# Patient Record
Sex: Female | Born: 1940
Health system: Southern US, Community
[De-identification: ages and names within clinical notes are randomized; demographics above are authoritative.]

## PROBLEM LIST (undated history)

## (undated) DIAGNOSIS — R42 Dizziness and giddiness: Secondary | ICD-10-CM

## (undated) DIAGNOSIS — J189 Pneumonia, unspecified organism: Secondary | ICD-10-CM

## (undated) DIAGNOSIS — R519 Headache, unspecified: Secondary | ICD-10-CM

## (undated) DIAGNOSIS — Z9289 Personal history of other medical treatment: Secondary | ICD-10-CM

## (undated) DIAGNOSIS — K922 Gastrointestinal hemorrhage, unspecified: Secondary | ICD-10-CM

## (undated) DIAGNOSIS — D649 Anemia, unspecified: Secondary | ICD-10-CM

## (undated) DIAGNOSIS — R413 Other amnesia: Secondary | ICD-10-CM

## (undated) DIAGNOSIS — I499 Cardiac arrhythmia, unspecified: Secondary | ICD-10-CM

## (undated) DIAGNOSIS — H9190 Unspecified hearing loss, unspecified ear: Secondary | ICD-10-CM

## (undated) DIAGNOSIS — E0590 Thyrotoxicosis, unspecified without thyrotoxic crisis or storm: Secondary | ICD-10-CM

## (undated) DIAGNOSIS — R55 Syncope and collapse: Secondary | ICD-10-CM

## (undated) DIAGNOSIS — K219 Gastro-esophageal reflux disease without esophagitis: Secondary | ICD-10-CM

## (undated) DIAGNOSIS — M1990 Unspecified osteoarthritis, unspecified site: Secondary | ICD-10-CM

## (undated) DIAGNOSIS — G4730 Sleep apnea, unspecified: Secondary | ICD-10-CM

## (undated) DIAGNOSIS — C439 Malignant melanoma of skin, unspecified: Secondary | ICD-10-CM

## (undated) DIAGNOSIS — R51 Headache: Secondary | ICD-10-CM

## (undated) DIAGNOSIS — E785 Hyperlipidemia, unspecified: Secondary | ICD-10-CM

## (undated) DIAGNOSIS — I10 Essential (primary) hypertension: Secondary | ICD-10-CM

## (undated) DIAGNOSIS — D62 Acute posthemorrhagic anemia: Secondary | ICD-10-CM

## (undated) HISTORY — DX: Syncope and collapse: R55

## (undated) HISTORY — PX: APPENDECTOMY: SHX54

## (undated) HISTORY — PX: ABDOMINAL HYSTERECTOMY: SHX81

## (undated) HISTORY — PX: TONSILLECTOMY: SUR1361

## (undated) HISTORY — DX: Sleep apnea, unspecified: G47.30

## (undated) HISTORY — PX: MELANOMA EXCISION: SHX5266

## (undated) HISTORY — PX: KNEE ARTHROSCOPY: SHX127

## (undated) HISTORY — DX: Essential (primary) hypertension: I10

## (undated) HISTORY — PX: NASAL SINUS SURGERY: SHX719

## (undated) HISTORY — DX: Gastrointestinal hemorrhage, unspecified: K92.2

## (undated) HISTORY — DX: Acute posthemorrhagic anemia: D62

## (undated) HISTORY — DX: Hyperlipidemia, unspecified: E78.5

## (undated) HISTORY — DX: Dizziness and giddiness: R42

## (undated) HISTORY — PX: CRANIECTOMY FOR EXCISION OF ACOUSTIC NEUROMA: SUR324

## (undated) HISTORY — DX: Other amnesia: R41.3

## (undated) HISTORY — DX: Unspecified hearing loss, unspecified ear: H91.90

---

## 1997-11-13 ENCOUNTER — Other Ambulatory Visit: Admission: RE | Admit: 1997-11-13 | Discharge: 1997-11-13 | Payer: Self-pay | Admitting: Family Medicine

## 1998-05-23 ENCOUNTER — Encounter: Payer: Self-pay | Admitting: Family Medicine

## 1998-05-23 ENCOUNTER — Ambulatory Visit (HOSPITAL_COMMUNITY): Admission: RE | Admit: 1998-05-23 | Discharge: 1998-05-23 | Payer: Self-pay | Admitting: Obstetrics & Gynecology

## 1999-04-03 ENCOUNTER — Other Ambulatory Visit: Admission: RE | Admit: 1999-04-03 | Discharge: 1999-04-03 | Payer: Self-pay | Admitting: Family Medicine

## 1999-08-14 ENCOUNTER — Ambulatory Visit (HOSPITAL_COMMUNITY): Admission: RE | Admit: 1999-08-14 | Discharge: 1999-08-14 | Payer: Self-pay | Admitting: Family Medicine

## 1999-08-14 ENCOUNTER — Encounter: Payer: Self-pay | Admitting: Family Medicine

## 1999-12-04 ENCOUNTER — Other Ambulatory Visit: Admission: RE | Admit: 1999-12-04 | Discharge: 1999-12-04 | Payer: Self-pay | Admitting: *Deleted

## 2000-08-04 ENCOUNTER — Ambulatory Visit (HOSPITAL_COMMUNITY): Admission: RE | Admit: 2000-08-04 | Discharge: 2000-08-04 | Payer: Self-pay | Admitting: Family Medicine

## 2001-04-14 ENCOUNTER — Other Ambulatory Visit: Admission: RE | Admit: 2001-04-14 | Discharge: 2001-04-14 | Payer: Self-pay | Admitting: Family Medicine

## 2002-03-27 ENCOUNTER — Ambulatory Visit (HOSPITAL_COMMUNITY): Admission: RE | Admit: 2002-03-27 | Discharge: 2002-03-27 | Payer: Self-pay | Admitting: Gastroenterology

## 2002-03-27 ENCOUNTER — Encounter (INDEPENDENT_AMBULATORY_CARE_PROVIDER_SITE_OTHER): Payer: Self-pay

## 2002-07-14 ENCOUNTER — Other Ambulatory Visit: Admission: RE | Admit: 2002-07-14 | Discharge: 2002-07-14 | Payer: Self-pay | Admitting: Family Medicine

## 2002-07-28 ENCOUNTER — Ambulatory Visit (HOSPITAL_COMMUNITY): Admission: RE | Admit: 2002-07-28 | Discharge: 2002-07-28 | Payer: Self-pay | Admitting: Family Medicine

## 2002-07-28 ENCOUNTER — Encounter: Payer: Self-pay | Admitting: Family Medicine

## 2002-08-08 ENCOUNTER — Encounter: Admission: RE | Admit: 2002-08-08 | Discharge: 2002-08-08 | Payer: Self-pay | Admitting: Family Medicine

## 2002-08-08 ENCOUNTER — Encounter: Payer: Self-pay | Admitting: Family Medicine

## 2003-11-26 ENCOUNTER — Ambulatory Visit (HOSPITAL_COMMUNITY): Admission: RE | Admit: 2003-11-26 | Discharge: 2003-11-26 | Payer: Self-pay | Admitting: Family Medicine

## 2004-07-18 ENCOUNTER — Encounter: Admission: RE | Admit: 2004-07-18 | Discharge: 2004-07-18 | Payer: Self-pay | Admitting: Family Medicine

## 2004-10-24 ENCOUNTER — Other Ambulatory Visit: Admission: RE | Admit: 2004-10-24 | Discharge: 2004-10-24 | Payer: Self-pay | Admitting: Family Medicine

## 2005-08-19 ENCOUNTER — Encounter: Admission: RE | Admit: 2005-08-19 | Discharge: 2005-08-19 | Payer: Self-pay | Admitting: Emergency Medicine

## 2005-08-24 ENCOUNTER — Encounter: Admission: RE | Admit: 2005-08-24 | Discharge: 2005-08-24 | Payer: Self-pay | Admitting: Emergency Medicine

## 2006-01-01 ENCOUNTER — Emergency Department (HOSPITAL_COMMUNITY): Admission: EM | Admit: 2006-01-01 | Discharge: 2006-01-01 | Payer: Self-pay | Admitting: Emergency Medicine

## 2006-09-07 ENCOUNTER — Encounter: Admission: RE | Admit: 2006-09-07 | Discharge: 2006-09-07 | Payer: Self-pay | Admitting: Emergency Medicine

## 2007-04-25 ENCOUNTER — Encounter: Admission: RE | Admit: 2007-04-25 | Discharge: 2007-04-25 | Payer: Self-pay | Admitting: Internal Medicine

## 2007-05-19 ENCOUNTER — Encounter (INDEPENDENT_AMBULATORY_CARE_PROVIDER_SITE_OTHER): Payer: Self-pay | Admitting: Gastroenterology

## 2007-05-19 ENCOUNTER — Ambulatory Visit (HOSPITAL_COMMUNITY): Admission: RE | Admit: 2007-05-19 | Discharge: 2007-05-19 | Payer: Self-pay | Admitting: Gastroenterology

## 2007-10-17 ENCOUNTER — Encounter: Admission: RE | Admit: 2007-10-17 | Discharge: 2007-10-17 | Payer: Self-pay | Admitting: Internal Medicine

## 2008-06-12 ENCOUNTER — Encounter: Admission: RE | Admit: 2008-06-12 | Discharge: 2008-06-12 | Payer: Self-pay | Admitting: Endocrinology

## 2008-12-14 ENCOUNTER — Encounter: Admission: RE | Admit: 2008-12-14 | Discharge: 2008-12-14 | Payer: Self-pay | Admitting: Internal Medicine

## 2010-02-28 ENCOUNTER — Encounter: Admission: RE | Admit: 2010-02-28 | Discharge: 2010-02-28 | Payer: Self-pay | Admitting: Internal Medicine

## 2010-08-26 NOTE — Op Note (Signed)
NAMERUFINA, KIMERY                  ACCOUNT NO.:  [id number]   MEDICAL RECORD NO.:  [id number]          PATIENT TYPE:  AMB   LOCATION:  ENDO                         FACILITY:  [facility]   PHYSICIAN:  Petra Kuba, M.D.    DATE OF BIRTH:  10/27/1940   DATE OF PROCEDURE:  05/19/2007  DATE OF DISCHARGE:                               OPERATIVE REPORT   PROCEDURE:  Esophagogastroduodenoscopy.   INDICATIONS FOR PROCEDURE:  Patient with reflux, gas and bloating.  Rule  out Barrett's or other upper tract abnormalities.  Consent was signed  prior to any premeds given after risks, benefits, methods, and options  were thoroughly discussed multiple times in the past.   MEDICATIONS USED:  Per anesthesia, a combination of fentanyl, Diprivan,  and Versed IV.   PROCEDURE:  The video endoscope was inserted by direct vision.  The  esophagus was normal.  She did have a tiny hiatal hernia.  The scope  passed into the stomach.  Multiple insignificant polyps were seen, some  of which were biopsied at the end of the procedure.  They did involve  the greater and lesser curve with some in the cardia and fundus, as  well.  The scope was advanced through a normal antrum, normal pylorus,  into a normal duodenum bulb and around the C-loop to a normal second  portion of the duodenum.  No abnormalities were seen.  The scope was  withdrawn back to the bulb and a good look there ruled out abnormalities  in this location.  The scope was withdrawn back to the stomach and  retroflexed.  The cardia, fundus, angularis, lesser and greater curve  were evaluated on retroflex and then straight visualization.  Other than  the polyps mentioned above, some of which were cold biopsied at this  time, no other abnormalities were seen.  The air was suctioned, the  scope was slowly withdrawn.  Again, a good luck at the esophagus was  normal.  The scope was removed.  The patient tolerated the procedure  well.  There was no obvious  immediate complication.   ENDOSCOPIC DIAGNOSIS:  1. Tiny hiatal hernia.  2. Multiple gastric polyps, doubt significant, some of which were      biopsied.  3. Otherwise, normal EGD.   PLAN:  Await pathology.  Will follow up p.r.n. or in two months to  recheck symptoms, review workup, and make sure no further workup plans  are needed.           ______________________________  Petra Kuba, M.D.     MEM/MEDQ  D:  05/19/2007  T:  05/19/2007  Job:  [id number]   cc:   Soyla Murphy. Renne Crigler, M.D.  Fax: [phone]

## 2010-08-26 NOTE — Op Note (Signed)
Cynthia [name], [name]                  ACCOUNT NO.:  [id number]   MEDICAL RECORD NO.:  [id number]          PATIENT TYPE:  AMB   LOCATION:  ENDO                         FACILITY:  [facility]   PHYSICIAN:  Petra Kuba, M.D.    DATE OF BIRTH:  Aug 26, 1940   DATE OF PROCEDURE:  05/19/2007  DATE OF DISCHARGE:                               OPERATIVE REPORT   PROCEDURE:  Colonoscopy.   INDICATIONS FOR PROCEDURE:  Patient with a history of colon polyps, due  for repeat screening.  Consent was signed after risks, benefits,  methods, and options were thoroughly discussed multiple times in the  past.   MEDICATIONS USED:  Per anesthesia, a combination of Diprivan, Versed,  and fentanyl, I believe.   PROCEDURE IN DETAIL:  Rectal inspection was pertinent for small external  hemorrhoids.  Digital exam was negative.  The pediatric video  colonoscope was inserted and with some difficulty due to a tortuous long  looping colon, with rolling her on her back and various abdominal  pressures, we were able to advance to the cecum.  On insertion in the  mid sigmoid, a small polyp was seen which was cold biopsied x2.  The  cecum was identified by the appendiceal orifice and the ileocecal valve.  Other than a rare left sided diverticula, no other abnormalities were  seen on insertion.  The scope was inserted a short ways in the terminal  ileum which was normal.  Photo documentation was obtained.  Unfortunately, at the end of the procedure, the picture mechanism was  broke so no pictures were obtained.  The scope was slowly withdrawn.  The prep was adequate, there was minimal liquid stool that required  washing and suctioning.  On slow withdrawal through the colon, the  cecum, ascending, and transverse were normal.  In the more proximal  descending, a questionable tiny polyp was seen and was cold biopsied x2  and put in a separate container.  The scope was further withdrawn.  When  we fell back around a tortuous  loop, we did readvance around the curves  to decrease chances of missing things.  Other than a rare diverticula,  no other abnormalities were seen as we slowly withdrew back to the  rectum.  On withdrawal, we did not see the biopsy that had been biopsied  in the sigmoid.  Once back in the rectum, anorectal pull through and  retroflexion confirmed some small hemorrhoids.  The scope was  straightened and readvanced a short ways up the left side of the colon.  The air was suctioned, the scope was removed.  The patient tolerated the  procedure adequately.  There was no obvious immediate complications.   ENDOSCOPIC DIAGNOSIS:  1. Internal and external small hemorrhoids.  2. Rare left sided diverticula.  3. One small mid sigmoid polyp and a questionable descending polyp,      cold biopsied.  4. Otherwise, within normal limits to the terminal ileum.   PLAN:  Await pathology.  Continue workup with an EGD.  Probably will  repeat screening in five years.  ______________________________  Petra Kuba, M.D.     MEM/MEDQ  D:  05/19/2007  T:  05/19/2007  Job:  [id number]   cc:   Soyla Murphy. Renne Crigler, M.D.  Fax: [phone]

## 2010-08-29 NOTE — Op Note (Signed)
NAME:  Cynthia [name], [name]                            ACCOUNT NO.:  [id number]   MEDICAL RECORD NO.:  [id number]                   PATIENT TYPE:  AMB   LOCATION:  ENDO                                 FACILITY:  [facility]   PHYSICIAN:  Petra Kuba, M.D.                 DATE OF BIRTH:  04-09-1941   DATE OF PROCEDURE:  03/27/2002  DATE OF DISCHARGE:                                 OPERATIVE REPORT   PROCEDURE:  Colonoscopy.   INDICATIONS FOR PROCEDURE:  A patient with some lower abdominal cramps and  bloating due for colonic screening.   Consent was signed after risks, benefits, methods, and options were  thoroughly discussed in the office.   MEDICINES USED:  Demerol 80, Versed 8.   DESCRIPTION OF PROCEDURE:  Rectal inspection was pertinent for external  hemorrhoids, small. Digital exam was negative. The pediatric video  adjustable colonoscope was inserted and with lots of difficulty due to a  tortuous looking colon was able to be advanced to the cecum. This did  require rolling her on her back and then on her right side and various  abdominal pressures. On insertion, no significant abnormalities were seen.  The cecum was identified by the appendiceal orifice and the ileocecal valve.  In fact, the scope was inserted into the terminal ileum which was normal.  Photo documentation was obtained. The scope was slowly withdrawn. The prep  was adequate. There was minimal liquid stool that required washing and  suctioning. On slow withdrawal through the colon in the cecum, a tiny polyp  was seen and was cold biopsied x3 and put in the first container. There were  three other right sided tiny to small polyps. One at the hepatic flexure,  two in the transverse which were all hot biopsied x1 or 2 and put in the  first container. As the scope was withdrawn around the left side of the  colon, an occasional rare early diverticula were seen and 3 o4 sigmoid tiny  polyps were seen and were all hot  biopsied x 1 or 2 and put in a second  container. No other abnormalities were seen we slowly withdrew back to the  rectum. Once back in the rectum, the scope was retroflexed pertinent for  some tiny internal hemorrhoids. The scope straightened and readvanced a  short ways up the left side of the colon, air was suctioned, scope removed.  The patient tolerated the procedure adequately. There was no obvious or  immediate complications.   ENDOSCOPIC DIAGNOSIS:  1. Internal and external small hemorrhoids.  2. Rare left sided early diverticula.  3. Left and right tiny to small polyps hot biopsied.  4. Several tiny polyps cold biopsied.  5. Otherwise within normal limits to the cecum and the terminal ileum.    PLAN:  Await pathology to determine future colonic screening. Happy to see  back p.r.n.; otherwise,  return care to Dr. Andrey Campanile for the customary health  care maintenance to include yearly rectals and guaiacs and may want to try  some antispasmodics if her symptoms continue.                                               Petra Kuba, M.D.    MEM/MEDQ  D:  03/27/2002  T:  03/27/2002  Job:  [id number]   cc:   Vale Haven. Andrey Campanile, M.D.  [address]  [place]  [place] [address]  Fax: [phone]

## 2011-01-02 LAB — CBC
HCT: 37.7
Hemoglobin: 13
MCHC: 34.4
MCV: 91.9
Platelets: 188
RBC: 4.1
RDW: 12.8
WBC: 6

## 2011-01-02 LAB — BASIC METABOLIC PANEL
CO2: 28
Chloride: 106
Creatinine, Ser: 0.66
GFR calc Af Amer: >60
GFR calc non Af Amer: >60

## 2011-03-19 ENCOUNTER — Other Ambulatory Visit: Payer: Self-pay | Admitting: Internal Medicine

## 2011-03-19 DIAGNOSIS — Z1231 Encounter for screening mammogram for malignant neoplasm of breast: Secondary | ICD-10-CM

## 2011-04-16 DIAGNOSIS — R413 Other amnesia: Secondary | ICD-10-CM | POA: Diagnosis not present

## 2011-04-20 DIAGNOSIS — N390 Urinary tract infection, site not specified: Secondary | ICD-10-CM | POA: Diagnosis not present

## 2011-04-20 DIAGNOSIS — Z0000 Encounter for general adult medical examination without abnormal findings: Secondary | ICD-10-CM | POA: Diagnosis not present

## 2011-04-20 DIAGNOSIS — M159 Polyosteoarthritis, unspecified: Secondary | ICD-10-CM | POA: Diagnosis not present

## 2011-04-20 DIAGNOSIS — E782 Mixed hyperlipidemia: Secondary | ICD-10-CM | POA: Diagnosis not present

## 2011-04-22 ENCOUNTER — Ambulatory Visit: Payer: Self-pay

## 2011-04-28 DIAGNOSIS — R413 Other amnesia: Secondary | ICD-10-CM | POA: Diagnosis not present

## 2011-05-19 ENCOUNTER — Ambulatory Visit
Admission: RE | Admit: 2011-05-19 | Discharge: 2011-05-19 | Disposition: A | Discharge status: Home / Self Care | Payer: Medicare Other | Source: Ambulatory Visit | Attending: Internal Medicine | Admitting: Internal Medicine

## 2011-05-19 DIAGNOSIS — Z1231 Encounter for screening mammogram for malignant neoplasm of breast: Secondary | ICD-10-CM

## 2011-05-26 DIAGNOSIS — M25579 Pain in unspecified ankle and joints of unspecified foot: Secondary | ICD-10-CM | POA: Diagnosis not present

## 2011-06-18 DIAGNOSIS — J209 Acute bronchitis, unspecified: Secondary | ICD-10-CM | POA: Diagnosis not present

## 2011-06-19 DIAGNOSIS — D235 Other benign neoplasm of skin of trunk: Secondary | ICD-10-CM | POA: Diagnosis not present

## 2011-06-19 DIAGNOSIS — Z85820 Personal history of malignant melanoma of skin: Secondary | ICD-10-CM | POA: Diagnosis not present

## 2011-06-25 DIAGNOSIS — J209 Acute bronchitis, unspecified: Secondary | ICD-10-CM | POA: Diagnosis not present

## 2011-07-02 DIAGNOSIS — J45909 Unspecified asthma, uncomplicated: Secondary | ICD-10-CM | POA: Diagnosis not present

## 2011-07-08 DIAGNOSIS — J209 Acute bronchitis, unspecified: Secondary | ICD-10-CM | POA: Diagnosis not present

## 2011-07-08 DIAGNOSIS — M25579 Pain in unspecified ankle and joints of unspecified foot: Secondary | ICD-10-CM | POA: Diagnosis not present

## 2011-07-21 DIAGNOSIS — M2550 Pain in unspecified joint: Secondary | ICD-10-CM | POA: Diagnosis not present

## 2011-07-21 DIAGNOSIS — R413 Other amnesia: Secondary | ICD-10-CM | POA: Diagnosis not present

## 2011-07-22 DIAGNOSIS — M25579 Pain in unspecified ankle and joints of unspecified foot: Secondary | ICD-10-CM | POA: Diagnosis not present

## 2011-07-22 DIAGNOSIS — R5383 Other fatigue: Secondary | ICD-10-CM | POA: Diagnosis not present

## 2011-07-22 DIAGNOSIS — I10 Essential (primary) hypertension: Secondary | ICD-10-CM | POA: Diagnosis not present

## 2011-07-22 DIAGNOSIS — R5381 Other malaise: Secondary | ICD-10-CM | POA: Diagnosis not present

## 2011-07-22 DIAGNOSIS — E7800 Pure hypercholesterolemia, unspecified: Secondary | ICD-10-CM | POA: Diagnosis not present

## 2011-07-22 DIAGNOSIS — N390 Urinary tract infection, site not specified: Secondary | ICD-10-CM | POA: Diagnosis not present

## 2011-07-22 DIAGNOSIS — Z79899 Other long term (current) drug therapy: Secondary | ICD-10-CM | POA: Diagnosis not present

## 2011-07-27 DIAGNOSIS — R5381 Other malaise: Secondary | ICD-10-CM | POA: Diagnosis not present

## 2011-07-27 DIAGNOSIS — G4710 Hypersomnia, unspecified: Secondary | ICD-10-CM | POA: Diagnosis not present

## 2011-07-27 DIAGNOSIS — R5383 Other fatigue: Secondary | ICD-10-CM | POA: Diagnosis not present

## 2011-07-27 DIAGNOSIS — G4733 Obstructive sleep apnea (adult) (pediatric): Secondary | ICD-10-CM | POA: Diagnosis not present

## 2011-07-27 DIAGNOSIS — E039 Hypothyroidism, unspecified: Secondary | ICD-10-CM | POA: Diagnosis not present

## 2011-08-03 DIAGNOSIS — E039 Hypothyroidism, unspecified: Secondary | ICD-10-CM | POA: Diagnosis not present

## 2011-08-03 DIAGNOSIS — G4733 Obstructive sleep apnea (adult) (pediatric): Secondary | ICD-10-CM | POA: Diagnosis not present

## 2011-08-03 DIAGNOSIS — R5381 Other malaise: Secondary | ICD-10-CM | POA: Diagnosis not present

## 2011-08-20 DIAGNOSIS — M25579 Pain in unspecified ankle and joints of unspecified foot: Secondary | ICD-10-CM | POA: Diagnosis not present

## 2011-08-26 DIAGNOSIS — M19079 Primary osteoarthritis, unspecified ankle and foot: Secondary | ICD-10-CM | POA: Diagnosis not present

## 2011-09-03 DIAGNOSIS — M25579 Pain in unspecified ankle and joints of unspecified foot: Secondary | ICD-10-CM | POA: Diagnosis not present

## 2011-09-08 DIAGNOSIS — E039 Hypothyroidism, unspecified: Secondary | ICD-10-CM | POA: Diagnosis not present

## 2011-09-10 DIAGNOSIS — R5383 Other fatigue: Secondary | ICD-10-CM | POA: Diagnosis not present

## 2011-09-10 DIAGNOSIS — E039 Hypothyroidism, unspecified: Secondary | ICD-10-CM | POA: Diagnosis not present

## 2011-09-10 DIAGNOSIS — R5381 Other malaise: Secondary | ICD-10-CM | POA: Diagnosis not present

## 2011-09-10 DIAGNOSIS — G4733 Obstructive sleep apnea (adult) (pediatric): Secondary | ICD-10-CM | POA: Diagnosis not present

## 2011-09-14 DIAGNOSIS — Z0000 Encounter for general adult medical examination without abnormal findings: Secondary | ICD-10-CM | POA: Diagnosis not present

## 2011-10-16 DIAGNOSIS — M25579 Pain in unspecified ankle and joints of unspecified foot: Secondary | ICD-10-CM | POA: Diagnosis not present

## 2011-10-20 DIAGNOSIS — R413 Other amnesia: Secondary | ICD-10-CM | POA: Diagnosis not present

## 2011-10-20 DIAGNOSIS — M2550 Pain in unspecified joint: Secondary | ICD-10-CM | POA: Diagnosis not present

## 2011-11-20 DIAGNOSIS — M76829 Posterior tibial tendinitis, unspecified leg: Secondary | ICD-10-CM | POA: Diagnosis not present

## 2011-11-20 DIAGNOSIS — M722 Plantar fascial fibromatosis: Secondary | ICD-10-CM | POA: Diagnosis not present

## 2011-11-27 DIAGNOSIS — H40059 Ocular hypertension, unspecified eye: Secondary | ICD-10-CM | POA: Diagnosis not present

## 2011-11-30 DIAGNOSIS — E039 Hypothyroidism, unspecified: Secondary | ICD-10-CM | POA: Diagnosis not present

## 2011-11-30 DIAGNOSIS — N390 Urinary tract infection, site not specified: Secondary | ICD-10-CM | POA: Diagnosis not present

## 2011-11-30 DIAGNOSIS — I10 Essential (primary) hypertension: Secondary | ICD-10-CM | POA: Diagnosis not present

## 2011-11-30 DIAGNOSIS — E7800 Pure hypercholesterolemia, unspecified: Secondary | ICD-10-CM | POA: Diagnosis not present

## 2011-12-07 DIAGNOSIS — E7800 Pure hypercholesterolemia, unspecified: Secondary | ICD-10-CM | POA: Diagnosis not present

## 2011-12-07 DIAGNOSIS — Z0000 Encounter for general adult medical examination without abnormal findings: Secondary | ICD-10-CM | POA: Diagnosis not present

## 2011-12-07 DIAGNOSIS — D649 Anemia, unspecified: Secondary | ICD-10-CM | POA: Diagnosis not present

## 2011-12-07 DIAGNOSIS — I8390 Asymptomatic varicose veins of unspecified lower extremity: Secondary | ICD-10-CM | POA: Diagnosis not present

## 2011-12-07 DIAGNOSIS — K219 Gastro-esophageal reflux disease without esophagitis: Secondary | ICD-10-CM | POA: Diagnosis not present

## 2011-12-07 DIAGNOSIS — R51 Headache: Secondary | ICD-10-CM | POA: Diagnosis not present

## 2011-12-18 DIAGNOSIS — Z85820 Personal history of malignant melanoma of skin: Secondary | ICD-10-CM | POA: Diagnosis not present

## 2011-12-18 DIAGNOSIS — D235 Other benign neoplasm of skin of trunk: Secondary | ICD-10-CM | POA: Diagnosis not present

## 2011-12-28 DIAGNOSIS — H40059 Ocular hypertension, unspecified eye: Secondary | ICD-10-CM | POA: Diagnosis not present

## 2012-01-04 DIAGNOSIS — J329 Chronic sinusitis, unspecified: Secondary | ICD-10-CM | POA: Diagnosis not present

## 2012-02-12 DIAGNOSIS — N3020 Other chronic cystitis without hematuria: Secondary | ICD-10-CM | POA: Diagnosis not present

## 2012-02-17 DIAGNOSIS — N3020 Other chronic cystitis without hematuria: Secondary | ICD-10-CM | POA: Diagnosis not present

## 2012-02-26 DIAGNOSIS — N3000 Acute cystitis without hematuria: Secondary | ICD-10-CM | POA: Diagnosis not present

## 2012-03-23 DIAGNOSIS — M76829 Posterior tibial tendinitis, unspecified leg: Secondary | ICD-10-CM | POA: Diagnosis not present

## 2012-04-12 DIAGNOSIS — R110 Nausea: Secondary | ICD-10-CM | POA: Diagnosis not present

## 2012-04-12 DIAGNOSIS — R143 Flatulence: Secondary | ICD-10-CM | POA: Diagnosis not present

## 2012-04-12 DIAGNOSIS — R141 Gas pain: Secondary | ICD-10-CM | POA: Diagnosis not present

## 2012-04-25 DIAGNOSIS — R413 Other amnesia: Secondary | ICD-10-CM | POA: Diagnosis not present

## 2012-04-25 DIAGNOSIS — M2550 Pain in unspecified joint: Secondary | ICD-10-CM | POA: Diagnosis not present

## 2012-06-07 DIAGNOSIS — D649 Anemia, unspecified: Secondary | ICD-10-CM | POA: Diagnosis not present

## 2012-06-07 DIAGNOSIS — Z79899 Other long term (current) drug therapy: Secondary | ICD-10-CM | POA: Diagnosis not present

## 2012-06-07 DIAGNOSIS — E7800 Pure hypercholesterolemia, unspecified: Secondary | ICD-10-CM | POA: Diagnosis not present

## 2012-06-09 DIAGNOSIS — Z79899 Other long term (current) drug therapy: Secondary | ICD-10-CM | POA: Diagnosis not present

## 2012-06-09 DIAGNOSIS — E7800 Pure hypercholesterolemia, unspecified: Secondary | ICD-10-CM | POA: Diagnosis not present

## 2012-06-09 DIAGNOSIS — R141 Gas pain: Secondary | ICD-10-CM | POA: Diagnosis not present

## 2012-06-09 DIAGNOSIS — R142 Eructation: Secondary | ICD-10-CM | POA: Diagnosis not present

## 2012-06-28 DIAGNOSIS — M722 Plantar fascial fibromatosis: Secondary | ICD-10-CM | POA: Diagnosis not present

## 2012-07-11 DIAGNOSIS — M722 Plantar fascial fibromatosis: Secondary | ICD-10-CM | POA: Diagnosis not present

## 2012-07-11 DIAGNOSIS — M76829 Posterior tibial tendinitis, unspecified leg: Secondary | ICD-10-CM | POA: Diagnosis not present

## 2012-07-14 DIAGNOSIS — M722 Plantar fascial fibromatosis: Secondary | ICD-10-CM | POA: Diagnosis not present

## 2012-07-14 DIAGNOSIS — M76829 Posterior tibial tendinitis, unspecified leg: Secondary | ICD-10-CM | POA: Diagnosis not present

## 2012-07-18 DIAGNOSIS — M76829 Posterior tibial tendinitis, unspecified leg: Secondary | ICD-10-CM | POA: Diagnosis not present

## 2012-07-18 DIAGNOSIS — M722 Plantar fascial fibromatosis: Secondary | ICD-10-CM | POA: Diagnosis not present

## 2012-07-25 DIAGNOSIS — M76829 Posterior tibial tendinitis, unspecified leg: Secondary | ICD-10-CM | POA: Diagnosis not present

## 2012-07-25 DIAGNOSIS — M19079 Primary osteoarthritis, unspecified ankle and foot: Secondary | ICD-10-CM | POA: Diagnosis not present

## 2012-07-25 DIAGNOSIS — M722 Plantar fascial fibromatosis: Secondary | ICD-10-CM | POA: Diagnosis not present

## 2012-07-27 DIAGNOSIS — R142 Eructation: Secondary | ICD-10-CM | POA: Diagnosis not present

## 2012-07-27 DIAGNOSIS — Z86010 Personal history of colonic polyps: Secondary | ICD-10-CM | POA: Diagnosis not present

## 2012-07-27 DIAGNOSIS — K449 Diaphragmatic hernia without obstruction or gangrene: Secondary | ICD-10-CM | POA: Diagnosis not present

## 2012-07-27 DIAGNOSIS — R141 Gas pain: Secondary | ICD-10-CM | POA: Diagnosis not present

## 2012-07-27 DIAGNOSIS — K219 Gastro-esophageal reflux disease without esophagitis: Secondary | ICD-10-CM | POA: Diagnosis not present

## 2012-07-27 DIAGNOSIS — Z09 Encounter for follow-up examination after completed treatment for conditions other than malignant neoplasm: Secondary | ICD-10-CM | POA: Diagnosis not present

## 2012-08-08 DIAGNOSIS — M19079 Primary osteoarthritis, unspecified ankle and foot: Secondary | ICD-10-CM | POA: Diagnosis not present

## 2012-08-08 DIAGNOSIS — M76829 Posterior tibial tendinitis, unspecified leg: Secondary | ICD-10-CM | POA: Diagnosis not present

## 2012-08-08 DIAGNOSIS — M24819 Other specific joint derangements of unspecified shoulder, not elsewhere classified: Secondary | ICD-10-CM | POA: Diagnosis not present

## 2012-08-18 ENCOUNTER — Other Ambulatory Visit: Payer: Self-pay

## 2012-08-18 DIAGNOSIS — Z1231 Encounter for screening mammogram for malignant neoplasm of breast: Secondary | ICD-10-CM

## 2012-08-25 DIAGNOSIS — M2550 Pain in unspecified joint: Secondary | ICD-10-CM | POA: Diagnosis not present

## 2012-08-25 DIAGNOSIS — R413 Other amnesia: Secondary | ICD-10-CM | POA: Diagnosis not present

## 2012-09-02 DIAGNOSIS — N3020 Other chronic cystitis without hematuria: Secondary | ICD-10-CM | POA: Diagnosis not present

## 2012-09-08 DIAGNOSIS — D235 Other benign neoplasm of skin of trunk: Secondary | ICD-10-CM | POA: Diagnosis not present

## 2012-09-08 DIAGNOSIS — L570 Actinic keratosis: Secondary | ICD-10-CM | POA: Diagnosis not present

## 2012-09-08 DIAGNOSIS — D1801 Hemangioma of skin and subcutaneous tissue: Secondary | ICD-10-CM | POA: Diagnosis not present

## 2012-09-08 DIAGNOSIS — Z85820 Personal history of malignant melanoma of skin: Secondary | ICD-10-CM | POA: Diagnosis not present

## 2012-09-20 ENCOUNTER — Ambulatory Visit
Admission: RE | Admit: 2012-09-20 | Discharge: 2012-09-20 | Disposition: A | Discharge status: Home / Self Care | Payer: Medicare Other | Source: Ambulatory Visit

## 2012-09-20 DIAGNOSIS — Z1231 Encounter for screening mammogram for malignant neoplasm of breast: Secondary | ICD-10-CM

## 2012-10-20 DIAGNOSIS — R413 Other amnesia: Secondary | ICD-10-CM | POA: Diagnosis not present

## 2012-10-25 DIAGNOSIS — M25519 Pain in unspecified shoulder: Secondary | ICD-10-CM | POA: Diagnosis not present

## 2012-10-25 DIAGNOSIS — E049 Nontoxic goiter, unspecified: Secondary | ICD-10-CM | POA: Diagnosis not present

## 2012-10-25 DIAGNOSIS — I10 Essential (primary) hypertension: Secondary | ICD-10-CM | POA: Diagnosis not present

## 2012-10-25 DIAGNOSIS — Z006 Encounter for examination for normal comparison and control in clinical research program: Secondary | ICD-10-CM | POA: Diagnosis not present

## 2012-10-25 DIAGNOSIS — E7800 Pure hypercholesterolemia, unspecified: Secondary | ICD-10-CM | POA: Diagnosis not present

## 2012-11-02 DIAGNOSIS — M542 Cervicalgia: Secondary | ICD-10-CM | POA: Diagnosis not present

## 2012-11-03 DIAGNOSIS — R413 Other amnesia: Secondary | ICD-10-CM | POA: Diagnosis not present

## 2012-11-04 DIAGNOSIS — M542 Cervicalgia: Secondary | ICD-10-CM | POA: Diagnosis not present

## 2012-11-04 DIAGNOSIS — M47812 Spondylosis without myelopathy or radiculopathy, cervical region: Secondary | ICD-10-CM | POA: Diagnosis not present

## 2012-11-07 DIAGNOSIS — M542 Cervicalgia: Secondary | ICD-10-CM | POA: Diagnosis not present

## 2012-11-07 DIAGNOSIS — M47812 Spondylosis without myelopathy or radiculopathy, cervical region: Secondary | ICD-10-CM | POA: Diagnosis not present

## 2012-11-09 DIAGNOSIS — M47812 Spondylosis without myelopathy or radiculopathy, cervical region: Secondary | ICD-10-CM | POA: Diagnosis not present

## 2012-11-09 DIAGNOSIS — M542 Cervicalgia: Secondary | ICD-10-CM | POA: Diagnosis not present

## 2012-11-10 DIAGNOSIS — R69 Illness, unspecified: Secondary | ICD-10-CM | POA: Diagnosis not present

## 2012-11-16 DIAGNOSIS — M47812 Spondylosis without myelopathy or radiculopathy, cervical region: Secondary | ICD-10-CM | POA: Diagnosis not present

## 2012-11-16 DIAGNOSIS — M542 Cervicalgia: Secondary | ICD-10-CM | POA: Diagnosis not present

## 2012-11-17 DIAGNOSIS — M47812 Spondylosis without myelopathy or radiculopathy, cervical region: Secondary | ICD-10-CM | POA: Diagnosis not present

## 2012-11-17 DIAGNOSIS — M542 Cervicalgia: Secondary | ICD-10-CM | POA: Diagnosis not present

## 2012-11-23 DIAGNOSIS — M542 Cervicalgia: Secondary | ICD-10-CM | POA: Diagnosis not present

## 2012-11-23 DIAGNOSIS — M47812 Spondylosis without myelopathy or radiculopathy, cervical region: Secondary | ICD-10-CM | POA: Diagnosis not present

## 2012-11-25 DIAGNOSIS — M542 Cervicalgia: Secondary | ICD-10-CM | POA: Diagnosis not present

## 2012-11-25 DIAGNOSIS — M47812 Spondylosis without myelopathy or radiculopathy, cervical region: Secondary | ICD-10-CM | POA: Diagnosis not present

## 2012-11-28 DIAGNOSIS — M47812 Spondylosis without myelopathy or radiculopathy, cervical region: Secondary | ICD-10-CM | POA: Diagnosis not present

## 2012-11-28 DIAGNOSIS — M542 Cervicalgia: Secondary | ICD-10-CM | POA: Diagnosis not present

## 2012-12-01 DIAGNOSIS — S43499A Other sprain of unspecified shoulder joint, initial encounter: Secondary | ICD-10-CM | POA: Diagnosis not present

## 2012-12-01 DIAGNOSIS — M47812 Spondylosis without myelopathy or radiculopathy, cervical region: Secondary | ICD-10-CM | POA: Diagnosis not present

## 2012-12-01 DIAGNOSIS — R413 Other amnesia: Secondary | ICD-10-CM | POA: Diagnosis not present

## 2012-12-01 DIAGNOSIS — M542 Cervicalgia: Secondary | ICD-10-CM | POA: Diagnosis not present

## 2012-12-05 DIAGNOSIS — M542 Cervicalgia: Secondary | ICD-10-CM | POA: Diagnosis not present

## 2012-12-05 DIAGNOSIS — M47812 Spondylosis without myelopathy or radiculopathy, cervical region: Secondary | ICD-10-CM | POA: Diagnosis not present

## 2012-12-08 DIAGNOSIS — Z79899 Other long term (current) drug therapy: Secondary | ICD-10-CM | POA: Diagnosis not present

## 2012-12-08 DIAGNOSIS — M542 Cervicalgia: Secondary | ICD-10-CM | POA: Diagnosis not present

## 2012-12-08 DIAGNOSIS — Z0000 Encounter for general adult medical examination without abnormal findings: Secondary | ICD-10-CM | POA: Diagnosis not present

## 2012-12-08 DIAGNOSIS — I10 Essential (primary) hypertension: Secondary | ICD-10-CM | POA: Diagnosis not present

## 2012-12-08 DIAGNOSIS — M47812 Spondylosis without myelopathy or radiculopathy, cervical region: Secondary | ICD-10-CM | POA: Diagnosis not present

## 2012-12-08 DIAGNOSIS — E7800 Pure hypercholesterolemia, unspecified: Secondary | ICD-10-CM | POA: Diagnosis not present

## 2012-12-08 DIAGNOSIS — E039 Hypothyroidism, unspecified: Secondary | ICD-10-CM | POA: Diagnosis not present

## 2012-12-14 DIAGNOSIS — M47812 Spondylosis without myelopathy or radiculopathy, cervical region: Secondary | ICD-10-CM | POA: Diagnosis not present

## 2012-12-14 DIAGNOSIS — M542 Cervicalgia: Secondary | ICD-10-CM | POA: Diagnosis not present

## 2012-12-15 DIAGNOSIS — M542 Cervicalgia: Secondary | ICD-10-CM | POA: Diagnosis not present

## 2012-12-15 DIAGNOSIS — M47812 Spondylosis without myelopathy or radiculopathy, cervical region: Secondary | ICD-10-CM | POA: Diagnosis not present

## 2013-01-10 DIAGNOSIS — M2550 Pain in unspecified joint: Secondary | ICD-10-CM | POA: Diagnosis not present

## 2013-01-10 DIAGNOSIS — Z1212 Encounter for screening for malignant neoplasm of rectum: Secondary | ICD-10-CM | POA: Diagnosis not present

## 2013-01-10 DIAGNOSIS — K219 Gastro-esophageal reflux disease without esophagitis: Secondary | ICD-10-CM | POA: Diagnosis not present

## 2013-01-10 DIAGNOSIS — Z79899 Other long term (current) drug therapy: Secondary | ICD-10-CM | POA: Diagnosis not present

## 2013-01-10 DIAGNOSIS — M159 Polyosteoarthritis, unspecified: Secondary | ICD-10-CM | POA: Diagnosis not present

## 2013-01-10 DIAGNOSIS — H9190 Unspecified hearing loss, unspecified ear: Secondary | ICD-10-CM | POA: Diagnosis not present

## 2013-01-20 DIAGNOSIS — M62838 Other muscle spasm: Secondary | ICD-10-CM | POA: Diagnosis not present

## 2013-01-24 DIAGNOSIS — Z23 Encounter for immunization: Secondary | ICD-10-CM | POA: Diagnosis not present

## 2013-01-24 DIAGNOSIS — M62838 Other muscle spasm: Secondary | ICD-10-CM | POA: Diagnosis not present

## 2013-01-25 DIAGNOSIS — M76829 Posterior tibial tendinitis, unspecified leg: Secondary | ICD-10-CM | POA: Diagnosis not present

## 2013-01-25 DIAGNOSIS — M722 Plantar fascial fibromatosis: Secondary | ICD-10-CM | POA: Diagnosis not present

## 2013-01-25 DIAGNOSIS — M25579 Pain in unspecified ankle and joints of unspecified foot: Secondary | ICD-10-CM | POA: Diagnosis not present

## 2013-02-03 DIAGNOSIS — M62838 Other muscle spasm: Secondary | ICD-10-CM | POA: Diagnosis not present

## 2013-02-17 DIAGNOSIS — M62838 Other muscle spasm: Secondary | ICD-10-CM | POA: Diagnosis not present

## 2013-03-03 DIAGNOSIS — M62838 Other muscle spasm: Secondary | ICD-10-CM | POA: Diagnosis not present

## 2013-03-24 DIAGNOSIS — M62838 Other muscle spasm: Secondary | ICD-10-CM | POA: Diagnosis not present

## 2013-04-14 DIAGNOSIS — M62838 Other muscle spasm: Secondary | ICD-10-CM | POA: Diagnosis not present

## 2013-05-05 DIAGNOSIS — M62838 Other muscle spasm: Secondary | ICD-10-CM | POA: Diagnosis not present

## 2013-05-11 DIAGNOSIS — L819 Disorder of pigmentation, unspecified: Secondary | ICD-10-CM | POA: Diagnosis not present

## 2013-05-11 DIAGNOSIS — D235 Other benign neoplasm of skin of trunk: Secondary | ICD-10-CM | POA: Diagnosis not present

## 2013-05-11 DIAGNOSIS — Z85820 Personal history of malignant melanoma of skin: Secondary | ICD-10-CM | POA: Diagnosis not present

## 2013-05-26 DIAGNOSIS — M62838 Other muscle spasm: Secondary | ICD-10-CM | POA: Diagnosis not present

## 2013-07-03 DIAGNOSIS — IMO0002 Reserved for concepts with insufficient information to code with codable children: Secondary | ICD-10-CM | POA: Diagnosis not present

## 2013-07-03 DIAGNOSIS — M9981 Other biomechanical lesions of cervical region: Secondary | ICD-10-CM | POA: Diagnosis not present

## 2013-07-03 DIAGNOSIS — M5030 Other cervical disc degeneration, unspecified cervical region: Secondary | ICD-10-CM | POA: Diagnosis not present

## 2013-07-03 DIAGNOSIS — M999 Biomechanical lesion, unspecified: Secondary | ICD-10-CM | POA: Diagnosis not present

## 2013-07-05 DIAGNOSIS — M9981 Other biomechanical lesions of cervical region: Secondary | ICD-10-CM | POA: Diagnosis not present

## 2013-07-05 DIAGNOSIS — M999 Biomechanical lesion, unspecified: Secondary | ICD-10-CM | POA: Diagnosis not present

## 2013-07-05 DIAGNOSIS — M5030 Other cervical disc degeneration, unspecified cervical region: Secondary | ICD-10-CM | POA: Diagnosis not present

## 2013-07-05 DIAGNOSIS — IMO0002 Reserved for concepts with insufficient information to code with codable children: Secondary | ICD-10-CM | POA: Diagnosis not present

## 2013-07-10 DIAGNOSIS — IMO0002 Reserved for concepts with insufficient information to code with codable children: Secondary | ICD-10-CM | POA: Diagnosis not present

## 2013-07-10 DIAGNOSIS — M5030 Other cervical disc degeneration, unspecified cervical region: Secondary | ICD-10-CM | POA: Diagnosis not present

## 2013-07-10 DIAGNOSIS — M999 Biomechanical lesion, unspecified: Secondary | ICD-10-CM | POA: Diagnosis not present

## 2013-07-10 DIAGNOSIS — M9981 Other biomechanical lesions of cervical region: Secondary | ICD-10-CM | POA: Diagnosis not present

## 2013-07-11 DIAGNOSIS — IMO0002 Reserved for concepts with insufficient information to code with codable children: Secondary | ICD-10-CM | POA: Diagnosis not present

## 2013-07-11 DIAGNOSIS — M5030 Other cervical disc degeneration, unspecified cervical region: Secondary | ICD-10-CM | POA: Diagnosis not present

## 2013-07-11 DIAGNOSIS — M999 Biomechanical lesion, unspecified: Secondary | ICD-10-CM | POA: Diagnosis not present

## 2013-07-11 DIAGNOSIS — M9981 Other biomechanical lesions of cervical region: Secondary | ICD-10-CM | POA: Diagnosis not present

## 2013-07-12 DIAGNOSIS — M9981 Other biomechanical lesions of cervical region: Secondary | ICD-10-CM | POA: Diagnosis not present

## 2013-07-12 DIAGNOSIS — M5030 Other cervical disc degeneration, unspecified cervical region: Secondary | ICD-10-CM | POA: Diagnosis not present

## 2013-07-12 DIAGNOSIS — IMO0002 Reserved for concepts with insufficient information to code with codable children: Secondary | ICD-10-CM | POA: Diagnosis not present

## 2013-07-12 DIAGNOSIS — M999 Biomechanical lesion, unspecified: Secondary | ICD-10-CM | POA: Diagnosis not present

## 2013-07-13 DIAGNOSIS — M9981 Other biomechanical lesions of cervical region: Secondary | ICD-10-CM | POA: Diagnosis not present

## 2013-07-13 DIAGNOSIS — M5030 Other cervical disc degeneration, unspecified cervical region: Secondary | ICD-10-CM | POA: Diagnosis not present

## 2013-07-13 DIAGNOSIS — M999 Biomechanical lesion, unspecified: Secondary | ICD-10-CM | POA: Diagnosis not present

## 2013-07-13 DIAGNOSIS — IMO0002 Reserved for concepts with insufficient information to code with codable children: Secondary | ICD-10-CM | POA: Diagnosis not present

## 2013-07-17 DIAGNOSIS — M999 Biomechanical lesion, unspecified: Secondary | ICD-10-CM | POA: Diagnosis not present

## 2013-07-17 DIAGNOSIS — M5030 Other cervical disc degeneration, unspecified cervical region: Secondary | ICD-10-CM | POA: Diagnosis not present

## 2013-07-17 DIAGNOSIS — IMO0002 Reserved for concepts with insufficient information to code with codable children: Secondary | ICD-10-CM | POA: Diagnosis not present

## 2013-07-17 DIAGNOSIS — M9981 Other biomechanical lesions of cervical region: Secondary | ICD-10-CM | POA: Diagnosis not present

## 2013-07-19 DIAGNOSIS — M999 Biomechanical lesion, unspecified: Secondary | ICD-10-CM | POA: Diagnosis not present

## 2013-07-19 DIAGNOSIS — M9981 Other biomechanical lesions of cervical region: Secondary | ICD-10-CM | POA: Diagnosis not present

## 2013-07-19 DIAGNOSIS — IMO0002 Reserved for concepts with insufficient information to code with codable children: Secondary | ICD-10-CM | POA: Diagnosis not present

## 2013-07-19 DIAGNOSIS — M5030 Other cervical disc degeneration, unspecified cervical region: Secondary | ICD-10-CM | POA: Diagnosis not present

## 2013-07-20 DIAGNOSIS — M999 Biomechanical lesion, unspecified: Secondary | ICD-10-CM | POA: Diagnosis not present

## 2013-07-20 DIAGNOSIS — M5030 Other cervical disc degeneration, unspecified cervical region: Secondary | ICD-10-CM | POA: Diagnosis not present

## 2013-07-20 DIAGNOSIS — M9981 Other biomechanical lesions of cervical region: Secondary | ICD-10-CM | POA: Diagnosis not present

## 2013-07-20 DIAGNOSIS — IMO0002 Reserved for concepts with insufficient information to code with codable children: Secondary | ICD-10-CM | POA: Diagnosis not present

## 2013-07-24 DIAGNOSIS — M9981 Other biomechanical lesions of cervical region: Secondary | ICD-10-CM | POA: Diagnosis not present

## 2013-07-24 DIAGNOSIS — M5030 Other cervical disc degeneration, unspecified cervical region: Secondary | ICD-10-CM | POA: Diagnosis not present

## 2013-07-24 DIAGNOSIS — IMO0002 Reserved for concepts with insufficient information to code with codable children: Secondary | ICD-10-CM | POA: Diagnosis not present

## 2013-07-24 DIAGNOSIS — M999 Biomechanical lesion, unspecified: Secondary | ICD-10-CM | POA: Diagnosis not present

## 2013-07-25 DIAGNOSIS — M9981 Other biomechanical lesions of cervical region: Secondary | ICD-10-CM | POA: Diagnosis not present

## 2013-07-25 DIAGNOSIS — M999 Biomechanical lesion, unspecified: Secondary | ICD-10-CM | POA: Diagnosis not present

## 2013-07-25 DIAGNOSIS — M5030 Other cervical disc degeneration, unspecified cervical region: Secondary | ICD-10-CM | POA: Diagnosis not present

## 2013-07-25 DIAGNOSIS — IMO0002 Reserved for concepts with insufficient information to code with codable children: Secondary | ICD-10-CM | POA: Diagnosis not present

## 2013-07-27 DIAGNOSIS — M999 Biomechanical lesion, unspecified: Secondary | ICD-10-CM | POA: Diagnosis not present

## 2013-07-27 DIAGNOSIS — IMO0002 Reserved for concepts with insufficient information to code with codable children: Secondary | ICD-10-CM | POA: Diagnosis not present

## 2013-07-27 DIAGNOSIS — M5030 Other cervical disc degeneration, unspecified cervical region: Secondary | ICD-10-CM | POA: Diagnosis not present

## 2013-07-27 DIAGNOSIS — M9981 Other biomechanical lesions of cervical region: Secondary | ICD-10-CM | POA: Diagnosis not present

## 2013-07-31 DIAGNOSIS — M999 Biomechanical lesion, unspecified: Secondary | ICD-10-CM | POA: Diagnosis not present

## 2013-07-31 DIAGNOSIS — M9981 Other biomechanical lesions of cervical region: Secondary | ICD-10-CM | POA: Diagnosis not present

## 2013-07-31 DIAGNOSIS — IMO0002 Reserved for concepts with insufficient information to code with codable children: Secondary | ICD-10-CM | POA: Diagnosis not present

## 2013-07-31 DIAGNOSIS — M5030 Other cervical disc degeneration, unspecified cervical region: Secondary | ICD-10-CM | POA: Diagnosis not present

## 2013-08-03 DIAGNOSIS — M5030 Other cervical disc degeneration, unspecified cervical region: Secondary | ICD-10-CM | POA: Diagnosis not present

## 2013-08-03 DIAGNOSIS — M9981 Other biomechanical lesions of cervical region: Secondary | ICD-10-CM | POA: Diagnosis not present

## 2013-08-03 DIAGNOSIS — IMO0002 Reserved for concepts with insufficient information to code with codable children: Secondary | ICD-10-CM | POA: Diagnosis not present

## 2013-08-03 DIAGNOSIS — M999 Biomechanical lesion, unspecified: Secondary | ICD-10-CM | POA: Diagnosis not present

## 2013-08-07 DIAGNOSIS — IMO0002 Reserved for concepts with insufficient information to code with codable children: Secondary | ICD-10-CM | POA: Diagnosis not present

## 2013-08-07 DIAGNOSIS — M5030 Other cervical disc degeneration, unspecified cervical region: Secondary | ICD-10-CM | POA: Diagnosis not present

## 2013-08-07 DIAGNOSIS — M999 Biomechanical lesion, unspecified: Secondary | ICD-10-CM | POA: Diagnosis not present

## 2013-08-07 DIAGNOSIS — M9981 Other biomechanical lesions of cervical region: Secondary | ICD-10-CM | POA: Diagnosis not present

## 2013-08-10 DIAGNOSIS — IMO0002 Reserved for concepts with insufficient information to code with codable children: Secondary | ICD-10-CM | POA: Diagnosis not present

## 2013-08-10 DIAGNOSIS — M999 Biomechanical lesion, unspecified: Secondary | ICD-10-CM | POA: Diagnosis not present

## 2013-08-10 DIAGNOSIS — M5030 Other cervical disc degeneration, unspecified cervical region: Secondary | ICD-10-CM | POA: Diagnosis not present

## 2013-08-10 DIAGNOSIS — M9981 Other biomechanical lesions of cervical region: Secondary | ICD-10-CM | POA: Diagnosis not present

## 2013-08-16 DIAGNOSIS — M9981 Other biomechanical lesions of cervical region: Secondary | ICD-10-CM | POA: Diagnosis not present

## 2013-08-16 DIAGNOSIS — M5030 Other cervical disc degeneration, unspecified cervical region: Secondary | ICD-10-CM | POA: Diagnosis not present

## 2013-08-16 DIAGNOSIS — IMO0002 Reserved for concepts with insufficient information to code with codable children: Secondary | ICD-10-CM | POA: Diagnosis not present

## 2013-08-16 DIAGNOSIS — M999 Biomechanical lesion, unspecified: Secondary | ICD-10-CM | POA: Diagnosis not present

## 2013-08-18 DIAGNOSIS — M25519 Pain in unspecified shoulder: Secondary | ICD-10-CM | POA: Diagnosis not present

## 2013-08-24 DIAGNOSIS — M9981 Other biomechanical lesions of cervical region: Secondary | ICD-10-CM | POA: Diagnosis not present

## 2013-08-24 DIAGNOSIS — IMO0002 Reserved for concepts with insufficient information to code with codable children: Secondary | ICD-10-CM | POA: Diagnosis not present

## 2013-08-24 DIAGNOSIS — M999 Biomechanical lesion, unspecified: Secondary | ICD-10-CM | POA: Diagnosis not present

## 2013-08-24 DIAGNOSIS — M5030 Other cervical disc degeneration, unspecified cervical region: Secondary | ICD-10-CM | POA: Diagnosis not present

## 2013-08-25 DIAGNOSIS — M19079 Primary osteoarthritis, unspecified ankle and foot: Secondary | ICD-10-CM | POA: Diagnosis not present

## 2013-08-25 DIAGNOSIS — M545 Low back pain, unspecified: Secondary | ICD-10-CM | POA: Diagnosis not present

## 2013-08-25 DIAGNOSIS — M25559 Pain in unspecified hip: Secondary | ICD-10-CM | POA: Diagnosis not present

## 2013-08-25 DIAGNOSIS — M25569 Pain in unspecified knee: Secondary | ICD-10-CM | POA: Diagnosis not present

## 2013-08-30 DIAGNOSIS — M9981 Other biomechanical lesions of cervical region: Secondary | ICD-10-CM | POA: Diagnosis not present

## 2013-08-30 DIAGNOSIS — IMO0002 Reserved for concepts with insufficient information to code with codable children: Secondary | ICD-10-CM | POA: Diagnosis not present

## 2013-08-30 DIAGNOSIS — M5030 Other cervical disc degeneration, unspecified cervical region: Secondary | ICD-10-CM | POA: Diagnosis not present

## 2013-08-30 DIAGNOSIS — M999 Biomechanical lesion, unspecified: Secondary | ICD-10-CM | POA: Diagnosis not present

## 2013-09-07 DIAGNOSIS — IMO0002 Reserved for concepts with insufficient information to code with codable children: Secondary | ICD-10-CM | POA: Diagnosis not present

## 2013-09-07 DIAGNOSIS — M5030 Other cervical disc degeneration, unspecified cervical region: Secondary | ICD-10-CM | POA: Diagnosis not present

## 2013-09-07 DIAGNOSIS — M9981 Other biomechanical lesions of cervical region: Secondary | ICD-10-CM | POA: Diagnosis not present

## 2013-09-07 DIAGNOSIS — N3020 Other chronic cystitis without hematuria: Secondary | ICD-10-CM | POA: Diagnosis not present

## 2013-09-07 DIAGNOSIS — M999 Biomechanical lesion, unspecified: Secondary | ICD-10-CM | POA: Diagnosis not present

## 2013-09-08 DIAGNOSIS — E789 Disorder of lipoprotein metabolism, unspecified: Secondary | ICD-10-CM | POA: Diagnosis not present

## 2013-09-08 DIAGNOSIS — R5381 Other malaise: Secondary | ICD-10-CM | POA: Diagnosis not present

## 2013-09-08 DIAGNOSIS — E0789 Other specified disorders of thyroid: Secondary | ICD-10-CM | POA: Diagnosis not present

## 2013-09-08 DIAGNOSIS — R5383 Other fatigue: Secondary | ICD-10-CM | POA: Diagnosis not present

## 2013-09-14 DIAGNOSIS — N8110 Cystocele, unspecified: Secondary | ICD-10-CM | POA: Diagnosis not present

## 2013-09-14 DIAGNOSIS — N3020 Other chronic cystitis without hematuria: Secondary | ICD-10-CM | POA: Diagnosis not present

## 2013-09-18 DIAGNOSIS — N8110 Cystocele, unspecified: Secondary | ICD-10-CM | POA: Diagnosis not present

## 2013-10-02 DIAGNOSIS — M1710 Unilateral primary osteoarthritis, unspecified knee: Secondary | ICD-10-CM | POA: Diagnosis not present

## 2013-10-07 ENCOUNTER — Encounter (HOSPITAL_COMMUNITY): Payer: Self-pay | Admitting: Emergency Medicine

## 2013-10-07 ENCOUNTER — Emergency Department (HOSPITAL_COMMUNITY)
Admission: EM | Admit: 2013-10-07 | Discharge: 2013-10-07 | Disposition: A | Discharge status: Home / Self Care | Payer: Medicare Other | Attending: Emergency Medicine | Admitting: Emergency Medicine

## 2013-10-07 ENCOUNTER — Emergency Department (HOSPITAL_COMMUNITY): Payer: Medicare Other

## 2013-10-07 DIAGNOSIS — X500XXA Overexertion from strenuous movement or load, initial encounter: Secondary | ICD-10-CM | POA: Diagnosis not present

## 2013-10-07 DIAGNOSIS — Y9389 Activity, other specified: Secondary | ICD-10-CM | POA: Insufficient documentation

## 2013-10-07 DIAGNOSIS — Z7982 Long term (current) use of aspirin: Secondary | ICD-10-CM | POA: Diagnosis not present

## 2013-10-07 DIAGNOSIS — Y9289 Other specified places as the place of occurrence of the external cause: Secondary | ICD-10-CM | POA: Insufficient documentation

## 2013-10-07 DIAGNOSIS — M25569 Pain in unspecified knee: Secondary | ICD-10-CM | POA: Insufficient documentation

## 2013-10-07 DIAGNOSIS — S99919A Unspecified injury of unspecified ankle, initial encounter: Secondary | ICD-10-CM

## 2013-10-07 DIAGNOSIS — S99929A Unspecified injury of unspecified foot, initial encounter: Secondary | ICD-10-CM

## 2013-10-07 DIAGNOSIS — S8990XA Unspecified injury of unspecified lower leg, initial encounter: Secondary | ICD-10-CM | POA: Insufficient documentation

## 2013-10-07 DIAGNOSIS — R296 Repeated falls: Secondary | ICD-10-CM | POA: Insufficient documentation

## 2013-10-07 DIAGNOSIS — S8991XA Unspecified injury of right lower leg, initial encounter: Secondary | ICD-10-CM

## 2013-10-07 MED ORDER — HYDROCODONE-ACETAMINOPHEN 5-325 MG PO TABS: 1.0000 | ORAL_TABLET | Freq: Four times a day (QID) | ORAL | Status: DC | PRN

## 2013-10-07 MED ORDER — HYDROCODONE-ACETAMINOPHEN 5-325 MG PO TABS
1.0000 | ORAL_TABLET | Freq: Once | ORAL | Status: AC
  Administered 2013-10-07: 1 via ORAL
  Filled 2013-10-07: qty 1

## 2013-10-07 NOTE — ED Provider Notes (Signed)
Medical screening examination/treatment/procedure(s) were performed by non-physician practitioner and as supervising physician I was immediately available for consultation/collaboration.   EKG Interpretation None      Rolland Porter, MD, Abram Sander   Janice Norrie, MD 10/07/13 [phone]

## 2013-10-07 NOTE — Discharge Instructions (Signed)
Return here as needed. Ice and elevate the knee. Follow up with Dr. Marlou Sa.

## 2013-10-07 NOTE — ED Notes (Addendum)
Pt hurt her right knee earlier this week, went to dr dean on Thursday, got a steriod shot. Today pt fell in bathroom, landed on buttocks. No LOC. Twisted right knee and hurt her right knee more. Pain 5/10 at present .

## 2013-10-07 NOTE — ED Provider Notes (Signed)
CSN: [id number]     Arrival date & time 10/07/13  1152 History   First MD Initiated Contact with Patient 10/07/13 1318     Chief Complaint  Patient presents with  . Knee Pain     (Consider location/radiation/quality/duration/timing/severity/associated sxs/prior Treatment) HPI Patient presents to the emergency department with right knee pain that got worse after a fall today.  The patient, states, that she was seen by Dr. Marlou Sa last week, and given a steroid injection into her knee due to some mild discomfort.  The patient, states, that nothing seems to make her condition, better, that movement and palpation make the pain, worse.  Patient, states, that she has no other injuries from the fall.  The patient denies loss of consciousness, chest pain, shortness breath, nausea, vomiting, diarrhea, headache, blurred vision, weakness, dizziness back pain, neck pain, or syncope.  The patient, states she's not had any surgical procedures done on her knee History reviewed. No pertinent past medical history. History reviewed. No pertinent past surgical history. History reviewed. No pertinent family history. History  Substance Use Topics  . Smoking status: Never Smoker   . Smokeless tobacco: Not on file  . Alcohol Use: No   OB History   Grav Para Term Preterm Abortions TAB SAB Ect Mult Living                 Review of Systems  All other systems negative except as documented in the HPI. All pertinent positives and negatives as reviewed in the HPI.   Allergies  Review of patient's allergies indicates no known allergies.  Home Medications   Prior to Admission medications   Medication Sig Start Date End Date Taking? Authorizing Provider  aspirin EC 81 MG tablet Take 81 mg by mouth daily.   Yes Historical Provider, MD  calcium carbonate (OS-CAL) 600 MG TABS tablet Take 600 mg by mouth at bedtime.   Yes Historical Provider, MD  calcium carbonate 1250 MG capsule Take 1,250 mg by mouth 2 (two) times  daily with a meal.   Yes Historical Provider, MD  cholecalciferol (VITAMIN D) 1000 UNITS tablet Take 1,000 Units by mouth daily.   Yes Historical Provider, MD  cycloSPORINE (RESTASIS) 0.05 % ophthalmic emulsion Place 1 drop into both eyes 2 (two) times daily.   Yes Historical Provider, MD  DULoxetine (CYMBALTA) 30 MG capsule Take 30 mg by mouth daily.   Yes Historical Provider, MD  lisinopril (PRINIVIL,ZESTRIL) 20 MG tablet Take 20 mg by mouth daily.   Yes Historical Provider, MD  Multiple Vitamin (MULTIVITAMIN WITH MINERALS) TABS tablet Take 1 tablet by mouth daily.   Yes Historical Provider, MD  omega-3 acid ethyl esters (LOVAZA) 1 G capsule Take 1 g by mouth 2 (two) times daily.   Yes Historical Provider, MD  simvastatin (ZOCOR) 20 MG tablet Take 20 mg by mouth daily.   Yes Historical Provider, MD  thyroid (ARMOUR) 60 MG tablet Take 60 mg by mouth daily before breakfast.   Yes Historical Provider, MD  trimethoprim (TRIMPEX) 100 MG tablet Take 100 mg by mouth 2 (two) times daily.   Yes Historical Provider, MD  trospium (SANCTURA) 20 MG tablet Take 20 mg by mouth 2 (two) times daily.   Yes Historical Provider, MD  vitamin C (ASCORBIC ACID) 250 MG tablet Take 250 mg by mouth daily.   Yes Historical Provider, MD  zolpidem (AMBIEN) 5 MG tablet Take 5-10 mg by mouth at bedtime as needed for sleep.   Yes Historical  Provider, MD   BP 160/93  Pulse 72  Temp(Src) 97.9 F (36.6 C) (Oral)  Resp 16  SpO2 98% Physical Exam  Constitutional: She is oriented to person, place, and time. She appears well-developed and well-nourished. No distress.  HENT:  Head: Normocephalic and atraumatic.  Eyes: Pupils are equal, round, and reactive to light.  Cardiovascular: Normal rate and regular rhythm.   Pulmonary/Chest: Effort normal. No respiratory distress.  Musculoskeletal:       Right knee: She exhibits decreased range of motion and swelling. She exhibits no ecchymosis, no deformity, no bony tenderness and  normal meniscus. Tenderness found. Medial joint line and lateral joint line tenderness noted.  Neurological: She is alert and oriented to person, place, and time.    ED Course  Procedures (including critical care time) Labs Review Labs Reviewed - No data to display  Imaging Review Dg Knee Complete 4 Views Right  10/07/2013   CLINICAL DATA:  Injury  EXAM: RIGHT KNEE - COMPLETE 4+ VIEW  COMPARISON:  None.  FINDINGS: No acute fracture. No dislocation. Osteopenia. Mild degenerative change.  IMPRESSION: No acute bony pathology.   Electronically Signed   By: Maryclare Bean M.D.   On: 10/07/2013 12:30   Patient be placed in a knee immobilizer and referred to Dr. Marlou Sa for further evaluation and care.  Patient is given a plan and all questions were answered.   Brent General, PA-C 10/07/13 1413

## 2013-10-09 DIAGNOSIS — M1710 Unilateral primary osteoarthritis, unspecified knee: Secondary | ICD-10-CM | POA: Diagnosis not present

## 2013-11-09 DIAGNOSIS — D1801 Hemangioma of skin and subcutaneous tissue: Secondary | ICD-10-CM | POA: Diagnosis not present

## 2013-11-09 DIAGNOSIS — G4733 Obstructive sleep apnea (adult) (pediatric): Secondary | ICD-10-CM | POA: Diagnosis not present

## 2013-11-09 DIAGNOSIS — D235 Other benign neoplasm of skin of trunk: Secondary | ICD-10-CM | POA: Diagnosis not present

## 2013-11-09 DIAGNOSIS — L819 Disorder of pigmentation, unspecified: Secondary | ICD-10-CM | POA: Diagnosis not present

## 2013-11-09 DIAGNOSIS — L821 Other seborrheic keratosis: Secondary | ICD-10-CM | POA: Diagnosis not present

## 2013-11-13 DIAGNOSIS — E0789 Other specified disorders of thyroid: Secondary | ICD-10-CM | POA: Diagnosis not present

## 2013-11-13 DIAGNOSIS — N390 Urinary tract infection, site not specified: Secondary | ICD-10-CM | POA: Diagnosis not present

## 2013-11-15 DIAGNOSIS — N8110 Cystocele, unspecified: Secondary | ICD-10-CM | POA: Diagnosis not present

## 2013-11-16 DIAGNOSIS — J019 Acute sinusitis, unspecified: Secondary | ICD-10-CM | POA: Diagnosis not present

## 2013-11-29 DIAGNOSIS — H2510 Age-related nuclear cataract, unspecified eye: Secondary | ICD-10-CM | POA: Diagnosis not present

## 2013-11-30 ENCOUNTER — Other Ambulatory Visit: Payer: Self-pay

## 2013-11-30 DIAGNOSIS — Z1231 Encounter for screening mammogram for malignant neoplasm of breast: Secondary | ICD-10-CM

## 2013-12-15 ENCOUNTER — Ambulatory Visit
Admission: RE | Admit: 2013-12-15 | Discharge: 2013-12-15 | Disposition: A | Discharge status: Home / Self Care | Payer: Medicare Other | Source: Ambulatory Visit

## 2013-12-15 DIAGNOSIS — J343 Hypertrophy of nasal turbinates: Secondary | ICD-10-CM | POA: Diagnosis not present

## 2013-12-15 DIAGNOSIS — Z1231 Encounter for screening mammogram for malignant neoplasm of breast: Secondary | ICD-10-CM

## 2013-12-15 DIAGNOSIS — J310 Chronic rhinitis: Secondary | ICD-10-CM | POA: Diagnosis not present

## 2013-12-29 DIAGNOSIS — J310 Chronic rhinitis: Secondary | ICD-10-CM | POA: Diagnosis not present

## 2013-12-29 DIAGNOSIS — J343 Hypertrophy of nasal turbinates: Secondary | ICD-10-CM | POA: Diagnosis not present

## 2014-01-02 ENCOUNTER — Other Ambulatory Visit (INDEPENDENT_AMBULATORY_CARE_PROVIDER_SITE_OTHER): Payer: Self-pay | Admitting: Otolaryngology

## 2014-01-02 DIAGNOSIS — J320 Chronic maxillary sinusitis: Secondary | ICD-10-CM

## 2014-01-08 ENCOUNTER — Ambulatory Visit
Admission: RE | Admit: 2014-01-08 | Discharge: 2014-01-08 | Disposition: A | Discharge status: Home / Self Care | Payer: Medicare Other | Source: Ambulatory Visit | Attending: Otolaryngology | Admitting: Otolaryngology

## 2014-01-08 DIAGNOSIS — J320 Chronic maxillary sinusitis: Secondary | ICD-10-CM

## 2014-01-08 DIAGNOSIS — Z85820 Personal history of malignant melanoma of skin: Secondary | ICD-10-CM | POA: Diagnosis not present

## 2014-01-08 DIAGNOSIS — J3489 Other specified disorders of nose and nasal sinuses: Secondary | ICD-10-CM | POA: Diagnosis not present

## 2014-01-16 DIAGNOSIS — E28310 Symptomatic premature menopause: Secondary | ICD-10-CM | POA: Diagnosis not present

## 2014-01-16 DIAGNOSIS — I10 Essential (primary) hypertension: Secondary | ICD-10-CM | POA: Diagnosis not present

## 2014-01-16 DIAGNOSIS — E780 Pure hypercholesterolemia: Secondary | ICD-10-CM | POA: Diagnosis not present

## 2014-01-16 DIAGNOSIS — E039 Hypothyroidism, unspecified: Secondary | ICD-10-CM | POA: Diagnosis not present

## 2014-01-16 DIAGNOSIS — Z23 Encounter for immunization: Secondary | ICD-10-CM | POA: Diagnosis not present

## 2014-01-16 DIAGNOSIS — Z0000 Encounter for general adult medical examination without abnormal findings: Secondary | ICD-10-CM | POA: Diagnosis not present

## 2014-01-22 DIAGNOSIS — I10 Essential (primary) hypertension: Secondary | ICD-10-CM | POA: Diagnosis not present

## 2014-01-22 DIAGNOSIS — M25519 Pain in unspecified shoulder: Secondary | ICD-10-CM | POA: Diagnosis not present

## 2014-01-22 DIAGNOSIS — M25569 Pain in unspecified knee: Secondary | ICD-10-CM | POA: Diagnosis not present

## 2014-01-22 DIAGNOSIS — Z1212 Encounter for screening for malignant neoplasm of rectum: Secondary | ICD-10-CM | POA: Diagnosis not present

## 2014-01-22 DIAGNOSIS — J329 Chronic sinusitis, unspecified: Secondary | ICD-10-CM | POA: Diagnosis not present

## 2014-01-29 DIAGNOSIS — M899 Disorder of bone, unspecified: Secondary | ICD-10-CM | POA: Diagnosis not present

## 2014-01-29 DIAGNOSIS — M8580 Other specified disorders of bone density and structure, unspecified site: Secondary | ICD-10-CM | POA: Diagnosis not present

## 2014-02-07 DIAGNOSIS — J323 Chronic sphenoidal sinusitis: Secondary | ICD-10-CM | POA: Diagnosis not present

## 2014-02-07 DIAGNOSIS — J320 Chronic maxillary sinusitis: Secondary | ICD-10-CM | POA: Diagnosis not present

## 2014-02-08 DIAGNOSIS — M791 Myalgia: Secondary | ICD-10-CM | POA: Diagnosis not present

## 2014-02-08 DIAGNOSIS — M2550 Pain in unspecified joint: Secondary | ICD-10-CM | POA: Diagnosis not present

## 2014-02-09 DIAGNOSIS — M791 Myalgia: Secondary | ICD-10-CM | POA: Diagnosis not present

## 2014-02-21 DIAGNOSIS — J320 Chronic maxillary sinusitis: Secondary | ICD-10-CM | POA: Diagnosis not present

## 2014-02-21 DIAGNOSIS — J323 Chronic sphenoidal sinusitis: Secondary | ICD-10-CM | POA: Diagnosis not present

## 2014-02-22 DIAGNOSIS — M150 Primary generalized (osteo)arthritis: Secondary | ICD-10-CM | POA: Diagnosis not present

## 2014-02-22 DIAGNOSIS — M8580 Other specified disorders of bone density and structure, unspecified site: Secondary | ICD-10-CM | POA: Diagnosis not present

## 2014-02-22 DIAGNOSIS — M25561 Pain in right knee: Secondary | ICD-10-CM | POA: Diagnosis not present

## 2014-02-23 ENCOUNTER — Other Ambulatory Visit: Payer: Self-pay | Admitting: Internal Medicine

## 2014-02-23 DIAGNOSIS — M25561 Pain in right knee: Secondary | ICD-10-CM

## 2014-02-28 DIAGNOSIS — M8580 Other specified disorders of bone density and structure, unspecified site: Secondary | ICD-10-CM | POA: Diagnosis not present

## 2014-03-05 ENCOUNTER — Other Ambulatory Visit: Payer: Medicare Other

## 2014-03-07 DIAGNOSIS — J320 Chronic maxillary sinusitis: Secondary | ICD-10-CM | POA: Diagnosis not present

## 2014-03-07 DIAGNOSIS — J323 Chronic sphenoidal sinusitis: Secondary | ICD-10-CM | POA: Diagnosis not present

## 2014-03-12 DIAGNOSIS — M778 Other enthesopathies, not elsewhere classified: Secondary | ICD-10-CM | POA: Diagnosis not present

## 2014-03-13 DIAGNOSIS — E039 Hypothyroidism, unspecified: Secondary | ICD-10-CM | POA: Diagnosis not present

## 2014-03-21 DIAGNOSIS — J323 Chronic sphenoidal sinusitis: Secondary | ICD-10-CM | POA: Diagnosis not present

## 2014-03-21 DIAGNOSIS — M7711 Lateral epicondylitis, right elbow: Secondary | ICD-10-CM | POA: Diagnosis not present

## 2014-03-21 DIAGNOSIS — J320 Chronic maxillary sinusitis: Secondary | ICD-10-CM | POA: Diagnosis not present

## 2014-04-02 DIAGNOSIS — J019 Acute sinusitis, unspecified: Secondary | ICD-10-CM | POA: Diagnosis not present

## 2014-04-11 DIAGNOSIS — R040 Epistaxis: Secondary | ICD-10-CM | POA: Diagnosis not present

## 2014-04-12 ENCOUNTER — Other Ambulatory Visit (INDEPENDENT_AMBULATORY_CARE_PROVIDER_SITE_OTHER): Payer: Self-pay | Admitting: Otolaryngology

## 2014-04-12 DIAGNOSIS — J329 Chronic sinusitis, unspecified: Secondary | ICD-10-CM

## 2014-04-16 ENCOUNTER — Ambulatory Visit
Admission: RE | Admit: 2014-04-16 | Discharge: 2014-04-16 | Disposition: A | Discharge status: Home / Self Care | Payer: Medicare Other | Source: Ambulatory Visit | Attending: Otolaryngology | Admitting: Otolaryngology

## 2014-04-16 DIAGNOSIS — Z86018 Personal history of other benign neoplasm: Secondary | ICD-10-CM | POA: Diagnosis not present

## 2014-04-16 DIAGNOSIS — J3489 Other specified disorders of nose and nasal sinuses: Secondary | ICD-10-CM | POA: Diagnosis not present

## 2014-04-16 DIAGNOSIS — J329 Chronic sinusitis, unspecified: Secondary | ICD-10-CM

## 2014-04-18 DIAGNOSIS — R040 Epistaxis: Secondary | ICD-10-CM | POA: Diagnosis not present

## 2014-04-27 ENCOUNTER — Other Ambulatory Visit (INDEPENDENT_AMBULATORY_CARE_PROVIDER_SITE_OTHER): Payer: Self-pay | Admitting: Otolaryngology

## 2014-04-27 DIAGNOSIS — J320 Chronic maxillary sinusitis: Secondary | ICD-10-CM | POA: Diagnosis not present

## 2014-04-27 DIAGNOSIS — J323 Chronic sphenoidal sinusitis: Secondary | ICD-10-CM | POA: Diagnosis not present

## 2014-04-27 DIAGNOSIS — J338 Other polyp of sinus: Secondary | ICD-10-CM | POA: Diagnosis not present

## 2014-04-27 DIAGNOSIS — J329 Chronic sinusitis, unspecified: Secondary | ICD-10-CM | POA: Diagnosis not present

## 2014-05-01 DIAGNOSIS — J320 Chronic maxillary sinusitis: Secondary | ICD-10-CM | POA: Diagnosis not present

## 2014-05-01 DIAGNOSIS — J323 Chronic sphenoidal sinusitis: Secondary | ICD-10-CM | POA: Diagnosis not present

## 2014-05-07 DIAGNOSIS — J320 Chronic maxillary sinusitis: Secondary | ICD-10-CM | POA: Diagnosis not present

## 2014-05-07 DIAGNOSIS — J323 Chronic sphenoidal sinusitis: Secondary | ICD-10-CM | POA: Diagnosis not present

## 2014-05-14 DIAGNOSIS — R3915 Urgency of urination: Secondary | ICD-10-CM | POA: Diagnosis not present

## 2014-05-14 DIAGNOSIS — R300 Dysuria: Secondary | ICD-10-CM | POA: Diagnosis not present

## 2014-05-22 DIAGNOSIS — R339 Retention of urine, unspecified: Secondary | ICD-10-CM | POA: Diagnosis not present

## 2014-05-22 DIAGNOSIS — N3946 Mixed incontinence: Secondary | ICD-10-CM | POA: Diagnosis not present

## 2014-05-25 DIAGNOSIS — E032 Hypothyroidism due to medicaments and other exogenous substances: Secondary | ICD-10-CM | POA: Diagnosis not present

## 2014-05-28 DIAGNOSIS — J323 Chronic sphenoidal sinusitis: Secondary | ICD-10-CM | POA: Diagnosis not present

## 2014-05-28 DIAGNOSIS — J320 Chronic maxillary sinusitis: Secondary | ICD-10-CM | POA: Diagnosis not present

## 2014-06-12 DIAGNOSIS — K219 Gastro-esophageal reflux disease without esophagitis: Secondary | ICD-10-CM | POA: Diagnosis not present

## 2014-06-12 DIAGNOSIS — J029 Acute pharyngitis, unspecified: Secondary | ICD-10-CM | POA: Diagnosis not present

## 2014-06-12 DIAGNOSIS — R490 Dysphonia: Secondary | ICD-10-CM | POA: Diagnosis not present

## 2014-06-12 DIAGNOSIS — J329 Chronic sinusitis, unspecified: Secondary | ICD-10-CM | POA: Diagnosis not present

## 2014-06-19 DIAGNOSIS — R339 Retention of urine, unspecified: Secondary | ICD-10-CM | POA: Diagnosis not present

## 2014-06-25 ENCOUNTER — Encounter: Payer: Self-pay | Admitting: Neurology

## 2014-06-25 ENCOUNTER — Ambulatory Visit (INDEPENDENT_AMBULATORY_CARE_PROVIDER_SITE_OTHER): Payer: Medicare Other | Admitting: Neurology

## 2014-06-25 VITALS — BP 134/62 | HR 70 | Resp 14 | Ht 65.0 in | Wt 149.6 lb

## 2014-06-25 DIAGNOSIS — I10 Essential (primary) hypertension: Secondary | ICD-10-CM | POA: Insufficient documentation

## 2014-06-25 DIAGNOSIS — J329 Chronic sinusitis, unspecified: Secondary | ICD-10-CM | POA: Insufficient documentation

## 2014-06-25 DIAGNOSIS — M542 Cervicalgia: Secondary | ICD-10-CM | POA: Diagnosis not present

## 2014-06-25 DIAGNOSIS — G4489 Other headache syndrome: Secondary | ICD-10-CM | POA: Insufficient documentation

## 2014-06-25 DIAGNOSIS — G4733 Obstructive sleep apnea (adult) (pediatric): Secondary | ICD-10-CM | POA: Insufficient documentation

## 2014-06-25 DIAGNOSIS — J324 Chronic pansinusitis: Secondary | ICD-10-CM

## 2014-06-25 DIAGNOSIS — Z9989 Dependence on other enabling machines and devices: Secondary | ICD-10-CM | POA: Insufficient documentation

## 2014-06-25 MED ORDER — ETODOLAC 300 MG PO CAPS: 300.0000 mg | ORAL_CAPSULE | Freq: Two times a day (BID) | ORAL | Status: DC

## 2014-06-25 NOTE — Progress Notes (Addendum)
[facility]  PATIENT: Cynthia [name] DOB: 11-29-1940  REFERRING DOCTOR OR PCP:  Deland Pretty SOURCE: patient and records  _________________________________   HISTORICAL  CHIEF COMPLAINT:  Chief Complaint  Patient presents with  . Headaches    She c/o onset of right sided h/a's August 2015.  She saw Dr. Benjamine Mola (ENT phone # [phone], fax# [phone])  Sts. mri showed much sinus infection, and she had 2 procedures to clear infection.  Sts. infection is resolved, but right frontal "heavinss" remains./fim    HISTORY OF PRESENT ILLNESS:  I had the pleasure of seeing your patient, Cynthia [name], at [facility] for neurologic consultation regarding her headaches. As you know, she is a 74 year old woman who has had right sided chronic headaches since July / August 2015.   She was referred to ENT and had CT scan i 9/20-15 and 04/2014.    She had sinus surgery in November 2015.  She also received several rounds of antibiotics.       Headaches are now mostly above the right eye and temple region.  Pain comes and goes some.  Quality is now mostly pressure but will be more intense at times and cause headache.  The more intense headaches occur several tines a day.    With Aleve pain is milder and she no longer needs to lay down to help the headache if she takes a couple times a day.   She denies vision change, nausea, vomiting, photophobia or phonophobia when a HA occurs.     She will get some right neck pain that sometimes seems related to the pain.     She notes some right > left neck and shoulder pain that is more recent.  Neck pain often intensifies as headache increases.   She notes doing a lot of seated computer work.     I reviewed office notes and also reviewed images and report for a CT scan of the sinuses performed 04/15/2014.  It showed chronic inflammatory changes with mucoperiosteal thickening of the left maxillary sinus, opacification of several of the left  ethmoid air cells and opacification of the right sphenoid sinus the left ostiomeatal complex was occluded.   There has been prior left craniotomy consistent with her reported history of left acoustic neuroma surgery.    She has OSA and is on CPAP.   She continues to wear nightly and notes no changes in therapy or how she does with it.      She has hypertension that is well controlled and no recent changes in medication.      She gets frequent UTI's and is on macrodantin and going to be on cephalexin.   She had a recent Z-pack for her sinuses. OSA, hypertension    REVIEW OF SYSTEMS: Constitutional: No fevers, chills, sweats, or change in appetite Eyes: No visual changes, double vision, eye pain Ear, nose and throat: Chronic sinusutis.  Bilat hearing loss, worse on left  Cardiovascular: No chest pain, palpitations Respiratory: No shortness of breath at rest or with exertion.   No wheezes GastrointestinaI: No nausea, vomiting, diarrhea, abdominal pain, fecal incontinence Genitourinary: Frequent UTIs.  SOme frequency. Musculoskeletal: No neck pain, back pain Integumentary: No rash, pruritus, skin lesions Neurological: as above Psychiatric: No depression at this time.  No anxiety Endocrine: No palpitations, diaphoresis, change in appetite, change in weigh or increased thirst Hematologic/Lymphatic: No anemia, purpura, petechiae. Allergic/Immunologic: No itchy/runny eyes, recent allergic reactions, rashes  ALLERGIES: No Known Allergies  HOME MEDICATIONS:  Current outpatient prescriptions:  .  alendronate (FOSAMAX) 70 MG tablet, , Disp: , Rfl: 3 .  aspirin EC 81 MG tablet, Take 81 mg by mouth daily., Disp: , Rfl:  .  calcium carbonate (OS-CAL) 600 MG TABS tablet, Take 600 mg by mouth at bedtime., Disp: , Rfl:  .  calcium carbonate 1250 MG capsule, Take 1,250 mg by mouth 2 (two) times daily with a meal., Disp: , Rfl:  .  cephALEXin (KEFLEX) 250 MG capsule, , Disp: , Rfl:  .   cholecalciferol (VITAMIN D) 1000 UNITS tablet, Take 1,000 Units by mouth daily., Disp: , Rfl:  .  cycloSPORINE (RESTASIS) 0.05 % ophthalmic emulsion, Place 1 drop into both eyes 2 (two) times daily., Disp: , Rfl:  .  DULoxetine (CYMBALTA) 30 MG capsule, Take 30 mg by mouth daily., Disp: , Rfl:  .  HYDROcodone-acetaminophen (NORCO/VICODIN) 5-325 MG per tablet, Take 1 tablet by mouth every 6 (six) hours as needed for moderate pain. (Patient not taking: Reported on 06/25/2014), Disp: 20 tablet, Rfl: 0 .  lisinopril (PRINIVIL,ZESTRIL) 20 MG tablet, Take 20 mg by mouth daily., Disp: , Rfl:  .  Multiple Vitamin (MULTIVITAMIN WITH MINERALS) TABS tablet, Take 1 tablet by mouth daily., Disp: , Rfl:  .  nitrofurantoin, macrocrystal-monohydrate, (MACROBID) 100 MG capsule, , Disp: , Rfl:  .  omega-3 acid ethyl esters (LOVAZA) 1 G capsule, Take 1 g by mouth 2 (two) times daily., Disp: , Rfl:  .  pantoprazole (PROTONIX) 40 MG tablet, Take 40 mg by mouth daily., Disp: , Rfl: 1 .  simvastatin (ZOCOR) 20 MG tablet, Take 20 mg by mouth daily., Disp: , Rfl:  .  thyroid (ARMOUR) 60 MG tablet, Take 60 mg by mouth daily before breakfast., Disp: , Rfl:  .  trimethoprim (TRIMPEX) 100 MG tablet, Take 100 mg by mouth 2 (two) times daily., Disp: , Rfl:  .  trospium (SANCTURA) 20 MG tablet, Take 20 mg by mouth 2 (two) times daily., Disp: , Rfl:  .  vitamin C (ASCORBIC ACID) 250 MG tablet, Take 250 mg by mouth daily., Disp: , Rfl:  .  zolpidem (AMBIEN) 5 MG tablet, Take 5-10 mg by mouth at bedtime as needed for sleep., Disp: , Rfl:   PAST MEDICAL HISTORY: Past Medical History  Diagnosis Date  . Hearing loss   . Vision abnormalities   . Cancer   . Headache   . Hypertension     PAST SURGICAL HISTORY: Past Surgical History  Procedure Laterality Date  . Partial hysterectomy    . Tonsillectomy    . Appendectomy    . Craniectomy for excision of acoustic neuroma      FAMILY HISTORY: Family History  Problem Relation  Age of Onset  . Congestive Heart Failure Mother   . Heart disease Father     SOCIAL HISTORY:  History   Social History  . Marital Status: Married    Spouse Name: N/A  . Number of Children: N/A  . Years of Education: N/A   Occupational History  . Not on file.   Social History Main Topics  . Smoking status: Never Smoker   . Smokeless tobacco: Not on file  . Alcohol Use: No  . Drug Use: No  . Sexual Activity: Not on file   Other Topics Concern  . Not on file   Social History Narrative     PHYSICAL EXAM  Filed Vitals:   06/25/14 0923  BP: 134/62  Pulse: 70  Resp: 14  Height: 5\' 5"  (1.651 m)  Weight: 149 lb 9.6 oz (67.858 kg)    Body mass index is 24.89 kg/(m^2).   General: The patient is well-developed and well-nourished and in no acute distress  Eyes:  Funduscopic exam shows normal optic discs and retinal vessels.  Early cataract  Neck: The neck is supple, no carotid bruits are noted.  The neck is tender at right splenius capitis and right C2-C4 paraspinal muscles > left.   Mild trapezius/rhomboid tenderness also  Cardiovascular: The heart has a regular rate and rhythm with a normal S1 and S2. There were no murmurs, gallops or rubs.   Skin: Extremities are without significant edema.  Musculoskeletal:  Back is nontender  Neurologic Exam  Mental status: The patient is alert and oriented x 3 at the time of the examination. The patient has apparent normal recent and remote memory, with an apparently normal attention span and concentration ability.   Speech is normal.  Cranial nerves: Extraocular movements are full. Pupils are equal, round, and reactive to light and accomodation.  Visual fields are full.  Facial symmetry is present. There is good facial sensation to soft touch bilaterally.Facial strength is normal.  Trapezius and sternocleidomastoid strength is normal. No dysarthria is noted.  The tongue is midline, and the patient has symmetric elevation of the  soft palate. No obvious hearing deficits are noted.  Motor:  Muscle bulk is normal.   Tone is normal. Strength is  5 / 5 in all 4 extremities.   Sensory: Sensory testing is intact to soft touch in all 4 extremities.  Coordination: Cerebellar testing reveals good finger-nose-finger  bilaterally.  Gait and station: Station is normal.   Gait is normal. Tandem gait is mildly wide but probably normal for age.. Romberg is negative.   Reflexes: Deep tendon reflexes are symmetric and normal bilaterally.     DIAGNOSTIC DATA (LABS, IMAGING, TESTING) - I reviewed patient records, labs, notes, testing and imaging myself where available.      ASSESSMENT AND PLAN  Other headache syndrome  Chronic pansinusitis  Neck pain  OSA (obstructive sleep apnea)  Essential hypertension, benign   In summary, Mrs. Nissan is a 74 year old woman with chronic headaches for the past 6 months in the setting of chronic sinusitis.  Headaches change but persisted after sinus surgery. She's been told that her sinus issues should not be contributing to headache at this point.  She has tenderness in the splenius capitis, upper cervical spine muscles and other adjacent neck and shoulder muscles. This causes some neck pain and might be contributing to her headache as well. I did a trigger point injection of the splenius capitis, C2 and C3 paraspinal muscles with 80 mg Depo-Medrol and Marcaine. She tolerated the procedure well. I will also start etodolac 300 mg by mouth twice a day at the longer-lasting NSAID will probably be better than intermittent use of short acting once.  She will return to see me in 6 weeks or call sooner if there are are new or worsening neurologic symptoms.   Platon Arocho A. Felecia Shelling, MD, PhD 1/61/0960, 4:54 AM Certified in Neurology, Clinical Neurophysiology, Sleep Medicine, Pain Medicine and Neuroimaging  [facility] [address], [place], [place] [address] [phone]

## 2014-06-26 DIAGNOSIS — R51 Headache: Secondary | ICD-10-CM | POA: Diagnosis not present

## 2014-06-26 DIAGNOSIS — J029 Acute pharyngitis, unspecified: Secondary | ICD-10-CM | POA: Diagnosis not present

## 2014-06-26 DIAGNOSIS — K219 Gastro-esophageal reflux disease without esophagitis: Secondary | ICD-10-CM | POA: Diagnosis not present

## 2014-06-26 DIAGNOSIS — J329 Chronic sinusitis, unspecified: Secondary | ICD-10-CM | POA: Diagnosis not present

## 2014-07-03 DIAGNOSIS — R51 Headache: Secondary | ICD-10-CM | POA: Diagnosis not present

## 2014-07-03 DIAGNOSIS — M542 Cervicalgia: Secondary | ICD-10-CM | POA: Diagnosis not present

## 2014-07-03 DIAGNOSIS — R413 Other amnesia: Secondary | ICD-10-CM | POA: Diagnosis not present

## 2014-07-03 DIAGNOSIS — N3946 Mixed incontinence: Secondary | ICD-10-CM | POA: Diagnosis not present

## 2014-07-03 DIAGNOSIS — N3020 Other chronic cystitis without hematuria: Secondary | ICD-10-CM | POA: Diagnosis not present

## 2014-07-25 ENCOUNTER — Ambulatory Visit: Payer: Medicare Other | Attending: Neurology | Admitting: Physical Therapy

## 2014-07-25 DIAGNOSIS — M542 Cervicalgia: Secondary | ICD-10-CM | POA: Diagnosis not present

## 2014-07-25 DIAGNOSIS — R29898 Other symptoms and signs involving the musculoskeletal system: Secondary | ICD-10-CM | POA: Diagnosis not present

## 2014-07-25 NOTE — Therapy (Addendum)
[place] [address] [place], [place], [address] Phone: [phone]   Fax:  [phone]  Physical Therapy Evaluation  Patient Details  Name: Cynthia [name] MRN: [id number] Date of Birth: April 15, 1940 Referring Provider:  Karl Luke, MD  Encounter Date: 07/25/2014      PT End of Session - 07/25/14 1351    Visit Number 1   Number of Visits 12   Date for PT Re-Evaluation 09/05/14   PT Start Time 1325   PT Stop Time 1405   PT Time Calculation (min) 40 min   Activity Tolerance Patient tolerated treatment well   Behavior During Therapy [facility] for tasks assessed/performed      Past Medical History  Diagnosis Date  . Hearing loss   . Vision abnormalities   . Cancer   . Headache   . Hypertension     Past Surgical History  Procedure Laterality Date  . Partial hysterectomy    . Tonsillectomy    . Appendectomy    . Craniectomy for excision of acoustic neuroma      There were no vitals filed for this visit.  Visit Diagnosis:  Pain in neck - Plan: PT plan of care cert/re-cert  Upper extremity weakness - Plan: PT plan of care cert/re-cert      Subjective Assessment - 07/25/14 1328    Subjective Pt is a 74 y/o female who presents to OPPT for neck pain x 2 years.  Pt reports having tried PT about 2 years ago, accupuncture and deep massage currently.     Limitations House hold activities   Patient Stated Goals decrease pain and tightness in neck muscles   Currently in Pain? Yes   Pain Score 5    Pain Location Neck   Pain Orientation Mid;Posterior   Pain Descriptors / Indicators Tightness  twisting   Pain Onset More than a month ago   Pain Frequency Constant   Aggravating Factors  turning head   Pain Relieving Factors muscle relaxors            [facility] PT Assessment - 07/25/14 1332    Assessment   Medical Diagnosis cervicogenic headache   Onset Date --  approx 2-3 yrs ago   Next MD Visit no longer  seeing referring MD; current neurologist Dr. Mariea Stable   Prior Therapy OPPT 2 years ago   Precautions   Precautions None   Restrictions   Weight Bearing Restrictions No   Balance Screen   Has the patient fallen in the past 6 months No   Has the patient had a decrease in activity level because of a fear of falling?  No   Is the patient reluctant to leave their home because of a fear of falling?  No   Home Environment   Living Enviornment Private residence   Living Arrangements Spouse/significant other   Prior Function   Level of Independence Independent with basic ADLs;Independent with gait;Independent with transfers   Vocation Retired   Leisure yoga; active in church and neighborhood   Cognition   Overall Cognitive Status Within [place] for tasks assessed   Observation/Other Assessments   Focus on Therapeutic Outcomes (FOTO)  55 (45% limited; predicted 40% limited)   Posture/Postural Control   Posture/Postural Control Postural limitations   Postural Limitations Rounded Shoulders;Forward head;Increased thoracic kyphosis   Posture Comments R lateral lean with L shoulder elevated   AROM   AROM Assessment Site Cervical   Cervical Flexion 54  Cervical Extension 45  incr pain with ext v. flex   Cervical - Right Side Bend 31   Cervical - Left Side Bend 20   Cervical - Right Rotation 56   Cervical - Left Rotation 53   Strength   Strength Assessment Site Shoulder;Elbow;Hand   Right Shoulder Flexion 3+/5   Right Shoulder ABduction 3+/5   Right Shoulder Internal Rotation 4/5   Right Shoulder External Rotation 3/5   Left Shoulder Flexion 3+/5   Left Shoulder ABduction 3+/5   Left Shoulder Internal Rotation 4/5   Left Shoulder External Rotation 3/5   Right/Left Elbow Right;Left   Right Elbow Flexion 4/5   Right Elbow Extension 3+/5   Left Elbow Flexion 4/5   Left Elbow Extension 3+/5   Gross Grasp Functional   Left Hand Gross Grasp Functional   Palpation   Palpation  significant tightness and tenderness bil UT, rhomboids L>R; bil scapular winging                   [facility] Adult PT Treatment/Exercise - 07/25/14 1332    Modalities   Modalities Electrical Stimulation;Moist Heat   Moist Heat Therapy   Number Minutes Moist Heat 15 Minutes   Moist Heat Location Other (comment)  neck   Electrical Stimulation   Electrical Stimulation Location neck/upper thoracic spine   Electrical Stimulation Action IFC   Electrical Stimulation Parameters to tolerance x 15 min   Electrical Stimulation Goals Pain                PT Education - 07/25/14 1350    Education provided Yes   Education Details clinical findings; POC, goals of care   Person(s) Educated Patient   Methods Explanation   Comprehension Verbalized understanding             PT Long Term Goals - 07/25/14 1354    PT LONG TERM GOAL #1   Title independent with HEP (09/05/14)   Time 6   Period Weeks   Status New   PT LONG TERM GOAL #2   Title verbalize understanding of posture and body mechanics to reduce risk of reinjury (09/05/14)   Time 6   Period Weeks   Status New   PT LONG TERM GOAL #3   Title improve cervical rotation to > 60 degrees for improved ability to drive without pain (2/58/52)   Time 6   Period Weeks   Status New   PT LONG TERM GOAL #4   Title report pain < 2/10 with activity for improved function (09/05/14)   Time 6   Period Weeks   Status New               Plan - 07/25/14 1352    Clinical Impression Statement Pt presents to OPPT with difficutly with functional activities due to pain in neck.  Pt demonstrates poor posture and UE weakness as well as muscle tightness and tenderness  Will benefit from PT to maximize function and decrease pain.     Pt will benefit from skilled therapeutic intervention in order to improve on the following deficits Improper body mechanics;Postural dysfunction;Pain;Decreased strength;Decreased range of motion;Decreased  activity tolerance   Rehab Potential Good   PT Frequency 2x / week   PT Duration 6 weeks   PT Treatment/Interventions ADLs/Self Care Home Management;Moist Heat;Therapeutic activities;Patient/family education;Therapeutic exercise;Passive range of motion;Ultrasound;Manual techniques;Neuromuscular re-education;Cryotherapy;Electrical Stimulation;Functional mobility training   PT Next Visit Plan HEP for flexibility and UE strength, modalities PRN   Consulted  and Agree with Plan of Care Patient          G-Codes - 08-20-14 1356    Functional Assessment Tool Used FOTO 55% limited   Functional Limitation Carrying, moving and handling objects   Carrying, Moving and Handling Objects Current Status [phone]) At least 40 percent but less than 60 percent impaired, limited or restricted   Carrying, Moving and Handling Objects Goal Status (U0454) At least 20 percent but less than 40 percent impaired, limited or restricted       Problem List Patient Active Problem List   Diagnosis Date Noted  . Other headache syndrome 06/25/2014  . Sinusitis, chronic 06/25/2014  . Neck pain 06/25/2014  . OSA (obstructive sleep apnea) 06/25/2014  . Essential hypertension, benign 06/25/2014   Laureen Abrahams, PT, DPT 08/20/2014 2:48 PM  [address] [place], [place], [address] Phone: [phone]   Fax:  [phone]

## 2014-07-27 DIAGNOSIS — R339 Retention of urine, unspecified: Secondary | ICD-10-CM | POA: Diagnosis not present

## 2014-07-27 DIAGNOSIS — N3946 Mixed incontinence: Secondary | ICD-10-CM | POA: Diagnosis not present

## 2014-07-30 ENCOUNTER — Encounter: Payer: Self-pay | Admitting: Physical Therapy

## 2014-08-02 ENCOUNTER — Ambulatory Visit: Payer: Medicare Other | Admitting: Physical Therapy

## 2014-08-02 ENCOUNTER — Encounter: Payer: Self-pay | Admitting: Physical Therapy

## 2014-08-02 DIAGNOSIS — R29898 Other symptoms and signs involving the musculoskeletal system: Secondary | ICD-10-CM | POA: Diagnosis not present

## 2014-08-02 DIAGNOSIS — M542 Cervicalgia: Secondary | ICD-10-CM | POA: Diagnosis not present

## 2014-08-02 NOTE — Therapy (Signed)
[place], [place], [address] Phone: [phone]   Fax:  [phone]  Physical Therapy Treatment  Patient Details  Name: Cynthia [name] MRN: [id number] Date of Birth: 25-Jan-1941 Referring Provider:  Karl Luke, MD  Encounter Date: 08/02/2014      PT End of Session - 08/02/14 1517    Visit Number 2   Date for PT Re-Evaluation 09/05/14   PT Start Time 2505   PT Stop Time 1533   PT Time Calculation (min) 50 min      Past Medical History  Diagnosis Date  . Hearing loss   . Vision abnormalities   . Cancer   . Headache   . Hypertension     Past Surgical History  Procedure Laterality Date  . Partial hysterectomy    . Tonsillectomy    . Appendectomy    . Craniectomy for excision of acoustic neuroma      There were no vitals filed for this visit.  Visit Diagnosis:  Pain in neck  Upper extremity weakness      Subjective Assessment - 08/02/14 1447    Subjective Patient missed last appointment due to husband in hospital.  Reports that has had this neck pain for a few years.   Patient Stated Goals decrease pain and tightness in neck muscles   Currently in Pain? Yes   Pain Score 5    Pain Location Neck   Pain Orientation Mid   Pain Type Chronic pain   Pain Frequency Constant                         [facility] Adult PT Treatment/Exercise - 08/02/14 0001    Neck Exercises: Machines for Strengthening   UBE (Upper Arm Bike) Level 1 4 minutes   Neck Exercises: Theraband   Scapula Retraction 20 reps;Red   Shoulder Extension 20 reps;Red   Rows 20 reps;Red   Shoulder External Rotation 20 reps;Red   Moist Heat Therapy   Number Minutes Moist Heat 15 Minutes   Electrical Stimulation   Electrical Stimulation Location neck/upper thoracic spine   Electrical Stimulation Parameters IFC   Electrical Stimulation Goals Pain   Manual Therapy   Manual Therapy Myofascial  release;Manual Traction   Myofascial Release cervical and upper traps   Manual Traction occipital release                     PT Long Term Goals - 07/25/14 1354    PT LONG TERM GOAL #1   Title independent with HEP (09/05/14)   Time 6   Period Weeks   Status New   PT LONG TERM GOAL #2   Title verbalize understanding of posture and body mechanics to reduce risk of reinjury (09/05/14)   Time 6   Period Weeks   Status New   PT LONG TERM GOAL #3   Title improve cervical rotation to > 60 degrees for improved ability to drive without pain (3/97/67)   Time 6   Period Weeks   Status New   PT LONG TERM GOAL #4   Title report pain < 2/10 with activity for improved function (09/05/14)   Time 6   Period Weeks   Status New               Plan - 08/02/14 1517    Clinical Impression Statement Reported feeling good with the occipital release and the  manual distraction, suffers from chronic neck pain, spasms and headache   PT Next Visit Plan may try Saunder's traction   Consulted and Agree with Plan of Care Patient        Problem List Patient Active Problem List   Diagnosis Date Noted  . Other headache syndrome 06/25/2014  . Sinusitis, chronic 06/25/2014  . Neck pain 06/25/2014  . OSA (obstructive sleep apnea) 06/25/2014  . Essential hypertension, benign 06/25/2014    Sumner Boast, PT 08/02/2014, 3:19 PM  [place] [address] [place], [place], [address] Phone: [phone]   Fax:  [phone]

## 2014-08-07 ENCOUNTER — Encounter: Payer: Self-pay | Admitting: Physical Therapy

## 2014-08-07 ENCOUNTER — Ambulatory Visit: Payer: Medicare Other | Admitting: Physical Therapy

## 2014-08-07 ENCOUNTER — Ambulatory Visit (INDEPENDENT_AMBULATORY_CARE_PROVIDER_SITE_OTHER): Payer: Medicare Other | Admitting: Neurology

## 2014-08-07 ENCOUNTER — Encounter: Payer: Self-pay | Admitting: Neurology

## 2014-08-07 VITALS — BP 138/84 | HR 76 | Resp 14 | Ht 65.0 in | Wt 150.2 lb

## 2014-08-07 DIAGNOSIS — G4489 Other headache syndrome: Secondary | ICD-10-CM

## 2014-08-07 DIAGNOSIS — R29898 Other symptoms and signs involving the musculoskeletal system: Secondary | ICD-10-CM | POA: Diagnosis not present

## 2014-08-07 DIAGNOSIS — R682 Dry mouth, unspecified: Secondary | ICD-10-CM | POA: Insufficient documentation

## 2014-08-07 DIAGNOSIS — G4733 Obstructive sleep apnea (adult) (pediatric): Secondary | ICD-10-CM

## 2014-08-07 DIAGNOSIS — H04123 Dry eye syndrome of bilateral lacrimal glands: Secondary | ICD-10-CM | POA: Insufficient documentation

## 2014-08-07 DIAGNOSIS — M542 Cervicalgia: Secondary | ICD-10-CM | POA: Diagnosis not present

## 2014-08-07 NOTE — Therapy (Signed)
[place], [place], [address] Phone: [phone]   Fax:  [phone]  Physical Therapy Treatment  Patient Details  Name: Cynthia [name] MRN: [id number] Date of Birth: 03/13/1941 Referring Provider:  Karl Luke, MD  Encounter Date: 08/07/2014      PT End of Session - 08/07/14 1601    Visit Number 3   Date for PT Re-Evaluation 09/05/14   PT Start Time 1527   PT Stop Time 1633   PT Time Calculation (min) 66 min      Past Medical History  Diagnosis Date  . Hearing loss   . Vision abnormalities   . Cancer   . Headache   . Hypertension     Past Surgical History  Procedure Laterality Date  . Partial hysterectomy    . Tonsillectomy    . Appendectomy    . Craniectomy for excision of acoustic neuroma      There were no vitals filed for this visit.  Visit Diagnosis:  Pain in neck  Upper extremity weakness      Subjective Assessment - 08/07/14 1539    Subjective I think the last treatment really helped.   Currently in Pain? Yes   Pain Score 3    Pain Location Neck   Pain Orientation Mid                         [facility] Adult PT Treatment/Exercise - 08/07/14 0001    Neck Exercises: Machines for Strengthening   UBE (Upper Arm Bike) Level 3 x 4 minutes   Neck Exercises: Theraband   Scapula Retraction 20 reps;Red   Shoulder Extension 20 reps;Red   Rows 20 reps;Red   Shoulder External Rotation 20 reps;Red   Other Theraband Exercises cervical retraction supine head on ball   Moist Heat Therapy   Number Minutes Moist Heat 15 Minutes   Moist Heat Location Other (comment)   Electrical Stimulation   Electrical Stimulation Location neck/upper thoracic spine   Electrical Stimulation Parameters IFC   Electrical Stimulation Goals Pain   Traction   Type of Traction Cervical   Min (lbs) 13   Max (lbs) 13   Hold Time static   Time 12   Manual Therapy   Manual Therapy  Myofascial release;Manual Traction   Myofascial Release cervical and upper traps                     PT Long Term Goals - 07/25/14 1354    PT LONG TERM GOAL #1   Title independent with HEP (09/05/14)   Time 6   Period Weeks   Status New   PT LONG TERM GOAL #2   Title verbalize understanding of posture and body mechanics to reduce risk of reinjury (09/05/14)   Time 6   Period Weeks   Status New   PT LONG TERM GOAL #3   Title improve cervical rotation to > 60 degrees for improved ability to drive without pain (07/26/81)   Time 6   Period Weeks   Status New   PT LONG TERM GOAL #4   Title report pain < 2/10 with activity for improved function (09/05/14)   Time 6   Period Weeks   Status New               Plan - 08/07/14 1601    Clinical Impression Statement very large tight knot in  the left upper trap, limited ROM and function   PT Next Visit Plan see if traction helped        Problem List Patient Active Problem List   Diagnosis Date Noted  . Dry mouth 08/07/2014  . Dry eyes 08/07/2014  . Other headache syndrome 06/25/2014  . Sinusitis, chronic 06/25/2014  . Neck pain 06/25/2014  . OSA (obstructive sleep apnea) 06/25/2014  . Essential hypertension, benign 06/25/2014    Sumner Boast, PT 08/07/2014, 4:02 PM  [place] [address] [place], [place], [address] Phone: [phone]   Fax:  [phone]

## 2014-08-07 NOTE — Progress Notes (Signed)
[facility]  PATIENT: Cynthia [name] DOB: [birth year] _________________________________   HISTORICAL  CHIEF COMPLAINT:  Chief Complaint  Patient presents with  . Headaches    Sts. h/a's have been less frequent--"I really haven't had a full-blown headace" since last ov.  Sts. she tolerates Etodolac bid well./fim    HISTORY OF PRESENT ILLNESS:  Since her last visit, the HA's are doing better.    She has had no severe headaches.   She has occasioanal milder ones knocked out by her next etodolac or by Tylenol.    No visual changes, numbness or weakness.  She notes her mouth is dryer.   She has had some dry eyes,too.   She has noted more joint pain as well.   No rashes but she has more itching.      When present, headaches are above the right eye and temple region.  She still has some neck pain and left shoulder pain.   She notes some right > left neck and shoulder pain that is more recent.  Neck pain often intensifies as headache increases.   She notes doing a lot of seated computer work.     She has OSA and is on CPAP.   She continues to wear nightly and notes no changes in therapy.   She feels better when she sleeps the whole night with it.     REVIEW OF SYSTEMS: Constitutional: No fevers, chills, sweats, or change in appetite Eyes: No visual changes, double vision, eye pain Ear, nose and throat: Chronic sinusutis.  Bilat hearing loss, worse on left  Cardiovascular: No chest pain, palpitations Respiratory: No shortness of breath at rest or with exertion.   No wheezes GastrointestinaI: No nausea, vomiting, diarrhea, abdominal pain, fecal incontinence.   Occ dysphagia Genitourinary: Frequent UTIs.  SOme frequency. Musculoskeletal: as above Integumentary: No rash, pruritus, skin lesions Neurological: as above Psychiatric: No depression at this time.  No anxiety Endocrine: No palpitations, diaphoresis, change in appetite.  Increased thirst Hematologic/Lymphatic:  No anemia, purpura, petechiae. Allergic/Immunologic: No itchy/runny eyes, recent allergic reactions, rashes  ALLERGIES: No Known Allergies  HOME MEDICATIONS:  Current outpatient prescriptions:  .  alendronate (FOSAMAX) 70 MG tablet, , Disp: , Rfl: 3 .  calcium carbonate (OS-CAL) 600 MG TABS tablet, Take 600 mg by mouth at bedtime., Disp: , Rfl:  .  calcium carbonate 1250 MG capsule, Take 1,250 mg by mouth 2 (two) times daily with a meal., Disp: , Rfl:  .  cephALEXin (KEFLEX) 250 MG capsule, , Disp: , Rfl:  .  cholecalciferol (VITAMIN D) 1000 UNITS tablet, Take 1,000 Units by mouth daily., Disp: , Rfl:  .  cycloSPORINE (RESTASIS) 0.05 % ophthalmic emulsion, Place 1 drop into both eyes 2 (two) times daily., Disp: , Rfl:  .  DULoxetine (CYMBALTA) 30 MG capsule, Take 30 mg by mouth daily., Disp: , Rfl:  .  etodolac (LODINE) 300 MG capsule, Take 1 capsule (300 mg total) by mouth 2 (two) times daily., Disp: 60 capsule, Rfl: 3 .  lisinopril (PRINIVIL,ZESTRIL) 20 MG tablet, Take 20 mg by mouth daily., Disp: , Rfl:  .  Multiple Vitamin (MULTIVITAMIN WITH MINERALS) TABS tablet, Take 1 tablet by mouth daily., Disp: , Rfl:  .  omega-3 acid ethyl esters (LOVAZA) 1 G capsule, Take 1 g by mouth 2 (two) times daily., Disp: , Rfl:  .  simvastatin (ZOCOR) 20 MG tablet, Take 20 mg by mouth daily., Disp: , Rfl:  .  thyroid (ARMOUR) 60  MG tablet, Take 60 mg by mouth daily before breakfast., Disp: , Rfl:  .  trimethoprim (TRIMPEX) 100 MG tablet, Take 100 mg by mouth 2 (two) times daily., Disp: , Rfl:  .  trospium (SANCTURA) 20 MG tablet, Take 20 mg by mouth 2 (two) times daily., Disp: , Rfl:  .  vitamin C (ASCORBIC ACID) 250 MG tablet, Take 250 mg by mouth daily., Disp: , Rfl:  .  aspirin EC 81 MG tablet, Take 81 mg by mouth daily., Disp: , Rfl:  .  pantoprazole (PROTONIX) 40 MG tablet, Take 40 mg by mouth daily., Disp: , Rfl: 1  PAST MEDICAL HISTORY: Past Medical History  Diagnosis Date  . Hearing loss     . Vision abnormalities   . Cancer   . Headache   . Hypertension     PAST SURGICAL HISTORY: Past Surgical History  Procedure Laterality Date  . Partial hysterectomy    . Tonsillectomy    . Appendectomy    . Craniectomy for excision of acoustic neuroma      FAMILY HISTORY: Family History  Problem Relation Age of Onset  . Congestive Heart Failure Mother   . Heart disease Father     SOCIAL HISTORY:  History   Social History  . Marital Status: Married    Spouse Name: N/A  . Number of Children: N/A  . Years of Education: N/A   Occupational History  . Not on file.   Social History Main Topics  . Smoking status: Never Smoker   . Smokeless tobacco: Not on file  . Alcohol Use: No  . Drug Use: No  . Sexual Activity: Not on file   Other Topics Concern  . Not on file   Social History Narrative     PHYSICAL EXAM  Filed Vitals:   08/07/14 1310  BP: 138/84  Pulse: 76  Resp: 14  Height: '5\' 5"'  (1.651 m)  Weight: 150 lb 3.2 oz (68.13 kg)    Body mass index is 24.99 kg/(m^2).   General: The patient is well-developed and well-nourished and in no acute distress  Neck: The neck is supple, no carotid bruits are noted.  The neck is mildy tender at left C2-C4 paraspinal muscles .     Skin: Extremities are without significant edema, rash.  No sclerodactyly  Musculoskeletal:  Back is non-tender.   Tender over left AC joint with good shoulder ROM  Neurologic Exam  Mental status: The patient is alert and oriented x 3 at the time of the examination. The patient has normal recent and remote memory, with an apparently normal attention span and concentration ability.   Speech is normal.  Cranial nerves: Extraocular movements are full.  Facial symmetry is present. Facial strength is normal.  Trapezius and sternocleidomastoid strength is normal. No dysarthria is noted.  The tongue is midline, and the patient has symmetric elevation of the soft palate. No obvious hearing  deficits are noted.  Motor:  Muscle bulk is normal.   Tone is normal. Strength is  5 / 5 in all 4 extremities.   Sensory: Sensory testing is intact to soft touch in all 4 extremities.  Coordination: Cerebellar testing reveals good finger-nose-finger  bilaterally.  Gait and station: Station is normal.   Gait is normal.   Reflexes: Deep tendon reflexes are symmetric and normal bilaterally.     DIAGNOSTIC DATA (LABS, IMAGING, TESTING) - I reviewed patient records, labs, notes, testing and imaging myself where available.  ASSESSMENT AND PLAN  Other headache syndrome - Plan: Sjogren's syndrome antibods(ssa + ssb), Sedimentation Rate  Neck pain - Plan: Sjogren's syndrome antibods(ssa + ssb), Sedimentation Rate  OSA (obstructive sleep apnea) - Plan: Sjogren's syndrome antibods(ssa + ssb), Sedimentation Rate  Dry mouth - Plan: Sjogren's syndrome antibods(ssa + ssb), Sedimentation Rate  Dry eyes, bilateral - Plan: Sedimentation Rate   1.   As she has joint pain with dry eyes/mouth, I'll check Sjogren's antibodies and ESR 2.  Continue etodolac 3.  If neck pain or shoulder pain worsens, she will call us back, otherwise rtc 5 months for scheduled visit.      Lequan Dobratz A. Felecia Shelling, MD, PhD 06/08/7503, 1:83 PM Certified in Neurology, Clinical Neurophysiology, Sleep Medicine, Pain Medicine and Neuroimaging  [facility] [address] [place], [place] [address] [phone]

## 2014-08-08 LAB — SEDIMENTATION RATE: SED RATE: 6 mm/h (ref 0–40)

## 2014-08-08 LAB — SJOGREN'S SYNDROME ANTIBODS(SSA + SSB): ENA SSB (LA) Ab: <0.2 AI (ref 0.0–0.9)

## 2014-08-10 ENCOUNTER — Telehealth: Payer: Self-pay | Admitting: *Deleted

## 2014-08-10 NOTE — Telephone Encounter (Signed)
-----   Message from [name], MD sent at 08/08/2014  6:37 PM EDT ----- Please let her know Sjogrens antibodies were negative

## 2014-08-10 NOTE — Telephone Encounter (Signed)
Spoke with Cynthia [name] and per RAS, advised Sjogren's ab were neg.  She verbalized understanding of same/fim

## 2014-08-14 ENCOUNTER — Encounter: Payer: Self-pay | Admitting: Physical Therapy

## 2014-08-14 ENCOUNTER — Ambulatory Visit: Payer: Medicare Other | Attending: Neurology | Admitting: Physical Therapy

## 2014-08-14 DIAGNOSIS — M542 Cervicalgia: Secondary | ICD-10-CM | POA: Insufficient documentation

## 2014-08-14 DIAGNOSIS — R29898 Other symptoms and signs involving the musculoskeletal system: Secondary | ICD-10-CM | POA: Diagnosis not present

## 2014-08-14 NOTE — Therapy (Signed)
[place], [place], [address] Phone: [phone]   Fax:  [phone]  Physical Therapy Treatment  Patient Details  Name: Cynthia [name] MRN: [id number] Date of Birth: [birth year] Referring Provider:  Karl Luke, MD  Encounter Date: 08/14/2014      PT End of Session - 08/14/14 1426    Visit Number 4   Date for PT Re-Evaluation 09/05/14   PT Start Time 2585   PT Stop Time 1500   PT Time Calculation (min) 62 min      Past Medical History  Diagnosis Date  . Hearing loss   . Vision abnormalities   . Cancer   . Headache   . Hypertension     Past Surgical History  Procedure Laterality Date  . Partial hysterectomy    . Tonsillectomy    . Appendectomy    . Craniectomy for excision of acoustic neuroma      There were no vitals filed for this visit.  Visit Diagnosis:  Pain in neck  Upper extremity weakness      Subjective Assessment - 08/14/14 1359    Subjective The last treatment was really wonderful.   Currently in Pain? Yes   Pain Score 2    Pain Location Neck   Pain Orientation Mid   Pain Descriptors / Indicators Tightness   Aggravating Factors  turning head   Pain Relieving Factors the last treatment                         [facility] Adult PT Treatment/Exercise - 08/14/14 0001    Neck Exercises: Machines for Strengthening   UBE (Upper Arm Bike) Level 3 x 4 minutes   Other Machines for Strengthening 3# shrugs, bent over rows and extensions   Neck Exercises: Theraband   Scapula Retraction 20 reps;Red   Shoulder Extension 20 reps;Red   Other Theraband Exercises cervical retraction supine head on ball   Moist Heat Therapy   Number Minutes Moist Heat 15 Minutes   Moist Heat Location Other (comment)   Electrical Stimulation   Electrical Stimulation Location neck/upper thoracic spine   Electrical Stimulation Parameters IFC   Electrical Stimulation Goals Pain   Traction   Type of Traction Cervical   Min (lbs) 13   Max (lbs) 13   Hold Time static   Time 15   Manual Therapy   Manual Therapy Myofascial release;Manual Traction   Myofascial Release cervical and upper traps                     PT Long Term Goals - 07/25/14 1354    PT LONG TERM GOAL #1   Title independent with HEP (09/05/14)   Time 6   Period Weeks   Status New   PT LONG TERM GOAL #2   Title verbalize understanding of posture and body mechanics to reduce risk of reinjury (09/05/14)   Time 6   Period Weeks   Status New   PT LONG TERM GOAL #3   Title improve cervical rotation to > 60 degrees for improved ability to drive without pain (2/77/82)   Time 6   Period Weeks   Status New   PT LONG TERM GOAL #4   Title report pain < 2/10 with activity for improved function (09/05/14)   Time 6   Period Weeks   Status New  Plan - 08/14/14 1427    Clinical Impression Statement Tender with knots present in the left upper trap, and cervical area, she reports better ability to drive car.     PT Next Visit Plan Work with her on advanced HEP   Consulted and Agree with Plan of Care Patient        Problem List Patient Active Problem List   Diagnosis Date Noted  . Dry mouth 08/07/2014  . Dry eyes 08/07/2014  . Other headache syndrome 06/25/2014  . Sinusitis, chronic 06/25/2014  . Neck pain 06/25/2014  . OSA (obstructive sleep apnea) 06/25/2014  . Essential hypertension, benign 06/25/2014    Sumner Boast, PT 08/14/2014, 2:29 PM  [place] [address] [place], [place], [address] Phone: [phone]   Fax:  [phone]

## 2014-08-16 ENCOUNTER — Encounter: Payer: Self-pay | Admitting: Physical Therapy

## 2014-08-16 ENCOUNTER — Ambulatory Visit: Payer: Medicare Other | Admitting: Physical Therapy

## 2014-08-16 DIAGNOSIS — M542 Cervicalgia: Secondary | ICD-10-CM

## 2014-08-16 DIAGNOSIS — R29898 Other symptoms and signs involving the musculoskeletal system: Secondary | ICD-10-CM | POA: Diagnosis not present

## 2014-08-16 NOTE — Therapy (Signed)
[place], [place], [address] Phone: [phone]   Fax:  [phone]  Physical Therapy Treatment  Patient Details  Name: Cynthia [name] MRN: [id number] Date of Birth: 25-Jan-1941 Referring Provider:  Karl Luke, MD  Encounter Date: 08/16/2014      PT End of Session - 08/16/14 1341    Visit Number 5   Date for PT Re-Evaluation 09/05/14   PT Start Time 1311   PT Stop Time 1415   PT Time Calculation (min) 64 min      Past Medical History  Diagnosis Date  . Hearing loss   . Vision abnormalities   . Cancer   . Headache   . Hypertension     Past Surgical History  Procedure Laterality Date  . Partial hysterectomy    . Tonsillectomy    . Appendectomy    . Craniectomy for excision of acoustic neuroma      There were no vitals filed for this visit.  Visit Diagnosis:  Pain in neck  Upper extremity weakness      Subjective Assessment - 08/16/14 1313    Subjective This is really helping, I have fewer and less severe HA's and pain and am taking less medicine   Currently in Pain? Yes   Pain Score 1    Pain Location Neck   Pain Orientation Mid   Aggravating Factors  head motions   Pain Relieving Factors the treatment here            [facility] PT Assessment - 08/16/14 0001    AROM   Cervical Extension 50   Cervical - Right Side Bend 35   Cervical - Left Side Bend 30   Cervical - Right Rotation 60   Cervical - Left Rotation 60   Strength   Right Shoulder Flexion 4-/5   Left Shoulder Flexion 4-/5                     [facility] Adult PT Treatment/Exercise - 08/16/14 0001    Neck Exercises: Machines for Strengthening   UBE (Upper Arm Bike) Level 4 x 4 minutes   Cybex Row 15# 2x10   Other Machines for Strengthening 3# shrugs, bent over rows and extensions   Other Machines for Strengthening lats 15#   Neck Exercises: Theraband   Other Theraband Exercises cervical  retraction supine head on ball   Electrical Stimulation   Electrical Stimulation Location neck/upper thoracic spine   Electrical Stimulation Parameters IFC   Electrical Stimulation Goals Pain   Traction   Type of Traction Cervical   Min (lbs) 14   Max (lbs) 14   Hold Time static   Time 15   Manual Therapy   Manual Therapy Myofascial release;Manual Traction   Myofascial Release cervical and upper traps                     PT Long Term Goals - 08/16/14 1351    PT LONG TERM GOAL #1   Title independent with HEP (09/05/14)   Status On-going   PT LONG TERM GOAL #2   Title verbalize understanding of posture and body mechanics to reduce risk of reinjury (09/05/14)   Status On-going   PT LONG TERM GOAL #3   Title improve cervical rotation to > 60 degrees for improved ability to drive without pain (3/71/69)   Status On-going  Plan - 08/16/14 1349    Clinical Impression Statement Large knots and tenderness in the left upper trap and into the cervical paraspinals, ROM is improving   PT Next Visit Plan Continue to work on the ROM and strength   Consulted and Agree with Plan of Care Patient        Problem List Patient Active Problem List   Diagnosis Date Noted  . Dry mouth 08/07/2014  . Dry eyes 08/07/2014  . Other headache syndrome 06/25/2014  . Sinusitis, chronic 06/25/2014  . Neck pain 06/25/2014  . OSA (obstructive sleep apnea) 06/25/2014  . Essential hypertension, benign 06/25/2014    Sumner Boast, PT 08/16/2014, 1:52 PM  [address] [place], [place], [address] Phone: [phone]   Fax:  [phone]

## 2014-08-21 ENCOUNTER — Ambulatory Visit: Payer: Medicare Other | Admitting: Physical Therapy

## 2014-08-22 DIAGNOSIS — J320 Chronic maxillary sinusitis: Secondary | ICD-10-CM | POA: Diagnosis not present

## 2014-08-22 DIAGNOSIS — J323 Chronic sphenoidal sinusitis: Secondary | ICD-10-CM | POA: Diagnosis not present

## 2014-08-22 DIAGNOSIS — R339 Retention of urine, unspecified: Secondary | ICD-10-CM | POA: Diagnosis not present

## 2014-08-22 DIAGNOSIS — N8110 Cystocele, unspecified: Secondary | ICD-10-CM | POA: Diagnosis not present

## 2014-08-28 ENCOUNTER — Ambulatory Visit: Payer: Medicare Other | Admitting: Physical Therapy

## 2014-09-03 ENCOUNTER — Encounter: Payer: Self-pay | Admitting: Physical Therapy

## 2014-09-03 ENCOUNTER — Ambulatory Visit: Payer: Medicare Other | Admitting: Physical Therapy

## 2014-09-03 DIAGNOSIS — R29898 Other symptoms and signs involving the musculoskeletal system: Secondary | ICD-10-CM

## 2014-09-03 DIAGNOSIS — M542 Cervicalgia: Secondary | ICD-10-CM | POA: Diagnosis not present

## 2014-09-03 NOTE — Therapy (Signed)
[place], [place], [address] Phone: [phone]   Fax:  [phone]  Physical Therapy Treatment  Patient Details  Name: Cynthia [name] MRN: [id number] Date of Birth: 02-01-1941 Referring Provider:  Karl Luke, MD  Encounter Date: 09/03/2014      PT End of Session - 09/03/14 1607    Visit Number 6   Date for PT Re-Evaluation 09/05/14   PT Start Time 1525   PT Stop Time 1620   PT Time Calculation (min) 55 min      Past Medical History  Diagnosis Date  . Hearing loss   . Vision abnormalities   . Cancer   . Headache   . Hypertension     Past Surgical History  Procedure Laterality Date  . Partial hysterectomy    . Tonsillectomy    . Appendectomy    . Craniectomy for excision of acoustic neuroma      There were no vitals filed for this visit.  Visit Diagnosis:  Pain in neck  Upper extremity weakness      Subjective Assessment - 09/03/14 1531    Subjective A little more HA and pain possibly due to "allergies and sinus"   Currently in Pain? Yes   Pain Score 4    Pain Location Neck   Pain Orientation Mid   Pain Descriptors / Indicators Tightness   Pain Type Chronic pain   Aggravating Factors  worse at night   Pain Relieving Factors treatment helps                         [facility] Adult PT Treatment/Exercise - 09/03/14 0001    Neck Exercises: Machines for Strengthening   UBE (Upper Arm Bike) Level 4 x 4 minutes   Cybex Row 20# 2x15   Other Machines for Strengthening 3# shrugs, bent over rows and extensions, pulleys 5# scapular stabilization 2 ways, rhythmic stabilization   Other Machines for Strengthening lats 20#   Neck Exercises: Theraband   Scapula Retraction 20 reps;Red   Scapula Retraction Limitations also did cervical retraction   Electrical Stimulation   Electrical Stimulation Location neck/upper thoracic spine   Electrical Stimulation Parameters IFC    Electrical Stimulation Goals Pain   Manual Therapy   Manual Therapy Myofascial release;Manual Traction   Myofascial Release cervical and upper traps   Manual Traction occipital release                     PT Long Term Goals - 08/16/14 1351    PT LONG TERM GOAL #1   Title independent with HEP (09/05/14)   Status On-going   PT LONG TERM GOAL #2   Title verbalize understanding of posture and body mechanics to reduce risk of reinjury (09/05/14)   Status On-going   PT LONG TERM GOAL #3   Title improve cervical rotation to > 60 degrees for improved ability to drive without pain (0/24/09)   Status On-going               Plan - 09/03/14 1607    Clinical Impression Statement Has some decrease in knot size after STM, used an ice probe today   PT Next Visit Plan Continue to work on the ROM and strength   Consulted and Agree with Plan of Care Patient        Problem List Patient Active Problem List   Diagnosis Date Noted  .  Dry mouth 08/07/2014  . Dry eyes 08/07/2014  . Other headache syndrome 06/25/2014  . Sinusitis, chronic 06/25/2014  . Neck pain 06/25/2014  . OSA (obstructive sleep apnea) 06/25/2014  . Essential hypertension, benign 06/25/2014    Sumner Boast., PT 09/03/2014, 4:08 PM  [address] [place], [place], [address] Phone: [phone]   Fax:  [phone]

## 2014-09-11 ENCOUNTER — Telehealth: Payer: Self-pay | Admitting: Neurology

## 2014-09-11 NOTE — Telephone Encounter (Signed)
Patient called and requested an authorization from Dr. Felecia Shelling on the Rx. TIZANIDINE. She stated that the original Rx was written by Dr. Everette Rank but Dr. Everette Rank referred the patient to Dr. Felecia Shelling. Now she needs a refill for the medication and would like to know if Dr. Felecia Shelling would give his authorization on it. Please call and advise.

## 2014-09-12 MED ORDER — TIZANIDINE HCL 4 MG PO TABS: ORAL_TABLET | ORAL | Status: DC

## 2014-09-12 MED ORDER — TIZANIDINE HCL 4 MG PO TABS
ORAL_TABLET | ORAL | Status: DC
Start: 2014-09-12 — End: 2014-09-12

## 2014-09-12 NOTE — Telephone Encounter (Signed)
Rx. escribed to [facility] on [address] as requested/fim

## 2014-09-12 NOTE — Telephone Encounter (Signed)
Patient called stating the refill for tiZANidine (ZANAFLEX) 4 MG tablet should be called to [facility] [address]. Patient can be reached at [phone].

## 2014-09-12 NOTE — Telephone Encounter (Signed)
Per RAS, ok for Tizanidine rx. 4mg  #90, one po tid with 5 r/f.  I lmom to let her know I have escribed [facility] to [facility] [address]/fim

## 2014-09-13 ENCOUNTER — Ambulatory Visit: Payer: Medicare Other | Attending: Neurology | Admitting: Physical Therapy

## 2014-09-13 ENCOUNTER — Encounter: Payer: Self-pay | Admitting: Physical Therapy

## 2014-09-13 DIAGNOSIS — M542 Cervicalgia: Secondary | ICD-10-CM

## 2014-09-13 DIAGNOSIS — R29898 Other symptoms and signs involving the musculoskeletal system: Secondary | ICD-10-CM | POA: Insufficient documentation

## 2014-09-13 NOTE — Therapy (Signed)
[place] [address] [place], [place], [address] Phone: [phone]   Fax:  [phone]  September 13, 2014   Physical Therapy Discharge Summary  Patient: Cynthia [name]  MRN: [id number]  Date of Birth: Dec 11, 1940   Diagnosis: Pain in neck  Upper extremity weakness Referring Provider:  Karl Luke, MD  The above patient had been seen in Physical Therapy 7 times   The patient is: improved  Subjective: Overall she reports that she is better, doing more with less HA's, still some pain and knots   Functional Status at Discharge: Still some spasms and knots in the upper trap and cervical area  Goals are met with decreased pain, increased ROM and function and less pain at night      Plan - 09/13/14 1555    Clinical Impression Statement knot is smaller, has better ROM of the cervical spine, reports less pain at night   PT Next Visit Plan will d/c as she is going to do HEP on her own   Consulted and Agree with Plan of Care Patient      Sincerely,   Sumner Boast, [place], [place], [address] Phone: [phone]   Fax:  [phone]

## 2014-10-01 DIAGNOSIS — R351 Nocturia: Secondary | ICD-10-CM | POA: Diagnosis not present

## 2014-10-01 DIAGNOSIS — N3946 Mixed incontinence: Secondary | ICD-10-CM | POA: Diagnosis not present

## 2014-10-01 DIAGNOSIS — R339 Retention of urine, unspecified: Secondary | ICD-10-CM | POA: Diagnosis not present

## 2014-10-01 DIAGNOSIS — N3020 Other chronic cystitis without hematuria: Secondary | ICD-10-CM | POA: Diagnosis not present

## 2014-10-17 DIAGNOSIS — R935 Abnormal findings on diagnostic imaging of other abdominal regions, including retroperitoneum: Secondary | ICD-10-CM | POA: Diagnosis not present

## 2014-10-17 DIAGNOSIS — N1330 Unspecified hydronephrosis: Secondary | ICD-10-CM | POA: Diagnosis not present

## 2014-11-08 DIAGNOSIS — R3989 Other symptoms and signs involving the genitourinary system: Secondary | ICD-10-CM | POA: Diagnosis not present

## 2014-11-09 ENCOUNTER — Other Ambulatory Visit: Payer: Self-pay | Admitting: Neurology

## 2014-11-14 ENCOUNTER — Telehealth: Payer: Self-pay | Admitting: Neurology

## 2014-11-14 NOTE — Telephone Encounter (Signed)
Patient is calling to discuss medication tiZANidine (ZANAFLEX) 4 MG tablet. Patient now has a UTI which her PCP prescribed Cipro 500mg  twice daily. The pharmacist told the patient that the Cipro will increase the power of the tiZANidine (ZANAFLEX) 4 MG tablet. The patient is not sure what she should do. Please call and advise. Thank you.

## 2014-11-15 DIAGNOSIS — E049 Nontoxic goiter, unspecified: Secondary | ICD-10-CM | POA: Diagnosis not present

## 2014-11-15 DIAGNOSIS — I10 Essential (primary) hypertension: Secondary | ICD-10-CM | POA: Diagnosis not present

## 2014-11-15 NOTE — Telephone Encounter (Signed)
Please tell her to cut tizanidine in half while she takes cipro

## 2014-11-15 NOTE — Telephone Encounter (Signed)
Spoke w/ pt and advised her to only take 2mg  of tizanidine (Zanaflex) (1/2 dose) while she is taking the Cipro, instead of the 4mg . Pt verbalized understanding and has no further questions at this time.

## 2014-11-22 DIAGNOSIS — E039 Hypothyroidism, unspecified: Secondary | ICD-10-CM | POA: Diagnosis not present

## 2014-11-30 DIAGNOSIS — H2513 Age-related nuclear cataract, bilateral: Secondary | ICD-10-CM | POA: Diagnosis not present

## 2014-12-03 ENCOUNTER — Other Ambulatory Visit: Payer: Self-pay

## 2014-12-03 DIAGNOSIS — Z1231 Encounter for screening mammogram for malignant neoplasm of breast: Secondary | ICD-10-CM

## 2014-12-20 DIAGNOSIS — N8110 Cystocele, unspecified: Secondary | ICD-10-CM | POA: Diagnosis not present

## 2014-12-20 DIAGNOSIS — N3020 Other chronic cystitis without hematuria: Secondary | ICD-10-CM | POA: Diagnosis not present

## 2014-12-28 DIAGNOSIS — R3989 Other symptoms and signs involving the genitourinary system: Secondary | ICD-10-CM | POA: Diagnosis not present

## 2014-12-28 DIAGNOSIS — N3020 Other chronic cystitis without hematuria: Secondary | ICD-10-CM | POA: Diagnosis not present

## 2015-01-04 DIAGNOSIS — N3020 Other chronic cystitis without hematuria: Secondary | ICD-10-CM | POA: Diagnosis not present

## 2015-01-04 DIAGNOSIS — N8110 Cystocele, unspecified: Secondary | ICD-10-CM | POA: Diagnosis not present

## 2015-01-07 ENCOUNTER — Encounter: Payer: Self-pay | Admitting: Neurology

## 2015-01-07 ENCOUNTER — Ambulatory Visit (INDEPENDENT_AMBULATORY_CARE_PROVIDER_SITE_OTHER): Payer: Medicare Other | Admitting: Neurology

## 2015-01-07 VITALS — BP 126/74 | HR 72 | Resp 14 | Ht 65.0 in | Wt 149.4 lb

## 2015-01-07 DIAGNOSIS — M542 Cervicalgia: Secondary | ICD-10-CM | POA: Diagnosis not present

## 2015-01-07 DIAGNOSIS — G4489 Other headache syndrome: Secondary | ICD-10-CM | POA: Diagnosis not present

## 2015-01-07 DIAGNOSIS — H04123 Dry eye syndrome of bilateral lacrimal glands: Secondary | ICD-10-CM

## 2015-01-07 DIAGNOSIS — G4733 Obstructive sleep apnea (adult) (pediatric): Secondary | ICD-10-CM

## 2015-01-07 NOTE — Progress Notes (Signed)
[facility]  PATIENT: Cynthia [name] DOB: Feb 27, 1941 _________________________________   HISTORICAL  CHIEF COMPLAINT:  Chief Complaint  Patient presents with  . Headache    Sts. neck pain is improved--still just a little pain left side of neck . Sts. h/a's are doing well with Etodolac.  Sts. she is compliant with CPAP for osa.  She gets her supplies thru [facility]/fim  . Neck Pain  . Sleep Apnea    HISTORY OF PRESENT ILLNESS:  Cynthia [name] is a 74 yo woman with left neck pain and headache.  On etodolac and tizanidine, her HA's are much better, neck pain is better and sleep quality is better.    She tolerates the medications well.   No GI upset and only minimal sleepiness early morning.     She has had no severe headaches.  No visual changes, numbness or weakness.   Mild still occur and are above the right eye and temple region.    She notes some right > left neck and shoulder pain that is more recent.  Neck pain often intensifies as headache increases.   She notes doing a lot of seated computer work.     She also reports dry eyes.  SSA/SSB were normal.  Restasis has helped her dry eyes.   Joint pain is better on her med's.  l.   No rashes but she has more itching.     She has OSA and is on CPAP.   She continues to wear nightly and notes no changes in therapy.   She feels better when she sleeps the whole night with it.     REVIEW OF SYSTEMS: Constitutional: No fevers, chills, sweats, or change in appetite Eyes: No visual changes, double vision, eye pain Ear, nose and throat: Chronic sinusutis.  Bilat hearing loss, worse on left  Cardiovascular: No chest pain, palpitations Respiratory: No shortness of breath at rest or with exertion.   No wheezes.  Has OSA GastrointestinaI: No nausea, vomiting, diarrhea, abdominal pain, fecal incontinence.   Occ dysphagia Genitourinary: Frequent UTIs.  SOme frequency. Musculoskeletal: as above Integumentary: No rash,  pruritus, skin lesions Neurological: as above Psychiatric: No depression at this time.  No anxiety Endocrine: No palpitations, diaphoresis, change in appetite.  Increased thirst Hematologic/Lymphatic: No anemia, purpura, petechiae. Allergic/Immunologic: No itchy/runny eyes, recent allergic reactions, rashes  ALLERGIES: No Known Allergies  HOME MEDICATIONS:  Current outpatient prescriptions:  .  calcium carbonate 1250 MG capsule, Take 1,250 mg by mouth 2 (two) times daily with a meal., Disp: , Rfl:  .  cholecalciferol (VITAMIN D) 1000 UNITS tablet, Take 1,000 Units by mouth daily., Disp: , Rfl:  .  cycloSPORINE (RESTASIS) 0.05 % ophthalmic emulsion, Place 1 drop into both eyes 2 (two) times daily., Disp: , Rfl:  .  DULoxetine (CYMBALTA) 30 MG capsule, Take 30 mg by mouth daily., Disp: , Rfl:  .  etodolac (LODINE) 300 MG capsule, TAKE 1 CAPSULE (300 MG TOTAL) BY MOUTH 2 (TWO) TIMES DAILY., Disp: 60 capsule, Rfl: 1 .  lisinopril (PRINIVIL,ZESTRIL) 20 MG tablet, Take 20 mg by mouth daily., Disp: , Rfl:  .  Multiple Vitamin (MULTIVITAMIN WITH MINERALS) TABS tablet, Take 1 tablet by mouth daily., Disp: , Rfl:  .  omega-3 acid ethyl esters (LOVAZA) 1 G capsule, Take 1 g by mouth 2 (two) times daily., Disp: , Rfl:  .  pantoprazole (PROTONIX) 40 MG tablet, Take 40 mg by mouth daily., Disp: , Rfl: 1 .  simvastatin (ZOCOR) 20 MG tablet, Take 20 mg by mouth daily., Disp: , Rfl:  .  thyroid (ARMOUR) 60 MG tablet, Take 60 mg by mouth daily before breakfast., Disp: , Rfl:  .  tiZANidine (ZANAFLEX) 4 MG tablet, Take one tablet 3 times daily as needed., Disp: 90 tablet, Rfl: 5 .  vitamin C (ASCORBIC ACID) 250 MG tablet, Take 250 mg by mouth daily., Disp: , Rfl:  .  alendronate (FOSAMAX) 70 MG tablet, , Disp: , Rfl: 3 .  aspirin EC 81 MG tablet, Take 81 mg by mouth daily., Disp: , Rfl:  .  calcium carbonate (OS-CAL) 600 MG TABS tablet, Take 600 mg by mouth at bedtime., Disp: , Rfl:  .  cephALEXin  (KEFLEX) 250 MG capsule, , Disp: , Rfl:  .  trimethoprim (TRIMPEX) 100 MG tablet, Take 100 mg by mouth 2 (two) times daily., Disp: , Rfl:  .  trospium (SANCTURA) 20 MG tablet, Take 20 mg by mouth 2 (two) times daily., Disp: , Rfl:   PAST MEDICAL HISTORY: Past Medical History  Diagnosis Date  . Hearing loss   . Vision abnormalities   . Cancer   . Headache   . Hypertension     PAST SURGICAL HISTORY: Past Surgical History  Procedure Laterality Date  . Partial hysterectomy    . Tonsillectomy    . Appendectomy    . Craniectomy for excision of acoustic neuroma      FAMILY HISTORY: Family History  Problem Relation Age of Onset  . Congestive Heart Failure Mother   . Heart disease Father     SOCIAL HISTORY:  Social History   Social History  . Marital Status: Married    Spouse Name: N/A  . Number of Children: N/A  . Years of Education: N/A   Occupational History  . Not on file.   Social History Main Topics  . Smoking status: Never Smoker   . Smokeless tobacco: Not on file  . Alcohol Use: No  . Drug Use: No  . Sexual Activity: Not on file   Other Topics Concern  . Not on file   Social History Narrative     PHYSICAL EXAM  Filed Vitals:   01/07/15 1341  BP: 126/74  Pulse: 72  Resp: 14  Height: 5\' 5"  (1.651 m)  Weight: 149 lb 6.4 oz (67.767 kg)    Body mass index is 24.86 kg/(m^2).   General: The patient is well-developed and well-nourished and in no acute distress  Neck: The neck is supple, no carotid bruits are noted.  The neck is mildy tender at left C2-C4 paraspinal muscles .     Skin: Extremities are without significant edema, rash.  No sclerodactyly  Musculoskeletal:  Back is non-tender.   Mildly tender over left trapezius  with good shoulder ROM  Neurologic Exam  Mental status: The patient is alert and oriented x 3 at the time of the examination. The patient has normal recent and remote memory, with an apparently normal attention span and  concentration ability.   Speech is normal.  Cranial nerves: Extraocular movements are full.   Facial strength is normal.  Trapezius and sternocleidomastoid strength is normal. No dysarthria is noted.    Decreased hearing on left  Motor:  Muscle bulk is normal.   Tone is normal. Strength is  5 / 5 in all 4 extremities.   Sensory: Sensory testing is intact to soft touch in all 4 extremities.  Coordination: Cerebellar testing reveals good finger-nose-finger  bilaterally.  Gait and station: Station is normal.   Gait is normal.   Tandem is wide  Reflexes: Deep tendon reflexes are symmetric and normal bilaterally.     DIAGNOSTIC DATA (LABS, IMAGING, TESTING) - I reviewed patient records, labs, notes, testing and imaging myself where available.      ASSESSMENT AND PLAN  Other headache syndrome  Neck pain  Dry eyes, bilateral  OSA (obstructive sleep apnea)   1.  Continue etodolac and tizanidine 2.  Remain active 3.  If neck pain or shoulder pain worsens, she will call us back, or rtc 6 months for scheduled visit if stable   Richard A. Felecia Shelling, MD, PhD 10/08/3660, 9:47 PM Certified in Neurology, Clinical Neurophysiology, Sleep Medicine, Pain Medicine and Neuroimaging  [facility] [address], [place], [place] [address] [phone]

## 2015-01-08 ENCOUNTER — Ambulatory Visit: Payer: PRIVATE HEALTH INSURANCE

## 2015-01-11 ENCOUNTER — Ambulatory Visit
Admission: RE | Admit: 2015-01-11 | Discharge: 2015-01-11 | Disposition: A | Discharge status: Home / Self Care | Payer: Medicare Other | Source: Ambulatory Visit

## 2015-01-11 DIAGNOSIS — Z1231 Encounter for screening mammogram for malignant neoplasm of breast: Secondary | ICD-10-CM | POA: Diagnosis not present

## 2015-01-14 DIAGNOSIS — Z85820 Personal history of malignant melanoma of skin: Secondary | ICD-10-CM | POA: Diagnosis not present

## 2015-01-14 DIAGNOSIS — Z08 Encounter for follow-up examination after completed treatment for malignant neoplasm: Secondary | ICD-10-CM | POA: Diagnosis not present

## 2015-01-14 DIAGNOSIS — D1801 Hemangioma of skin and subcutaneous tissue: Secondary | ICD-10-CM | POA: Diagnosis not present

## 2015-01-14 DIAGNOSIS — D485 Neoplasm of uncertain behavior of skin: Secondary | ICD-10-CM | POA: Diagnosis not present

## 2015-01-14 DIAGNOSIS — D225 Melanocytic nevi of trunk: Secondary | ICD-10-CM | POA: Diagnosis not present

## 2015-01-18 ENCOUNTER — Other Ambulatory Visit: Payer: Self-pay | Admitting: Neurology

## 2015-01-18 DIAGNOSIS — I10 Essential (primary) hypertension: Secondary | ICD-10-CM | POA: Diagnosis not present

## 2015-01-18 DIAGNOSIS — E7800 Pure hypercholesterolemia, unspecified: Secondary | ICD-10-CM | POA: Diagnosis not present

## 2015-01-18 DIAGNOSIS — N390 Urinary tract infection, site not specified: Secondary | ICD-10-CM | POA: Diagnosis not present

## 2015-01-24 DIAGNOSIS — E7800 Pure hypercholesterolemia, unspecified: Secondary | ICD-10-CM | POA: Diagnosis not present

## 2015-01-24 DIAGNOSIS — N8110 Cystocele, unspecified: Secondary | ICD-10-CM | POA: Diagnosis not present

## 2015-01-24 DIAGNOSIS — Z1212 Encounter for screening for malignant neoplasm of rectum: Secondary | ICD-10-CM | POA: Diagnosis not present

## 2015-01-24 DIAGNOSIS — R7303 Prediabetes: Secondary | ICD-10-CM | POA: Diagnosis not present

## 2015-01-24 DIAGNOSIS — I10 Essential (primary) hypertension: Secondary | ICD-10-CM | POA: Diagnosis not present

## 2015-01-25 DIAGNOSIS — N8110 Cystocele, unspecified: Secondary | ICD-10-CM | POA: Diagnosis not present

## 2015-01-30 ENCOUNTER — Encounter (HOSPITAL_COMMUNITY): Payer: Self-pay

## 2015-02-06 DIAGNOSIS — N3941 Urge incontinence: Secondary | ICD-10-CM | POA: Diagnosis not present

## 2015-02-11 DIAGNOSIS — G4733 Obstructive sleep apnea (adult) (pediatric): Secondary | ICD-10-CM | POA: Diagnosis not present

## 2015-02-13 DIAGNOSIS — N3941 Urge incontinence: Secondary | ICD-10-CM | POA: Diagnosis not present

## 2015-02-20 DIAGNOSIS — N3941 Urge incontinence: Secondary | ICD-10-CM | POA: Diagnosis not present

## 2015-02-20 DIAGNOSIS — J323 Chronic sphenoidal sinusitis: Secondary | ICD-10-CM | POA: Diagnosis not present

## 2015-02-20 DIAGNOSIS — J320 Chronic maxillary sinusitis: Secondary | ICD-10-CM | POA: Diagnosis not present

## 2015-02-21 DIAGNOSIS — N8110 Cystocele, unspecified: Secondary | ICD-10-CM | POA: Diagnosis not present

## 2015-02-21 DIAGNOSIS — E7800 Pure hypercholesterolemia, unspecified: Secondary | ICD-10-CM | POA: Diagnosis not present

## 2015-02-21 DIAGNOSIS — Z791 Long term (current) use of non-steroidal anti-inflammatories (NSAID): Secondary | ICD-10-CM | POA: Diagnosis not present

## 2015-02-21 DIAGNOSIS — R7303 Prediabetes: Secondary | ICD-10-CM | POA: Diagnosis not present

## 2015-02-27 DIAGNOSIS — N3941 Urge incontinence: Secondary | ICD-10-CM | POA: Diagnosis not present

## 2015-02-28 ENCOUNTER — Other Ambulatory Visit: Payer: Self-pay | Admitting: Sports Medicine

## 2015-02-28 ENCOUNTER — Ambulatory Visit (INDEPENDENT_AMBULATORY_CARE_PROVIDER_SITE_OTHER): Payer: Medicare Other | Admitting: Sports Medicine

## 2015-02-28 ENCOUNTER — Ambulatory Visit
Admission: RE | Admit: 2015-02-28 | Discharge: 2015-02-28 | Disposition: A | Discharge status: Home / Self Care | Payer: Medicare Other | Source: Ambulatory Visit | Attending: Sports Medicine | Admitting: Sports Medicine

## 2015-02-28 ENCOUNTER — Encounter: Payer: Self-pay | Admitting: Sports Medicine

## 2015-02-28 VITALS — BP 141/64 | HR 75 | Ht 65.0 in | Wt 150.0 lb

## 2015-02-28 DIAGNOSIS — M50323 Other cervical disc degeneration at C6-C7 level: Secondary | ICD-10-CM | POA: Diagnosis not present

## 2015-02-28 DIAGNOSIS — M542 Cervicalgia: Secondary | ICD-10-CM

## 2015-02-28 DIAGNOSIS — M25572 Pain in left ankle and joints of left foot: Secondary | ICD-10-CM | POA: Diagnosis not present

## 2015-02-28 DIAGNOSIS — M25579 Pain in unspecified ankle and joints of unspecified foot: Secondary | ICD-10-CM | POA: Insufficient documentation

## 2015-02-28 NOTE — Progress Notes (Signed)
Subjective:    [name], female    DOB: [birth year], 74 y.o.   MRN: [id number]  HPI 74 y/o female with multiple medical problems presenting on referral from Dr. Shelia Media regarding LEFT-sided cervical spine pain and LEFT foot injury.  She is followed by Neurology and Neuropsychiatry for headache syndrome and cognitive/memory impairment.   #1. LEFT neck pain Denies any lifetime history of MVA, falls or other neck trauma.  She reports onset of LEFT neck/trapezius pain several years ago without inciting event or trauma.  She has baseline LEFT trapezius spasm with a constant cramping/aching sensation.  Her pain worsens with side bending to the right and rotation to the right.  Also worse with extension.    She has done 3 rounds of physical therapy in the past.  Acupuncture in the past.  Currently using massage 1-2 times weekly of the trapezius.  She currently gets most benefit from massage is but has her dull aching pain at all times which occasionally becomes sharp if she moves her head too sharply or quickly.  Only on tizanidine and etodolac for HA syndrome by neurology but she takes this chronically.   #2. LEFT foot pain Reports history of tendon rupture on the medial aspect of her LEFT foot approximately 4 years ago.  Followed by Dr. Alphonzo Severance for this injury and recommended AGAINST surgery given limited benefit and high likelihood of side-effects or complications.  She was treated with rehabilitation and orthotics.  To this day she still wears custom orthotic on the LEFT foot but is seeking a second opinion because of ongoing pain when wearing any shoes other than her current sneakers.  She also would like to see if this area could benefit from another type of orthotic or different approach altogether.   PMHx -- reviewed and updated in EMR; relevant portions impacting decision making: Headache syndrome, cognitive impairment/memory impairment PSHx -- reviewed and updated in EMR;  relevant portions impacting decision making: None Meds -- reviewed and updated in EMR; relevant portions impacting decision making: Tizanidine and etodolac chronic use for headache syndrome Allergies, Social Hx -- reviewed and updated in EMR   Review of Systems 10-point ROS was negative other than the above. Note memory loss is frequent with current issues and short term items Denies confusion No weakness in hand No radicular sxs to lower arm No hx of disk truama to the neck     Objective:   Physical Exam General: Resting comfortably on exam table and in no acute distress  BP 141/64 mmHg  Pulse 75  Ht 5\' 5"  (1.651 m)  Wt 150 lb (68.04 kg)  BMI 24.96 kg/m2  Neurologic: Distal sensory and motor exam in bilateral lower extremities is unremarkable  Cardiopulmonary: 2+ pulses in bilateral distal lower extremity; nonlabored respiration  Psychiatric exam: Normal mood, congruent affect. Appropriate thought content   NECK exam: The patient demonstrates side bending to 10 on the left and extension of the neck to 5 at baseline during sitting She has flexion to 60 extension to 20 rotation 30 on the LEFT and 45 on the right. Side bending to 30 on the right and 20 on the left 5/5 strength with neck extension, flexion, and trapezius elevation. 5/5 strength with shoulder internal rotation, external rotation, abduction and flexion/extension She is nontender to palpation of the spinous processes and transverse processes bilaterally. Her entire left trapezius demonstrates significant spasm at rest that does not improve with position changes She has a positive  Spurling's for worsening spasm/pain on the LEFT and has a positive traction test on the left  FOOT exam: On nonweightbearing inspection there is significant osteophyte formation/coalition of the base of the first and second metatarsals at their articulation with the medial and intermediate cuneiforms on the LEFT foot. With  nonweightbearing she has complete loss of the LEFT transverse arch and the LEFT longitudinal arch. There are Morton's callus is present over the metatarsal heads on the LEFT. With nonweightbearing the right foot demonstrates some mild pes planus and transverse arch collapse. There is less osteophyte formation at the articulation between the native forms and the metatarsal bases and no Morton's callus is present on plantar aspect of metatarsal heads  With standing, there is complete collapse of the bilateral transverse and longitudinal arches, more severe on the LEFT. Complete absence of longitudinal and transverse arch is on the left. The third digit bilaterally has been recruited by the lateral column. The patient has hammering of the third fourth and fifth digits on the RIGHT greater than LEFT foot.  With ambulation she has a pronated gait that improved after the application of a scaphoid pad on the LEFT foot. Her current orthotics/shoes push her into supination and allow her to have a relatively neutral foot strike with ambulation while wearing shoes, even prior to application of scaphoid pad  Her osteophytes are nontender to palpation. She has 5/5 strength with dorsiflexion and eversion of the foot. 4/5 strength with plantarflexion and inversion on the left. She demonstrates a negative Thompson test, anterior drawer and talar tilt.  There is no evidence of posterior tibialis function with plantar flexion while weightbearing (grade 3 dysfunction). The subtalar joint is fixed.     Assessment & Plan:   #1. 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 Given her memory and cognitive impairment currently followed by neurology and neuropsychology, very hesitant to prescribe her with gabapentin, pregabalin or  another medication that may negatively modify her neuronal transmission and further expedite memory impairment. -Capsaicin cream topically to be applied 3-4 times daily to the spasming trapezius and paraspinal musculature on the left -Cervical collar to be worn at least 4 hours daily while at home to offload the foraminal outlet stenosis -Shake out maneuvers in flexion, extension and abduction/adduction -Follow up in 4 weeks, for quick reassessment of neck pain and response to above. Focus on posterior tibialis rupture at follow-up  #2. Chronic foot pain secondary to posterior tibialis rupture Physical exam is most consistent with complete rupture of the LEFT posterior tibialis tendon as demonstrated by complete loss of transverse arch, great 3 posterior tibialis dysfunction. Do not think she would benefit from any surgical procedures or injection therapy. Will place scaphoid pad today which improved her gait but she reported was uncomfortable with the soft orthotics. -Scaphoid pad to be applied to her more rigid insoles -Weightbearing and supportive shoes and orthotics only, avoid barefoot walking or unsupportive footwear (flip-flops, sandals) -Follow up in 4 weeks to focus on temporary orthotics, metatarsal pads, scaphoid pads and further improving the biomechanics after following up on neck pain

## 2015-02-28 NOTE — Patient Instructions (Signed)
Neck pain / trapezius spasm --- exercises and plan: 1. Shake outs of the arms in flexion (forward/backward movement) and abudction (moving arms up/down from the side) --- perform 3x daily on most days  2. Capsacin cream to apply 2-3x daily to trapezius muscle -- DO NOT RUB EYES OR MOUTH for 15-20 minutes after application of cream  3. Wear cervical spine collar when around the house for at least 3-4 hours daily to off-load spine/irritated nerve  4. X-rays of neck today to assess degree of foramenal stenosis   Foot pain: 1. Please use pads given today, assess response.  Discuss further at your next follow-up appointment.

## 2015-02-28 NOTE — Assessment & Plan Note (Signed)
We nee to see about making other modifications for shoes or orthotics  She forgot to bring these (she actually brought mismatched orthotic insoles)  We will try to reassess in more detail when she returns with her current shoes and orthotics

## 2015-02-28 NOTE — Assessment & Plan Note (Signed)
See plan in OV

## 2015-03-06 ENCOUNTER — Telehealth: Payer: Self-pay | Admitting: Neurology

## 2015-03-06 DIAGNOSIS — N3941 Urge incontinence: Secondary | ICD-10-CM | POA: Diagnosis not present

## 2015-03-06 NOTE — Telephone Encounter (Signed)
Pt called in stating that she is out of refills for her Cymbalta.  She stated Dr. Felecia Shelling had prescribed it for her before but Dr. Valaria Good was on the refill. She stated she just needs a new rx sent over to her pharmacy within a day or so if possible.  I advised that I didn't see where Dr. Felecia Shelling was ever an ordering provider for it but that we would definitely take a look at it for her.

## 2015-03-06 NOTE — Telephone Encounter (Signed)
I called the pharmacy and spoke with [facility].  She verified this Rx was last written by Dr Everette Rank for Cymbalta 30mg  one twice daily.  Dr Everette Rank was a colleague of Dr Rexene Alberts at [facility].  Will forward request to provider for review.

## 2015-03-07 ENCOUNTER — Other Ambulatory Visit: Payer: Self-pay | Admitting: Neurology

## 2015-03-07 MED ORDER — DULOXETINE HCL 30 MG PO CPEP: 30.0000 mg | ORAL_CAPSULE | Freq: Two times a day (BID) | ORAL | Status: DC

## 2015-03-07 NOTE — Telephone Encounter (Signed)
Ok, I sent this in to [facility] pharmacy

## 2015-03-14 DIAGNOSIS — N3946 Mixed incontinence: Secondary | ICD-10-CM | POA: Diagnosis not present

## 2015-03-21 DIAGNOSIS — N3941 Urge incontinence: Secondary | ICD-10-CM | POA: Diagnosis not present

## 2015-03-28 DIAGNOSIS — N3941 Urge incontinence: Secondary | ICD-10-CM | POA: Diagnosis not present

## 2015-04-04 DIAGNOSIS — N3941 Urge incontinence: Secondary | ICD-10-CM | POA: Diagnosis not present

## 2015-04-11 DIAGNOSIS — N3941 Urge incontinence: Secondary | ICD-10-CM | POA: Diagnosis not present

## 2015-04-18 DIAGNOSIS — N3941 Urge incontinence: Secondary | ICD-10-CM | POA: Diagnosis not present

## 2015-04-18 DIAGNOSIS — Z0000 Encounter for general adult medical examination without abnormal findings: Secondary | ICD-10-CM | POA: Diagnosis not present

## 2015-04-22 DIAGNOSIS — Y93A9 Activity, other involving cardiorespiratory exercise: Secondary | ICD-10-CM

## 2015-04-22 NOTE — Congregational Nurse Program (Signed)
Congregational Nurse Program Note  Date of Encounter: 04/22/2015  Past Medical History: Past Medical History  Diagnosis Date  . Hearing loss   . Vision abnormalities   . Cancer ([place])   . Headache   . Hypertension     Encounter Details:     CNP Questionnaire - 04/22/15 1518    Patient Demographics   Is this a new or existing patient? Existing   Race Caucasian/White   Patient Assistance   Patient's financial/insurance status Medicare   Uninsured Patient No   Patient referred to apply for the following financial assistance Not Applicable   Food insecurities addressed Not Applicable   Transportation assistance No   Assistance securing medications No   Educational health offerings Exercise/physical activity   Encounter Details   Primary purpose of visit Education/Health Concerns   Was an Emergency Department visit averted? Not Applicable   Does patient have a medical provider? Yes   Patient referred to Not Applicable   Was a mental health screening completed? (GAINS tool) No   Does patient have dental issues? No   Since previous encounter, have you referred patient for abnormal blood pressure that resulted in a new diagnosis or medication change? No   Since previous encounter, have you referred patient for abnormal blood glucose that resulted in a new diagnosis or medication change? No   For Abstraction Use Only   Does patient have insurance? Yes    Attended Chair yoga class.

## 2015-04-25 DIAGNOSIS — N3020 Other chronic cystitis without hematuria: Secondary | ICD-10-CM | POA: Diagnosis not present

## 2015-04-25 DIAGNOSIS — N3946 Mixed incontinence: Secondary | ICD-10-CM | POA: Diagnosis not present

## 2015-04-26 DIAGNOSIS — N3020 Other chronic cystitis without hematuria: Secondary | ICD-10-CM | POA: Diagnosis not present

## 2015-05-13 ENCOUNTER — Encounter: Payer: Self-pay | Admitting: Sports Medicine

## 2015-05-13 ENCOUNTER — Ambulatory Visit (INDEPENDENT_AMBULATORY_CARE_PROVIDER_SITE_OTHER): Payer: Medicare Other | Admitting: Sports Medicine

## 2015-05-13 VITALS — BP 153/81 | HR 74 | Ht 65.0 in | Wt 150.0 lb

## 2015-05-13 DIAGNOSIS — M6789 Other specified disorders of synovium and tendon, multiple sites: Secondary | ICD-10-CM | POA: Diagnosis not present

## 2015-05-13 DIAGNOSIS — M79671 Pain in right foot: Secondary | ICD-10-CM

## 2015-05-13 DIAGNOSIS — M76829 Posterior tibial tendinitis, unspecified leg: Secondary | ICD-10-CM

## 2015-05-13 MED ORDER — NITROGLYCERIN 0.2 MG/HR TD PT24: MEDICATED_PATCH | TRANSDERMAL | Status: DC

## 2015-05-13 NOTE — Patient Instructions (Addendum)

## 2015-05-13 NOTE — Progress Notes (Signed)
  Cynthia [name] - 75 y.o. female MRN [id number]  Date of birth: May 16, 1940  SUBJECTIVE:  Including CC & ROS.  No chief complaint on file. Patient reports that she began experiencing R ankle pain approx 10 days ago. There was no inciting fall or trauma to the ankle. Pain located along the medial aspect of the ankle and has also begun to radiate to the Achilles. She describes the pain as a "soreness" that worsens with activity. She has been significantly limited in her activity level due to her pain and has started using a care for additional support while walking her dog. Has not noticed bruising or swelling around the ankle. Pain improves with ibuprofen and Advil. Patient has been icing her foot at night, but is unsure if this has helped. Has been wearing an ankle sleeve for additional support.   HISTORY: Past Medical, Surgical, Social, and Family History Reviewed & Updated per EMR.   Pertinent Historical Findings include: History of L ankle sprain approx 3-4 years ago.  No history of previous ankle injury or surgery.  DATA REVIEWED: [place] of R ankle with chronic degenerative changes to the R posterior tibialis without effusion  PHYSICAL EXAM:  VS: BP:(!) 153/81 mmHg  HR:74bpm  TEMP: ( )  RESP:   HT:5\' 5"  (165.1 cm)   WT:150 lb (68.04 kg)  BMI:25 PHYSICAL EXAM: Full ROM of ankle joints bilaterally Tenderness to palpation posterior to the R medial malleolus  R ankle pain reproducible while standing on tip toes Pes planus and calcaneous valgus noted bilaterally No ankle effusion or bruising   [facility] ultrasound of the medial right ankle shows a split tear through the posterior tibialis tendon just distal to the medial malleolus. There is fluid surrounding this tear as well.   ASSESSMENT & PLAN: See problem based charting & AVS for pt instructions. 1.) Posterior tibialis insufficiency Given chronic degenerative changes of the posterior tibialis tendon visualized on ultrasound, suspect this is  an acute on chronic exacerbation of posterior tibialis insufficiency. Discussed nitroglycerin protocol with patient - she has a history of sinus headaches, but denies history of migraine headaches. - Will start NTG protocol and will discontinue if she experiences headaches. - D/c NSAID use (due to blood pressure elevation) - Scaphoid inserts provided on green sports insoles - Encouraged aspercreme and continued use of ankle sleeve - Will follow up in 2 weeks and will bring orthotics. She will eventually need eccentric exercises for this condition.  2.) HTN Patient noted elevated blood pressure today, which is unusual for her. Has continued to take all medications as prescribed. Discussed discontinuing all NSAID use as it can elevated BP. - D/c Aleve and aspirin use - CTM at 2 week f/u

## 2015-05-16 DIAGNOSIS — Z0000 Encounter for general adult medical examination without abnormal findings: Secondary | ICD-10-CM | POA: Diagnosis not present

## 2015-05-16 DIAGNOSIS — N3941 Urge incontinence: Secondary | ICD-10-CM | POA: Diagnosis not present

## 2015-05-20 DIAGNOSIS — E049 Nontoxic goiter, unspecified: Secondary | ICD-10-CM | POA: Diagnosis not present

## 2015-05-20 DIAGNOSIS — I10 Essential (primary) hypertension: Secondary | ICD-10-CM | POA: Diagnosis not present

## 2015-05-20 DIAGNOSIS — E039 Hypothyroidism, unspecified: Secondary | ICD-10-CM | POA: Diagnosis not present

## 2015-05-20 DIAGNOSIS — R7303 Prediabetes: Secondary | ICD-10-CM | POA: Diagnosis not present

## 2015-05-27 ENCOUNTER — Encounter: Payer: Self-pay | Admitting: Sports Medicine

## 2015-05-27 ENCOUNTER — Ambulatory Visit (INDEPENDENT_AMBULATORY_CARE_PROVIDER_SITE_OTHER): Payer: Medicare Other | Admitting: Sports Medicine

## 2015-05-27 VITALS — BP 152/66 | HR 85 | Ht 65.0 in | Wt 153.0 lb

## 2015-05-27 DIAGNOSIS — R7303 Prediabetes: Secondary | ICD-10-CM | POA: Diagnosis not present

## 2015-05-27 DIAGNOSIS — M6789 Other specified disorders of synovium and tendon, multiple sites: Secondary | ICD-10-CM

## 2015-05-27 DIAGNOSIS — I10 Essential (primary) hypertension: Secondary | ICD-10-CM | POA: Diagnosis not present

## 2015-05-27 DIAGNOSIS — E032 Hypothyroidism due to medicaments and other exogenous substances: Secondary | ICD-10-CM | POA: Diagnosis not present

## 2015-05-27 DIAGNOSIS — M76829 Posterior tibial tendinitis, unspecified leg: Secondary | ICD-10-CM

## 2015-05-27 DIAGNOSIS — E789 Disorder of lipoprotein metabolism, unspecified: Secondary | ICD-10-CM | POA: Diagnosis not present

## 2015-05-27 NOTE — Progress Notes (Signed)
  Shirlee Limerick [name] Ellsworth - 75 y.o. female MRN [id number]  Date of birth: February 24, 1941  SUBJECTIVE:  Including CC & ROS.  No chief complaint on file. CC: R ankle pain  HPI: Ms. Budhram is a 75 YO woman with PMH of HTN who presents today for follow up of R posterior tibialis insufficiency. Patient was last seen on 05/13/15. Since that time, she has been consistently wearing her green inserts, ankle compression sleeve, and using the nitro patch. She has not experienced any side effects from the nitro patch. Her pain has improved slightly, but she still has pain with walking. She is able to walk her dog, but it is painful for her and she is using a cane for additional support. The pain is located along the medial aspect of her R ankle and does not radiate to the calf or forefoot. She has not noticed any swelling of the area. Pain is typically a 5/10, but can escalate to a 8/10. Resting and ice help alleviate the pain.    HISTORY: Past Medical, Surgical, Social, and Family History Reviewed & Updated per EMR.   Pertinent Historical Findings include: Recently switched from lisinopril to losartan for her HTN. Retired, but stays active and enjoys yoga.  Is a non-smoker  DATA REVIEWED: [place] R ankle from 05/13/15  PHYSICAL EXAM:  VS: BP:(!) 152/66 mmHg  HR:85bpm  TEMP: ( )  RESP:   HT:5\' 5"  (165.1 cm)   WT:153 lb (69.4 kg)  BMI:25.5 PHYSICAL EXAM: Gen: Well-appearing Caucasian female in NAD HEENT: normocephalic, atraumatic, EOMI Pulm: non-labored breathing  R ankle: No erythema, mild soft tissue swelling over the medial malleolus TTP over the medial malleolus. No TTP over achilles, calcaneous, forefoot, midfoot, or lateral malleolus. Normal ROM with plantarflexion, dorsiflexion, inversion and eversion. Pain with inversion. 5/5 strength with plantarflexion and dorsiflexion. Significant pes cavus Negative Tinel sign Neurovascularly intact.  ASSESSMENT & PLAN: See problem based charting & AVS for pt  instructions. 1.) Right posterior tibialis insufficiency - Patient gradually improving with NTG, ankle compression sleeve, and green insert with scaphoid pad.  - Custom orthotics fitted to correct for pes cavus - Continue NTG protocol - PT referral - Can continue yoga - discussed with patient to allow pain be her guide - Follow up in 1 month   Patient was fitted for a : standard, cushioned, semi-rigid orthotic. The orthotic was heated and afterward the patient stood on the orthotic blank positioned on the orthotic stand. The patient was positioned in subtalar neutral position and 10 degrees of ankle dorsiflexion in a weight bearing stance. After completion of molding, a stable base was applied to the orthotic blank. The blank was ground to a stable position for weight bearing. Size: 8 Base: Blue EVA Posting: none Additional orthotic padding: none  A total of 30 minutes was spent with the patient with greater than 50% of the time spent in face-to-face consultation discussing orthotic construction, instruction, and fitting. Patient found her orthotics to be comfortable prior to leaving the office. She will follow-up with me in 4 weeks for reevaluation of her posterior tibialis tendon tendinopathy/insufficiency.

## 2015-05-28 ENCOUNTER — Ambulatory Visit: Payer: Medicare Other | Admitting: Sports Medicine

## 2015-05-30 DIAGNOSIS — R1084 Generalized abdominal pain: Secondary | ICD-10-CM | POA: Diagnosis not present

## 2015-06-06 DIAGNOSIS — N3941 Urge incontinence: Secondary | ICD-10-CM | POA: Diagnosis not present

## 2015-06-19 DIAGNOSIS — Y9342 Activity, yoga: Secondary | ICD-10-CM

## 2015-06-19 NOTE — Congregational Nurse Program (Signed)
Congregational Nurse Program Note  Date of Encounter: 06/19/2015  Past Medical History: Past Medical History  Diagnosis Date  . Hearing loss   . Vision abnormalities   . Cancer ([place])   . Headache   . Hypertension     Encounter Details:     CNP Questionnaire - 06/19/15 2159    Patient Demographics   Patient is considered a/an Not Applicable       Attended Chair Yoga Class.

## 2015-06-20 DIAGNOSIS — M25571 Pain in right ankle and joints of right foot: Secondary | ICD-10-CM | POA: Diagnosis not present

## 2015-06-20 DIAGNOSIS — M79671 Pain in right foot: Secondary | ICD-10-CM | POA: Diagnosis not present

## 2015-06-20 DIAGNOSIS — M6789 Other specified disorders of synovium and tendon, multiple sites: Secondary | ICD-10-CM | POA: Diagnosis not present

## 2015-06-25 ENCOUNTER — Ambulatory Visit: Payer: Medicare Other | Admitting: Sports Medicine

## 2015-06-26 DIAGNOSIS — M79671 Pain in right foot: Secondary | ICD-10-CM | POA: Diagnosis not present

## 2015-06-26 DIAGNOSIS — M76821 Posterior tibial tendinitis, right leg: Secondary | ICD-10-CM | POA: Diagnosis not present

## 2015-06-26 DIAGNOSIS — M25571 Pain in right ankle and joints of right foot: Secondary | ICD-10-CM | POA: Diagnosis not present

## 2015-06-26 DIAGNOSIS — M6789 Other specified disorders of synovium and tendon, multiple sites: Secondary | ICD-10-CM | POA: Diagnosis not present

## 2015-07-01 DIAGNOSIS — M25571 Pain in right ankle and joints of right foot: Secondary | ICD-10-CM | POA: Diagnosis not present

## 2015-07-01 DIAGNOSIS — M6789 Other specified disorders of synovium and tendon, multiple sites: Secondary | ICD-10-CM | POA: Diagnosis not present

## 2015-07-01 DIAGNOSIS — M79671 Pain in right foot: Secondary | ICD-10-CM | POA: Diagnosis not present

## 2015-07-01 DIAGNOSIS — M76821 Posterior tibial tendinitis, right leg: Secondary | ICD-10-CM | POA: Diagnosis not present

## 2015-07-03 ENCOUNTER — Ambulatory Visit (INDEPENDENT_AMBULATORY_CARE_PROVIDER_SITE_OTHER): Payer: Medicare Other | Admitting: Sports Medicine

## 2015-07-03 ENCOUNTER — Encounter: Payer: Self-pay | Admitting: Sports Medicine

## 2015-07-03 VITALS — BP 160/72 | Ht 65.0 in | Wt 153.0 lb

## 2015-07-03 DIAGNOSIS — M76829 Posterior tibial tendinitis, unspecified leg: Secondary | ICD-10-CM

## 2015-07-03 DIAGNOSIS — M6789 Other specified disorders of synovium and tendon, multiple sites: Secondary | ICD-10-CM | POA: Diagnosis not present

## 2015-07-03 NOTE — Progress Notes (Signed)
   Subjective:    [name], female    DOB: May 28, 1940, 75 y.o.   MRN: [id number]  HPI   Patient comes in today for follow-up on her right foot posterior tibialis tendon insufficiency. Overall her symptoms continue to improve but her pain has not completely resolved. She notes about a 50% improvement in function and a 50% decrease in pain since starting treatment back in January. Her custom orthotics are comfortable. Physical therapy is going well with Barbaraann Barthel. He has been utilizing some Kinesiotape which has been helpful. She continues to use her nitroglycerin patches and is tolerating them without difficulty.    Review of Systems     Objective:   Physical Exam  Well-developed, well-nourished. No acute distress  Right foot: There is some slight tenderness to palpation of the posterior tibialis tendon just inferior to the medial malleolus but otherwise there is no bony or soft tissue tenderness to direct palpation. Kinesiology tape is in place. Pes planus with standing. Patient still has pain when standing on her tiptoes. Orthotics are well fitting. Neurovascularly intact distally.      Assessment & Plan:   Right foot posterior tibialis tendinopathy/insufficiency  Patient is making slow but steady progress. She will continue with physical therapy, orthotics, and topical nitroglycerin patches. Follow-up with me in 6 weeks. We'll plan on repeating her ultrasound at that visit (previous ultrasound showed a tear in the posterior tibialis tendon).

## 2015-07-04 DIAGNOSIS — N3941 Urge incontinence: Secondary | ICD-10-CM | POA: Diagnosis not present

## 2015-07-04 DIAGNOSIS — M76821 Posterior tibial tendinitis, right leg: Secondary | ICD-10-CM | POA: Diagnosis not present

## 2015-07-04 DIAGNOSIS — M6789 Other specified disorders of synovium and tendon, multiple sites: Secondary | ICD-10-CM | POA: Diagnosis not present

## 2015-07-04 DIAGNOSIS — M79671 Pain in right foot: Secondary | ICD-10-CM | POA: Diagnosis not present

## 2015-07-04 DIAGNOSIS — M25571 Pain in right ankle and joints of right foot: Secondary | ICD-10-CM | POA: Diagnosis not present

## 2015-07-04 DIAGNOSIS — N3020 Other chronic cystitis without hematuria: Secondary | ICD-10-CM | POA: Diagnosis not present

## 2015-07-04 DIAGNOSIS — Z0000 Encounter for general adult medical examination without abnormal findings: Secondary | ICD-10-CM | POA: Diagnosis not present

## 2015-07-09 ENCOUNTER — Telehealth: Payer: Self-pay | Admitting: Neurology

## 2015-07-09 ENCOUNTER — Encounter: Payer: Self-pay | Admitting: Neurology

## 2015-07-09 ENCOUNTER — Ambulatory Visit (INDEPENDENT_AMBULATORY_CARE_PROVIDER_SITE_OTHER): Payer: Medicare Other | Admitting: Neurology

## 2015-07-09 VITALS — BP 124/72 | HR 76 | Resp 14 | Ht 65.0 in | Wt 151.5 lb

## 2015-07-09 DIAGNOSIS — G4733 Obstructive sleep apnea (adult) (pediatric): Secondary | ICD-10-CM | POA: Diagnosis not present

## 2015-07-09 DIAGNOSIS — M25571 Pain in right ankle and joints of right foot: Secondary | ICD-10-CM | POA: Diagnosis not present

## 2015-07-09 DIAGNOSIS — M542 Cervicalgia: Secondary | ICD-10-CM

## 2015-07-09 DIAGNOSIS — M6789 Other specified disorders of synovium and tendon, multiple sites: Secondary | ICD-10-CM | POA: Diagnosis not present

## 2015-07-09 DIAGNOSIS — G4489 Other headache syndrome: Secondary | ICD-10-CM

## 2015-07-09 DIAGNOSIS — M79671 Pain in right foot: Secondary | ICD-10-CM | POA: Diagnosis not present

## 2015-07-09 DIAGNOSIS — M76821 Posterior tibial tendinitis, right leg: Secondary | ICD-10-CM | POA: Diagnosis not present

## 2015-07-09 MED ORDER — TIZANIDINE HCL 4 MG PO TABS: ORAL_TABLET | ORAL | Status: DC

## 2015-07-09 MED ORDER — ETODOLAC 300 MG PO CAPS: 300.0000 mg | ORAL_CAPSULE | Freq: Two times a day (BID) | ORAL | Status: DC

## 2015-07-09 NOTE — Progress Notes (Signed)
[facility]  PATIENT: Cynthia [name] DOB: Oct 06, 1940 _________________________________   HISTORICAL  CHIEF COMPLAINT:  Chief Complaint  Patient presents with  . Neck Pain    Sts. neck pain and h/a's are still much improved with Etodolac and Tizanidine.  Sts. she tolerates CPAP well but still wakes up tired.  Denies snoring and sts. if she wakes during the night it is to go to the bathroom.  Current machine is 2 or less yrs. old.  She gets supplies thru [facility]  . Headache  . Sleep Apnea    HISTORY OF PRESENT ILLNESS:  Cynthia [name] is a 75 yo woman with left neck pain and headache.    Neck pain:   HA's and neck pain are much better with etodolac and tizanidine.   This lets her sleep better at night., also.    She tolerates the medications well.   No GI upset and only minimal sleepiness early morning.     She has had no severe headaches.  No visual changes, numbness or weakness.    Shoulder pain is also much better  HA's:   Headaches are much better on current med's.     She notes doing a lot of seated computer work and sometimes gets stiff.     Dry eye:   She also reports dry eyes.  SSA/SSB were normal.  Restasis has helped her dry eyes.     No rashes but she has more itching.      OSA:   She has OSA and is on CPAP.   She continues to wear nightly and notes no changes in therapy.   She feels better when she sleeps the whole night with it.     Other:   She injured her left ankle/foot and is doing PT for a torn tendon.     Bladder:   She has another UTI.   She has trouble with hesitancy some times.  Often she feels she does not empty.    She sometimes physically pushes to try to empty better.    REVIEW OF SYSTEMS: Constitutional: No fevers, chills, sweats, or change in appetite Eyes: No visual changes, double vision, eye pain Ear, nose and throat: Chronic sinusutis.  Bilat hearing loss, worse on left  Cardiovascular: No chest pain,  palpitations Respiratory: No shortness of breath at rest or with exertion.   No wheezes.  Has OSA GastrointestinaI: No nausea, vomiting, diarrhea, abdominal pain, fecal incontinence.   Occ dysphagia Genitourinary: Frequent UTIs.  SOme frequency. Musculoskeletal: as above Integumentary: No rash, pruritus, skin lesions Neurological: as above Psychiatric: No depression at this time.  No anxiety Endocrine: No palpitations, diaphoresis, change in appetite.  Increased thirst Hematologic/Lymphatic: No anemia, purpura, petechiae. Allergic/Immunologic: No itchy/runny eyes, recent allergic reactions, rashes  ALLERGIES: No Known Allergies  HOME MEDICATIONS:  Current outpatient prescriptions:  .  calcium carbonate 1250 MG capsule, Take 1,250 mg by mouth 2 (two) times daily with a meal. Reported on 07/09/2015, Disp: , Rfl:  .  cephALEXin (KEFLEX) 250 MG capsule, , Disp: , Rfl:  .  cholecalciferol (VITAMIN D) 1000 UNITS tablet, Take 1,000 Units by mouth daily., Disp: , Rfl:  .  cycloSPORINE (RESTASIS) 0.05 % ophthalmic emulsion, Place 1 drop into both eyes 2 (two) times daily., Disp: , Rfl:  .  DULoxetine (CYMBALTA) 30 MG capsule, Take 1 capsule (30 mg total) by mouth 2 (two) times daily., Disp: 60 capsule, Rfl: 11 .  etodolac (LODINE) 300 MG  capsule, TAKE 1 CAPSULE (300 MG TOTAL) BY MOUTH 2 (TWO) TIMES DAILY., Disp: 60 capsule, Rfl: 6 .  losartan (COZAAR) 100 MG tablet, , Disp: , Rfl:  .  Meth-Hyo-M Bl-Na Phos-Ph Sal (URIBEL) 118 MG CAPS, Take 1 capsule by mouth 2 (two) times daily., Disp: , Rfl: 3 .  Multiple Vitamin (MULTIVITAMIN WITH MINERALS) TABS tablet, Take 1 tablet by mouth daily., Disp: , Rfl:  .  nitrofurantoin (MACRODANTIN) 100 MG capsule, Take 100 mg by mouth 2 (two) times daily., Disp: , Rfl: 0 .  nitroGLYCERIN (NITRODUR - DOSED IN MG/24 HR) 0.2 mg/hr patch, Use 1/4 patch daily to the affected, Disp: 30 patch, Rfl: 1 .  omega-3 acid ethyl esters (LOVAZA) 1 G capsule, Take 1 g by mouth  2 (two) times daily., Disp: , Rfl:  .  pantoprazole (PROTONIX) 40 MG tablet, Take 40 mg by mouth daily., Disp: , Rfl: 1 .  simvastatin (ZOCOR) 20 MG tablet, Take 20 mg by mouth daily., Disp: , Rfl:  .  thyroid (ARMOUR) 60 MG tablet, Take 60 mg by mouth daily before breakfast., Disp: , Rfl:  .  tiZANidine (ZANAFLEX) 4 MG tablet, Take one tablet 3 times daily as needed., Disp: 90 tablet, Rfl: 5 .  vitamin C (ASCORBIC ACID) 250 MG tablet, Take 250 mg by mouth daily., Disp: , Rfl:  .  fluconazole (DIFLUCAN) 150 MG tablet, Take 150 mg by mouth daily. Reported on 07/09/2015, Disp: , Rfl: 0 .  lisinopril (PRINIVIL,ZESTRIL) 20 MG tablet, Take 20 mg by mouth daily. Reported on 07/09/2015, Disp: , Rfl:  .  trospium (SANCTURA) 20 MG tablet, Take 20 mg by mouth 2 (two) times daily., Disp: , Rfl:   PAST MEDICAL HISTORY: Past Medical History  Diagnosis Date  . Hearing loss   . Vision abnormalities   . Cancer ([place])   . Headache   . Hypertension     PAST SURGICAL HISTORY: Past Surgical History  Procedure Laterality Date  . Partial hysterectomy    . Tonsillectomy    . Appendectomy    . Craniectomy for excision of acoustic neuroma      FAMILY HISTORY: Family History  Problem Relation Age of Onset  . Congestive Heart Failure Mother   . Heart disease Father     SOCIAL HISTORY:  Social History   Social History  . Marital Status: Married    Spouse Name: N/A  . Number of Children: N/A  . Years of Education: N/A   Occupational History  . Not on file.   Social History Main Topics  . Smoking status: Never Smoker   . Smokeless tobacco: Not on file  . Alcohol Use: No  . Drug Use: No  . Sexual Activity: Not on file   Other Topics Concern  . Not on file   Social History Narrative     PHYSICAL EXAM  Filed Vitals:   07/09/15 1445  BP: 124/72  Pulse: 76  Resp: 14  Height: 5\' 5"  (1.651 m)  Weight: 151 lb 8 oz (68.72 kg)    Body mass index is 25.21 kg/(m^2).   General: The  patient is well-developed and well-nourished and in no acute distress  Neck: The neck is supple.  Neck is non-tender with good ROM .     Skin: Extremities are without significant edema, rash.  No sclerodactyly  Musculoskeletal:  Back is non-tender.   Shoulders are nontender.  Good shoulder ROM  Neurologic Exam  Mental status: The patient is alert and  oriented x 3 at the time of the examination. The patient has normal recent and remote memory, with an apparently normal attention span and concentration ability.   Speech is normal.  Cranial nerves: Extraocular movements are full.   Facial strength is normal.  Trapezius and sternocleidomastoid strength is normal. No dysarthria is noted.    Decreased hearing on left  Motor:  Muscle bulk is normal.   Tone is normal. Strength is  5 / 5 in all 4 extremities.   Sensory: Sensory testing is intact to soft touch in all 4 extremities.  Coordination: Cerebellar testing reveals good finger-nose-finger  bilaterally.  Gait and station: Station is normal.   Gait favors right ankle.   Tandem is wide     DIAGNOSTIC DATA (LABS, IMAGING, TESTING) - I reviewed patient records, labs, notes, testing and imaging myself where available.      ASSESSMENT AND PLAN  OSA (obstructive sleep apnea)  Neck pain  Other headache syndrome   1.  Continue etodolac and tizanidine.   Renew. 2.  Remain active, exercise as tolerated 3.   rtc 12 months for scheduled visit if stable.   If neck pain or shoulder pain worsens, she will call us back, or   Amelia Macken A. Felecia Shelling, MD, PhD [id number], [id number] PM Certified in Neurology, Clinical Neurophysiology, Sleep Medicine, Pain Medicine and Neuroimaging  [facility] [address], [place], [place] [address] [phone]

## 2015-07-09 NOTE — Telephone Encounter (Signed)
Patient called to confirm appointment time for today, advised 2:20pm to arrive 2:05pm. Patient states, she won't make it by 2:05pm however will be here by 2:20pm.

## 2015-07-09 NOTE — Telephone Encounter (Signed)
noted/fim 

## 2015-07-15 DIAGNOSIS — M6789 Other specified disorders of synovium and tendon, multiple sites: Secondary | ICD-10-CM | POA: Diagnosis not present

## 2015-07-15 DIAGNOSIS — M76821 Posterior tibial tendinitis, right leg: Secondary | ICD-10-CM | POA: Diagnosis not present

## 2015-07-15 DIAGNOSIS — M79671 Pain in right foot: Secondary | ICD-10-CM | POA: Diagnosis not present

## 2015-07-15 DIAGNOSIS — J069 Acute upper respiratory infection, unspecified: Secondary | ICD-10-CM | POA: Diagnosis not present

## 2015-07-15 DIAGNOSIS — M25571 Pain in right ankle and joints of right foot: Secondary | ICD-10-CM | POA: Diagnosis not present

## 2015-07-17 DIAGNOSIS — M25571 Pain in right ankle and joints of right foot: Secondary | ICD-10-CM | POA: Diagnosis not present

## 2015-07-17 DIAGNOSIS — M76821 Posterior tibial tendinitis, right leg: Secondary | ICD-10-CM | POA: Diagnosis not present

## 2015-07-17 DIAGNOSIS — M79671 Pain in right foot: Secondary | ICD-10-CM | POA: Diagnosis not present

## 2015-07-17 DIAGNOSIS — M6789 Other specified disorders of synovium and tendon, multiple sites: Secondary | ICD-10-CM | POA: Diagnosis not present

## 2015-07-22 DIAGNOSIS — Y9342 Activity, yoga: Secondary | ICD-10-CM

## 2015-07-22 NOTE — Congregational Nurse Program (Signed)
Congregational Nurse Program Note  Date of Encounter: 07/22/2015  Past Medical History: Past Medical History  Diagnosis Date  . Hearing loss   . Vision abnormalities   . Cancer ([place])   . Headache   . Hypertension     Encounter Details:     CNP Questionnaire - 07/22/15 0952    Patient Demographics   Is this a new or existing patient? Existing   Patient is considered a/an Not Applicable   Race Caucasian/White   Patient Assistance   Location of Patient Assistance Not Applicable   Patient's financial/insurance status Medicare   Uninsured Patient No   Patient referred to apply for the following financial assistance Not Applicable   Transportation assistance No   Assistance securing medications No   Educational health offerings Exercise/physical activity   Encounter Details   Primary purpose of visit Education/Health Concerns   Was an Emergency Department visit averted? Not Applicable   Does patient have a medical provider? Yes   Patient referred to Not Applicable   Was a mental health screening completed? (GAINS tool) No   Does patient have dental issues? No   Does patient have vision issues? No   Since previous encounter, have you referred patient for abnormal blood pressure that resulted in a new diagnosis or medication change? No   Since previous encounter, have you referred patient for abnormal blood glucose that resulted in a new diagnosis or medication change? No   For Abstraction Use Only   Does patient have insurance? Yes       Attended chair yoga class.

## 2015-08-02 DIAGNOSIS — N3946 Mixed incontinence: Secondary | ICD-10-CM | POA: Diagnosis not present

## 2015-08-05 DIAGNOSIS — M6789 Other specified disorders of synovium and tendon, multiple sites: Secondary | ICD-10-CM | POA: Diagnosis not present

## 2015-08-05 DIAGNOSIS — M79671 Pain in right foot: Secondary | ICD-10-CM | POA: Diagnosis not present

## 2015-08-05 DIAGNOSIS — M25571 Pain in right ankle and joints of right foot: Secondary | ICD-10-CM | POA: Diagnosis not present

## 2015-08-05 DIAGNOSIS — M76821 Posterior tibial tendinitis, right leg: Secondary | ICD-10-CM | POA: Diagnosis not present

## 2015-08-08 DIAGNOSIS — M6789 Other specified disorders of synovium and tendon, multiple sites: Secondary | ICD-10-CM | POA: Diagnosis not present

## 2015-08-08 DIAGNOSIS — M76821 Posterior tibial tendinitis, right leg: Secondary | ICD-10-CM | POA: Diagnosis not present

## 2015-08-08 DIAGNOSIS — M79671 Pain in right foot: Secondary | ICD-10-CM | POA: Diagnosis not present

## 2015-08-08 DIAGNOSIS — M25571 Pain in right ankle and joints of right foot: Secondary | ICD-10-CM | POA: Diagnosis not present

## 2015-08-14 ENCOUNTER — Ambulatory Visit (INDEPENDENT_AMBULATORY_CARE_PROVIDER_SITE_OTHER): Payer: Medicare Other | Admitting: Sports Medicine

## 2015-08-14 ENCOUNTER — Encounter: Payer: Self-pay | Admitting: Sports Medicine

## 2015-08-14 VITALS — BP 130/80 | Ht 65.0 in | Wt 153.0 lb

## 2015-08-14 DIAGNOSIS — M25571 Pain in right ankle and joints of right foot: Secondary | ICD-10-CM

## 2015-08-14 DIAGNOSIS — M6789 Other specified disorders of synovium and tendon, multiple sites: Secondary | ICD-10-CM | POA: Diagnosis not present

## 2015-08-14 DIAGNOSIS — M76821 Posterior tibial tendinitis, right leg: Secondary | ICD-10-CM | POA: Diagnosis not present

## 2015-08-14 DIAGNOSIS — M76829 Posterior tibial tendinitis, unspecified leg: Secondary | ICD-10-CM

## 2015-08-14 DIAGNOSIS — M79671 Pain in right foot: Secondary | ICD-10-CM | POA: Diagnosis not present

## 2015-08-14 NOTE — Progress Notes (Signed)
   Subjective:    [name], female    DOB: 09-17-1940, 75 y.o.   MRN: [id number]  HPI   Patient comes in today for follow-up on right foot and ankle posterior tibialis tendinopathy/insufficiency. Unfortunately, the patient's improvement has plateaued. She was initially improving with topical nitroglycerin, physical therapy, compression, and orthotics. But unfortunately her symptoms have not continued to improve. Her pain is worse when walking barefoot but she is also getting pain when wearing good supportive tennis shoes, particularly first thing in the morning and at the end of the day. Previous ultrasound showed some partial tearing of the posterior tibialis tendon just distal to the medial malleolus.    Review of Systems     Objective:   Physical Exam  Well-developed, well-nourished. No acute distress. Vital signs reviewed  Right foot/ankle: Pes planus with standing. "Too many toes sign" is obvious. No soft tissue swelling. She is tender to palpation along the posterior tibialis tendon just inferior and distal to the medial malleolus. She has difficulty with calcaneal inversion when standing on her tiptoes. Good passive subtalar motion. Neurovascularly intact distally. Patient walks with an obvious limp.  [facility] ultrasound of the right ankle was repeated today. Once again seen is fluid around the posterior tibialis tendon just distal to the medial malleolus. There is also a persistent interstitial tear in the center of the tendon. Findings consistent with posterior tibialis tendinopathy/partial tearing.      Assessment & Plan:   Chronic right foot and ankle pain secondary to posterior tibialis tendon insufficiency/tendinopathy  Despite conservative treatment, the patient continues to struggle with pain. Therefore, I would like to refer the patient to Dr. Doran Durand (or his PA, Mechele Claude). Patient understands that she will have pain if she walks barefoot no matter what  treatment she undergoes but I would like to see if there is something else that Dr. Doran Durand can offer her other than surgery. He may be more successful with a different type of brace or physical therapy. I would defer further workup and treatment of this chronic condition to the discretion of Dr. Doran Durand and the patient will follow-up with me as needed.

## 2015-08-28 DIAGNOSIS — J320 Chronic maxillary sinusitis: Secondary | ICD-10-CM | POA: Diagnosis not present

## 2015-08-28 DIAGNOSIS — J323 Chronic sphenoidal sinusitis: Secondary | ICD-10-CM | POA: Diagnosis not present

## 2015-08-30 DIAGNOSIS — R338 Other retention of urine: Secondary | ICD-10-CM | POA: Diagnosis not present

## 2015-08-30 DIAGNOSIS — N3941 Urge incontinence: Secondary | ICD-10-CM | POA: Diagnosis not present

## 2015-08-30 DIAGNOSIS — R3989 Other symptoms and signs involving the genitourinary system: Secondary | ICD-10-CM | POA: Diagnosis not present

## 2015-08-30 DIAGNOSIS — Z0000 Encounter for general adult medical examination without abnormal findings: Secondary | ICD-10-CM | POA: Diagnosis not present

## 2015-08-30 DIAGNOSIS — R3915 Urgency of urination: Secondary | ICD-10-CM | POA: Diagnosis not present

## 2015-09-03 DIAGNOSIS — N3941 Urge incontinence: Secondary | ICD-10-CM | POA: Diagnosis not present

## 2015-09-04 DIAGNOSIS — M76821 Posterior tibial tendinitis, right leg: Secondary | ICD-10-CM | POA: Diagnosis not present

## 2015-09-05 DIAGNOSIS — J309 Allergic rhinitis, unspecified: Secondary | ICD-10-CM | POA: Diagnosis not present

## 2015-09-05 DIAGNOSIS — R350 Frequency of micturition: Secondary | ICD-10-CM | POA: Diagnosis not present

## 2015-09-24 DIAGNOSIS — Y9342 Activity, yoga: Secondary | ICD-10-CM

## 2015-09-24 NOTE — Congregational Nurse Program (Signed)
Congregational Nurse Program Note  Date of Encounter: 09/24/2015  Past Medical History: Past Medical History  Diagnosis Date  . Hearing loss   . Vision abnormalities   . Cancer ([place])   . Headache   . Hypertension     Encounter Details:     CNP Questionnaire - 09/24/15 1431    Patient Demographics   Is this a new or existing patient? Existing   Patient is considered a/an Not Applicable   Race Caucasian/White   Patient Assistance   Location of Patient Assistance Not Applicable   Patient's financial/insurance status Medicare   Uninsured Patient No   Patient referred to apply for the following financial assistance Not Applicable   Food insecurities addressed Not Applicable   Transportation assistance No   Assistance securing medications No   Educational health offerings Exercise/physical activity   Encounter Details   Primary purpose of visit Education/Health Concerns   Was an Emergency Department visit averted? Not Applicable   Does patient have a medical provider? Yes   Patient referred to Not Applicable   Was a mental health screening completed? (GAINS tool) No   Does patient have dental issues? No   Does patient have vision issues? No   Does your patient have an abnormal blood pressure today? No   Since previous encounter, have you referred patient for abnormal blood pressure that resulted in a new diagnosis or medication change? No   Does your patient have an abnormal blood glucose today? No   Since previous encounter, have you referred patient for abnormal blood glucose that resulted in a new diagnosis or medication change? No   Was there a life-saving intervention made? No       Attended chair yoga class.

## 2015-10-08 DIAGNOSIS — N3941 Urge incontinence: Secondary | ICD-10-CM | POA: Diagnosis not present

## 2015-10-08 DIAGNOSIS — R3915 Urgency of urination: Secondary | ICD-10-CM | POA: Diagnosis not present

## 2015-10-10 DIAGNOSIS — Z85820 Personal history of malignant melanoma of skin: Secondary | ICD-10-CM | POA: Diagnosis not present

## 2015-10-10 DIAGNOSIS — L820 Inflamed seborrheic keratosis: Secondary | ICD-10-CM | POA: Diagnosis not present

## 2015-10-10 DIAGNOSIS — L821 Other seborrheic keratosis: Secondary | ICD-10-CM | POA: Diagnosis not present

## 2015-10-10 DIAGNOSIS — L814 Other melanin hyperpigmentation: Secondary | ICD-10-CM | POA: Diagnosis not present

## 2015-10-10 DIAGNOSIS — D1801 Hemangioma of skin and subcutaneous tissue: Secondary | ICD-10-CM | POA: Diagnosis not present

## 2015-10-10 DIAGNOSIS — D235 Other benign neoplasm of skin of trunk: Secondary | ICD-10-CM | POA: Diagnosis not present

## 2015-10-11 DIAGNOSIS — H2513 Age-related nuclear cataract, bilateral: Secondary | ICD-10-CM | POA: Diagnosis not present

## 2015-10-17 DIAGNOSIS — Y9342 Activity, yoga: Secondary | ICD-10-CM

## 2015-10-17 NOTE — Congregational Nurse Program (Signed)
Congregational Nurse Program Note  Date of Encounter: 10/17/2015  Past Medical History: Past Medical History  Diagnosis Date  . Hearing loss   . Vision abnormalities   . Cancer ([place])   . Headache   . Hypertension     Encounter Details:     CNP Questionnaire - 10/17/15 1157    Patient Demographics   Is this a new or existing patient? Existing   Patient is considered a/an Not Applicable   Race Caucasian/White   Patient Assistance   Location of Patient Assistance Not Applicable   Patient's financial/insurance status Medicare   Uninsured Patient No   Patient referred to apply for the following financial assistance Not Applicable   Food insecurities addressed Not Applicable   Transportation assistance No   Assistance securing medications No   Educational health offerings Exercise/physical activity   Encounter Details   Primary purpose of visit Education/Health Concerns   Was an Emergency Department visit averted? Not Applicable   Does patient have a medical provider? Yes   Patient referred to Not Applicable   Was a mental health screening completed? (GAINS tool) No   Does patient have dental issues? No   Does patient have vision issues? No   Does your patient have an abnormal blood pressure today? No   Since previous encounter, have you referred patient for abnormal blood pressure that resulted in a new diagnosis or medication change? No   Does your patient have an abnormal blood glucose today? No   Since previous encounter, have you referred patient for abnormal blood glucose that resulted in a new diagnosis or medication change? No   Was there a life-saving intervention made? No       Attended chair yoga class.

## 2015-10-29 DIAGNOSIS — N3941 Urge incontinence: Secondary | ICD-10-CM | POA: Diagnosis not present

## 2015-10-29 DIAGNOSIS — R3915 Urgency of urination: Secondary | ICD-10-CM | POA: Diagnosis not present

## 2015-11-11 DIAGNOSIS — M76821 Posterior tibial tendinitis, right leg: Secondary | ICD-10-CM | POA: Diagnosis not present

## 2015-11-18 DIAGNOSIS — I10 Essential (primary) hypertension: Secondary | ICD-10-CM | POA: Diagnosis not present

## 2015-11-18 DIAGNOSIS — N390 Urinary tract infection, site not specified: Secondary | ICD-10-CM | POA: Diagnosis not present

## 2015-11-18 DIAGNOSIS — E049 Nontoxic goiter, unspecified: Secondary | ICD-10-CM | POA: Diagnosis not present

## 2015-11-18 DIAGNOSIS — R7303 Prediabetes: Secondary | ICD-10-CM | POA: Diagnosis not present

## 2015-11-18 DIAGNOSIS — E039 Hypothyroidism, unspecified: Secondary | ICD-10-CM | POA: Diagnosis not present

## 2015-11-18 DIAGNOSIS — E7800 Pure hypercholesterolemia, unspecified: Secondary | ICD-10-CM | POA: Diagnosis not present

## 2015-11-26 DIAGNOSIS — N3941 Urge incontinence: Secondary | ICD-10-CM | POA: Diagnosis not present

## 2015-11-27 DIAGNOSIS — E789 Disorder of lipoprotein metabolism, unspecified: Secondary | ICD-10-CM | POA: Diagnosis not present

## 2015-11-27 DIAGNOSIS — E032 Hypothyroidism due to medicaments and other exogenous substances: Secondary | ICD-10-CM | POA: Diagnosis not present

## 2015-11-27 DIAGNOSIS — I10 Essential (primary) hypertension: Secondary | ICD-10-CM | POA: Diagnosis not present

## 2015-11-28 DIAGNOSIS — Y9342 Activity, yoga: Secondary | ICD-10-CM

## 2015-11-28 NOTE — Congregational Nurse Program (Signed)
Congregational Nurse Program Note  Date of Encounter: 11/28/2015  Past Medical History: Past Medical History:  Diagnosis Date  . Cancer ([place])   . Headache   . Hearing loss   . Hypertension   . Vision abnormalities     Encounter Details:     CNP Questionnaire - 11/28/15 1114      Patient Demographics   Is this a new or existing patient? Existing   Patient is considered a/an Not Applicable   Race Caucasian/White     Patient Assistance   Location of Patient Assistance Not Applicable   Patient's financial/insurance status Medicare   Uninsured Patient No   Patient referred to apply for the following financial assistance Not Applicable   Food insecurities addressed Not Applicable   Transportation assistance No   Assistance securing medications No   Educational health offerings Exercise/physical activity     Encounter Details   Primary purpose of visit Education/Health Concerns   Was an Emergency Department visit averted? Not Applicable   Does patient have a medical provider? Yes   Patient referred to Not Applicable   Was a mental health screening completed? (GAINS tool) No   Does patient have dental issues? No   Does patient have vision issues? No   Does your patient have an abnormal blood pressure today? No   Since previous encounter, have you referred patient for abnormal blood pressure that resulted in a new diagnosis or medication change? No   Does your patient have an abnormal blood glucose today? No   Since previous encounter, have you referred patient for abnormal blood glucose that resulted in a new diagnosis or medication change? No   Was there a life-saving intervention made? No      Attended chair yoga class.

## 2015-12-21 DIAGNOSIS — Z23 Encounter for immunization: Secondary | ICD-10-CM | POA: Diagnosis not present

## 2015-12-24 DIAGNOSIS — N3941 Urge incontinence: Secondary | ICD-10-CM | POA: Diagnosis not present

## 2016-01-03 ENCOUNTER — Other Ambulatory Visit: Payer: Self-pay

## 2016-01-03 ENCOUNTER — Encounter: Payer: Self-pay | Admitting: Sports Medicine

## 2016-01-03 ENCOUNTER — Ambulatory Visit (INDEPENDENT_AMBULATORY_CARE_PROVIDER_SITE_OTHER): Payer: Medicare Other | Admitting: Sports Medicine

## 2016-01-03 VITALS — BP 144/61 | Ht 65.0 in | Wt 152.0 lb

## 2016-01-03 DIAGNOSIS — M1990 Unspecified osteoarthritis, unspecified site: Secondary | ICD-10-CM | POA: Insufficient documentation

## 2016-01-03 DIAGNOSIS — M25562 Pain in left knee: Secondary | ICD-10-CM

## 2016-01-03 DIAGNOSIS — M179 Osteoarthritis of knee, unspecified: Secondary | ICD-10-CM | POA: Diagnosis not present

## 2016-01-03 DIAGNOSIS — M1712 Unilateral primary osteoarthritis, left knee: Secondary | ICD-10-CM

## 2016-01-03 MED ORDER — METHYLPREDNISOLONE ACETATE 40 MG/ML IJ SUSP
40.0000 mg | Freq: Once | INTRAMUSCULAR | Status: AC
  Administered 2016-01-03: 40 mg via INTRA_ARTICULAR

## 2016-01-03 NOTE — Assessment & Plan Note (Signed)
Left knee pain, may be secondary to arthritis which is consistent with her ultrasound. Left knee was injected without issue. She'll follow-up as needed for this.

## 2016-01-03 NOTE — Progress Notes (Signed)
  Cynthia [name] [name] Heitz - 75 y.o. female MRN [id number]  Date of birth: December 07, 1940  SUBJECTIVE:  Including CC & ROS.  [id number] knee pain Cynthia [name] complains of left knee pain that has been ongoing for the past couple weeks. She reports that it is on the posterior lateral aspect of her knee and some on the lateral aspect of her knee. She reports that it did swell at one point, but it is no longer swollen. She cannot recall a specific injury.  She believe she may have had surgery on this knee and it might have been a meniscus tear. She has not have any knee replacements on either side. She was seen for right knee previously and has mild degenerative changes on that side. She does complain of tingling distally that is along her feet and she has known foot issues. She has no diabetes.   ROS: No unexpected weight loss, fever, chills, swelling, instability, muscle pain, numbness/tingling, redness, otherwise see HPI   PMHx - Updated and reviewed.  Contributory factors include: Negative PSHx - Updated and reviewed.  Contributory factors include:  Negative FHx - Updated and reviewed.  Contributory factors include:  Negative Social Hx - Updated and reviewed. Contributory factors include: Negative Medications - reviewed   DATA REVIEWED: Previous x-rays and MRI of her right knee  PHYSICAL EXAM:  VS: BP:(!) 144/61  HR: bpm  TEMP: ( )  RESP:   HT:5\' 5"  (165.1 cm)   WT:152 lb (68.9 kg)  BMI:25.3 PHYSICAL EXAM: Gen: NAD, alert, cooperative with exam, well-appearing HEENT: clear conjunctiva,  CV:  no edema, capillary refill brisk, normal rate Resp: non-labored Skin: no rashes, normal turgor  Neuro: no gross deficits.  Psych:  alert and oriented Knee: Normal to inspection with no erythema or effusion or obvious bony abnormalities. Palpation with no warmth, patellar tenderness, or condyle tenderness, but did have lateral joint line tenderness.  Discomfort in posterolateral aspect of knee along distal hamstring  tendon ROM full in flexion and extension and lower leg rotation. Ligaments with solid consistent endpoints including ACL, PCL, LCL, MCL. Negative Mcmurray's, Apley's, and Thessalonian tests. Non painful patellar compression. Patellar glide with crepitus. Patellar and quadriceps tendons unremarkable.  Ultrasound: Ultrasound of her left knee was performed, limited. Suprapatellar pouch was with 1+ knee effusion. Quadriceps tendon appeared intact with no hypoechoic hyperechoic changes. Lateral meniscus was intact but did have bone spurring along the joint line. Medial meniscus appeared intact but did have some spurring along the joint line as well. This is likely consistent with arthritis.  ASSESSMENT & PLAN:   Left knee pain Left knee pain, may be secondary to arthritis which is consistent with her ultrasound. Left knee was injected without issue. She'll follow-up as needed for this.  Consent obtained and verified. Sterile betadine prep. Furthur cleansed with alcohol. Topical analgesic spray: Ethyl chloride. Joint: left knee Approached in typical fashion with:  Completed without difficulty Meds: 40 mg depomedrol, 3 cc 1% xylocaine Needle: 22 gauge Aftercare instructions and Red flags advised.

## 2016-01-06 ENCOUNTER — Other Ambulatory Visit: Payer: Self-pay | Admitting: Internal Medicine

## 2016-01-06 DIAGNOSIS — Z1231 Encounter for screening mammogram for malignant neoplasm of breast: Secondary | ICD-10-CM

## 2016-01-09 DIAGNOSIS — Y9342 Activity, yoga: Secondary | ICD-10-CM

## 2016-01-09 NOTE — Congregational Nurse Program (Signed)
Attended chair yoga class.Congregational Nurse Program Note  Date of Encounter: 01/09/2016  Past Medical History: Past Medical History:  Diagnosis Date  . Cancer ([place])   . Headache   . Hearing loss   . Hypertension   . Vision abnormalities     Encounter Details:     CNP Questionnaire - 01/09/16 1834      Patient Demographics   Is this a new or existing patient? Existing   Patient is considered a/an Not Applicable   Race Caucasian/White     Patient Assistance   Location of Patient Assistance Not Applicable   Patient's financial/insurance status Medicare   Uninsured Patient No   Patient referred to apply for the following financial assistance Not Applicable   Food insecurities addressed Not Applicable   Transportation assistance No   Assistance securing medications No   Educational health offerings Exercise/physical activity     Encounter Details   Primary purpose of visit Education/Health Concerns   Was an Emergency Department visit averted? Not Applicable   Does patient have a medical provider? Yes   Patient referred to Not Applicable   Was a mental health screening completed? (GAINS tool) No   Does patient have dental issues? No   Does patient have vision issues? No   Does your patient have an abnormal blood pressure today? No   Since previous encounter, have you referred patient for abnormal blood pressure that resulted in a new diagnosis or medication change? No   Does your patient have an abnormal blood glucose today? No   Since previous encounter, have you referred patient for abnormal blood glucose that resulted in a new diagnosis or medication change? No   Was there a life-saving intervention made? No

## 2016-01-16 ENCOUNTER — Ambulatory Visit: Payer: Medicare Other

## 2016-01-18 ENCOUNTER — Other Ambulatory Visit: Payer: Self-pay | Admitting: Neurology

## 2016-01-20 ENCOUNTER — Ambulatory Visit: Payer: Medicare Other

## 2016-01-21 DIAGNOSIS — Y9342 Activity, yoga: Secondary | ICD-10-CM

## 2016-01-21 DIAGNOSIS — R3915 Urgency of urination: Secondary | ICD-10-CM | POA: Diagnosis not present

## 2016-01-21 NOTE — Congregational Nurse Program (Signed)
Congregational Nurse Program Note  Date of Encounter: 01/21/2016  Past Medical History: Past Medical History:  Diagnosis Date  . Cancer ([place])   . Headache   . Hearing loss   . Hypertension   . Vision abnormalities     Encounter Details:     CNP Questionnaire - 01/21/16 1116      Patient Demographics   Is this a new or existing patient? Existing   Patient is considered a/an Not Applicable   Race Caucasian/White     Patient Assistance   Location of Patient Assistance Not Applicable   Patient's financial/insurance status Medicare   Uninsured Patient No   Patient referred to apply for the following financial assistance Not Applicable   Food insecurities addressed Not Applicable   Transportation assistance No   Assistance securing medications No   Educational health offerings Exercise/physical activity     Encounter Details   Primary purpose of visit Education/Health Concerns   Was an Emergency Department visit averted? Not Applicable   Does patient have a medical provider? Yes   Patient referred to Not Applicable   Was a mental health screening completed? (GAINS tool) No   Does patient have dental issues? No   Does patient have vision issues? No   Does your patient have an abnormal blood pressure today? No   Since previous encounter, have you referred patient for abnormal blood pressure that resulted in a new diagnosis or medication change? No   Does your patient have an abnormal blood glucose today? No   Since previous encounter, have you referred patient for abnormal blood glucose that resulted in a new diagnosis or medication change? No   Was there a life-saving intervention made? No     Attended chair yoga class.

## 2016-01-22 DIAGNOSIS — I10 Essential (primary) hypertension: Secondary | ICD-10-CM | POA: Diagnosis not present

## 2016-01-22 DIAGNOSIS — R7303 Prediabetes: Secondary | ICD-10-CM | POA: Diagnosis not present

## 2016-01-22 DIAGNOSIS — E559 Vitamin D deficiency, unspecified: Secondary | ICD-10-CM | POA: Diagnosis not present

## 2016-01-22 DIAGNOSIS — M810 Age-related osteoporosis without current pathological fracture: Secondary | ICD-10-CM | POA: Diagnosis not present

## 2016-01-22 DIAGNOSIS — Z0000 Encounter for general adult medical examination without abnormal findings: Secondary | ICD-10-CM | POA: Diagnosis not present

## 2016-01-22 DIAGNOSIS — M85859 Other specified disorders of bone density and structure, unspecified thigh: Secondary | ICD-10-CM | POA: Diagnosis not present

## 2016-01-29 DIAGNOSIS — Z8739 Personal history of other diseases of the musculoskeletal system and connective tissue: Secondary | ICD-10-CM | POA: Diagnosis not present

## 2016-01-29 DIAGNOSIS — H6121 Impacted cerumen, right ear: Secondary | ICD-10-CM | POA: Diagnosis not present

## 2016-01-29 DIAGNOSIS — Z1212 Encounter for screening for malignant neoplasm of rectum: Secondary | ICD-10-CM | POA: Diagnosis not present

## 2016-01-29 DIAGNOSIS — E049 Nontoxic goiter, unspecified: Secondary | ICD-10-CM | POA: Diagnosis not present

## 2016-01-29 DIAGNOSIS — G4733 Obstructive sleep apnea (adult) (pediatric): Secondary | ICD-10-CM | POA: Diagnosis not present

## 2016-01-29 DIAGNOSIS — J309 Allergic rhinitis, unspecified: Secondary | ICD-10-CM | POA: Diagnosis not present

## 2016-01-31 ENCOUNTER — Ambulatory Visit: Payer: Medicare Other

## 2016-02-04 DIAGNOSIS — Z1212 Encounter for screening for malignant neoplasm of rectum: Secondary | ICD-10-CM | POA: Diagnosis not present

## 2016-02-13 DIAGNOSIS — R3915 Urgency of urination: Secondary | ICD-10-CM | POA: Diagnosis not present

## 2016-02-13 DIAGNOSIS — N3941 Urge incontinence: Secondary | ICD-10-CM | POA: Diagnosis not present

## 2016-02-18 ENCOUNTER — Ambulatory Visit
Admission: RE | Admit: 2016-02-18 | Discharge: 2016-02-18 | Disposition: A | Discharge status: Home / Self Care | Payer: Medicare Other | Source: Ambulatory Visit | Attending: Internal Medicine | Admitting: Internal Medicine

## 2016-02-18 DIAGNOSIS — Z1231 Encounter for screening mammogram for malignant neoplasm of breast: Secondary | ICD-10-CM | POA: Diagnosis not present

## 2016-02-27 ENCOUNTER — Telehealth: Payer: Self-pay | Admitting: Neurology

## 2016-02-27 NOTE — Telephone Encounter (Signed)
If pt. needs an orthotic, will refer her to a local dme co/[facility]

## 2016-02-27 NOTE — Telephone Encounter (Signed)
[facility] [phone] called to check status of request for orthotic brace that was faxed 7-10 business days ago. Please reference ID# [id number] when calling.

## 2016-02-29 NOTE — Congregational Nurse Program (Signed)
Congregational Nurse Program Note  Date of Encounter: 02/29/2016  Past Medical History: Past Medical History:  Diagnosis Date  . Cancer ([place])   . Headache   . Hearing loss   . Hypertension   . Vision abnormalities     Encounter Details:     CNP Questionnaire - 02/29/16 1310      Patient Demographics   Is this a new or existing patient? Existing   Patient is considered a/an Not Applicable   Race Caucasian/White     Patient Assistance   Location of Patient Assistance Not Applicable   Patient's financial/insurance status Medicare   Uninsured Patient ([place] [employer] Connects) No   Patient referred to apply for the following financial assistance Not Applicable   Food insecurities addressed Not Applicable   Transportation assistance No   Assistance securing medications No   Educational health offerings Exercise/physical activity     Encounter Details   Primary purpose of visit Education/Health Concerns   Was an Emergency Department visit averted? Not Applicable   Does patient have a medical provider? Yes   Patient referred to Not Applicable   Was a mental health screening completed? (GAINS tool) No   Does patient have dental issues? No   Does patient have vision issues? No   Does your patient have an abnormal blood pressure today? No   Since previous encounter, have you referred patient for abnormal blood pressure that resulted in a new diagnosis or medication change? No   Does your patient have an abnormal blood glucose today? No   Since previous encounter, have you referred patient for abnormal blood glucose that resulted in a new diagnosis or medication change? No   Was there a life-saving intervention made? No    Attended chair yoga class.

## 2016-03-02 NOTE — Telephone Encounter (Signed)
Noted/fim 

## 2016-03-02 NOTE — Telephone Encounter (Signed)
Cynthia [name] at [facility] of previous message.

## 2016-03-03 DIAGNOSIS — N3941 Urge incontinence: Secondary | ICD-10-CM | POA: Diagnosis not present

## 2016-03-04 DIAGNOSIS — J323 Chronic sphenoidal sinusitis: Secondary | ICD-10-CM | POA: Diagnosis not present

## 2016-03-04 DIAGNOSIS — J320 Chronic maxillary sinusitis: Secondary | ICD-10-CM | POA: Diagnosis not present

## 2016-03-16 DIAGNOSIS — M810 Age-related osteoporosis without current pathological fracture: Secondary | ICD-10-CM | POA: Diagnosis not present

## 2016-03-16 DIAGNOSIS — I10 Essential (primary) hypertension: Secondary | ICD-10-CM | POA: Diagnosis not present

## 2016-03-18 NOTE — Congregational Nurse Program (Signed)
Congregational Nurse Program Note  Date of Encounter: 03/18/2016  Past Medical History: Past Medical History:  Diagnosis Date  . Cancer ([place])   . Headache   . Hearing loss   . Hypertension   . Vision abnormalities     Encounter Details:     CNP Questionnaire - 03/18/16 1507      Patient Demographics   Is this a new or existing patient? Existing   Patient is considered a/an Not Applicable   Race Caucasian/White     Patient Assistance   Location of Patient Assistance Not Applicable   Patient's financial/insurance status Medicare   Uninsured Patient ([place] [employer]/Care Connects) No   Patient referred to apply for the following financial assistance Not Applicable   Food insecurities addressed Not Applicable   Transportation assistance No   Assistance securing medications No   Educational health offerings Exercise/physical activity     Encounter Details   Primary purpose of visit Education/Health Concerns   Was an Emergency Department visit averted? Not Applicable   Does patient have a medical provider? Yes   Patient referred to Not Applicable   Was a mental health screening completed? (GAINS tool) No   Does patient have dental issues? No   Does patient have vision issues? No   Does your patient have an abnormal blood pressure today? No   Since previous encounter, have you referred patient for abnormal blood pressure that resulted in a new diagnosis or medication change? No   Does your patient have an abnormal blood glucose today? No   Since previous encounter, have you referred patient for abnormal blood glucose that resulted in a new diagnosis or medication change? No   Was there a life-saving intervention made? No    Attended chair yoga class.

## 2016-03-25 DIAGNOSIS — I10 Essential (primary) hypertension: Secondary | ICD-10-CM | POA: Diagnosis not present

## 2016-03-25 DIAGNOSIS — R3915 Urgency of urination: Secondary | ICD-10-CM | POA: Diagnosis not present

## 2016-03-26 DIAGNOSIS — N390 Urinary tract infection, site not specified: Secondary | ICD-10-CM | POA: Diagnosis not present

## 2016-03-26 DIAGNOSIS — N3946 Mixed incontinence: Secondary | ICD-10-CM | POA: Diagnosis not present

## 2016-03-31 DIAGNOSIS — I10 Essential (primary) hypertension: Secondary | ICD-10-CM | POA: Diagnosis not present

## 2016-04-02 DIAGNOSIS — L84 Corns and callosities: Secondary | ICD-10-CM | POA: Diagnosis not present

## 2016-04-02 DIAGNOSIS — M76822 Posterior tibial tendinitis, left leg: Secondary | ICD-10-CM | POA: Diagnosis not present

## 2016-04-02 DIAGNOSIS — G609 Hereditary and idiopathic neuropathy, unspecified: Secondary | ICD-10-CM | POA: Diagnosis not present

## 2016-04-11 ENCOUNTER — Other Ambulatory Visit: Payer: Self-pay | Admitting: Neurology

## 2016-04-20 DIAGNOSIS — J329 Chronic sinusitis, unspecified: Secondary | ICD-10-CM | POA: Diagnosis not present

## 2016-04-20 DIAGNOSIS — R0982 Postnasal drip: Secondary | ICD-10-CM | POA: Diagnosis not present

## 2016-05-05 DIAGNOSIS — N3941 Urge incontinence: Secondary | ICD-10-CM | POA: Diagnosis not present

## 2016-05-11 ENCOUNTER — Other Ambulatory Visit: Payer: Self-pay | Admitting: Neurology

## 2016-05-22 DIAGNOSIS — E039 Hypothyroidism, unspecified: Secondary | ICD-10-CM | POA: Diagnosis not present

## 2016-05-26 DIAGNOSIS — N3941 Urge incontinence: Secondary | ICD-10-CM | POA: Diagnosis not present

## 2016-05-28 DIAGNOSIS — N3281 Overactive bladder: Secondary | ICD-10-CM | POA: Diagnosis not present

## 2016-05-28 DIAGNOSIS — I10 Essential (primary) hypertension: Secondary | ICD-10-CM | POA: Diagnosis not present

## 2016-05-28 DIAGNOSIS — E049 Nontoxic goiter, unspecified: Secondary | ICD-10-CM | POA: Diagnosis not present

## 2016-06-08 DIAGNOSIS — N3946 Mixed incontinence: Secondary | ICD-10-CM | POA: Diagnosis not present

## 2016-06-08 DIAGNOSIS — N3000 Acute cystitis without hematuria: Secondary | ICD-10-CM | POA: Diagnosis not present

## 2016-06-11 NOTE — Congregational Nurse Program (Signed)
Congregational Nurse Program Note  Date of Encounter: 06/11/2016  Past Medical History: Past Medical History:  Diagnosis Date  . Cancer ([place])   . Headache   . Hearing loss   . Hypertension   . Vision abnormalities     Encounter Details:     CNP Questionnaire - 06/11/16 1131      Patient Demographics   Is this a new or existing patient? Existing   Patient is considered a/an Not Applicable   Race Caucasian/White     Patient Assistance   Location of Patient Assistance Not Applicable   Patient's financial/insurance status Medicare   Uninsured Patient ([place] [employer]/Care Connects) No   Patient referred to apply for the following financial assistance Not Applicable   Food insecurities addressed Not Applicable   Transportation assistance No   Assistance securing medications No   Educational health offerings Exercise/physical activity     Encounter Details   Primary purpose of visit Education/Health Concerns   Was an Emergency Department visit averted? Not Applicable   Does patient have a medical provider? Yes   Patient referred to Not Applicable   Was a mental health screening completed? (GAINS tool) No   Does patient have dental issues? No   Does patient have vision issues? No   Does your patient have an abnormal blood pressure today? No   Since previous encounter, have you referred patient for abnormal blood pressure that resulted in a new diagnosis or medication change? No   Does your patient have an abnormal blood glucose today? No   Since previous encounter, have you referred patient for abnormal blood glucose that resulted in a new diagnosis or medication change? No   Was there a life-saving intervention made? No    Attended chair yoga class.

## 2016-06-25 DIAGNOSIS — M76822 Posterior tibial tendinitis, left leg: Secondary | ICD-10-CM | POA: Diagnosis not present

## 2016-06-25 DIAGNOSIS — G608 Other hereditary and idiopathic neuropathies: Secondary | ICD-10-CM | POA: Diagnosis not present

## 2016-06-25 DIAGNOSIS — M76821 Posterior tibial tendinitis, right leg: Secondary | ICD-10-CM | POA: Diagnosis not present

## 2016-07-02 DIAGNOSIS — N3941 Urge incontinence: Secondary | ICD-10-CM | POA: Diagnosis not present

## 2016-07-02 DIAGNOSIS — N3020 Other chronic cystitis without hematuria: Secondary | ICD-10-CM | POA: Diagnosis not present

## 2016-07-02 DIAGNOSIS — R3915 Urgency of urination: Secondary | ICD-10-CM | POA: Diagnosis not present

## 2016-07-06 NOTE — Congregational Nurse Program (Signed)
Congregational Nurse Program Note  Date of Encounter: 07/06/2016  Past Medical History: Past Medical History:  Diagnosis Date  . Cancer ([place])   . Headache   . Hearing loss   . Hypertension   . Vision abnormalities     Encounter Details:     CNP Questionnaire - 07/06/16 1754      Patient Demographics   Is this a new or existing patient? Existing   Patient is considered a/an Not Applicable   Race Caucasian/White     Patient Assistance   Location of Patient Assistance Not Applicable   Patient's financial/insurance status Medicare   Uninsured Patient ([place] [employer] Connects) No   Patient referred to apply for the following financial assistance Not Applicable   Food insecurities addressed Not Applicable   Transportation assistance No   Assistance securing medications No   Educational health offerings Exercise/physical activity     Encounter Details   Primary purpose of visit Education/Health Concerns   Was an Emergency Department visit averted? Not Applicable   Does patient have a medical provider? Yes   Patient referred to Not Applicable   Was a mental health screening completed? (GAINS tool) No   Does patient have dental issues? No   Does patient have vision issues? No   Does your patient have an abnormal blood pressure today? No   Since previous encounter, have you referred patient for abnormal blood pressure that resulted in a new diagnosis or medication change? No   Does your patient have an abnormal blood glucose today? No   Since previous encounter, have you referred patient for abnormal blood glucose that resulted in a new diagnosis or medication change? No   Was there a life-saving intervention made? No    attended chair yoga class

## 2016-07-09 ENCOUNTER — Ambulatory Visit: Payer: Medicare Other | Admitting: Neurology

## 2016-07-13 ENCOUNTER — Encounter: Payer: Self-pay | Admitting: Neurology

## 2016-07-24 ENCOUNTER — Telehealth: Payer: Self-pay | Admitting: Neurology

## 2016-07-24 NOTE — Telephone Encounter (Signed)
Pt. not seen in over one yr.  I have spoken with her, made f/u appt. in May.  Unable to fill rx's until seen.  She verbalized understanding of same/fim

## 2016-07-24 NOTE — Telephone Encounter (Signed)
Pt is in [place] and is needing a prior approval of her etodolac (LODINE) 300 MG capsule she said she is down to 5 pills . Pt is on her way to [facility].  She would like it sent to a [facility] in [place], [place] [phone] store# [phone]

## 2016-08-06 ENCOUNTER — Other Ambulatory Visit: Payer: Self-pay | Admitting: Neurology

## 2016-08-20 DIAGNOSIS — N3020 Other chronic cystitis without hematuria: Secondary | ICD-10-CM | POA: Diagnosis not present

## 2016-08-25 ENCOUNTER — Ambulatory Visit: Payer: Self-pay | Admitting: Neurology

## 2016-08-26 ENCOUNTER — Encounter: Payer: Self-pay | Admitting: Neurology

## 2016-08-31 ENCOUNTER — Telehealth: Payer: Self-pay | Admitting: Neurology

## 2016-08-31 NOTE — Telephone Encounter (Signed)
Patient called and requested to reschedule her apt, she has no showed her last two apt's. Is it ok to schedule this patient?

## 2016-09-03 ENCOUNTER — Other Ambulatory Visit: Payer: Self-pay | Admitting: Neurology

## 2016-09-09 DIAGNOSIS — N3020 Other chronic cystitis without hematuria: Secondary | ICD-10-CM | POA: Diagnosis not present

## 2016-09-09 DIAGNOSIS — R3914 Feeling of incomplete bladder emptying: Secondary | ICD-10-CM | POA: Diagnosis not present

## 2016-09-09 DIAGNOSIS — R350 Frequency of micturition: Secondary | ICD-10-CM | POA: Diagnosis not present

## 2016-09-09 DIAGNOSIS — N1330 Unspecified hydronephrosis: Secondary | ICD-10-CM | POA: Diagnosis not present

## 2016-09-09 DIAGNOSIS — N8110 Cystocele, unspecified: Secondary | ICD-10-CM | POA: Diagnosis not present

## 2016-09-15 NOTE — Congregational Nurse Program (Signed)
Congregational Nurse Program Note  Date of Encounter: 09/15/2016  Past Medical History: Past Medical History:  Diagnosis Date  . Cancer ([place])   . Headache   . Hearing loss   . Hypertension   . Vision abnormalities     Encounter Details:     CNP Questionnaire - 09/15/16 0207      Patient Demographics   Is this a new or existing patient? Existing   Patient is considered a/an Not Applicable   Race Caucasian/White     Patient Assistance   Location of Patient Assistance Not Applicable   Patient's financial/insurance status Medicare   Uninsured Patient ([place] [employer] Connects) No   Patient referred to apply for the following financial assistance Not Applicable   Food insecurities addressed Not Applicable   Transportation assistance No   Assistance securing medications No   Educational health offerings Exercise/physical activity     Encounter Details   Primary purpose of visit Education/Health Concerns   Was an Emergency Department visit averted? Not Applicable   Does patient have a medical provider? Yes   Patient referred to Not Applicable   Was a mental health screening completed? (GAINS tool) No   Does patient have dental issues? No   Does patient have vision issues? No   Does your patient have an abnormal blood pressure today? No   Since previous encounter, have you referred patient for abnormal blood pressure that resulted in a new diagnosis or medication change? No   Does your patient have an abnormal blood glucose today? No   Since previous encounter, have you referred patient for abnormal blood glucose that resulted in a new diagnosis or medication change? No   Was there a life-saving intervention made? No    Attended chair yoga class.

## 2016-09-22 ENCOUNTER — Ambulatory Visit: Payer: Medicare Other | Admitting: Neurology

## 2016-10-06 DIAGNOSIS — R3914 Feeling of incomplete bladder emptying: Secondary | ICD-10-CM | POA: Diagnosis not present

## 2016-10-06 DIAGNOSIS — N3020 Other chronic cystitis without hematuria: Secondary | ICD-10-CM | POA: Diagnosis not present

## 2016-10-06 DIAGNOSIS — N8110 Cystocele, unspecified: Secondary | ICD-10-CM | POA: Diagnosis not present

## 2016-10-09 NOTE — Congregational Nurse Program (Signed)
Congregational Nurse Program Note  Date of Encounter: 10/09/2016  Past Medical History: Past Medical History:  Diagnosis Date  . Cancer ([place])   . Headache   . Hearing loss   . Hypertension   . Vision abnormalities     Encounter Details:     CNP Questionnaire - 10/09/16 1406      Patient Demographics   Is this a new or existing patient? Existing   Patient is considered a/an Not Applicable   Race Caucasian/White     Patient Assistance   Location of Patient Assistance Not Applicable   Patient's financial/insurance status Medicare   Uninsured Patient ([place] [employer]/Care Connects) No   Patient referred to apply for the following financial assistance Not Applicable   Food insecurities addressed Not Applicable   Transportation assistance No   Assistance securing medications No   Educational health offerings Exercise/physical activity     Encounter Details   Primary purpose of visit Education/Health Concerns   Was an Emergency Department visit averted? Not Applicable   Does patient have a medical provider? Yes   Patient referred to Not Applicable   Was a mental health screening completed? (GAINS tool) No   Does patient have dental issues? No   Does patient have vision issues? No   Does your patient have an abnormal blood pressure today? No   Since previous encounter, have you referred patient for abnormal blood pressure that resulted in a new diagnosis or medication change? No   Does your patient have an abnormal blood glucose today? No   Since previous encounter, have you referred patient for abnormal blood glucose that resulted in a new diagnosis or medication change? No   Was there a life-saving intervention made? No    Attended chair yoga class.

## 2016-10-13 DIAGNOSIS — R3914 Feeling of incomplete bladder emptying: Secondary | ICD-10-CM | POA: Diagnosis not present

## 2016-10-13 DIAGNOSIS — N3000 Acute cystitis without hematuria: Secondary | ICD-10-CM | POA: Diagnosis not present

## 2016-10-13 DIAGNOSIS — N8110 Cystocele, unspecified: Secondary | ICD-10-CM | POA: Diagnosis not present

## 2016-10-15 DIAGNOSIS — N8110 Cystocele, unspecified: Secondary | ICD-10-CM | POA: Diagnosis not present

## 2016-10-15 DIAGNOSIS — N3000 Acute cystitis without hematuria: Secondary | ICD-10-CM | POA: Diagnosis not present

## 2016-10-27 DIAGNOSIS — Z85820 Personal history of malignant melanoma of skin: Secondary | ICD-10-CM | POA: Diagnosis not present

## 2016-10-27 DIAGNOSIS — L299 Pruritus, unspecified: Secondary | ICD-10-CM | POA: Diagnosis not present

## 2016-10-27 DIAGNOSIS — L821 Other seborrheic keratosis: Secondary | ICD-10-CM | POA: Diagnosis not present

## 2016-10-27 DIAGNOSIS — D239 Other benign neoplasm of skin, unspecified: Secondary | ICD-10-CM | POA: Diagnosis not present

## 2016-10-27 DIAGNOSIS — L814 Other melanin hyperpigmentation: Secondary | ICD-10-CM | POA: Diagnosis not present

## 2016-10-27 DIAGNOSIS — L570 Actinic keratosis: Secondary | ICD-10-CM | POA: Diagnosis not present

## 2016-10-28 DIAGNOSIS — R3915 Urgency of urination: Secondary | ICD-10-CM | POA: Diagnosis not present

## 2016-10-30 ENCOUNTER — Encounter: Payer: Self-pay | Admitting: Neurology

## 2016-10-30 ENCOUNTER — Ambulatory Visit (INDEPENDENT_AMBULATORY_CARE_PROVIDER_SITE_OTHER): Payer: Medicare Other | Admitting: Neurology

## 2016-10-30 VITALS — BP 136/77 | HR 84 | Resp 16 | Ht 65.0 in | Wt 153.5 lb

## 2016-10-30 DIAGNOSIS — M7552 Bursitis of left shoulder: Secondary | ICD-10-CM

## 2016-10-30 DIAGNOSIS — G629 Polyneuropathy, unspecified: Secondary | ICD-10-CM | POA: Diagnosis not present

## 2016-10-30 DIAGNOSIS — M542 Cervicalgia: Secondary | ICD-10-CM | POA: Diagnosis not present

## 2016-10-30 DIAGNOSIS — G4733 Obstructive sleep apnea (adult) (pediatric): Secondary | ICD-10-CM

## 2016-10-30 DIAGNOSIS — M7550 Bursitis of unspecified shoulder: Secondary | ICD-10-CM | POA: Insufficient documentation

## 2016-10-30 DIAGNOSIS — R200 Anesthesia of skin: Secondary | ICD-10-CM | POA: Diagnosis not present

## 2016-10-30 MED ORDER — ETODOLAC 300 MG PO CAPS: 300.0000 mg | ORAL_CAPSULE | Freq: Two times a day (BID) | ORAL | 11 refills | Status: DC

## 2016-10-30 NOTE — Patient Instructions (Signed)
Using get alpha lipoic acid at many drugstores. Try to take 600 mg a day in divided dose.  If the lab work does not show [place] a cause of your nerve pain and you are not better in a few weeks give [place] a call and we can start gabapentin.

## 2016-10-30 NOTE — Progress Notes (Signed)
[facility]  PATIENT: Cynthia [name] [name] Berenson DOB: 1940/10/20 _________________________________   HISTORICAL  CHIEF COMPLAINT:  Chief Complaint  Patient presents with  . Sleep Apnea    Sts. she is compliant with CPAP ([facility]).  Sts. she is out of Etodolac, so is having more left shoulder discomfort.  Still taking Tizanidine and feels it helps.  Today c/o burning pain bottoms of both feet, onset sev. mos. ago. [place]  . Headaches  . Neck Pain    HISTORY OF PRESENT ILLNESS:  Ms. Cynthia [name] is a 76 yo woman with left neck pain and headache.  She is noting more burning sensation in her feet.   She has been tested for DM and was fine.    Burning pain:   She has a burning pain in her feet.   She puts on a lotion and takes a homeopathic drop with minimal benefit.    When she flexes her toes the distal sole feels strange.    Numbness goes up to the ankle and she also notes weak ankles.       Neck pain:   She is having more pain in the left shoulder and neck region.   She did not get much benefit from chiropractor. She was on etodolac and  Tizanidine.  She feels they help some.   She gets massage twice a month.     She tolerates the medications well.   No GI upset and only minimal sleepiness early morning.      HA's:   Headaches are not occurring much now.     She notes doing a lot of seated computer work and sometimes gets stiff.   She has had no severe headaches.  No visual changes, numbness or weakness.    Dry eye:   She also reports dry eyes.  SSA/SSB were normal.  Restasis has helped her dry eyes.     No rashes but she has more itching.      OSA:   She uses CPAP for OSA.Marland Kitchen   She uses a Resmed FF mask.  She is very compliant.   She does not snore through the mask.    She feels better when she sleeps the whole night with it.     Other:   She injured her left ankle/foot and is doing PT for a torn tendon.      REVIEW OF SYSTEMS: Constitutional: No fevers, chills, sweats, or  change in appetite Eyes: No visual changes, double vision, eye pain Ear, nose and throat: Chronic sinusutis.  Bilat hearing loss, worse on left  Cardiovascular: No chest pain, palpitations Respiratory: No shortness of breath at rest or with exertion.   No wheezes.  Has OSA GastrointestinaI: No nausea, vomiting, diarrhea, abdominal pain, fecal incontinence.   Occ dysphagia Genitourinary: Frequent UTIs.  SOme frequency. Musculoskeletal: as above Integumentary: No rash, pruritus, skin lesions Neurological: as above Psychiatric: No depression at this time.  No anxiety Endocrine: No palpitations, diaphoresis, change in appetite.  Increased thirst Hematologic/Lymphatic: No anemia, purpura, petechiae. Allergic/Immunologic: No itchy/runny eyes, recent allergic reactions, rashes  ALLERGIES: No Known Allergies  HOME MEDICATIONS:  Current Outpatient Prescriptions:  .  calcium carbonate 1250 MG capsule, Take 1,250 mg by mouth 2 (two) times daily with a meal. Reported on 07/09/2015, Disp: , Rfl:  .  cephALEXin (KEFLEX) 250 MG capsule, , Disp: , Rfl:  .  cholecalciferol (VITAMIN D) 1000 UNITS tablet, Take 1,000 Units by mouth daily., Disp: , Rfl:  .  co-enzyme Q-10 30 MG capsule, Take 30 mg by mouth 3 (three) times daily., Disp: , Rfl:  .  cycloSPORINE (RESTASIS) 0.05 % ophthalmic emulsion, Place 1 drop into both eyes 2 (two) times daily., Disp: , Rfl:  .  DULoxetine (CYMBALTA) 30 MG capsule, TAKE 1 CAPSULE (30 MG TOTAL) BY MOUTH 2 (TWO) TIMES DAILY., Disp: 60 capsule, Rfl: 5 .  etodolac (LODINE) 300 MG capsule, Take 1 capsule (300 mg total) by mouth 2 (two) times daily., Disp: 60 capsule, Rfl: 11 .  FLUZONE HIGH-DOSE 0.5 ML SUSY, TO BE ADMINISTERED BY PHARMACIST FOR IMMUNIZATION, Disp: , Rfl: 0 .  ipratropium (ATROVENT) 0.06 % nasal spray, 2 DROPS IN EACH NOSTRIL AS NEEDED TWICE A DAY NASALLY 30 DAYS, Disp: , Rfl: 1 .  losartan (COZAAR) 100 MG tablet, , Disp: , Rfl:  .  Meth-Hyo-M Bl-Na Phos-Ph  Sal (URIBEL) 118 MG CAPS, Take 1 capsule by mouth 2 (two) times daily., Disp: , Rfl: 3 .  Multiple Vitamin (MULTIVITAMIN WITH MINERALS) TABS tablet, Take 1 tablet by mouth daily., Disp: , Rfl:  .  omega-3 acid ethyl esters (LOVAZA) 1 G capsule, Take 1 g by mouth 2 (two) times daily., Disp: , Rfl:  .  pantoprazole (PROTONIX) 40 MG tablet, Take 40 mg by mouth daily., Disp: , Rfl: 1 .  simvastatin (ZOCOR) 20 MG tablet, Take 20 mg by mouth daily., Disp: , Rfl:  .  thyroid (ARMOUR) 60 MG tablet, Take 60 mg by mouth daily before breakfast., Disp: , Rfl:  .  tiZANidine (ZANAFLEX) 4 MG tablet, TAKE 1 TABLET BY MOUTH 3 TIMES A DAY AS NEEDED, Disp: 90 tablet, Rfl: 1 .  vitamin C (ASCORBIC ACID) 250 MG tablet, Take 250 mg by mouth daily., Disp: , Rfl:  .  oxybutynin (DITROPAN-XL) 10 MG 24 hr tablet, Take 10 mg by mouth daily., Disp: , Rfl: 11 .  triamcinolone cream (KENALOG) 0.1 %, 1 APPLICATION APPLY ON THE SKIN DAILY AS NEEDED, Disp: , Rfl: 2  PAST MEDICAL HISTORY: Past Medical History:  Diagnosis Date  . Cancer ([place])   . Headache   . Hearing loss   . Hypertension   . Vision abnormalities     PAST SURGICAL HISTORY: Past Surgical History:  Procedure Laterality Date  . APPENDECTOMY    . CRANIECTOMY FOR EXCISION OF ACOUSTIC NEUROMA    . PARTIAL HYSTERECTOMY    . TONSILLECTOMY      FAMILY HISTORY: Family History  Problem Relation Age of Onset  . Congestive Heart Failure Mother   . Heart disease Father     SOCIAL HISTORY:  Social History   Social History  . Marital status: Married    Spouse name: N/A  . Number of children: N/A  . Years of education: N/A   Occupational History  . Not on file.   Social History Main Topics  . Smoking status: Never Smoker  . Smokeless tobacco: Never Used  . Alcohol use No  . Drug use: No  . Sexual activity: Not on file   Other Topics Concern  . Not on file   Social History Narrative  . No narrative on file     PHYSICAL EXAM  Vitals:     10/30/16 1013  BP: 136/77  Pulse: 84  Resp: 16  Weight: 153 lb 8 oz (69.6 kg)  Height: _0  (1.651 m)    Body mass index is 25.54 kg/m.   General: The patient is well-developed and well-nourished and in no acute  distress  Neck: The neck is supple.  The neck is tender at the left occiput and trapezius muscle.    .     Skin: Extremities are without significant edema, rash.  No sclerodactyly  Musculoskeletal:  Back is non-tender.  Left shoulder is tender over the subacromial bursa and she has reduced ROM on the left.  Neurologic Exam  Mental status: The patient is alert and oriented x 3 at the time of the examination. The patient has normal recent and remote memory, with an apparently normal attention span and concentration ability.   Speech is normal.  Cranial nerves: Extraocular movements are full.   Facial strength and sensation is normal.  Trapezius strength is normal.   No dysarthria is noted.    Decreased hearing on left  Motor:  Muscle bulk is normal.   Tone is normal. Strength is  5 / 5 in all 4 extremities.   Sensory: Sensory testing is intact to soft touch in all 4 extremities.  Coordination: Cerebellar testing reveals good finger-nose-finger  bilaterally.  Gait and station: Station is normal.   Gait favors right ankle.   Tandem is wide     DIAGNOSTIC DATA (LABS, IMAGING, TESTING) - I reviewed patient records, labs, notes, testing and imaging myself where available.      ASSESSMENT AND PLAN  OSA (obstructive sleep apnea)  Polyneuropathy  Neck pain  Subacromial bursitis of left shoulder joint  Numbness   1.  Continue etodolac and tizanidine.   Renew. 2.  Remain active, exercise as tolerated.   Continue CPAP 3.   Trigger point inject the left splenius capitus, left C6-C7 paraspinal muscles and left trapezius muscle with 40 mg Depo-Medrol in [place] using sterile tevhnique. She tolerated the procedure well. 4.   Inject the left subacromial bursa  with 40 mg Depomedrol in [place] using sterile technique.  She tolerated the procedure well. 5.   Alpha lipoic acid 600-800 mg daily.   If not better add gabapentin. 6.   Check polyneuropathy labs:  B12/MMA, SPEP/IEF, ESR/CRP, ANA, cryoglobulin rtc 12 months for scheduled visit if stable.   If neck pain or shoulder pain worsens, she will call us back, or   Hansika Leaming A. Felecia Shelling, MD, PhD 09/08/4130, 44:01 AM Certified in Neurology, Clinical Neurophysiology, Sleep Medicine, Pain Medicine and Neuroimaging  [facility] [address], [place], [place] [address] [phone]

## 2016-11-04 LAB — MULTIPLE MYELOMA PANEL, SERUM
ALBUMIN/GLOB SERPL: 1.4 (ref 0.7–1.7)
Albumin SerPl Elph-Mcnc: 3.9 g/dL (ref 2.9–4.4)
Alpha 1: 0.2 g/dL (ref 0.0–0.4)
Alpha2 Glob SerPl Elph-Mcnc: 0.7 g/dL (ref 0.4–1.0)
B-GLOBULIN SERPL ELPH-MCNC: 0.9 g/dL (ref 0.7–1.3)
Gamma Glob SerPl Elph-Mcnc: 1.1 g/dL (ref 0.4–1.8)
Globulin, Total: 2.8 g/dL (ref 2.2–3.9)
IGM (IMMUNOGLOBULIN M), SRM: 130 mg/dL (ref 26–217)
IgA/Immunoglobulin A, Serum: 99 mg/dL (ref 64–422)
IgG (Immunoglobin G), Serum: 879 mg/dL (ref 700–1600)
Total Protein: 6.7 g/dL (ref 6.0–8.5)

## 2016-11-04 LAB — CRYOGLOBULIN

## 2016-11-04 LAB — METHYLMALONIC ACID, SERUM: METHYLMALONIC ACID: 189 nmol/L (ref 0–378)

## 2016-11-04 LAB — VITAMIN B12

## 2016-11-04 LAB — SEDIMENTATION RATE: SED RATE: 4 mm/h (ref 0–40)

## 2016-11-04 LAB — C-REACTIVE PROTEIN: CRP: 0.9 mg/L (ref 0.0–4.9)

## 2016-11-04 LAB — ANA W/REFLEX: ANA: NEGATIVE

## 2016-11-05 DIAGNOSIS — G608 Other hereditary and idiopathic neuropathies: Secondary | ICD-10-CM | POA: Diagnosis not present

## 2016-11-05 DIAGNOSIS — M76822 Posterior tibial tendinitis, left leg: Secondary | ICD-10-CM | POA: Diagnosis not present

## 2016-11-05 DIAGNOSIS — L84 Corns and callosities: Secondary | ICD-10-CM | POA: Diagnosis not present

## 2016-11-05 DIAGNOSIS — M76821 Posterior tibial tendinitis, right leg: Secondary | ICD-10-CM | POA: Diagnosis not present

## 2016-11-09 ENCOUNTER — Telehealth: Payer: Self-pay

## 2016-11-09 NOTE — Telephone Encounter (Signed)
-----   Message from [name], MD sent at 11/09/2016  7:54 AM EDT ----- Please let the patient know that the lab work is fine.

## 2016-11-09 NOTE — Telephone Encounter (Signed)
I called pt and advised her that per Dr. Felecia Shelling, her lab work is fine. Pt verbalized understanding of results. Pt had no questions at this time but was encouraged to call back if questions arise.

## 2016-11-12 NOTE — Congregational Nurse Program (Signed)
Congregational Nurse Program Note  Date of Encounter: 11/12/2016  Past Medical History: Past Medical History:  Diagnosis Date  . Cancer ([place])   . Headache   . Hearing loss   . Hypertension   . Vision abnormalities     Encounter Details:  Attended chair yoga class.

## 2016-11-23 ENCOUNTER — Inpatient Hospital Stay (HOSPITAL_COMMUNITY)
Admission: EM | Admit: 2016-11-23 | Discharge: 2016-11-25 | DRG: 287 | Disposition: A | Discharge status: Home / Self Care | Payer: Medicare Other | Attending: Family Medicine | Admitting: Family Medicine

## 2016-11-23 ENCOUNTER — Encounter (HOSPITAL_COMMUNITY): Payer: Self-pay | Admitting: *Deleted

## 2016-11-23 ENCOUNTER — Emergency Department (HOSPITAL_COMMUNITY): Payer: Medicare Other

## 2016-11-23 DIAGNOSIS — I10 Essential (primary) hypertension: Secondary | ICD-10-CM | POA: Diagnosis not present

## 2016-11-23 DIAGNOSIS — K219 Gastro-esophageal reflux disease without esophagitis: Secondary | ICD-10-CM

## 2016-11-23 DIAGNOSIS — H04123 Dry eye syndrome of bilateral lacrimal glands: Secondary | ICD-10-CM | POA: Diagnosis present

## 2016-11-23 DIAGNOSIS — F39 Unspecified mood [affective] disorder: Secondary | ICD-10-CM | POA: Diagnosis present

## 2016-11-23 DIAGNOSIS — K224 Dyskinesia of esophagus: Secondary | ICD-10-CM | POA: Diagnosis present

## 2016-11-23 DIAGNOSIS — R079 Chest pain, unspecified: Secondary | ICD-10-CM | POA: Diagnosis not present

## 2016-11-23 DIAGNOSIS — Z87440 Personal history of urinary (tract) infections: Secondary | ICD-10-CM

## 2016-11-23 DIAGNOSIS — E039 Hypothyroidism, unspecified: Secondary | ICD-10-CM | POA: Diagnosis present

## 2016-11-23 DIAGNOSIS — E785 Hyperlipidemia, unspecified: Secondary | ICD-10-CM

## 2016-11-23 DIAGNOSIS — M3500 Sicca syndrome, unspecified: Secondary | ICD-10-CM | POA: Diagnosis present

## 2016-11-23 DIAGNOSIS — Z9989 Dependence on other enabling machines and devices: Secondary | ICD-10-CM | POA: Diagnosis present

## 2016-11-23 DIAGNOSIS — J329 Chronic sinusitis, unspecified: Secondary | ICD-10-CM | POA: Diagnosis not present

## 2016-11-23 DIAGNOSIS — Z79899 Other long term (current) drug therapy: Secondary | ICD-10-CM

## 2016-11-23 DIAGNOSIS — R51 Headache: Secondary | ICD-10-CM | POA: Diagnosis present

## 2016-11-23 DIAGNOSIS — Z86010 Personal history of colonic polyps: Secondary | ICD-10-CM

## 2016-11-23 DIAGNOSIS — R9439 Abnormal result of other cardiovascular function study: Secondary | ICD-10-CM

## 2016-11-23 DIAGNOSIS — M25571 Pain in right ankle and joints of right foot: Secondary | ICD-10-CM | POA: Diagnosis present

## 2016-11-23 DIAGNOSIS — R0789 Other chest pain: Secondary | ICD-10-CM | POA: Diagnosis not present

## 2016-11-23 DIAGNOSIS — F319 Bipolar disorder, unspecified: Secondary | ICD-10-CM | POA: Diagnosis present

## 2016-11-23 DIAGNOSIS — G4733 Obstructive sleep apnea (adult) (pediatric): Secondary | ICD-10-CM | POA: Diagnosis present

## 2016-11-23 DIAGNOSIS — R072 Precordial pain: Secondary | ICD-10-CM | POA: Diagnosis not present

## 2016-11-23 DIAGNOSIS — N3281 Overactive bladder: Secondary | ICD-10-CM | POA: Diagnosis present

## 2016-11-23 DIAGNOSIS — I4891 Unspecified atrial fibrillation: Secondary | ICD-10-CM | POA: Diagnosis present

## 2016-11-23 LAB — BASIC METABOLIC PANEL
Anion gap: 8 (ref 5–15)
BUN: 19 mg/dL (ref 6–20)
CHLORIDE: 102 mmol/L (ref 101–111)
CO2: 30 mmol/L (ref 22–32)
Calcium: 9 mg/dL (ref 8.9–10.3)
Creatinine, Ser: 0.71 mg/dL (ref 0.44–1.00)
GFR calc Af Amer: >60 mL/min (ref >60)
Glucose, Bld: 102 mg/dL — ABNORMAL HIGH (ref 65–99)
POTASSIUM: 3.5 mmol/L (ref 3.5–5.1)
Sodium: 140 mmol/L (ref 135–145)

## 2016-11-23 LAB — TROPONIN I: Troponin I: <0.03 ng/mL (ref <0.03)

## 2016-11-23 LAB — CBC
HEMATOCRIT: 32.6 % — AB (ref 36.0–46.0)
Hemoglobin: 10.9 g/dL — ABNORMAL LOW (ref 12.0–15.0)
MCH: 30.5 pg (ref 26.0–34.0)
MCHC: 33.4 g/dL (ref 30.0–36.0)
MCV: 91.3 fL (ref 78.0–100.0)
Platelets: 145 10*3/uL — ABNORMAL LOW (ref 150–400)
RBC: 3.57 MIL/uL — ABNORMAL LOW (ref 3.87–5.11)
RDW: 13.3 % (ref 11.5–15.5)
WBC: 7.3 10*3/uL (ref 4.0–10.5)

## 2016-11-23 LAB — HEPATIC FUNCTION PANEL
ALK PHOS: 49 U/L (ref 38–126)
ALT: 20 U/L (ref 14–54)
AST: 22 U/L (ref 15–41)
Albumin: 3.7 g/dL (ref 3.5–5.0)
BILIRUBIN DIRECT: 0.1 mg/dL (ref 0.1–0.5)
BILIRUBIN INDIRECT: 0.7 mg/dL (ref 0.3–0.9)
BILIRUBIN TOTAL: 0.8 mg/dL (ref 0.3–1.2)
Total Protein: 6.3 g/dL — ABNORMAL LOW (ref 6.5–8.1)

## 2016-11-23 LAB — I-STAT TROPONIN, ED: Troponin i, poc: 0 ng/mL (ref 0.00–0.08)

## 2016-11-23 MED ORDER — ACETAMINOPHEN 325 MG PO TABS: 650.0000 mg | ORAL_TABLET | ORAL | Status: DC | PRN

## 2016-11-23 MED ORDER — LOSARTAN POTASSIUM 50 MG PO TABS
100.0000 mg | ORAL_TABLET | Freq: Every day | ORAL | Status: DC
  Administered 2016-11-24 – 2016-11-25 (×2): 100 mg via ORAL
  Filled 2016-11-23 (×2): qty 2

## 2016-11-23 MED ORDER — ONDANSETRON HCL 4 MG/2ML IJ SOLN: 4.0000 mg | Freq: Four times a day (QID) | INTRAMUSCULAR | Status: DC | PRN

## 2016-11-23 MED ORDER — ENOXAPARIN SODIUM 40 MG/0.4ML ~~LOC~~ SOLN
40.0000 mg | SUBCUTANEOUS | Status: DC
  Administered 2016-11-24: 40 mg via SUBCUTANEOUS
  Filled 2016-11-23: qty 0.4

## 2016-11-23 MED ORDER — SIMVASTATIN 20 MG PO TABS: 20.0000 mg | ORAL_TABLET | Freq: Every day | ORAL | Status: DC

## 2016-11-23 MED ORDER — THYROID 60 MG PO TABS
60.0000 mg | ORAL_TABLET | Freq: Every day | ORAL | Status: DC
  Administered 2016-11-24 – 2016-11-25 (×2): 60 mg via ORAL
  Filled 2016-11-23 (×2): qty 1

## 2016-11-23 MED ORDER — VITAMIN C 500 MG PO TABS
250.0000 mg | ORAL_TABLET | Freq: Every day | ORAL | Status: DC
  Administered 2016-11-24 – 2016-11-25 (×2): 250 mg via ORAL
  Filled 2016-11-23 (×2): qty 1

## 2016-11-23 MED ORDER — ETODOLAC 300 MG PO CAPS
300.0000 mg | ORAL_CAPSULE | Freq: Two times a day (BID) | ORAL | Status: DC
  Filled 2016-11-23: qty 1

## 2016-11-23 MED ORDER — PANTOPRAZOLE SODIUM 40 MG PO TBEC
40.0000 mg | DELAYED_RELEASE_TABLET | Freq: Every day | ORAL | Status: DC
  Administered 2016-11-24 – 2016-11-25 (×2): 40 mg via ORAL
  Filled 2016-11-23 (×2): qty 1

## 2016-11-23 MED ORDER — OXYBUTYNIN CHLORIDE ER 10 MG PO TB24
10.0000 mg | ORAL_TABLET | Freq: Every day | ORAL | Status: DC
Start: 2016-11-24 — End: 2016-11-25
  Administered 2016-11-24 (×2): 10 mg via ORAL
  Filled 2016-11-23 (×2): qty 1

## 2016-11-23 MED ORDER — OMEGA-3-ACID ETHYL ESTERS 1 G PO CAPS
1.0000 g | ORAL_CAPSULE | Freq: Two times a day (BID) | ORAL | Status: DC
  Administered 2016-11-24 – 2016-11-25 (×3): 1 g via ORAL
  Filled 2016-11-23 (×3): qty 1

## 2016-11-23 MED ORDER — CALCIUM CARBONATE 1250 (500 CA) MG PO TABS
1250.0000 mg | ORAL_TABLET | Freq: Two times a day (BID) | ORAL | Status: DC
  Administered 2016-11-24 – 2016-11-25 (×3): 1250 mg via ORAL
  Filled 2016-11-23 (×3): qty 1

## 2016-11-23 MED ORDER — DULOXETINE HCL 30 MG PO CPEP
30.0000 mg | ORAL_CAPSULE | Freq: Two times a day (BID) | ORAL | Status: DC
  Administered 2016-11-24 – 2016-11-25 (×4): 30 mg via ORAL
  Filled 2016-11-23 (×4): qty 1

## 2016-11-23 MED ORDER — ADULT MULTIVITAMIN W/MINERALS CH
1.0000 | ORAL_TABLET | Freq: Every day | ORAL | Status: DC
  Administered 2016-11-24 – 2016-11-25 (×2): 1 via ORAL
  Filled 2016-11-23 (×2): qty 1

## 2016-11-23 MED ORDER — CEPHALEXIN 250 MG PO CAPS
250.0000 mg | ORAL_CAPSULE | Freq: Every day | ORAL | Status: DC
  Administered 2016-11-24 (×2): 250 mg via ORAL
  Filled 2016-11-23 (×2): qty 1

## 2016-11-23 MED ORDER — MORPHINE SULFATE (PF) 4 MG/ML IV SOLN: 2.0000 mg | INTRAVENOUS | Status: DC | PRN

## 2016-11-23 MED ORDER — VITAMIN D 1000 UNITS PO TABS
1000.0000 [IU] | ORAL_TABLET | Freq: Every day | ORAL | Status: DC
  Administered 2016-11-24 – 2016-11-25 (×2): 1000 [IU] via ORAL
  Filled 2016-11-23 (×2): qty 1

## 2016-11-23 MED ORDER — TIZANIDINE HCL 4 MG PO TABS
4.0000 mg | ORAL_TABLET | Freq: Three times a day (TID) | ORAL | Status: DC | PRN
  Filled 2016-11-23: qty 1

## 2016-11-23 MED ORDER — ASPIRIN 81 MG PO CHEW
324.0000 mg | CHEWABLE_TABLET | Freq: Once | ORAL | Status: AC
  Administered 2016-11-23: 324 mg via ORAL
  Filled 2016-11-23: qty 4

## 2016-11-23 MED ORDER — CYCLOSPORINE 0.05 % OP EMUL
1.0000 [drp] | Freq: Two times a day (BID) | OPHTHALMIC | Status: DC
  Administered 2016-11-24 – 2016-11-25 (×3): 1 [drp] via OPHTHALMIC
  Filled 2016-11-23 (×3): qty 1

## 2016-11-23 NOTE — ED Notes (Signed)
Admitting MD in to assess pt for admission.  Pt resting quietly at this time.  Husband remains at bedside

## 2016-11-23 NOTE — ED Notes (Signed)
Cardiologist at bedside at this time to assess pt

## 2016-11-23 NOTE — Consult Note (Signed)
Cardiology Consultation:   Patient ID: LAYAH SKOUSEN; [id number]; 01-03-1941   Admit date: 11/23/2016 Date of Consult: 11/23/2016  Primary Care Provider: Deland Pretty, MD Primary Cardiologist: New   Patient Profile:   Cynthia [name] is a 76 y.o. female with a hx of HTN who is being seen today for the evaluation of chest pain at the request of Dr Teryl Lucy.  History of Present Illness:   Cynthia [name] is a pleasant married 76 y/o female with a history of HTN. She tells me she has had a prior evaluation for chest pain at [facility] some years ago and it was negative. She believed she had some type of stress test. She is sure she has never had a cath. There are no records of this in [facility].   The pt has noted some mid sternal chest pressure at night for the past month. It was relieved with changing her position. Today she had SSCP "7/10" while sitting at the computer. She had not previously had daytime symptoms and these symptoms were worse than usual. She denies any diaphoresis or dyspnea but did note some radiation to the throat. She called her PCP's office and they instructed her to call 911 which she did. [place] fire [place] and it was only a couple of minutes till EMS got there. She saud the pain was already improving but went away completely after she received a NTG SL and an ASA. She is currently pain free.   Past Medical History:  Diagnosis Date  . Cancer ([place])   . Hearing loss   . Hypertension     Past Surgical History:  Procedure Laterality Date  . APPENDECTOMY    . CRANIECTOMY FOR EXCISION OF ACOUSTIC NEUROMA    . PARTIAL HYSTERECTOMY    . TONSILLECTOMY      Prior to Admission medications   Medication Sig Start Date End Date Taking? Authorizing Provider  calcium carbonate 1250 MG capsule Take 1,250 mg by mouth 2 (two) times daily with a meal. Reported on 07/09/2015    [provider]  cephALEXin (KEFLEX) 250 MG capsule  06/22/14   [provider]  cholecalciferol (VITAMIN D) 1000 UNITS tablet Take 1,000 Units by mouth daily.    [provider]  co-enzyme Q-10 30 MG capsule Take 30 mg by mouth 3 (three) times daily.    [provider]  cycloSPORINE (RESTASIS) 0.05 % ophthalmic emulsion Place 1 drop into both eyes 2 (two) times daily.    [provider]  DULoxetine (CYMBALTA) 30 MG capsule TAKE 1 CAPSULE (30 MG TOTAL) BY MOUTH 2 (TWO) TIMES DAILY. 04/15/16   Sater, Nanine Means, MD  etodolac (LODINE) 300 MG capsule Take 1 capsule (300 mg total) by mouth 2 (two) times daily. 10/30/16   Sater, Nanine Means, MD  FLUZONE HIGH-DOSE 0.5 ML SUSY TO BE ADMINISTERED BY PHARMACIST FOR IMMUNIZATION 12/21/15   [provider]  ipratropium (ATROVENT) 0.06 % nasal spray 2 DROPS IN EACH NOSTRIL AS NEEDED TWICE A DAY NASALLY 30 DAYS 07/12/15   [provider]  losartan (COZAAR) 100 MG tablet  02/25/15   [provider]  Meth-Hyo-M Bl-Na Phos-Ph Sal (URIBEL) 118 MG CAPS Take 1 capsule by mouth 2 (two) times daily. 01/25/15   [provider]  Multiple Vitamin (MULTIVITAMIN WITH MINERALS) TABS tablet Take 1 tablet by mouth daily.    [provider]  omega-3 acid ethyl esters (LOVAZA) 1 G capsule Take  1 g by mouth 2 (two) times daily.    [provider]  oxybutynin (DITROPAN-XL) 10 MG 24 hr tablet Take 10 mg by mouth daily. 10/17/16   [provider]  pantoprazole (PROTONIX) 40 MG tablet Take 40 mg by mouth daily. 06/12/14   [provider]  simvastatin (ZOCOR) 20 MG tablet Take 20 mg by mouth daily.    [provider]  thyroid (ARMOUR) 60 MG tablet Take 60 mg by mouth daily before breakfast.    [provider]  tiZANidine (ZANAFLEX) 4 MG tablet TAKE 1 TABLET BY MOUTH 3 TIMES A DAY AS NEEDED 08/06/16   Sater, Nanine Means, MD  triamcinolone cream (KENALOG) 0.1 % 1 APPLICATION APPLY ON THE SKIN DAILY AS NEEDED  10/27/16   [provider]  vitamin C (ASCORBIC ACID) 250 MG tablet Take 250 mg by mouth daily.    [provider]    Allergies:   No Known Allergies  Social History:   Social History   Social History  . Marital status: Married    Spouse name: N/A  . Number of children: N/A  . Years of education: N/A   Occupational History  . Not on file.   Social History Main Topics  . Smoking status: Never Smoker  . Smokeless tobacco: Never Used  . Alcohol use Yes  . Drug use: No  . Sexual activity: Not on file   Other Topics Concern  . Not on file   Social History Narrative  . No narrative on file    Family History:    Family History  Problem Relation Age of Onset  . Congestive Heart Failure Mother   . Heart disease Father      ROS:  Please see the history of present illness.  ROS  She was seen at a memory clinic in [facility] this summer- no notes available She has chronic weakness in her ankles and wear braces All other ROS reviewed and negative.     Physical Exam/Data:   Vitals:   11/23/16 1530 11/23/16 1545 11/23/16 1630 11/23/16 1700  BP: (!) 147/65 (!) 153/83 (!) 163/82 (!) 151/80  Pulse: 65 63 67 63  Resp: (!) 24 13 20 16   Temp:      TempSrc:      SpO2: 96% 97% 97% 97%  Weight:      Height:       No intake or output data in the 24 hours ending 11/23/16 1807 Filed Weights   11/23/16 1423  Weight: 155 lb (70.3 kg)   Body mass index is 25.79 kg/m.  General:  Well nourished, well developed, in no acute distress HEENT: normal Lymph: no adenopathy Neck: no JVD Endocrine:  No thryomegaly Vascular: No carotid bruits; FA pulses 2+ bilaterally without bruits  Cardiac:  normal S1, S2; RRR; no murmur  Lungs:  clear to auscultation bilaterally, no wheezing, rhonchi or rales  Abd: soft, nontender, no hepatomegaly  Ext: no edema Musculoskeletal:  No deformities, BUE and BLE strength normal and equal She has braces on both ankles Skin:  warm and dry  Neuro:  CNs 2-12 intact, no focal abnormalities noted Psych:  Normal affect   EKG:  The EKG was personally reviewed and demonstrates:  NSR, LAD, NSST changes   Laboratory Data:  Chemistry Recent Labs Lab 11/23/16 1420  NA 140  K 3.5  CL 102  CO2 30  GLUCOSE 102*  BUN 19  CREATININE 0.71  CALCIUM 9.0  GFRNONAA >60  GFRAA >60  ANIONGAP 8     Recent Labs Lab 11/23/16 1420  PROT 6.3*  ALBUMIN 3.7  AST 22  ALT 20  ALKPHOS 49  BILITOT 0.8   Hematology Recent Labs Lab 11/23/16 1420  WBC 7.3  RBC 3.57*  HGB 10.9*  HCT 32.6*  MCV 91.3  MCH 30.5  MCHC 33.4  RDW 13.3  PLT 145*   Cardiac EnzymesNo results for input(s): TROPONINI in the last 168 hours.  Recent Labs Lab 11/23/16 1443  TROPIPOC 0.00    BNPNo results for input(s): BNP, PROBNP in the last 168 hours.  DDimer No results for input(s): DDIMER in the last 168 hours.  Radiology/Studies:  Dg Chest 2 View  Result Date: 11/23/2016 CLINICAL DATA:  Chest pressure EXAM: CHEST  2 VIEW COMPARISON:  None. FINDINGS: Normal heart size. Atherosclerotic thoracic aorta. Otherwise normal mediastinal contour. No pneumothorax. No pleural effusion. No pulmonary edema. No acute consolidative airspace disease. Mild scarring versus atelectasis at the costophrenic angles. IMPRESSION: Mild scarring versus atelectasis at the costophrenic angles. Otherwise no active disease in the chest. Electronically Signed   By: Ilona Sorrel M.D.   On: 11/23/2016 15:00    Assessment and Plan:   Chest pain with a moderate risk of cardiac etiology- She has risk factors- HTN, HLD, and documented ASPVD on CXR  HTN- On Lodine and Cozaar prior to admission  HLD- On Zocor and Lovaza prior to adm    Angelena Form, PA-C  11/23/2016 6:07 PM   History and all data above reviewed.  Patient examined.  I agree with the findings as above.  The patient has chest pain with atypical and typical features but no objective evidence  of ischemia.  Pain did improve after NTG.  It was similar to previous reflux but more intense.  She has otherwise been OK.  she does her housework.  She has not had any recent cardiovascular symptoms.  The patient denies any new symptoms such as neck or arm discomfort. There has been no new shortness of breath, PND or orthopnea. There have been no reported palpitations, presyncope or syncope.The patient exam reveals COR:RRR  ,  Lungs: Clear  ,  Abd: Positive bowel sounds, no rebound no guarding, Ext No edema  .  All available labs, radiology testing, previous records reviewed. Agree with documented assessment and plan. Chest pain:  Atypical greater than typical features.  Rule out MI.  POET (Plain Old Exercise Treadmill) in the AM if enzymes are negative.    Jeneen Rinks Katiya Fike  8:20 PM  11/23/2016

## 2016-11-23 NOTE — H&P (Signed)
History and Physical    Cynthia [name] UUV:[id number] DOB: 1940/12/01 DOA: 11/23/2016  PCP: Deland Pretty, MD Patient coming from: Home  Chief Complaint: Chest pain  HPI: Cynthia [name] is a 76 y.o. female with medical history significant of hypertension, hyperlipidemia, GERD. Patient presents with substernal pressure. She's been having episodes of substernal pressure for a few weeks right before going to bed that is improved with Tums. This morning, after coming home from walking with her dog, she developed some substernal chest pain in addition to bilateral jaw pain, but no radiation. She had no associated palpitations or dyspnea. She called her PCPs office who recommended calling EMS for transport to the ED. During EMS transport she received aspirin and nitroglycerin which helped her pain. She is currently chest pain-free.  ED Course: Vitals: Afebrile, pulse of 57, respirations of 14, Normotensive, on room air Labs: Glucose of 102, hemoglobin of 10.9, platelets of 145 Imaging: Chest x-ray significant for atelectasis vs scarring Medications/Course: Aspirin given. Nitro given via EMS.   Review of Systems: Review of Systems  Constitutional: Negative for chills and fever.  Respiratory: Negative for cough, sputum production, shortness of breath and wheezing.   Cardiovascular: Positive for chest pain. Negative for palpitations, orthopnea, claudication, leg swelling and PND.  Gastrointestinal: Negative for abdominal pain, constipation, diarrhea, heartburn, nausea and vomiting.  All other systems reviewed and are negative.   Past Medical History:  Diagnosis Date  . Cancer ([place])   . Headache   . Hearing loss   . Hypertension   . Vision abnormalities     Past Surgical History:  Procedure Laterality Date  . APPENDECTOMY    . CRANIECTOMY FOR EXCISION OF ACOUSTIC NEUROMA    . PARTIAL HYSTERECTOMY    . TONSILLECTOMY       reports that she has never smoked. She has never used smokeless  tobacco. She reports that she drinks alcohol. She reports that she does not use drugs.  No Known Allergies  Family History  Problem Relation Age of Onset  . Congestive Heart Failure Mother   . Heart disease Father     Prior to Admission medications   Medication Sig Start Date End Date Taking? Authorizing Provider  calcium carbonate 1250 MG capsule Take 1,250 mg by mouth 2 (two) times daily with a meal. Reported on 07/09/2015    [provider]  cephALEXin (KEFLEX) 250 MG capsule  06/22/14   [provider]  cholecalciferol (VITAMIN D) 1000 UNITS tablet Take 1,000 Units by mouth daily.    [provider]  co-enzyme Q-10 30 MG capsule Take 30 mg by mouth 3 (three) times daily.    [provider]  cycloSPORINE (RESTASIS) 0.05 % ophthalmic emulsion Place 1 drop into both eyes 2 (two) times daily.    [provider]  DULoxetine (CYMBALTA) 30 MG capsule TAKE 1 CAPSULE (30 MG TOTAL) BY MOUTH 2 (TWO) TIMES DAILY. 04/15/16   Sater, Nanine Means, MD  etodolac (LODINE) 300 MG capsule Take 1 capsule (300 mg total) by mouth 2 (two) times daily. 10/30/16   Sater, Nanine Means, MD  FLUZONE HIGH-DOSE 0.5 ML SUSY TO BE ADMINISTERED BY PHARMACIST FOR IMMUNIZATION 12/21/15   [provider]  ipratropium (ATROVENT) 0.06 % nasal spray 2 DROPS IN EACH NOSTRIL AS NEEDED TWICE A DAY NASALLY 30 DAYS 07/12/15   [provider]  losartan (COZAAR) 100 MG tablet  02/25/15   [provider]  Meth-Hyo-M Bl-Na Phos-Ph Sal (URIBEL) 118 MG CAPS  Take 1 capsule by mouth 2 (two) times daily. 01/25/15   [provider]  Multiple Vitamin (MULTIVITAMIN WITH MINERALS) TABS tablet Take 1 tablet by mouth daily.    [provider]  omega-3 acid ethyl esters (LOVAZA) 1 G capsule Take 1 g by mouth 2 (two) times daily.    [provider]  oxybutynin (DITROPAN-XL) 10 MG 24 hr tablet Take 10 mg by mouth daily. 10/17/16   [provider]    pantoprazole (PROTONIX) 40 MG tablet Take 40 mg by mouth daily. 06/12/14   [provider]  simvastatin (ZOCOR) 20 MG tablet Take 20 mg by mouth daily.    [provider]  thyroid (ARMOUR) 60 MG tablet Take 60 mg by mouth daily before breakfast.    [provider]  tiZANidine (ZANAFLEX) 4 MG tablet TAKE 1 TABLET BY MOUTH 3 TIMES A DAY AS NEEDED 08/06/16   Sater, Nanine Means, MD  triamcinolone cream (KENALOG) 0.1 % 1 APPLICATION APPLY ON THE SKIN DAILY AS NEEDED 10/27/16   [provider]  vitamin C (ASCORBIC ACID) 250 MG tablet Take 250 mg by mouth daily.    [provider]    Physical Exam: Vitals:   11/23/16 1422 11/23/16 1423  BP: 136/73   Pulse: (!) 57   Resp: 14   Temp: 98.6 F (37 C)   TempSrc: Oral   SpO2: 97%   Weight:  70.3 kg (155 lb)  Height:  5\' 5"  (1.651 m)     Constitutional: NAD, calm, comfortable Eyes: PERRL, lids and conjunctivae normal ENMT: Mucous membranes are moist. Posterior pharynx clear of any exudate or lesions. Normal dentition. Torus palatinus Neck: normal, supple, no masses, no thyromegaly Respiratory: clear to auscultation bilaterally, no wheezing, no crackles. Normal respiratory effort. No accessory muscle use.  Cardiovascular: Regular rate and rhythm, no murmurs / rubs / gallops. No extremity edema. 2+ pedal pulses. No carotid bruits.  Abdomen: no tenderness, no masses palpated. No hepatosplenomegaly. Bowel sounds positive.  Musculoskeletal: no clubbing / cyanosis. No joint deformity upper and lower extremities. Good ROM, no contractures. Normal muscle tone.  Skin: no rashes, lesions, ulcers. No induration Neurologic: CN 2-12 grossly intact. Sensation intact, DTR normal. Strength 5/5 in all 4.  Psychiatric: Normal judgment and insight. Alert and oriented x 3. Normal mood.    Labs on Admission: I have personally reviewed following labs and imaging studies  CBC:  Recent Labs Lab 11/23/16 1420  WBC 7.3   HGB 10.9*  HCT 32.6*  MCV 91.3  PLT 086*   Basic Metabolic Panel:  Recent Labs Lab 11/23/16 1420  NA 140  K 3.5  CL 102  CO2 30  GLUCOSE 102*  BUN 19  CREATININE 0.71  CALCIUM 9.0   GFR: Estimated Creatinine Clearance: 60.7 mL/min (by C-G formula based on SCr of 0.71 mg/dL). Liver Function Tests:  Recent Labs Lab 11/23/16 1420  AST 22  ALT 20  ALKPHOS 49  BILITOT 0.8  PROT 6.3*  ALBUMIN 3.7    Radiological Exams on Admission: Dg Chest 2 View  Result Date: 11/23/2016 CLINICAL DATA:  Chest pressure EXAM: CHEST  2 VIEW COMPARISON:  None. FINDINGS: Normal heart size. Atherosclerotic thoracic aorta. Otherwise normal mediastinal contour. No pneumothorax. No pleural effusion. No pulmonary edema. No acute consolidative airspace disease. Mild scarring versus atelectasis at the costophrenic angles. IMPRESSION: Mild scarring versus atelectasis at the costophrenic angles. Otherwise no active disease in the chest. Electronically Signed   By: Ilona Sorrel  M.D.   On: 11/23/2016 15:00    EKG: Independently reviewed. Non-specific t-wave flattening in V1, AVL  Assessment/Plan Principal Problem:   Chest pain Active Problems:   Sinusitis, chronic   OSA (obstructive sleep apnea)   Essential hypertension, benign   Dry eyes   Hyperlipidemia   GERD (gastroesophageal reflux disease)   History of recurrent UTIs   Chest pain Heart score 5. Sounds like it could be secondary to heartburn versus esophageal spasms. Symptoms improved with nitroglycerin which would also help in the setting of esophageal spasms. Patient with a previous stress test that was negative per patient. -Chest pain rule out -Cardiology recommendations -Cycle troponin, TSH, hemoglobin A1c, lipid panel -No heparin -morphine prn -cycle troponin  Essential hypertension Normotensive. -Continue home losartan  Hyperlipidemia -continue simvastatin  GERD -Continue Protonix -Continue  Tums  Hypothyroidism -Continue thyroid  History of recurrent UTIs -Continue Keflex  Overactive bladder -Continue oxybutynin  Mood disorder -Continue Cymbalta  Chronic sinusitis -Continue atrovent prn  Dry eyes -Continue Restasis  Obstructive sleep apnea -CPAP   DVT prophylaxis: Lovenox Code Status: Full code Family Communication: Husband at bedside Disposition Plan: Discharge home tomorrow pending workup Consults called: Cardiology, Dr. Percival Spanish. Will see patient today Admission status: Observation, telemetry   Cordelia Poche, MD [facility] Pager [phone]  If 7PM-7AM, please contact night-coverage [email or web address] Password [facility]  11/23/2016, 4:29 PM

## 2016-11-23 NOTE — ED Notes (Signed)
Pt sitting up on stretcher at this time.

## 2016-11-23 NOTE — ED Provider Notes (Signed)
[facility] DEPT Provider Note   CSN: [id number] Arrival date & time: 11/23/16  1414     History   Chief Complaint Chief Complaint  Patient presents with  . Chest Pain    HPI Cynthia [name] is a 76 y.o. female.  HPI Patient presents to the emergency room for evaluation of chest pain. Patient states she's been having intermittent chest pain primarily at night for the last few weeks. Patient states it's not occurring every night, only once in a while. It only seems to happen at night. The symptoms have not been very severe. This morning she had recurrent symptoms and it was more severe. She also had some jaw pain with it. Patient called her doctor's office and was told to go to the emergency room. She denies any shortness of breath. No vomiting or diarrhea.  Patient denies any history of heart disease or lung disease. She states she has had episodes of chest pain in the past and has had normal stress tests.  Past Medical History:  Diagnosis Date  . Cancer ([place])   . Headache   . Hearing loss   . Hypertension   . Vision abnormalities     Patient Active Problem List   Diagnosis Date Noted  . Polyneuropathy 10/30/2016  . Subacromial bursitis of left shoulder joint 10/30/2016  . Numbness 10/30/2016  . Arthritis, senescent 01/03/2016  . Left knee pain 01/03/2016  . Pain in joint, ankle and foot 02/28/2015  . Dry mouth 08/07/2014  . Dry eyes 08/07/2014  . Other headache syndrome 06/25/2014  . Sinusitis, chronic 06/25/2014  . Neck pain 06/25/2014  . OSA (obstructive sleep apnea) 06/25/2014  . Essential hypertension, benign 06/25/2014    Past Surgical History:  Procedure Laterality Date  . APPENDECTOMY    . CRANIECTOMY FOR EXCISION OF ACOUSTIC NEUROMA    . PARTIAL HYSTERECTOMY    . TONSILLECTOMY      OB History    No data available       Home Medications    Prior to Admission medications   Medication Sig Start Date End Date Taking? Authorizing Provider    calcium carbonate 1250 MG capsule Take 1,250 mg by mouth 2 (two) times daily with a meal. Reported on 07/09/2015    [provider]  cephALEXin (KEFLEX) 250 MG capsule  06/22/14   [provider]  cholecalciferol (VITAMIN D) 1000 UNITS tablet Take 1,000 Units by mouth daily.    [provider]  co-enzyme Q-10 30 MG capsule Take 30 mg by mouth 3 (three) times daily.    [provider]  cycloSPORINE (RESTASIS) 0.05 % ophthalmic emulsion Place 1 drop into both eyes 2 (two) times daily.    [provider]  DULoxetine (CYMBALTA) 30 MG capsule TAKE 1 CAPSULE (30 MG TOTAL) BY MOUTH 2 (TWO) TIMES DAILY. 04/15/16   Sater, Nanine Means, MD  etodolac (LODINE) 300 MG capsule Take 1 capsule (300 mg total) by mouth 2 (two) times daily. 10/30/16   Sater, Nanine Means, MD  FLUZONE HIGH-DOSE 0.5 ML SUSY TO BE ADMINISTERED BY PHARMACIST FOR IMMUNIZATION 12/21/15   [provider]  ipratropium (ATROVENT) 0.06 % nasal spray 2 DROPS IN EACH NOSTRIL AS NEEDED TWICE A DAY NASALLY 30 DAYS 07/12/15   [provider]  losartan (COZAAR) 100 MG tablet  02/25/15   [provider]  Meth-Hyo-M Bl-Na Phos-Ph Sal (URIBEL) 118 MG CAPS Take 1 capsule by mouth 2 (two) times daily. 01/25/15   [provider]  Multiple Vitamin (MULTIVITAMIN WITH MINERALS) TABS tablet Take 1 tablet by mouth daily.    [provider]  omega-3 acid ethyl esters (LOVAZA) 1 G capsule Take 1 g by mouth 2 (two) times daily.    [provider]  oxybutynin (DITROPAN-XL) 10 MG 24 hr tablet Take 10 mg by mouth daily. 10/17/16   [provider]  pantoprazole (PROTONIX) 40 MG tablet Take 40 mg by mouth daily. 06/12/14   [provider]  simvastatin (ZOCOR) 20 MG tablet Take 20 mg by mouth daily.    [provider]  thyroid (ARMOUR) 60 MG tablet Take 60 mg by mouth daily before breakfast.    [provider]  tiZANidine (ZANAFLEX) 4 MG tablet TAKE 1  TABLET BY MOUTH 3 TIMES A DAY AS NEEDED 08/06/16   Sater, Nanine Means, MD  triamcinolone cream (KENALOG) 0.1 % 1 APPLICATION APPLY ON THE SKIN DAILY AS NEEDED 10/27/16   [provider]  vitamin C (ASCORBIC ACID) 250 MG tablet Take 250 mg by mouth daily.    [provider]    Family History Family History  Problem Relation Age of Onset  . Congestive Heart Failure Mother   . Heart disease Father     Social History Social History  Substance Use Topics  . Smoking status: Never Smoker  . Smokeless tobacco: Never Used  . Alcohol use Yes     Allergies   Patient has no known allergies.   Review of Systems Review of Systems  All other systems reviewed and are negative.    Physical Exam Updated Vital Signs BP 136/73 (BP Location: Right Arm)   Pulse (!) 57   Temp 98.6 F (37 C) (Oral)   Resp 14   Ht 1.651 m (5\' 5" )   Wt 70.3 kg (155 lb)   SpO2 97%   BMI 25.79 kg/m   Physical Exam  Constitutional: She appears well-developed and well-nourished. No distress.  HENT:  Head: Normocephalic and atraumatic.  Right Ear: External ear normal.  Left Ear: External ear normal.  Eyes: Conjunctivae are normal. Right eye exhibits no discharge. Left eye exhibits no discharge. No scleral icterus.  Neck: Neck supple. No tracheal deviation present.  Cardiovascular: Normal rate, regular rhythm and intact distal pulses.   Pulmonary/Chest: Effort normal and breath sounds normal. No stridor. No respiratory distress. She has no wheezes. She has no rales.  Abdominal: Soft. Bowel sounds are normal. She exhibits no distension. There is no tenderness. There is no rebound and no guarding.  Musculoskeletal: She exhibits no edema or tenderness.  Neurological: She is alert. She has normal strength. No cranial nerve deficit (no facial droop, extraocular movements intact, no slurred speech) or sensory deficit. She exhibits normal muscle tone. She displays no seizure activity. Coordination  normal.  Skin: Skin is warm and dry. No rash noted.  Psychiatric: She has a normal mood and affect.  Nursing note and vitals reviewed.    ED Treatments / Results  Labs (all labs ordered are listed, but only abnormal results are displayed) Labs Reviewed  BASIC METABOLIC PANEL - Abnormal; Notable for the following:       Result Value   Glucose, Bld 102 (*)    All other components within normal limits  CBC - Abnormal; Notable for the following:    RBC 3.57 (*)    Hemoglobin 10.9 (*)    HCT 32.6 (*)    Platelets 145 (*)    All other components within  normal limits  HEPATIC FUNCTION PANEL - Abnormal; Notable for the following:    Total Protein 6.3 (*)    All other components within normal limits  I-STAT TROPONIN, ED    EKG  EKG Interpretation  Date/Time:  Monday November 23 2016 14:27:28 EDT Ventricular Rate:  67 PR Interval:    QRS Duration: 103 QT Interval:  428 QTC Calculation: 452 R Axis:   -46 Text Interpretation:  Sinus rhythm LAD, consider left anterior fascicular block Nonspecific T abnormalities, lateral leads No significant change since last tracing Confirmed by Dorie Rank [phone]) on 11/23/2016 2:43:41 PM       Radiology Dg Chest 2 View  Result Date: 11/23/2016 CLINICAL DATA:  Chest pressure EXAM: CHEST  2 VIEW COMPARISON:  None. FINDINGS: Normal heart size. Atherosclerotic thoracic aorta. Otherwise normal mediastinal contour. No pneumothorax. No pleural effusion. No pulmonary edema. No acute consolidative airspace disease. Mild scarring versus atelectasis at the costophrenic angles. IMPRESSION: Mild scarring versus atelectasis at the costophrenic angles. Otherwise no active disease in the chest. Electronically Signed   By: Ilona Sorrel M.D.   On: 11/23/2016 15:00    Procedures Procedures (including critical care time)  Medications Ordered in ED Medications  aspirin chewable tablet 324 mg (324 mg Oral Given 11/23/16 1559)     Initial Impression / Assessment  and Plan / ED Course  I have reviewed the triage vital signs and the nursing notes.  Pertinent labs & imaging results that were available during my care of the patient were reviewed by me and considered in my medical decision making (see chart for details).   the patient presents to the emergency room for evaluation of chest pain. She has no known history of heart disease. She called EMS and was given aspirin and nitroglycerin with total relief. The patient's symptoms are somewhat atypical in that it was mostly occurring at night however she has a moderate heart score and considering the history I think it is reasonable to bring her in for cardiac rule out and further evaluation.   Final Clinical Impressions(s) / ED Diagnoses   Final diagnoses:  Chest pain, unspecified type      Dorie Rank, MD 11/23/16 [phone]

## 2016-11-23 NOTE — ED Notes (Signed)
Cynthia [name]  Husband  Cell number [phone]

## 2016-11-23 NOTE — ED Triage Notes (Signed)
Presents to ed via [facility] states she started having chest pressure around 10-11 am today after breakfast states she tried an alka-Sezter without relief. C/o pressure of and on x 1 month. Ems gave patient asa324 mg and Ntg x 1 with total relief. States the pain is worse at night. .States today pain was worse. C/o feeling very tired and decreased energy.

## 2016-11-23 NOTE — ED Notes (Signed)
Ambulatory to bathroom without any problems

## 2016-11-24 ENCOUNTER — Observation Stay (HOSPITAL_COMMUNITY): Payer: Medicare Other

## 2016-11-24 ENCOUNTER — Observation Stay (HOSPITAL_BASED_OUTPATIENT_CLINIC_OR_DEPARTMENT_OTHER): Payer: Medicare Other

## 2016-11-24 ENCOUNTER — Encounter (HOSPITAL_COMMUNITY)
Admission: EM | Disposition: A | Discharge status: Home / Self Care | Payer: Self-pay | Source: Home / Self Care | Attending: Family Medicine

## 2016-11-24 DIAGNOSIS — K219 Gastro-esophageal reflux disease without esophagitis: Secondary | ICD-10-CM | POA: Diagnosis not present

## 2016-11-24 DIAGNOSIS — G4733 Obstructive sleep apnea (adult) (pediatric): Secondary | ICD-10-CM | POA: Diagnosis not present

## 2016-11-24 DIAGNOSIS — F39 Unspecified mood [affective] disorder: Secondary | ICD-10-CM | POA: Diagnosis present

## 2016-11-24 DIAGNOSIS — H04123 Dry eye syndrome of bilateral lacrimal glands: Secondary | ICD-10-CM | POA: Diagnosis not present

## 2016-11-24 DIAGNOSIS — J329 Chronic sinusitis, unspecified: Secondary | ICD-10-CM | POA: Diagnosis present

## 2016-11-24 DIAGNOSIS — K224 Dyskinesia of esophagus: Secondary | ICD-10-CM | POA: Diagnosis present

## 2016-11-24 DIAGNOSIS — Z79899 Other long term (current) drug therapy: Secondary | ICD-10-CM | POA: Diagnosis not present

## 2016-11-24 DIAGNOSIS — I200 Unstable angina: Secondary | ICD-10-CM

## 2016-11-24 DIAGNOSIS — E785 Hyperlipidemia, unspecified: Secondary | ICD-10-CM | POA: Diagnosis not present

## 2016-11-24 DIAGNOSIS — E039 Hypothyroidism, unspecified: Secondary | ICD-10-CM | POA: Diagnosis present

## 2016-11-24 DIAGNOSIS — R079 Chest pain, unspecified: Secondary | ICD-10-CM

## 2016-11-24 DIAGNOSIS — Z86010 Personal history of colonic polyps: Secondary | ICD-10-CM | POA: Diagnosis not present

## 2016-11-24 DIAGNOSIS — R9439 Abnormal result of other cardiovascular function study: Secondary | ICD-10-CM

## 2016-11-24 DIAGNOSIS — I10 Essential (primary) hypertension: Secondary | ICD-10-CM | POA: Diagnosis not present

## 2016-11-24 DIAGNOSIS — R51 Headache: Secondary | ICD-10-CM | POA: Diagnosis present

## 2016-11-24 DIAGNOSIS — N3281 Overactive bladder: Secondary | ICD-10-CM | POA: Diagnosis present

## 2016-11-24 DIAGNOSIS — I4891 Unspecified atrial fibrillation: Secondary | ICD-10-CM | POA: Diagnosis present

## 2016-11-24 DIAGNOSIS — F319 Bipolar disorder, unspecified: Secondary | ICD-10-CM | POA: Diagnosis present

## 2016-11-24 DIAGNOSIS — M25571 Pain in right ankle and joints of right foot: Secondary | ICD-10-CM | POA: Diagnosis present

## 2016-11-24 DIAGNOSIS — I480 Paroxysmal atrial fibrillation: Secondary | ICD-10-CM | POA: Diagnosis not present

## 2016-11-24 DIAGNOSIS — R072 Precordial pain: Secondary | ICD-10-CM | POA: Diagnosis present

## 2016-11-24 DIAGNOSIS — Z87440 Personal history of urinary (tract) infections: Secondary | ICD-10-CM | POA: Diagnosis not present

## 2016-11-24 DIAGNOSIS — M3500 Sicca syndrome, unspecified: Secondary | ICD-10-CM | POA: Diagnosis present

## 2016-11-24 HISTORY — PX: LEFT HEART CATH AND CORONARY ANGIOGRAPHY: CATH118249

## 2016-11-24 LAB — CBC
HCT: 35.5 % — ABNORMAL LOW (ref 36.0–46.0)
HEMOGLOBIN: 11.6 g/dL — AB (ref 12.0–15.0)
MCH: 29.6 pg (ref 26.0–34.0)
MCHC: 32.7 g/dL (ref 30.0–36.0)
MCV: 90.6 fL (ref 78.0–100.0)
PLATELETS: 159 10*3/uL (ref 150–400)
RBC: 3.92 MIL/uL (ref 3.87–5.11)
RDW: 13.3 % (ref 11.5–15.5)
WBC: 4.9 10*3/uL (ref 4.0–10.5)

## 2016-11-24 LAB — LIPID PANEL
Cholesterol: 165 mg/dL (ref 0–200)
HDL: 61 mg/dL (ref >40)
LDL CALC: 86 mg/dL (ref 0–99)
Total CHOL/HDL Ratio: 2.7 RATIO
Triglycerides: 88 mg/dL (ref <150)
VLDL: 18 mg/dL (ref 0–40)

## 2016-11-24 LAB — TROPONIN I

## 2016-11-24 LAB — EXERCISE TOLERANCE TEST
Estimated workload: 3.2 METS
Exercise duration (min): 1 min
Exercise duration (sec): 24 s
MPHR: 124 {beats}/min
Peak HR: 123 {beats}/min
Percent HR: 84 %
Rest HR: 71 {beats}/min

## 2016-11-24 LAB — ECHOCARDIOGRAM COMPLETE
HEIGHTINCHES: 65 in
WEIGHTICAEL: 2372.8 [oz_av]

## 2016-11-24 LAB — T4, FREE: Free T4: 0.87 ng/dL (ref 0.61–1.12)

## 2016-11-24 LAB — TSH: TSH: 0.148 u[IU]/mL — ABNORMAL LOW (ref 0.350–4.500)

## 2016-11-24 SURGERY — LEFT HEART CATH AND CORONARY ANGIOGRAPHY
Anesthesia: LOCAL

## 2016-11-24 MED ORDER — SODIUM CHLORIDE 0.9 % WEIGHT BASED INFUSION: 1.0000 mL/kg/h | INTRAVENOUS | Status: DC

## 2016-11-24 MED ORDER — RIVAROXABAN 20 MG PO TABS: 20.0000 mg | ORAL_TABLET | Freq: Every day | ORAL | 0 refills | Status: DC

## 2016-11-24 MED ORDER — MIDAZOLAM HCL 2 MG/2ML IJ SOLN
INTRAMUSCULAR | Status: AC
  Filled 2016-11-24: qty 2

## 2016-11-24 MED ORDER — LIDOCAINE HCL (PF) 1 % IJ SOLN
INTRAMUSCULAR | Status: DC | PRN
  Administered 2016-11-24: 2 mL

## 2016-11-24 MED ORDER — SODIUM CHLORIDE 0.9 % WEIGHT BASED INFUSION: 3.0000 mL/kg/h | INTRAVENOUS | Status: DC

## 2016-11-24 MED ORDER — ATORVASTATIN CALCIUM 10 MG PO TABS: 10.0000 mg | ORAL_TABLET | Freq: Every day | ORAL | 0 refills | Status: DC

## 2016-11-24 MED ORDER — SODIUM CHLORIDE 0.9 % IV SOLN: 250.0000 mL | INTRAVENOUS | Status: DC | PRN

## 2016-11-24 MED ORDER — RIVAROXABAN 20 MG PO TABS: 20.0000 mg | ORAL_TABLET | Freq: Every day | ORAL | Status: DC

## 2016-11-24 MED ORDER — VERAPAMIL HCL 2.5 MG/ML IV SOLN
INTRAVENOUS | Status: AC
  Filled 2016-11-24: qty 2

## 2016-11-24 MED ORDER — HEPARIN (PORCINE) IN NACL 2-0.9 UNIT/ML-% IJ SOLN
INTRAMUSCULAR | Status: AC
  Filled 2016-11-24: qty 500

## 2016-11-24 MED ORDER — FENTANYL CITRATE (PF) 100 MCG/2ML IJ SOLN
INTRAMUSCULAR | Status: AC
  Filled 2016-11-24: qty 2

## 2016-11-24 MED ORDER — MIDAZOLAM HCL 2 MG/2ML IJ SOLN
INTRAMUSCULAR | Status: DC | PRN
  Administered 2016-11-24 (×2): 1 mg via INTRAVENOUS

## 2016-11-24 MED ORDER — SODIUM CHLORIDE 0.9% FLUSH: 3.0000 mL | Freq: Two times a day (BID) | INTRAVENOUS | Status: DC

## 2016-11-24 MED ORDER — HEPARIN (PORCINE) IN NACL 2-0.9 UNIT/ML-% IJ SOLN
INTRAMUSCULAR | Status: DC | PRN
  Administered 2016-11-24: 10 mL via INTRA_ARTERIAL

## 2016-11-24 MED ORDER — IOPAMIDOL (ISOVUE-370) INJECTION 76%
INTRAVENOUS | Status: AC
  Filled 2016-11-24: qty 100

## 2016-11-24 MED ORDER — ACETAMINOPHEN 325 MG PO TABS: 650.0000 mg | ORAL_TABLET | ORAL | Status: DC | PRN

## 2016-11-24 MED ORDER — IOPAMIDOL (ISOVUE-370) INJECTION 76%
INTRAVENOUS | Status: DC | PRN
  Administered 2016-11-24: 70 mL via INTRA_ARTERIAL

## 2016-11-24 MED ORDER — SODIUM CHLORIDE 0.9% FLUSH: 3.0000 mL | INTRAVENOUS | Status: DC | PRN

## 2016-11-24 MED ORDER — HEPARIN (PORCINE) IN NACL 2-0.9 UNIT/ML-% IJ SOLN
INTRAMUSCULAR | Status: AC | PRN
  Administered 2016-11-24: 1000 mL

## 2016-11-24 MED ORDER — HEPARIN SODIUM (PORCINE) 1000 UNIT/ML IJ SOLN
INTRAMUSCULAR | Status: DC | PRN
  Administered 2016-11-24: 3500 [IU] via INTRAVENOUS

## 2016-11-24 MED ORDER — ONDANSETRON HCL 4 MG/2ML IJ SOLN: 4.0000 mg | Freq: Four times a day (QID) | INTRAMUSCULAR | Status: DC | PRN

## 2016-11-24 MED ORDER — DILTIAZEM HCL ER COATED BEADS 120 MG PO CP24: 120.0000 mg | ORAL_CAPSULE | Freq: Every day | ORAL | 0 refills | Status: DC

## 2016-11-24 MED ORDER — FENTANYL CITRATE (PF) 100 MCG/2ML IJ SOLN
INTRAMUSCULAR | Status: DC | PRN
  Administered 2016-11-24 (×2): 25 ug via INTRAVENOUS

## 2016-11-24 MED ORDER — LIDOCAINE HCL (PF) 1 % IJ SOLN
INTRAMUSCULAR | Status: AC
  Filled 2016-11-24: qty 30

## 2016-11-24 MED ORDER — SODIUM CHLORIDE 0.9 % IV SOLN: INTRAVENOUS | Status: AC

## 2016-11-24 MED ORDER — ASPIRIN 81 MG PO CHEW: 81.0000 mg | CHEWABLE_TABLET | ORAL | Status: DC

## 2016-11-24 MED ORDER — HEPARIN SODIUM (PORCINE) 1000 UNIT/ML IJ SOLN
INTRAMUSCULAR | Status: AC
  Filled 2016-11-24: qty 1

## 2016-11-24 MED ORDER — ATORVASTATIN CALCIUM 10 MG PO TABS
10.0000 mg | ORAL_TABLET | Freq: Every day | ORAL | Status: DC
  Administered 2016-11-24: 10 mg via ORAL
  Filled 2016-11-24: qty 1

## 2016-11-24 MED ORDER — DILTIAZEM HCL ER COATED BEADS 120 MG PO CP24
120.0000 mg | ORAL_CAPSULE | Freq: Every day | ORAL | Status: DC
  Administered 2016-11-24 – 2016-11-25 (×2): 120 mg via ORAL
  Filled 2016-11-24 (×2): qty 1

## 2016-11-24 SURGICAL SUPPLY — 11 items
CATH EXPO 5F FL3.5 (CATHETERS) ×2 IMPLANT
CATH EXPO 5FR FR4 (CATHETERS) ×2 IMPLANT
DEVICE RAD COMP TR BAND LRG (VASCULAR PRODUCTS) ×2 IMPLANT
GLIDESHEATH SLEND SS 6F .021 (SHEATH) ×2 IMPLANT
GUIDEWIRE INQWIRE 1.5J.035X260 (WIRE) ×1 IMPLANT
INQWIRE 1.5J .035X260CM (WIRE) ×2
KIT HEART LEFT (KITS) ×2 IMPLANT
PACK CARDIAC CATHETERIZATION (CUSTOM PROCEDURE TRAY) ×2 IMPLANT
TRANSDUCER W/STOPCOCK (MISCELLANEOUS) ×2 IMPLANT
TUBING CIL FLEX 10 FLL-RA (TUBING) ×2 IMPLANT
WIRE HI TORQ VERSACORE-J 145CM (WIRE) ×2 IMPLANT

## 2016-11-24 NOTE — Progress Notes (Signed)
PROGRESS NOTE    Cynthia [name]  LEX:[id number] DOB: Oct 17, 1940 DOA: 11/23/2016 PCP: Deland Pretty, MD   Brief Narrative: Cynthia [name] is a 76 y.o. female with medical history significant of hypertension, hyperlipidemia, GERD. He presented with substernal chest discomfort concerning for possible ACS. Heart score of 5. Troponin negative overnight with nonspecific EKG. Cardiology on board implant for stress test today.   Assessment & Plan:   Principal Problem:   Chest pain Active Problems:   Sinusitis, chronic   OSA (obstructive sleep apnea)   Essential hypertension, benign   Dry eyes   Hyperlipidemia   GERD (gastroesophageal reflux disease)   History of recurrent UTIs   Chest pain Atypical sounding per history. Troponin negative.. Moderate to high-risk patient. -Cardiology recommendations: Stress test  Atrial fibrillation with RVR New onset overnight. -Cardiography recommendations: Diltiazem and Xarelto  Essential hypertension Normotensive. -Continue home losartan  Hyperlipidemia -Simvastatin changed to atorvastatin secondary to drug interaction with Xarelto  GERD -Continue Protonix -Continue Tums  Hypothyroidism TSH low with normal free T4 -Continue home dose of thyroid  History of recurrent UTIs -Continue Keflex  Overactive bladder -Continue oxybutynin  Mood disorder -Continue Cymbalta  Chronic sinusitis -Continue atrovent prn  Dry eyes -Continue Restasis  Obstructive sleep apnea -CPAP   DVT prophylaxis: Lovenox Code Status: Full code Family Communication: None at bedside Disposition Plan: Discharge pending stresses results   Consultants:   Cardiology  Procedures:   Stress test (8/14)  Antimicrobials:  Keflex (home regimen)   Subjective: Patient reports no chest pain dyspnea. She reports any upper this morning and having some palpitations when she sat down in bed.  Objective: Vitals:   11/24/16 0430 11/24/16 0500  11/24/16 0807 11/24/16 1217  BP: (!) 166/77 (!) 143/119 (!) 153/91 (!) 146/73  Pulse: 66 85 (!) 101 72  Resp: 16     Temp: 98.2 F (36.8 C)  98.3 F (36.8 C) 98 F (36.7 C)  TempSrc: Oral  Oral Oral  SpO2: 96% (!) 89% 95% 96%  Weight:      Height:        Intake/Output Summary (Last 24 hours) at 11/24/16 1441 Last data filed at 11/24/16 0116  Gross per 24 hour  Intake              120 ml  Output                0 ml  Net              120 ml   Filed Weights   11/23/16 1423 11/23/16 2345  Weight: 70.3 kg (155 lb) 67.3 kg (148 lb 4.8 oz)    Examination:  General exam: Appears calm and comfortable Respiratory system: Clear to auscultation. Respiratory effort normal. Cardiovascular system: S1 & S2 heard, RRR. No murmurs. Gastrointestinal system: Abdomen is nondistended, soft and nontender. No organomegaly or masses felt. Normal bowel sounds heard. Central nervous system: Alert and oriented. No focal neurological deficits. Extremities: No edema. No calf tenderness Skin: No cyanosis. No rashes Psychiatry: Judgement and insight appear normal. Mood & affect appropriate.     Data Reviewed: I have personally reviewed following labs and imaging studies  CBC:  Recent Labs Lab 11/23/16 1420 11/24/16 0636  WBC 7.3 4.9  HGB 10.9* 11.6*  HCT 32.6* 35.5*  MCV 91.3 90.6  PLT 145* 449   Basic Metabolic Panel:  Recent Labs Lab 11/23/16 1420  NA 140  K 3.5  CL 102  CO2 30  GLUCOSE 102*  BUN 19  CREATININE 0.71  CALCIUM 9.0   GFR: Estimated Creatinine Clearance: 55.5 mL/min (by C-G formula based on SCr of 0.71 mg/dL). Liver Function Tests:  Recent Labs Lab 11/23/16 1420  AST 22  ALT 20  ALKPHOS 49  BILITOT 0.8  PROT 6.3*  ALBUMIN 3.7   No results for input(s): LIPASE, AMYLASE in the last 168 hours. No results for input(s): AMMONIA in the last 168 hours. Coagulation Profile: No results for input(s): INR, PROTIME in the last 168 hours. Cardiac  Enzymes:  Recent Labs Lab 11/23/16 2200 11/24/16 0040 11/24/16 0636  TROPONINI <0.03 <0.03 <0.03   BNP (last 3 results) No results for input(s): PROBNP in the last 8760 hours. HbA1C: No results for input(s): HGBA1C in the last 72 hours. CBG: No results for input(s): GLUCAP in the last 168 hours. Lipid Profile:  Recent Labs  11/24/16 0040  CHOL 165  HDL 61  LDLCALC 86  TRIG 88  CHOLHDL 2.7   Thyroid Function Tests:  Recent Labs  11/24/16 0040 11/24/16 0737  TSH 0.148*  --   FREET4  --  0.87   Anemia Panel: No results for input(s): VITAMINB12, FOLATE, FERRITIN, TIBC, IRON, RETICCTPCT in the last 72 hours. Sepsis Labs: No results for input(s): PROCALCITON, LATICACIDVEN in the last 168 hours.  No results found for this or any previous visit (from the past 240 hour(s)).       Radiology Studies: Dg Chest 2 View  Result Date: 11/23/2016 CLINICAL DATA:  Chest pressure EXAM: CHEST  2 VIEW COMPARISON:  None. FINDINGS: Normal heart size. Atherosclerotic thoracic aorta. Otherwise normal mediastinal contour. No pneumothorax. No pleural effusion. No pulmonary edema. No acute consolidative airspace disease. Mild scarring versus atelectasis at the costophrenic angles. IMPRESSION: Mild scarring versus atelectasis at the costophrenic angles. Otherwise no active disease in the chest. Electronically Signed   By: Ilona Sorrel M.D.   On: 11/23/2016 15:00        Scheduled Meds: . atorvastatin  10 mg Oral q1800  . calcium carbonate  1,250 mg Oral BID WC  . cephALEXin  250 mg Oral QHS  . cholecalciferol  1,000 Units Oral Daily  . cycloSPORINE  1 drop Both Eyes BID  . diltiazem  120 mg Oral Daily  . DULoxetine  30 mg Oral BID  . losartan  100 mg Oral Daily  . multivitamin with minerals  1 tablet Oral Daily  . omega-3 acid ethyl esters  1 g Oral BID  . oxybutynin  10 mg Oral QHS  . pantoprazole  40 mg Oral Daily  . rivaroxaban  20 mg Oral Q supper  . thyroid  60 mg Oral QAC  breakfast  . vitamin C  250 mg Oral Daily   Continuous Infusions:   LOS: 0 days     Cordelia Poche, MD [facility] 11/24/2016, 2:41 PM Pager: [phone]  If 7PM-7AM, please contact night-coverage [email or web address] Password [facility] 11/24/2016, 2:41 PM

## 2016-11-24 NOTE — Progress Notes (Signed)
Progress Note  Patient Name: Cynthia [name] Date of Encounter: 11/24/2016  Primary Cardiologist:   New (Dr. Percival Spanish)  Subjective   No chest pain.  She went into atrial fib but did not feel this.    Inpatient Medications    Scheduled Meds: . calcium carbonate  1,250 mg Oral BID WC  . cephALEXin  250 mg Oral QHS  . cholecalciferol  1,000 Units Oral Daily  . cycloSPORINE  1 drop Both Eyes BID  . DULoxetine  30 mg Oral BID  . enoxaparin (LOVENOX) injection  40 mg Subcutaneous Q24H  . etodolac  300 mg Oral BID  . losartan  100 mg Oral Daily  . multivitamin with minerals  1 tablet Oral Daily  . omega-3 acid ethyl esters  1 g Oral BID  . oxybutynin  10 mg Oral QHS  . pantoprazole  40 mg Oral Daily  . simvastatin  20 mg Oral q1800  . thyroid  60 mg Oral QAC breakfast  . vitamin C  250 mg Oral Daily   Continuous Infusions:  PRN Meds: acetaminophen, morphine injection, ondansetron (ZOFRAN) IV, tiZANidine   Vital Signs    Vitals:   11/23/16 2345 11/24/16 0430 11/24/16 0500 11/24/16 0807  BP: (!) 147/69 (!) 166/77 (!) 143/119 (!) 153/91  Pulse: 67 66 85 (!) 101  Resp: 16 16    Temp: 98 F (36.7 C) 98.2 F (36.8 C)  98.3 F (36.8 C)  TempSrc: Oral Oral  Oral  SpO2: 94% 96% (!) 89% 95%  Weight: 148 lb 4.8 oz (67.3 kg)     Height: 5\' 5"  (1.651 m)       Intake/Output Summary (Last 24 hours) at 11/24/16 0925 Last data filed at 11/24/16 0116  Gross per 24 hour  Intake              120 ml  Output                0 ml  Net              120 ml   Filed Weights   11/23/16 1423 11/23/16 2345  Weight: 155 lb (70.3 kg) 148 lb 4.8 oz (67.3 kg)    Telemetry    Runs of atrial fib.  - Personally Reviewed  ECG    Atrial fib, rate 129, no acute ST T wave changes.  - Personally Reviewed  Physical Exam   GEN: No acute distress.   Neck: No  JVD Cardiac: RRR, no murmurs, rubs, or gallops.  Respiratory: Clear  to auscultation bilaterally. GI: Soft, nontender, non-distended   MS: No  edema; No deformity. Neuro:  Nonfocal  Psych: Normal affect   Labs    Chemistry Recent Labs Lab 11/23/16 1420  NA 140  K 3.5  CL 102  CO2 30  GLUCOSE 102*  BUN 19  CREATININE 0.71  CALCIUM 9.0  PROT 6.3*  ALBUMIN 3.7  AST 22  ALT 20  ALKPHOS 49  BILITOT 0.8  GFRNONAA >60  GFRAA >60  ANIONGAP 8     Hematology Recent Labs Lab 11/23/16 1420 11/24/16 0636  WBC 7.3 4.9  RBC 3.57* 3.92  HGB 10.9* 11.6*  HCT 32.6* 35.5*  MCV 91.3 90.6  MCH 30.5 29.6  MCHC 33.4 32.7  RDW 13.3 13.3  PLT 145* 159    Cardiac Enzymes Recent Labs Lab 11/23/16 2200 11/24/16 0040 11/24/16 0636  TROPONINI <0.03 <0.03 <0.03    Recent Labs Lab 11/23/16 1443  TROPIPOC 0.00     BNPNo results for input(s): BNP, PROBNP in the last 168 hours.   DDimer No results for input(s): DDIMER in the last 168 hours.   Radiology    Dg Chest 2 View  Result Date: 11/23/2016 CLINICAL DATA:  Chest pressure EXAM: CHEST  2 VIEW COMPARISON:  None. FINDINGS: Normal heart size. Atherosclerotic thoracic aorta. Otherwise normal mediastinal contour. No pneumothorax. No pleural effusion. No pulmonary edema. No acute consolidative airspace disease. Mild scarring versus atelectasis at the costophrenic angles. IMPRESSION: Mild scarring versus atelectasis at the costophrenic angles. Otherwise no active disease in the chest. Electronically Signed   By: Ilona Sorrel M.D.   On: 11/23/2016 15:00    Cardiac Studies   Pending  Patient Profile     76 y.o. female admitted with chest pain.  No prior cardiac history.  Developed atrial fib overnight.   Assessment & Plan    CHEST PAIN:   Plan POET (Plain Old Exercise Treadmill) today.  ATRIAL FIB:  This is new.  Cynthia [name] has a CHA2DS2 - VASc score of 3.   She will need anticoagulation.  Start Xarelto.  Start Cardizem after the POET (Plain Old Exercise Treadmill).  Can check echo as an out patient.    Follow up with me after the  hospitalization.     Signed, Minus Breeding, MD  11/24/2016, 9:25 AM

## 2016-11-24 NOTE — Progress Notes (Signed)
Was called to see the patient in the exercise lab following an exercise treadmill stress test. (DOD, as Dr. Percival Spanish is not available). She was noted to have poor exercise tolerance during the study, only completing about 1.5 minutes. She was notable short of breath, fatigued and reported chest pressure. I personally reviewed her exercise EKG. There do not appear to be acute ischemic changes, however, frequent exercise-related PVC's were noted along with short periods of atrial fibrillation. She had a hypotensive response to exercise. Based upon these findings, along with her symptoms of nocturnal chest pressure at home, radiating to her jaw and improvement with nitroglycerin, I'm recommending a left heart catheterization. We have discussed the risks, benefits and alternatives with her and she is agreeable to proceed. Please keep NPO and we will try to arrange this for today.  Pixie Casino, MD, [place]  Attending Cardiologist  Direct Dial: [phone]  Fax: [phone]  Website:  [email or web address]

## 2016-11-24 NOTE — Progress Notes (Signed)
Removed TR band from patient's right wrist. Site is a level 0. Gauze and tegaderm applied to the site. Pt instructed to not remove or get wet for 24 hours. Pt verbalized understanding

## 2016-11-24 NOTE — Progress Notes (Signed)
  Echocardiogram 2D Echocardiogram has been performed.  Tresa Res 11/24/2016, 1:54 PM

## 2016-11-24 NOTE — Care Management Note (Addendum)
Case Management Note  Patient Details  Name: Cynthia [name] MRN: [id number] Date of Birth: 08/26/1940  Subjective/Objective:   Pt presented for Chest Pain. Pt went into Atrial Fib- plan to d/c on Xarelto.                  Action/Plan: Benefits Check completed- CM will provide pt with 30 day free card. [facility] [address] [address] has medication in stock.  No other needs from CM at this time.   S/W [facility] @ [facility] # [phone]   XARELTO 20 MG DAILY    COVER- YES  CO-PAY- $ 75.00  TIER- 2 DRUG  PRIOR APPROVAL- NO  DEDUCTIBLE -NOT MET   RIVAROXABAN - NONE FORMULARY   PREFERRED PHARMACY : [facility] [place], [facility] AND [facility]  Expected Discharge Date:                  Expected Discharge Plan:  Home/Self Care  In-House Referral:  NA  Discharge planning Services  CM Consult, Medication Assistance  Post Acute Care Choice:  NA Choice offered to:  NA  DME Arranged:  N/A DME Agency:  NA  HH Arranged:  NA HH Agency:  NA  Status of Service:  Completed, signed off  If discussed at Long Length of Stay Meetings, dates discussed:    Additional Comments:  Graves-Bigelow,  Kaye, RN 11/24/2016, 12:26 PM  

## 2016-11-24 NOTE — Progress Notes (Signed)
Patient refused CPAP for tonight. Does wear at home but states she thinks she would be good tonight without one. I explained if she changed her mind to just have RN call and we could set her up anytime throughout the night. RN aware. Patient also told she may need supplemental O2 if sats fall and she doesn't want to wear CPAP, which she understood.

## 2016-11-24 NOTE — Care Management Obs Status (Signed)
[facility] NOTIFICATION   Patient Details  Name: CRIS GIBBY MRN: [id number] Date of Birth: 12/16/1940   Medicare Observation Status Notification Given:  Yes    Bethena Roys, RN 11/24/2016, 12:36 PM

## 2016-11-24 NOTE — H&P (View-Only) (Signed)
Was called to see the patient in the exercise lab following an exercise treadmill stress test. (DOD, as Dr. Percival Spanish is not available). She was noted to have poor exercise tolerance during the study, only completing about 1.5 minutes. She was notable short of breath, fatigued and reported chest pressure. I personally reviewed her exercise EKG. There do not appear to be acute ischemic changes, however, frequent exercise-related PVC's were noted along with short periods of atrial fibrillation. She had a hypotensive response to exercise. Based upon these findings, along with her symptoms of nocturnal chest pressure at home, radiating to her jaw and improvement with nitroglycerin, I'm recommending a left heart catheterization. We have discussed the risks, benefits and alternatives with her and she is agreeable to proceed. Please keep NPO and we will try to arrange this for today.  Pixie Casino, MD, [place]  Attending Cardiologist  Direct Dial: [phone]  Fax: [phone]  Website:  [email or web address]

## 2016-11-24 NOTE — Progress Notes (Signed)
Pt went into Afib, HR 110s- 140s. No active chest pain. On-call [facility] notified.

## 2016-11-24 NOTE — Evaluation (Signed)
Physical Therapy Evaluation Patient Details Name: Cynthia [name] MRN: [id number] DOB: 1941-02-11 Today's Date: 11/24/2016   History of Present Illness  Patient is a 76 y/o female who presents with chest pain. Plan for stress test 8/14. PMH includes HTN. hearing loss, ca.   Clinical Impression  Patient presents close to functional baseline and able to perform ADLs EOB and at sink reaching outside BoS without difficulty. Tolerated gait training with minor deficits in balance with head turns but no overt LOB. Pt independent from home, walks dogs and performs IADLs daily without difficulty. Has support of spouse at home. Encouraged ambulation while in hospital. HR elevated to 126 bpm but remained in NSR. Pt functioning close to baseline and does not require skilled therapy services. Discharge from therapy.    Follow Up Recommendations No PT follow up;Supervision - Intermittent    Equipment Recommendations  None recommended by PT    Recommendations for Other Services       Precautions / Restrictions Precautions Precautions: None Precaution Comments: watch HR Required Braces or Orthoses: Other Brace/Splint Other Brace/Splint: wears aircasts in shoes to walk Restrictions Weight Bearing Restrictions: No      Mobility  Bed Mobility Overal bed mobility: Modified Independent             General bed mobility comments: NO assist needed. No dizziness.  Transfers Overall transfer level: Modified independent               General transfer comment: Stood from EOB x1, from toilet x1. Increased time.  Ambulation/Gait Ambulation/Gait assistance: Supervision Ambulation Distance (Feet): 250 Feet Assistive device: None Gait Pattern/deviations: Step-through pattern;Decreased stride length;Drifts right/left Gait velocity: decreased Gait velocity interpretation: <1.8 ft/sec, indicative of risk for recurrent falls General Gait Details: Slow, steady gait with some instability with head  turns with no overt LOB. HR up to 127 bpm. NSR.  Stairs            Wheelchair Mobility    Modified Rankin (Stroke Patients Only)       Balance Overall balance assessment: Needs assistance Sitting-balance support: Feet supported;No upper extremity supported Sitting balance-Leahy Scale: Normal Sitting balance - Comments: Able to donn braces and shoes reaching outside BoS without difficulty.    Standing balance support: During functional activity Standing balance-Leahy Scale: Good Standing balance comment: Able to perform ADL tasks at sink brushing teeth and hair and going through bag without difficulty.                              Pertinent Vitals/Pain Pain Assessment: No/denies pain    Home Living Family/patient expects to be discharged to:: Private residence Living Arrangements: Spouse/significant other Available Help at Discharge: Family;Available PRN/intermittently Type of Home: House Home Access: Stairs to enter Entrance Stairs-Rails: None Entrance Stairs-Number of Steps: 2 Home Layout: One level Home Equipment: Cane - single point;Grab bars - tub/shower      Prior Function Level of Independence: Independent         Comments: Drives, cooks, cleans. Walks dogs daily.     Hand Dominance        Extremity/Trunk Assessment   Upper Extremity Assessment Upper Extremity Assessment: Defer to OT evaluation    Lower Extremity Assessment Lower Extremity Assessment: Overall WFL for tasks assessed       Communication   Communication: No difficulties  Cognition Arousal/Alertness: Awake/alert Behavior During Therapy: WFL for tasks assessed/performed Overall Cognitive Status: Within Functional Limits for  tasks assessed                                        General Comments      Exercises     Assessment/Plan    PT Assessment Patent does not need any further PT services  PT Problem List         PT Treatment  Interventions      PT Goals (Current goals can be found in the Care Plan section)  Acute Rehab PT Goals Patient Stated Goal: to go home PT Goal Formulation: All assessment and education complete, DC therapy    Frequency     Barriers to discharge        Co-evaluation               AM-PAC PT "6 Clicks" Daily Activity  Outcome Measure Difficulty turning over in bed (including adjusting bedclothes, sheets and blankets)?: None Difficulty moving from lying on back to sitting on the side of the bed? : None Difficulty sitting down on and standing up from a chair with arms (e.g., wheelchair, bedside commode, etc,.)?: None Help needed moving to and from a bed to chair (including a wheelchair)?: None Help needed walking in hospital room?: None Help needed climbing 3-5 steps with a railing? : None 6 Click Score: 24    End of Session Equipment Utilized During Treatment: Gait belt Activity Tolerance: Patient tolerated treatment well Patient left: in chair;with call bell/phone within reach Nurse Communication: Mobility status PT Visit Diagnosis: Unsteadiness on feet (R26.81);Difficulty in walking, not elsewhere classified (R26.2)    Time: 3235-5732 PT Time Calculation (min) (ACUTE ONLY): 28 min   Charges:   PT Evaluation $PT Eval Low Complexity: 1 Low PT Treatments $Gait Training: 8-22 mins   PT G Codes:   PT G-Codes **NOT FOR INPATIENT CLASS** Functional Assessment Tool Used: Clinical judgement Functional Limitation: Mobility: Walking and moving around Mobility: Walking and Moving Around Current Status (K0254): At least 1 percent but less than 20 percent impaired, limited or restricted Mobility: Walking and Moving Around Goal Status [phone]): At least 1 percent but less than 20 percent impaired, limited or restricted Mobility: Walking and Moving Around Discharge Status [phone]): At least 1 percent but less than 20 percent impaired, limited or restricted    [place],  [place], [place] [phone]    Lacie Draft 11/24/2016, 9:47 AM

## 2016-11-24 NOTE — Interval H&P Note (Signed)
Cath Lab Visit (complete for each Cath Lab visit)  Clinical Evaluation Leading to the Procedure:   ACS: Yes.    Non-ACS:    Anginal Classification: CCS IV  Anti-ischemic medical therapy: Minimal Therapy (1 class of medications)  Non-Invasive Test Results: No non-invasive testing performed  Prior CABG: No previous CABG      History and Physical Interval Note:  11/24/2016 4:01 PM  Cynthia [name]  has presented today for surgery, with the diagnosis of cp  The various methods of treatment have been discussed with the patient and family. After consideration of risks, benefits and other options for treatment, the patient has consented to  Procedure(s): LEFT HEART CATH AND CORONARY ANGIOGRAPHY (N/A) as a surgical intervention .  The patient's history has been reviewed, patient examined, no change in status, stable for surgery.  I have reviewed the patient's chart and labs.  Questions were answered to the patient's satisfaction.     Larae Grooms

## 2016-11-24 NOTE — Progress Notes (Signed)
Pt presented for exercise treadmill stress test (POET). She was only able to exercise for 32min 24 sec. The test was stopped prematurely due to chest pressure and fatigue. She also reported nausea, shortness of breath, and leg fatigue. She became hypotensive sBP 62. She recovered with frequent PVCs and questionable Afib. Echocardiogram with normal EF and grade 1 DD.  Discussed case with DOD Dr. Debara Pickett and the decision was made to perform left heart catheterization. Will discuss r/b with patient.    Tami Lin Sheamus Hasting, PA-C 11/24/2016, 3:10 PM [place]

## 2016-11-25 ENCOUNTER — Encounter (HOSPITAL_COMMUNITY): Payer: Self-pay | Admitting: Interventional Cardiology

## 2016-11-25 DIAGNOSIS — I10 Essential (primary) hypertension: Secondary | ICD-10-CM

## 2016-11-25 LAB — HEMOGLOBIN A1C
HEMOGLOBIN A1C: 5.6 % (ref 4.8–5.6)
MEAN PLASMA GLUCOSE: 114 mg/dL

## 2016-11-25 LAB — T3, FREE: T3, Free: 2.6 pg/mL (ref 2.0–4.4)

## 2016-11-25 NOTE — Progress Notes (Addendum)
Progress Note  Patient Name: [facility] [place] Date of Encounter: 11/25/2016  Primary Cardiologist: Dr. Percival Spanish  Subjective   Feels well.  No palpitations.    Inpatient Medications    Scheduled Meds: . atorvastatin  10 mg Oral q1800  . calcium carbonate  1,250 mg Oral BID WC  . cephALEXin  250 mg Oral QHS  . cholecalciferol  1,000 Units Oral Daily  . cycloSPORINE  1 drop Both Eyes BID  . diltiazem  120 mg Oral Daily  . DULoxetine  30 mg Oral BID  . losartan  100 mg Oral Daily  . multivitamin with minerals  1 tablet Oral Daily  . omega-3 acid ethyl esters  1 g Oral BID  . oxybutynin  10 mg Oral QHS  . pantoprazole  40 mg Oral Daily  . rivaroxaban  20 mg Oral Q supper  . sodium chloride flush  3 mL Intravenous Q12H  . thyroid  60 mg Oral QAC breakfast  . vitamin C  250 mg Oral Daily   Continuous Infusions: . sodium chloride     PRN Meds: sodium chloride, acetaminophen, morphine injection, ondansetron (ZOFRAN) IV, sodium chloride flush, tiZANidine   Vital Signs    Vitals:   11/24/16 2004 11/24/16 2336 11/25/16 0455 11/25/16 0803  BP: 137/69 137/64 137/90 (!) 158/80  Pulse: 69 74 69 66  Resp: 16 17 16 18   Temp: 98.2 F (36.8 C) 98.2 F (36.8 C) 97.7 F (36.5 C) 97.8 F (36.6 C)  TempSrc: Oral Oral Oral Oral  SpO2: 96% 95% 94% 93%  Weight:   145 lb 11.2 oz (66.1 kg)   Height:        Intake/Output Summary (Last 24 hours) at 11/25/16 1026 Last data filed at 11/25/16 0900  Gross per 24 hour  Intake              480 ml  Output                0 ml  Net              480 ml   Filed Weights   11/23/16 1423 11/23/16 2345 11/25/16 0455  Weight: 155 lb (70.3 kg) 148 lb 4.8 oz (67.3 kg) 145 lb 11.2 oz (66.1 kg)    Telemetry    NSR - Personally Reviewed, no pauses  ECG    AFib with RVR on 8/14 - Personally Reviewed  Physical Exam   GEN: No acute distress.   Neck: No JVD Cardiac: RRR, no murmurs, rubs, or gallops.  Respiratory: Clear to auscultation  bilaterally. GI: Soft, nontender, non-distended  MS: No edema; No deformity. No right radial hematoma.  2+ right radial pulse Neuro:  Nonfocal  Psych: Normal affect   Labs    Chemistry Recent Labs Lab 11/23/16 1420  NA 140  K 3.5  CL 102  CO2 30  GLUCOSE 102*  BUN 19  CREATININE 0.71  CALCIUM 9.0  PROT 6.3*  ALBUMIN 3.7  AST 22  ALT 20  ALKPHOS 49  BILITOT 0.8  GFRNONAA >60  GFRAA >60  ANIONGAP 8     Hematology Recent Labs Lab 11/23/16 1420 11/24/16 0636  WBC 7.3 4.9  RBC 3.57* 3.92  HGB 10.9* 11.6*  HCT 32.6* 35.5*  MCV 91.3 90.6  MCH 30.5 29.6  MCHC 33.4 32.7  RDW 13.3 13.3  PLT 145* 159    Cardiac Enzymes Recent Labs Lab 11/23/16 2200 11/24/16 0040 11/24/16 0636 11/24/16 1905  TROPONINI <  0.03 <0.03 <0.03 <0.03    Recent Labs Lab 11/23/16 1443  TROPIPOC 0.00     BNPNo results for input(s): BNP, PROBNP in the last 168 hours.   DDimer No results for input(s): DDIMER in the last 168 hours.   Radiology    Dg Chest 2 View  Result Date: 11/23/2016 CLINICAL DATA:  Chest pressure EXAM: CHEST  2 VIEW COMPARISON:  None. FINDINGS: Normal heart size. Atherosclerotic thoracic aorta. Otherwise normal mediastinal contour. No pneumothorax. No pleural effusion. No pulmonary edema. No acute consolidative airspace disease. Mild scarring versus atelectasis at the costophrenic angles. IMPRESSION: Mild scarring versus atelectasis at the costophrenic angles. Otherwise no active disease in the chest. Electronically Signed   By: Ilona Sorrel M.D.   On: 11/23/2016 15:00    Cardiac Studies   Cath showed no signifcant CAD.  Patient Profile     76 y.o. female with new onset AFib  Assessment & Plan    1) AFib: Maintaining NSR.  COntinue Diltiazem CD 120 mg daily.  Xarelto to start today for stroke prevention.  WOuld give her a dose of Xarelto prior to discharge if she is going home today.  Stressed importance of taking this with food to help absorption.    If  she has a recurrence of AFib, could consider Flecainide for rhythm control since she has no CAD.  She would need f/u ETT if flecainide started.    2) Would stop etodolac as an outpatient given starting Xarelto.  She can try acetaminophen for pain control.  COntinue PPI.  3) HTN: Borderline Controlled. No low readings noted. If BP drops with diltiazem, could decrease losartan dose.  Will arrange f/u with Dr. Percival Spanish.    OK to discharge from a cardiac standpoint.  Signed, Larae Grooms, MD  11/25/2016, 10:26 AM

## 2016-11-25 NOTE — Discharge Instructions (Signed)

## 2016-11-25 NOTE — Consult Note (Signed)
           [facility] CM Primary Care Navigator  11/25/2016  Cynthia [name] 28-Nov-1940 [id number]   Went to seepatient at the bedsideto identify possible discharge needs but she was already discharged per staff report.  Patient was discharged home today.  Primary care provider's officeis listed as doing transition of care.  Patient has discharge instruction to follow-up with primary care provider in a week.   For questions, please contact:  Dannielle Huh, BSN, RN- [facility] Primary Care Navigator  Telephone: [phone] [place]

## 2016-11-25 NOTE — Discharge Summary (Signed)
Physician Discharge Summary  Cynthia [name] [place] JOI:[id number] DOB: 01/25/1941 DOA: 11/23/2016  PCP: Deland Pretty, MD  Admit date: 11/23/2016 Discharge date: 11/25/2016  Time spent: 35 minutes  Recommendations for Outpatient Follow-up:  1. Patient will need to continue Xarelto ongoing 20 mg daily for atrial fibrillation started this admission 2. Get a TSH in about one month 3. Needs complete metabolic panel 1 month 4. Patient will need bmet and CBC in about one week at PCP office  Discharge Diagnoses:  Principal Problem:   Chest pain Active Problems:   Sinusitis, chronic   OSA (obstructive sleep apnea)   Essential hypertension, benign   Dry eyes   Hyperlipidemia   GERD (gastroesophageal reflux disease)   History of recurrent UTIs   Abnormal stress test   Discharge Condition: improved  Diet recommendation:  hh low salt  Filed Weights   11/23/16 1423 11/23/16 2345 11/25/16 0455  Weight: 70.3 kg (155 lb) 67.3 kg (148 lb 4.8 oz) 66.1 kg (145 lb 11.2 oz)    History of present illness:  74 female HTN HLD Hypothyroidism Reflux Chronic right ankle pain secondary to posterior tibialis insufficiency followed orthopedics/sports medicine Chronic headaches followed by Dr. Felecia Shelling Obstructive sleep apnea on CPAP ? Sjogren's syndrome--labs were none suggestive of the same at neurology office Prior colonic polyps with internal/external hemorrhoids diverticuli and one polyp noted 08/2010  Patient was admitted with chest pain 8/13 and underwent cardiac cath See below  Hospital Course:  Likely noncardiogenic chest pain Patient had a cardiac cath as below that did not show any significant stenosis Etiology of the chest pain is unknown  Atrial fibrillation [place] score >3 New onset this admission Continue Xarelto with starter pack-have obtained starter pack for the same from care management Cardiology to determine if a candidate for flecainide as an outpatient Discharge rate control  agent Cardizem CD 120 daily  HTN Continue losartan 100 daily See above discussion  Chronic headaches Follow-up with neurologist  Hyperlipidemia Note dosage change in agent changed to atorvastatin  Reflux continue Protonix and Tums  Recurrent UTIs- Overactive bladder On chronic suppressive therapy which will need to continue-Keflex 250 at bedtime Continue oxybutynin  Bipolar Continue Cymbalta 30 mg twice a day Monitor QTC as an outpatient at cardiology office     Procedures:  Cardiac cath 8/15 had a Conclusion of    The left ventricular systolic function is normal.  LV end diastolic pressure is low.  The left ventricular ejection fraction is 55-65% by visual estimate.  There is no aortic valve stenosis.  Tortuous LAD. No significant atherosclerosis.    Consultations:  cardiology  Discharge Exam: Vitals:   11/25/16 0803 11/25/16 1150  BP: (!) 158/80 (!) 146/86  Pulse: 66 68  Resp: 18 18  Temp: 97.8 F (36.6 C) 98.2 F (36.8 C)  SpO2: 93% 95%    General: alert pleasant coherent In nad No cp  Cardiovascular: s1 s 2no m/r/g Respiratory: clear no added sound abd soft nt nd no rebound  Discharge Instructions    Current Discharge Medication List    START taking these medications   Details  atorvastatin (LIPITOR) 10 MG tablet Take 1 tablet (10 mg total) by mouth daily at 6 PM. Qty: 30 tablet, Refills: 0    diltiazem (CARDIZEM CD) 120 MG 24 hr capsule Take 1 capsule (120 mg total) by mouth daily. Qty: 30 capsule, Refills: 0    rivaroxaban (XARELTO) 20 MG TABS tablet Take 1 tablet (20 mg total) by mouth daily  with supper. Qty: 30 tablet, Refills: 0      CONTINUE these medications which have NOT CHANGED   Details  Alpha-Lipoic Acid (LIPOIC ACID PO) Take 2 tablets by mouth 2 (two) times daily.    calcium carbonate 1250 MG capsule Take 1,250 mg by mouth 2 (two) times daily with a meal. Reported on 07/09/2015    cephALEXin (KEFLEX) 250 MG  capsule Take 250 mg by mouth at bedtime.     cholecalciferol (VITAMIN D) 1000 UNITS tablet Take 1,000 Units by mouth daily.    cycloSPORINE (RESTASIS) 0.05 % ophthalmic emulsion Place 1 drop into both eyes 2 (two) times daily.    DULoxetine (CYMBALTA) 30 MG capsule TAKE 1 CAPSULE (30 MG TOTAL) BY MOUTH 2 (TWO) TIMES DAILY. Qty: 60 capsule, Refills: 5    ipratropium (ATROVENT) 0.06 % nasal spray 2 DROPS IN EACH NOSTRIL DAILY AT BEDTIME Refills: 1    losartan (COZAAR) 100 MG tablet Take 100 mg by mouth daily.     Meth-Hyo-M Bl-Na Phos-Ph Sal (URIBEL) 118 MG CAPS Take 1 capsule by mouth 2 (two) times daily as needed (uti).  Refills: 3    Multiple Vitamin (MULTIVITAMIN WITH MINERALS) TABS tablet Take 1 tablet by mouth daily.    omega-3 acid ethyl esters (LOVAZA) 1 G capsule Take 1 g by mouth 2 (two) times daily.    oxybutynin (DITROPAN-XL) 10 MG 24 hr tablet Take 10 mg by mouth at bedtime.  Refills: 11    pantoprazole (PROTONIX) 40 MG tablet Take 40 mg by mouth daily. Refills: 1    thyroid (ARMOUR) 60 MG tablet Take 60 mg by mouth daily before breakfast.    tiZANidine (ZANAFLEX) 4 MG tablet TAKE 1 TABLET BY MOUTH 3 TIMES A DAY AS NEEDED Qty: 90 tablet, Refills: 1    triamcinolone cream (KENALOG) 0.1 % 1 APPLICATION APPLY ON THE SKIN DAILY AS NEEDED Refills: 2    vitamin C (ASCORBIC ACID) 250 MG tablet Take 250 mg by mouth daily.      STOP taking these medications     etodolac (LODINE) 300 MG capsule      simvastatin (ZOCOR) 20 MG tablet        No Known Allergies    The results of significant diagnostics from this hospitalization (including imaging, microbiology, ancillary and laboratory) are listed below for reference.    Significant Diagnostic Studies: Dg Chest 2 View  Result Date: 11/23/2016 CLINICAL DATA:  Chest pressure EXAM: CHEST  2 VIEW COMPARISON:  None. FINDINGS: Normal heart size. Atherosclerotic thoracic aorta. Otherwise normal mediastinal contour. No  pneumothorax. No pleural effusion. No pulmonary edema. No acute consolidative airspace disease. Mild scarring versus atelectasis at the costophrenic angles. IMPRESSION: Mild scarring versus atelectasis at the costophrenic angles. Otherwise no active disease in the chest. Electronically Signed   By: Ilona Sorrel M.D.   On: 11/23/2016 15:00    Microbiology: No results found for this or any previous visit (from the past 240 hour(s)).   Labs: Basic Metabolic Panel:  Recent Labs Lab 11/23/16 1420  NA 140  K 3.5  CL 102  CO2 30  GLUCOSE 102*  BUN 19  CREATININE 0.71  CALCIUM 9.0   Liver Function Tests:  Recent Labs Lab 11/23/16 1420  AST 22  ALT 20  ALKPHOS 49  BILITOT 0.8  PROT 6.3*  ALBUMIN 3.7   No results for input(s): LIPASE, AMYLASE in the last 168 hours. No results for input(s): AMMONIA in the last 168 hours. CBC:  Recent Labs Lab 11/23/16 1420 11/24/16 0636  WBC 7.3 4.9  HGB 10.9* 11.6*  HCT 32.6* 35.5*  MCV 91.3 90.6  PLT 145* 159   Cardiac Enzymes:  Recent Labs Lab 11/23/16 2200 11/24/16 0040 11/24/16 0636 11/24/16 1905  TROPONINI <0.03 <0.03 <0.03 <0.03   BNP: BNP (last 3 results) No results for input(s): BNP in the last 8760 hours.  ProBNP (last 3 results) No results for input(s): PROBNP in the last 8760 hours.  CBG: No results for input(s): GLUCAP in the last 168 hours.     SignedNita Sells MD   [facility] 11/25/2016, 12:04 PM

## 2016-11-27 ENCOUNTER — Telehealth: Payer: Self-pay | Admitting: Physician Assistant

## 2016-11-27 NOTE — Telephone Encounter (Signed)
Patient was discharged on new 120 mg cardizem daily for Afib. She states that she ran errands today but kept getting dizzy throughout the day and felt "woozy in the head." She hydrated with water and watermelon. I advised her to take her BP tomorrow morning and to take her blood pressure. I advised her that if her pressure was above 150 to take half (50 mg) of her losartan. She takes her cardizem at night. I advised her to take her pressure this evening and if it is over 110, then take her cardizem.   She expressed understanding of the plan.   Tami Lin Nuriyah Hanline, PA-C 11/27/2016, 6:31 PM [place]

## 2016-11-30 NOTE — Progress Notes (Signed)
HPI: FU atrial fibrillation. Patient was recently hospitalized with chest pain. Echocardiogram August 2018 showed normal LV systolic function, grade 1 diastolic dysfunction. Cardiac catheterization August 2018 showed normal LV function, no significant coronary disease and low left ventricular end-diastolic pressure. TSH was low at 0.148. Free T4 and free T3 normal. Patient was noted to have atrial fibrillation during the hospitalization and converted to sinus rhythm. Since last seen she denies dyspnea, exertional chest pain, palpitations or syncope. Some dizziness with standing.  Current Outpatient Prescriptions  Medication Sig Dispense Refill  . Alpha-Lipoic Acid (LIPOIC ACID PO) Take 2 tablets by mouth 2 (two) times daily.    Marland Kitchen atorvastatin (LIPITOR) 10 MG tablet Take 1 tablet (10 mg total) by mouth daily at 6 PM. 30 tablet 0  . calcium carbonate 1250 MG capsule Take 1,250 mg by mouth 2 (two) times daily with a meal. Reported on 07/09/2015    . cephALEXin (KEFLEX) 250 MG capsule Take 250 mg by mouth at bedtime.     . cholecalciferol (VITAMIN D) 1000 UNITS tablet Take 1,000 Units by mouth daily.    . cycloSPORINE (RESTASIS) 0.05 % ophthalmic emulsion Place 1 drop into both eyes 2 (two) times daily.    Marland Kitchen diltiazem (CARDIZEM CD) 120 MG 24 hr capsule Take 1 capsule (120 mg total) by mouth daily. 30 capsule 0  . DULoxetine (CYMBALTA) 30 MG capsule TAKE 1 CAPSULE (30 MG TOTAL) BY MOUTH 2 (TWO) TIMES DAILY. 60 capsule 5  . ipratropium (ATROVENT) 0.06 % nasal spray 2 DROPS IN EACH NOSTRIL DAILY AT BEDTIME  1  . losartan (COZAAR) 100 MG tablet Take 100 mg by mouth daily.     . Meth-Hyo-M Bl-Na Phos-Ph Sal (URIBEL) 118 MG CAPS Take 1 capsule by mouth 2 (two) times daily as needed (uti).   3  . Multiple Vitamin (MULTIVITAMIN WITH MINERALS) TABS tablet Take 1 tablet by mouth daily.    Marland Kitchen omega-3 acid ethyl esters (LOVAZA) 1 G capsule Take 1 g by mouth 2 (two) times daily.    Marland Kitchen oxybutynin (DITROPAN-XL)  10 MG 24 hr tablet Take 10 mg by mouth at bedtime.   11  . pantoprazole (PROTONIX) 40 MG tablet Take 40 mg by mouth daily.  1  . rivaroxaban (XARELTO) 20 MG TABS tablet Take 1 tablet (20 mg total) by mouth daily with supper. 30 tablet 0  . thyroid (ARMOUR) 60 MG tablet Take 60 mg by mouth daily before breakfast.    . tiZANidine (ZANAFLEX) 4 MG tablet TAKE 1 TABLET BY MOUTH 3 TIMES A DAY AS NEEDED (Patient taking differently: TAKE 1 TABLET BY MOUTH TWICE DAILY) 90 tablet 1  . triamcinolone cream (KENALOG) 0.1 % 1 APPLICATION APPLY ON THE SKIN DAILY AS NEEDED  2  . vitamin C (ASCORBIC ACID) 250 MG tablet Take 250 mg by mouth daily.     No current facility-administered medications for this visit.      Past Medical History:  Diagnosis Date  . Cancer ([place])    Melanoma  . Hearing loss   . Hypertension     Past Surgical History:  Procedure Laterality Date  . APPENDECTOMY    . CRANIECTOMY FOR EXCISION OF ACOUSTIC NEUROMA    . LEFT HEART CATH AND CORONARY ANGIOGRAPHY N/A 11/24/2016   Procedure: LEFT HEART CATH AND CORONARY ANGIOGRAPHY;  Surgeon: Jettie Booze, MD;  Location: [facility];  Service: Cardiovascular;  Laterality: N/A;  . PARTIAL HYSTERECTOMY    .  TONSILLECTOMY      Social History   Social History  . Marital status: Married    Spouse name: N/A  . Number of children: N/A  . Years of education: N/A   Occupational History  . Not on file.   Social History Main Topics  . Smoking status: Never Smoker  . Smokeless tobacco: Never Used  . Alcohol use Yes  . Drug use: No  . Sexual activity: Not on file   Other Topics Concern  . Not on file   Social History Narrative  . No narrative on file    Family History  Problem Relation Age of Onset  . Congestive Heart Failure Mother   . Heart disease Father        History of heart attacks at a later age.      ROS: no fevers or chills, productive cough, hemoptysis, dysphasia, odynophagia, melena, hematochezia,  dysuria, hematuria, rash, seizure activity, orthopnea, PND, pedal edema, claudication. Remaining systems are negative.  Physical Exam: Well-developed well-nourished in no acute distress.  Skin is warm and dry.  HEENT is normal.  Neck is supple.  Chest is clear to auscultation with normal expansion.  Cardiovascular exam is regular rate and rhythm.  Abdominal exam nontender or distended. No masses palpated. Extremities show no edema. neuro grossly intact  ECG- sinus rhythm at a rate of 70. Left axis deviation. Nonspecific ST changes. personally reviewed  A/P  1 paroxysmal atrial fibrillation-patient in sinus rhythm today. Continue Cardizem for rate control if atrial fibrillation recurs. Continue xarelto. We can consider an antiarrhythmic in the future if she has symptomatic recurrences.  2 low TSH-patient will need follow-up laboratories with primary care.  3 hypertension-blood pressure is borderline. She has some dizziness with standing. Decrease Cozaar to 25 mg daily and follow.  4 hyperlipidemia-continue present medications. Managed by primary care.  5 recent chest pain-cardiac catheterization revealed no coronary artery disease. Will not pursue further cardiac evaluation.  Kirk Ruths, MD

## 2016-12-01 ENCOUNTER — Ambulatory Visit (INDEPENDENT_AMBULATORY_CARE_PROVIDER_SITE_OTHER): Payer: Medicare Other | Admitting: Cardiology

## 2016-12-01 ENCOUNTER — Encounter: Payer: Self-pay | Admitting: Cardiology

## 2016-12-01 VITALS — BP 108/70 | HR 70 | Ht 65.0 in | Wt 150.0 lb

## 2016-12-01 DIAGNOSIS — I10 Essential (primary) hypertension: Secondary | ICD-10-CM

## 2016-12-01 DIAGNOSIS — E7800 Pure hypercholesterolemia, unspecified: Secondary | ICD-10-CM | POA: Diagnosis not present

## 2016-12-01 DIAGNOSIS — I480 Paroxysmal atrial fibrillation: Secondary | ICD-10-CM | POA: Diagnosis not present

## 2016-12-01 MED ORDER — LOSARTAN POTASSIUM 25 MG PO TABS: 25.0000 mg | ORAL_TABLET | Freq: Every day | ORAL | 3 refills | Status: DC

## 2016-12-01 NOTE — Patient Instructions (Signed)
Medication Instructions:   DECREASE LOSARTAN TO 25 MG ONCE DAILY  Follow-Up:  Your physician wants you to follow-up in: [place] will receive a reminder letter in the mail two months in advance. If you don't receive a letter, please call our office to schedule the follow-up appointment.   If you need a refill on your cardiac medications before your next appointment, please call your pharmacy.

## 2016-12-02 DIAGNOSIS — R3914 Feeling of incomplete bladder emptying: Secondary | ICD-10-CM | POA: Diagnosis not present

## 2016-12-02 DIAGNOSIS — N3020 Other chronic cystitis without hematuria: Secondary | ICD-10-CM | POA: Diagnosis not present

## 2016-12-02 DIAGNOSIS — N3946 Mixed incontinence: Secondary | ICD-10-CM | POA: Diagnosis not present

## 2016-12-03 ENCOUNTER — Telehealth: Payer: Self-pay | Admitting: Cardiology

## 2016-12-03 NOTE — Telephone Encounter (Signed)
Spoke with pt, this morning after taking all of her medications she suddenly became very tired and could not keep her eyes open. She went to bed for about 1 hour. After getting up she feels fine. Her bp this afternoon is 144/77 with pulse 64. Explained to patient would like to know what her bp is during the weakness episode. She will call if she has another episode. She denied any heart racing or other symptoms during the episode. Pt agreed with this plan.

## 2016-12-03 NOTE — Telephone Encounter (Signed)
New message       Talk to the nurse about an "episode" of extreme fatigue.

## 2016-12-04 ENCOUNTER — Telehealth: Payer: Self-pay | Admitting: Neurology

## 2016-12-04 ENCOUNTER — Telehealth: Payer: Self-pay | Admitting: Cardiology

## 2016-12-04 NOTE — Telephone Encounter (Signed)
I have spoken with Mr. Winegardner this afternoon.  He sts. when pt. takes both Losartan and Tizanidine in the morning, she is excessively drowsy.  I have explained Tizanidine rx. is tid, prn.  She may stop am dose to see if the drowsiness improves. Can continue pm or hs dose.  He verbalized understanding of same and will call back if they need anything further/fim

## 2016-12-04 NOTE — Telephone Encounter (Signed)
Left message for patient of dr crenshaw's recommendations. 

## 2016-12-04 NOTE — Addendum Note (Signed)
Addended by: Cristopher Estimable on: 12/04/2016 05:08 PM   Modules accepted: Orders

## 2016-12-04 NOTE — Telephone Encounter (Signed)
Dc cozaar and follow bp Kirk Ruths

## 2016-12-04 NOTE — Telephone Encounter (Signed)
Pt c/o BP issue: STAT if pt c/o blurred vision, one-sided weakness or slurred speech  1. What are your last 5 BP readings? Last night  142/77 heart rate of 64, this morning 89/58 heart rate 70 and 98/56 heart reate 71.  2. Are you having any other symptoms (ex. Dizziness, headache, blurred vision, passed out)? "Drunk feeling" like over-medicated   3. What is your BP issue? low

## 2016-12-04 NOTE — Telephone Encounter (Signed)
Patients husband called office in reference to patient being in hospital for Afib on 11/23/16.  Patient has followed up with Cardiologist.  Husband's concerned that patient taking tiZANidine (ZANAFLEX) 4 MG tablet has been effecting patient.  Patient has been becoming sleepy after her morning medications, when at the Cardiologist office they suggest patient to call our office to see if the medication is having an interaction with her other medications. Please contact husband as he has a lot of information about other medications.  Please call before we can close per husband.

## 2016-12-04 NOTE — Telephone Encounter (Signed)
Spoke with patient and her husband. Patient had an episode of vomiting and diarrhea last night. Woke up this am and felt good. She took her am medications and later became very tired. Blood pressure checked during this time 89/52 and 98/56. Patient does take Diltiazem in the evening and Losartan in am. Patient has not been monitoring blood pressure at home. She is taking Tizanidine 3 times a day. Discussed with Claiborne Billings D. Will have patient reduce Tizanidine to 1/2 tablet and contact prescribing physician as this can cause hypotension. Also hold Losartan if systolic blood pressure is below 100. Call back if continues to have issues. Advised husband, verbalized understanding.

## 2016-12-07 ENCOUNTER — Telehealth: Payer: Self-pay | Admitting: Cardiology

## 2016-12-07 NOTE — Telephone Encounter (Signed)
Spoke with Cynthia [name], aware of the new dx of atrial fib and the patient is taking xarelto. They will not use any epi in the anesthesia if needed. They are aware she will bleed more than usual due to xarelto.

## 2016-12-07 NOTE — Telephone Encounter (Signed)
NEw message  Debra from Dr. Darrol Jump  Dental office call requesting to speak with RN. She states she is having bonding completed today. And she does not know if that is require to be a surgical clearance. Please call back to discuss

## 2016-12-10 ENCOUNTER — Ambulatory Visit: Payer: PRIVATE HEALTH INSURANCE | Admitting: Cardiology

## 2016-12-15 ENCOUNTER — Telehealth: Payer: Self-pay | Admitting: Cardiology

## 2016-12-15 NOTE — Telephone Encounter (Signed)
Left message for pt to call.

## 2016-12-15 NOTE — Telephone Encounter (Signed)
New message     Pt c/o medication issue:  1. Name of Medication:  atorvastatin (LIPITOR) 10 MG tablet Take 1 tablet (10 mg total) by mouth daily at 6 PM.   diltiazem (CARDIZEM CD) 120 MG 24 hr capsule Take 1 capsule (120 mg total) by mouth daily.   rivaroxaban (XARELTO) 20 MG TABS tablet Take 1 tablet (20 mg total) by mouth daily with supper.     2. How are you currently taking this medication (dosage and times per day)?  See above   3. Are you having a reaction (difficulty breathing--STAT)? Not sure  4. What is your medication issue? Pt states she was light headed and zombie like when she first started taking them, she is also having a little tightness in chest , and gets tired very easily    [facility] [address]

## 2016-12-15 NOTE — Congregational Nurse Program (Signed)
Congregational Nurse Program Note  Date of Encounter: 12/15/2016  Past Medical History: Past Medical History:  Diagnosis Date  . Cancer ([place])    Melanoma  . Hearing loss   . Hypertension     Encounter Details:     CNP Questionnaire - 12/15/16 1534      Patient Demographics   Is this a new or existing patient? Existing   Patient is considered a/an Not Applicable   Race Caucasian/White     Patient Assistance   Location of Patient Assistance Not Applicable   Patient's financial/insurance status Medicare   Uninsured Patient ([place] [employer]s) No   Patient referred to apply for the following financial assistance Not Applicable   Food insecurities addressed Not Applicable   Transportation assistance No   Assistance securing medications No   Educational health offerings Exercise/physical activity     Encounter Details   Primary purpose of visit Education/Health Concerns   Was an Emergency Department visit averted? Not Applicable   Does patient have a medical provider? Yes   Patient referred to Not Applicable   Was a mental health screening completed? (GAINS tool) No   Does patient have dental issues? No   Does patient have vision issues? No   Does your patient have an abnormal blood pressure today? No   Since previous encounter, have you referred patient for abnormal blood pressure that resulted in a new diagnosis or medication change? No   Does your patient have an abnormal blood glucose today? No   Since previous encounter, have you referred patient for abnormal blood glucose that resulted in a new diagnosis or medication change? No   Was there a life-saving intervention made? No    Attended chair yoga class.

## 2016-12-16 ENCOUNTER — Telehealth: Payer: Self-pay | Admitting: Cardiology

## 2016-12-16 ENCOUNTER — Other Ambulatory Visit: Payer: Self-pay | Admitting: Cardiology

## 2016-12-16 MED ORDER — DILTIAZEM HCL ER COATED BEADS 120 MG PO CP24: 120.0000 mg | ORAL_CAPSULE | Freq: Every day | ORAL | 12 refills | Status: DC

## 2016-12-16 MED ORDER — ATORVASTATIN CALCIUM 10 MG PO TABS: ORAL_TABLET | ORAL | 12 refills | Status: DC

## 2016-12-16 MED ORDER — RIVAROXABAN 20 MG PO TABS: ORAL_TABLET | ORAL | 12 refills | Status: DC

## 2016-12-16 NOTE — Telephone Encounter (Signed)
°*  STAT* If patient is at the pharmacy, call can be transferred to refill team.   1. Which medications need to be refilled? (please list name of each medication and dose if known) Atorvastatin , Xarelto, and Dilitiazem ( Needs a new prescription Sent for each one )   2. Which pharmacy/location (including street and city if local pharmacy) is medication [facility] sent to?[facility] [address]   3. Do they need a 30 day or 90 day supply? [place]

## 2016-12-16 NOTE — Telephone Encounter (Signed)
Left message for pt to call.

## 2016-12-16 NOTE — Telephone Encounter (Signed)
Refill sent to the pharmacy electronically. Left message for pt to call if continues to have problems. See previous telephone note.

## 2016-12-22 ENCOUNTER — Emergency Department (HOSPITAL_COMMUNITY): Payer: Medicare Other

## 2016-12-22 ENCOUNTER — Encounter (HOSPITAL_COMMUNITY): Payer: Self-pay | Admitting: *Deleted

## 2016-12-22 ENCOUNTER — Emergency Department (HOSPITAL_COMMUNITY)
Admission: EM | Admit: 2016-12-22 | Discharge: 2016-12-22 | Disposition: A | Discharge status: Home / Self Care | Payer: Medicare Other | Attending: Emergency Medicine | Admitting: Emergency Medicine

## 2016-12-22 DIAGNOSIS — I10 Essential (primary) hypertension: Secondary | ICD-10-CM | POA: Insufficient documentation

## 2016-12-22 DIAGNOSIS — R079 Chest pain, unspecified: Secondary | ICD-10-CM | POA: Diagnosis not present

## 2016-12-22 DIAGNOSIS — R1011 Right upper quadrant pain: Secondary | ICD-10-CM | POA: Insufficient documentation

## 2016-12-22 DIAGNOSIS — Z79899 Other long term (current) drug therapy: Secondary | ICD-10-CM | POA: Diagnosis not present

## 2016-12-22 DIAGNOSIS — E785 Hyperlipidemia, unspecified: Secondary | ICD-10-CM | POA: Insufficient documentation

## 2016-12-22 DIAGNOSIS — N3000 Acute cystitis without hematuria: Secondary | ICD-10-CM | POA: Insufficient documentation

## 2016-12-22 DIAGNOSIS — Z7901 Long term (current) use of anticoagulants: Secondary | ICD-10-CM | POA: Insufficient documentation

## 2016-12-22 DIAGNOSIS — R9431 Abnormal electrocardiogram [ECG] [EKG]: Secondary | ICD-10-CM | POA: Diagnosis not present

## 2016-12-22 LAB — I-STAT TROPONIN, ED
TROPONIN I, POC: 0 ng/mL (ref 0.00–0.08)
Troponin i, poc: 0 ng/mL (ref 0.00–0.08)

## 2016-12-22 LAB — CBC
HCT: 33.9 % — ABNORMAL LOW (ref 36.0–46.0)
Hemoglobin: 11.2 g/dL — ABNORMAL LOW (ref 12.0–15.0)
MCH: 30.7 pg (ref 26.0–34.0)
MCHC: 33 g/dL (ref 30.0–36.0)
MCV: 92.9 fL (ref 78.0–100.0)
Platelets: 154 10*3/uL (ref 150–400)
RBC: 3.65 MIL/uL — ABNORMAL LOW (ref 3.87–5.11)
RDW: 13.3 % (ref 11.5–15.5)
WBC: 5 10*3/uL (ref 4.0–10.5)

## 2016-12-22 LAB — URINALYSIS, ROUTINE W REFLEX MICROSCOPIC
BILIRUBIN URINE: NEGATIVE
GLUCOSE, UA: NEGATIVE mg/dL
HGB URINE DIPSTICK: NEGATIVE
KETONES UR: NEGATIVE mg/dL
NITRITE: NEGATIVE
PROTEIN: NEGATIVE mg/dL
Specific Gravity, Urine: 1.008 (ref 1.005–1.030)
pH: 7 (ref 5.0–8.0)

## 2016-12-22 LAB — HEPATIC FUNCTION PANEL
ALBUMIN: 3.8 g/dL (ref 3.5–5.0)
ALT: 20 U/L (ref 14–54)
AST: 26 U/L (ref 15–41)
Alkaline Phosphatase: 45 U/L (ref 38–126)
BILIRUBIN TOTAL: 0.5 mg/dL (ref 0.3–1.2)
Bilirubin, Direct: <0.1 mg/dL — ABNORMAL LOW (ref 0.1–0.5)
TOTAL PROTEIN: 6.5 g/dL (ref 6.5–8.1)

## 2016-12-22 LAB — BASIC METABOLIC PANEL
Anion gap: 6 (ref 5–15)
BUN: 15 mg/dL (ref 6–20)
CO2: 27 mmol/L (ref 22–32)
Calcium: 9.2 mg/dL (ref 8.9–10.3)
Chloride: 106 mmol/L (ref 101–111)
Creatinine, Ser: 0.66 mg/dL (ref 0.44–1.00)
GFR calc Af Amer: >60 mL/min (ref >60)
GFR calc non Af Amer: >60 mL/min (ref >60)
GLUCOSE: 105 mg/dL — AB (ref 65–99)
Potassium: 3.6 mmol/L (ref 3.5–5.1)
SODIUM: 139 mmol/L (ref 135–145)

## 2016-12-22 LAB — LIPASE, BLOOD: Lipase: 28 U/L (ref 11–51)

## 2016-12-22 MED ORDER — RANITIDINE HCL 150 MG PO TABS
150.0000 mg | ORAL_TABLET | Freq: Two times a day (BID) | ORAL | 0 refills | Status: DC
Start: 2016-12-22 — End: 2018-05-31

## 2016-12-22 MED ORDER — LEVOFLOXACIN 750 MG PO TABS: 750.0000 mg | ORAL_TABLET | Freq: Every day | ORAL | 0 refills | Status: AC

## 2016-12-22 MED ORDER — LEVOFLOXACIN 750 MG PO TABS
750.0000 mg | ORAL_TABLET | Freq: Once | ORAL | Status: AC
  Administered 2016-12-22: 750 mg via ORAL
  Filled 2016-12-22: qty 1

## 2016-12-22 MED ORDER — GI COCKTAIL ~~LOC~~
30.0000 mL | Freq: Once | ORAL | Status: AC
  Administered 2016-12-22: 30 mL via ORAL
  Filled 2016-12-22: qty 30

## 2016-12-22 MED ORDER — SUCRALFATE 1 G PO TABS: 1.0000 g | ORAL_TABLET | Freq: Three times a day (TID) | ORAL | 0 refills | Status: DC

## 2016-12-22 NOTE — ED Notes (Signed)
Patient transported to Ultrasound 

## 2016-12-22 NOTE — ED Notes (Signed)
C/o pain under right breast onset 5am states she took 2 Tums with slight relief. C/o slight heaviness on the right side. At present.,

## 2016-12-22 NOTE — ED Triage Notes (Signed)
Pt was woken from sleep this morning with chest pain radiating under R breast; hx of afib and is on xarelto

## 2016-12-22 NOTE — ED Provider Notes (Signed)
[facility] DEPT Provider Note   CSN: [id number] Arrival date & time: 12/22/16  0541     History   Chief Complaint Chief Complaint  Patient presents with  . Chest Pain    HPI Cynthia [name] is a 76 y.o. female.  HPI   76 yo F with h/o AFib, GERD, HTN, recent admission for CP s/p normal cath who presents with right-sided chest pressure. Pt was well last night. She reports awakening at around 5 Am with dull, aching, right-sided substernal/subcostal chest pressure. Pressure worse with deep breaths, palpation. She took some tums without improvement. No alleviating factors. No shortness of breath. Pain feels somewhat similar to the pain she just had several weeks ago, when she was admitted. No h/o gallstones. She has not tried to eat this morning. Has not taken her meds. Pain does seem to worsen lying flat.  Past Medical History:  Diagnosis Date  . A-fib ([place])   . Cancer ([place])    Melanoma  . Hearing loss   . Hypertension     Patient Active Problem List   Diagnosis Date Noted  . Abnormal stress test   . Chest pain 11/23/2016  . Hyperlipidemia 11/23/2016  . GERD (gastroesophageal reflux disease) 11/23/2016  . History of recurrent UTIs 11/23/2016  . Polyneuropathy 10/30/2016  . Subacromial bursitis of left shoulder joint 10/30/2016  . Numbness 10/30/2016  . Arthritis, senescent 01/03/2016  . Left knee pain 01/03/2016  . Pain in joint, ankle and foot 02/28/2015  . Dry mouth 08/07/2014  . Dry eyes 08/07/2014  . Other headache syndrome 06/25/2014  . Sinusitis, chronic 06/25/2014  . Neck pain 06/25/2014  . OSA (obstructive sleep apnea) 06/25/2014  . Essential hypertension, benign 06/25/2014    Past Surgical History:  Procedure Laterality Date  . APPENDECTOMY    . CRANIECTOMY FOR EXCISION OF ACOUSTIC NEUROMA    . LEFT HEART CATH AND CORONARY ANGIOGRAPHY N/A 11/24/2016   Procedure: LEFT HEART CATH AND CORONARY ANGIOGRAPHY;  Surgeon: Jettie Booze, MD;  Location:  [facility];  Service: Cardiovascular;  Laterality: N/A;  . PARTIAL HYSTERECTOMY    . TONSILLECTOMY      OB History    No data available       Home Medications    Prior to Admission medications   Medication Sig Start Date End Date Taking? Authorizing Provider  Alpha-Lipoic Acid (LIPOIC ACID PO) Take 2 tablets by mouth 2 (two) times daily.   Yes [provider]  atorvastatin (LIPITOR) 10 MG tablet TAKE 1 TABLET BY MOUTH EVERY DAY AT 6 PM 12/16/16  Yes Lelon Perla, MD  calcium carbonate 1250 MG capsule Take 1,250 mg by mouth 2 (two) times daily with a meal. Reported on 07/09/2015   Yes [provider]  cephALEXin (KEFLEX) 250 MG capsule Take 250 mg by mouth at bedtime.  06/22/14  Yes [provider]  cholecalciferol (VITAMIN D) 1000 UNITS tablet Take 1,000 Units by mouth daily.   Yes [provider]  CRANBERRY CONCENTRATE PO Take 1 capsule by mouth daily.   Yes [provider]  cycloSPORINE (RESTASIS) 0.05 % ophthalmic emulsion Place 1 drop into both eyes 2 (two) times daily.   Yes [provider]  diltiazem (CARDIZEM CD) 120 MG 24 hr capsule Take 1 capsule (120 mg total) by mouth daily. 12/16/16  Yes Lelon Perla, MD  DULoxetine (CYMBALTA) 30 MG capsule TAKE 1 CAPSULE (30 MG TOTAL) BY MOUTH 2 (TWO) TIMES DAILY. 04/15/16  Yes Sater, Nanine Means, MD  fluticasone (FLONASE) 50 MCG/ACT nasal spray Place 2 sprays into both nostrils at bedtime.   Yes [provider]  glucosamine-chondroitin 500-400 MG tablet Take 2 tablets by mouth daily with lunch.   Yes [provider]  losartan (COZAAR) 25 MG tablet Take 25 mg by mouth daily.   Yes [provider]  Meth-Hyo-M Bl-Na Phos-Ph Sal (URIBEL) 118 MG CAPS Take 1 capsule by mouth daily as needed (uti).  01/25/15  Yes [provider]  Multiple Vitamin (MULTIVITAMIN WITH MINERALS) TABS tablet Take 1 tablet by mouth daily.   Yes [provider]    multivitamin-lutein (OCUVITE-LUTEIN) CAPS capsule Take 1 capsule by mouth daily with lunch.   Yes [provider]  omega-3 acid ethyl esters (LOVAZA) 1 G capsule Take 1 g by mouth 2 (two) times daily.   Yes [provider]  oxybutynin (DITROPAN-XL) 10 MG 24 hr tablet Take 10 mg by mouth at bedtime.  10/17/16  Yes [provider]  pantoprazole (PROTONIX) 40 MG tablet Take 40 mg by mouth daily after breakfast.  06/12/14  Yes [provider]  rivaroxaban (XARELTO) 20 MG TABS tablet TAKE 1 TABLET BY MOUTH EVERY DAY WITH SUPPER 12/16/16  Yes Crenshaw, Denice Bors, MD  thyroid (ARMOUR) 60 MG tablet Take 60 mg by mouth daily before breakfast.   Yes [provider]  tiZANidine (ZANAFLEX) 4 MG tablet TAKE 1 TABLET BY MOUTH 3 TIMES A DAY AS NEEDED Patient taking differently: TAKE 1 TABLET BY MOUTH DAILY 08/06/16  Yes Sater, Nanine Means, MD  triamcinolone cream (KENALOG) 0.1 % 1 APPLICATION APPLY ON THE SKIN DAILY AS NEEDED 10/27/16  Yes [provider]  vitamin C (ASCORBIC ACID) 250 MG tablet Take 250 mg by mouth daily.   Yes [provider]  ipratropium (ATROVENT) 0.06 % nasal spray 2 DROPS IN EACH NOSTRIL DAILY AT BEDTIME 07/12/15   [provider]  levofloxacin (LEVAQUIN) 750 MG tablet Take 1 tablet (750 mg total) by mouth daily. 12/22/16 12/27/16  Duffy Bruce, MD  ranitidine (ZANTAC) 150 MG tablet Take 1 tablet (150 mg total) by mouth 2 (two) times daily. 12/22/16 01/01/17  Duffy Bruce, MD  sucralfate (CARAFATE) 1 g tablet Take 1 tablet (1 g total) by mouth 4 (four) times daily -  with meals and at bedtime. 12/22/16   Duffy Bruce, MD    Family History Family History  Problem Relation Age of Onset  . Congestive Heart Failure Mother   . Heart disease Father        History of heart attacks at a later age.      Social History Social History  Substance Use Topics  . Smoking status: Never Smoker  . Smokeless tobacco: Never Used  .  Alcohol use Yes     Allergies   Patient has no known allergies.   Review of Systems Review of Systems  Constitutional: Positive for fatigue. Negative for chills and fever.  HENT: Negative for congestion, rhinorrhea and sore throat.   Eyes: Negative for visual disturbance.  Respiratory: Positive for chest tightness. Negative for cough, shortness of breath and wheezing.   Cardiovascular: Positive for chest pain. Negative for leg swelling.  Gastrointestinal: Positive for nausea. Negative for abdominal pain, diarrhea and vomiting.  Genitourinary: Negative for dysuria, flank pain, vaginal bleeding and vaginal discharge.  Musculoskeletal: Negative for neck pain.  Skin: Negative for rash.  Allergic/Immunologic: Negative for immunocompromised state.  Neurological: Negative for syncope and headaches.  Hematological: Does not bruise/bleed easily.  All other systems reviewed and are negative.    Physical Exam Updated Vital Signs BP (!) 142/85   Pulse (!) 59   Temp 97.9 F (36.6 C) (Oral)   Resp 19   SpO2 99%   Physical Exam  Constitutional: She is oriented to person, place, and time. She appears well-developed and well-nourished. No distress.  HENT:  Head: Normocephalic and atraumatic.  Eyes: Conjunctivae are normal.  Neck: Neck supple.  Cardiovascular: Normal rate, regular rhythm and normal heart sounds.  Exam reveals no friction rub.   No murmur heard. Pulmonary/Chest: Effort normal. No respiratory distress. She has no wheezes. She has rales (mild, bibasilar).  Abdominal: Soft. She exhibits no distension. There is tenderness (mild TTP RUQ, without overt Murphy's; no rebound or guarding). There is no rebound and no guarding.  Musculoskeletal: She exhibits no edema.  Neurological: She is alert and oriented to person, place, and time. She exhibits normal muscle tone.  Skin: Skin is warm. Capillary refill takes less than 2 seconds.  Psychiatric: She has a normal mood and affect.    Nursing note and vitals reviewed.    ED Treatments / Results  Labs (all labs ordered are listed, but only abnormal results are displayed) Labs Reviewed  BASIC METABOLIC PANEL - Abnormal; Notable for the following:       Result Value   Glucose, Bld 105 (*)    All other components within normal limits  CBC - Abnormal; Notable for the following:    RBC 3.65 (*)    Hemoglobin 11.2 (*)    HCT 33.9 (*)    All other components within normal limits  HEPATIC FUNCTION PANEL - Abnormal; Notable for the following:    Bilirubin, Direct <0.1 (*)    All other components within normal limits  URINALYSIS, ROUTINE W REFLEX MICROSCOPIC - Abnormal; Notable for the following:    Leukocytes, UA LARGE (*)    Bacteria, UA RARE (*)    Squamous Epithelial / LPF 0-5 (*)    All other components within normal limits  URINE CULTURE  LIPASE, BLOOD  I-STAT TROPONIN, ED  I-STAT TROPONIN, ED    EKG  EKG Interpretation  Date/Time:  Tuesday December 22 2016 05:42:47 EDT Ventricular Rate:  61 PR Interval:  168 QRS Duration: 112 QT Interval:  420 QTC Calculation: 422 R Axis:   -39 Text Interpretation:  Normal sinus rhythm Left axis deviation Low voltage QRS Incomplete right bundle branch block Cannot rule out Anterior infarct , age undetermined Abnormal ECG Confirmed by Pryor Curia [phone]) on 12/22/2016 6:23:49 AM Also confirmed by Ward, Cyril Mourning [phone]), editor Hattie Perch ([id number])  on 12/22/2016 6:57:20 AM       Radiology Dg Chest 2 View  Result Date: 12/22/2016 CLINICAL DATA:  Pain under right breast. EXAM: CHEST  2 VIEW COMPARISON:  11/23/2016. FINDINGS: Mediastinum hilar structures normal. Heart size normal. Questionable small density noted in the left upper lobe. This could represent a small area of atelectasis or infiltrate. Follow-up exam suggested to demonstrate resolution. No pleural effusion or pneumothorax. No acute bony abnormality identified. Thoracic spine scoliosis and  degenerative change noted. IMPRESSION: 1. Question small density noted in the left upper lobe. This could represent a small area of atelectasis or infiltrate. Follow-up exam suggested to demonstrate resolution. Electronically Signed   By: Marcello Moores  Register   On: 12/22/2016 06:27   US Abdomen Limited Ruq  Result Date: 12/22/2016 CLINICAL DATA:  Right-sided chest pain, history of  melanoma. EXAM: ULTRASOUND ABDOMEN LIMITED RIGHT UPPER QUADRANT COMPARISON:  Noncontrast abdominal CT scan of October 17, 2014 FINDINGS: Gallbladder: The gallbladder is adequately distended with no evidence of stones, wall thickening, or pericholecystic fluid. There is no positive sonographic Murphy sign. Common bile duct: Diameter: 3.8 mm Liver: No focal lesion identified. Within normal limits in parenchymal echogenicity. Portal vein is patent on color Doppler imaging with normal direction of blood flow towards the liver. IMPRESSION: Normal right upper quadrant abdominal ultrasound. Electronically Signed   By: David  Martinique M.D.   On: 12/22/2016 08:04    Procedures Procedures (including critical care time)  Medications Ordered in ED Medications  gi cocktail (Maalox,Lidocaine,Donnatal) (30 mLs Oral Given 12/22/16 0728)  levofloxacin (LEVAQUIN) tablet 750 mg (750 mg Oral Given 12/22/16 9563)     Initial Impression / Assessment and Plan / ED Course  I have reviewed the triage vital signs and the nursing notes.  Pertinent labs & imaging results that were available during my care of the patient were reviewed by me and considered in my medical decision making (see chart for details).     76 yo F with PMHx as above here with atypical chest pain. Primary suspicion is GERD/gastritis, as pain is burning, worse with lying flat, and is now resolved s/p GI cocktail. She recently underwent extensive cardiac work-up including cardiac cath which shows no obstructive coronary disease. Troponin undetectable despite constant sx - doubt ACS.  Pain is not c/w PE or dissection. Of note, pt does have UTI incidentally but do not feel this is related to her sx. No flank pain or signs of pyelo. She also has small infiltrate on CXR - normal WBC, no signs of sepsis.  Pain is resolved with GI cocktail, and is highly c/w likely GI-related pain. RUQ U/S is negative. Will trial antacids. Will tx UTI, possible infiltrate on CXR with Levaquin. Pt o/w well appearing, feels improved and would like to go home. Final Clinical Impressions(s) / ED Diagnoses   Final diagnoses:  RUQ pain  Acute cystitis without hematuria    New Prescriptions Discharge Medication List as of 12/22/2016  9:44 AM    START taking these medications   Details  levofloxacin (LEVAQUIN) 750 MG tablet Take 1 tablet (750 mg total) by mouth daily., Starting Tue 12/22/2016, Until Sun 12/27/2016, Print    ranitidine (ZANTAC) 150 MG tablet Take 1 tablet (150 mg total) by mouth 2 (two) times daily., Starting Tue 12/22/2016, Until Fri 01/01/2017, Print    sucralfate (CARAFATE) 1 g tablet Take 1 tablet (1 g total) by mouth 4 (four) times daily -  with meals and at bedtime., Starting Tue 12/22/2016, Print         Duffy Bruce, MD 12/22/16 [phone]

## 2016-12-24 ENCOUNTER — Other Ambulatory Visit: Payer: Self-pay | Admitting: Neurology

## 2016-12-24 DIAGNOSIS — D649 Anemia, unspecified: Secondary | ICD-10-CM | POA: Diagnosis not present

## 2016-12-24 DIAGNOSIS — I4891 Unspecified atrial fibrillation: Secondary | ICD-10-CM | POA: Diagnosis not present

## 2016-12-24 DIAGNOSIS — Z23 Encounter for immunization: Secondary | ICD-10-CM | POA: Diagnosis not present

## 2016-12-24 DIAGNOSIS — R938 Abnormal findings on diagnostic imaging of other specified body structures: Secondary | ICD-10-CM | POA: Diagnosis not present

## 2016-12-25 LAB — URINE CULTURE

## 2016-12-26 ENCOUNTER — Telehealth: Payer: Self-pay | Admitting: *Deleted

## 2016-12-26 NOTE — Telephone Encounter (Signed)
Post ED Visit - Positive Culture Follow-up  Culture report reviewed by antimicrobial stewardship pharmacist:  []  Cynthia [name], Pharm.D. []  Cynthia [name], Pharm.D., Cynthia [name] AQ-ID []  Cynthia [name], Pharm.D., Cynthia [name] []  Cynthia [name], Pharm.D., Cynthia [name] []  Cynthia [name], [place].D., Cynthia [name], Cynthia [name] []  Cynthia [name], Pharm.D., Cynthia [name], Cynthia [name] []  Cynthia [name], PharmD, Cynthia [name] []  Cynthia [name], PharmD, Cynthia [name] []  Cynthia [name], PharmD, Cynthia [name] Cynthia [name], PharmD  Positive urine culture Treated with Levofloxacin, organism sensitive to the same and no further patient follow-up is required at this time.  Cynthia [name] 12/26/2016, 11:00 AM

## 2016-12-28 DIAGNOSIS — N8110 Cystocele, unspecified: Secondary | ICD-10-CM | POA: Diagnosis not present

## 2016-12-28 DIAGNOSIS — N3000 Acute cystitis without hematuria: Secondary | ICD-10-CM | POA: Diagnosis not present

## 2016-12-28 DIAGNOSIS — R3914 Feeling of incomplete bladder emptying: Secondary | ICD-10-CM | POA: Diagnosis not present

## 2016-12-29 DIAGNOSIS — D649 Anemia, unspecified: Secondary | ICD-10-CM | POA: Diagnosis not present

## 2016-12-29 DIAGNOSIS — R938 Abnormal findings on diagnostic imaging of other specified body structures: Secondary | ICD-10-CM | POA: Diagnosis not present

## 2016-12-29 DIAGNOSIS — J449 Chronic obstructive pulmonary disease, unspecified: Secondary | ICD-10-CM | POA: Diagnosis not present

## 2016-12-31 DIAGNOSIS — M7581 Other shoulder lesions, right shoulder: Secondary | ICD-10-CM | POA: Diagnosis not present

## 2017-01-06 DIAGNOSIS — R3914 Feeling of incomplete bladder emptying: Secondary | ICD-10-CM | POA: Diagnosis not present

## 2017-01-07 ENCOUNTER — Telehealth: Payer: Self-pay | Admitting: Cardiology

## 2017-01-07 NOTE — Telephone Encounter (Signed)
New message    Pt c/o BP issue: STAT if pt c/o blurred vision, one-sided weakness or slurred speech  1. What are your last 5 BP readings? This morning-145/82 p-66 145/85 p-66 150/85 p-69  2. Are you having any other symptoms (ex. Dizziness, headache, blurred vision, passed out)? No   3. What is your BP issue? BP has been elevated for a few days.

## 2017-01-07 NOTE — Congregational Nurse Program (Signed)
Congregational Nurse Program Note  Date of Encounter: 01/07/2017  Past Medical History: Past Medical History:  Diagnosis Date  . A-fib ([place])   . Cancer ([place])    Melanoma  . Hearing loss   . Hypertension     Encounter Details:     CNP Questionnaire - 01/07/17 1432      Patient Demographics   Is this a new or existing patient? Existing   Patient is considered a/an Not Applicable   Race Caucasian/White     Patient Assistance   Location of Patient Assistance Not Applicable   Patient's financial/insurance status Medicare   Uninsured Patient ([place] [employer] Connects) No   Patient referred to apply for the following financial assistance Not Applicable   Food insecurities addressed Not Applicable   Transportation assistance No   Assistance securing medications No   Educational health offerings Exercise/physical activity     Encounter Details   Primary purpose of visit Education/Health Concerns   Was an Emergency Department visit averted? Not Applicable   Does patient have a medical provider? Yes   Patient referred to Not Applicable   Was a mental health screening completed? (GAINS tool) No   Does patient have dental issues? No   Does patient have vision issues? No   Does your patient have an abnormal blood pressure today? No   Since previous encounter, have you referred patient for abnormal blood pressure that resulted in a new diagnosis or medication change? No   Does your patient have an abnormal blood glucose today? No   Since previous encounter, have you referred patient for abnormal blood glucose that resulted in a new diagnosis or medication change? No   Was there a life-saving intervention made? No    Attended chair yoga class.

## 2017-01-07 NOTE — Telephone Encounter (Signed)
[id number] 

## 2017-01-12 NOTE — Telephone Encounter (Signed)
[id number] 

## 2017-01-14 NOTE — Telephone Encounter (Signed)
Left message for pt to call.

## 2017-01-19 DIAGNOSIS — R350 Frequency of micturition: Secondary | ICD-10-CM | POA: Diagnosis not present

## 2017-01-19 DIAGNOSIS — R3914 Feeling of incomplete bladder emptying: Secondary | ICD-10-CM | POA: Diagnosis not present

## 2017-01-24 ENCOUNTER — Other Ambulatory Visit: Payer: Self-pay | Admitting: Neurology

## 2017-01-29 DIAGNOSIS — D649 Anemia, unspecified: Secondary | ICD-10-CM | POA: Diagnosis not present

## 2017-01-29 DIAGNOSIS — Z0000 Encounter for general adult medical examination without abnormal findings: Secondary | ICD-10-CM | POA: Diagnosis not present

## 2017-01-29 DIAGNOSIS — M8580 Other specified disorders of bone density and structure, unspecified site: Secondary | ICD-10-CM | POA: Diagnosis not present

## 2017-01-29 DIAGNOSIS — E039 Hypothyroidism, unspecified: Secondary | ICD-10-CM | POA: Diagnosis not present

## 2017-01-29 DIAGNOSIS — M810 Age-related osteoporosis without current pathological fracture: Secondary | ICD-10-CM | POA: Diagnosis not present

## 2017-01-29 DIAGNOSIS — E7800 Pure hypercholesterolemia, unspecified: Secondary | ICD-10-CM | POA: Diagnosis not present

## 2017-01-29 DIAGNOSIS — J449 Chronic obstructive pulmonary disease, unspecified: Secondary | ICD-10-CM | POA: Diagnosis not present

## 2017-01-29 DIAGNOSIS — I10 Essential (primary) hypertension: Secondary | ICD-10-CM | POA: Diagnosis not present

## 2017-02-04 DIAGNOSIS — I8393 Asymptomatic varicose veins of bilateral lower extremities: Secondary | ICD-10-CM | POA: Diagnosis not present

## 2017-02-04 DIAGNOSIS — N390 Urinary tract infection, site not specified: Secondary | ICD-10-CM | POA: Diagnosis not present

## 2017-02-04 DIAGNOSIS — I10 Essential (primary) hypertension: Secondary | ICD-10-CM | POA: Diagnosis not present

## 2017-02-04 DIAGNOSIS — M25519 Pain in unspecified shoulder: Secondary | ICD-10-CM | POA: Diagnosis not present

## 2017-02-04 DIAGNOSIS — Z6827 Body mass index (BMI) 27.0-27.9, adult: Secondary | ICD-10-CM | POA: Diagnosis not present

## 2017-02-04 DIAGNOSIS — J329 Chronic sinusitis, unspecified: Secondary | ICD-10-CM | POA: Diagnosis not present

## 2017-02-04 DIAGNOSIS — R0789 Other chest pain: Secondary | ICD-10-CM | POA: Diagnosis not present

## 2017-02-04 DIAGNOSIS — E049 Nontoxic goiter, unspecified: Secondary | ICD-10-CM | POA: Diagnosis not present

## 2017-02-04 DIAGNOSIS — M25569 Pain in unspecified knee: Secondary | ICD-10-CM | POA: Diagnosis not present

## 2017-02-04 DIAGNOSIS — R350 Frequency of micturition: Secondary | ICD-10-CM | POA: Diagnosis not present

## 2017-02-04 DIAGNOSIS — E039 Hypothyroidism, unspecified: Secondary | ICD-10-CM | POA: Diagnosis not present

## 2017-02-05 ENCOUNTER — Telehealth: Payer: Self-pay | Admitting: Cardiology

## 2017-02-05 NOTE — Telephone Encounter (Signed)
Received records from [facility] for appointment on 02/11/17 with Kerin Ransom, [place].  Records put with Luke's schedule for 02/11/17. lp

## 2017-02-11 ENCOUNTER — Ambulatory Visit: Payer: Medicare Other | Admitting: Cardiology

## 2017-02-12 DIAGNOSIS — N3020 Other chronic cystitis without hematuria: Secondary | ICD-10-CM | POA: Diagnosis not present

## 2017-02-12 DIAGNOSIS — N3946 Mixed incontinence: Secondary | ICD-10-CM | POA: Diagnosis not present

## 2017-02-12 DIAGNOSIS — R3914 Feeling of incomplete bladder emptying: Secondary | ICD-10-CM | POA: Diagnosis not present

## 2017-02-16 NOTE — Congregational Nurse Program (Signed)
Congregational Nurse Program Note  Date of Encounter: 02/16/2017  Past Medical History: Past Medical History:  Diagnosis Date  . A-fib ([place])   . Cancer ([place])    Melanoma  . Hearing loss   . Hypertension     Encounter Details: CNP Questionnaire - 02/16/17 1448      Questionnaire   Patient Status  Not Applicable    Race  White or Caucasian    Location Patient Served At  Not Applicable    Insurance  Medicare    Uninsured  Not Applicable    Food  No food insecurities    Housing/Utilities  Yes, have permanent housing    Transportation  No transportation needs    Interpersonal Safety  Yes, feel physically and emotionally safe where you currently live    Medication  No medication insecurities    Medical Provider  Yes    Referrals  Not Applicable    ED Visit Averted  Not Applicable    Life-Saving Intervention Made  Not Applicable      Madelaine Etienne, [place], [phone].

## 2017-02-17 DIAGNOSIS — N3020 Other chronic cystitis without hematuria: Secondary | ICD-10-CM | POA: Diagnosis not present

## 2017-02-19 DIAGNOSIS — M859 Disorder of bone density and structure, unspecified: Secondary | ICD-10-CM | POA: Diagnosis not present

## 2017-02-19 DIAGNOSIS — M8580 Other specified disorders of bone density and structure, unspecified site: Secondary | ICD-10-CM | POA: Diagnosis not present

## 2017-02-19 DIAGNOSIS — I10 Essential (primary) hypertension: Secondary | ICD-10-CM | POA: Diagnosis not present

## 2017-03-01 ENCOUNTER — Telehealth: Payer: Self-pay | Admitting: Cardiology

## 2017-03-01 NOTE — Telephone Encounter (Signed)
Agree with following; can schedule PAOV if needed Kirk Ruths

## 2017-03-01 NOTE — Telephone Encounter (Signed)
New Message  Pt call requesting to speak with RN. Pt states she felt she was in afib. She was experiencing some chest tightness. And her bp was 127/74 pulse of 40. Please call back to discuss

## 2017-03-01 NOTE — Telephone Encounter (Signed)
Spoke with pt, Aware of dr Jacalyn Lefevre recommendations. They will call back if needed.

## 2017-03-01 NOTE — Telephone Encounter (Signed)
Spoke with pt, this morning was the first time since august that she has had an irregular pulse light when taking her bp. She reports a little tightness in her chest and feeling uncomfortable when lying on her left side. She counted her pulse for 1 minute while on the phone with me and got 41 bpm. She takes her diltiazem at bedtime. Explained not really anything to do at this time but hope for return to sinus rhythm. Will forward for dr Stanford Breed review

## 2017-03-03 DIAGNOSIS — J323 Chronic sphenoidal sinusitis: Secondary | ICD-10-CM | POA: Diagnosis not present

## 2017-03-03 DIAGNOSIS — J320 Chronic maxillary sinusitis: Secondary | ICD-10-CM | POA: Diagnosis not present

## 2017-03-09 ENCOUNTER — Telehealth: Payer: Self-pay | Admitting: Cardiology

## 2017-03-09 MED ORDER — LOSARTAN POTASSIUM 100 MG PO TABS: 100.0000 mg | ORAL_TABLET | Freq: Every day | ORAL | 3 refills | Status: DC

## 2017-03-09 NOTE — Telephone Encounter (Signed)
Pt of Dr. Stanford Breed. Hx of A Fib  reporting BP concerns, A Fib episode, and GI issues:  Spoke w both patient and husband who communicated information.  Husband reports her BP has been going up gradually over the last week. BP was 152/77. HR was 77 this morning  156/73 HR 70 on 4th 138/90 HR 68 on 5th  140/73 HR 71 18th  127/74 HR "40" on 19th (husband states doesn't make sense, this was an outlier)   Was prescribed Losartan 100mg  daily, but this dropped her BP dramatically when started. Husband quartered the tablet and started her 25mg  daily per Dr. Jacalyn Lefevre instructions 12/01/16 OV.  He notes over the last couple weeks, he has been gradually increasing the med in response to the higher BP readings, and this AM she took a full dose of 100mg  losartan   Pt notes she had a fib episode last night, episode resolved in 15-20 mins which is typical for her. Most recent prior to that was a week or two ago.  GI issues - taking gasex and pantoprazole - wants to know if this might be driving some of her symptoms.  I advised to stay on the 100mg  losartan daily, may need to wait a few days to see what BP does, continue to check HR and BP daily. Asked her to report any further episodes of A Fib, esp if sustained longer than 20 mins. Routed to Dr. Stanford Breed to see if appt or anything further advised.

## 2017-03-09 NOTE — Telephone Encounter (Signed)
Continue present dose of cozaar; follow BP. Kirk Ruths

## 2017-03-09 NOTE — Telephone Encounter (Signed)
Spoke w patient and husband. Communicated recommendation to remain on 100mg  daily of losartan and follow BP.  Scheduled pt for her follow up in February at request. No further needs identified.  She's aware to call if any concerns prior to next appt.

## 2017-03-09 NOTE — Telephone Encounter (Signed)
Pt c/o BP issue: STAT if pt c/o blurred vision, one-sided weakness or slurred speech  1. What are your last 5 BP readings? 152/77  2. Are you having any other symptoms (ex. Dizziness, headache, blurred vision, passed out)?  Headache and  woozie   3. What is your BP issue?  high

## 2017-03-09 NOTE — Telephone Encounter (Signed)
Addition  Please call   [phone] pt dont know where the other phone is

## 2017-03-12 NOTE — Congregational Nurse Program (Signed)
Congregational Nurse Program Note  Date of Encounter: 03/12/2017  Past Medical History: Past Medical History:  Diagnosis Date  . A-fib ([place])   . Cancer ([place])    Melanoma  . Hearing loss   . Hypertension     Encounter Details: CNP Questionnaire - 03/12/17 2249      Questionnaire   Patient Status  Not Applicable    Race  White or Caucasian    Location Patient Served At  Not Applicable    Insurance  Medicare    Uninsured  Not Applicable    Food  No food insecurities    Housing/Utilities  Yes, have permanent housing    Transportation  No transportation needs    Interpersonal Safety  Yes, feel physically and emotionally safe where you currently live    Medication  No medication insecurities    Medical Provider  Yes    Referrals  Not Applicable    ED Visit Averted  Not Applicable    Life-Saving Intervention Made  Not Applicable     CNP, Madelaine Etienne, [phone].

## 2017-03-30 ENCOUNTER — Telehealth: Payer: Self-pay | Admitting: Cardiology

## 2017-03-30 NOTE — Telephone Encounter (Signed)
Pt of Dr. Stanford Breed  Returned call. She is following up on a call in November regarding her BPs. Notes she has been checking daily, for last few days, BPs running between 110-135/69-74.  She also notes when she checks the HR on the BP cuff, it reads an "A Fib alert". Rate itself was calculated at 80 yesterday, 50 this morning.  She denies any symptoms such as dizziness, shortness of breath, chest discomfort, lightheadedness, fatigue, or palpitations. Advised to call if she has any of these symptoms.  She has had a history of atrial fibrillation, is currently on xarelto for anticoagulation. Reviewed recent EKGs which showed NSR.   I instructed her on how to obtain a manual HR reading via radial pulse.   Informed patient I would seek advice from Dr. Stanford Breed regarding the recent alerts on her BP monitor - she is scheduled to return Feb 28th for her 6 mo f/u - do you recommend she come in for an APP visit or EKG check in interim?

## 2017-03-30 NOTE — Telephone Encounter (Signed)
Recommendation communicated. Pt verbalized thanks and understanding.

## 2017-03-30 NOTE — Telephone Encounter (Signed)
Pt c/o BP issue: STAT if pt c/o blurred vision, one-sided weakness or slurred speech  1. What are your last 5 BP readings? 119/69    2. Are you having any other symptoms (ex. Dizziness, headache, blurred vision, passed out)? no  3. What is your BP issue?  High no

## 2017-03-30 NOTE — Telephone Encounter (Signed)
Fu as scheduled if no symptoms Kirk Ruths

## 2017-04-01 ENCOUNTER — Telehealth: Payer: Self-pay | Admitting: Cardiology

## 2017-04-01 NOTE — Telephone Encounter (Signed)
Agree.  Also refer to Deland Pretty, MD of she thinks that she is having increased GERD.

## 2017-04-01 NOTE — Telephone Encounter (Signed)
New message    Took a tums and lunch time medication with her brunch   Patient c/o Palpitations:  High priority if patient c/o lightheadedness, shortness of breath, or chest pain  1) How long have you had palpitations/irregular HR/ Afib? Are you having the symptoms now?  Started about 330p today   2) Are you currently experiencing lightheadedness, SOB or CP? Chest pressure  , nauseated stomach  3) Do you have a history of afib (atrial fibrillation) or irregular heart rhythm? Yes   4) Have you checked your BP or HR? (document readings if available):  Not this time  130/70 pulse 68 this morning , (had pt check vitals while on the phone with me ) 113/63 pulse 68 states she had AFIB event on 16th as well.  5) Are you experiencing any other symptoms?  Feeling like her stomach is upset

## 2017-04-01 NOTE — Telephone Encounter (Signed)
Pt advised and verbalized her understanding.

## 2017-04-01 NOTE — Telephone Encounter (Signed)
Returned call. Pt experienced onset of what she thinks were GERD symptoms earlier today. States it occurred after a late breakfast/brunch. She also took her morning meds late. Symptoms occurred early afternoon and lasted 20-30 mins - She describes having some tightness in her upper abdomen/chest, states symptoms resolved.  She has had similar episodes in the past. She has tums, protonix, and zantac available for use. I advised to take 1 additional tab of protonix, if unresolved or new symptoms to call back (may get in touch w/ [place] via after hours pager). Pt aware of this instruction, as well as advice to go to ER for any sudden onset of jaw pain, arm pain, shortness of breath, lightheadedness or dizziness.  Routed to [facility] to advise further.

## 2017-04-09 DIAGNOSIS — R3914 Feeling of incomplete bladder emptying: Secondary | ICD-10-CM | POA: Diagnosis not present

## 2017-04-09 DIAGNOSIS — N8110 Cystocele, unspecified: Secondary | ICD-10-CM | POA: Diagnosis not present

## 2017-04-09 DIAGNOSIS — N3020 Other chronic cystitis without hematuria: Secondary | ICD-10-CM | POA: Diagnosis not present

## 2017-04-29 ENCOUNTER — Ambulatory Visit: Payer: Medicare Other | Admitting: Neurology

## 2017-04-30 ENCOUNTER — Encounter: Payer: Self-pay | Admitting: Neurology

## 2017-05-13 NOTE — Congregational Nurse Program (Signed)
Congregational Nurse Program Note  Date of Encounter: 05/13/2017  Past Medical History: Past Medical History:  Diagnosis Date  . A-fib ([place])   . Cancer ([place])    Melanoma  . Hearing loss   . Hypertension     Encounter Details: CNP Questionnaire - 05/13/17 1703      Questionnaire   Patient Status  Not Applicable    Race  White or Caucasian    Location Patient Served At  Not Applicable    Insurance  Medicare    Uninsured  Not Applicable    Food  No food insecurities    Housing/Utilities  Yes, have permanent housing    Transportation  No transportation needs    Interpersonal Safety  Yes, feel physically and emotionally safe where you currently live    Medication  No medication insecurities    Medical Provider  Yes    Referrals  Not Applicable    ED Visit Averted  Not Applicable    Life-Saving Intervention Made  Not Applicable

## 2017-05-17 ENCOUNTER — Encounter: Payer: Self-pay | Admitting: Neurology

## 2017-05-17 ENCOUNTER — Other Ambulatory Visit: Payer: Self-pay

## 2017-05-17 ENCOUNTER — Encounter (INDEPENDENT_AMBULATORY_CARE_PROVIDER_SITE_OTHER): Payer: Self-pay

## 2017-05-17 ENCOUNTER — Ambulatory Visit (INDEPENDENT_AMBULATORY_CARE_PROVIDER_SITE_OTHER): Payer: Medicare PPO | Admitting: Neurology

## 2017-05-17 VITALS — BP 113/68 | HR 68 | Resp 20 | Ht 65.0 in | Wt 154.0 lb

## 2017-05-17 DIAGNOSIS — G629 Polyneuropathy, unspecified: Secondary | ICD-10-CM

## 2017-05-17 DIAGNOSIS — M25571 Pain in right ankle and joints of right foot: Secondary | ICD-10-CM

## 2017-05-17 DIAGNOSIS — M542 Cervicalgia: Secondary | ICD-10-CM | POA: Diagnosis not present

## 2017-05-17 DIAGNOSIS — M7550 Bursitis of unspecified shoulder: Secondary | ICD-10-CM | POA: Diagnosis not present

## 2017-05-17 DIAGNOSIS — M7552 Bursitis of left shoulder: Secondary | ICD-10-CM | POA: Diagnosis not present

## 2017-05-17 DIAGNOSIS — G4489 Other headache syndrome: Secondary | ICD-10-CM | POA: Diagnosis not present

## 2017-05-17 DIAGNOSIS — M7551 Bursitis of right shoulder: Secondary | ICD-10-CM | POA: Diagnosis not present

## 2017-05-17 NOTE — Progress Notes (Signed)
[facility]  PATIENT: Cynthia [name] DOB: [birth year] _________________________________   HISTORICAL  CHIEF COMPLAINT:  Chief Complaint  Patient presents with  . Sleep Apnea    Sts. is compliant with CPAP. Neck pain and left shoulder pain are worse, but h/a's are improved. Burning pain in bilat feet has improved, only slight sensation now.  She is wearing otc braces both feet as needed, for arch support, foot pain.  Wearing her showes too large in order to accomodate braces/fim  . Headache  . Neck Pain  . Left Shoulder Pain  . Bilateral Foot Pain    HISTORY OF PRESENT ILLNESS:  [name] is a 77 yo woman with left neck pain and headache.  She is noting more burning sensation in her feet.   She has been tested for DM and was fine.    Update 05/17/2017: Her dysesthetic foot pain is better on alpha-lipoic acid.    She still has some foot/ankle pain worse with walking that she feels may be more related to arthritis.    Her ankles seem weak and she sometimes turns them.     Her neck/shoulder pain is still bothersome.   She stopped massage and felt it helped her some.  Shoulder pain is worse with elevation and external rotation.   She is on Xarelto for AFib so cannot be on an NSAID.    HA's are generally doing well.     Her sleep is fine and she uses CPAP nightly.   She uses it for the entire night.   With CPAP, sleepiness improved.    She has trouble emptying her bladder and needs to catheterize at times and also has urinary frequency.    She is on oxybutynin.  She notes dry mouth and also has felt memory is worse (will recall with a hint).     From 10/30/16: Burning pain:   She has a burning pain in her feet.   She puts on a lotion and takes a homeopathic drop with minimal benefit.    When she flexes her toes the distal sole feels strange.    Numbness goes up to the ankle and she also notes weak ankles.       Neck pain:   She is having more pain in the left shoulder and  neck region.   She did not get much benefit from chiropractor. She was on etodolac and  Tizanidine.  She feels they help some.   She gets massage twice a month.     She tolerates the medications well.   No GI upset and only minimal sleepiness early morning.      HA's:   Headaches are not occurring much now.     She notes doing a lot of seated computer work and sometimes gets stiff.   She has had no severe headaches.  No visual changes, numbness or weakness.    Dry eye:   She also reports dry eyes.  SSA/SSB were normal.  Restasis has helped her dry eyes.     No rashes but she has more itching.      OSA:   She uses CPAP for OSA.Marland Kitchen   She uses a Resmed FF mask.  She is very compliant.   She does not snore through the mask.    She feels better when she sleeps the whole night with it.     Other:   She injured her left ankle/foot and is doing PT for a torn  tendon.      REVIEW OF SYSTEMS: Constitutional: No fevers, chills, sweats, or change in appetite Eyes: No visual changes, double vision, eye pain Ear, nose and throat: Chronic sinusutis.  Bilat hearing loss, worse on left  Cardiovascular: No chest pain, palpitations Respiratory: No shortness of breath at rest or with exertion.   No wheezes.  Has OSA GastrointestinaI: No nausea, vomiting, diarrhea, abdominal pain, fecal incontinence.   Occ dysphagia Genitourinary: Frequent UTIs.  SOme frequency. Musculoskeletal: as above Integumentary: No rash, pruritus, skin lesions Neurological: as above Psychiatric: No depression at this time.  No anxiety Endocrine: No palpitations, diaphoresis, change in appetite.  Increased thirst Hematologic/Lymphatic: No anemia, purpura, petechiae. Allergic/Immunologic: No itchy/runny eyes, recent allergic reactions, rashes  ALLERGIES: No Known Allergies  HOME MEDICATIONS:  Current Outpatient Medications:  .  Alpha-Lipoic Acid (LIPOIC ACID PO), Take 2 tablets by mouth 2 (two) times daily., Disp: , Rfl:  .   atorvastatin (LIPITOR) 10 MG tablet, TAKE 1 TABLET BY MOUTH EVERY DAY AT 6 PM, Disp: 30 tablet, Rfl: 12 .  calcium carbonate 1250 MG capsule, Take 1,250 mg by mouth 2 (two) times daily with a meal. Reported on 07/09/2015, Disp: , Rfl:  .  cephALEXin (KEFLEX) 250 MG capsule, Take 250 mg by mouth at bedtime. , Disp: , Rfl:  .  cholecalciferol (VITAMIN D) 1000 UNITS tablet, Take 1,000 Units by mouth daily., Disp: , Rfl:  .  CRANBERRY CONCENTRATE PO, Take 1 capsule by mouth daily., Disp: , Rfl:  .  cycloSPORINE (RESTASIS) 0.05 % ophthalmic emulsion, Place 1 drop into both eyes 2 (two) times daily., Disp: , Rfl:  .  diltiazem (CARDIZEM CD) 120 MG 24 hr capsule, Take 1 capsule (120 mg total) by mouth daily., Disp: 30 capsule, Rfl: 12 .  DULoxetine (CYMBALTA) 30 MG capsule, TAKE 1 CAPSULE (30 MG TOTAL) BY MOUTH 2 (TWO) TIMES DAILY., Disp: 60 capsule, Rfl: 5 .  fluticasone (FLONASE) 50 MCG/ACT nasal spray, Place 2 sprays into both nostrils at bedtime., Disp: , Rfl:  .  glucosamine-chondroitin 500-400 MG tablet, Take 2 tablets by mouth daily with lunch., Disp: , Rfl:  .  losartan (COZAAR) 100 MG tablet, Take 1 tablet (100 mg total) by mouth daily., Disp: 30 tablet, Rfl: 3 .  Meth-Hyo-M Bl-Na Phos-Ph Sal (URIBEL) 118 MG CAPS, Take 1 capsule by mouth daily as needed (uti). , Disp: , Rfl: 3 .  Multiple Vitamin (MULTIVITAMIN WITH MINERALS) TABS tablet, Take 1 tablet by mouth daily., Disp: , Rfl:  .  multivitamin-lutein (OCUVITE-LUTEIN) CAPS capsule, Take 1 capsule by mouth daily with lunch., Disp: , Rfl:  .  omega-3 acid ethyl esters (LOVAZA) 1 G capsule, Take 1 g by mouth 2 (two) times daily., Disp: , Rfl:  .  oxybutynin (DITROPAN-XL) 10 MG 24 hr tablet, Take 10 mg by mouth at bedtime. , Disp: , Rfl: 11 .  pantoprazole (PROTONIX) 40 MG tablet, Take 40 mg by mouth daily after breakfast. , Disp: , Rfl: 1 .  rivaroxaban (XARELTO) 20 MG TABS tablet, TAKE 1 TABLET BY MOUTH EVERY DAY WITH SUPPER, Disp: 30 tablet, Rfl:  12 .  sucralfate (CARAFATE) 1 g tablet, Take 1 tablet (1 g total) by mouth 4 (four) times daily -  with meals and at bedtime., Disp: 40 tablet, Rfl: 0 .  thyroid (ARMOUR) 60 MG tablet, Take 60 mg by mouth daily before breakfast., Disp: , Rfl:  .  tiZANidine (ZANAFLEX) 4 MG tablet, TAKE 1 TABLET BY MOUTH 3 TIMES  A DAY AS NEEDED, Disp: 90 tablet, Rfl: 1 .  triamcinolone cream (KENALOG) 0.1 %, 1 APPLICATION APPLY ON THE SKIN DAILY AS NEEDED, Disp: , Rfl: 2 .  vitamin C (ASCORBIC ACID) 250 MG tablet, Take 250 mg by mouth daily., Disp: , Rfl:  .  ipratropium (ATROVENT) 0.06 % nasal spray, 2 DROPS IN EACH NOSTRIL DAILY AT BEDTIME, Disp: , Rfl: 1 .  ranitidine (ZANTAC) 150 MG tablet, Take 1 tablet (150 mg total) by mouth 2 (two) times daily., Disp: 20 tablet, Rfl: 0  PAST MEDICAL HISTORY: Past Medical History:  Diagnosis Date  . A-fib ([place])   . Cancer ([place])    Melanoma  . Hearing loss   . Hypertension     PAST SURGICAL HISTORY: Past Surgical History:  Procedure Laterality Date  . APPENDECTOMY    . CRANIECTOMY FOR EXCISION OF ACOUSTIC NEUROMA    . LEFT HEART CATH AND CORONARY ANGIOGRAPHY N/A 11/24/2016   Procedure: LEFT HEART CATH AND CORONARY ANGIOGRAPHY;  Surgeon: Jettie Booze, MD;  Location: [facility];  Service: Cardiovascular;  Laterality: N/A;  . PARTIAL HYSTERECTOMY    . TONSILLECTOMY      FAMILY HISTORY: Family History  Problem Relation Age of Onset  . Congestive Heart Failure Mother   . Heart disease Father        History of heart attacks at a later age.      SOCIAL HISTORY:  Social History   Socioeconomic History  . Marital status: Married    Spouse name: Not on file  . Number of children: Not on file  . Years of education: Not on file  . Highest education level: Not on file  Social Needs  . Financial resource strain: Not on file  . Food insecurity - worry: Not on file  . Food insecurity - inability: Not on file  . Transportation needs - medical:  Not on file  . Transportation needs - non-medical: Not on file  Occupational History  . Not on file  Tobacco Use  . Smoking status: Never Smoker  . Smokeless tobacco: Never Used  Substance and Sexual Activity  . Alcohol use: Yes  . Drug use: No  . Sexual activity: Not on file  Other Topics Concern  . Not on file  Social History Narrative  . Not on file     PHYSICAL EXAM  Vitals:   05/17/17 1043  BP: 113/68  Pulse: 68  Resp: 20  Weight: 154 lb (69.9 kg)  Height: 5\' 5"  (1.651 m)    Body mass index is 25.63 kg/m.   General: The patient is well-developed and well-nourished and in no acute distress  Neck: The neck is supple.  The neck is tender at the trapezius muscle. No occipital tenderness today.   .     Skin: Extremities are without significant edema, rash.  No sclerodactyly  Musculoskeletal:  Back is non-tender.  The shoulders are tender over the subacromial bursa and the [facility] joint, left worse than right.  and she has reduced ROM on the left.   Neurologic Exam  Mental status: The patient is alert and oriented x 3 at the time of the examination. The patient has normal recent and remote memory, with an apparently normal attention span and concentration ability.   Speech is normal.  Cranial nerves: Extraocular movements are full.   Facial strength and sensation is normal.  Trapezius strength is normal.   No dysarthria is noted.   She has decreased  hearing on left  Motor:  Muscle bulk is normal.   Tone is normal. Strength is  5 / 5 in all 4 extremities.   Sensory: Sensory testing is intact to soft touch in all 4 extremities.  Coordination: Cerebellar testing reveals good finger-nose-finger bilaterally.  Gait and station: Station is normal.   Her gait is mildly wode and tandem is poor.     DIAGNOSTIC DATA (LABS, IMAGING, TESTING) - I reviewed patient records, labs, notes, testing and imaging myself where available.      ASSESSMENT AND  PLAN  Polyneuropathy  Bursitis, subacromial  Other headache syndrome  Neck pain  Pain in joint involving right ankle and foot   1.  Reason to start tizanidine 2 one half pill up to 3 times a day as she notes some sleepiness.  Reduce etodolac to a rare as needed basis as she is also on Eliquis.   2.  Remain active, exercise as tolerated.   Continue CPAP 3.   Inject the left subacromial bursa with 30 mg Depomedrol in Marcaine using sterile technique.   Inject left before meals joint with 10 mg Depo-Medrol and Marcaine using sterile technique, inject right subacromial bursa with 20 mg Depo-Medrol in Marcaine and bilateral trapezius muscles with 20 mg Depo-Medrol and Marcaine.  She tolerated the procedure well. 5.   Continue lpha lipoic acid 600-800 mg daily.  Stop Oxybutynin at this seems to be causing dry mouth and may also be contributing to mild memory issues. 6.   rtc 6 months for scheduled visit if stable.   If neck pain or shoulder pain worsens, she will call us back sooner  Richard A. Felecia Shelling, MD, PhD 12/18/6732, 19:37 AM Certified in Neurology, Clinical Neurophysiology, Sleep Medicine, Pain Medicine and Neuroimaging  [facility] [address] [place], [place] [address] [phone]

## 2017-05-26 DIAGNOSIS — R079 Chest pain, unspecified: Secondary | ICD-10-CM | POA: Diagnosis not present

## 2017-05-30 ENCOUNTER — Other Ambulatory Visit: Payer: Self-pay | Admitting: Neurology

## 2017-06-02 ENCOUNTER — Telehealth: Payer: Self-pay | Admitting: Cardiology

## 2017-06-02 NOTE — Telephone Encounter (Signed)
New message   Patient c/o Palpitations:  High priority if patient c/o lightheadedness, shortness of breath, or chest pain  1) How long have you had palpitations/irregular HR/ Afib? Are you having the symptoms now? today  2) Are you currently experiencing lightheadedness, SOB or CP? no  3) Do you have a history of afib (atrial fibrillation) or irregular heart rhythm? yes  4) Have you checked your BP or HR? (document readings if available): 160/88 hr: 74  5) Are you experiencing any other symptoms? dizziness

## 2017-06-02 NOTE — Telephone Encounter (Signed)
Returned call to patient of Dr. Stanford Breed who has PAF. She was cleaning up in the kitchen today doing a little more than she normally does. She started feeling a little woozy. She decided to check her BP - 160/88, HR - 74. The BP cuff indicated an erratic heart beat. She states this is an isolated event. She feels better at the time of call. Confirmed compliance with xarelto, diltiazem, losartan. Advised to monitor symptoms. She is scheduled for MD OV on Feb 28.   Will route to MD for recommendations.

## 2017-06-03 NOTE — Progress Notes (Deleted)
HPI: FU atrial fibrillation. Patient was previously hospitalized with chest pain. Echocardiogram August 2018 showed normal LV systolic function, grade 1 diastolic dysfunction. Cardiac catheterization August 2018 showed normal LV function, no significant coronary disease and low left ventricular end-diastolic pressure. TSH was low at 0.148. Free T4 and free T3 normal. Patient was noted to have atrial fibrillation during the hospitalization and converted to sinus rhythm. Since last seen   Current Outpatient Medications  Medication Sig Dispense Refill  . Alpha-Lipoic Acid (LIPOIC ACID PO) Take 2 tablets by mouth 2 (two) times daily.    Marland Kitchen atorvastatin (LIPITOR) 10 MG tablet TAKE 1 TABLET BY MOUTH EVERY DAY AT 6 PM 30 tablet 12  . calcium carbonate 1250 MG capsule Take 1,250 mg by mouth 2 (two) times daily with a meal. Reported on 07/09/2015    . cephALEXin (KEFLEX) 250 MG capsule Take 250 mg by mouth at bedtime.     . cholecalciferol (VITAMIN D) 1000 UNITS tablet Take 1,000 Units by mouth daily.    Marland Kitchen CRANBERRY CONCENTRATE PO Take 1 capsule by mouth daily.    . cycloSPORINE (RESTASIS) 0.05 % ophthalmic emulsion Place 1 drop into both eyes 2 (two) times daily.    Marland Kitchen diltiazem (CARDIZEM CD) 120 MG 24 hr capsule Take 1 capsule (120 mg total) by mouth daily. 30 capsule 12  . DULoxetine (CYMBALTA) 30 MG capsule TAKE 1 CAPSULE (30 MG TOTAL) BY MOUTH 2 (TWO) TIMES DAILY. 60 capsule 5  . fluticasone (FLONASE) 50 MCG/ACT nasal spray Place 2 sprays into both nostrils at bedtime.    Marland Kitchen glucosamine-chondroitin 500-400 MG tablet Take 2 tablets by mouth daily with lunch.    Marland Kitchen ipratropium (ATROVENT) 0.06 % nasal spray 2 DROPS IN EACH NOSTRIL DAILY AT BEDTIME  1  . losartan (COZAAR) 100 MG tablet Take 1 tablet (100 mg total) by mouth daily. 30 tablet 3  . Meth-Hyo-M Bl-Na Phos-Ph Sal (URIBEL) 118 MG CAPS Take 1 capsule by mouth daily as needed (uti).   3  . Multiple Vitamin (MULTIVITAMIN WITH MINERALS) TABS  tablet Take 1 tablet by mouth daily.    . multivitamin-lutein (OCUVITE-LUTEIN) CAPS capsule Take 1 capsule by mouth daily with lunch.    . omega-3 acid ethyl esters (LOVAZA) 1 G capsule Take 1 g by mouth 2 (two) times daily.    Marland Kitchen oxybutynin (DITROPAN-XL) 10 MG 24 hr tablet Take 10 mg by mouth at bedtime.   11  . pantoprazole (PROTONIX) 40 MG tablet Take 40 mg by mouth daily after breakfast.   1  . ranitidine (ZANTAC) 150 MG tablet Take 1 tablet (150 mg total) by mouth 2 (two) times daily. 20 tablet 0  . rivaroxaban (XARELTO) 20 MG TABS tablet TAKE 1 TABLET BY MOUTH EVERY DAY WITH SUPPER 30 tablet 12  . sucralfate (CARAFATE) 1 g tablet Take 1 tablet (1 g total) by mouth 4 (four) times daily -  with meals and at bedtime. 40 tablet 0  . thyroid (ARMOUR) 60 MG tablet Take 60 mg by mouth daily before breakfast.    . tiZANidine (ZANAFLEX) 4 MG tablet TAKE 1 TABLET BY MOUTH THREE TIMES A DAY AS NEEDED 90 tablet 1  . triamcinolone cream (KENALOG) 0.1 % 1 APPLICATION APPLY ON THE SKIN DAILY AS NEEDED  2  . vitamin C (ASCORBIC ACID) 250 MG tablet Take 250 mg by mouth daily.     No current facility-administered medications for this visit.      Past  Medical History:  Diagnosis Date  . A-fib ([place])   . Cancer ([place])    Melanoma  . Hearing loss   . Hypertension     Past Surgical History:  Procedure Laterality Date  . APPENDECTOMY    . CRANIECTOMY FOR EXCISION OF ACOUSTIC NEUROMA    . LEFT HEART CATH AND CORONARY ANGIOGRAPHY N/A 11/24/2016   Procedure: LEFT HEART CATH AND CORONARY ANGIOGRAPHY;  Surgeon: Jettie Booze, MD;  Location: [facility];  Service: Cardiovascular;  Laterality: N/A;  . PARTIAL HYSTERECTOMY    . TONSILLECTOMY      Social History   Socioeconomic History  . Marital status: Married    Spouse name: Not on file  . Number of children: Not on file  . Years of education: Not on file  . Highest education level: Not on file  Social Needs  . Financial resource  strain: Not on file  . Food insecurity - worry: Not on file  . Food insecurity - inability: Not on file  . Transportation needs - medical: Not on file  . Transportation needs - non-medical: Not on file  Occupational History  . Not on file  Tobacco Use  . Smoking status: Never Smoker  . Smokeless tobacco: Never Used  Substance and Sexual Activity  . Alcohol use: Yes  . Drug use: No  . Sexual activity: Not on file  Other Topics Concern  . Not on file  Social History Narrative  . Not on file    Family History  Problem Relation Age of Onset  . Congestive Heart Failure Mother   . Heart disease Father        History of heart attacks at a later age.      ROS: no fevers or chills, productive cough, hemoptysis, dysphasia, odynophagia, melena, hematochezia, dysuria, hematuria, rash, seizure activity, orthopnea, PND, pedal edema, claudication. Remaining systems are negative.  Physical Exam: Well-developed well-nourished in no acute distress.  Skin is warm and dry.  HEENT is normal.  Neck is supple.  Chest is clear to auscultation with normal expansion.  Cardiovascular exam is regular rate and rhythm.  Abdominal exam nontender or distended. No masses palpated. Extremities show no edema. neuro grossly intact  ECG- personally reviewed  A/P  1 paroxysmal atrial fibrillation-patient remains in sinus rhythm today.  Continue Cardizem for rate control if atrial fibrillation recurs.  Continue Xarelto.  We will consider the addition of an antiarrhythmic in the future if she has more frequent occurrences.  2 hypertension-blood pressure controlled.  Continue present medications.  3 hyperlipidemia-managed by primary care.  4 prior chest pain-previous catheterization revealed no coronary disease.  No further ischemia evaluation.  Kirk Ruths, MD

## 2017-06-03 NOTE — Telephone Encounter (Signed)
Fu as scheduled Cynthia [name]   

## 2017-06-10 ENCOUNTER — Ambulatory Visit: Payer: Medicare PPO | Admitting: Cardiology

## 2017-06-11 ENCOUNTER — Encounter: Payer: Self-pay | Admitting: Cardiology

## 2017-06-16 ENCOUNTER — Ambulatory Visit: Payer: Medicare PPO | Admitting: Adult Health

## 2017-07-08 ENCOUNTER — Other Ambulatory Visit: Payer: Self-pay | Admitting: Neurology

## 2017-08-04 ENCOUNTER — Other Ambulatory Visit: Payer: Self-pay | Admitting: Neurology

## 2017-08-11 ENCOUNTER — Other Ambulatory Visit: Payer: Self-pay

## 2017-08-11 ENCOUNTER — Emergency Department (HOSPITAL_COMMUNITY)
Admission: EM | Admit: 2017-08-11 | Discharge: 2017-08-12 | Disposition: A | Discharge status: Home / Self Care | Payer: Medicare PPO | Attending: Emergency Medicine | Admitting: Emergency Medicine

## 2017-08-11 DIAGNOSIS — Z79899 Other long term (current) drug therapy: Secondary | ICD-10-CM | POA: Diagnosis not present

## 2017-08-11 DIAGNOSIS — Z7901 Long term (current) use of anticoagulants: Secondary | ICD-10-CM | POA: Insufficient documentation

## 2017-08-11 DIAGNOSIS — I10 Essential (primary) hypertension: Secondary | ICD-10-CM | POA: Insufficient documentation

## 2017-08-11 DIAGNOSIS — E860 Dehydration: Secondary | ICD-10-CM | POA: Insufficient documentation

## 2017-08-11 DIAGNOSIS — R55 Syncope and collapse: Secondary | ICD-10-CM | POA: Insufficient documentation

## 2017-08-11 DIAGNOSIS — D509 Iron deficiency anemia, unspecified: Secondary | ICD-10-CM | POA: Diagnosis not present

## 2017-08-11 DIAGNOSIS — J069 Acute upper respiratory infection, unspecified: Secondary | ICD-10-CM | POA: Diagnosis not present

## 2017-08-11 LAB — BASIC METABOLIC PANEL
ANION GAP: 7 (ref 5–15)
BUN: 25 mg/dL — ABNORMAL HIGH (ref 6–20)
CALCIUM: 8.8 mg/dL — AB (ref 8.9–10.3)
CO2: 27 mmol/L (ref 22–32)
Chloride: 107 mmol/L (ref 101–111)
Creatinine, Ser: 0.94 mg/dL (ref 0.44–1.00)
GFR calc Af Amer: >60 mL/min (ref >60)
GFR calc non Af Amer: 58 mL/min — ABNORMAL LOW (ref >60)
GLUCOSE: 142 mg/dL — AB (ref 65–99)
Potassium: 4.3 mmol/L (ref 3.5–5.1)
Sodium: 141 mmol/L (ref 135–145)

## 2017-08-11 LAB — I-STAT CG4 LACTIC ACID, ED: Lactic Acid, Venous: 0.74 mmol/L (ref 0.5–1.9)

## 2017-08-11 LAB — CBC WITH DIFFERENTIAL/PLATELET
BASOS ABS: 0 10*3/uL (ref 0.0–0.1)
Basophils Relative: 1 %
Eosinophils Absolute: 0.1 10*3/uL (ref 0.0–0.7)
Eosinophils Relative: 2 %
HCT: 29.4 % — ABNORMAL LOW (ref 36.0–46.0)
Hemoglobin: 9.5 g/dL — ABNORMAL LOW (ref 12.0–15.0)
LYMPHS ABS: 1.2 10*3/uL (ref 0.7–4.0)
LYMPHS PCT: 28 %
MCH: 30.4 pg (ref 26.0–34.0)
MCHC: 32.3 g/dL (ref 30.0–36.0)
MCV: 93.9 fL (ref 78.0–100.0)
MONO ABS: 0.6 10*3/uL (ref 0.1–1.0)
Monocytes Relative: 14 %
NEUTROS ABS: 2.5 10*3/uL (ref 1.7–7.7)
Neutrophils Relative %: 55 %
Platelets: 144 10*3/uL — ABNORMAL LOW (ref 150–400)
RBC: 3.13 MIL/uL — ABNORMAL LOW (ref 3.87–5.11)
RDW: 13.1 % (ref 11.5–15.5)
WBC: 4.5 10*3/uL (ref 4.0–10.5)

## 2017-08-11 MED ORDER — SODIUM CHLORIDE 0.9 % IV BOLUS
1000.0000 mL | Freq: Once | INTRAVENOUS | Status: AC
  Administered 2017-08-11: 1000 mL via INTRAVENOUS

## 2017-08-11 NOTE — ED Triage Notes (Addendum)
Pt to ED via [facility] with c/o of near syncope episode this evening without a fall. Pt was able to lower herself to the ground at home in her living room.  Pt was started on antibiotics today for respiratory infection by her PCP. Pt was laid out in lounger and tried to get up and immediately felt diaphoretic and light headed to where she thought she was going to fall. Pt was up and off the floor when EMS arrived, but hypotensive 84/56. Pt does have Afib, but has been in NS for EMS. Pt stated to EMS that she has been taking cough medicine and a little bit of  "whiskey" for her cough that started 4 days ago.

## 2017-08-11 NOTE — ED Notes (Signed)
Bed: WA17 Expected date:  Expected time:  Means of arrival:  Comments: 77 yr old female syncopal episode

## 2017-08-12 NOTE — Discharge Instructions (Addendum)
Your weakness today was likely related to being dehydrated.  Try to increase your oral fluid intake especially water by drinking 1 to 2 L each day.  Your hemoglobin level was low today at 9.5.  This is somewhat lower than prior laboratory checks.  To improve the anemia, try taking 1 iron tablet, 325 mg, each day for 1 month.  This should improve your hemoglobin level by about 1 g/week.  To make sure you are getting better, follow-up with your primary care doctor in 1 week for a checkup and repeat laboratory testing.  Return here, if needed, for problems.

## 2017-08-12 NOTE — ED Notes (Signed)
Pt ambulatory to the bathroom with no assistance. Pt had no complaints of dizziness or feeling light headed while walking

## 2017-08-12 NOTE — ED Notes (Signed)
Patient ambulated fine with no complaints.

## 2017-08-12 NOTE — ED Provider Notes (Signed)
[place] DEPT Provider Note   CSN: [id number] Arrival date & time: 08/11/17  2217     History   Chief Complaint Chief Complaint  Patient presents with  . Near Syncope    HPI Cynthia [name] is a 77 y.o. female.  HPI   She presents for evaluation of near syncope that occurred this evening, when she was at home.  She recalls standing up feeling dizzy and going to floor.  She did not fall but eased herself down.  She saw her PCP earlier today for evaluation of an ongoing illness characterized by nasal congestion, whereupon she was treated with an antibiotic that she describes as "amoxicillin."  She had some vomiting several days ago but has not had diarrhea.  She denies fever, chills, cough, focal weakness or paresthesia.  She states that she is been around several people who have been ill.  There are no other known modifying factors.  Past Medical History:  Diagnosis Date  . A-fib ([place])   . Cancer ([place])    Melanoma  . Hearing loss   . Hypertension     Patient Active Problem List   Diagnosis Date Noted  . Abnormal stress test   . Chest pain 11/23/2016  . Hyperlipidemia 11/23/2016  . GERD (gastroesophageal reflux disease) 11/23/2016  . History of recurrent UTIs 11/23/2016  . Polyneuropathy 10/30/2016  . Bursitis, subacromial 10/30/2016  . Numbness 10/30/2016  . Arthritis, senescent 01/03/2016  . Left knee pain 01/03/2016  . Pain in joint, ankle and foot 02/28/2015  . Dry mouth 08/07/2014  . Dry eyes 08/07/2014  . Other headache syndrome 06/25/2014  . Sinusitis, chronic 06/25/2014  . Neck pain 06/25/2014  . OSA (obstructive sleep apnea) 06/25/2014  . Essential hypertension, benign 06/25/2014    Past Surgical History:  Procedure Laterality Date  . APPENDECTOMY    . CRANIECTOMY FOR EXCISION OF ACOUSTIC NEUROMA    . LEFT HEART CATH AND CORONARY ANGIOGRAPHY N/A 11/24/2016   Procedure: LEFT HEART CATH AND CORONARY ANGIOGRAPHY;  Surgeon:  Jettie Booze, MD;  Location: [facility];  Service: Cardiovascular;  Laterality: N/A;  . PARTIAL HYSTERECTOMY    . TONSILLECTOMY       OB History   None      Home Medications    Prior to Admission medications   Medication Sig Start Date End Date Taking? Authorizing Provider  acetaminophen (TYLENOL) 500 MG tablet Take 1,000 mg by mouth daily.   Yes [provider]  Alpha-Lipoic Acid (LIPOIC ACID PO) Take 2 tablets by mouth 2 (two) times daily.   Yes [provider]  amoxicillin-clavulanate (AUGMENTIN) 500-125 MG tablet Take 1 tablet by mouth 2 (two) times daily.   Yes [provider]  atorvastatin (LIPITOR) 10 MG tablet TAKE 1 TABLET BY MOUTH EVERY DAY AT 6 PM 12/16/16  Yes Lelon Perla, MD  calcium carbonate 1250 MG capsule Take 1,250 mg by mouth at bedtime. Reported on 07/09/2015   Yes [provider]  cephALEXin (KEFLEX) 250 MG capsule Take 250 mg by mouth at bedtime.  06/22/14  Yes [provider]  cholecalciferol (VITAMIN D) 1000 UNITS tablet Take 1,000 Units by mouth daily.   Yes [provider]  CRANBERRY CONCENTRATE PO Take 1 capsule by mouth daily.   Yes [provider]  cycloSPORINE (RESTASIS) 0.05 % ophthalmic emulsion Place 1 drop into both eyes 2 (two) times daily.   Yes [provider]  diltiazem (CARDIZEM CD)  120 MG 24 hr capsule Take 1 capsule (120 mg total) by mouth daily. 12/16/16  Yes Lelon Perla, MD  DULoxetine (CYMBALTA) 30 MG capsule TAKE 1 CAPSULE (30 MG TOTAL) BY MOUTH 2 (TWO) TIMES DAILY. 07/09/17  Yes Sater, Nanine Means, MD  fluticasone (FLONASE) 50 MCG/ACT nasal spray Place 2 sprays into both nostrils at bedtime.   Yes [provider]  glucosamine-chondroitin 500-400 MG tablet Take 2 tablets by mouth daily with lunch.   Yes [provider]  ipratropium (ATROVENT) 0.06 % nasal spray 2 DROPS IN EACH NOSTRIL DAILY AT BEDTIME 07/12/15  Yes [provider]  losartan (COZAAR) 100 MG tablet Take 1 tablet (100 mg total) by mouth daily. Patient taking differently: Take 50-100 mg by mouth daily. > 140 Take 1 tablet & < 140 Tablet 1/2 tablet 03/09/17  Yes Crenshaw, Denice Bors, MD  Meth-Hyo-M Barnett Hatter Phos-Ph Sal (URIBEL) 118 MG CAPS Take 1 capsule by mouth daily as needed (uti).  01/25/15  Yes [provider]  Multiple Vitamin (MULTIVITAMIN WITH MINERALS) TABS tablet Take 1 tablet by mouth daily.   Yes [provider]  multivitamin-lutein (OCUVITE-LUTEIN) CAPS capsule Take 1 capsule by mouth daily with lunch.   Yes [provider]  omega-3 acid ethyl esters (LOVAZA) 1 G capsule Take 1 g by mouth 2 (two) times daily.   Yes [provider]  oxybutynin (DITROPAN-XL) 10 MG 24 hr tablet Take 10 mg by mouth at bedtime.  10/17/16  Yes [provider]  pantoprazole (PROTONIX) 40 MG tablet Take 40 mg by mouth daily after breakfast.  06/12/14  Yes [provider]  ranitidine (ZANTAC) 150 MG tablet Take 150 mg by mouth 2 (two) times daily. 07/17/17  Yes [provider]  rivaroxaban (XARELTO) 20 MG TABS tablet TAKE 1 TABLET BY MOUTH EVERY DAY WITH SUPPER 12/16/16  Yes Crenshaw, Denice Bors, MD  simethicone (MYLICON) 80 MG chewable tablet Chew 80 mg by mouth 3 (three) times daily.   Yes [provider]  spironolactone (ALDACTONE) 25 MG tablet TAKE 1 TABLET BY MOUTH EVERY DAY IN THE MORNING 07/21/17  Yes [provider]  sucralfate (CARAFATE) 1 g tablet Take 1 tablet (1 g total) by mouth 4 (four) times daily -  with meals and at bedtime. Patient taking differently: Take 1 g by mouth 3 (three) times daily.  12/22/16  Yes Duffy Bruce, MD  thyroid (ARMOUR) 60 MG tablet Take 60 mg by mouth daily before breakfast.   Yes [provider]  tiZANidine (ZANAFLEX) 4 MG tablet TAKE 1 TABLET BY MOUTH THREE TIMES A DAY AS NEEDED Patient taking differently: TAKE 1 TABLET BY MOUTH THREE TIMES A DAY AS NEEDED  SPASMS 08/04/17  Yes Sater, Nanine Means, MD  trimethoprim (TRIMPEX) 100 MG tablet Take 100 mg by mouth daily. 07/16/17  Yes [provider]  vitamin C (ASCORBIC ACID) 250 MG tablet Take 250 mg by mouth daily.   Yes [provider]  ranitidine (ZANTAC) 150 MG tablet Take 1 tablet (150 mg total) by mouth 2 (two) times daily. 12/22/16 01/01/17  Duffy Bruce, MD    Family History Family History  Problem Relation Age of Onset  . Congestive Heart Failure Mother   . Heart disease Father        History of heart attacks at a later age.      Social History Social History   Tobacco Use  . Smoking status: Never Smoker  . Smokeless tobacco: Never Used  Substance Use Topics  . Alcohol use: Yes  . Drug use: No     Allergies   Patient has no known allergies.   Review of Systems Review of Systems  All other systems reviewed and are negative.    Physical Exam Updated Vital Signs BP 120/69   Pulse 72   Temp 98.2 F (36.8 C) (Rectal)   Resp 15   Ht 5\' 5"  (1.651 m)   Wt 68.9 kg (152 lb)   SpO2 96%   BMI 25.29 kg/m   Physical Exam  Constitutional: She is oriented to person, place, and time. She appears well-developed and well-nourished.  HENT:  Head: Normocephalic and atraumatic.  Eyes: Pupils are equal, round, and reactive to light. Conjunctivae and EOM are normal.  Neck: Normal range of motion and phonation normal. Neck supple.  Cardiovascular: Normal rate and regular rhythm.  Pulmonary/Chest: Effort normal and breath sounds normal. She exhibits no tenderness.  Abdominal: Soft. She exhibits no distension. There is no tenderness. There is no guarding.  Musculoskeletal: Normal range of motion.  Neurological: She is alert and oriented to person, place, and time. She exhibits normal muscle tone.  No dysarthria or aphasia.  Skin: Skin is warm and dry.  Psychiatric: She has a normal mood and affect. Her behavior is normal. Judgment and thought content normal.    Nursing note and vitals reviewed.    ED Treatments / Results  Labs (all labs ordered are listed, but only abnormal results are displayed) Labs Reviewed  CBC WITH DIFFERENTIAL/PLATELET - Abnormal; Notable for the following components:      Result Value   RBC 3.13 (*)    Hemoglobin 9.5 (*)    HCT 29.4 (*)    Platelets 144 (*)    All other components within normal limits  BASIC METABOLIC PANEL - Abnormal; Notable for the following components:   Glucose, Bld 142 (*)    BUN 25 (*)    Calcium 8.8 (*)    GFR calc non Af Amer 58 (*)    All other components within normal limits  I-STAT CG4 LACTIC ACID, ED    EKG EKG Interpretation  Date/Time:  Wednesday Aug 11 2017 22:24:49 EDT Ventricular Rate:  71 PR Interval:    QRS Duration: 105 QT Interval:  431 QTC Calculation: 469 R Axis:   -52 Text Interpretation:  Sinus rhythm LAD, consider left anterior fascicular block Low voltage, precordial leads since last tracing no significant change Confirmed by Daleen Bo [id number]) on 08/11/2017 11:51:34 PM   Radiology No results found.  Procedures Procedures (including critical care time)  Medications Ordered in ED Medications  sodium chloride 0.9 % bolus 1,000 mL (0 mLs Intravenous Stopped 08/11/17 2329)     Initial Impression / Assessment and Plan / ED Course  I have reviewed the triage vital signs and the nursing notes.  Pertinent labs & imaging results that were available during my care of the patient were reviewed by me and considered in my medical decision making (see chart for details).  Clinical Course as of Aug 13 135  Thu Aug 12, 2017  0123 Normal  I-Stat CG4 Lactic Acid, ED [EW]  0124 Normal except elevated glucose, BUN.  And low calcium.  Basic metabolic panel(!) [EW]  2119 Normal except low hemoglobin 9.5.  CBC with Differential(!) [EW]  0130 Patient states she is feeling better at this time after IV fluids.  Blood pressure is now normal.  We discussed her low  hemoglobin and she  states that she is "always anemic."  She denies dark stool.   [EW]    Clinical Course User Index [EW] Daleen Bo, MD     Patient Vitals for the past 24 hrs:  BP Temp Temp src Pulse Resp SpO2 Height Weight  08/12/17 0044 120/69 - - 72 15 96 % - -  08/11/17 2331 - 98.2 F (36.8 C) Rectal - - - - -  08/11/17 2228 - - - - - - 5\' 5"  (1.651 m) 68.9 kg (152 lb)  08/11/17 2227 97/66 98.1 F (36.7 C) Oral 72 20 97 % - -  08/11/17 2221 - - - - - 98 % - -    1:33 AM Reevaluation with update and discussion. After initial assessment and treatment, an updated evaluation reveals patient is comfortable now, findings discussed and questions answered. [place] decision making-near syncope likely related to decreased oral intake, improved with IV fluids.  Incidental anemia, which is apparently recurrent but somewhat lower than baseline.  Suspect that this is nutritional related.  No visualized blood loss by the patient.  No indication for further evaluation or treatment in the ED or hospital setting at this time.  Nursing Notes Reviewed/ Care Coordinated Applicable Imaging Reviewed Interpretation of Laboratory Data incorporated into ED treatment  The patient appears reasonably screened and/or stabilized for discharge and I doubt any other medical condition or other [facility] requiring further screening, evaluation, or treatment in the ED at this time prior to discharge.  Plan: Home Medications-continue usual medications and take iron, 325 mg once a day for 1 month; Home Treatments-gradually advance diet, and push oral fluids; return here if the recommended treatment, does not improve the symptoms; Recommended follow up-PCP follow-up 1 week for checkup and evaluation of anemia.    Final Clinical Impressions(s) / ED Diagnoses   Final diagnoses:  Near syncope  Dehydration  Iron deficiency anemia, unspecified iron deficiency anemia type    ED Discharge Orders     None       Daleen Bo, MD 08/12/17 [phone]

## 2017-08-19 DIAGNOSIS — D649 Anemia, unspecified: Secondary | ICD-10-CM | POA: Diagnosis not present

## 2017-08-24 DIAGNOSIS — D649 Anemia, unspecified: Secondary | ICD-10-CM | POA: Diagnosis not present

## 2017-08-24 DIAGNOSIS — R413 Other amnesia: Secondary | ICD-10-CM | POA: Diagnosis not present

## 2017-09-09 ENCOUNTER — Other Ambulatory Visit: Payer: Self-pay | Admitting: Neurology

## 2017-10-07 ENCOUNTER — Ambulatory Visit: Payer: Medicare PPO | Admitting: Neurology

## 2017-10-11 DIAGNOSIS — Z9289 Personal history of other medical treatment: Secondary | ICD-10-CM

## 2017-10-11 HISTORY — DX: Personal history of other medical treatment: Z92.89

## 2017-10-20 ENCOUNTER — Ambulatory Visit: Payer: Medicare PPO | Admitting: Neurology

## 2017-10-20 ENCOUNTER — Encounter: Payer: Self-pay | Admitting: Neurology

## 2017-10-27 ENCOUNTER — Encounter (HOSPITAL_COMMUNITY): Payer: Self-pay | Admitting: Emergency Medicine

## 2017-10-27 ENCOUNTER — Observation Stay (HOSPITAL_COMMUNITY)
Admission: EM | Admit: 2017-10-27 | Discharge: 2017-10-29 | Disposition: A | Discharge status: Home / Self Care | Payer: Medicare PPO | Attending: Family Medicine | Admitting: Family Medicine

## 2017-10-27 ENCOUNTER — Other Ambulatory Visit: Payer: Self-pay

## 2017-10-27 DIAGNOSIS — D62 Acute posthemorrhagic anemia: Secondary | ICD-10-CM

## 2017-10-27 DIAGNOSIS — Z7901 Long term (current) use of anticoagulants: Secondary | ICD-10-CM | POA: Diagnosis present

## 2017-10-27 DIAGNOSIS — I10 Essential (primary) hypertension: Secondary | ICD-10-CM | POA: Insufficient documentation

## 2017-10-27 DIAGNOSIS — E785 Hyperlipidemia, unspecified: Secondary | ICD-10-CM | POA: Diagnosis not present

## 2017-10-27 DIAGNOSIS — Z7902 Long term (current) use of antithrombotics/antiplatelets: Secondary | ICD-10-CM | POA: Insufficient documentation

## 2017-10-27 DIAGNOSIS — Z9989 Dependence on other enabling machines and devices: Secondary | ICD-10-CM | POA: Diagnosis present

## 2017-10-27 DIAGNOSIS — K449 Diaphragmatic hernia without obstruction or gangrene: Secondary | ICD-10-CM | POA: Insufficient documentation

## 2017-10-27 DIAGNOSIS — G4733 Obstructive sleep apnea (adult) (pediatric): Secondary | ICD-10-CM | POA: Insufficient documentation

## 2017-10-27 DIAGNOSIS — K922 Gastrointestinal hemorrhage, unspecified: Secondary | ICD-10-CM | POA: Diagnosis present

## 2017-10-27 DIAGNOSIS — N390 Urinary tract infection, site not specified: Secondary | ICD-10-CM | POA: Diagnosis not present

## 2017-10-27 DIAGNOSIS — Z79899 Other long term (current) drug therapy: Secondary | ICD-10-CM | POA: Insufficient documentation

## 2017-10-27 DIAGNOSIS — B9620 Unspecified Escherichia coli [E. coli] as the cause of diseases classified elsewhere: Secondary | ICD-10-CM | POA: Diagnosis not present

## 2017-10-27 DIAGNOSIS — I4891 Unspecified atrial fibrillation: Secondary | ICD-10-CM | POA: Diagnosis not present

## 2017-10-27 DIAGNOSIS — E039 Hypothyroidism, unspecified: Secondary | ICD-10-CM | POA: Insufficient documentation

## 2017-10-27 DIAGNOSIS — K921 Melena: Secondary | ICD-10-CM

## 2017-10-27 HISTORY — DX: Acute posthemorrhagic anemia: D62

## 2017-10-27 HISTORY — DX: Gastrointestinal hemorrhage, unspecified: K92.2

## 2017-10-27 LAB — COMPREHENSIVE METABOLIC PANEL
ALBUMIN: 3.8 g/dL (ref 3.5–5.0)
ALT: 16 U/L (ref 0–44)
AST: 21 U/L (ref 15–41)
Alkaline Phosphatase: 48 U/L (ref 38–126)
Anion gap: 6 (ref 5–15)
BILIRUBIN TOTAL: 0.3 mg/dL (ref 0.3–1.2)
BUN: 38 mg/dL — AB (ref 8–23)
CALCIUM: 9.2 mg/dL (ref 8.9–10.3)
CO2: 28 mmol/L (ref 22–32)
CREATININE: 0.86 mg/dL (ref 0.44–1.00)
Chloride: 110 mmol/L (ref 98–111)
GFR calc Af Amer: >60 mL/min (ref >60)
GLUCOSE: 114 mg/dL — AB (ref 70–99)
POTASSIUM: 4.3 mmol/L (ref 3.5–5.1)
Sodium: 144 mmol/L (ref 135–145)
TOTAL PROTEIN: 6.5 g/dL (ref 6.5–8.1)

## 2017-10-27 LAB — CBC
HCT: 26.2 % — ABNORMAL LOW (ref 36.0–46.0)
HEMATOCRIT: 27.3 % — AB (ref 36.0–46.0)
Hemoglobin: 9.1 g/dL — ABNORMAL LOW (ref 12.0–15.0)
Hemoglobin: 8.5 g/dL — ABNORMAL LOW (ref 12.0–15.0)
MCH: 31.6 pg (ref 26.0–34.0)
MCH: 30.9 pg (ref 26.0–34.0)
MCHC: 33.3 g/dL (ref 30.0–36.0)
MCHC: 32.4 g/dL (ref 30.0–36.0)
MCV: 94.8 fL (ref 78.0–100.0)
MCV: 95.3 fL (ref 78.0–100.0)
PLATELETS: 224 10*3/uL (ref 150–400)
Platelets: 246 10*3/uL (ref 150–400)
RBC: 2.88 MIL/uL — ABNORMAL LOW (ref 3.87–5.11)
RBC: 2.75 MIL/uL — ABNORMAL LOW (ref 3.87–5.11)
RDW: 13.2 % (ref 11.5–15.5)
RDW: 13.3 % (ref 11.5–15.5)
WBC: 6.8 10*3/uL (ref 4.0–10.5)
WBC: 7.1 10*3/uL (ref 4.0–10.5)

## 2017-10-27 LAB — TYPE AND SCREEN
ABO/RH(D): A POS
ANTIBODY SCREEN: NEGATIVE

## 2017-10-27 LAB — POC OCCULT BLOOD, ED: Fecal Occult Bld: POSITIVE — AB

## 2017-10-27 LAB — PROTIME-INR
INR: 1.5
Prothrombin Time: 18 seconds — ABNORMAL HIGH (ref 11.4–15.2)

## 2017-10-27 MED ORDER — ONDANSETRON HCL 4 MG/2ML IJ SOLN
4.0000 mg | Freq: Four times a day (QID) | INTRAMUSCULAR | Status: DC | PRN
  Administered 2017-10-28: 4 mg via INTRAVENOUS
  Filled 2017-10-27: qty 2

## 2017-10-27 MED ORDER — ACETAMINOPHEN 325 MG PO TABS
650.0000 mg | ORAL_TABLET | Freq: Four times a day (QID) | ORAL | Status: DC | PRN
  Administered 2017-10-29: 650 mg via ORAL
  Filled 2017-10-27: qty 2

## 2017-10-27 MED ORDER — CYCLOSPORINE 0.05 % OP EMUL
1.0000 [drp] | Freq: Two times a day (BID) | OPHTHALMIC | Status: DC
  Administered 2017-10-28 (×2): 1 [drp] via OPHTHALMIC
  Filled 2017-10-27 (×3): qty 1

## 2017-10-27 MED ORDER — DILTIAZEM HCL ER COATED BEADS 120 MG PO CP24
120.0000 mg | ORAL_CAPSULE | Freq: Every day | ORAL | Status: DC
  Administered 2017-10-28: 120 mg via ORAL
  Filled 2017-10-27 (×2): qty 1

## 2017-10-27 MED ORDER — ONDANSETRON HCL 4 MG PO TABS: 4.0000 mg | ORAL_TABLET | Freq: Four times a day (QID) | ORAL | Status: DC | PRN

## 2017-10-27 MED ORDER — SODIUM CHLORIDE 0.9 % IV SOLN
80.0000 mg | Freq: Once | INTRAVENOUS | Status: AC
  Administered 2017-10-27: 80 mg via INTRAVENOUS
  Filled 2017-10-27: qty 80

## 2017-10-27 MED ORDER — ACETAMINOPHEN 650 MG RE SUPP: 650.0000 mg | Freq: Four times a day (QID) | RECTAL | Status: DC | PRN

## 2017-10-27 MED ORDER — SODIUM CHLORIDE 0.9 % IV SOLN
8.0000 mg/h | INTRAVENOUS | Status: DC
  Administered 2017-10-28 (×2): 8 mg/h via INTRAVENOUS
  Filled 2017-10-27 (×4): qty 80

## 2017-10-27 NOTE — H&P (Signed)
History and Physical    Promise Weldin NLZ:[id number] DOB: 1941/02/04 DOA: 10/27/2017  PCP: Deland Pretty, MD  Patient coming from: Home.  Chief Complaint: GI bleed.  HPI: Cynthia [name] is a 77 y.o. female with history of atrial fibrillation on Xarelto, hypertension, diastolic dysfunction, hyperlipidemia presents to the ER due to GI bleed.  Patient states over the last 2 weeks patient has been noticing black stools on bowel movement.  Today she noticed some blood on moving her bowels and wiping.  Has been examined no increasing abdominal discomfort around the periumbilical area denies any vomiting.  Patient states she has been recently taking Aleve for arthritis pain.  Patient did feel some dizziness today on standing.  Denies any chest pain or shortness of breath.  ED Course: In the ER patient's hemoglobin is around 9.  Patient was started on Protonix infusion for GI bleed likely from upper GI source.  Patient states she had a recent colonoscopy by Dr. Watt Climes around 5 years ago which was unremarkable.  Review of Systems: As per HPI, rest all negative.   Past Medical History:  Diagnosis Date  . A-fib ([place])   . Cancer ([place])    Melanoma  . Hearing loss   . Hypertension     Past Surgical History:  Procedure Laterality Date  . APPENDECTOMY    . CRANIECTOMY FOR EXCISION OF ACOUSTIC NEUROMA    . LEFT HEART CATH AND CORONARY ANGIOGRAPHY N/A 11/24/2016   Procedure: LEFT HEART CATH AND CORONARY ANGIOGRAPHY;  Surgeon: Jettie Booze, MD;  Location: [place] [facility];  Service: Cardiovascular;  Laterality: N/A;  . PARTIAL HYSTERECTOMY    . TONSILLECTOMY       reports that she has never smoked. She has never used smokeless tobacco. She reports that she drinks alcohol. She reports that she does not use drugs.  No Known Allergies  Family History  Problem Relation Age of Onset  . Congestive Heart Failure Mother   . Heart disease Father        History of heart attacks at a later  age.      Prior to Admission medications   Medication Sig Start Date End Date Taking? Authorizing Provider  Alpha-Lipoic Acid (LIPOIC ACID PO) Take 2 tablets by mouth 2 (two) times daily.   Yes [provider]  atorvastatin (LIPITOR) 10 MG tablet TAKE 1 TABLET BY MOUTH EVERY DAY AT 6 PM 12/16/16  Yes Crenshaw, Denice Bors, MD  CALCIUM PO Take 1 tablet by mouth 2 (two) times daily.   Yes [provider]  cholecalciferol (VITAMIN D) 1000 UNITS tablet Take 1,000 Units by mouth daily.   Yes [provider]  CRANBERRY CONCENTRATE PO Take 1 capsule by mouth daily.   Yes [provider]  cycloSPORINE (RESTASIS) 0.05 % ophthalmic emulsion Place 1 drop into both eyes 2 (two) times daily.   Yes [provider]  diltiazem (CARDIZEM CD) 120 MG 24 hr capsule Take 1 capsule (120 mg total) by mouth daily. 12/16/16  Yes Lelon Perla, MD  DULoxetine (CYMBALTA) 30 MG capsule TAKE 1 CAPSULE (30 MG TOTAL) BY MOUTH 2 (TWO) TIMES DAILY. 09/09/17  Yes Sater, Nanine Means, MD  fluocinonide cream (LIDEX) 0.05 % APPLY ON THE SKIN TWICE A DAY X 2 WEEKS, THEN 1 WEEK OFF AND REPEAT 10/08/17  Yes [provider]  fluticasone (FLONASE) 50 MCG/ACT nasal spray Place 2 sprays into both nostrils at bedtime.   Yes [provider]  glucosamine-chondroitin 500-400 MG tablet Take 2 tablets by mouth daily with lunch.   Yes [provider]  losartan (COZAAR) 100 MG tablet Take 1 tablet (100 mg total) by mouth daily. Patient taking differently: Take 50-100 mg by mouth daily. > 140 Take 1 tablet & < 140 Tablet 1/2 tablet 03/09/17  Yes Crenshaw, Denice Bors, MD  Meth-Hyo-M Barnett Hatter Phos-Ph Sal (URIBEL) 118 MG CAPS Take 1 capsule by mouth daily as needed (uti).  01/25/15  Yes [provider]  Multiple Vitamin (MULTIVITAMIN WITH MINERALS) TABS tablet Take 1 tablet by mouth daily.   Yes [provider]  multivitamin-lutein (OCUVITE-LUTEIN) CAPS capsule Take 1 capsule  by mouth daily with lunch.   Yes [provider]  pantoprazole (PROTONIX) 40 MG tablet Take 40 mg by mouth daily before breakfast.  06/12/14  Yes [provider]  ranitidine (ZANTAC) 150 MG tablet Take 1 tablet (150 mg total) by mouth 2 (two) times daily. 12/22/16 10/27/17 Yes Duffy Bruce, MD  rivaroxaban (XARELTO) 20 MG TABS tablet TAKE 1 TABLET BY MOUTH EVERY DAY WITH SUPPER 12/16/16  Yes Lelon Perla, MD  simethicone (MYLICON) 616 MG chewable tablet Chew 125 mg by mouth 2 (two) times daily as needed for flatulence.   Yes [provider]  spironolactone (ALDACTONE) 25 MG tablet TAKE 1 TABLET BY MOUTH EVERY DAY IN THE MORNING 07/21/17  Yes [provider]  sucralfate (CARAFATE) 1 g tablet Take 1 tablet (1 g total) by mouth 4 (four) times daily -  with meals and at bedtime. Patient taking differently: Take 1 g by mouth 3 (three) times daily.  12/22/16  Yes Duffy Bruce, MD  thyroid (ARMOUR) 60 MG tablet Take 60 mg by mouth daily before breakfast.   Yes [provider]  tiZANidine (ZANAFLEX) 4 MG tablet TAKE 1 TABLET BY MOUTH THREE TIMES A DAY AS NEEDED Patient taking differently: TAKE 1 TABLET BY MOUTH THREE TIMES A DAY AS NEEDED SPASMS 08/04/17  Yes Sater, Nanine Means, MD  trimethoprim (TRIMPEX) 100 MG tablet Take 100 mg by mouth daily. 07/16/17  Yes [provider]  vitamin C (ASCORBIC ACID) 250 MG tablet Take 250 mg by mouth daily.   Yes [provider]    Physical Exam: Vitals:   10/27/17 1526 10/27/17 1817  BP: (!) 105/57 (!) 141/64  Pulse: 82 79  Resp: 18 18  Temp: 98.1 F (36.7 C) (!) 78 F (25.6 C)  TempSrc: Oral Oral  SpO2: 98% 98%      Constitutional: Moderately built and nourished. Vitals:   10/27/17 1526 10/27/17 1817  BP: (!) 105/57 (!) 141/64  Pulse: 82 79  Resp: 18 18  Temp: 98.1 F (36.7 C) (!) 78 F (25.6 C)  TempSrc: Oral Oral  SpO2: 98% 98%   Eyes: Anicteric no pallor. ENMT: No discharge from  the ears eyes nose or mouth.  Neck: No mass felt.  No neck rigidity.  No JVD appreciated. Respiratory: No rhonchi or crepitations. Cardiovascular: S1-S2 heard no murmurs appreciated. Abdomen: Soft nontender bowel sounds present. Musculoskeletal: No edema.  No joint effusion. Skin: No rash. Neurologic: Alert awake oriented to time place and person.  Moves all extremities.  Psychiatric: Appears normal.  Normal affect.   Labs on Admission: I have personally reviewed following labs and imaging studies  CBC: Recent Labs  Lab 10/27/17 2010  WBC 6.8  HGB 9.1*  HCT 27.3*  MCV 94.8  PLT 073   Basic Metabolic Panel: Recent Labs  Lab  10/27/17 2010  NA 144  K 4.3  CL 110  CO2 28  GLUCOSE 114*  BUN 38*  CREATININE 0.86  CALCIUM 9.2   GFR: CrCl cannot be calculated (Unknown ideal weight.). Liver Function Tests: Recent Labs  Lab 10/27/17 2010  AST 21  ALT 16  ALKPHOS 48  BILITOT 0.3  PROT 6.5  ALBUMIN 3.8   No results for input(s): LIPASE, AMYLASE in the last 168 hours. No results for input(s): AMMONIA in the last 168 hours. Coagulation Profile: No results for input(s): INR, PROTIME in the last 168 hours. Cardiac Enzymes: No results for input(s): CKTOTAL, CKMB, CKMBINDEX, TROPONINI in the last 168 hours. BNP (last 3 results) No results for input(s): PROBNP in the last 8760 hours. HbA1C: No results for input(s): HGBA1C in the last 72 hours. CBG: No results for input(s): GLUCAP in the last 168 hours. Lipid Profile: No results for input(s): CHOL, HDL, LDLCALC, TRIG, CHOLHDL, LDLDIRECT in the last 72 hours. Thyroid Function Tests: No results for input(s): TSH, T4TOTAL, FREET4, T3FREE, THYROIDAB in the last 72 hours. Anemia Panel: No results for input(s): VITAMINB12, FOLATE, FERRITIN, TIBC, IRON, RETICCTPCT in the last 72 hours. Urine analysis:    Component Value Date/Time   COLORURINE YELLOW 12/22/2016 0858   APPEARANCEUR CLEAR 12/22/2016 0858   LABSPEC 1.008  12/22/2016 0858   PHURINE 7.0 12/22/2016 0858   GLUCOSEU NEGATIVE 12/22/2016 0858   HGBUR NEGATIVE 12/22/2016 0858   BILIRUBINUR NEGATIVE 12/22/2016 0858   KETONESUR NEGATIVE 12/22/2016 0858   PROTEINUR NEGATIVE 12/22/2016 0858   NITRITE NEGATIVE 12/22/2016 0858   LEUKOCYTESUR LARGE (A) 12/22/2016 0858   Sepsis Labs: @LABRCNTIP (procalcitonin:4,lacticidven:4) )No results found for this or any previous visit (from the past 240 hour(s)).   Radiological Exams on Admission: No results found.    Assessment/Plan Principal Problem:   Acute GI bleeding Active Problems:   OSA (obstructive sleep apnea)   Essential hypertension, benign   Acute blood loss anemia    1. Acute GI bleeding -given that patient stool is melanotic likely could be upper GI source that patient also was taking Aleve.  Will keep patient n.p.o. and continue Protonix infusion check serial CBCs and consult patient's gastroenterologist Dr. Watt Climes in a.m. 2. Anemia appears to be chronic.  Closely follow CBC. 3. Atrial fibrillation on Cardizem and Xarelto.  Xarelto on hold due to acute GI bleed.  Continue Cardizem if blood pressure allows. 4. Hypertension on Cozaar and Cardizem.  Holding off Cozaar and keeping patient on PRN IV hydralazine. 5. Hyperlipidemia on statins. 6. Hypothyroidism continue [place] Thyroid once patient can take orally.   DVT prophylaxis: SCDs. Code Status: Full code. Family Communication: Patient's husband and daughter. Disposition Plan: Home. Consults called: None. Admission status: Inpatient.   Rise Patience MD [facility] Pager [phone].  If 7PM-7AM, please contact night-coverage [email or web address] Password [facility]  10/27/2017, 9:27 PM

## 2017-10-27 NOTE — ED Provider Notes (Signed)
[facility] DEPT Provider Note   CSN: [id number] Arrival date & time: 10/27/17  1456     History   Chief Complaint Chief Complaint  Patient presents with  . Melena    HPI Cynthia [name] is a 77 y.o. female.  HPI Patient notes over several days her stool has been appearing darker and darker.  Patient does take Xarelto for atrial fibrillation.  She also notes as of yesterday evening and today she is getting dizzy with position change in activity.  She denies abdominal pain but does note that she has had a lot of intense gurgling and gas.  No vomiting.  No chest pain.  No shortness of breath. Past Medical History:  Diagnosis Date  . A-fib ([place])   . Cancer ([place])    Melanoma  . Hearing loss   . Hypertension     Patient Active Problem List   Diagnosis Date Noted  . Acute GI bleeding 10/27/2017  . Acute blood loss anemia 10/27/2017  . Abnormal stress test   . Chest pain 11/23/2016  . Hyperlipidemia 11/23/2016  . GERD (gastroesophageal reflux disease) 11/23/2016  . History of recurrent UTIs 11/23/2016  . Polyneuropathy 10/30/2016  . Bursitis, subacromial 10/30/2016  . Numbness 10/30/2016  . Arthritis, senescent 01/03/2016  . Left knee pain 01/03/2016  . Pain in joint, ankle and foot 02/28/2015  . Dry mouth 08/07/2014  . Dry eyes 08/07/2014  . Other headache syndrome 06/25/2014  . Sinusitis, chronic 06/25/2014  . Neck pain 06/25/2014  . OSA (obstructive sleep apnea) 06/25/2014  . Essential hypertension, benign 06/25/2014    Past Surgical History:  Procedure Laterality Date  . APPENDECTOMY    . CRANIECTOMY FOR EXCISION OF ACOUSTIC NEUROMA    . LEFT HEART CATH AND CORONARY ANGIOGRAPHY N/A 11/24/2016   Procedure: LEFT HEART CATH AND CORONARY ANGIOGRAPHY;  Surgeon: Jettie Booze, MD;  Location: [facility];  Service: Cardiovascular;  Laterality: N/A;  . PARTIAL HYSTERECTOMY    . TONSILLECTOMY       OB History   None       Home Medications    Prior to Admission medications   Medication Sig Start Date End Date Taking? Authorizing Provider  Alpha-Lipoic Acid (LIPOIC ACID PO) Take 2 tablets by mouth 2 (two) times daily.   Yes [provider]  atorvastatin (LIPITOR) 10 MG tablet TAKE 1 TABLET BY MOUTH EVERY DAY AT 6 PM 12/16/16  Yes Crenshaw, Denice Bors, MD  CALCIUM PO Take 1 tablet by mouth 2 (two) times daily.   Yes [provider]  cholecalciferol (VITAMIN D) 1000 UNITS tablet Take 1,000 Units by mouth daily.   Yes [provider]  CRANBERRY CONCENTRATE PO Take 1 capsule by mouth daily.   Yes [provider]  cycloSPORINE (RESTASIS) 0.05 % ophthalmic emulsion Place 1 drop into both eyes 2 (two) times daily.   Yes [provider]  diltiazem (CARDIZEM CD) 120 MG 24 hr capsule Take 1 capsule (120 mg total) by mouth daily. 12/16/16  Yes Lelon Perla, MD  DULoxetine (CYMBALTA) 30 MG capsule TAKE 1 CAPSULE (30 MG TOTAL) BY MOUTH 2 (TWO) TIMES DAILY. 09/09/17  Yes Sater, Nanine Means, MD  fluocinonide cream (LIDEX) 0.05 % APPLY ON THE SKIN TWICE A DAY X 2 WEEKS, THEN 1 WEEK OFF AND REPEAT 10/08/17  Yes [provider]  fluticasone (FLONASE) 50 MCG/ACT nasal spray Place 2 sprays into both nostrils at bedtime.   Yes [provider]  glucosamine-chondroitin 500-400 MG tablet Take 2 tablets by mouth daily with lunch.   Yes [provider]  losartan (COZAAR) 100 MG tablet Take 1 tablet (100 mg total) by mouth daily. Patient taking differently: Take 50-100 mg by mouth daily. > 140 Take 1 tablet & < 140 Tablet 1/2 tablet 03/09/17  Yes Crenshaw, Denice Bors, MD  Meth-Hyo-M Barnett Hatter Phos-Ph Sal (URIBEL) 118 MG CAPS Take 1 capsule by mouth daily as needed (uti).  01/25/15  Yes [provider]  Multiple Vitamin (MULTIVITAMIN WITH MINERALS) TABS tablet Take 1 tablet by mouth daily.   Yes [provider]  multivitamin-lutein (OCUVITE-LUTEIN) CAPS capsule  Take 1 capsule by mouth daily with lunch.   Yes [provider]  pantoprazole (PROTONIX) 40 MG tablet Take 40 mg by mouth daily before breakfast.  06/12/14  Yes [provider]  ranitidine (ZANTAC) 150 MG tablet Take 1 tablet (150 mg total) by mouth 2 (two) times daily. 12/22/16 10/27/17 Yes Duffy Bruce, MD  rivaroxaban (XARELTO) 20 MG TABS tablet TAKE 1 TABLET BY MOUTH EVERY DAY WITH SUPPER 12/16/16  Yes Lelon Perla, MD  simethicone (MYLICON) 494 MG chewable tablet Chew 125 mg by mouth 2 (two) times daily as needed for flatulence.   Yes [provider]  spironolactone (ALDACTONE) 25 MG tablet TAKE 1 TABLET BY MOUTH EVERY DAY IN THE MORNING 07/21/17  Yes [provider]  sucralfate (CARAFATE) 1 g tablet Take 1 tablet (1 g total) by mouth 4 (four) times daily -  with meals and at bedtime. Patient taking differently: Take 1 g by mouth 3 (three) times daily.  12/22/16  Yes Duffy Bruce, MD  thyroid (ARMOUR) 60 MG tablet Take 60 mg by mouth daily before breakfast.   Yes [provider]  tiZANidine (ZANAFLEX) 4 MG tablet TAKE 1 TABLET BY MOUTH THREE TIMES A DAY AS NEEDED Patient taking differently: TAKE 1 TABLET BY MOUTH THREE TIMES A DAY AS NEEDED SPASMS 08/04/17  Yes Sater, Nanine Means, MD  trimethoprim (TRIMPEX) 100 MG tablet Take 100 mg by mouth daily. 07/16/17  Yes [provider]  vitamin C (ASCORBIC ACID) 250 MG tablet Take 250 mg by mouth daily.   Yes [provider]    Family History Family History  Problem Relation Age of Onset  . Congestive Heart Failure Mother   . Heart disease Father        History of heart attacks at a later age.      Social History Social History   Tobacco Use  . Smoking status: Never Smoker  . Smokeless tobacco: Never Used  Substance Use Topics  . Alcohol use: Yes  . Drug use: No     Allergies   Patient has no known allergies.   Review of Systems Review of Systems 10 Systems reviewed  and are negative for acute change except as noted in the HPI.   Physical Exam Updated Vital Signs BP (!) 141/64 (BP Location: Right Arm)   Pulse 86   Temp 98.1 F (36.7 C) (Oral)   Resp 16   SpO2 95%   Physical Exam  Constitutional: She is oriented to person, place, and time. She appears well-developed and well-nourished.  HENT:  Head: Normocephalic and atraumatic.  Eyes: EOM are normal.  Neck: Neck supple.  Cardiovascular: Normal rate, regular rhythm, normal heart sounds and intact distal pulses.  Pulmonary/Chest: Effort normal and breath sounds normal.  Abdominal: Soft. Bowel sounds are normal. She exhibits no  distension. There is no tenderness.  Genitourinary:  Genitourinary Comments: Stool is black\cranberry melena.  Musculoskeletal: Normal range of motion. She exhibits no edema.  Neurological: She is alert and oriented to person, place, and time. She has normal strength. She exhibits normal muscle tone. Coordination normal. GCS eye subscore is 4. GCS verbal subscore is 5. GCS motor subscore is 6.  Skin: Skin is warm, dry and intact.  Psychiatric: She has a normal mood and affect.     ED Treatments / Results  Labs (all labs ordered are listed, but only abnormal results are displayed) Labs Reviewed  COMPREHENSIVE METABOLIC PANEL - Abnormal; Notable for the following components:      Result Value   Glucose, Bld 114 (*)    BUN 38 (*)    All other components within normal limits  CBC - Abnormal; Notable for the following components:   RBC 2.88 (*)    Hemoglobin 9.1 (*)    HCT 27.3 (*)    All other components within normal limits  PROTIME-INR - Abnormal; Notable for the following components:   Prothrombin Time 18.0 (*)    All other components within normal limits  CBC - Abnormal; Notable for the following components:   RBC 2.75 (*)    Hemoglobin 8.5 (*)    HCT 26.2 (*)    All other components within normal limits  POC OCCULT BLOOD, ED - Abnormal; Notable for the  following components:   Fecal Occult Bld POSITIVE (*)    All other components within normal limits  BASIC METABOLIC PANEL  CBC  CBC  URINALYSIS, ROUTINE W REFLEX MICROSCOPIC  TYPE AND SCREEN  ABO/RH    EKG EKG Interpretation  Date/Time:  Wednesday October 27 2017 20:53:20 EDT Ventricular Rate:  73 PR Interval:    QRS Duration: 115 QT Interval:  431 QTC Calculation: 475 R Axis:   -59 Text Interpretation:  Age not entered, assumed to be  77 years old for purpose of ECG interpretation Sinus rhythm LAD, consider left anterior fascicular block Nonspecific T abnormalities, lateral leads No significant change since last tracing Confirmed by Dorie Rank [id number]) on 10/27/2017 9:03:40 PM   Radiology No results found.  Procedures Procedures (including critical care time)  Medications Ordered in ED Medications  pantoprazole (PROTONIX) 80 mg in sodium chloride 0.9 % 250 mL (0.32 mg/mL) infusion (has no administration in time range)  cycloSPORINE (RESTASIS) 0.05 % ophthalmic emulsion 1 drop (has no administration in time range)  diltiazem (CARDIZEM CD) 24 hr capsule 120 mg (has no administration in time range)  acetaminophen (TYLENOL) tablet 650 mg (has no administration in time range)    Or  acetaminophen (TYLENOL) suppository 650 mg (has no administration in time range)  ondansetron (ZOFRAN) tablet 4 mg (has no administration in time range)    Or  ondansetron (ZOFRAN) injection 4 mg (has no administration in time range)  pantoprazole (PROTONIX) 80 mg in sodium chloride 0.9 % 100 mL IVPB (0 mg Intravenous Stopped 10/27/17 2138)     Initial Impression / Assessment and Plan / ED Course  I have reviewed the triage vital signs and the nursing notes.  Pertinent labs & imaging results that were available during my care of the patient were reviewed by me and considered in my medical decision making (see chart for details).     Consult: Dr. Hal Hope for admission Final Clinical  Impressions(s) / ED Diagnoses   Final diagnoses:  Melena  Gastrointestinal hemorrhage, unspecified gastrointestinal hemorrhage type  Anticoagulated  Patient is anticoagulated.  She has had dark stool for 2 days.  Rectal exam was positive for melena.  She does not have abdominal tenderness.  Plan for admission for ongoing observation and diagnostic testing as indicated.  ED Discharge Orders    None       Charlesetta Shanks, MD 10/28/17 0002

## 2017-10-27 NOTE — ED Triage Notes (Addendum)
Patient here from home with complaints of dark stools since Monday, 2 episodes today. Denies n/v/d.

## 2017-10-27 NOTE — ED Notes (Signed)
Patient request lab report from their doctor visit  yesterday to be reviewed before having labs drawn today.

## 2017-10-27 NOTE — ED Notes (Signed)
ED TO INPATIENT HANDOFF REPORT  Name/Age/Gender Cynthia [name] 77 y.o. female  Code Status Code Status History    Date Active Date Inactive Code Status Order ID Comments User Context   11/23/2016 2306 11/25/2016 1721 Full Code [id number]  Mariel Aloe, MD ED      Home/SNF/Other Home  Chief Complaint abnormal stool coloration  Level of Care/Admitting Diagnosis ED Disposition    ED Disposition Condition [facility] Area: [facility] [[id number]]  Level of Care: Telemetry [5]  Admit to tele based on following criteria: Monitor for Ischemic changes  Diagnosis: Acute GI bleeding [195093]  Admitting Physician: Rise Patience [phone]  Attending Physician: Rise Patience [phone]  PT Class (Do Not Modify): Observation [104]  PT Acc Code (Do Not Modify): Observation [10022]       Medical History Past Medical History:  Diagnosis Date  . A-fib ([place])   . Cancer ([place])    Melanoma  . Hearing loss   . Hypertension     Allergies No Known Allergies  IV Location/Drains/Wounds Patient Lines/Drains/Airways Status   Active Line/Drains/Airways    Name:   Placement date:   Placement time:   Site:   Days:   Peripheral IV 10/27/17 Right Wrist   10/27/17    2016    Wrist   less than 1   Sheath 11/24/16 Right Arterial;Radial   11/24/16    1613    Arterial;Radial   68          Labs/Imaging Results for orders placed or performed during the hospital encounter of 10/27/17 (from the past 48 hour(s))  Comprehensive metabolic panel     Status: Abnormal   Collection Time: 10/27/17  8:10 PM  Result Value Ref Range   Sodium 144 135 - 145 mmol/L   Potassium 4.3 3.5 - 5.1 mmol/L   Chloride 110 98 - 111 mmol/L    Comment: Please note change in reference range.   CO2 28 22 - 32 mmol/L   Glucose, Bld 114 (H) 70 - 99 mg/dL    Comment: Please note change in reference range.   BUN 38 (H) 8 - 23 mg/dL    Comment: Please note change in reference range.    Creatinine, Ser 0.86 0.44 - 1.00 mg/dL   Calcium 9.2 8.9 - 10.3 mg/dL   Total Protein 6.5 6.5 - 8.1 g/dL   Albumin 3.8 3.5 - 5.0 g/dL   AST 21 15 - 41 U/L   ALT 16 0 - 44 U/L    Comment: Please note change in reference range.   Alkaline Phosphatase 48 38 - 126 U/L   Total Bilirubin 0.3 0.3 - 1.2 mg/dL   GFR calc non Af Amer >60 >60 mL/min   GFR calc Af Amer >60 >60 mL/min    Comment: (NOTE) The eGFR has been calculated using the CKD EPI equation. This calculation has not been validated in all clinical situations. eGFR's persistently <60 mL/min signify possible Chronic Kidney Disease.    Anion gap 6 5 - 15    Comment: Performed at [facility], [place] [address], [place], [place] [address]  CBC     Status: Abnormal   Collection Time: 10/27/17  8:10 PM  Result Value Ref Range   WBC 6.8 4.0 - 10.5 K/uL   RBC 2.88 (L) 3.87 - 5.11 MIL/uL   Hemoglobin 9.1 (L) 12.0 - 15.0 g/dL   HCT 27.3 (L) 36.0 - 46.0 %  MCV 94.8 78.0 - 100.0 fL   MCH 31.6 26.0 - 34.0 pg   MCHC 33.3 30.0 - 36.0 g/dL   RDW 13.2 11.5 - 15.5 %   Platelets 246 150 - 400 K/uL    Comment: Performed at [facility], [place] [address], [place], [place] [address]  POC occult blood, ED     Status: Abnormal   Collection Time: 10/27/17  8:17 PM  Result Value Ref Range   Fecal Occult Bld POSITIVE (A) NEGATIVE   No results found.  Pending Labs Unresulted Labs (From admission, onward)   Start     Ordered   10/27/17 2031  Protime-INR  Once,   STAT     10/27/17 2031   10/27/17 1530  Type and screen [place]  STAT,   STAT    Comments:  [place]    10/27/17 1529      Vitals/Pain Today's Vitals   10/27/17 1526 10/27/17 1527 10/27/17 1817 10/27/17 2017  BP: (!) 105/57  (!) 141/64   Pulse: 82  79   Resp: 18  18   Temp: 98.1 F (36.7 C)  (!) 78 F (25.6 C)   TempSrc: Oral  Oral   SpO2: 98%  98%   PainSc:  0-No pain  1     Isolation  Precautions No active isolations  Medications Medications  pantoprazole (PROTONIX) 80 mg in sodium chloride 0.9 % 100 mL IVPB (80 mg Intravenous New Bag/Given 10/27/17 2106)  pantoprazole (PROTONIX) 80 mg in sodium chloride 0.9 % 250 mL (0.32 mg/mL) infusion (has no administration in time range)    Mobility walks with person assist (feels a little weak/dizzy)

## 2017-10-28 ENCOUNTER — Observation Stay (HOSPITAL_COMMUNITY): Payer: Medicare PPO | Admitting: Anesthesiology

## 2017-10-28 ENCOUNTER — Encounter (HOSPITAL_COMMUNITY)
Admission: EM | Disposition: A | Discharge status: Home / Self Care | Payer: Self-pay | Source: Home / Self Care | Attending: Emergency Medicine

## 2017-10-28 ENCOUNTER — Observation Stay (HOSPITAL_COMMUNITY): Payer: Medicare PPO

## 2017-10-28 ENCOUNTER — Encounter (HOSPITAL_COMMUNITY): Payer: Self-pay | Admitting: *Deleted

## 2017-10-28 DIAGNOSIS — K922 Gastrointestinal hemorrhage, unspecified: Secondary | ICD-10-CM | POA: Diagnosis not present

## 2017-10-28 DIAGNOSIS — K449 Diaphragmatic hernia without obstruction or gangrene: Secondary | ICD-10-CM | POA: Diagnosis not present

## 2017-10-28 DIAGNOSIS — N390 Urinary tract infection, site not specified: Secondary | ICD-10-CM | POA: Diagnosis not present

## 2017-10-28 DIAGNOSIS — D62 Acute posthemorrhagic anemia: Secondary | ICD-10-CM | POA: Diagnosis not present

## 2017-10-28 DIAGNOSIS — K921 Melena: Secondary | ICD-10-CM | POA: Diagnosis not present

## 2017-10-28 HISTORY — PX: ESOPHAGOGASTRODUODENOSCOPY (EGD) WITH PROPOFOL: SHX5813

## 2017-10-28 LAB — URINALYSIS, ROUTINE W REFLEX MICROSCOPIC
BILIRUBIN URINE: NEGATIVE
Glucose, UA: NEGATIVE mg/dL
Ketones, ur: NEGATIVE mg/dL
Nitrite: POSITIVE — AB
PH: 5 (ref 5.0–8.0)
Protein, ur: NEGATIVE mg/dL
SPECIFIC GRAVITY, URINE: 1.016 (ref 1.005–1.030)

## 2017-10-28 LAB — BASIC METABOLIC PANEL
Anion gap: 6 (ref 5–15)
BUN: 32 mg/dL — AB (ref 8–23)
CO2: 27 mmol/L (ref 22–32)
CREATININE: 0.73 mg/dL (ref 0.44–1.00)
Calcium: 8.9 mg/dL (ref 8.9–10.3)
Chloride: 111 mmol/L (ref 98–111)
GFR calc Af Amer: >60 mL/min (ref >60)
GFR calc non Af Amer: >60 mL/min (ref >60)
GLUCOSE: 104 mg/dL — AB (ref 70–99)
Potassium: 3.7 mmol/L (ref 3.5–5.1)
SODIUM: 144 mmol/L (ref 135–145)

## 2017-10-28 LAB — CBC
HCT: 25.3 % — ABNORMAL LOW (ref 36.0–46.0)
HCT: 26.7 % — ABNORMAL LOW (ref 36.0–46.0)
Hemoglobin: 8.3 g/dL — ABNORMAL LOW (ref 12.0–15.0)
Hemoglobin: 8.8 g/dL — ABNORMAL LOW (ref 12.0–15.0)
MCH: 31.1 pg (ref 26.0–34.0)
MCH: 31.3 pg (ref 26.0–34.0)
MCHC: 32.8 g/dL (ref 30.0–36.0)
MCHC: 33 g/dL (ref 30.0–36.0)
MCV: 94.8 fL (ref 78.0–100.0)
MCV: 95 fL (ref 78.0–100.0)
PLATELETS: 226 10*3/uL (ref 150–400)
Platelets: 229 10*3/uL (ref 150–400)
RBC: 2.67 MIL/uL — ABNORMAL LOW (ref 3.87–5.11)
RBC: 2.81 MIL/uL — ABNORMAL LOW (ref 3.87–5.11)
RDW: 13.3 % (ref 11.5–15.5)
RDW: 13.2 % (ref 11.5–15.5)
WBC: 6.1 10*3/uL (ref 4.0–10.5)
WBC: 5.3 10*3/uL (ref 4.0–10.5)

## 2017-10-28 LAB — GLUCOSE, CAPILLARY
GLUCOSE-CAPILLARY: 88 mg/dL (ref 70–99)
GLUCOSE-CAPILLARY: 94 mg/dL (ref 70–99)
GLUCOSE-CAPILLARY: 94 mg/dL (ref 70–99)
Glucose-Capillary: 80 mg/dL (ref 70–99)
Glucose-Capillary: 85 mg/dL (ref 70–99)
Glucose-Capillary: 90 mg/dL (ref 70–99)

## 2017-10-28 LAB — ABO/RH: ABO/RH(D): A POS

## 2017-10-28 SURGERY — ESOPHAGOGASTRODUODENOSCOPY (EGD) WITH PROPOFOL
Anesthesia: Monitor Anesthesia Care

## 2017-10-28 MED ORDER — SODIUM CHLORIDE 0.9 % IV SOLN
INTRAVENOUS | Status: DC
  Administered 2017-10-28: 12:00:00 via INTRAVENOUS

## 2017-10-28 MED ORDER — LIDOCAINE 2% (20 MG/ML) 5 ML SYRINGE
INTRAMUSCULAR | Status: DC | PRN
  Administered 2017-10-28: 100 mg via INTRAVENOUS

## 2017-10-28 MED ORDER — SODIUM CHLORIDE 0.9 % IV SOLN
1.0000 g | INTRAVENOUS | Status: DC
  Administered 2017-10-28 – 2017-10-29 (×2): 1 g via INTRAVENOUS
  Filled 2017-10-28 (×2): qty 1

## 2017-10-28 MED ORDER — PROPOFOL 10 MG/ML IV BOLUS
INTRAVENOUS | Status: DC | PRN
  Administered 2017-10-28 (×2): 20 mg via INTRAVENOUS

## 2017-10-28 MED ORDER — IOPAMIDOL (ISOVUE-300) INJECTION 61%
100.0000 mL | Freq: Once | INTRAVENOUS | Status: AC | PRN
  Administered 2017-10-28: 100 mL via INTRAVENOUS

## 2017-10-28 MED ORDER — PROPOFOL 500 MG/50ML IV EMUL
INTRAVENOUS | Status: DC | PRN
  Administered 2017-10-28: 120 ug/kg/min via INTRAVENOUS

## 2017-10-28 MED ORDER — IOPAMIDOL (ISOVUE-300) INJECTION 61%
INTRAVENOUS | Status: AC
  Administered 2017-10-28: 30 mL via ORAL
  Filled 2017-10-28: qty 30

## 2017-10-28 MED ORDER — LACTATED RINGERS IV SOLN
INTRAVENOUS | Status: DC
  Administered 2017-10-28: 13:00:00 via INTRAVENOUS

## 2017-10-28 MED ORDER — PROPOFOL 10 MG/ML IV BOLUS
INTRAVENOUS | Status: AC
  Filled 2017-10-28: qty 40

## 2017-10-28 MED ORDER — PANTOPRAZOLE SODIUM 40 MG PO TBEC
40.0000 mg | DELAYED_RELEASE_TABLET | Freq: Every day | ORAL | Status: DC
  Administered 2017-10-28: 40 mg via ORAL
  Filled 2017-10-28 (×2): qty 1

## 2017-10-28 MED ORDER — IOPAMIDOL (ISOVUE-300) INJECTION 61%
INTRAVENOUS | Status: AC
Start: 2017-10-28 — End: 2017-10-29
  Filled 2017-10-28: qty 100

## 2017-10-28 MED ORDER — IOPAMIDOL (ISOVUE-300) INJECTION 61%
30.0000 mL | Freq: Once | INTRAVENOUS | Status: AC | PRN
  Administered 2017-10-28: 30 mL via ORAL

## 2017-10-28 SURGICAL SUPPLY — 14 items

## 2017-10-28 NOTE — Op Note (Signed)
[facility] Patient Name: Cynthia [name] Procedure Date: 10/28/2017 MRN: [id number] Attending MD: Clarene Essex , MD Date of Birth: March 10, 1941 CSN: [id number] Age: 77 Admit Type: Inpatient Procedure:                Upper GI endoscopy Indications:              Acute post hemorrhagic anemia, Melena Providers:                Clarene Essex, MD, Elmer Ramp. Tilden Dome, RN, Tinnie Gens,                            Technician, Danley Danker, CRNA Referring MD:              Medicines:                Propofol total dose 110 mg IV, 100 mg IV lidocaine Complications:            No immediate complications. Estimated Blood Loss:     Estimated blood loss: none. Procedure:                Pre-Anesthesia Assessment:                           - Prior to the procedure, a History and Physical                            was performed, and patient medications and                            allergies were reviewed. The patient's tolerance of                            previous anesthesia was also reviewed. The risks                            and benefits of the procedure and the sedation                            options and risks were discussed with the patient.                            All questions were answered, and informed consent                            was obtained. Prior Anticoagulants: The patient has                            taken Xarelto (rivaroxaban), last dose was 2 days                            prior to procedure. ASA Grade Assessment: II - A                            patient with mild systemic disease. After reviewing  the risks and benefits, the patient was deemed in                            satisfactory condition to undergo the procedure.                           After obtaining informed consent, the endoscope was                            passed under direct vision. Throughout the                            procedure, the patient's blood pressure,  pulse, and                            oxygen saturations were monitored continuously. The                            [id number] ) was introduced through the                            mouth, and advanced to the third part of duodenum.                            The upper GI endoscopy was accomplished without                            difficulty. The patient tolerated the procedure                            well. Scope In: Scope Out: Findings:      A small hiatal hernia was present.      The entire examined stomach was normal.      The duodenal bulb, first portion of the duodenum, second portion of the       duodenum and third portion of the duodenum were normal.      The exam was otherwise without abnormality. Impression:               - Small hiatal hernia.                           - Normal stomach.                           - Normal duodenal bulb, first portion of the                            duodenum, second portion of the duodenum and third                            portion of the duodenum.                           - The examination was otherwise normal.                           -  No specimens collected. Moderate Sedation:      N/A- Per Anesthesia Care Recommendation:           - Patient has a contact number available for                            emergencies. The signs and symptoms of potential                            delayed complications were discussed with the                            patient. Return to normal activities tomorrow.                            Written discharge instructions were provided to the                            patient.                           - Clear liquid diet today.                           - Continue present medications. Rediscuss blood                            thinner needs with cardiology to see if she truly                            needs the same medicine or can be managed with a                            less  risky blood thinner- Return to GI clinic PRN.                           - Telephone GI clinic if symptomatic PRN. Cautioned                            patient about nonsteroidal use with blood thinners                           - Perform a CT scan (computed tomography) of                            abdomen with contrast and pelvis with contrast                            today. If negative may advance diet and follow-up                            in the office with me to discuss repeat colonoscopy  as an outpatient Procedure Code(s):        --- Professional ---                           [phone], Esophagogastroduodenoscopy, flexible,                            transoral; diagnostic, including collection of                            specimen(s) by brushing or washing, when performed                            (separate procedure) Diagnosis Code(s):        --- Professional ---                           K44.9, Diaphragmatic hernia without obstruction or                            gangrene                           D62, Acute posthemorrhagic anemia                           K92.1, Melena (includes Hematochezia) CPT copyright 2017 American Medical Association. All rights reserved. The codes documented in this report are preliminary and upon coder review may  be revised to meet current compliance requirements. Clarene Essex, MD 10/28/2017 1:38:11 PM This report has been signed electronically. Number of Addenda: 0

## 2017-10-28 NOTE — Anesthesia Preprocedure Evaluation (Signed)
Anesthesia Evaluation  Patient identified by MRN, date of birth, ID band Patient awake    Reviewed: Allergy & Precautions, H&P , NPO status , Patient's Chart, lab work & pertinent test results  Airway Mallampati: II   Neck ROM: full    Dental   Pulmonary sleep apnea ,    breath sounds clear to auscultation       Cardiovascular hypertension, + dysrhythmias Atrial Fibrillation  Rhythm:regular Rate:Normal     Neuro/Psych  Headaches,  Neuromuscular disease    GI/Hepatic GERD  ,GI bleed   Endo/Other    Renal/GU      Musculoskeletal  (+) Arthritis ,   Abdominal   Peds  Hematology  (+) anemia ,   Anesthesia Other Findings   Reproductive/Obstetrics                             Anesthesia Physical Anesthesia Plan  ASA: III  Anesthesia Plan: MAC   Post-op Pain Management:    Induction: Intravenous  PONV Risk Score and Plan: 2 and Ondansetron, Propofol infusion and Treatment may vary due to age or medical condition  Airway Management Planned: Nasal Cannula  Additional Equipment:   Intra-op Plan:   Post-operative Plan:   Informed Consent: I have reviewed the patients History and Physical, chart, labs and discussed the procedure including the risks, benefits and alternatives for the proposed anesthesia with the patient or authorized representative who has indicated his/her understanding and acceptance.     Plan Discussed with: Anesthesiologist, Surgeon and CRNA  Anesthesia Plan Comments:         Anesthesia Quick Evaluation

## 2017-10-28 NOTE — Transfer of Care (Signed)
Immediate Anesthesia Transfer of Care Note  Patient: Cynthia [name]  Procedure(s) Performed: ESOPHAGOGASTRODUODENOSCOPY (EGD) WITH PROPOFOL (N/A )  Patient Location: Endoscopy Unit  Anesthesia Type:MAC  Level of Consciousness: awake and alert   Airway & Oxygen Therapy: Patient Spontanous Breathing and Patient connected to nasal cannula oxygen  Post-op Assessment: Report given to RN and Post -op Vital signs reviewed and stable  Post vital signs: Reviewed and stable  Last Vitals:  Vitals Value Taken Time  BP 127/68 10/28/2017  1:34 PM  Temp    Pulse 80 10/28/2017  1:35 PM  Resp 18 10/28/2017  1:35 PM  SpO2 99 % 10/28/2017  1:35 PM  Vitals shown include unvalidated device data.  Last Pain:  Vitals:   10/28/17 1235  TempSrc: Oral  PainSc: 0-No pain         Complications: No apparent anesthesia complications

## 2017-10-28 NOTE — Consult Note (Signed)
Reason for Consult:GI blood loss in a patient on blood thinners Referring Physician: Hospital team  Cynthia [name] is an 77 y.o. female.  HPI: Asian known to me from taking care of her and her husband and she had a colonoscopy and endoscopy 5 years ago which were nondiagnostic and she hasn't felt good for a week and she's been taking some Aleve with her Xarelto but no extra aspirin or other nonsteroidals and she has been on both Protonix and Carafate and has noticed some darker stoolsand increased fatigue and weakness and shortness of breath but she has not had GI complaints in a while up until now  Past Medical History:  Diagnosis Date  . A-fib ([place])   . Cancer ([place])    Melanoma  . Hearing loss   . Hypertension     Past Surgical History:  Procedure Laterality Date  . APPENDECTOMY    . CRANIECTOMY FOR EXCISION OF ACOUSTIC NEUROMA    . LEFT HEART CATH AND CORONARY ANGIOGRAPHY N/A 11/24/2016   Procedure: LEFT HEART CATH AND CORONARY ANGIOGRAPHY;  Surgeon: Jettie Booze, MD;  Location: [facility];  Service: Cardiovascular;  Laterality: N/A;  . PARTIAL HYSTERECTOMY    . TONSILLECTOMY      Family History  Problem Relation Age of Onset  . Congestive Heart Failure Mother   . Heart disease Father        History of heart attacks at a later age.      Social History:  reports that she has never smoked. She has never used smokeless tobacco. She reports that she drinks alcohol. She reports that she does not use drugs.  Allergies: No Known Allergies  Medications: I have reviewed the patient's current medications.  Results for orders placed or performed during the hospital encounter of 10/27/17 (from the past 48 hour(s))  Comprehensive metabolic panel     Status: Abnormal   Collection Time: 10/27/17  8:10 PM  Result Value Ref Range   Sodium 144 135 - 145 mmol/L   Potassium 4.3 3.5 - 5.1 mmol/L   Chloride 110 98 - 111 mmol/L    Comment: Please note change in reference  range.   CO2 28 22 - 32 mmol/L   Glucose, Bld 114 (H) 70 - 99 mg/dL    Comment: Please note change in reference range.   BUN 38 (H) 8 - 23 mg/dL    Comment: Please note change in reference range.   Creatinine, Ser 0.86 0.44 - 1.00 mg/dL   Calcium 9.2 8.9 - 10.3 mg/dL   Total Protein 6.5 6.5 - 8.1 g/dL   Albumin 3.8 3.5 - 5.0 g/dL   AST 21 15 - 41 U/L   ALT 16 0 - 44 U/L    Comment: Please note change in reference range.   Alkaline Phosphatase 48 38 - 126 U/L   Total Bilirubin 0.3 0.3 - 1.2 mg/dL   GFR calc non Af Amer >60 >60 mL/min   GFR calc Af Amer >60 >60 mL/min    Comment: (NOTE) The eGFR has been calculated using the CKD EPI equation. This calculation has not been validated in all clinical situations. eGFR's persistently <60 mL/min signify possible Chronic Kidney Disease.    Anion gap 6 5 - 15    Comment: Performed at [facility], [place] [address] [place], [place] [address]  CBC     Status: Abnormal   Collection Time: 10/27/17  8:10 PM  Result Value Ref Range  WBC 6.8 4.0 - 10.5 K/uL   RBC 2.88 (L) 3.87 - 5.11 MIL/uL   Hemoglobin 9.1 (L) 12.0 - 15.0 g/dL   HCT 27.3 (L) 36.0 - 46.0 %   MCV 94.8 78.0 - 100.0 fL   MCH 31.6 26.0 - 34.0 pg   MCHC 33.3 30.0 - 36.0 g/dL   RDW 13.2 11.5 - 15.5 %   Platelets 246 150 - 400 K/uL    Comment: Performed at [facility], [place] [address], [place], [place] [address]  Type and screen [place]     Status: None   Collection Time: 10/27/17  8:10 PM  Result Value Ref Range   ABO/RH(D) A POS    Antibody Screen NEG    Sample Expiration      10/30/2017 Performed at [facility], [place] [address], [place], [place] [address]   ABO/Rh     Status: None   Collection Time: 10/27/17  8:10 PM  Result Value Ref Range   ABO/RH(D)      A POS Performed at [facility], [place] [address], [place], [place] [address]   POC occult blood, ED     Status:  Abnormal   Collection Time: 10/27/17  8:17 PM  Result Value Ref Range   Fecal Occult Bld POSITIVE (A) NEGATIVE  Protime-INR     Status: Abnormal   Collection Time: 10/27/17  9:02 PM  Result Value Ref Range   Prothrombin Time 18.0 (H) 11.4 - 15.2 seconds   INR 1.50     Comment: Performed at [facility], [place] [address] [place], [place] [address]  CBC     Status: Abnormal   Collection Time: 10/27/17 10:58 PM  Result Value Ref Range   WBC 7.1 4.0 - 10.5 K/uL   RBC 2.75 (L) 3.87 - 5.11 MIL/uL   Hemoglobin 8.5 (L) 12.0 - 15.0 g/dL   HCT 26.2 (L) 36.0 - 46.0 %   MCV 95.3 78.0 - 100.0 fL   MCH 30.9 26.0 - 34.0 pg   MCHC 32.4 30.0 - 36.0 g/dL   RDW 13.3 11.5 - 15.5 %   Platelets 224 150 - 400 K/uL    Comment: Performed at [facility], [place] [address] [place], [place] [address]  Glucose, capillary     Status: None   Collection Time: 10/27/17 11:53 PM  Result Value Ref Range   Glucose-Capillary 94 70 - 99 mg/dL  CBC     Status: Abnormal   Collection Time: 10/28/17  2:36 AM  Result Value Ref Range   WBC 6.1 4.0 - 10.5 K/uL   RBC 2.67 (L) 3.87 - 5.11 MIL/uL   Hemoglobin 8.3 (L) 12.0 - 15.0 g/dL   HCT 25.3 (L) 36.0 - 46.0 %   MCV 94.8 78.0 - 100.0 fL   MCH 31.1 26.0 - 34.0 pg   MCHC 32.8 30.0 - 36.0 g/dL   RDW 13.3 11.5 - 15.5 %   Platelets 226 150 - 400 K/uL    Comment: Performed at [facility], [place] [address], [place], [place] [address]  Urinalysis, Routine w reflex microscopic     Status: Abnormal   Collection Time: 10/28/17  4:49 AM  Result Value Ref Range   Color, Urine YELLOW YELLOW   APPearance HAZY (A) CLEAR   Specific Gravity, Urine 1.016 1.005 - 1.030   pH 5.0 5.0 - 8.0   Glucose, UA NEGATIVE NEGATIVE mg/dL  Hgb urine dipstick SMALL (A) NEGATIVE   Bilirubin Urine NEGATIVE NEGATIVE   Ketones, ur NEGATIVE NEGATIVE mg/dL   Protein, ur NEGATIVE NEGATIVE mg/dL   Nitrite POSITIVE (A) NEGATIVE   Leukocytes, UA  MODERATE (A) NEGATIVE   RBC / HPF 0-5 0 - 5 RBC/hpf   WBC, UA 21-50 0 - 5 WBC/hpf   Bacteria, UA MANY (A) NONE SEEN   Squamous Epithelial / LPF 0-5 0 - 5   Mucus PRESENT     Comment: Performed at [facility], [place] [address] [place], [place] [address]  Glucose, capillary     Status: None   Collection Time: 10/28/17  4:52 AM  Result Value Ref Range   Glucose-Capillary 90 70 - 99 mg/dL  Basic metabolic panel     Status: Abnormal   Collection Time: 10/28/17  6:42 AM  Result Value Ref Range   Sodium 144 135 - 145 mmol/L   Potassium 3.7 3.5 - 5.1 mmol/L   Chloride 111 98 - 111 mmol/L    Comment: Please note change in reference range.   CO2 27 22 - 32 mmol/L   Glucose, Bld 104 (H) 70 - 99 mg/dL    Comment: Please note change in reference range.   BUN 32 (H) 8 - 23 mg/dL    Comment: Please note change in reference range.   Creatinine, Ser 0.73 0.44 - 1.00 mg/dL   Calcium 8.9 8.9 - 10.3 mg/dL   GFR calc non Af Amer >60 >60 mL/min   GFR calc Af Amer >60 >60 mL/min    Comment: (NOTE) The eGFR has been calculated using the CKD EPI equation. This calculation has not been validated in all clinical situations. eGFR's persistently <60 mL/min signify possible Chronic Kidney Disease.    Anion gap 6 5 - 15    Comment: Performed at [facility], [place] [address] [place], [place] [address]  CBC     Status: Abnormal   Collection Time: 10/28/17  6:42 AM  Result Value Ref Range   WBC 5.3 4.0 - 10.5 K/uL   RBC 2.81 (L) 3.87 - 5.11 MIL/uL   Hemoglobin 8.8 (L) 12.0 - 15.0 g/dL   HCT 26.7 (L) 36.0 - 46.0 %   MCV 95.0 78.0 - 100.0 fL   MCH 31.3 26.0 - 34.0 pg   MCHC 33.0 30.0 - 36.0 g/dL   RDW 13.2 11.5 - 15.5 %   Platelets 229 150 - 400 K/uL    Comment: Performed at [facility], [place] [address] [place], [place] [address]  Glucose, capillary     Status: None   Collection Time: 10/28/17 12:05 PM  Result Value Ref Range    Glucose-Capillary 80 70 - 99 mg/dL    No results found.  ROSnegative except above the case was discussed with her husband as well as the hospital team Blood pressure 127/72, pulse 76, temperature 98.7 F (37.1 C), temperature source Oral, resp. rate 17, height '5\' 5"'  (1.651 m), weight 68.9 kg (152 lb), SpO2 95 %. Physical Examvital signs stable afebrile no acute distress exam please see preassessment evaluation labs reviewed  Assessment/Plan: GI bleeding in patient on nonsteroidals and blood thinners Plan: Will proceed with endoscopy which her and her husband have had before with anesthesia assistance with further workup and plans pending those findings  Bigelow E 10/28/2017, 1:15 PM

## 2017-10-28 NOTE — Anesthesia Procedure Notes (Signed)
Date/Time: 10/28/2017 1:17 PM Performed by: Sharlette Dense, CRNA Oxygen Delivery Method: Nasal cannula

## 2017-10-28 NOTE — Progress Notes (Signed)
Discuss case with family doctor Adrian Prows  is her cardiologist and would give him a call to see about blood thinners going forward and if CT okayit is okay to advance her diet and if no further bleeding can go home and we will see her back in the office to set up a outpatient colonoscopy next in the near future

## 2017-10-28 NOTE — Progress Notes (Addendum)
PROGRESS NOTE    Patient: Cynthia [name]     PCP: Deland Pretty, MD                    DOB: [birth year]            DOA: 10/27/2017 XTK:[id number]             DOS: 10/28/2017, 3:13 PM   LOS: 0 days   Date of Service: The patient was seen and examined on 10/28/2017  Subjective:   Patient was seen and examined this morning, stable no acute distress.   N.p.o. in anticipation of GI work-up  ----------------------------------------------------------------------------------------------------------------------  Brief Narrative:   Shevy Yaney is a 77 y.o. female with history of atrial fibrillation on Xarelto, hypertension, diastolic dysfunction, hyperlipidemia presents to the ER due to GI bleed.  Patient states over the last 2 weeks patient has been noticing black stools on bowel movement.  Today she noticed some blood on moving her bowels and wiping.  Has been examined no increasing abdominal discomfort around the periumbilical area denies any vomiting.  Patient states she has been recently taking Aleve for arthritis pain.  Patient did feel some dizziness today on standing.  Denies any chest pain or shortness of breath.   In the ER patient's hemoglobin is around 9.  Patient was started on Protonix infusion for GI bleed likely from upper GI source.  Patient states she had a recent colonoscopy by Dr. Watt Climes around 5 years ago which was unremarkable.  Principal Problem:   Acute GI bleeding Active Problems:   OSA (obstructive sleep apnea)   Essential hypertension, benign   Acute blood loss anemia   Assessment & Plan:  Acute GI bleeding/melena -Patient was kept n.p.o., on IV Protonix, monitoring H&H,  -given that patient stool is melanotic likely could be upper GI source that patient also was taking Aleve. Gastroenterologist Dr. Watt Climes  was consulted, status post EGD -normal findings Gastroenterologist recommended colonoscopy as an outpatient  UTI - started on IV Abx, will Fup with urin  cultures  Anemia appears to be chronic.    Monitoring H&H stable Atrial fibrillation  - on Cardizem and Xarelto.  Xarelto on hold due to acute GI bleed.  Continue Cardizem Hypertension on Cozaar and Cardizem.  Holding off Cozaar and keeping patient on PRN IV hydralazine. Hyperlipidemia on statins. Hypothyroidism continue [place] Thyroid once patient can take orally.   DVT prophylaxis: SCDs. Code Status: Full code. Family Communication: Patient's husband and daughter. Disposition Plan: Home. Consults called: None. Admission status: Inpatient.       Antimicrobials:  Anti-infectives (From admission, onward)   Start     Dose/Rate Route Frequency Ordered Stop   10/28/17 0800  cefTRIAXone (ROCEPHIN) 1 g in sodium chloride 0.9 % 100 mL IVPB     1 g 200 mL/hr over 30 Minutes Intravenous Every 24 hours 10/28/17 0641        Objective: Vitals:   10/28/17 1335 10/28/17 1345 10/28/17 1354 10/28/17 1456  BP: 127/68 124/73 133/77 (!) 146/91  Pulse: 80 71 70 78  Resp: 20 16 16 17   Temp: 98 F (36.7 C)   98.2 F (36.8 C)  TempSrc: Oral     SpO2: 99% 97% 98% 98%  Weight:      Height:        Intake/Output Summary (Last 24 hours) at 10/28/2017 1513 Last data filed at 10/28/2017 1335 Gross per 24 hour  Intake 546.67 ml  Output 850 ml  Net -303.33 ml   Filed Weights   10/28/17 1235  Weight: 68.9 kg (152 lb)    Examination:  General exam: Appears calm and comfortable  Psychiatry: Judgement and insight appear normal. Mood & affect appropriate. HEENT: WNLs Respiratory system: Clear to auscultation. Respiratory effort normal. Cardiovascular system: S1 & S2 heard, RRR. No JVD, murmurs, rubs, gallops or clicks. No pedal edema. Gastrointestinal system: Abd. nondistended, soft and nontender. No organomegaly or masses felt. Normal bowel sounds heard. Central nervous system: Alert and oriented. No focal neurological deficits. Extremities: Symmetric 5 x 5 power. Skin: No rashes,  lesions or ulcers   Data Reviewed: I have personally reviewed following labs and imaging studies  CBC: Recent Labs  Lab 10/27/17 2010 10/27/17 2258 10/28/17 0236 10/28/17 0642  WBC 6.8 7.1 6.1 5.3  HGB 9.1* 8.5* 8.3* 8.8*  HCT 27.3* 26.2* 25.3* 26.7*  MCV 94.8 95.3 94.8 95.0  PLT 246 224 226 053   Basic Metabolic Panel: Recent Labs  Lab 10/27/17 2010 10/28/17 0642  NA 144 144  K 4.3 3.7  CL 110 111  CO2 28 27  GLUCOSE 114* 104*  BUN 38* 32*  CREATININE 0.86 0.73  CALCIUM 9.2 8.9   GFR: Estimated Creatinine Clearance: 59.3 mL/min (by C-G formula based on SCr of 0.73 mg/dL). Liver Function Tests: Recent Labs  Lab 10/27/17 2010  AST 21  ALT 16  ALKPHOS 48  BILITOT 0.3  PROT 6.5  ALBUMIN 3.8   No results for input(s): LIPASE, AMYLASE in the last 168 hours. No results for input(s): AMMONIA in the last 168 hours. Coagulation Profile: Recent Labs  Lab 10/27/17 2102  INR 1.50   Cardiac Enzymes: No results for input(s): CKTOTAL, CKMB, CKMBINDEX, TROPONINI in the last 168 hours. BNP (last 3 results) No results for input(s): PROBNP in the last 8760 hours. HbA1C: No results for input(s): HGBA1C in the last 72 hours. CBG: Recent Labs  Lab 10/27/17 2353 10/28/17 0452 10/28/17 1205  GLUCAP 94 90 80   Lipid Profile: No results for input(s): CHOL, HDL, LDLCALC, TRIG, CHOLHDL, LDLDIRECT in the last 72 hours. Thyroid Function Tests: No results for input(s): TSH, T4TOTAL, FREET4, T3FREE, THYROIDAB in the last 72 hours. Anemia Panel: No results for input(s): VITAMINB12, FOLATE, FERRITIN, TIBC, IRON, RETICCTPCT in the last 72 hours. Sepsis Labs: No results for input(s): PROCALCITON, LATICACIDVEN in the last 168 hours.  No results found for this or any previous visit (from the past 240 hour(s)).    Radiology Studies: No results found.  Scheduled Meds: . iopamidol      . cycloSPORINE  1 drop Both Eyes BID  . diltiazem  120 mg Oral Daily  . pantoprazole   40 mg Oral Daily   Continuous Infusions: . cefTRIAXone (ROCEPHIN)  IV Stopped (10/28/17 1104)    Time spent: >25 minutes  Deatra James, MD [facility],  Pager [phone]  If 7PM-7AM, please contact night-coverage [email or web address]   Password [facility]  10/28/2017, 3:13 PM

## 2017-10-28 NOTE — Care Management Obs Status (Signed)
[place] NOTIFICATION   Patient Details  Name: Cynthia [name] MRN: [id number] Date of Birth: 1941/01/22   Medicare Observation Status Notification Given:  Yes    Leeroy Cha, RN 10/28/2017, 11:13 AM

## 2017-10-29 ENCOUNTER — Encounter (HOSPITAL_COMMUNITY): Payer: Self-pay | Admitting: Gastroenterology

## 2017-10-29 DIAGNOSIS — K922 Gastrointestinal hemorrhage, unspecified: Secondary | ICD-10-CM | POA: Diagnosis not present

## 2017-10-29 DIAGNOSIS — N390 Urinary tract infection, site not specified: Secondary | ICD-10-CM | POA: Diagnosis present

## 2017-10-29 LAB — GLUCOSE, CAPILLARY
GLUCOSE-CAPILLARY: 92 mg/dL (ref 70–99)
GLUCOSE-CAPILLARY: 104 mg/dL — AB (ref 70–99)

## 2017-10-29 LAB — CBC
HCT: 30.3 % — ABNORMAL LOW (ref 36.0–46.0)
Hemoglobin: 9.8 g/dL — ABNORMAL LOW (ref 12.0–15.0)
MCH: 30.7 pg (ref 26.0–34.0)
MCHC: 32.3 g/dL (ref 30.0–36.0)
MCV: 95 fL (ref 78.0–100.0)
Platelets: 263 10*3/uL (ref 150–400)
RBC: 3.19 MIL/uL — ABNORMAL LOW (ref 3.87–5.11)
RDW: 13 % (ref 11.5–15.5)
WBC: 5 10*3/uL (ref 4.0–10.5)

## 2017-10-29 LAB — BASIC METABOLIC PANEL
Anion gap: 10 (ref 5–15)
BUN: 19 mg/dL (ref 8–23)
CHLORIDE: 104 mmol/L (ref 98–111)
CO2: 28 mmol/L (ref 22–32)
Calcium: 9.4 mg/dL (ref 8.9–10.3)
Creatinine, Ser: 0.77 mg/dL (ref 0.44–1.00)
GFR calc non Af Amer: >60 mL/min (ref >60)
Glucose, Bld: 132 mg/dL — ABNORMAL HIGH (ref 70–99)
POTASSIUM: 3.8 mmol/L (ref 3.5–5.1)
SODIUM: 142 mmol/L (ref 135–145)

## 2017-10-29 MED ORDER — CIPROFLOXACIN HCL 500 MG PO TABS: 500.0000 mg | ORAL_TABLET | Freq: Two times a day (BID) | ORAL | 0 refills | Status: AC

## 2017-10-29 MED ORDER — RIVAROXABAN 20 MG PO TABS: ORAL_TABLET | ORAL | 12 refills | Status: DC

## 2017-10-29 NOTE — Discharge Summary (Signed)
Physician Discharge Summary [facility]    Patient: Cynthia [name]                   Admit date: 10/27/2017   DOB: July 09, 1940             Discharge date:10/29/2017/11:03 AM WEX:[id number]                           PCP: Cynthia Pretty, MD Recommendations for Outpatient Follow-up:   1.  Please follow-up with your primary care physician within 1-2 weeks. 2.  These follow-up with a gastroenterologist on 11/01/2017 for further evaluation of your GI bleed, possible scheduling colonoscopy, and restarting your medication of Xarelto  Discharge Condition: Stable  CODE STATUS:  Full code   Diet recommendation:  Cardiac diet ----------------------------------------------------------------------------------------------------------------------  Discharge Diagnoses:   Principal Problem:   Acute GI bleeding Active Problems:   OSA (obstructive sleep apnea)   Essential hypertension, benign   Acute blood loss anemia   Acute lower UTI   History of present illness :  Cynthia [name] a 77 y.o.femalewithhistory of atrial fibrillation on Xarelto, hypertension, diastolic dysfunction, hyperlipidemia presents to the ER due to GI bleed. Patient states over the last 2 weeks patient has been noticing black stools on bowel movement. Today she noticed some blood on moving her bowels and wiping. Has been examined no increasing abdominal discomfort around the periumbilical area denies any vomiting. Patient states she has been recently taking Aleve for arthritis pain. Patient did feel some dizziness today on standing. Denies any chest pain or shortness of breath.  In the ER patient's hemoglobin is around 9. Patient was started on Protonix infusion for GI bleed likely from upper GI source. Patient states she had a recent colonoscopy by Dr. Trey [name] 5 years ago which was unremarkable.  Hospital course / Brief Summary:  Patient was subsequently admitted for GI bleed/melena: Was kept  n.p.o., was started on IV Protonix, gastroenterologist Dr. Watt [name], patient has successfully completed EGD, no active site of bleeding was identified. Patient's hemoglobin hematocrit mostly, did not drop ordered she did not need a transfusion. Gastroenterologist Dr. Percell [name] followed up who recommended the patient to be discharged, and follow-up in office on Monday hold Xarelto till then.  Patient will be evaluated for colonoscopy.  And restarting of her Xarelto they will contact her cardiologist accordingly. She was also noted for urinary tract infection, was started on Rocephin received 2 doses, cultures did not grow anything x2 days, patient was switched to oral antibiotics of ciprofloxacin.  Patient's comorbidities including h/o chronic anemia, hypertension, hyperlipidemia, hypothyroidism, A-fib was addressed, patient home medications were addressed. All to be continued with exception of Xarelto.  Patient is to and stay away from NSAIDs that includes Aleve.  Is cleared for discharge, instructed to follow with gastroenterologist, hold her Xarelto until Monday, 11/01/2017.   The above findings plan of care and discharge has been discussed with the patient in detail she currently expressed understanding and agree  With above paln.   Consultations:  GI  Procedures:  Status post EGD on 10/28/2017, By Dr. Watt [name] --- WNL   ----------------------------------------------------------------------------------------------------------------------  Discharge Instructions:   Discharge Instructions    Activity as tolerated - No restrictions   Complete by:  As directed    Diet - low sodium heart healthy   Complete by:  As directed    Discharge instructions   Complete by:  As directed    Please  follow-up with your gastroenterologist, will monitor your hemoglobin, and will consult with your cardiologist to restart your Xarelto   Increase activity slowly   Complete by:  As directed        Medication  List    TAKE these medications   atorvastatin 10 MG tablet Commonly known as:  LIPITOR TAKE 1 TABLET BY MOUTH EVERY DAY AT 6 PM   CALCIUM PO Take 1 tablet by mouth 2 (two) times daily.   cholecalciferol 1000 units tablet Commonly known as:  VITAMIN D Take 1,000 Units by mouth daily.   ciprofloxacin 500 MG tablet Commonly known as:  CIPRO Take 1 tablet (500 mg total) by mouth 2 (two) times daily for 5 days.   CRANBERRY CONCENTRATE PO Take 1 capsule by mouth daily.   cycloSPORINE 0.05 % ophthalmic emulsion Commonly known as:  RESTASIS Place 1 drop into both eyes 2 (two) times daily.   diltiazem 120 MG 24 hr capsule Commonly known as:  CARDIZEM CD Take 1 capsule (120 mg total) by mouth daily.   DULoxetine 30 MG capsule Commonly known as:  CYMBALTA TAKE 1 CAPSULE (30 MG TOTAL) BY MOUTH 2 (TWO) TIMES DAILY.   fluocinonide cream 0.05 % Commonly known as:  LIDEX APPLY ON THE SKIN TWICE A DAY X 2 WEEKS, THEN 1 WEEK OFF AND REPEAT   fluticasone 50 MCG/ACT nasal spray Commonly known as:  FLONASE Place 2 sprays into both nostrils at bedtime.   glucosamine-chondroitin 500-400 MG tablet Take 2 tablets by mouth daily with lunch.   LIPOIC ACID PO Take 2 tablets by mouth 2 (two) times daily.   losartan 100 MG tablet Commonly known as:  COZAAR Take 1 tablet (100 mg total) by mouth daily. What changed:    how much to take  additional instructions   multivitamin with minerals Tabs tablet Take 1 tablet by mouth daily.   multivitamin-lutein Caps capsule Take 1 capsule by mouth daily with lunch.   pantoprazole 40 MG tablet Commonly known as:  PROTONIX Take 40 mg by mouth daily before breakfast.   ranitidine 150 MG tablet Commonly known as:  ZANTAC Take 1 tablet (150 mg total) by mouth 2 (two) times daily.   rivaroxaban 20 MG Tabs tablet Commonly known as:  XARELTO TAKE 1 TABLET BY MOUTH EVERY DAY WITH SUPPER Start taking on:  11/01/2017 What changed:  These  instructions start on 11/01/2017. If you are unsure what to do until then, ask your doctor or other care provider.   simethicone 125 MG chewable tablet Commonly known as:  MYLICON Chew 494 mg by mouth 2 (two) times daily as needed for flatulence.   spironolactone 25 MG tablet Commonly known as:  ALDACTONE TAKE 1 TABLET BY MOUTH EVERY DAY IN THE MORNING   sucralfate 1 g tablet Commonly known as:  CARAFATE Take 1 tablet (1 g total) by mouth 4 (four) times daily -  with meals and at bedtime. What changed:  when to take this   thyroid 60 MG tablet Commonly known as:  ARMOUR Take 60 mg by mouth daily before breakfast.   tiZANidine 4 MG tablet Commonly known as:  ZANAFLEX TAKE 1 TABLET BY MOUTH THREE TIMES A DAY AS NEEDED What changed:    how much to take  how to take this  when to take this   trimethoprim 100 MG tablet Commonly known as:  TRIMPEX Take 100 mg by mouth daily.   URIBEL 118 MG Caps Take 1 capsule by mouth  daily as needed (uti).   vitamin C 250 MG tablet Commonly known as:  ASCORBIC ACID Take 250 mg by mouth daily.       No Known Allergies    Procedures/Studies: Ct Abdomen Pelvis W Contrast  Result Date: 10/28/2017 CLINICAL DATA:  Gastrointestinal bleed EXAM: CT ABDOMEN AND PELVIS WITH CONTRAST TECHNIQUE: Multidetector CT imaging of the abdomen and pelvis was performed using the standard protocol following bolus administration of intravenous contrast. CONTRAST:  123mL ISOVUE-300 IOPAMIDOL (ISOVUE-300) INJECTION 61%, 68mL ISOVUE-300 IOPAMIDOL (ISOVUE-300) INJECTION 61% COMPARISON:  10/17/2014 CT scan FINDINGS: Lower chest: Subsegmental atelectasis in the posterior basal segments of both lower lobes. Mitral valve calcification. Descending thoracic aortic atherosclerotic calcification. Hepatobiliary: Unremarkable Pancreas: Unremarkable Spleen: Unremarkable Adrenals/Urinary Tract: Unremarkable Stomach/Bowel: Prominent stool throughout the colon favors  constipation. Appendix surgically absent. Vascular/Lymphatic: Aortoiliac atherosclerotic vascular disease. Aortocaval node 0.7 cm in short axis on image 41/2. No overt pathologic adenopathy identified. Reproductive: Uterus absent.  Adnexa unremarkable. Other: No supplemental non-categorized findings. Musculoskeletal: Mildly low position of the anorectal junction suggesting pelvic floor laxity. 3 mm of grade 1 degenerative retrolisthesis at L2-3 with 5 mm grade 1 anterolisthesis at L4-5, accompanied by lumbar spondylosis and degenerative disc disease. IMPRESSION: 1. A specific cause for the patient's reported gastrointestinal bleed is not identified. 2.  Prominent stool throughout the colon favors constipation. 3. Mild pelvic floor laxity. 4. Aortic Atherosclerosis (ICD10-I70.0). Mitral valve calcification. 5. Lumbar spondylosis and degenerative disc disease. Electronically Signed   By: Van Clines M.D.   On: 10/28/2017 18:54      Subjective: Patient was seen and examined 10/29/2017, 11:03 AM Patient stable  Today. No acute distress.  No issues overnight Stable for discharge.  Discharge Exam:  Vitals:   10/28/17 1940 10/28/17 1946 10/29/17 0450 10/29/17 0500  BP: 135/76  133/84   Pulse: 70 70 86   Resp: 18 16 18    Temp: 98.6 F (37 C)  98.1 F (36.7 C)   TempSrc: Oral  Oral   SpO2: 96% 97% 97%   Weight:    68.9 kg (151 lb 14.4 oz)  Height:        General: Pt lying comfortably in bed & appears in no obvious distress. Cardiovascular: S1 & S2 heard, RRR, S1/S2 +. No murmurs, rubs, gallops or clicks. No JVD or pedal edema. Respiratory: Clear to auscultation without wheezing, rhonchi or crackles. No increased work of breathing. Abdominal:  Non distended, non tender & soft. No organomegaly or masses appreciated. Normal bowel sounds heard. CNS: Alert and oriented. No focal deficits. Extremities: no edema, no cyanosis    The results of significant diagnostics from this hospitalization  (including imaging, microbiology, ancillary and laboratory) are listed below for reference.     Microbiology: Recent Results (from the past 240 hour(s))  Culture, Urine     Status: Abnormal (Preliminary result)   Collection Time: 10/28/17  4:49 AM  Result Value Ref Range Status   Specimen Description   Final    URINE, CLEAN CATCH Performed at [facility], [place] [address], [place], [place] [address]    Special Requests   Final    NONE Performed at [facility], [place] [address] [place], [place] [address]    Culture (A)  Final    >=100,000 COLONIES/mL ESCHERICHIA COLI SUSCEPTIBILITIES TO FOLLOW Performed at [facility], [place] [address] [place], [place] [address]    Report Status PENDING  Incomplete     Labs: CBC: Recent Labs  Lab 10/27/17 2010 10/27/17 2258 10/28/17 0236 10/28/17 0642 10/29/17 0624  WBC 6.8 7.1 6.1 5.3 5.0  HGB 9.1* 8.5* 8.3* 8.8* 9.8*  HCT 27.3* 26.2* 25.3* 26.7* 30.3*  MCV 94.8 95.3 94.8 95.0 95.0  PLT 246 224 226 229 160   Basic Metabolic Panel: Recent Labs  Lab 10/27/17 2010 10/28/17 0642 10/29/17 0624  NA 144 144 142  K 4.3 3.7 3.8  CL 110 111 104  CO2 28 27 28   GLUCOSE 114* 104* 132*  BUN 38* 32* 19  CREATININE 0.86 0.73 0.77  CALCIUM 9.2 8.9 9.4   Liver Function Tests: Recent Labs  Lab 10/27/17 2010  AST 21  ALT 16  ALKPHOS 48  BILITOT 0.3  PROT 6.5  ALBUMIN 3.8   BNP (last 3 results) No results for input(s): BNP in the last 8760 hours. Cardiac Enzymes: No results for input(s): CKTOTAL, CKMB, CKMBINDEX, TROPONINI in the last 168 hours. CBG: Recent Labs  Lab 10/28/17 1205 10/28/17 1805 10/28/17 1943 10/28/17 2344 10/29/17 0452  GLUCAP 80 88 94 85 92   Hgb A1c No results for input(s): HGBA1C in the last 72 hours. Lipid Profile No results for input(s): CHOL, HDL, LDLCALC, TRIG, CHOLHDL, LDLDIRECT in the last 72 hours. Thyroid function studies No results for input(s): TSH,  T4TOTAL, T3FREE, THYROIDAB in the last 72 hours.  Invalid input(s): FREET3 Anemia work up No results for input(s): VITAMINB12, FOLATE, FERRITIN, TIBC, IRON, RETICCTPCT in the last 72 hours. Urinalysis    Component Value Date/Time   COLORURINE YELLOW 10/28/2017 0449   APPEARANCEUR HAZY (A) 10/28/2017 0449   LABSPEC 1.016 10/28/2017 0449   PHURINE 5.0 10/28/2017 0449   GLUCOSEU NEGATIVE 10/28/2017 0449   HGBUR SMALL (A) 10/28/2017 0449   BILIRUBINUR NEGATIVE 10/28/2017 0449   KETONESUR NEGATIVE 10/28/2017 0449   PROTEINUR NEGATIVE 10/28/2017 0449   NITRITE POSITIVE (A) 10/28/2017 0449   LEUKOCYTESUR MODERATE (A) 10/28/2017 0449    Time coordinating discharge: Over 30 minutes  SIGNED: Deatra James, MD, FACP, FHM. [facility],  Pager [phone]  If 7PM-7AM, please contact night-coverage [email or web address], Password [facility] 10/29/2017, 11:03 AM

## 2017-10-29 NOTE — Anesthesia Postprocedure Evaluation (Signed)
Anesthesia Post Note  Patient: Uzma Hellmer  Procedure(s) Performed: ESOPHAGOGASTRODUODENOSCOPY (EGD) WITH PROPOFOL (N/A )     Patient location during evaluation: PACU Anesthesia Type: MAC Level of consciousness: awake and alert Pain management: pain level controlled Vital Signs Assessment: post-procedure vital signs reviewed and stable Respiratory status: spontaneous breathing, nonlabored ventilation, respiratory function stable and patient connected to nasal cannula oxygen Cardiovascular status: blood pressure returned to baseline and stable Postop Assessment: no apparent nausea or vomiting Anesthetic complications: no    Last Vitals:  Vitals:   10/28/17 1946 10/29/17 0450  BP:  133/84  Pulse: 70 86  Resp: 16 18  Temp:  36.7 C  SpO2: 97% 97%    Last Pain:  Vitals:   10/29/17 0450  TempSrc: Oral  PainSc:                  Byron S

## 2017-10-29 NOTE — Progress Notes (Signed)
Patient given discharge instructions, and verbalized an understanding of all discharge instructions.  Patient agrees with discharge plan, and is being discharged in stable medical condition.  Patient given transportation via wheelchair. 

## 2017-10-29 NOTE — Progress Notes (Signed)
GI Discharge/Sign-Off Note  Subjective: Patient doing well with no further bleeding.  She has A. fib and was on Xarelto EGD yesterday by Dr. Watt Climes was negative suspected that she has lower GI bleed.  Hemoglobin rising, no transfusion.  Principal Problem:   Acute GI bleeding Active Problems:   OSA (obstructive sleep apnea)   Essential hypertension, benign   Acute blood loss anemia   Acute lower UTI   Results for orders placed or performed during the hospital encounter of 10/27/17 (from the past 72 hour(s))  Comprehensive metabolic panel     Status: Abnormal   Collection Time: 10/27/17  8:10 PM  Result Value Ref Range   Sodium 144 135 - 145 mmol/L   Potassium 4.3 3.5 - 5.1 mmol/L   Chloride 110 98 - 111 mmol/L    Comment: Please note change in reference range.   CO2 28 22 - 32 mmol/L   Glucose, Bld 114 (H) 70 - 99 mg/dL    Comment: Please note change in reference range.   BUN 38 (H) 8 - 23 mg/dL    Comment: Please note change in reference range.   Creatinine, Ser 0.86 0.44 - 1.00 mg/dL   Calcium 9.2 8.9 - 10.3 mg/dL   Total Protein 6.5 6.5 - 8.1 g/dL   Albumin 3.8 3.5 - 5.0 g/dL   AST 21 15 - 41 U/L   ALT 16 0 - 44 U/L    Comment: Please note change in reference range.   Alkaline Phosphatase 48 38 - 126 U/L   Total Bilirubin 0.3 0.3 - 1.2 mg/dL   GFR calc non Af Amer >60 >60 mL/min   GFR calc Af Amer >60 >60 mL/min    Comment: (NOTE) The eGFR has been calculated using the CKD EPI equation. This calculation has not been validated in all clinical situations. eGFR's persistently <60 mL/min signify possible Chronic Kidney Disease.    Anion gap 6 5 - 15    Comment: Performed at [facility], [place] [address], [place], [place] [address]  CBC     Status: Abnormal   Collection Time: 10/27/17  8:10 PM  Result Value Ref Range   WBC 6.8 4.0 - 10.5 K/uL   RBC 2.88 (L) 3.87 - 5.11 MIL/uL   Hemoglobin 9.1 (L) 12.0 - 15.0 g/dL   HCT 27.3 (L) 36.0 - 46.0 %   MCV  94.8 78.0 - 100.0 fL   MCH 31.6 26.0 - 34.0 pg   MCHC 33.3 30.0 - 36.0 g/dL   RDW 13.2 11.5 - 15.5 %   Platelets 246 150 - 400 K/uL    Comment: Performed at [facility], [place] [address], [place], [place] [address]  Type and screen [place]     Status: None   Collection Time: 10/27/17  8:10 PM  Result Value Ref Range   ABO/RH(D) A POS    Antibody Screen NEG    Sample Expiration      10/30/2017 Performed at [facility], [place] [address], [place], [place] [address]   ABO/Rh     Status: None   Collection Time: 10/27/17  8:10 PM  Result Value Ref Range   ABO/RH(D)      A POS Performed at [facility], [place] [address] [place], [place] [address]   POC occult blood, ED     Status: Abnormal   Collection Time: 10/27/17  8:17 PM  Result Value Ref Range  Fecal Occult Bld POSITIVE (A) NEGATIVE  Protime-INR     Status: Abnormal   Collection Time: 10/27/17  9:02 PM  Result Value Ref Range   Prothrombin Time 18.0 (H) 11.4 - 15.2 seconds   INR 1.50     Comment: Performed at [facility], [place] [address], [place], [place] [address]  CBC     Status: Abnormal   Collection Time: 10/27/17 10:58 PM  Result Value Ref Range   WBC 7.1 4.0 - 10.5 K/uL   RBC 2.75 (L) 3.87 - 5.11 MIL/uL   Hemoglobin 8.5 (L) 12.0 - 15.0 g/dL   HCT 26.2 (L) 36.0 - 46.0 %   MCV 95.3 78.0 - 100.0 fL   MCH 30.9 26.0 - 34.0 pg   MCHC 32.4 30.0 - 36.0 g/dL   RDW 13.3 11.5 - 15.5 %   Platelets 224 150 - 400 K/uL    Comment: Performed at [facility], [place] [address], [place], [place] [address]  Glucose, capillary     Status: None   Collection Time: 10/27/17 11:53 PM  Result Value Ref Range   Glucose-Capillary 94 70 - 99 mg/dL  CBC     Status: Abnormal   Collection Time: 10/28/17  2:36 AM  Result Value Ref Range   WBC 6.1 4.0 - 10.5 K/uL   RBC 2.67 (L) 3.87 - 5.11 MIL/uL   Hemoglobin 8.3 (L) 12.0 - 15.0  g/dL   HCT 25.3 (L) 36.0 - 46.0 %   MCV 94.8 78.0 - 100.0 fL   MCH 31.1 26.0 - 34.0 pg   MCHC 32.8 30.0 - 36.0 g/dL   RDW 13.3 11.5 - 15.5 %   Platelets 226 150 - 400 K/uL    Comment: Performed at [facility], [place] [address] [place], [place] [address]  Urinalysis, Routine w reflex microscopic     Status: Abnormal   Collection Time: 10/28/17  4:49 AM  Result Value Ref Range   Color, Urine YELLOW YELLOW   APPearance HAZY (A) CLEAR   Specific Gravity, Urine 1.016 1.005 - 1.030   pH 5.0 5.0 - 8.0   Glucose, UA NEGATIVE NEGATIVE mg/dL   Hgb urine dipstick SMALL (A) NEGATIVE   Bilirubin Urine NEGATIVE NEGATIVE   Ketones, ur NEGATIVE NEGATIVE mg/dL   Protein, ur NEGATIVE NEGATIVE mg/dL   Nitrite POSITIVE (A) NEGATIVE   Leukocytes, UA MODERATE (A) NEGATIVE   RBC / HPF 0-5 0 - 5 RBC/hpf   WBC, UA 21-50 0 - 5 WBC/hpf   Bacteria, UA MANY (A) NONE SEEN   Squamous Epithelial / LPF 0-5 0 - 5   Mucus PRESENT     Comment: Performed at [facility], [place] [address] [place], [place] [address]  Culture, Urine     Status: Abnormal (Preliminary result)   Collection Time: 10/28/17  4:49 AM  Result Value Ref Range   Specimen Description      URINE, CLEAN CATCH Performed at [facility], [place] [address], [place], [place] [address]    Special Requests      NONE Performed at [facility], [place] [address] [place], [place] [address]    Culture (A)     >=100,000 COLONIES/mL ESCHERICHIA COLI SUSCEPTIBILITIES TO FOLLOW Performed at [place] [address], [place],  [address]    Report Status PENDING   Glucose, capillary     Status: None   Collection Time: 10/28/17  4:52 AM  Result Value Ref Range   Glucose-Capillary 90 70 - 99 mg/dL  Basic metabolic panel     Status: Abnormal   Collection Time: 10/28/17  6:42 AM  Result Value Ref Range   Sodium 144 135 - 145 mmol/L   Potassium 3.7 3.5 - 5.1 mmol/L    Chloride 111 98 - 111 mmol/L    Comment: Please note change in reference range.   CO2 27 22 - 32 mmol/L   Glucose, Bld 104 (H) 70 - 99 mg/dL    Comment: Please note change in reference range.   BUN 32 (H) 8 - 23 mg/dL    Comment: Please note change in reference range.   Creatinine, Ser 0.73 0.44 - 1.00 mg/dL   Calcium 8.9 8.9 - 10.3 mg/dL   GFR calc non Af Amer >60 >60 mL/min   GFR calc Af Amer >60 >60 mL/min    Comment: (NOTE) The eGFR has been calculated using the CKD EPI equation. This calculation has not been validated in all clinical situations. eGFR's persistently <60 mL/min signify possible Chronic Kidney Disease.    Anion gap 6 5 - 15    Comment: Performed at [facility], [place] [address] [place], [place] [address]  CBC     Status: Abnormal   Collection Time: 10/28/17  6:42 AM  Result Value Ref Range   WBC 5.3 4.0 - 10.5 K/uL   RBC 2.81 (L) 3.87 - 5.11 MIL/uL   Hemoglobin 8.8 (L) 12.0 - 15.0 g/dL   HCT 26.7 (L) 36.0 - 46.0 %   MCV 95.0 78.0 - 100.0 fL   MCH 31.3 26.0 - 34.0 pg   MCHC 33.0 30.0 - 36.0 g/dL   RDW 13.2 11.5 - 15.5 %   Platelets 229 150 - 400 K/uL    Comment: Performed at [facility], [place] [address], [place], [place] [address]  Glucose, capillary     Status: None   Collection Time: 10/28/17 12:05 PM  Result Value Ref Range   Glucose-Capillary 80 70 - 99 mg/dL  Glucose, capillary     Status: None   Collection Time: 10/28/17  6:05 PM  Result Value Ref Range   Glucose-Capillary 88 70 - 99 mg/dL  Glucose, capillary     Status: None   Collection Time: 10/28/17  7:43 PM  Result Value Ref Range   Glucose-Capillary 94 70 - 99 mg/dL  Glucose, capillary     Status: None   Collection Time: 10/28/17 11:44 PM  Result Value Ref Range   Glucose-Capillary 85 70 - 99 mg/dL  Glucose, capillary     Status: None   Collection Time: 10/29/17  4:52 AM  Result Value Ref Range   Glucose-Capillary 92 70 - 99 mg/dL  CBC     Status:  Abnormal   Collection Time: 10/29/17  6:24 AM  Result Value Ref Range   WBC 5.0 4.0 - 10.5 K/uL   RBC 3.19 (L) 3.87 - 5.11 MIL/uL   Hemoglobin 9.8 (L) 12.0 - 15.0 g/dL   HCT 30.3 (L) 36.0 - 46.0 %   MCV 95.0 78.0 - 100.0 fL   MCH 30.7 26.0 - 34.0 pg   MCHC 32.3 30.0 - 36.0 g/dL   RDW 13.0 11.5 - 15.5 %   Platelets 263 150 - 400 K/uL    Comment: Performed at [facility], [place] [address], [place], [place] [address]  Basic metabolic panel     Status: Abnormal  Collection Time: 10/29/17  6:24 AM  Result Value Ref Range   Sodium 142 135 - 145 mmol/L   Potassium 3.8 3.5 - 5.1 mmol/L   Chloride 104 98 - 111 mmol/L    Comment: Please note change in reference range.   CO2 28 22 - 32 mmol/L   Glucose, Bld 132 (H) 70 - 99 mg/dL    Comment: Please note change in reference range.   BUN 19 8 - 23 mg/dL    Comment: Please note change in reference range.   Creatinine, Ser 0.77 0.44 - 1.00 mg/dL   Calcium 9.4 8.9 - 10.3 mg/dL   GFR calc non Af Amer >60 >60 mL/min   GFR calc Af Amer >60 >60 mL/min    Comment: (NOTE) The eGFR has been calculated using the CKD EPI equation. This calculation has not been validated in all clinical situations. eGFR's persistently <60 mL/min signify possible Chronic Kidney Disease.    Anion gap 10 5 - 15    Comment: Performed at [facility], [place] [address], [place], [place] [address]    Ct Abdomen Pelvis W Contrast  Result Date: 10/28/2017 CLINICAL DATA:  Gastrointestinal bleed EXAM: CT ABDOMEN AND PELVIS WITH CONTRAST TECHNIQUE: Multidetector CT imaging of the abdomen and pelvis was performed using the standard protocol following bolus administration of intravenous contrast. CONTRAST:  153m ISOVUE-300 IOPAMIDOL (ISOVUE-300) INJECTION 61%, 37mISOVUE-300 IOPAMIDOL (ISOVUE-300) INJECTION 61% COMPARISON:  10/17/2014 CT scan FINDINGS: Lower chest: Subsegmental atelectasis in the posterior basal segments of both lower lobes.  Mitral valve calcification. Descending thoracic aortic atherosclerotic calcification. Hepatobiliary: Unremarkable Pancreas: Unremarkable Spleen: Unremarkable Adrenals/Urinary Tract: Unremarkable Stomach/Bowel: Prominent stool throughout the colon favors constipation. Appendix surgically absent. Vascular/Lymphatic: Aortoiliac atherosclerotic vascular disease. Aortocaval node 0.7 cm in short axis on image 41/2. No overt pathologic adenopathy identified. Reproductive: Uterus absent.  Adnexa unremarkable. Other: No supplemental non-categorized findings. Musculoskeletal: Mildly low position of the anorectal junction suggesting pelvic floor laxity. 3 mm of grade 1 degenerative retrolisthesis at L2-3 with 5 mm grade 1 anterolisthesis at L4-5, accompanied by lumbar spondylosis and degenerative disc disease. IMPRESSION: 1. A specific cause for the patient's reported gastrointestinal bleed is not identified. 2.  Prominent stool throughout the colon favors constipation. 3. Mild pelvic floor laxity. 4. Aortic Atherosclerosis (ICD10-I70.0). Mitral valve calcification. 5. Lumbar spondylosis and degenerative disc disease. Electronically Signed   By: WaVan Clines.D.   On: 10/28/2017 18:54    _0 @ Xarelto on hold  GI DISCHARGE PLANNING:  Diet: As tolerated  Anticoagulation/Antiplatelet Rx Suggestions:  Xarelto on hold  GI Medications: Protonix 40 mg daily  Labs/Procedures Ordered: Our office will contact patient and make arrangements for CBC to be done in our office on Monday or Tuesday.  She will make arrangements for outpatient colonoscopy  GI FOLLOW UP:  Call [phone] make appointment.   Doctor:  Dr. MaLajoyce LauberMD   Pager [phone] no answer or after hours call [phone]

## 2017-10-30 LAB — URINE CULTURE

## 2017-11-06 ENCOUNTER — Emergency Department (HOSPITAL_COMMUNITY): Payer: Medicare PPO

## 2017-11-06 ENCOUNTER — Inpatient Hospital Stay (HOSPITAL_COMMUNITY)
Admission: EM | Admit: 2017-11-06 | Discharge: 2017-11-11 | DRG: 312 | Disposition: A | Discharge status: Home Health | Payer: Medicare PPO | Attending: Internal Medicine | Admitting: Internal Medicine

## 2017-11-06 ENCOUNTER — Encounter (HOSPITAL_COMMUNITY): Payer: Self-pay

## 2017-11-06 DIAGNOSIS — I10 Essential (primary) hypertension: Secondary | ICD-10-CM

## 2017-11-06 DIAGNOSIS — Z79899 Other long term (current) drug therapy: Secondary | ICD-10-CM | POA: Diagnosis not present

## 2017-11-06 DIAGNOSIS — Z7901 Long term (current) use of anticoagulants: Secondary | ICD-10-CM

## 2017-11-06 DIAGNOSIS — F0390 Unspecified dementia without behavioral disturbance: Secondary | ICD-10-CM | POA: Diagnosis present

## 2017-11-06 DIAGNOSIS — J9601 Acute respiratory failure with hypoxia: Secondary | ICD-10-CM

## 2017-11-06 DIAGNOSIS — D649 Anemia, unspecified: Secondary | ICD-10-CM | POA: Diagnosis not present

## 2017-11-06 DIAGNOSIS — Z79890 Hormone replacement therapy (postmenopausal): Secondary | ICD-10-CM

## 2017-11-06 DIAGNOSIS — I480 Paroxysmal atrial fibrillation: Secondary | ICD-10-CM | POA: Diagnosis not present

## 2017-11-06 DIAGNOSIS — G4733 Obstructive sleep apnea (adult) (pediatric): Secondary | ICD-10-CM | POA: Diagnosis present

## 2017-11-06 DIAGNOSIS — K219 Gastro-esophageal reflux disease without esophagitis: Secondary | ICD-10-CM | POA: Diagnosis present

## 2017-11-06 DIAGNOSIS — I5032 Chronic diastolic (congestive) heart failure: Secondary | ICD-10-CM | POA: Diagnosis present

## 2017-11-06 DIAGNOSIS — E039 Hypothyroidism, unspecified: Secondary | ICD-10-CM | POA: Diagnosis present

## 2017-11-06 DIAGNOSIS — R001 Bradycardia, unspecified: Secondary | ICD-10-CM

## 2017-11-06 DIAGNOSIS — I493 Ventricular premature depolarization: Secondary | ICD-10-CM | POA: Diagnosis present

## 2017-11-06 DIAGNOSIS — K922 Gastrointestinal hemorrhage, unspecified: Secondary | ICD-10-CM | POA: Diagnosis present

## 2017-11-06 DIAGNOSIS — Z9049 Acquired absence of other specified parts of digestive tract: Secondary | ICD-10-CM | POA: Diagnosis not present

## 2017-11-06 DIAGNOSIS — Z85820 Personal history of malignant melanoma of skin: Secondary | ICD-10-CM | POA: Diagnosis not present

## 2017-11-06 DIAGNOSIS — F329 Major depressive disorder, single episode, unspecified: Secondary | ICD-10-CM | POA: Diagnosis present

## 2017-11-06 DIAGNOSIS — Z87891 Personal history of nicotine dependence: Secondary | ICD-10-CM | POA: Diagnosis not present

## 2017-11-06 DIAGNOSIS — I110 Hypertensive heart disease with heart failure: Secondary | ICD-10-CM | POA: Diagnosis present

## 2017-11-06 DIAGNOSIS — E785 Hyperlipidemia, unspecified: Secondary | ICD-10-CM | POA: Diagnosis present

## 2017-11-06 DIAGNOSIS — I951 Orthostatic hypotension: Secondary | ICD-10-CM | POA: Diagnosis present

## 2017-11-06 DIAGNOSIS — I469 Cardiac arrest, cause unspecified: Secondary | ICD-10-CM | POA: Diagnosis not present

## 2017-11-06 DIAGNOSIS — I959 Hypotension, unspecified: Secondary | ICD-10-CM | POA: Diagnosis not present

## 2017-11-06 DIAGNOSIS — E876 Hypokalemia: Secondary | ICD-10-CM | POA: Diagnosis present

## 2017-11-06 DIAGNOSIS — Z90711 Acquired absence of uterus with remaining cervical stump: Secondary | ICD-10-CM | POA: Diagnosis not present

## 2017-11-06 DIAGNOSIS — R55 Syncope and collapse: Secondary | ICD-10-CM | POA: Diagnosis not present

## 2017-11-06 HISTORY — DX: Personal history of other medical treatment: Z92.89

## 2017-11-06 HISTORY — DX: Gastrointestinal hemorrhage, unspecified: K92.2

## 2017-11-06 HISTORY — DX: Headache: R51

## 2017-11-06 HISTORY — DX: Anemia, unspecified: D64.9

## 2017-11-06 HISTORY — DX: Malignant melanoma of skin, unspecified: C43.9

## 2017-11-06 HISTORY — DX: Headache, unspecified: R51.9

## 2017-11-06 HISTORY — DX: Unspecified osteoarthritis, unspecified site: M19.90

## 2017-11-06 HISTORY — DX: Gastro-esophageal reflux disease without esophagitis: K21.9

## 2017-11-06 HISTORY — DX: Thyrotoxicosis, unspecified without thyrotoxic crisis or storm: E05.90

## 2017-11-06 LAB — CBC WITH DIFFERENTIAL/PLATELET
ABS IMMATURE GRANULOCYTES: 0 10*3/uL (ref 0.0–0.1)
BASOS ABS: 0 10*3/uL (ref 0.0–0.1)
BASOS PCT: 1 %
EOS PCT: 2 %
Eosinophils Absolute: 0.1 10*3/uL (ref 0.0–0.7)
HCT: 23 % — ABNORMAL LOW (ref 36.0–46.0)
Hemoglobin: 7.3 g/dL — ABNORMAL LOW (ref 12.0–15.0)
Immature Granulocytes: 0 %
Lymphocytes Relative: 28 %
Lymphs Abs: 1.4 10*3/uL (ref 0.7–4.0)
MCH: 31.5 pg (ref 26.0–34.0)
MCHC: 31.7 g/dL (ref 30.0–36.0)
MCV: 99.1 fL (ref 78.0–100.0)
MONO ABS: 0.5 10*3/uL (ref 0.1–1.0)
Monocytes Relative: 11 %
NEUTROS ABS: 2.9 10*3/uL (ref 1.7–7.7)
Neutrophils Relative %: 58 %
PLATELETS: 191 10*3/uL (ref 150–400)
RBC: 2.32 MIL/uL — ABNORMAL LOW (ref 3.87–5.11)
RDW: 13.1 % (ref 11.5–15.5)
WBC: 5 10*3/uL (ref 4.0–10.5)

## 2017-11-06 LAB — POC OCCULT BLOOD, ED: FECAL OCCULT BLD: POSITIVE — AB

## 2017-11-06 LAB — COMPREHENSIVE METABOLIC PANEL
ALT: 15 U/L (ref 0–44)
ANION GAP: 8 (ref 5–15)
AST: 19 U/L (ref 15–41)
Albumin: 2.9 g/dL — ABNORMAL LOW (ref 3.5–5.0)
Alkaline Phosphatase: 35 U/L — ABNORMAL LOW (ref 38–126)
BUN: 19 mg/dL (ref 8–23)
CHLORIDE: 107 mmol/L (ref 98–111)
CO2: 24 mmol/L (ref 22–32)
Calcium: 8 mg/dL — ABNORMAL LOW (ref 8.9–10.3)
Creatinine, Ser: 1.14 mg/dL — ABNORMAL HIGH (ref 0.44–1.00)
GFR calc non Af Amer: 46 mL/min — ABNORMAL LOW (ref >60)
GFR, EST AFRICAN AMERICAN: 53 mL/min — AB (ref >60)
Glucose, Bld: 132 mg/dL — ABNORMAL HIGH (ref 70–99)
Potassium: 4.1 mmol/L (ref 3.5–5.1)
SODIUM: 139 mmol/L (ref 135–145)
Total Bilirubin: 0.6 mg/dL (ref 0.3–1.2)
Total Protein: 4.9 g/dL — ABNORMAL LOW (ref 6.5–8.1)

## 2017-11-06 LAB — URINALYSIS, ROUTINE W REFLEX MICROSCOPIC
Bilirubin Urine: NEGATIVE
Glucose, UA: NEGATIVE mg/dL
KETONES UR: NEGATIVE mg/dL
Nitrite: NEGATIVE
PROTEIN: NEGATIVE mg/dL
Specific Gravity, Urine: 1.016 (ref 1.005–1.030)
TRANS EPITHEL UA: 4
pH: 7 (ref 5.0–8.0)

## 2017-11-06 LAB — CORTISOL: CORTISOL PLASMA: 17.5 ug/dL

## 2017-11-06 LAB — APTT: aPTT: 32 seconds (ref 24–36)

## 2017-11-06 LAB — CBC
HCT: 28 % — ABNORMAL LOW (ref 36.0–46.0)
HEMOGLOBIN: 8.9 g/dL — AB (ref 12.0–15.0)
MCH: 31 pg (ref 26.0–34.0)
MCHC: 31.8 g/dL (ref 30.0–36.0)
MCV: 97.6 fL (ref 78.0–100.0)
PLATELETS: 223 10*3/uL (ref 150–400)
RBC: 2.87 MIL/uL — ABNORMAL LOW (ref 3.87–5.11)
RDW: 13.2 % (ref 11.5–15.5)
WBC: 6.6 10*3/uL (ref 4.0–10.5)

## 2017-11-06 LAB — I-STAT CG4 LACTIC ACID, ED: LACTIC ACID, VENOUS: 1.51 mmol/L (ref 0.5–1.9)

## 2017-11-06 LAB — ABO/RH: ABO/RH(D): A POS

## 2017-11-06 LAB — MRSA PCR SCREENING: MRSA by PCR: NEGATIVE

## 2017-11-06 LAB — PROCALCITONIN

## 2017-11-06 LAB — I-STAT TROPONIN, ED: Troponin i, poc: 0 ng/mL (ref 0.00–0.08)

## 2017-11-06 LAB — PROTIME-INR
INR: 1.16
PROTHROMBIN TIME: 14.7 s (ref 11.4–15.2)

## 2017-11-06 LAB — TSH: TSH: 1.341 u[IU]/mL (ref 0.350–4.500)

## 2017-11-06 LAB — MAGNESIUM: MAGNESIUM: 2 mg/dL (ref 1.7–2.4)

## 2017-11-06 LAB — PREPARE RBC (CROSSMATCH)

## 2017-11-06 MED ORDER — SODIUM CHLORIDE 0.9 % IV SOLN
10.0000 mL/h | Freq: Once | INTRAVENOUS | Status: AC
  Administered 2017-11-06: 10 mL/h via INTRAVENOUS

## 2017-11-06 MED ORDER — ONDANSETRON HCL 4 MG/2ML IJ SOLN: 4.0000 mg | Freq: Four times a day (QID) | INTRAMUSCULAR | Status: DC | PRN

## 2017-11-06 MED ORDER — PIPERACILLIN-TAZOBACTAM 3.375 G IVPB
3.3750 g | Freq: Three times a day (TID) | INTRAVENOUS | Status: DC
  Administered 2017-11-06 – 2017-11-08 (×5): 3.375 g via INTRAVENOUS
  Filled 2017-11-06 (×7): qty 50

## 2017-11-06 MED ORDER — NOREPINEPHRINE 4 MG/250ML-% IV SOLN
4.0000 ug/min | Freq: Once | INTRAVENOUS | Status: AC
  Administered 2017-11-06: 4 ug/min via INTRAVENOUS
  Administered 2017-11-06: 2 ug/min via INTRAVENOUS
  Filled 2017-11-06: qty 250

## 2017-11-06 MED ORDER — SODIUM CHLORIDE 0.9 % IV SOLN
Freq: Once | INTRAVENOUS | Status: AC
Start: 2017-11-06 — End: 2017-11-06
  Administered 2017-11-06: 250 mL/h via INTRAVENOUS

## 2017-11-06 MED ORDER — POTASSIUM CHLORIDE IN NACL 20-0.9 MEQ/L-% IV SOLN
INTRAVENOUS | Status: DC
  Administered 2017-11-06: 100 mL/h via INTRAVENOUS
  Administered 2017-11-07 (×2): via INTRAVENOUS
  Filled 2017-11-06 (×4): qty 1000

## 2017-11-06 MED ORDER — IOPAMIDOL (ISOVUE-370) INJECTION 76%
100.0000 mL | Freq: Once | INTRAVENOUS | Status: AC | PRN
  Administered 2017-11-06: 100 mL via INTRAVENOUS

## 2017-11-06 MED ORDER — NOREPINEPHRINE 4 MG/250ML-% IV SOLN
0.0000 ug/min | Freq: Once | INTRAVENOUS | Status: AC
  Administered 2017-11-06: 2 ug/min via INTRAVENOUS
  Filled 2017-11-06: qty 250

## 2017-11-06 MED ORDER — VANCOMYCIN HCL 500 MG IV SOLR
500.0000 mg | Freq: Two times a day (BID) | INTRAVENOUS | Status: DC
  Administered 2017-11-07 – 2017-11-08 (×4): 500 mg via INTRAVENOUS
  Filled 2017-11-06 (×4): qty 500

## 2017-11-06 MED ORDER — IOPAMIDOL (ISOVUE-370) INJECTION 76%
INTRAVENOUS | Status: AC
  Administered 2017-11-06: 16:00:00
  Filled 2017-11-06: qty 100

## 2017-11-06 MED ORDER — VANCOMYCIN HCL IN DEXTROSE 1-5 GM/200ML-% IV SOLN: 1000.0000 mg | Freq: Once | INTRAVENOUS | Status: DC

## 2017-11-06 MED ORDER — FAMOTIDINE IN NACL 20-0.9 MG/50ML-% IV SOLN
20.0000 mg | Freq: Two times a day (BID) | INTRAVENOUS | Status: DC
  Administered 2017-11-06 – 2017-11-09 (×6): 20 mg via INTRAVENOUS
  Filled 2017-11-06 (×6): qty 50

## 2017-11-06 MED ORDER — PIPERACILLIN-TAZOBACTAM 3.375 G IVPB 30 MIN: 3.3750 g | Freq: Once | INTRAVENOUS | Status: DC

## 2017-11-06 MED ORDER — SODIUM CHLORIDE 0.9 % IV BOLUS
500.0000 mL | Freq: Once | INTRAVENOUS | Status: AC
  Administered 2017-11-06: 500 mL via INTRAVENOUS

## 2017-11-06 MED ORDER — SODIUM CHLORIDE 0.9 % IV SOLN
Freq: Once | INTRAVENOUS | Status: AC
  Administered 2017-11-06: 75 mL/h via INTRAVENOUS

## 2017-11-06 MED ORDER — VANCOMYCIN HCL IN DEXTROSE 1-5 GM/200ML-% IV SOLN
1000.0000 mg | INTRAVENOUS | Status: AC
  Administered 2017-11-06: 1000 mg via INTRAVENOUS
  Filled 2017-11-06: qty 200

## 2017-11-06 MED ORDER — PIPERACILLIN-TAZOBACTAM 3.375 G IVPB
3.3750 g | INTRAVENOUS | Status: AC
  Administered 2017-11-06: 3.375 g via INTRAVENOUS
  Filled 2017-11-06: qty 50

## 2017-11-06 MED ORDER — SODIUM CHLORIDE 0.9 % IV SOLN
250.0000 mL | INTRAVENOUS | Status: DC | PRN
  Administered 2017-11-09: 250 mL via INTRAVENOUS

## 2017-11-06 MED ORDER — SODIUM CHLORIDE 0.9 % IV SOLN
INTRAVENOUS | Status: DC | PRN
Start: 2017-11-06 — End: 2017-11-08
  Administered 2017-11-07: 13:00:00 via INTRA_ARTERIAL

## 2017-11-06 NOTE — ED Notes (Signed)
To CT with RN, monitor.

## 2017-11-06 NOTE — ED Notes (Addendum)
Dr. Reather Converse at bedside with [place].

## 2017-11-06 NOTE — ED Provider Notes (Signed)
[facility] EMERGENCY DEPARTMENT Provider Note   CSN: [id number] Arrival date & time: 11/06/17  1113    History   Chief Complaint Chief Complaint  Patient presents with  . Chest Pain  . post CPR    HPI Cynthia [name] is a 77 y.o. female with a history of A-fib on Xarelto, HTN, CHF with diastolic dysfuntion, HLD, acute GI bleeding, and OSA who presents after PEA.  Patient reports that she was feeling dizzy today while making breakfast. She became unconscious and was found by her husband. EMS was called, wide complex PEA noted, and performed 3 minutes of CPR. Patient was initially unconscious but upon arrival to the ED patient was pale, diaphoretic, and responsive. Patient received 1 mg atropine PTA. Initially bradicardic at low 40s but atropine brought to 60/70s.  Patient reports several episodes of light-headedness. Patient denies recent chest pain, SOB, headache, and leg-swelling.   Per chart review, patient was recently discharged on 10/29/17 (8 days prior to presentation) due to GI bleed/melena. She was kept NPO and was started on IV Protonix. GI consulted and completed EGD showing no active site of bleeding identified. Hb/Hct remained stable at 8-9/26-30. She was also noted to have a UTI and was started on Rocephin, cultures did not grow anything x2 days, patient was switched to oral antibiotics of ciprofloxacin.     Past Medical History:  Diagnosis Date  . A-fib ([place])   . Cancer ([place])    Melanoma  . Hearing loss   . Hypertension     Patient Active Problem List   Diagnosis Date Noted  . Acute lower UTI 10/29/2017  . Acute GI bleeding 10/27/2017  . Acute blood loss anemia 10/27/2017  . Abnormal stress test   . Chest pain 11/23/2016  . Hyperlipidemia 11/23/2016  . GERD (gastroesophageal reflux disease) 11/23/2016  . History of recurrent UTIs 11/23/2016  . Polyneuropathy 10/30/2016  . Bursitis, subacromial 10/30/2016  . Numbness 10/30/2016  .  Arthritis, senescent 01/03/2016  . Left knee pain 01/03/2016  . Pain in joint, ankle and foot 02/28/2015  . Dry mouth 08/07/2014  . Dry eyes 08/07/2014  . Other headache syndrome 06/25/2014  . Sinusitis, chronic 06/25/2014  . Neck pain 06/25/2014  . OSA (obstructive sleep apnea) 06/25/2014  . Essential hypertension, benign 06/25/2014    Past Surgical History:  Procedure Laterality Date  . APPENDECTOMY    . CRANIECTOMY FOR EXCISION OF ACOUSTIC NEUROMA    . ESOPHAGOGASTRODUODENOSCOPY (EGD) WITH PROPOFOL N/A 10/28/2017   Procedure: ESOPHAGOGASTRODUODENOSCOPY (EGD) WITH PROPOFOL;  Surgeon: Clarene Essex, MD;  Location: [facility];  Service: Endoscopy;  Laterality: N/A;  . LEFT HEART CATH AND CORONARY ANGIOGRAPHY N/A 11/24/2016   Procedure: LEFT HEART CATH AND CORONARY ANGIOGRAPHY;  Surgeon: Jettie Booze, MD;  Location: [facility];  Service: Cardiovascular;  Laterality: N/A;  . PARTIAL HYSTERECTOMY    . TONSILLECTOMY       OB History   None      Home Medications    Prior to Admission medications   Medication Sig Start Date End Date Taking? Authorizing Provider  Alpha-Lipoic Acid (LIPOIC ACID PO) Take 2 tablets by mouth 2 (two) times daily.    [provider]  atorvastatin (LIPITOR) 10 MG tablet TAKE 1 TABLET BY MOUTH EVERY DAY AT 6 PM 12/16/16   Lelon Perla, MD  CALCIUM PO Take 1 tablet by mouth 2 (two) times daily.    [provider]  cholecalciferol (VITAMIN D)  1000 UNITS tablet Take 1,000 Units by mouth daily.    [provider]  CRANBERRY CONCENTRATE PO Take 1 capsule by mouth daily.    [provider]  cycloSPORINE (RESTASIS) 0.05 % ophthalmic emulsion Place 1 drop into both eyes 2 (two) times daily.    [provider]  diltiazem (CARDIZEM CD) 120 MG 24 hr capsule Take 1 capsule (120 mg total) by mouth daily. 12/16/16   Lelon Perla, MD  DULoxetine (CYMBALTA) 30 MG capsule TAKE 1 CAPSULE (30 MG TOTAL) BY MOUTH  2 (TWO) TIMES DAILY. 09/09/17   Sater, Nanine Means, MD  fluocinonide cream (LIDEX) 0.05 % APPLY ON THE SKIN TWICE A DAY X 2 WEEKS, THEN 1 WEEK OFF AND REPEAT 10/08/17   [provider]  fluticasone (FLONASE) 50 MCG/ACT nasal spray Place 2 sprays into both nostrils at bedtime.    [provider]  glucosamine-chondroitin 500-400 MG tablet Take 2 tablets by mouth daily with lunch.    [provider]  losartan (COZAAR) 100 MG tablet Take 1 tablet (100 mg total) by mouth daily. Patient taking differently: Take 50-100 mg by mouth daily. > 140 Take 1 tablet & < 140 Tablet 1/2 tablet 03/09/17   Lelon Perla, MD  Meth-Hyo-M Bl-Na Phos-Ph Sal (URIBEL) 118 MG CAPS Take 1 capsule by mouth daily as needed (uti).  01/25/15   [provider]  Multiple Vitamin (MULTIVITAMIN WITH MINERALS) TABS tablet Take 1 tablet by mouth daily.    [provider]  multivitamin-lutein (OCUVITE-LUTEIN) CAPS capsule Take 1 capsule by mouth daily with lunch.    [provider]  pantoprazole (PROTONIX) 40 MG tablet Take 40 mg by mouth daily before breakfast.  06/12/14   [provider]  ranitidine (ZANTAC) 150 MG tablet Take 1 tablet (150 mg total) by mouth 2 (two) times daily. 12/22/16 10/27/17  Duffy Bruce, MD  rivaroxaban (XARELTO) 20 MG TABS tablet TAKE 1 TABLET BY MOUTH EVERY DAY WITH SUPPER 11/01/17   Shahmehdi, Valeria Batman, MD  simethicone (MYLICON) 983 MG chewable tablet Chew 125 mg by mouth 2 (two) times daily as needed for flatulence.    [provider]  spironolactone (ALDACTONE) 25 MG tablet TAKE 1 TABLET BY MOUTH EVERY DAY IN THE MORNING 07/21/17   [provider]  sucralfate (CARAFATE) 1 g tablet Take 1 tablet (1 g total) by mouth 4 (four) times daily -  with meals and at bedtime. Patient taking differently: Take 1 g by mouth 3 (three) times daily.  12/22/16   Duffy Bruce, MD  thyroid (ARMOUR) 60 MG tablet Take 60 mg by mouth daily before  breakfast.    [provider]  tiZANidine (ZANAFLEX) 4 MG tablet TAKE 1 TABLET BY MOUTH THREE TIMES A DAY AS NEEDED Patient taking differently: TAKE 1 TABLET BY MOUTH THREE TIMES A DAY AS NEEDED SPASMS 08/04/17   Sater, Nanine Means, MD  trimethoprim (TRIMPEX) 100 MG tablet Take 100 mg by mouth daily. 07/16/17   [provider]  vitamin C (ASCORBIC ACID) 250 MG tablet Take 250 mg by mouth daily.    [provider]    Family History Family History  Problem Relation Age of Onset  . Congestive Heart Failure Mother   . Heart disease Father        History of heart attacks at a later age.      Social History Social History   Tobacco Use  . Smoking status: Never Smoker  . Smokeless tobacco:  Never Used  Substance Use Topics  . Alcohol use: Yes  . Drug use: No     Allergies   Patient has no known allergies.   Review of Systems Review of Systems  Constitutional: Positive for fatigue. Negative for activity change, appetite change, chills and fever.  HENT: Negative for congestion and sore throat.   Eyes: Negative for visual disturbance.  Respiratory: Negative for shortness of breath.   Cardiovascular: Negative for chest pain.  Gastrointestinal: Negative for abdominal pain.  Genitourinary: Negative for dysuria and hematuria.  Neurological: Positive for light-headedness. Negative for dizziness, weakness, numbness and headaches.  All other systems reviewed and are negative.    Physical Exam Updated Vital Signs BP 130/63   Pulse (!) 59   Temp (!) 96.5 F (35.8 C) (Rectal) Comment: with stool.  Resp 15   SpO2 99%   Physical Exam  Constitutional: She is oriented to person, place, and time. She appears well-developed and well-nourished.  HENT:  Head: Normocephalic.  Eyes: Pupils are equal, round, and reactive to light. EOM are normal.  Cardiovascular: Regular rhythm, intact distal pulses and normal pulses. Bradycardia present.  Pulmonary/Chest: Effort  normal and breath sounds normal.  Abdominal: Soft. Bowel sounds are normal.  No BRBPR  Genitourinary: Rectal exam shows guaiac positive stool.  Musculoskeletal:       Right lower leg: Normal.       Left lower leg: Normal.  Neurological: She is alert and oriented to person, place, and time.  Moving all 4 extremities without difficulty. Gross sensation in tact.   Skin: Skin is warm and dry. Capillary refill takes 2 to 3 seconds.  Psychiatric:  Sleepy  Nursing note and vitals reviewed.    ED Treatments / Results  Labs (all labs ordered are listed, but only abnormal results are displayed) Labs Reviewed  CBC WITH DIFFERENTIAL/PLATELET - Abnormal; Notable for the following components:      Result Value   RBC 2.32 (*)    Hemoglobin 7.3 (*)    HCT 23.0 (*)    All other components within normal limits  COMPREHENSIVE METABOLIC PANEL - Abnormal; Notable for the following components:   Glucose, Bld 132 (*)    Creatinine, Ser 1.14 (*)    Calcium 8.0 (*)    Total Protein 4.9 (*)    Albumin 2.9 (*)    Alkaline Phosphatase 35 (*)    GFR calc non Af Amer 46 (*)    GFR calc Af Amer 53 (*)    All other components within normal limits  POC OCCULT BLOOD, ED - Abnormal; Notable for the following components:   Fecal Occult Bld POSITIVE (*)    All other components within normal limits  CULTURE, BLOOD (ROUTINE X 2)  CULTURE, BLOOD (ROUTINE X 2)  MAGNESIUM  TSH  URINALYSIS, ROUTINE W REFLEX MICROSCOPIC  I-STAT CG4 LACTIC ACID, ED  I-STAT TROPONIN, ED  I-STAT VENOUS BLOOD GAS, ED  TYPE AND SCREEN  ABO/RH  PREPARE RBC (CROSSMATCH)    EKG None  Radiology Ct Head Wo Contrast  Result Date: 11/06/2017 CLINICAL DATA:  77 year old female with altered level of consciousness. CPR. EXAM: CT HEAD WITHOUT CONTRAST TECHNIQUE: Contiguous axial images were obtained from the base of the skull through the vertex without intravenous contrast. COMPARISON:  04/16/2014 CT and prior studies FINDINGS: Brain:  No evidence of acute infarction, hemorrhage, hydrocephalus, extra-axial collection or mass lesion/mass effect. Probable mild chronic small-vessel white matter ischemic changes again noted. Mild ventriculomegaly again identified. Vascular: Mild  intracranial atherosclerotic calcifications identified. Skull: No acute abnormality. LEFT temporal/occipital craniectomy changes again noted. Sinuses/Orbits: Chronic changes of the LEFT maxillary and RIGHT sphenoid sinuses noted. No acute abnormality. Other: None IMPRESSION: 1. No evidence of acute intracranial abnormality. Probable mild chronic small-vessel white matter ischemic changes. Electronically Signed   By: Margarette Canada M.D.   On: 11/06/2017 13:19   Dg Chest Portable 1 View  Result Date: 11/06/2017 CLINICAL DATA:  Cardiac arrest. EXAM: PORTABLE CHEST 1 VIEW COMPARISON:  01/29/2017 prior radiographs FINDINGS: Cardiomediastinal silhouette is unremarkable and unchanged. There is no evidence of focal airspace disease, pulmonary edema, suspicious pulmonary nodule/mass, pleural effusion, or pneumothorax. No acute bony abnormalities are identified. IMPRESSION: No active disease. Electronically Signed   By: Margarette Canada M.D.   On: 11/06/2017 12:11   Ct Angio Chest/abd/pel For Dissection W And/or Wo Contrast  Result Date: 11/06/2017 CLINICAL DATA:  Unresponsive and hypotensive. EXAM: CT ANGIOGRAPHY CHEST, ABDOMEN AND PELVIS TECHNIQUE: Multidetector CT imaging through the chest, abdomen and pelvis was performed using the standard protocol during bolus administration of intravenous contrast. Multiplanar reconstructed images and MIPs were obtained and reviewed to evaluate the vascular anatomy. CONTRAST:  113mL ISOVUE-370 IOPAMIDOL (ISOVUE-370) INJECTION 76% COMPARISON:  Prior CT of the abdomen and pelvis with contrast on 10/28/2017 FINDINGS: CTA CHEST FINDINGS Cardiovascular: Ascending thoracic aorta shows mild dilatation measuring 4.1-4.2 cm in greatest diameter. The  aortic root, arch and descending thoracic aorta are of normal caliber. There is mild scattered calcified plaque. No dissection. Proximal great vessels are tortuous but shows no evidence of significant obstructive disease. Central pulmonary arteries are normal in caliber. No evidence of pulmonary embolism with satisfactory opacification of the pulmonary arteries. The heart size is at the upper limits of normal. No pericardial fluid. No significant calcified plaque in the coronary arteries with a single tiny calcified plaque present at the origin of a diagonal branch off of the LAD. Mediastinum/Nodes: No enlarged mediastinal, hilar, or axillary lymph nodes. Thyroid gland, trachea, and esophagus demonstrate no significant findings. Lungs/Pleura: Mild bibasilar atelectasis/scarring. There is no evidence of pulmonary edema, consolidation, pneumothorax, nodule or pleural fluid. Musculoskeletal: No chest wall abnormality. No acute or significant osseous findings. Review of the MIP images confirms the above findings. CTA ABDOMEN AND PELVIS FINDINGS VASCULAR Aorta: The abdominal aorta is normal in caliber and shows no evidence of aneurysm or dissection. Celiac: Normally patent.  Normal distal branching anatomy. SMA: Normally patent. Renals: Two separate renal arteries on the right and two separate renal arteries on the left. No evidence of renal artery stenosis. There is some irregularity in contour of the mid to distal aspect of the main upper right renal artery consistent with fibromuscular dysplasia. This vessel also demonstrates a small focal aneurysm at a branch point near the superior renal hilum measuring approximately 9 x 10 x 10 mm and demonstrating rim of internal mural thrombus. IMA: Normally patent. Inflow: Bilateral iliac and common femoral arteries show normal patency. No aneurysm or significant obstructive disease. Review of the MIP images confirms the above findings. NON-VASCULAR Hepatobiliary: No focal liver  abnormality is seen. No gallstones, gallbladder wall thickening, or biliary dilatation. Pancreas: Unremarkable. No pancreatic ductal dilatation or surrounding inflammatory changes. Spleen: Normal in size without focal abnormality. Adrenals/Urinary Tract: Adrenal glands are unremarkable. Kidneys are normal, without renal calculi, focal lesion, or hydronephrosis. Bladder is unremarkable. Stomach/Bowel: Bowel shows no evidence of obstruction. No obvious inflammatory process. Moderate fecal material throughout the colon. Mild amount of dependent higher density material is  present in the stomach confirmed by the unenhanced portion of the study. The stomach is not distended. No free air. Lymphatic: No enlarged lymph nodes identified in the abdomen or pelvis. Reproductive: Status post hysterectomy. No adnexal masses. Other: No abdominal wall hernia or abnormality. No abscess or ascites. Musculoskeletal: Degenerative disc disease present of the lumbar spine. Review of the MIP images confirms the above findings. IMPRESSION: 1. No acute aortic pathology identified. No evidence of aortic dissection. 2. No evidence of pulmonary embolism. 3. Mild dilatation of the ascending thoracic aorta measuring 4.1-4.2 cm in greatest diameter. Recommend annual imaging followup by CTA or MRA. This recommendation follows 2010 ACCF/AHA/AATS/ACR/ASA/SCA/SCAI/SIR/STS/SVM Guidelines for the Diagnosis and Management of Patients with Thoracic Aortic Disease. Circulation. 2010; 121: Y865-H846 4. Evidence of fibromuscular dysplasia involving the dominant of 2 right renal arteries. This vessel also shows a focal small aneurysm in its distal segment at a bifurcation point measuring 10 mm in diameter. Electronically Signed   By: Aletta Edouard M.D.   On: 11/06/2017 13:41    Procedures Procedures (including critical care time)  Medications Ordered in ED Medications  0.9 %  sodium chloride infusion (250 mL/hr Intravenous New Bag/Given 11/06/17  1145)  0.9 %  sodium chloride infusion (has no administration in time range)  piperacillin-tazobactam (ZOSYN) IVPB 3.375 g (has no administration in time range)  vancomycin (VANCOCIN) IVPB 1000 mg/200 mL premix (has no administration in time range)  sodium chloride 0.9 % bolus 500 mL (0 mLs Intravenous Stopped 11/06/17 1212)  norepinephrine (LEVOPHED) 4mg  in D5W 212mL premix infusion (2 mcg/min Intravenous Rate/Dose Change 11/06/17 1201)     Initial Impression / Assessment and Plan / ED Course  I have reviewed the triage vital signs and the nursing notes.  Pertinent labs & imaging results that were available during my care of the patient were reviewed by me and considered in my medical decision making (see chart for details).  Cynthia [name] is a 77 y.o. female with a history of A-fib on Xarelto, HTN, CHF with diastolic dysfuntion, HLD, acute GI bleeding, and OSA who presents after PEA. EMS performed 3 minutes of CPR with return of spontaneous circulation. Patient was initially bradycardic at 50's-60's with low blood pressure 80's-90's/40's-50's. Rectal temp was 33F. Patient was given 1L IVF without improvement of her blood pressure. Norepinephrine 4 Mcg started with improvement to 120's/50's. Norepinephrine was titrated down to 2 mcg. Patient was on 15 L O2 on arrival. Oxygen titrated down and patient saturating well at 98-100% on room air. Patient sent to CT and began to have episodes of hypotension and norepinephrine was again titrated up to 4 mcg. EKG showed sinus rhythm with nonspecific intraventricular conduction delay. CXR, head CT, CT of chest/abdomen/pelvis all unremarkable. ICU consulted for admission.   Differential diagnosis for patient with PEA and subsequent bradycardia and hypotension includes hypovolemia 2/2 GI bleed, sepsis 2/2 UTI vs unknown source, ACS, and metabolic abnormality (ie hypo-/hyper-kalemia).   Patient started on Zosyn and Vanc. Blood cultures x 2 ordered. Patient  currently on Norepinephrine 2 Mcg and maintenance IVF's. Patient will be admitted to ICU for further management of hypotension and bradycardia.      Final Clinical Impressions(s) / ED Diagnoses   Final diagnoses:  Cardiac arrest ([place])  Hypotension, unspecified hypotension type  Symptomatic anemia  Symptomatic bradycardia    ED Discharge Orders    None       Carroll Sage, MD 11/06/17 1525    Elnora Morrison, MD 11/08/17 [phone]  Elnora Morrison, MD 11/08/17 [phone]

## 2017-11-06 NOTE — ED Triage Notes (Signed)
Pt arrives EMS from home with c/o chest pain after 3 minutes of CPR. Call was initially unconscious but pt pale and diaphoretic, semi responsive then became unresponsive when moved to EMS stretcher. Wide complex PEA noted during CPR Pt is alert and reponsive. Pt received 1 mg atropine PTA and arrives with 18 at r AC and 16 R EJ. Initially bradicardic at low 40s but atropine brought to 60/70s.

## 2017-11-06 NOTE — Procedures (Signed)
Arterial Catheter Insertion Procedure Note Cynthia [name] [id number] 05/30/1940  Procedure: Insertion of Arterial Catheter  Indications: Blood pressure monitoring  Procedure Details Consent: Risks of procedure as well as the alternatives and risks of each were explained to the (patient/caregiver).  Consent for procedure obtained. Time Out: Verified patient identification, verified procedure, site/side was marked, verified correct patient position, special equipment/implants available, medications/allergies/relevent history reviewed, required imaging and test results available.  Performed  Maximum sterile technique was used including antiseptics, cap, gloves, gown, hand hygiene, mask and sheet. Skin prep: Chlorhexidine; local anesthetic administered 20 gauge catheter was inserted into right radial artery using the Seldinger technique. ULTRASOUND GUIDANCE USED: NO Evaluation Blood flow good; BP tracing good. Complications: No apparent complications.   Rudene Re 11/06/2017

## 2017-11-06 NOTE — ED Notes (Signed)
Dr. Mikki Santee at bedside

## 2017-11-06 NOTE — ED Notes (Signed)
Dr Zavitz at bedside  

## 2017-11-06 NOTE — H&P (Signed)
PULMONARY / CRITICAL CARE MEDICINE   Name: Cynthia [name] MRN: [id number] DOB: Dec 10, 1940    ADMISSION DATE:  11/06/2017     CHIEF COMPLAINT: The patient presented from home pale and diaphoretic.  HISTORY OF PRESENT ILLNESS:   tHE patient has been feeling sleepy and weak the past few days. She acutall received about 2 minutes of CPR when her pulse could not be ascultated upon arrival. Her gheart rate after resuscitation was in the 40s  and she responded to 1 mg of Atropine. The patient had been hospitalized up to 1 week ago for a gi bleed. She underwent an UGI at that timewhich apparently did hn ot show the source of bleeding. The patient was to make an appt. As an poutpt folr a colonoscopy after discharge. She has a history of Atrial fib for which she usually takes Xarelto. the patient has not taken it since her last admission. She denieds bloody r black stools lately. The patient is currently on 2 mcg/min of levophed.  PAST MEDICAL HISTORY :  She  has a past medical history of A-fib ([place]), Cancer ([place]), Hearing loss, and Hypertension.  PAST SURGICAL HISTORY: She  has a past surgical history that includes Partial hysterectomy; Tonsillectomy; Appendectomy; Craniectomy for excision of acoustic neuroma; LEFT HEART CATH AND CORONARY ANGIOGRAPHY (N/A, 11/24/2016); and Esophagogastroduodenoscopy (egd) with propofol (N/A, 10/28/2017).  No Known Allergies  No current facility-administered medications on file prior to encounter.    Current Outpatient Medications on File Prior to Encounter  Medication Sig  . Alpha-Lipoic Acid (LIPOIC ACID PO) Take 600 mg by mouth 2 (two) times daily.   Marland Kitchen atorvastatin (LIPITOR) 10 MG tablet TAKE 1 TABLET BY MOUTH EVERY DAY AT 6 PM  . CALCIUM PO Take 600 mg by mouth 2 (two) times daily.   . cetirizine (ZYRTEC) 10 MG tablet Take 10 mg by mouth daily.  . cholecalciferol (VITAMIN D) 1000 UNITS tablet Take 1,000 Units by mouth daily.  Marland Kitchen CRANBERRY CONCENTRATE PO  Take 650 mg by mouth daily.   . cycloSPORINE (RESTASIS) 0.05 % ophthalmic emulsion Place 1 drop into both eyes 2 (two) times daily.  Marland Kitchen diltiazem (CARDIZEM CD) 120 MG 24 hr capsule Take 1 capsule (120 mg total) by mouth daily.  . DULoxetine (CYMBALTA) 30 MG capsule TAKE 1 CAPSULE (30 MG TOTAL) BY MOUTH 2 (TWO) TIMES DAILY.  Marland Kitchen GLUCOSAMINE-CHONDROITIN PO Take 2 tablets by mouth daily with lunch. Glucosamine 1530m Chondroitin 12032m . losartan (COZAAR) 100 MG tablet Take 1 tablet (100 mg total) by mouth daily.  . Lutein-Zeaxanthin 25-5 MG CAPS Take 1 tablet by mouth daily.  . Melatonin 5 MG TABS Take 5 mg by mouth at bedtime.  . Meth-Hyo-M Bl-Na Phos-Ph Sal (URIBEL PO) Take 36 mg by mouth daily.  . Multiple Vitamin (MULTIVITAMIN WITH MINERALS) TABS tablet Take 1 tablet by mouth daily.  . Omega-3 1000 MG CAPS Take 1,000 mg by mouth daily.  . pantoprazole (PROTONIX) 40 MG tablet Take 40 mg by mouth daily before breakfast.   . ranitidine (ZANTAC) 150 MG tablet Take 1 tablet (150 mg total) by mouth 2 (two) times daily.  . Marland Kitchenpironolactone (ALDACTONE) 25 MG tablet TAKE 1 TABLET BY MOUTH EVERY DAY IN THE MORNING  . thyroid (ARMOUR) 60 MG tablet Take 60 mg by mouth daily before breakfast.  . tiZANidine (ZANAFLEX) 4 MG tablet TAKE 1 TABLET BY MOUTH THREE TIMES A DAY AS NEEDED (Patient taking differently: TAKE 1 TABLET BY MOUTH DAILY AS NEEDED  SPASMS)  . vitamin C (ASCORBIC ACID) 500 MG tablet Take 500 mg by mouth daily.   . nitroGLYCERIN (NITROLINGUAL) 0.4 MG/SPRAY spray Place 1 spray under the tongue every 5 (five) minutes x 3 doses as needed for chest pain.  . rivaroxaban (XARELTO) 20 MG TABS tablet TAKE 1 TABLET BY MOUTH EVERY DAY WITH SUPPER (Patient not taking: Reported on 11/06/2017)  . sucralfate (CARAFATE) 1 g tablet Take 1 tablet (1 g total) by mouth 4 (four) times daily -  with meals and at bedtime. (Patient not taking: Reported on 11/06/2017)    FAMILY HISTORY:  Her family history includes  Congestive Heart Failure in her mother; Heart disease in her father.  SOCIAL HISTORY: She  reports that she has never smoked. She has never used smokeless tobacco. She reports that she drinks alcohol. She reports that she does not use drugs.     VITAL SIGNS: BP (!) 77/41   Pulse (!) 56   Temp (!) 97 F (36.1 C) (Temporal)   Resp 16   SpO2 99%         INTAKE / OUTPUT: No intake/output data recorded.  PHYSICAL EXAMINATION: General: The patient looks about stated age.She is awke and in no distress Neuro:  Alert and oriented x 3 HEENT: [place]/AT, sclerae aniteric, PErrla Cardiovascular:Irre, Irreg Lungs:  Clear to A and P Abdomen: soft, BS+, non-tender  Skin: warm and dry  LABS:  BMET Recent Labs  Lab 11/06/17 1117  NA 139  K 4.1  CL 107  CO2 24  BUN 19  CREATININE 1.14*  GLUCOSE 132*    Electrolytes Recent Labs  Lab 11/06/17 1117  CALCIUM 8.0*  MG 2.0    CBC Recent Labs  Lab 11/06/17 1117  WBC 5.0  HGB 7.3*  HCT 23.0*  PLT 191    Coag's No results for input(s): APTT, INR in the last 168 hours.  Sepsis Markers Recent Labs  Lab 11/06/17 1205  LATICACIDVEN 1.51    ABG No results for input(s): PHART, PCO2ART, PO2ART in the last 168 hours.  Liver Enzymes Recent Labs  Lab 11/06/17 1117  AST 19  ALT 15  ALKPHOS 35*  BILITOT 0.6  ALBUMIN 2.9*    Cardiac Enzymes No results for input(s): TROPONINI, PROBNP in the last 168 hours.  Glucose No results for input(s): GLUCAP in the last 168 hours.  Imaging Ct Head Wo Contrast  Result Date: 11/06/2017 CLINICAL DATA:  77 year old female with altered level of consciousness. CPR. EXAM: CT HEAD WITHOUT CONTRAST TECHNIQUE: Contiguous axial images were obtained from the base of the skull through the vertex without intravenous contrast. COMPARISON:  04/16/2014 CT and prior studies FINDINGS: Brain: No evidence of acute infarction, hemorrhage, hydrocephalus, extra-axial collection or mass lesion/mass  effect. Probable mild chronic small-vessel white matter ischemic changes again noted. Mild ventriculomegaly again identified. Vascular: Mild intracranial atherosclerotic calcifications identified. Skull: No acute abnormality. LEFT temporal/occipital craniectomy changes again noted. Sinuses/Orbits: Chronic changes of the LEFT maxillary and RIGHT sphenoid sinuses noted. No acute abnormality. Other: None IMPRESSION: 1. No evidence of acute intracranial abnormality. Probable mild chronic small-vessel white matter ischemic changes. Electronically Signed   By: Margarette Canada M.D.   On: 11/06/2017 13:19   Dg Chest Portable 1 View  Result Date: 11/06/2017 CLINICAL DATA:  Cardiac arrest. EXAM: PORTABLE CHEST 1 VIEW COMPARISON:  01/29/2017 prior radiographs FINDINGS: Cardiomediastinal silhouette is unremarkable and unchanged. There is no evidence of focal airspace disease, pulmonary edema, suspicious pulmonary nodule/mass, pleural effusion, or pneumothorax.  No acute bony abnormalities are identified. IMPRESSION: No active disease. Electronically Signed   By: Margarette Canada M.D.   On: 11/06/2017 12:11   Ct Angio Chest/abd/pel For Dissection W And/or Wo Contrast  Result Date: 11/06/2017 CLINICAL DATA:  Unresponsive and hypotensive. EXAM: CT ANGIOGRAPHY CHEST, ABDOMEN AND PELVIS TECHNIQUE: Multidetector CT imaging through the chest, abdomen and pelvis was performed using the standard protocol during bolus administration of intravenous contrast. Multiplanar reconstructed images and MIPs were obtained and reviewed to evaluate the vascular anatomy. CONTRAST:  171m ISOVUE-370 IOPAMIDOL (ISOVUE-370) INJECTION 76% COMPARISON:  Prior CT of the abdomen and pelvis with contrast on 10/28/2017 FINDINGS: CTA CHEST FINDINGS Cardiovascular: Ascending thoracic aorta shows mild dilatation measuring 4.1-4.2 cm in greatest diameter. The aortic root, arch and descending thoracic aorta are of normal caliber. There is mild scattered calcified  plaque. No dissection. Proximal great vessels are tortuous but shows no evidence of significant obstructive disease. Central pulmonary arteries are normal in caliber. No evidence of pulmonary embolism with satisfactory opacification of the pulmonary arteries. The heart size is at the upper limits of normal. No pericardial fluid. No significant calcified plaque in the coronary arteries with a single tiny calcified plaque present at the origin of a diagonal branch off of the LAD. Mediastinum/Nodes: No enlarged mediastinal, hilar, or axillary lymph nodes. Thyroid gland, trachea, and esophagus demonstrate no significant findings. Lungs/Pleura: Mild bibasilar atelectasis/scarring. There is no evidence of pulmonary edema, consolidation, pneumothorax, nodule or pleural fluid. Musculoskeletal: No chest wall abnormality. No acute or significant osseous findings. Review of the MIP images confirms the above findings. CTA ABDOMEN AND PELVIS FINDINGS VASCULAR Aorta: The abdominal aorta is normal in caliber and shows no evidence of aneurysm or dissection. Celiac: Normally patent.  Normal distal branching anatomy. SMA: Normally patent. Renals: Two separate renal arteries on the right and two separate renal arteries on the left. No evidence of renal artery stenosis. There is some irregularity in contour of the mid to distal aspect of the main upper right renal artery consistent with fibromuscular dysplasia. This vessel also demonstrates a small focal aneurysm at a branch point near the superior renal hilum measuring approximately 9 x 10 x 10 mm and demonstrating rim of internal mural thrombus. IMA: Normally patent. Inflow: Bilateral iliac and common femoral arteries show normal patency. No aneurysm or significant obstructive disease. Review of the MIP images confirms the above findings. NON-VASCULAR Hepatobiliary: No focal liver abnormality is seen. No gallstones, gallbladder wall thickening, or biliary dilatation. Pancreas:  Unremarkable. No pancreatic ductal dilatation or surrounding inflammatory changes. Spleen: Normal in size without focal abnormality. Adrenals/Urinary Tract: Adrenal glands are unremarkable. Kidneys are normal, without renal calculi, focal lesion, or hydronephrosis. Bladder is unremarkable. Stomach/Bowel: Bowel shows no evidence of obstruction. No obvious inflammatory process. Moderate fecal material throughout the colon. Mild amount of dependent higher density material is present in the stomach confirmed by the unenhanced portion of the study. The stomach is not distended. No free air. Lymphatic: No enlarged lymph nodes identified in the abdomen or pelvis. Reproductive: Status post hysterectomy. No adnexal masses. Other: No abdominal wall hernia or abnormality. No abscess or ascites. Musculoskeletal: Degenerative disc disease present of the lumbar spine. Review of the MIP images confirms the above findings. IMPRESSION: 1. No acute aortic pathology identified. No evidence of aortic dissection. 2. No evidence of pulmonary embolism. 3. Mild dilatation of the ascending thoracic aorta measuring 4.1-4.2 cm in greatest diameter. Recommend annual imaging followup by CTA or MRA. This recommendation follows 2010  ACCF/AHA/AATS/ACR/ASA/SCA/SCAI/SIR/STS/SVM Guidelines for the Diagnosis and Management of Patients with Thoracic Aortic Disease. Circulation. 2010; 121: J282-S601 4. Evidence of fibromuscular dysplasia involving the dominant of 2 right renal arteries. This vessel also shows a focal small aneurysm in its distal segment at a bifurcation point measuring 10 mm in diameter. Electronically Signed   By: Aletta Edouard M.D.   On: 11/06/2017 13:41        DISCUSSION:  The patient presents with profound hypotension and unresponsiveness but responded very quickly to resuscitation. Unclear why she became so obtunded and hypotensive. Her HB is 2-3 points down form her discharge 1 week ago and she does have guaic positive  stools but no active melena or BRBPR presently. I don't believe the patient has a septic syndrome. She is not encepohalopathic presently. Her lactate was <1. Her WBC and differential are normal. Her CXR was negative. CT abdo/chest  Fairly remarkable.  ASSESSMENT / PLAN:  PULMONARY Respiratory status appears stable. The patient does not appear to b e breathless. Her 02 sats are 100% on 2l 02. Her CXR is normal  CARDIOVASCULAR  Patient is in a sinus brady with HR of 50-60 BP remains a little low,. The patient as noted does not appear to be actively bleeding presently. Her ECG shows a sinus brady with PVCs. ECHO 1 year ago showed a LVEF of >55% with mild LVH,. Cardiac cath 1 year ago did not appear to show any severe obstructive coronary diseases.  RENAL Normal renal fn.  GASTROINTESTINAL +FOBT. The patient will need a colonoscscopy given recent negativ e EGD. Hb went from 9.8 to 7.3 in the past 8 days.     INFECTIOUS  No obvious source of oinfx. The patient wass cultured ND STARTED ON EMPIRIC ABX THERAPY which I believe is reasoanble unfder the circumstances    NEUROLOGIC  the patient appears a little vague for recent history. Her speech is normal and apprpriate. Moves all her extremities.  Endocrine  We are checking serunm coiortisol level given hypotension.       Micheal Likens MD Pulmonary and [place] Pager: [phone] [place] [phone]  11/06/2017, 2:11 PM

## 2017-11-06 NOTE — ED Notes (Signed)
Pt returns from ct. At CT pt became hypotensive again and levophen increased to 22mcg/min. retuyrns from ct and pt denies. Pain/. Husband at bedside and updated.

## 2017-11-06 NOTE — Progress Notes (Signed)
ANTIBIOTIC CONSULT NOTE - INITIAL  Pharmacy Consult for Vanco/Zosyn Indication: sepsis  No Known Allergies  Patient Measurements:   Adjusted Body Weight:    Vital Signs: Temp: 97 F (36.1 C) (07/27 1350) Temp Source: Temporal (07/27 1350) BP: 77/41 (07/27 1406) Pulse Rate: 56 (07/27 1406) Intake/Output from previous day: No intake/output data recorded. Intake/Output from this shift: No intake/output data recorded.  Labs: Recent Labs    11/06/17 1117  WBC 5.0  HGB 7.3*  PLT 191  CREATININE 1.14*   Estimated Creatinine Clearance: 41.6 mL/min (A) (by C-G formula based on SCr of 1.14 mg/dL (H)). No results for input(s): VANCOTROUGH, VANCOPEAK, VANCORANDOM, GENTTROUGH, GENTPEAK, GENTRANDOM, TOBRATROUGH, TOBRAPEAK, TOBRARND, AMIKACINPEAK, AMIKACINTROU, AMIKACIN in the last 72 hours.   Microbiology:   Medical History: Past Medical History:  Diagnosis Date  . A-fib ([place])   . Cancer ([place])    Melanoma  . Hearing loss   . Hypertension    Assessment: CC/HPI: CP with 3 min CPR (PEA). - Temp 96.5, HR 59, hypotension, LA 1.51  PMH: Afib, HTN, dHF, HLD, OSA,   Significant events: Recent discharge for GIB 7/17-7/19 (was taking Aleve). No active site of bleeding on EGD. Xarelto placed on hold.   Goal of Therapy:  Vancomycin trough level 15-20 mcg/ml  Plan:  Zosyn 3.375g IV x 1 then 3.375g IV q 8 hrs. Vanco 1g IV x 1 then 500mg  IV q h12 hrs. Vanco trough after 3-5 doses at steady state.   Atleigh Gruen S. Alford Highland, PharmD, BCPS Clinical Staff Pharmacist Pager [phone]  Eilene Ghazi Stillinger 11/06/2017,2:11 PM

## 2017-11-06 NOTE — ED Notes (Signed)
Dr. Mikki Santee at bedside speaking with pt and family.

## 2017-11-06 NOTE — ED Notes (Signed)
Attempted to call report

## 2017-11-07 LAB — BASIC METABOLIC PANEL
ANION GAP: 8 (ref 5–15)
BUN: 10 mg/dL (ref 8–23)
CALCIUM: 8.3 mg/dL — AB (ref 8.9–10.3)
CO2: 22 mmol/L (ref 22–32)
Chloride: 111 mmol/L (ref 98–111)
Creatinine, Ser: 0.84 mg/dL (ref 0.44–1.00)
GFR calc non Af Amer: >60 mL/min (ref >60)
Glucose, Bld: 86 mg/dL (ref 70–99)
Potassium: 3.7 mmol/L (ref 3.5–5.1)
Sodium: 141 mmol/L (ref 135–145)

## 2017-11-07 LAB — OCCULT BLOOD X 1 CARD TO LAB, STOOL: Fecal Occult Bld: POSITIVE — AB

## 2017-11-07 LAB — CBC
HEMATOCRIT: 26.6 % — AB (ref 36.0–46.0)
Hemoglobin: 8.6 g/dL — ABNORMAL LOW (ref 12.0–15.0)
MCH: 31.2 pg (ref 26.0–34.0)
MCHC: 32.3 g/dL (ref 30.0–36.0)
MCV: 96.4 fL (ref 78.0–100.0)
Platelets: 205 10*3/uL (ref 150–400)
RBC: 2.76 MIL/uL — AB (ref 3.87–5.11)
RDW: 13.7 % (ref 11.5–15.5)
WBC: 7.1 10*3/uL (ref 4.0–10.5)

## 2017-11-07 LAB — MAGNESIUM: Magnesium: 2 mg/dL (ref 1.7–2.4)

## 2017-11-07 LAB — PHOSPHORUS: PHOSPHORUS: 2.6 mg/dL (ref 2.5–4.6)

## 2017-11-07 MED ORDER — ACETAMINOPHEN 325 MG PO TABS
650.0000 mg | ORAL_TABLET | Freq: Four times a day (QID) | ORAL | Status: DC | PRN
  Administered 2017-11-08 – 2017-11-09 (×2): 650 mg via ORAL
  Filled 2017-11-07 (×2): qty 2

## 2017-11-07 MED ORDER — LOSARTAN POTASSIUM 50 MG PO TABS
100.0000 mg | ORAL_TABLET | Freq: Every day | ORAL | Status: DC
  Administered 2017-11-07 – 2017-11-10 (×4): 100 mg via ORAL
  Filled 2017-11-07 (×5): qty 2

## 2017-11-07 MED ORDER — THYROID 60 MG PO TABS
60.0000 mg | ORAL_TABLET | Freq: Every day | ORAL | Status: DC
  Administered 2017-11-07 – 2017-11-11 (×5): 60 mg via ORAL
  Filled 2017-11-07 (×6): qty 1

## 2017-11-07 MED FILL — Medication: Qty: 1 | Status: AC

## 2017-11-07 NOTE — Progress Notes (Signed)
eLink Physician-Brief Progress Note Patient Name: Jenisse Vullo DOB: 1941-04-03 MRN: [id number]   Date of Service  11/07/2017  HPI/Events of Note  Patient c/o generalized pain. - AST and ALT both normal.   eICU Interventions  Will order: 1. Tylenol 650 mg PO Q 6 hours PRN pain.     Intervention Category Intermediate Interventions: Pain - evaluation and management  Sommer,Steven Eugene 11/07/2017, 9:12 PM

## 2017-11-07 NOTE — Consult Note (Signed)
[facility] CONSULT Reason for consult: Anemia Referring Physician: Dr. Debbora Dus.  Primary GI: Dr. Phillips Climes Cynthia [name] is an 77 y.o. female.  HPI: The patient had an endoscopy and colonoscopy by Dr. Watt Climes about 5 years ago it was noted to be normal.  She was recently hospitalized.  She had been taking Aleve and Xarelto had a question of darker stool.  She was taking Xarelto for A. fib.  Her hemoglobin was around 9 and she was started on Protonix and was seen in consultation by Dr. Watt Climes.  EGD was performed and was completely negative.  Patient's hemoglobin was stable she did not require transfusion her Xarelto was put on hold to be started as an outpatient.  She had a urinary tract infection was started on Rocephin for a couple of doses.  She did quite well and was discharged with plans to have an outpatient colonoscopy by Dr. Watt Climes in the next week or so. Patient states that she was feeling dizzy and was found on the floor by her husband.  EMS was called and she was noted to have PEA and CPR was started.  She was unconscious but upon arrival to the ED was responsive.  She initially was bradycardic but received atropine.  She had been feeling lightheaded etc.  She was hypotensive and was initially treated with pressors.  Apparently MI was ruled out.  She now is feeling much better is off of pressors and tolerating clear liquids.  Her hemoglobinWas 7.3 without any signs of gross bleeding.  It is been 9.8 when she left the hospital.  She received 1 unit of PRBCs and is feeling much better.  She has had no bloody stools or no black stools and has been off of Xarelto since her discharge.  Were asked to see her back.  Past Medical History:  Diagnosis Date  . A-fib ([place])   . Cancer ([place])    Melanoma  . Hearing loss   . Hypertension     Past Surgical History:  Procedure Laterality Date  . APPENDECTOMY    . CRANIECTOMY FOR EXCISION OF ACOUSTIC NEUROMA    . ESOPHAGOGASTRODUODENOSCOPY (EGD)  WITH PROPOFOL N/A 10/28/2017   Procedure: ESOPHAGOGASTRODUODENOSCOPY (EGD) WITH PROPOFOL;  Surgeon: Clarene Essex, MD;  Location: [facility];  Service: Endoscopy;  Laterality: N/A;  . LEFT HEART CATH AND CORONARY ANGIOGRAPHY N/A 11/24/2016   Procedure: LEFT HEART CATH AND CORONARY ANGIOGRAPHY;  Surgeon: Jettie Booze, MD;  Location: [facility];  Service: Cardiovascular;  Laterality: N/A;  . PARTIAL HYSTERECTOMY    . TONSILLECTOMY      Family History  Problem Relation Age of Onset  . Congestive Heart Failure Mother   . Heart disease Father        History of heart attacks at a later age.      Social History:  reports that she has never smoked. She has never used smokeless tobacco. She reports that she drinks alcohol. She reports that she does not use drugs.  Allergies: No Known Allergies  Medications; Prior to Admission medications   Medication Sig Start Date End Date Taking? Authorizing Provider  Alpha-Lipoic Acid (LIPOIC ACID PO) Take 600 mg by mouth 2 (two) times daily.    Yes [provider]  atorvastatin (LIPITOR) 10 MG tablet TAKE 1 TABLET BY MOUTH EVERY DAY AT 6 PM 12/16/16  Yes Crenshaw, Denice Bors, MD  CALCIUM PO Take 600 mg by mouth 2 (two) times daily.    Yes [provider]  cetirizine (ZYRTEC) 10 MG tablet Take 10 mg by mouth daily.   Yes [provider]  cholecalciferol (VITAMIN D) 1000 UNITS tablet Take 1,000 Units by mouth daily.   Yes [provider]  CRANBERRY CONCENTRATE PO Take 650 mg by mouth daily.    Yes [provider]  cycloSPORINE (RESTASIS) 0.05 % ophthalmic emulsion Place 1 drop into both eyes 2 (two) times daily.   Yes [provider]  diltiazem (CARDIZEM CD) 120 MG 24 hr capsule Take 1 capsule (120 mg total) by mouth daily. 12/16/16  Yes Lelon Perla, MD  DULoxetine (CYMBALTA) 30 MG capsule TAKE 1 CAPSULE (30 MG TOTAL) BY MOUTH 2 (TWO) TIMES DAILY. 09/09/17  Yes Sater, Nanine Means, MD   GLUCOSAMINE-CHONDROITIN PO Take 2 tablets by mouth daily with lunch. Glucosamine 1524m Chondroitin 12052m  Yes [provider]  losartan (COZAAR) 100 MG tablet Take 1 tablet (100 mg total) by mouth daily. 03/09/17  Yes CrLelon PerlaMD  Lutein-Zeaxanthin 25-5 MG CAPS Take 1 tablet by mouth daily.   Yes [provider]  Melatonin 5 MG TABS Take 5 mg by mouth at bedtime.   Yes [provider]  Meth-Hyo-M Bl-Na Phos-Ph Sal (URIBEL PO) Take 36 mg by mouth daily.   Yes [provider]  Multiple Vitamin (MULTIVITAMIN WITH MINERALS) TABS tablet Take 1 tablet by mouth daily.   Yes [provider]  Omega-3 1000 MG CAPS Take 1,000 mg by mouth daily.   Yes [provider]  pantoprazole (PROTONIX) 40 MG tablet Take 40 mg by mouth daily before breakfast.  06/12/14  Yes [provider]  ranitidine (ZANTAC) 150 MG tablet Take 1 tablet (150 mg total) by mouth 2 (two) times daily. 12/22/16 11/06/17 Yes IsDuffy BruceMD  spironolactone (ALDACTONE) 25 MG tablet TAKE 1 TABLET BY MOUTH EVERY DAY IN THE MORNING 07/21/17  Yes [provider]  thyroid (ARMOUR) 60 MG tablet Take 60 mg by mouth daily before breakfast. 11/07/17  Yes [provider]  tiZANidine (ZANAFLEX) 4 MG tablet TAKE 1 TABLET BY MOUTH THREE TIMES A DAY AS NEEDED Patient taking differently: TAKE 1 TABLET BY MOUTH DAILY AS NEEDED SPASMS 08/04/17  Yes Sater, RiNanine MeansMD  vitamin C (ASCORBIC ACID) 500 MG tablet Take 500 mg by mouth daily.    Yes [provider]  nitroGLYCERIN (NITROLINGUAL) 0.4 MG/SPRAY spray Place 1 spray under the tongue every 5 (five) minutes x 3 doses as needed for chest pain.    [provider]  rivaroxaban (XARELTO) 20 MG TABS tablet TAKE 1 TABLET BY MOUTH EVERY DAY WITH SUPPER Patient not taking: Reported on 11/06/2017 11/01/17   ShDeatra MD  sucralfate (CARAFATE) 1 g tablet Take 1 tablet (1 g total) by mouth 4 (four)  times daily -  with meals and at bedtime. Patient not taking: Reported on 11/06/2017 12/22/16   IsDuffy BruceMD   . thyroid  60 mg Oral QAC breakfast   PRN Meds Place/Maintain arterial line **AND** sodium chloride, sodium chloride, ondansetron (ZOFRAN) IV Results for orders placed or performed during the hospital encounter of 11/06/17 (from the past 48 hour(s))  Magnesium     Status: None   Collection Time: 11/06/17 11:17 AM  Result Value Ref Range   Magnesium 2.0 1.7 - 2.4 mg/dL    Comment: Performed at [facility] [place] [id number]     Status: None  Collection Time: 11/06/17 11:17 AM  Result Value Ref Range   TSH 1.341 0.350 - 4.500 uIU/mL    Comment: Performed by a 3rd Generation assay with a functional sensitivity of <=0.01 uIU/mL. Performed at [facility], [place] [address] [place], [place] [address]   CBC with Differential     Status: Abnormal   Collection Time: 11/06/17 11:17 AM  Result Value Ref Range   WBC 5.0 4.0 - 10.5 K/uL   RBC 2.32 (L) 3.87 - 5.11 MIL/uL   Hemoglobin 7.3 (L) 12.0 - 15.0 g/dL   HCT 23.0 (L) 36.0 - 46.0 %   MCV 99.1 78.0 - 100.0 fL   MCH 31.5 26.0 - 34.0 pg   MCHC 31.7 30.0 - 36.0 g/dL   RDW 13.1 11.5 - 15.5 %   Platelets 191 150 - 400 K/uL   Neutrophils Relative % 58 %   Neutro Abs 2.9 1.7 - 7.7 K/uL   Lymphocytes Relative 28 %   Lymphs Abs 1.4 0.7 - 4.0 K/uL   Monocytes Relative 11 %   Monocytes Absolute 0.5 0.1 - 1.0 K/uL   Eosinophils Relative 2 %   Eosinophils Absolute 0.1 0.0 - 0.7 K/uL   Basophils Relative 1 %   Basophils Absolute 0.0 0.0 - 0.1 K/uL   Immature Granulocytes 0 %   Abs Immature Granulocytes 0.0 0.0 - 0.1 K/uL    Comment: Performed at [facility], [address] [place], [place] [address]  Comprehensive metabolic panel     Status: Abnormal   Collection Time: 11/06/17 11:17 AM  Result Value Ref Range   Sodium 139 135 - 145 mmol/L   Potassium 4.1 3.5 - 5.1 mmol/L    Chloride 107 98 - 111 mmol/L   CO2 24 22 - 32 mmol/L   Glucose, Bld 132 (H) 70 - 99 mg/dL   BUN 19 8 - 23 mg/dL   Creatinine, Ser 1.14 (H) 0.44 - 1.00 mg/dL   Calcium 8.0 (L) 8.9 - 10.3 mg/dL   Total Protein 4.9 (L) 6.5 - 8.1 g/dL   Albumin 2.9 (L) 3.5 - 5.0 g/dL   AST 19 15 - 41 U/L   ALT 15 0 - 44 U/L   Alkaline Phosphatase 35 (L) 38 - 126 U/L   Total Bilirubin 0.6 0.3 - 1.2 mg/dL   GFR calc non Af Amer 46 (L) >60 mL/min   GFR calc Af Amer 53 (L) >60 mL/min    Comment: (NOTE) The eGFR has been calculated using the CKD EPI equation. This calculation has not been validated in all clinical situations. eGFR's persistently <60 mL/min signify possible Chronic Kidney Disease.    Anion gap 8 5 - 15    Comment: Performed at [facility] [address] [place], [place] [address]  Cortisol     Status: None   Collection Time: 11/06/17 11:17 AM  Result Value Ref Range   Cortisol, Plasma 17.5 ug/dL    Comment: (NOTE) AM    6.7 - 22.6 ug/dL PM   <10.0       ug/dL Performed at [facility] [address] [place], [place] [address]   Procalcitonin     Status: None   Collection Time: 11/06/17 11:17 AM  Result Value Ref Range   Procalcitonin <0.10 ng/mL    Comment:        Interpretation: PCT (Procalcitonin) <= 0.5 ng/mL: Systemic infection (sepsis) is not likely. Local bacterial infection is possible. (NOTE)  Sepsis PCT Algorithm           Lower Respiratory Tract                                      Infection PCT Algorithm    ----------------------------     ----------------------------         PCT < 0.25 ng/mL                PCT < 0.10 ng/mL         Strongly encourage             Strongly discourage   discontinuation of antibiotics    initiation of antibiotics    ----------------------------     -----------------------------       PCT 0.25 - 0.50 ng/mL            PCT 0.10 - 0.25 ng/mL               OR       >80% decrease in PCT            Discourage initiation  of                                            antibiotics      Encourage discontinuation           of antibiotics    ----------------------------     -----------------------------         PCT >= 0.50 ng/mL              PCT 0.26 - 0.50 ng/mL               AND        <80% decrease in PCT             Encourage initiation of                                             antibiotics       Encourage continuation           of antibiotics    ----------------------------     -----------------------------        PCT >= 0.50 ng/mL                  PCT > 0.50 ng/mL               AND         increase in PCT                  Strongly encourage                                      initiation of antibiotics    Strongly encourage escalation           of antibiotics                                     -----------------------------  PCT <= 0.25 ng/mL                                                 OR                                        > 80% decrease in PCT                                     Discontinue / Do not initiate                                             antibiotics Performed at [facility], [place] [address] [place], [place] [address]   ABO/Rh     Status: None   Collection Time: 11/06/17 11:22 AM  Result Value Ref Range   ABO/RH(D)      A POS Performed at [facility] [address], [place], [place] [address]   I-stat troponin, ED     Status: None   Collection Time: 11/06/17 12:03 PM  Result Value Ref Range   Troponin i, poc 0.00 0.00 - 0.08 ng/mL   Comment 3            Comment: Due to the release kinetics of cTnI, a negative result within the first hours of the onset of symptoms does not rule out myocardial infarction with certainty. If myocardial infarction is still suspected, repeat the test at appropriate intervals.   I-Stat CG4 Lactic Acid, ED     Status: None   Collection Time: 11/06/17 12:05 PM  Result Value Ref  Range   Lactic Acid, Venous 1.51 0.5 - 1.9 mmol/L  Prepare RBC     Status: None   Collection Time: 11/06/17  1:04 PM  Result Value Ref Range   Order Confirmation      ORDER PROCESSED BY BLOOD BANK Performed at [facility], [place] [address] [place], [place] [address]   Type and screen [place]     Status: None (Preliminary result)   Collection Time: 11/06/17  1:07 PM  Result Value Ref Range   ABO/RH(D) A POS    Antibody Screen NEG    Sample Expiration 11/09/2017    Unit Number [id number]    Blood Component Type RED CELLS,LR    Unit division 00    Status of Unit ISSUED    Unit tag comment VERBAL ORDERS PER DR ZAVITZ    Transfusion Status OK TO TRANSFUSE    Crossmatch Result COMPATIBLE    Unit Number [id number]    Blood Component Type RED CELLS,LR    Unit division 00    Status of Unit ALLOCATED    Transfusion Status OK TO TRANSFUSE    Crossmatch Result Compatible   Urinalysis, Routine w reflex microscopic     Status: Abnormal   Collection Time: 11/06/17  1:30 PM  Result Value Ref Range   Color, Urine YELLOW YELLOW   APPearance CLOUDY (A) CLEAR   Specific Gravity, Urine 1.016 1.005 - 1.030  pH 7.0 5.0 - 8.0   Glucose, UA NEGATIVE NEGATIVE mg/dL   Hgb urine dipstick MODERATE (A) NEGATIVE   Bilirubin Urine NEGATIVE NEGATIVE   Ketones, ur NEGATIVE NEGATIVE mg/dL   Protein, ur NEGATIVE NEGATIVE mg/dL   Nitrite NEGATIVE NEGATIVE   Leukocytes, UA SMALL (A) NEGATIVE   RBC / HPF 21-50 0 - 5 RBC/hpf   WBC, UA 21-50 0 - 5 WBC/hpf   Bacteria, UA FEW (A) NONE SEEN   Squamous Epithelial / LPF 11-20 0 - 5   Trans Epithel, UA 4    Mucus PRESENT     Comment: Performed at [facility], [place] [address], [place], [place] [address]  POC occult blood, ED     Status: Abnormal   Collection Time: 11/06/17  1:42 PM  Result Value Ref Range   Fecal Occult Bld POSITIVE (A) NEGATIVE  MRSA PCR Screening     Status: None   Collection Time: 11/06/17  3:46 PM   Result Value Ref Range   MRSA by PCR NEGATIVE NEGATIVE    Comment:        The GeneXpert MRSA Assay (FDA approved for NASAL specimens only), is one component of a comprehensive MRSA colonization surveillance program. It is not intended to diagnose MRSA infection nor to guide or monitor treatment for MRSA infections. Performed at [facility], [place] [address] [place], [place] [address]   CBC     Status: Abnormal   Collection Time: 11/06/17  6:07 PM  Result Value Ref Range   WBC 6.6 4.0 - 10.5 K/uL   RBC 2.87 (L) 3.87 - 5.11 MIL/uL   Hemoglobin 8.9 (L) 12.0 - 15.0 g/dL   HCT 28.0 (L) 36.0 - 46.0 %   MCV 97.6 78.0 - 100.0 fL   MCH 31.0 26.0 - 34.0 pg   MCHC 31.8 30.0 - 36.0 g/dL   RDW 13.2 11.5 - 15.5 %   Platelets 223 150 - 400 K/uL    Comment: Performed at [facility], [place] [address], [place], [place] [address]  Protime-INR     Status: None   Collection Time: 11/06/17  6:07 PM  Result Value Ref Range   Prothrombin Time 14.7 11.4 - 15.2 seconds   INR 1.16     Comment: Performed at [facility] [address], [place], [place] [address]  APTT     Status: None   Collection Time: 11/06/17  6:07 PM  Result Value Ref Range   aPTT 32 24 - 36 seconds    Comment: Performed at [facility] [address], [place], [place] [address]  CBC     Status: Abnormal   Collection Time: 11/07/17  4:23 AM  Result Value Ref Range   WBC 7.1 4.0 - 10.5 K/uL   RBC 2.76 (L) 3.87 - 5.11 MIL/uL   Hemoglobin 8.6 (L) 12.0 - 15.0 g/dL   HCT 26.6 (L) 36.0 - 46.0 %   MCV 96.4 78.0 - 100.0 fL   MCH 31.2 26.0 - 34.0 pg   MCHC 32.3 30.0 - 36.0 g/dL   RDW 13.7 11.5 - 15.5 %   Platelets 205 150 - 400 K/uL    Comment: Performed at [facility], [place] [address], [place], [place] [address]  Basic metabolic panel     Status: Abnormal   Collection Time: 11/07/17  4:23 AM  Result Value Ref Range   Sodium 141 135 - 145 mmol/L  Potassium 3.7 3.5 - 5.1 mmol/L   Chloride 111  98 - 111 mmol/L   CO2 22 22 - 32 mmol/L   Glucose, Bld 86 70 - 99 mg/dL   BUN 10 8 - 23 mg/dL   Creatinine, Ser 0.84 0.44 - 1.00 mg/dL   Calcium 8.3 (L) 8.9 - 10.3 mg/dL   GFR calc non Af Amer >60 >60 mL/min   GFR calc Af Amer >60 >60 mL/min    Comment: (NOTE) The eGFR has been calculated using the CKD EPI equation. This calculation has not been validated in all clinical situations. eGFR's persistently <60 mL/min signify possible Chronic Kidney Disease.    Anion gap 8 5 - 15    Comment: Performed at [facility] [address], [place], [place] [address]  Magnesium     Status: None   Collection Time: 11/07/17  4:23 AM  Result Value Ref Range   Magnesium 2.0 1.7 - 2.4 mg/dL    Comment: Performed at [facility] [address] [place], [place] [address]  Phosphorus     Status: None   Collection Time: 11/07/17  4:23 AM  Result Value Ref Range   Phosphorus 2.6 2.5 - 4.6 mg/dL    Comment: Performed at [facility] [address], [place], [place] [address]    Ct Head Wo Contrast  Result Date: 11/06/2017 CLINICAL DATA:  77 year old female with altered level of consciousness. CPR. EXAM: CT HEAD WITHOUT CONTRAST TECHNIQUE: Contiguous axial images were obtained from the base of the skull through the vertex without intravenous contrast. COMPARISON:  04/16/2014 CT and prior studies FINDINGS: Brain: No evidence of acute infarction, hemorrhage, hydrocephalus, extra-axial collection or mass lesion/mass effect. Probable mild chronic small-vessel white matter ischemic changes again noted. Mild ventriculomegaly again identified. Vascular: Mild intracranial atherosclerotic calcifications identified. Skull: No acute abnormality. LEFT temporal/occipital craniectomy changes again noted. Sinuses/Orbits: Chronic changes of the LEFT maxillary and RIGHT sphenoid sinuses noted. No acute abnormality. Other: None IMPRESSION: 1. No evidence of acute intracranial abnormality. Probable mild  chronic small-vessel white matter ischemic changes. Electronically Signed   By: Margarette Canada M.D.   On: 11/06/2017 13:19   Dg Chest Portable 1 View  Result Date: 11/06/2017 CLINICAL DATA:  Cardiac arrest. EXAM: PORTABLE CHEST 1 VIEW COMPARISON:  01/29/2017 prior radiographs FINDINGS: Cardiomediastinal silhouette is unremarkable and unchanged. There is no evidence of focal airspace disease, pulmonary edema, suspicious pulmonary nodule/mass, pleural effusion, or pneumothorax. No acute bony abnormalities are identified. IMPRESSION: No active disease. Electronically Signed   By: Margarette Canada M.D.   On: 11/06/2017 12:11   Ct Angio Chest/abd/pel For Dissection W And/or Wo Contrast  Result Date: 11/06/2017 CLINICAL DATA:  Unresponsive and hypotensive. EXAM: CT ANGIOGRAPHY CHEST, ABDOMEN AND PELVIS TECHNIQUE: Multidetector CT imaging through the chest, abdomen and pelvis was performed using the standard protocol during bolus administration of intravenous contrast. Multiplanar reconstructed images and MIPs were obtained and reviewed to evaluate the vascular anatomy. CONTRAST:  182m ISOVUE-370 IOPAMIDOL (ISOVUE-370) INJECTION 76% COMPARISON:  Prior CT of the abdomen and pelvis with contrast on 10/28/2017 FINDINGS: CTA CHEST FINDINGS Cardiovascular: Ascending thoracic aorta shows mild dilatation measuring 4.1-4.2 cm in greatest diameter. The aortic root, arch and descending thoracic aorta are of normal caliber. There is mild scattered calcified plaque. No dissection. Proximal great vessels are tortuous but shows no evidence of significant obstructive disease. Central pulmonary arteries are normal in caliber. No evidence of pulmonary embolism with satisfactory opacification of the  pulmonary arteries. The heart size is at the upper limits of normal. No pericardial fluid. No significant calcified plaque in the coronary arteries with a single tiny calcified plaque present at the origin of a diagonal branch off of the LAD.  Mediastinum/Nodes: No enlarged mediastinal, hilar, or axillary lymph nodes. Thyroid gland, trachea, and esophagus demonstrate no significant findings. Lungs/Pleura: Mild bibasilar atelectasis/scarring. There is no evidence of pulmonary edema, consolidation, pneumothorax, nodule or pleural fluid. Musculoskeletal: No chest wall abnormality. No acute or significant osseous findings. Review of the MIP images confirms the above findings. CTA ABDOMEN AND PELVIS FINDINGS VASCULAR Aorta: The abdominal aorta is normal in caliber and shows no evidence of aneurysm or dissection. Celiac: Normally patent.  Normal distal branching anatomy. SMA: Normally patent. Renals: Two separate renal arteries on the right and two separate renal arteries on the left. No evidence of renal artery stenosis. There is some irregularity in contour of the mid to distal aspect of the main upper right renal artery consistent with fibromuscular dysplasia. This vessel also demonstrates a small focal aneurysm at a branch point near the superior renal hilum measuring approximately 9 x 10 x 10 mm and demonstrating rim of internal mural thrombus. IMA: Normally patent. Inflow: Bilateral iliac and common femoral arteries show normal patency. No aneurysm or significant obstructive disease. Review of the MIP images confirms the above findings. NON-VASCULAR Hepatobiliary: No focal liver abnormality is seen. No gallstones, gallbladder wall thickening, or biliary dilatation. Pancreas: Unremarkable. No pancreatic ductal dilatation or surrounding inflammatory changes. Spleen: Normal in size without focal abnormality. Adrenals/Urinary Tract: Adrenal glands are unremarkable. Kidneys are normal, without renal calculi, focal lesion, or hydronephrosis. Bladder is unremarkable. Stomach/Bowel: Bowel shows no evidence of obstruction. No obvious inflammatory process. Moderate fecal material throughout the colon. Mild amount of dependent higher density material is present in  the stomach confirmed by the unenhanced portion of the study. The stomach is not distended. No free air. Lymphatic: No enlarged lymph nodes identified in the abdomen or pelvis. Reproductive: Status post hysterectomy. No adnexal masses. Other: No abdominal wall hernia or abnormality. No abscess or ascites. Musculoskeletal: Degenerative disc disease present of the lumbar spine. Review of the MIP images confirms the above findings. IMPRESSION: 1. No acute aortic pathology identified. No evidence of aortic dissection. 2. No evidence of pulmonary embolism. 3. Mild dilatation of the ascending thoracic aorta measuring 4.1-4.2 cm in greatest diameter. Recommend annual imaging followup by CTA or MRA. This recommendation follows 2010 ACCF/AHA/AATS/ACR/ASA/SCA/SCAI/SIR/STS/SVM Guidelines for the Diagnosis and Management of Patients with Thoracic Aortic Disease. Circulation. 2010; 121: J093-O671 4. Evidence of fibromuscular dysplasia involving the dominant of 2 right renal arteries. This vessel also shows a focal small aneurysm in its distal segment at a bifurcation point measuring 10 mm in diameter. Electronically Signed   By: Aletta Edouard M.D.   On: 11/06/2017 13:41               Blood pressure (!) 148/68, pulse 74, temperature 98.5 F (36.9 C), temperature source Oral, resp. rate 13, weight 69.4 kg (153 lb), SpO2 96 %.  Physical exam:   General--Pleasant white female in no distress eating breakfast ENT--nonicteric  neck--supple Heart--regular rate and rhythm without murmurs or gallops  Lungs--clear Abdomen--soft and nontender Psych--alert and oriented no obvious mental impairment  Assessment: 1.  Acute hypotension and unresponsiveness.  This required CPR and resuscitation with unclear etiology.  Her hemoglobin was down a little from her previous admission but she was having no gross active bleeding.  She is on antibiotics but is not clear she has a sepsis syndrome.  Work-up is been negative for  pneumonia and pulmonary embolus.  There is no gross bleeding. 2.  History of GI bleeding while on Xarelto.  No more gross bleeding since her last admission.  EGD was negative and colonoscopy was -5 years ago.  There is no gross evidence of ongoing GI bleeding 3.  A. fib has previously been anticoagulated  Plan: At this point I am hesitant to go ahead with colonoscopy until the etiology of her cardiopulmonary collapse is explained and we can be certain this is not going to happen again.  We will follow with you here in the hospital.   Nancy Fetter 11/07/2017, 10:25 AM   This note was created using voice recognition software and minor errors may Have occurred unintentionally. Pager: [phone] If no answer or after hours call [phone]

## 2017-11-07 NOTE — Progress Notes (Signed)
PULMONARY / CRITICAL CARE MEDICINE   Name: Cynthia [name] MRN: [id number] DOB: 1940/12/08    ADMISSION DATE:  11/06/2017     CHIEF COMPLAINT: The patient presented from home pale and diaphoretic.  HISTORY OF PRESENT ILLNESS:   tHE patient has been feeling sleepy and weak the past few days. She acutall received about 2 minutes of CPR when her pulse could not be ascultated upon arrival. Her gheart rate after resuscitation was in the 40s  and she responded to 1 mg of Atropine. The patient had been hospitalized up to 1 week ago for a gi bleed. She underwent an UGI at that timewhich apparently did hn ot show the source of bleeding. The patient was to make an appt. As an poutpt folr a colonoscopy after discharge. She has a history of Atrial fib for which she usually takes Xarelto. the patient has not taken it since her last admission. She denieds bloody or black stools lately. The patient is currently on 2 mcg/min of levophed.  7/28 The patient is off pressors. Her bp is in the 825-003 systolic range. The patient is engaged, cogent.. Her color is good. Her heart rate is a NSR in the 70s.   PAST MEDICAL HISTORY :  She  has a past medical history of A-fib ([place]), Cancer ([place]), Hearing loss, and Hypertension.  PAST SURGICAL HISTORY: She  has a past surgical history that includes Partial hysterectomy; Tonsillectomy; Appendectomy; Craniectomy for excision of acoustic neuroma; LEFT HEART CATH AND CORONARY ANGIOGRAPHY (N/A, 11/24/2016); and Esophagogastroduodenoscopy (egd) with propofol (N/A, 10/28/2017).  No Known Allergies  No current facility-administered medications on file prior to encounter.    Current Outpatient Medications on File Prior to Encounter  Medication Sig  . Alpha-Lipoic Acid (LIPOIC ACID PO) Take 600 mg by mouth 2 (two) times daily.   Marland Kitchen atorvastatin (LIPITOR) 10 MG tablet TAKE 1 TABLET BY MOUTH EVERY DAY AT 6 PM  . CALCIUM PO Take 600 mg by mouth 2 (two) times daily.   .  cetirizine (ZYRTEC) 10 MG tablet Take 10 mg by mouth daily.  . cholecalciferol (VITAMIN D) 1000 UNITS tablet Take 1,000 Units by mouth daily.  Marland Kitchen CRANBERRY CONCENTRATE PO Take 650 mg by mouth daily.   . cycloSPORINE (RESTASIS) 0.05 % ophthalmic emulsion Place 1 drop into both eyes 2 (two) times daily.  Marland Kitchen diltiazem (CARDIZEM CD) 120 MG 24 hr capsule Take 1 capsule (120 mg total) by mouth daily.  . DULoxetine (CYMBALTA) 30 MG capsule TAKE 1 CAPSULE (30 MG TOTAL) BY MOUTH 2 (TWO) TIMES DAILY.  Marland Kitchen GLUCOSAMINE-CHONDROITIN PO Take 2 tablets by mouth daily with lunch. Glucosamine 1543m Chondroitin 12077m . losartan (COZAAR) 100 MG tablet Take 1 tablet (100 mg total) by mouth daily.  . Lutein-Zeaxanthin 25-5 MG CAPS Take 1 tablet by mouth daily.  . Melatonin 5 MG TABS Take 5 mg by mouth at bedtime.  . Meth-Hyo-M Bl-Na Phos-Ph Sal (URIBEL PO) Take 36 mg by mouth daily.  . Multiple Vitamin (MULTIVITAMIN WITH MINERALS) TABS tablet Take 1 tablet by mouth daily.  . Omega-3 1000 MG CAPS Take 1,000 mg by mouth daily.  . pantoprazole (PROTONIX) 40 MG tablet Take 40 mg by mouth daily before breakfast.   . ranitidine (ZANTAC) 150 MG tablet Take 1 tablet (150 mg total) by mouth 2 (two) times daily.  . Marland Kitchenpironolactone (ALDACTONE) 25 MG tablet TAKE 1 TABLET BY MOUTH EVERY DAY IN THE MORNING  . thyroid (ARMOUR) 60 MG tablet Take 60 mg  by mouth daily before breakfast.  . tiZANidine (ZANAFLEX) 4 MG tablet TAKE 1 TABLET BY MOUTH THREE TIMES A DAY AS NEEDED (Patient taking differently: TAKE 1 TABLET BY MOUTH DAILY AS NEEDED SPASMS)  . vitamin C (ASCORBIC ACID) 500 MG tablet Take 500 mg by mouth daily.   . nitroGLYCERIN (NITROLINGUAL) 0.4 MG/SPRAY spray Place 1 spray under the tongue every 5 (five) minutes x 3 doses as needed for chest pain.  . rivaroxaban (XARELTO) 20 MG TABS tablet TAKE 1 TABLET BY MOUTH EVERY DAY WITH SUPPER (Patient not taking: Reported on 11/06/2017)  . sucralfate (CARAFATE) 1 g tablet Take 1 tablet (1  g total) by mouth 4 (four) times daily -  with meals and at bedtime. (Patient not taking: Reported on 11/06/2017)    FAMILY HISTORY:  Her family history includes Congestive Heart Failure in her mother; Heart disease in her father.  SOCIAL HISTORY: She  reports that she has never smoked. She has never used smokeless tobacco. She reports that she drinks alcohol. She reports that she does not use drugs.     VITAL SIGNS: BP (!) 148/68 (BP Location: Right Arm)   Pulse 74   Temp 98.5 F (36.9 C) (Oral)   Resp 13   Wt 153 lb (69.4 kg)   SpO2 96%   BMI 25.46 kg/m         INTAKE / OUTPUT: I/O last 3 completed shifts: In: 3760.4 [I.V.:2526.3; Blood:315; IV Piggyback:919.1] Out: 8786 [Urine:1850]  PHYSICAL EXAMINATION: General: The patient looks about stated age.She is awke and in no distress Neuro:  Alert and oriented x 3 HEENT: [place]/AT, sclerae aniteric, PErrla Cardiovascular:Irre, Irreg Lungs:  Clear to A and P Abdomen: soft, BS+, non-tender  Skin: warm and dry  LABS:  BMET Recent Labs  Lab 11/06/17 1117 11/07/17 0423  NA 139 141  K 4.1 3.7  CL 107 111  CO2 24 22  BUN 19 10  CREATININE 1.14* 0.84  GLUCOSE 132* 86    Electrolytes Recent Labs  Lab 11/06/17 1117 11/07/17 0423  CALCIUM 8.0* 8.3*  MG 2.0 2.0  PHOS  --  2.6    CBC Recent Labs  Lab 11/06/17 1117 11/06/17 1807 11/07/17 0423  WBC 5.0 6.6 7.1  HGB 7.3* 8.9* 8.6*  HCT 23.0* 28.0* 26.6*  PLT 191 223 205    Coag's Recent Labs  Lab 11/06/17 1807  APTT 32  INR 1.16    Sepsis Markers Recent Labs  Lab 11/06/17 1117 11/06/17 1205  LATICACIDVEN  --  1.51  PROCALCITON <0.10  --     ABG No results for input(s): PHART, PCO2ART, PO2ART in the last 168 hours.  Liver Enzymes Recent Labs  Lab 11/06/17 1117  AST 19  ALT 15  ALKPHOS 35*  BILITOT 0.6  ALBUMIN 2.9*    Cardiac Enzymes No results for input(s): TROPONINI, PROBNP in the last 168 hours.  Glucose No results for input(s):  GLUCAP in the last 168 hours.  Imaging Ct Head Wo Contrast  Result Date: 11/06/2017 CLINICAL DATA:  77 year old female with altered level of consciousness. CPR. EXAM: CT HEAD WITHOUT CONTRAST TECHNIQUE: Contiguous axial images were obtained from the base of the skull through the vertex without intravenous contrast. COMPARISON:  04/16/2014 CT and prior studies FINDINGS: Brain: No evidence of acute infarction, hemorrhage, hydrocephalus, extra-axial collection or mass lesion/mass effect. Probable mild chronic small-vessel white matter ischemic changes again noted. Mild ventriculomegaly again identified. Vascular: Mild intracranial atherosclerotic calcifications identified. Skull: No acute abnormality.  LEFT temporal/occipital craniectomy changes again noted. Sinuses/Orbits: Chronic changes of the LEFT maxillary and RIGHT sphenoid sinuses noted. No acute abnormality. Other: None IMPRESSION: 1. No evidence of acute intracranial abnormality. Probable mild chronic small-vessel white matter ischemic changes. Electronically Signed   By: Margarette Canada M.D.   On: 11/06/2017 13:19   Dg Chest Portable 1 View  Result Date: 11/06/2017 CLINICAL DATA:  Cardiac arrest. EXAM: PORTABLE CHEST 1 VIEW COMPARISON:  01/29/2017 prior radiographs FINDINGS: Cardiomediastinal silhouette is unremarkable and unchanged. There is no evidence of focal airspace disease, pulmonary edema, suspicious pulmonary nodule/mass, pleural effusion, or pneumothorax. No acute bony abnormalities are identified. IMPRESSION: No active disease. Electronically Signed   By: Margarette Canada M.D.   On: 11/06/2017 12:11   Ct Angio Chest/abd/pel For Dissection W And/or Wo Contrast  Result Date: 11/06/2017 CLINICAL DATA:  Unresponsive and hypotensive. EXAM: CT ANGIOGRAPHY CHEST, ABDOMEN AND PELVIS TECHNIQUE: Multidetector CT imaging through the chest, abdomen and pelvis was performed using the standard protocol during bolus administration of intravenous contrast.  Multiplanar reconstructed images and MIPs were obtained and reviewed to evaluate the vascular anatomy. CONTRAST:  122m ISOVUE-370 IOPAMIDOL (ISOVUE-370) INJECTION 76% COMPARISON:  Prior CT of the abdomen and pelvis with contrast on 10/28/2017 FINDINGS: CTA CHEST FINDINGS Cardiovascular: Ascending thoracic aorta shows mild dilatation measuring 4.1-4.2 cm in greatest diameter. The aortic root, arch and descending thoracic aorta are of normal caliber. There is mild scattered calcified plaque. No dissection. Proximal great vessels are tortuous but shows no evidence of significant obstructive disease. Central pulmonary arteries are normal in caliber. No evidence of pulmonary embolism with satisfactory opacification of the pulmonary arteries. The heart size is at the upper limits of normal. No pericardial fluid. No significant calcified plaque in the coronary arteries with a single tiny calcified plaque present at the origin of a diagonal branch off of the LAD. Mediastinum/Nodes: No enlarged mediastinal, hilar, or axillary lymph nodes. Thyroid gland, trachea, and esophagus demonstrate no significant findings. Lungs/Pleura: Mild bibasilar atelectasis/scarring. There is no evidence of pulmonary edema, consolidation, pneumothorax, nodule or pleural fluid. Musculoskeletal: No chest wall abnormality. No acute or significant osseous findings. Review of the MIP images confirms the above findings. CTA ABDOMEN AND PELVIS FINDINGS VASCULAR Aorta: The abdominal aorta is normal in caliber and shows no evidence of aneurysm or dissection. Celiac: Normally patent.  Normal distal branching anatomy. SMA: Normally patent. Renals: Two separate renal arteries on the right and two separate renal arteries on the left. No evidence of renal artery stenosis. There is some irregularity in contour of the mid to distal aspect of the main upper right renal artery consistent with fibromuscular dysplasia. This vessel also demonstrates a small focal  aneurysm at a branch point near the superior renal hilum measuring approximately 9 x 10 x 10 mm and demonstrating rim of internal mural thrombus. IMA: Normally patent. Inflow: Bilateral iliac and common femoral arteries show normal patency. No aneurysm or significant obstructive disease. Review of the MIP images confirms the above findings. NON-VASCULAR Hepatobiliary: No focal liver abnormality is seen. No gallstones, gallbladder wall thickening, or biliary dilatation. Pancreas: Unremarkable. No pancreatic ductal dilatation or surrounding inflammatory changes. Spleen: Normal in size without focal abnormality. Adrenals/Urinary Tract: Adrenal glands are unremarkable. Kidneys are normal, without renal calculi, focal lesion, or hydronephrosis. Bladder is unremarkable. Stomach/Bowel: Bowel shows no evidence of obstruction. No obvious inflammatory process. Moderate fecal material throughout the colon. Mild amount of dependent higher density material is present in the stomach confirmed by the unenhanced  portion of the study. The stomach is not distended. No free air. Lymphatic: No enlarged lymph nodes identified in the abdomen or pelvis. Reproductive: Status post hysterectomy. No adnexal masses. Other: No abdominal wall hernia or abnormality. No abscess or ascites. Musculoskeletal: Degenerative disc disease present of the lumbar spine. Review of the MIP images confirms the above findings. IMPRESSION: 1. No acute aortic pathology identified. No evidence of aortic dissection. 2. No evidence of pulmonary embolism. 3. Mild dilatation of the ascending thoracic aorta measuring 4.1-4.2 cm in greatest diameter. Recommend annual imaging followup by CTA or MRA. This recommendation follows 2010 ACCF/AHA/AATS/ACR/ASA/SCA/SCAI/SIR/STS/SVM Guidelines for the Diagnosis and Management of Patients with Thoracic Aortic Disease. Circulation. 2010; 121: N817-R116 4. Evidence of fibromuscular dysplasia involving the dominant of 2 right renal  arteries. This vessel also shows a focal small aneurysm in its distal segment at a bifurcation point measuring 10 mm in diameter. Electronically Signed   By: Aletta Edouard M.D.   On: 11/06/2017 13:41        DISCUSSION:  The patient presents with profound hypotension and unresponsiveness but responded very quickly to resuscitation. Unclear why she became so obtunded and hypotensive. Her HB is 2-3 points down form her discharge 1 week ago and she does have guaic positive stools but no active melena or BRBPR presently. I don't believe the patient has a septic syndrome. She is not encepohalopathic presently. Her lactate was <1. Her WBC and differential are normal. Her CXR was negative. CT abdo/chest  Fairly remarkable.  ASSESSMENT / PLAN:  PULMONARY Respiratory status appears stable. The patient does not appear to b e breathless. Her 02 sats are 100% on 2l 02. Her CXR is normal  CARDIOVASCULAR  Patient is in a sinus brady with HR of 50-60 BP remains a little low,. The patient as noted does not appear to be actively bleeding presently. Her ECG shows a sinus brady with PVCs. ECHO 1 year ago showed a LVEF of >55% with mild LVH,. Cardiac cath 1 year ago did not appear to show any severe obstructive coronary diseases. 7/28 Patient in nSR. BP has been stable. No gross conduction abnormaliries or arrhythmia.  RENAL Normal renal fn. Her Creatinine had bumped up a tad yesterday but is returning to normal  GASTROINTESTINAL +FOBT. The patient will need a colonoscscopy given recent negativ e EGD. Hb went from 9.8 to 7.3 in the past 8 days. Hb 8.6 this AM after 2 units of PRBCs. No further melena or BRBPR. I have spoken to [facility]. They will see the patient today. Patient may have possiblke colonoscopy tomorrow.     INFECTIOUS  No obvious source of oinfx. The patient wass cultured ND STARTED ON EMPIRIC ABX THERAPY which I believe is reasoanble unfder the circumstances,. The patient had  bee treated with Cipro from a recent UTI. The UA showed only small leukocytes. There were 20-50 leuks The patient is on empiric aabx therapy.     NEUROLOGIC  the patient appears a little vague for recent history. Her speech is normal and apprpriate. Moves all her extremities.  Endocrine  We are checking serunm coiortisol level given hypotension. Cortisol level was ok at 17.5       Micheal Likens MD Pulmonary and [place] Pager: [phone] [place] [phone]  11/07/2017, 9:38 AM

## 2017-11-08 ENCOUNTER — Other Ambulatory Visit: Payer: Self-pay

## 2017-11-08 DIAGNOSIS — R55 Syncope and collapse: Secondary | ICD-10-CM

## 2017-11-08 DIAGNOSIS — K922 Gastrointestinal hemorrhage, unspecified: Secondary | ICD-10-CM

## 2017-11-08 LAB — BASIC METABOLIC PANEL
ANION GAP: 11 (ref 5–15)
BUN: 6 mg/dL — ABNORMAL LOW (ref 8–23)
CO2: 26 mmol/L (ref 22–32)
Calcium: 9.5 mg/dL (ref 8.9–10.3)
Chloride: 104 mmol/L (ref 98–111)
Creatinine, Ser: 0.76 mg/dL (ref 0.44–1.00)
Glucose, Bld: 123 mg/dL — ABNORMAL HIGH (ref 70–99)
POTASSIUM: 3.4 mmol/L — AB (ref 3.5–5.1)
Sodium: 141 mmol/L (ref 135–145)

## 2017-11-08 LAB — CBC
HEMATOCRIT: 34.4 % — AB (ref 36.0–46.0)
Hemoglobin: 11.2 g/dL — ABNORMAL LOW (ref 12.0–15.0)
MCH: 30.6 pg (ref 26.0–34.0)
MCHC: 32.6 g/dL (ref 30.0–36.0)
MCV: 94 fL (ref 78.0–100.0)
Platelets: 236 10*3/uL (ref 150–400)
RBC: 3.66 MIL/uL — AB (ref 3.87–5.11)
RDW: 13.1 % (ref 11.5–15.5)
WBC: 9.1 10*3/uL (ref 4.0–10.5)

## 2017-11-08 LAB — BLOOD PRODUCT ORDER (VERBAL) VERIFICATION

## 2017-11-08 MED ORDER — DILTIAZEM HCL 60 MG PO TABS
30.0000 mg | ORAL_TABLET | Freq: Four times a day (QID) | ORAL | Status: DC
  Administered 2017-11-08 – 2017-11-11 (×10): 30 mg via ORAL
  Filled 2017-11-08 (×10): qty 1

## 2017-11-08 NOTE — Progress Notes (Addendum)
PULMONARY / CRITICAL CARE MEDICINE   Name: Cynthia [name] MRN: [id number] DOB: 02-12-1941    ADMISSION DATE:  11/06/2017  REFERRING MD:  EDP / Dr. Reather Converse  CHIEF COMPLAINT: Arrest/post CPR   BRIEF SUMMARY:  77 year old female who presented to [facility] on 7/27 after collapsing at home and brief PEA arrest.    PMH:  PAF on Xarelto (stopped 10/2017 with concern for GIB), GIB (10/2017), HTN, CHF with diastolic dysfunction, HLD OSA.  Patient was admitted from 7/17-7/19 with a GI bleed.  Prior to admit she had noted 2 weeks of black stools.  Hemoglobin on admit 9.1 (Hgb 08/11/2017 was 9.5) was evaluated for GI and underwent EGD which was normal.  Injections were for the patient to hold her Xarelto and follow-up with GI on Monday 7/22.  Additionally she was treated for urinary tract infection with Rocephin.  Cultures were negative.  Discharge hemoglobin 9.8.  Per ER notes 7/27 the patient required 3 minutes of CPR before ROSC.  She will rhythm reported as wide-complex PEA.  She received 1 mg atropine for bradycardia into the 40s which improved to 60/70s.  She will EKG NSR.  On arrival to ER the patient was pale, diaphoretic but responsive.  She was treated with 1 L IV fluid without improvement in blood pressure.  Norepinephrine was started with improvement in blood pressure.  She was empirically treated with IV vancomycin and Zosyn for possible infection and admitted to the ICU for further observation.  Admit labs-TSH 1.341, WBC 5.0, Hgb 7.3, platelets 191, NA 139, K4.1, CL 107, CO2 24, glucose 132, BUN 19, creatinine 1.14, mag 2.0, alkaline phosphatase 35, albumin 2.9, AST 19, ALT 15, troponin 0.0 and lactic acid 1.51.  This x-ray was negative for acute process.  ICU course notable for no further bradycardia.  She was weaned off vasopressors on 7/2 2200.  Patient has had no reported melena / bleeding from below.  Upon further questioning of patient 7/29, she reports she does take iron at home.  She does not  member if her stool had actually changed in the recent months.  But she does remember blood on her tissue raises the concern of hemorrhoidal bleeding.  She has been on Xarelto until last admission for PAF.  Per cardiology she had a normal angiogram & LVEF in 2018.  He denies infectious symptoms such as fever, cough, shortness of breath, diarrhea or abdominal pain.  SUBJECTIVE: RN reports no acute events overnight.  Patient has remained stable without further episodes of bradycardia.  Patient has participated with physical therapy.  BP has been stable on Cozaar only/her home meds on hold.  VITAL SIGNS: BP 103/81 (BP Location: Left Arm)   Pulse 72   Temp 98.2 F (36.8 C) (Oral)   Resp (!) 26   Ht 5\' 5"  (1.651 m)   Wt 152 lb 1.9 oz (69 kg)   SpO2 98%   BMI 25.31 kg/m   HEMODYNAMICS:    VENTILATOR SETTINGS:    INTAKE / OUTPUT: I/O last 3 completed shifts: In: 4219.6 [P.O.:720; I.V.:1738.2; IV Piggyback:1761.4] Out: 3450 [Urine:3450]  PHYSICAL EXAMINATION: General: pleasant elderly female in NAD, husband at bedside  HEENT: MM pink/moist, no jvd, no carotid bruits Neuro: Awake, alert, difficulty with short term memory, MAE, speech clear, MAE CV: s1s2 irr, PVC's noted on monitor, no m/r/g PULM: even/non-labored, lungs bilaterally clear anterior, bibasilar crackles posterior  WU:JWJX, non-tender, bsx4 active  Extremities: warm/dry, no edema  Skin: no rashes or lesions  LABS:  BMET Recent Labs  Lab 11/06/17 1117 11/07/17 0423 11/08/17 0706  NA 139 141 141  K 4.1 3.7 3.4*  CL 107 111 104  CO2 24 22 26   BUN 19 10 6*  CREATININE 1.14* 0.84 0.76  GLUCOSE 132* 86 123*    Electrolytes Recent Labs  Lab 11/06/17 1117 11/07/17 0423 11/08/17 0706  CALCIUM 8.0* 8.3* 9.5  MG 2.0 2.0  --   PHOS  --  2.6  --     CBC Recent Labs  Lab 11/06/17 1807 11/07/17 0423 11/08/17 0706  WBC 6.6 7.1 9.1  HGB 8.9* 8.6* 11.2*  HCT 28.0* 26.6* 34.4*  PLT 223 205 236     Coag's Recent Labs  Lab 11/06/17 1807  APTT 32  INR 1.16    Sepsis Markers Recent Labs  Lab 11/06/17 1117 11/06/17 1205  LATICACIDVEN  --  1.51  PROCALCITON <0.10  --     ABG No results for input(s): PHART, PCO2ART, PO2ART in the last 168 hours.  Liver Enzymes Recent Labs  Lab 11/06/17 1117  AST 19  ALT 15  ALKPHOS 35*  BILITOT 0.6  ALBUMIN 2.9*    Cardiac Enzymes No results for input(s): TROPONINI, PROBNP in the last 168 hours.  Glucose No results for input(s): GLUCAP in the last 168 hours.  Imaging No results found.   STUDIES:  7/18 CT ABD/Pelvis >> no specific cause for GI bleeding identified, prominent stool throughout the colon favoring constipation 7/27 CT Head >> no evidence of acute intracranial abnormality.  Mild chronic small vessel white matter ischemic changes. 7/27 CTA Chest / ABD / Pelvis (dissection protocol) >> no acute aortic pathology identified, no evidence of aortic dissection, no evidence of pulmonary embolism, mild dilatation of the ascending thoracic aorta, evidence of fibromuscular dysplasia involving the dominant of 2 right renal arteries this vessel also shows a small aneurysm in its distal segment at the bifurcation point measuring 10 mm in diameter.  CULTURES: BCx2 7/27 >>   ANTIBIOTICS: Vancomycin 7/27 >> Zosyn 7/27 >>  SIGNIFICANT EVENTS: 7/17-7/19 admit with concern for GI bleed, hgb stable 7/27 admit with syncopal episode  LINES/TUBES:   DISCUSSION: 77 y/o F admitted after PEA arrest.  Recent admit for GIB.    ASSESSMENT / PLAN:   CARDIOVASCULAR A:  PEA Arrest - post 3 min CPR via EMS.   Bradycardia - resolved Hypotension - resolved  HTN P:  Transfer to SDU  Continue tele monitoring Assess ECHO  Consult her primary Cardiologist, Dr. Einar Gip for evaluation  Continue Cozaar Hold home Cardizem - ?  With recent illness if normal routine antihypertensives contributed to hypotension and  bradycardia  RENAL A:   Hypokalemia P:   Trend BMP / urinary output Replace electrolytes as indicated Avoid nephrotoxic agents, ensure adequate renal perfusion  GASTROINTESTINAL A:   Hx Melena - pt reportedly on iron, ? Dark stool from this as she has not required transfusion.   P:   Diet as tolerated    HEMATOLOGIC A:   Anemia  P:  Trend CBC Continue to hold Xarelto SCDs for DVT prophylaxis   INFECTIOUS A:   Recent UTI P:   Discontinue vancomycin and Zosyn  ENDOCRINE A:   Hypothyroidism - TSH wnl on admit.  P:   Continue Synthroid  NEUROLOGIC A:   Dementia - mild memory impairment History of vertigo P:   Supportive care PT evaluation Fall precautions Concerned some of her home medications could be contributing to dizziness/vertigo symptoms  FAMILY  - Updates: Patient and husband updated at bedside on plan of care.   -Level: Transfer to stepdown and to [facility] as of 7/30   Noe Gens, NP-[facility] Pgr: [phone] or if no answer [phone] 11/08/2017, 3:27 PM

## 2017-11-08 NOTE — Evaluation (Signed)
Physical Therapy Evaluation Patient Details Name: Birdie Fetty MRN: [id number] DOB: [birth year] Today's Date: 11/08/2017   History of Present Illness  77 yo female presenting to ED after PEA and underwent 3 minutes of CPR by EMS. PMH including A-fib on Xarelto, HTN, CHF with diastolic dysfuntion, HLD, acute GI bleeding, and OSA.  Clinical Impression  Pt resting in bed on arrival and willing to mobilize. Pt moving well with noted LOB with turning during gait. Pt with inability to recall room number during conversation and needed cues to attend to task as well as lines throughout session. PT reports she drives short distances and has gotten confused driving. PT educated to not drive at all and to have assist when leaving home for directional purposes. Pt with balance and cognitive deficits who will benefit froma acute therapy to maximize safety and independence.      Follow Up Recommendations Outpatient PT    Equipment Recommendations  None recommended by PT    Recommendations for Other Services       Precautions / Restrictions Precautions Precautions: Fall      Mobility  Bed Mobility Overal bed mobility: Needs Assistance Bed Mobility: Supine to Sit     Supine to sit: Supervision     General bed mobility comments: supervision for safety  Transfers Overall transfer level: Needs assistance Equipment used: None Transfers: Sit to/from Stand Sit to Stand: Supervision         General transfer comment: supervision for safety and lines   Ambulation/Gait Ambulation/Gait assistance: Min guard Gait Distance (Feet): 400 Feet Assistive device: Straight cane Gait Pattern/deviations: Step-through pattern;Decreased stride length   Gait velocity interpretation: >2.62 ft/sec, indicative of community ambulatory General Gait Details: loss of balance with turn/change in direction required close guardgin but able to recover on her own, generally steady with cane and straight hall  ambulation  Stairs            Wheelchair Mobility    Modified Rankin (Stroke Patients Only)       Balance Overall balance assessment: Needs assistance Sitting-balance support: No upper extremity supported;Feet supported Sitting balance-Leahy Scale: Good     Standing balance support: No upper extremity supported;During functional activity Standing balance-Leahy Scale: Fair                               Pertinent Vitals/Pain Pain Assessment: No/denies pain    Home Living Family/patient expects to be discharged to:: Private residence Living Arrangements: Spouse/significant other Available Help at Discharge: Family;Available 24 hours/day Type of Home: House Home Access: Stairs to enter Entrance Stairs-Rails: None Entrance Stairs-Number of Steps: 2 Home Layout: One level Home Equipment: Cane - single point;Grab bars - toilet      Prior Function Level of Independence: Independent         Comments: Drives, cooks, cleans. Walks dogs daily. Reporting "I will not be driving after this. It was scary and I don't want to pass out while driving. That would be dangerous."     Hand Dominance   Dominant Hand: Right    Extremity/Trunk Assessment   Upper Extremity Assessment Upper Extremity Assessment: Overall WFL for tasks assessed    Lower Extremity Assessment Lower Extremity Assessment: Overall WFL for tasks assessed    Cervical / Trunk Assessment Cervical / Trunk Assessment: Normal  Communication   Communication: No difficulties  Cognition Arousal/Alertness: Awake/alert Behavior During Therapy: WFL for tasks assessed/performed Overall Cognitive Status: Impaired/Different from  baseline Area of Impairment: Memory;Attention;Orientation;Safety/judgement                 Orientation Level: Place Current Attention Level: Selective Memory: Decreased short-term memory   Safety/Judgement: Decreased awareness of safety     General Comments: Pt  stating that she is at [employer] despite education. Could not problem solve how to find room number when talking. Stated vertigo was reason for code.       General Comments General comments (skin integrity, edema, etc.): BP supine: 135/66, sitting at EOB 109/54, standing 130/73, and end of session seating in recliner 125/80    Exercises     Assessment/Plan    PT Assessment Patient needs continued PT services  PT Problem List Decreased safety awareness;Decreased balance;Decreased cognition       PT Treatment Interventions Balance training;Gait training;Cognitive remediation;Patient/family education;Functional mobility training;Therapeutic activities    PT Goals (Current goals can be found in the Care Plan section)  Acute Rehab PT Goals Patient Stated Goal: Return to walking with dog PT Goal Formulation: With patient Time For Goal Achievement: 11/22/17 Potential to Achieve Goals: Good    Frequency Min 3X/week   Barriers to discharge        Co-evaluation               AM-PAC PT "6 Clicks" Daily Activity  Outcome Measure Difficulty turning over in bed (including adjusting bedclothes, sheets and blankets)?: None Difficulty moving from lying on back to sitting on the side of the bed? : None Difficulty sitting down on and standing up from a chair with arms (e.g., wheelchair, bedside commode, etc,.)?: None Help needed moving to and from a bed to chair (including a wheelchair)?: None Help needed walking in hospital room?: A Little Help needed climbing 3-5 steps with a railing? : A Little 6 Click Score: 22    End of Session Equipment Utilized During Treatment: Gait belt Activity Tolerance: Patient tolerated treatment well Patient left: with chair alarm set;in chair;with call bell/phone within reach Nurse Communication: Mobility status PT Visit Diagnosis: Other abnormalities of gait and mobility (R26.89)    Time: 4270-6237 PT Time Calculation (min) (ACUTE ONLY): 18  min   Charges:   PT Evaluation $PT Eval Moderate Complexity: 1 [address], [place]   Sandy Salaam Kadeshia Kasparian 11/08/2017, 12:53 PM

## 2017-11-08 NOTE — Progress Notes (Signed)
[facility] Progress Note Patient Name: Cynthia [name] DOB: May 28, 1940 MRN: [id number]   Date of Service  11/08/2017  HPI/Events of Note  Episode of AFIB with RVR - HR increased to 190's transiently. Now HR = 99 with NSR.  eICU Interventions  Will order: 1. Cardizem 30 mg PO now and Q 6 hours.  2. BMP and Mg++ level now.      Intervention Category Major Interventions: Arrhythmia - evaluation and management  Sommer,Steven Eugene 11/08/2017, 10:51 PM

## 2017-11-08 NOTE — Plan of Care (Signed)

## 2017-11-08 NOTE — Progress Notes (Signed)
[facility] PROGRESS NOTE Subjective Patient doing well with no signs of bleeding.  Hemoglobin 11.2 after transfusion  Objective: Vital signs in last 24 hours: Temp:  [98.2 F (36.8 C)-100.2 F (37.9 C)] 98.2 F (36.8 C) (07/29 1208) Resp:  [14-27] 26 (07/29 1208) BP: (103-167)/(54-81) 103/81 (07/29 1208) SpO2:  [98 %] 98 % (07/29 0800) Weight:  [69 kg (152 lb 1.9 oz)] 69 kg (152 lb 1.9 oz) (07/29 0500) Last BM Date: 11/08/17  Intake/Output from previous day: 07/28 0701 - 07/29 0700 In: 2945 [P.O.:720; I.V.:582.7; IV Piggyback:1642.4] Out: 2150 [Urine:2150] Intake/Output this shift: Total I/O In: 420.8 [P.O.:240; IV Piggyback:180.8] Out: 300 [Urine:300]  PE: General--alert watching TV reading a book no distress   Lab Results: Recent Labs    11/06/17 1117 11/06/17 1807 11/07/17 0423 11/08/17 0706  WBC 5.0 6.6 7.1 9.1  HGB 7.3* 8.9* 8.6* 11.2*  HCT 23.0* 28.0* 26.6* 34.4*  PLT 191 223 205 236   BMET Recent Labs    11/06/17 1117 11/07/17 0423 11/08/17 0706  NA 139 141 141  K 4.1 3.7 3.4*  CL 107 111 104  CO2 24 22 26   CREATININE 1.14* 0.84 0.76   LFT Recent Labs    11/06/17 1117  PROT 4.9*  AST 19  ALT 15  ALKPHOS 35*  BILITOT 0.6   PT/INR Recent Labs    11/06/17 1807  LABPROT 14.7  INR 1.16   PANCREAS No results for input(s): LIPASE in the last 72 hours.       Studies/Results: No results found.  Medications: I have reviewed the patient's current medications.  Assessment:   1.  Anemia/positive stool.  Patient had EGD that was negative during her last admission and left the hospital with a hemoglobin of 9.8 with plans to do an elective colonoscopy as outpatient.  She had some sort of cardiac event with pulseless electrical activity requiring CPR.  She received fluid resuscitation with large amounts of saline during this CPR and her hemoglobin did drop to 7.3 but there were no signs of active bleeding.  She has been transfused  her hemoglobin is now 11.2 and she is never had any further signs of bleeding.  It is not clear that acute bleeding was the cause of her cardiac event.   Plan: We will continue to evaluate for reasons for her cardiac event and we will remain on standby.  Still feel that she will need a colonoscopy and in view of her recent episode, would be reasonable to do that here in the hospital as soon as she is felt to be stable.  We will continue to follow.   Nancy Fetter 11/08/2017, 4:08 PM  This note was created using voice recognition software. Minor errors may Have occurred unintentionally.  Pager: [phone] If no answer or after hours call [phone]

## 2017-11-08 NOTE — Evaluation (Signed)
Occupational Therapy Evaluation Patient Details Name: Cynthia [name] MRN: [id number] DOB: [birth year] Today's Date: 11/08/2017    History of Present Illness 77 yo female presenting to ED after PEA and underwent 3 minutes of CPR by EMS. PMH including A-fib on Xarelto, HTN, CHF with diastolic dysfuntion, HLD, acute GI bleeding, and OSA.     Clinical Impression   PTA, pt was living with her husband and was independent with ADLs, IADLs, and caring for their two dogs. Pt currently requiring supervision-Min guard A for ADLs and functional mobility. BP and VSS stable during activity; see general comments. Pt presenting with decreased ST memory requiring increased cues during session; RN reports this is baseline. Pt would benefit from further acute OT to facilitate safe dc. Recommend dc to home with HHOT for further OT to optimize safety, independence with ADLs, and return to PLOF.      Follow Up Recommendations  Home health OT;Supervision/Assistance - 24 hour    Equipment Recommendations  None recommended by OT    Recommendations for Other Services PT consult     Precautions / Restrictions Precautions Precautions: Fall Restrictions Weight Bearing Restrictions: No      Mobility Bed Mobility Overal bed mobility: Needs Assistance Bed Mobility: Supine to Sit     Supine to sit: Supervision     General bed mobility comments: supervision for safety  Transfers Overall transfer level: Needs assistance Equipment used: None;Straight cane Transfers: Sit to/from Stand Sit to Stand: Min guard         General transfer comment: Min Guard A for safety    Balance Overall balance assessment: Needs assistance Sitting-balance support: No upper extremity supported;Feet supported Sitting balance-Leahy Scale: Good     Standing balance support: No upper extremity supported;During functional activity Standing balance-Leahy Scale: Fair                             ADL either  performed or assessed with clinical judgement   ADL Overall ADL's : Needs assistance/impaired Eating/Feeding: Set up;Sitting   Grooming: Oral care;Wash/dry face;Wash/dry hands;Brushing hair;Standing;Supervision/safety;Set up;Cueing for sequencing Grooming Details (indicate cue type and reason): Pt performing grooming task at sink with Supervision for safety. Pt presenting with decreased ST memory and requiring cues to recall she was asked to brush her hair and teeth after finishing toileting and hand hygiene.  Upper Body Bathing: Set up;Supervision/ safety;Sitting   Lower Body Bathing: Min guard;Sit to/from stand   Upper Body Dressing : Set up;Supervision/safety;Sitting   Lower Body Dressing: Min guard;Sit to/from stand Lower Body Dressing Details (indicate cue type and reason): Pt able to adjust socks while seated at EOB by crossing her legs and then bending forward. Min Guard A for safety in standing.  Toilet Transfer: Min guard;Ambulation;Regular Toilet   Toileting- Clothing Manipulation and Hygiene: Supervision/safety;Sitting/lateral lean       Functional mobility during ADLs: Min guard General ADL Comments: Pt demonstrating decreased ST memory. Overall, Supervision-min guard A for safety. VSS. See general comments for BP     Vision         Perception     Praxis      Pertinent Vitals/Pain Pain Assessment: No/denies pain     Hand Dominance Right   Extremity/Trunk Assessment Upper Extremity Assessment Upper Extremity Assessment: Overall WFL for tasks assessed   Lower Extremity Assessment Lower Extremity Assessment: Defer to PT evaluation   Cervical / Trunk Assessment Cervical / Trunk Assessment: Normal   Communication  Communication Communication: No difficulties   Cognition Arousal/Alertness: Awake/alert Behavior During Therapy: WFL for tasks assessed/performed Overall Cognitive Status: Within Functional Limits for tasks assessed                                  General Comments: Very pleasant and motivated to particiapte in therapy. ST memory deficits at baseline per RN. Pt presenting with decreased ST memory and requiring increased cues to recall instructions during session. For example, pt cued to performing oral care and brush her hair at sink after toileting. Pt requiring cues for recall and to perform grooming tasks   General Comments  BP supine: 135/66, sitting at EOB 109/54, standing 130/73, and end of session seating in recliner 125/80    Exercises     Shoulder Instructions      Home Living Family/patient expects to be discharged to:: Private residence Living Arrangements: Spouse/significant other Available Help at Discharge: Family;Available 24 hours/day Type of Home: House Home Access: Stairs to enter [employer] of Steps: 2 Entrance Stairs-Rails: None Home Layout: One level     Bathroom Shower/Tub: Tub/shower unit;Curtain         Home Equipment: Cane - single point;Grab bars - toilet          Prior Functioning/Environment Level of Independence: Independent        Comments: Drives, cooks, cleans. Walks dogs daily. Reporting "I will not be driving after this. It was scary and I don't want to pass out while driving. That would be dangerous."        OT Problem List: Decreased activity tolerance;Impaired balance (sitting and/or standing);Decreased knowledge of use of DME or AE;Decreased knowledge of precautions;Decreased cognition;Cardiopulmonary status limiting activity      OT Treatment/Interventions: Self-care/ADL training;Therapeutic exercise;Energy conservation;DME and/or AE instruction;Therapeutic activities;Patient/family education    OT Goals(Current goals can be found in the care plan section) Acute Rehab OT Goals Patient Stated Goal: "Find out why this has happened to me." OT Goal Formulation: With patient Time For Goal Achievement: 11/22/17 Potential to Achieve Goals: Good ADL  Goals Pt Will Perform Grooming: with modified independence;standing Pt Will Perform Lower Body Dressing: with modified independence;sit to/from stand Pt Will Transfer to Toilet: with modified independence;ambulating;regular height toilet Additional ADL Goal #1: Pt will verbalize three fall prevention techniques with 1-2 VCs Additional ADL Goal #2: Pt will perform four ADLs with 1-2 cues for memory  OT Frequency: Min 2X/week   Barriers to D/C:            Co-evaluation              AM-PAC PT "6 Clicks" Daily Activity     Outcome Measure Help from another person eating meals?: None Help from another person taking care of personal grooming?: A Little Help from another person toileting, which includes using toliet, bedpan, or urinal?: A Little Help from another person bathing (including washing, rinsing, drying)?: A Little Help from another person to put on and taking off regular upper body clothing?: None Help from another person to put on and taking off regular lower body clothing?: A Little 6 Click Score: 20   End of Session Nurse Communication: Mobility status(Pt wanting a pen and pad of paper for note taking)  Activity Tolerance: Patient tolerated treatment well Patient left: in chair;with call bell/phone within reach;with nursing/sitter in room  OT Visit Diagnosis: Unsteadiness on feet (R26.81);Other abnormalities of gait and mobility (R26.89);Muscle weakness (  generalized) (M62.81);Other symptoms and signs involving cognitive function                Time: 8347-5830 OT Time Calculation (min): 26 min Charges:  OT General Charges $OT Visit: 1 Visit OT Evaluation $OT Eval Moderate Complexity: 1 Mod OT Treatments $Self Care/Home Management : 8-22 mins  Eliu Batch MSOT, OTR/L Acute Rehab Pager: [phone] Office: [place] 11/08/2017, 11:19 AM

## 2017-11-09 ENCOUNTER — Inpatient Hospital Stay (HOSPITAL_COMMUNITY): Payer: Medicare PPO

## 2017-11-09 DIAGNOSIS — R001 Bradycardia, unspecified: Secondary | ICD-10-CM

## 2017-11-09 LAB — MAGNESIUM: Magnesium: 1.9 mg/dL (ref 1.7–2.4)

## 2017-11-09 LAB — CBC
HEMATOCRIT: 35.1 % — AB (ref 36.0–46.0)
Hemoglobin: 11.5 g/dL — ABNORMAL LOW (ref 12.0–15.0)
MCH: 30.8 pg (ref 26.0–34.0)
MCHC: 32.8 g/dL (ref 30.0–36.0)
MCV: 94.1 fL (ref 78.0–100.0)
PLATELETS: 239 10*3/uL (ref 150–400)
RBC: 3.73 MIL/uL — ABNORMAL LOW (ref 3.87–5.11)
RDW: 13 % (ref 11.5–15.5)
WBC: 9 10*3/uL (ref 4.0–10.5)

## 2017-11-09 LAB — BASIC METABOLIC PANEL
ANION GAP: 8 (ref 5–15)
BUN: 13 mg/dL (ref 8–23)
CO2: 27 mmol/L (ref 22–32)
Calcium: 9.4 mg/dL (ref 8.9–10.3)
Chloride: 106 mmol/L (ref 98–111)
Creatinine, Ser: 0.82 mg/dL (ref 0.44–1.00)
GFR calc Af Amer: >60 mL/min (ref >60)
GFR calc non Af Amer: >60 mL/min (ref >60)
Glucose, Bld: 139 mg/dL — ABNORMAL HIGH (ref 70–99)
POTASSIUM: 3.6 mmol/L (ref 3.5–5.1)
Sodium: 141 mmol/L (ref 135–145)

## 2017-11-09 LAB — COMPREHENSIVE METABOLIC PANEL
ALBUMIN: 3.5 g/dL (ref 3.5–5.0)
ALT: 21 U/L (ref 0–44)
ANION GAP: 9 (ref 5–15)
AST: 27 U/L (ref 15–41)
Alkaline Phosphatase: 50 U/L (ref 38–126)
BILIRUBIN TOTAL: 0.9 mg/dL (ref 0.3–1.2)
BUN: 12 mg/dL (ref 8–23)
CO2: 27 mmol/L (ref 22–32)
Calcium: 9.3 mg/dL (ref 8.9–10.3)
Chloride: 105 mmol/L (ref 98–111)
Creatinine, Ser: 0.82 mg/dL (ref 0.44–1.00)
GFR calc Af Amer: >60 mL/min (ref >60)
GFR calc non Af Amer: >60 mL/min (ref >60)
GLUCOSE: 141 mg/dL — AB (ref 70–99)
POTASSIUM: 3.4 mmol/L — AB (ref 3.5–5.1)
SODIUM: 141 mmol/L (ref 135–145)
TOTAL PROTEIN: 6.2 g/dL — AB (ref 6.5–8.1)

## 2017-11-09 MED ORDER — LUTEIN-ZEAXANTHIN 25-5 MG PO CAPS: 1.0000 | ORAL_CAPSULE | Freq: Every day | ORAL | Status: DC

## 2017-11-09 MED ORDER — PANTOPRAZOLE SODIUM 40 MG PO TBEC
40.0000 mg | DELAYED_RELEASE_TABLET | Freq: Every day | ORAL | Status: DC
  Administered 2017-11-10 – 2017-11-11 (×2): 40 mg via ORAL
  Filled 2017-11-09 (×2): qty 1

## 2017-11-09 MED ORDER — OMEGA-3-ACID ETHYL ESTERS 1 G PO CAPS
1.0000 g | ORAL_CAPSULE | Freq: Every day | ORAL | Status: DC
  Administered 2017-11-09 – 2017-11-11 (×3): 1 g via ORAL
  Filled 2017-11-09 (×3): qty 1

## 2017-11-09 MED ORDER — POTASSIUM CHLORIDE CRYS ER 20 MEQ PO TBCR
40.0000 meq | EXTENDED_RELEASE_TABLET | Freq: Once | ORAL | Status: AC
  Administered 2017-11-09: 40 meq via ORAL
  Filled 2017-11-09: qty 2

## 2017-11-09 MED ORDER — CYCLOSPORINE 0.05 % OP EMUL
1.0000 [drp] | Freq: Two times a day (BID) | OPHTHALMIC | Status: DC
  Administered 2017-11-09 – 2017-11-11 (×5): 1 [drp] via OPHTHALMIC
  Filled 2017-11-09 (×6): qty 1

## 2017-11-09 MED ORDER — MELATONIN 3 MG PO TABS
3.0000 mg | ORAL_TABLET | Freq: Every day | ORAL | Status: DC
  Administered 2017-11-09 – 2017-11-10 (×2): 3 mg via ORAL
  Filled 2017-11-09 (×3): qty 1

## 2017-11-09 MED ORDER — ADULT MULTIVITAMIN W/MINERALS CH
1.0000 | ORAL_TABLET | Freq: Every day | ORAL | Status: DC
  Administered 2017-11-09 – 2017-11-11 (×3): 1 via ORAL
  Filled 2017-11-09 (×3): qty 1

## 2017-11-09 NOTE — Progress Notes (Signed)
  Echocardiogram 2D Echocardiogram has been performed.  Cynthia [name] 11/09/2017, 10:03 AM

## 2017-11-09 NOTE — Progress Notes (Signed)
Cynthia [name] TEAM 1 - Stepdown/ICU TEAM  Cynthia [name]  UJW:[id number] DOB: [birth year] DOA: 11/06/2017 PCP: Deland Pretty, MD    Brief Narrative:  76 year old female w/ a hx of PAF on Xarelto (stopped 10/2017 with concern for GIB), GIB (08/6211), HTN, diastolic CHF, HLD, and OSA who presented to [facility] on 7/27 after collapsing at home w/ a brief PEA arrest.  She required 3 minutes of CPR before ROSC. She received 1 mg atropine for bradycardia into the 40s which improved to 60/70s. On arrival to ER the patient was pale, diaphoretic but responsive.  She was treated with 1 L IV fluid without improvement in blood pressure.  Norepinephrine was started with improvement in blood pressure.  She was empirically treated with IV vancomycin and Zosyn for possible infection and admitted to the ICU for further observation.    Patient was previously admitted 7/17-7/19 with a GI bleed.  Prior to admit she had noted 2 weeks of black stools.  Hemoglobin on admit 9.1.  She underwent EGD which was normal. Instructions were for the patient to hold her Xarelto and follow-up with GI on Monday 7/22. Additionally she was treated for urinary tract infection with Rocephin.  Cultures were negative.  Discharge hemoglobin 9.8.  ICU course notable for no further bradycardia.  She was weaned off vasopressors on 7/2 2200.  Patient has had no reported melena / bleeding from below.  Significant Events: 7/27 admit after PEA arrest 7/30 [facility] assumed care   Subjective: The patient is resting comfortably in bed.  She denies any complaints whatsoever.  She specifically denies chest pain shortness breath fevers chills nausea vomiting or abdominal pain.  Assessment & Plan:  ?PEA Arrest v/s syncopal spell  64mins CPR by EMS - Dr. Einar Gip consulted by [facility] for an evaluation - TTE pending   Bradycardia resolved   Hypotension resolved   Hypokalemia  Supplement to goal of 4.0 - Mg is normal  Anemia  holding xarelto - no evidence of  active bleeding at this time - s/p 1U PRBC 7/27  Recent Labs  Lab 11/06/17 1117 11/06/17 1807 11/07/17 0423 11/08/17 0706 11/09/17 0007  HGB 7.3* 8.9* 8.6* 11.2* 11.5*    Recent GIB GI following - plan for colonoscopy while inpatient once cleared from cardiac standpoint   DVT prophylaxis: SCDs Code Status: FULL CODE Family Communication: no family present at time of exam  Disposition Plan: stable for tele bed   Consultants:  PCCM GI   Antimicrobials:  Vancomycin 7/27 > 7/29 Zosyn 7/27 > 7/28  Objective: Blood pressure 132/75, pulse 72, temperature 98.6 F (37 C), temperature source Oral, resp. rate 16, height 5\' 5"  (1.651 m), weight 65.4 kg (144 lb 2.9 oz), SpO2 94 %.  Intake/Output Summary (Last 24 hours) at 11/09/2017 1055 Last data filed at 11/09/2017 0900 Gross per 24 hour  Intake 989.94 ml  Output 625 ml  Net 364.94 ml   Filed Weights   11/07/17 0500 11/08/17 0500 11/09/17 0500  Weight: 69.4 kg (153 lb) 69 kg (152 lb 1.9 oz) 65.4 kg (144 lb 2.9 oz)   Examination: General: No acute respiratory distress Lungs: Clear to auscultation bilaterally without wheezes or crackles Cardiovascular: Regular rate and rhythm without murmur gallop or rub normal S1 and S2 Abdomen: Nontender, nondistended, soft, bowel sounds positive, no rebound, no ascites, no appreciable mass Extremities: No significant cyanosis, clubbing, or edema bilateral lower extremities  CBC: Recent Labs  Lab 11/06/17 1117  11/07/17 0423 11/08/17 0706 11/09/17 0007  WBC 5.0   < > 7.1 9.1 9.0  NEUTROABS 2.9  --   --   --   --   HGB 7.3*   < > 8.6* 11.2* 11.5*  HCT 23.0*   < > 26.6* 34.4* 35.1*  MCV 99.1   < > 96.4 94.0 94.1  PLT 191   < > 205 236 239   < > = values in this interval not displayed.   Basic Metabolic Panel: Recent Labs  Lab 11/06/17 1117 11/07/17 0423 11/08/17 0706 11/08/17 2347 11/09/17 0007  NA 139 141 141 141 141  K 4.1 3.7 3.4* 3.6 3.4*  CL 107 111 104 106 105  CO2  24 22 26 27 27   GLUCOSE 132* 86 123* 139* 141*  BUN 19 10 6* 13 12  CREATININE 1.14* 0.84 0.76 0.82 0.82  CALCIUM 8.0* 8.3* 9.5 9.4 9.3  MG 2.0 2.0  --  1.9  --   PHOS  --  2.6  --   --   --    GFR: Estimated Creatinine Clearance: 53.3 mL/min (by C-G formula based on SCr of 0.82 mg/dL).  Liver Function Tests: Recent Labs  Lab 11/06/17 1117 11/09/17 0007  AST 19 27  ALT 15 21  ALKPHOS 35* 50  BILITOT 0.6 0.9  PROT 4.9* 6.2*  ALBUMIN 2.9* 3.5    Coagulation Profile: Recent Labs  Lab 11/06/17 1807  INR 1.16    HbA1C: Hgb A1c MFr Bld  Date/Time Value Ref Range Status  11/24/2016 12:40 AM 5.6 4.8 - 5.6 % Final    Comment:    (NOTE)         Prediabetes: 5.7 - 6.4         Diabetes: >6.4         Glycemic control for adults with diabetes: <7.0     Recent Results (from the past 240 hour(s))  Blood Culture (routine x 2)     Status: None (Preliminary result)   Collection Time: 11/06/17 11:36 AM  Result Value Ref Range Status   Specimen Description BLOOD RIGHT ANTECUBITAL  Final   Special Requests   Final    BOTTLES DRAWN AEROBIC AND ANAEROBIC Blood Culture results may not be optimal due to an excessive volume of blood received in culture bottles   Culture   Final    NO GROWTH 2 DAYS Performed at [facility], [place] [address] [place], [place] [address]    Report Status PENDING  Incomplete  Blood Culture (routine x 2)     Status: None (Preliminary result)   Collection Time: 11/06/17 11:39 AM  Result Value Ref Range Status   Specimen Description BLOOD RIGHT FOREARM  Final   Special Requests   Final    BOTTLES DRAWN AEROBIC AND ANAEROBIC Blood Culture adequate volume   Culture   Final    NO GROWTH 2 DAYS Performed at [facility], [place] [address], [place], [place] [address]    Report Status PENDING  Incomplete  MRSA PCR Screening     Status: None   Collection Time: 11/06/17  3:46 PM  Result Value Ref Range Status   MRSA by PCR NEGATIVE NEGATIVE Final     Comment:        The GeneXpert MRSA Assay (FDA approved for NASAL specimens only), is one component of a comprehensive MRSA colonization surveillance program. It is not intended to diagnose MRSA infection nor to guide or monitor treatment for MRSA infections. Performed at [facility], [place] [address], [place], [place] [address]      Scheduled Meds: . diltiazem  30 mg Oral Q6H  . losartan  100 mg Oral Daily  . thyroid  60 mg Oral QAC breakfast     LOS: 3 days   Cherene Altes, MD [facility] Office  [phone] Pager - Text Page per Amion  If 7PM-7AM, please contact night-coverage per Amion 11/09/2017, 10:55 AM

## 2017-11-09 NOTE — Progress Notes (Signed)
Physical Therapy Treatment Patient Details Name: Cynthia [name] MRN: [id number] DOB: Nov 24, 1940 Today's Date: 11/09/2017    History of Present Illness 77 yo female presenting to ED after PEA and underwent 3 minutes of CPR by EMS. PMH including A-fib on Xarelto, HTN, CHF with diastolic dysfuntion, HLD, acute GI bleeding, and OSA.    PT Comments    Pt very pleasant and moving well. Pt continues to be limited by cognitive deficits but showed improved balance with cane during gait today. Pt again educated for need to have supervision when leaving home, should not be driving and to use cane at all times. Pt near her baseline and OPPT would be beneficial to decrease fall risk. Recommend daily ambulation with nursing.   HR 82-91 during gait    Follow Up Recommendations  Outpatient PT     Equipment Recommendations  None recommended by PT    Recommendations for Other Services       Precautions / Restrictions Precautions Precautions: Fall    Mobility  Bed Mobility Overal bed mobility: Independent                Transfers Overall transfer level: Modified independent               General transfer comment: stood from bed and toilet without difficulty  Ambulation/Gait Ambulation/Gait assistance: Supervision Gait Distance (Feet): 500 Feet Assistive device: Straight cane Gait Pattern/deviations: Step-through pattern;Decreased stride length   Gait velocity interpretation: >2.62 ft/sec, indicative of community ambulatory General Gait Details: generally steady with straight hall ambulation and turns, slight loss of balance with distraction and talking with gait    Stairs             Wheelchair Mobility    Modified Rankin (Stroke Patients Only)       Balance Overall balance assessment: Needs assistance Sitting-balance support: No upper extremity supported;Feet supported Sitting balance-Leahy Scale: Good       Standing balance-Leahy Scale: Fair    Single Leg Stance - Right Leg: 5 Single Leg Stance - Left Leg: 2     Rhomberg - Eyes Opened: 60 Rhomberg - Eyes Closed: 20 High level balance activites: Turns;Head turns High Level Balance Comments: maintained balance with mild deviation with vertical and horizontal head turns, able to pick up and slow down speed on command while maintaining balance             Cognition Arousal/Alertness: Awake/alert Behavior During Therapy: WFL for tasks assessed/performed Overall Cognitive Status: Impaired/Different from baseline Area of Impairment: Memory;Attention;Safety/judgement                   Current Attention Level: Selective     Safety/Judgement: Decreased awareness of safety            Exercises      General Comments        Pertinent Vitals/Pain Pain Assessment: No/denies pain    Home Living                      Prior Function            PT Goals (current goals can now be found in the care plan section) Progress towards PT goals: Progressing toward goals    Frequency    Min 3X/week      PT Plan Current plan remains appropriate    Co-evaluation              AM-PAC PT "6 Clicks" Daily Activity  Outcome Measure  Difficulty turning over in bed (including adjusting bedclothes, sheets and blankets)?: None Difficulty moving from lying on back to sitting on the side of the bed? : None Difficulty sitting down on and standing up from a chair with arms (e.g., wheelchair, bedside commode, etc,.)?: None Help needed moving to and from a bed to chair (including a wheelchair)?: None Help needed walking in hospital room?: A Little Help needed climbing 3-5 steps with a railing? : A Little 6 Click Score: 22    End of Session Equipment Utilized During Treatment: Gait belt Activity Tolerance: Patient tolerated treatment well Patient left: with chair alarm set;in chair;with call bell/phone within reach Nurse Communication: Mobility status PT  Visit Diagnosis: Other abnormalities of gait and mobility (R26.89)     Time: 1225-1250 PT Time Calculation (min) (ACUTE ONLY): 25 min  Charges:  $Gait Training: 8-22 mins $Therapeutic Activity: 8-22 mins                     [address], [place]    [place] 11/09/2017, 1:04 PM

## 2017-11-10 ENCOUNTER — Encounter (HOSPITAL_COMMUNITY): Payer: Self-pay | Admitting: General Practice

## 2017-11-10 DIAGNOSIS — I469 Cardiac arrest, cause unspecified: Secondary | ICD-10-CM

## 2017-11-10 DIAGNOSIS — I959 Hypotension, unspecified: Secondary | ICD-10-CM

## 2017-11-10 DIAGNOSIS — D649 Anemia, unspecified: Secondary | ICD-10-CM

## 2017-11-10 LAB — BPAM RBC
Blood Product Expiration Date: 201908142359
Blood Product Expiration Date: 201908152359
ISSUE DATE / TIME: 201907271315
UNIT TYPE AND RH: 6200
UNIT TYPE AND RH: 6200

## 2017-11-10 LAB — ECHOCARDIOGRAM COMPLETE
Height: 65 in
Weight: 2306.89 oz

## 2017-11-10 LAB — TYPE AND SCREEN
ABO/RH(D): A POS
ANTIBODY SCREEN: NEGATIVE
UNIT DIVISION: 0
Unit division: 0

## 2017-11-10 LAB — COMPREHENSIVE METABOLIC PANEL
ALT: 21 U/L (ref 0–44)
AST: 22 U/L (ref 15–41)
Albumin: 3.3 g/dL — ABNORMAL LOW (ref 3.5–5.0)
Alkaline Phosphatase: 42 U/L (ref 38–126)
Anion gap: 9 (ref 5–15)
BUN: 18 mg/dL (ref 8–23)
CHLORIDE: 107 mmol/L (ref 98–111)
CO2: 25 mmol/L (ref 22–32)
Calcium: 9.2 mg/dL (ref 8.9–10.3)
Creatinine, Ser: 0.96 mg/dL (ref 0.44–1.00)
GFR calc non Af Amer: 56 mL/min — ABNORMAL LOW (ref >60)
Glucose, Bld: 125 mg/dL — ABNORMAL HIGH (ref 70–99)
Potassium: 4.3 mmol/L (ref 3.5–5.1)
SODIUM: 141 mmol/L (ref 135–145)
Total Bilirubin: 0.6 mg/dL (ref 0.3–1.2)
Total Protein: 5.9 g/dL — ABNORMAL LOW (ref 6.5–8.1)

## 2017-11-10 LAB — CBC
HEMATOCRIT: 34.6 % — AB (ref 36.0–46.0)
Hemoglobin: 11.2 g/dL — ABNORMAL LOW (ref 12.0–15.0)
MCH: 31.1 pg (ref 26.0–34.0)
MCHC: 32.4 g/dL (ref 30.0–36.0)
MCV: 96.1 fL (ref 78.0–100.0)
PLATELETS: 237 10*3/uL (ref 150–400)
RBC: 3.6 MIL/uL — AB (ref 3.87–5.11)
RDW: 13.1 % (ref 11.5–15.5)
WBC: 8.7 10*3/uL (ref 4.0–10.5)

## 2017-11-10 MED ORDER — LOSARTAN POTASSIUM 50 MG PO TABS
50.0000 mg | ORAL_TABLET | Freq: Every day | ORAL | Status: DC
  Administered 2017-11-11: 50 mg via ORAL
  Filled 2017-11-10: qty 1

## 2017-11-10 NOTE — Progress Notes (Addendum)
PROGRESS NOTE    Cynthia [name]  RWE:[id number] DOB: 1940-08-03 DOA: 11/06/2017 PCP: Deland Pretty, MD    Brief Narrative:  77 year old female who presented after pulseless electrical activity cardiac arrest.  She does have significant past medical history for atrial fibrillation, recent gastrointestinal bleed, and hypertension.  On the day of admission she stood up from a chair, severe vertigo and visual disturbances, then lost her consciousness.  Required CPR for 2 minutes, recovering spontaneous circulation, post event she had significant bradycardia, 40 bpm, responded well to atropine.  In the emergency department she was hypotensive, refractory to IV fluids, required vasopressors.  Vital signs and blood pressure 77/41, heart rate 56, temperature 97, respiratory 16, expiration 9%.  Patient was awake and alert, lungs clear to auscultation bilaterally, heart S1-S2 present irregularly irregular, abdomen soft, nontender, no lower extremity edema.  Patient was admitted to the intensive care unit with the working diagnosis of refractive hypotension.   Assessment & Plan:   Active Problems:   GI bleed  1. PEA cardiac arrest. Will continue telemetry monitoring, possible orthostatic event, complicated with vagal mediated bradycardia and hypotension. Patient at home on diltiazem. Physical therapy evaluation. Echocardiography with normal LV systolic function.   2. Atrial fibrillation. Will continue po diltiazem short acting, holding anticoagulation due to recent GI bleed, will need outpatient colonoscopy.   3. Hypokalemia. Improved electrolytes, follow on renal panel in am. Serum cr at 0,96 and K at 4,3 with serum bicarbonate at 25.   4. Chronic anemia. Multifactorial, hb and hct stable.   5. HTN. Continue blood pressure control with losartan 50 mg daily.  6. Hypothyroid. Continue hormonal replacement.    DVT prophylaxis: scd  Code Status:  full Family Communication: no family at the bedside   Disposition Plan/ discharge barriers: pending physical therapy evaluation, and 24 more hours of cardiac monitoring.    Consultants:   GI   Procedures:     Antimicrobials:       Subjective: Patient is feeling better, no nausea or vomiting, no chest pain or dyspnea, has been out of bed. On the day of admission patient stood up from a chair, developed severe vertigo, visual disturbances and then loss her consciousness. Had vertigo in the past.    Objective: Vitals:   11/10/17 0500 11/10/17 0600 11/10/17 0700 11/10/17 0800  BP:    (!) 125/105  Pulse: 78 71 65 86  Resp: 16 14 17 18   Temp:  98.9 F (37.2 C)    TempSrc:  Oral    SpO2: 96% 98% 98% 95%  Weight: 65 kg (143 lb 4.8 oz)     Height:        Intake/Output Summary (Last 24 hours) at 11/10/2017 0857 Last data filed at 11/10/2017 0800 Gross per 24 hour  Intake 1180 ml  Output 650 ml  Net 530 ml   Filed Weights   11/08/17 0500 11/09/17 0500 11/10/17 0500  Weight: 69 kg (152 lb 1.9 oz) 65.4 kg (144 lb 2.9 oz) 65 kg (143 lb 4.8 oz)    Examination:   General: Not in pain or dyspnea Neurology: Awake and alert, non focal  E ENT: no pallor, no icterus, oral mucosa moist Cardiovascular: No JVD. S1-S2 present, rhythmic, no gallops, rubs, or murmurs. No lower extremity edema. Pulmonary: vesicular breath sounds bilaterally, adequate air movement, no wheezing, rhonchi or rales. Gastrointestinal. Abdomen with no organomegaly, non tender, no rebound or guarding Skin. No rashes Musculoskeletal: no joint deformities     Data  Reviewed: I have personally reviewed following labs and imaging studies  CBC: Recent Labs  Lab 11/06/17 1117 11/06/17 1807 11/07/17 0423 11/08/17 0706 11/09/17 0007 11/10/17 0321  WBC 5.0 6.6 7.1 9.1 9.0 8.7  NEUTROABS 2.9  --   --   --   --   --   HGB 7.3* 8.9* 8.6* 11.2* 11.5* 11.2*  HCT 23.0* 28.0* 26.6* 34.4* 35.1* 34.6*  MCV 99.1 97.6 96.4 94.0 94.1 96.1  PLT 191 223 205 236 239 237     Basic Metabolic Panel: Recent Labs  Lab 11/06/17 1117 11/07/17 0423 11/08/17 0706 11/08/17 2347 11/09/17 0007 11/10/17 0321  NA 139 141 141 141 141 141  K 4.1 3.7 3.4* 3.6 3.4* 4.3  CL 107 111 104 106 105 107  CO2 24 22 26 27 27 25   GLUCOSE 132* 86 123* 139* 141* 125*  BUN 19 10 6* 13 12 18   CREATININE 1.14* 0.84 0.76 0.82 0.82 0.96  CALCIUM 8.0* 8.3* 9.5 9.4 9.3 9.2  MG 2.0 2.0  --  1.9  --   --   PHOS  --  2.6  --   --   --   --    GFR: Estimated Creatinine Clearance: 45.6 mL/min (by C-G formula based on SCr of 0.96 mg/dL). Liver Function Tests: Recent Labs  Lab 11/06/17 1117 11/09/17 0007 11/10/17 0321  AST 19 27 22   ALT 15 21 21   ALKPHOS 35* 50 42  BILITOT 0.6 0.9 0.6  PROT 4.9* 6.2* 5.9*  ALBUMIN 2.9* 3.5 3.3*   No results for input(s): LIPASE, AMYLASE in the last 168 hours. No results for input(s): AMMONIA in the last 168 hours. Coagulation Profile: Recent Labs  Lab 11/06/17 1807  INR 1.16   Cardiac Enzymes: No results for input(s): CKTOTAL, CKMB, CKMBINDEX, TROPONINI in the last 168 hours. BNP (last 3 results) No results for input(s): PROBNP in the last 8760 hours. HbA1C: No results for input(s): HGBA1C in the last 72 hours. CBG: No results for input(s): GLUCAP in the last 168 hours. Lipid Profile: No results for input(s): CHOL, HDL, LDLCALC, TRIG, CHOLHDL, LDLDIRECT in the last 72 hours. Thyroid Function Tests: No results for input(s): TSH, T4TOTAL, FREET4, T3FREE, THYROIDAB in the last 72 hours. Anemia Panel: No results for input(s): VITAMINB12, FOLATE, FERRITIN, TIBC, IRON, RETICCTPCT in the last 72 hours.    Radiology Studies: I have reviewed all of the imaging during this hospital visit personally     Scheduled Meds: . cycloSPORINE  1 drop Both Eyes BID  . diltiazem  30 mg Oral Q6H  . losartan  100 mg Oral Daily  . Melatonin  3 mg Oral QHS  . multivitamin with minerals  1 tablet Oral Daily  . omega-3 acid ethyl esters  1 g Oral  Daily  . pantoprazole  40 mg Oral QAC breakfast  . thyroid  60 mg Oral QAC breakfast   Continuous Infusions: . sodium chloride Stopped (11/09/17 1900)     LOS: 4 days        Fotini Lemus Gerome Apley, MD [facility] Pager [phone]

## 2017-11-10 NOTE — Progress Notes (Signed)
Pt transported to 6E28 on tele with belongings on room air.  Report given to RN Darlene and patient placed in [place] tele box.

## 2017-11-10 NOTE — Progress Notes (Signed)
Occupational Therapy Treatment Patient Details Name: Cynthia [name] MRN: [id number] DOB: 06/23/1940 Today's Date: 11/10/2017    History of present illness 77 yo female presenting to ED after PEA and underwent 3 minutes of CPR by EMS. PMH including A-fib on Xarelto, HTN, CHF with diastolic dysfuntion, HLD, acute GI bleeding, and OSA.   OT comments  Pt progressing towards established OT goals. Pt performing toileting, hand hygiene, and functional mobility with supervision and SPC. Providing pt with handout and education on fall prevention; pt and husband verbalizing understanding and techniques they plan to implement at home. Educating pt and husband on use of tub bench for safe tub transfers. Continue to recommend dc home with HHOT and will continue to follow acutely as admitted.    Follow Up Recommendations  Home health OT;Supervision/Assistance - 24 hour    Equipment Recommendations  None recommended by OT    Recommendations for Other Services PT consult    Precautions / Restrictions Precautions Precautions: Fall Restrictions Weight Bearing Restrictions: No       Mobility Bed Mobility Overal bed mobility: Independent Bed Mobility: Supine to Sit     Supine to sit: Supervision     General bed mobility comments: supervision for safety  Transfers Overall transfer level: Needs assistance Equipment used: None Transfers: Sit to/from Stand Sit to Stand: Supervision         General transfer comment: supervision for safety    Balance Overall balance assessment: Needs assistance Sitting-balance support: No upper extremity supported;Feet supported Sitting balance-Leahy Scale: Good     Standing balance support: No upper extremity supported;During functional activity Standing balance-Leahy Scale: Fair                             ADL either performed or assessed with clinical judgement   ADL Overall ADL's : Needs assistance/impaired     Grooming: Wash/dry  hands;Supervision/safety;Standing Grooming Details (indicate cue type and reason): Hand hygiene after toileting with supervision for safety                 Toilet Transfer: Ambulation;Regular Chartered certified accountant Details (indicate cue type and reason): supervision for safety Toileting- Clothing Manipulation and Hygiene: Supervision/safety;Sitting/lateral lean   Tub/ Shower Transfer: Tub transfer;Tub Buyer, retail Details (indicate cue type and reason): Discussed use of tub bench for transfers into tub. pt owns tub bench, but had been using it beside the tub for dressing.  Functional mobility during ADLs: Cane;Supervision/safety General ADL Comments: Pt demosntrating increased activity tolerance and balance. Pt performing functional mobility in hallway to gather drink items including soda, cup, straw, and cup lid. Providing pt and husband with fall prevention handout. Reviewed handout in full and pt and husband verbalizing ways to impliment techniques at home.     Vision       Perception     Praxis      Cognition Arousal/Alertness: Awake/alert Behavior During Therapy: WFL for tasks assessed/performed Overall Cognitive Status: Impaired/Different from baseline Area of Impairment: Problem solving                             Problem Solving: Requires verbal cues General Comments: Pt presenting with increased cognition compared to prior session. Requiring Min cues for problem solving. Demosntratingi ncreased memory recalling tasks and educational information.         Exercises     Shoulder Instructions  General Comments Husband present throughout session    Pertinent Vitals/ Pain       Pain Assessment: No/denies pain  Home Living                                          Prior Functioning/Environment              Frequency  Min 2X/week        Progress Toward Goals  OT Goals(current goals  can now be found in the care plan section)  Progress towards OT goals: Progressing toward goals  Acute Rehab OT Goals Patient Stated Goal: Return to walking with dog OT Goal Formulation: With patient Time For Goal Achievement: 11/22/17 Potential to Achieve Goals: Good ADL Goals Pt Will Perform Grooming: with modified independence;standing Pt Will Perform Lower Body Dressing: with modified independence;sit to/from stand Pt Will Transfer to Toilet: with modified independence;ambulating;regular height toilet Additional ADL Goal #1: Pt will verbalize three fall prevention techniques with 1-2 VCs Additional ADL Goal #2: Pt will perform four ADLs with 1-2 cues for memory  Plan Discharge plan remains appropriate    Co-evaluation                 AM-PAC PT "6 Clicks" Daily Activity     Outcome Measure   Help from another person eating meals?: None Help from another person taking care of personal grooming?: A Little Help from another person toileting, which includes using toliet, bedpan, or urinal?: A Little Help from another person bathing (including washing, rinsing, drying)?: A Little Help from another person to put on and taking off regular upper body clothing?: None Help from another person to put on and taking off regular lower body clothing?: A Little 6 Click Score: 20    End of Session Equipment Utilized During Treatment: Gait belt;Other (comment)(cane)  OT Visit Diagnosis: Unsteadiness on feet (R26.81);Other abnormalities of gait and mobility (R26.89);Muscle weakness (generalized) (M62.81);Other symptoms and signs involving cognitive function   Activity Tolerance Patient tolerated treatment well   Patient Left with call bell/phone within reach;in bed;with family/visitor present   Nurse Communication Mobility status        Time: 6153-7943 OT Time Calculation (min): 32 min  Charges: OT General Charges $OT Visit: 1 Visit OT Treatments $Self Care/Home Management :  23-37 mins  [place], OTR/L Acute Rehab Pager: [phone] Office: [place] 11/10/2017, 5:23 PM

## 2017-11-11 DIAGNOSIS — I10 Essential (primary) hypertension: Secondary | ICD-10-CM

## 2017-11-11 DIAGNOSIS — I480 Paroxysmal atrial fibrillation: Secondary | ICD-10-CM

## 2017-11-11 DIAGNOSIS — R55 Syncope and collapse: Secondary | ICD-10-CM | POA: Diagnosis not present

## 2017-11-11 LAB — CULTURE, BLOOD (ROUTINE X 2)
CULTURE: NO GROWTH
CULTURE: NO GROWTH
SPECIAL REQUESTS: ADEQUATE

## 2017-11-11 MED ORDER — LOSARTAN POTASSIUM 50 MG PO TABS: 50.0000 mg | ORAL_TABLET | Freq: Every day | ORAL | 0 refills | Status: DC

## 2017-11-11 MED ORDER — DILTIAZEM HCL ER COATED BEADS 120 MG PO CP24
120.0000 mg | ORAL_CAPSULE | Freq: Every day | ORAL | Status: DC
  Administered 2017-11-11: 120 mg via ORAL
  Filled 2017-11-11: qty 1

## 2017-11-11 NOTE — Consult Note (Signed)
Reason for Consult: PEA arrest Referring Physician: Patient request/Trial hospitalist  Cynthia [name] is an 77 y.o. female.  HPI:   77 y/o Caucasian female with paroxysmal atrial fibrillation, normal coronary arteries cath 11/2016, hypertension, h/o GI bleed with no bleeding source identified on EGD, h/o melanoma.   Patient was admitted to ICU after a PEA arrest on 11/06/2017. Patient recollects that she had breakasft that morning, took her medications, and was standing at the kitchen table when she felt lightheaded, "floors changing colors". She remembers asking her husband to call 911, before losing consciousness. She was brought to the ED by EMS, successfully resuscitated with ACLS. She was found to be bradycardic in 40s, responding to IV atropine, and hypotensive- requiring IV fluids and vasopressors. CTA was negative for PE, showed mildly dilated ascending aorta measuring 4.2 cm. She had reportedly been worked up for GI bleed with no source identified on EGD. Her HB was down from 9.8 on 10/29/17 to 7.3 on arrival on 11/06/17. Hb is now at 11 after PRBC transfusion.  During her hospitalization, she has been in sinus rhythm/tachycardia, occasional PAC's, and occasional Afib w/RVR on 07/29. She has not had any SVT, VT,  high grade AV block. Echocardiogram showed normal LVEF, no significant valvular abnormalities, PASP 32 mmHg.  Past Medical History:  Diagnosis Date  . A-fib ([place])   . Anemia   . Arthritis    "joints" (11/10/2017)  . GERD (gastroesophageal reflux disease)   . Hearing loss   . History of blood transfusion 10/2017  . Hypertension   . Hyperthyroidism   . Melanoma ([place])    "cut off my back"  . OSA on CPAP   . Sinus headache     Past Surgical History:  Procedure Laterality Date  . ABDOMINAL HYSTERECTOMY     "partial"  . APPENDECTOMY    . CRANIECTOMY FOR EXCISION OF ACOUSTIC NEUROMA    . ESOPHAGOGASTRODUODENOSCOPY (EGD) WITH PROPOFOL N/A 10/28/2017   Procedure:  ESOPHAGOGASTRODUODENOSCOPY (EGD) WITH PROPOFOL;  Surgeon: Clarene Essex, MD;  Location: [facility];  Service: Endoscopy;  Laterality: N/A;  . KNEE ARTHROSCOPY Left   . LEFT HEART CATH AND CORONARY ANGIOGRAPHY N/A 11/24/2016   Procedure: LEFT HEART CATH AND CORONARY ANGIOGRAPHY;  Surgeon: Jettie Booze, MD;  Location: [facility];  Service: Cardiovascular;  Laterality: N/A;  . MELANOMA EXCISION     "off my back"  . NASAL SINUS SURGERY    . TONSILLECTOMY      Family History  Problem Relation Age of Onset  . Congestive Heart Failure Mother   . Heart disease Father        History of heart attacks at a later age.      Social History:  reports that she quit smoking about 39 years ago. Her smoking use included cigarettes. She quit after 2.00 years of use. She has never used smokeless tobacco. She reports that she drinks alcohol. She reports that she does not use drugs.  Allergies: No Known Allergies  Medications: I have reviewed the patient's current medications.  Results for orders placed or performed during the hospital encounter of 11/06/17 (from the past 48 hour(s))  Comprehensive metabolic panel     Status: Abnormal   Collection Time: 11/10/17  3:21 AM  Result Value Ref Range   Sodium 141 135 - 145 mmol/L   Potassium 4.3 3.5 - 5.1 mmol/L    Comment: DELTA CHECK NOTED   Chloride 107 98 - 111 mmol/L   CO2  25 22 - 32 mmol/L   Glucose, Bld 125 (H) 70 - 99 mg/dL   BUN 18 8 - 23 mg/dL   Creatinine, Ser 0.96 0.44 - 1.00 mg/dL   Calcium 9.2 8.9 - 10.3 mg/dL   Total Protein 5.9 (L) 6.5 - 8.1 g/dL   Albumin 3.3 (L) 3.5 - 5.0 g/dL   AST 22 15 - 41 U/L   ALT 21 0 - 44 U/L   Alkaline Phosphatase 42 38 - 126 U/L   Total Bilirubin 0.6 0.3 - 1.2 mg/dL   GFR calc non Af Amer 56 (L) >60 mL/min   GFR calc Af Amer >60 >60 mL/min    Comment: (NOTE) The eGFR has been calculated using the CKD EPI equation. This calculation has not been validated in all clinical situations. eGFR's  persistently <60 mL/min signify possible Chronic Kidney Disease.    Anion gap 9 5 - 15    Comment: Performed at [facility] [address], [place], [place] [address]  CBC     Status: Abnormal   Collection Time: 11/10/17  3:21 AM  Result Value Ref Range   WBC 8.7 4.0 - 10.5 K/uL   RBC 3.60 (L) 3.87 - 5.11 MIL/uL   Hemoglobin 11.2 (L) 12.0 - 15.0 g/dL   HCT 34.6 (L) 36.0 - 46.0 %   MCV 96.1 78.0 - 100.0 fL   MCH 31.1 26.0 - 34.0 pg   MCHC 32.4 30.0 - 36.0 g/dL   RDW 13.1 11.5 - 15.5 %   Platelets 237 150 - 400 K/uL    Comment: Performed at [facility], [place] [address], [place], [place] [address]    No results found.  Review of Systems  Constitutional: Negative for chills and fever.  HENT: Negative.   Eyes: Negative.   Cardiovascular: Negative for chest pain, claudication and leg swelling.  Gastrointestinal: Positive for melena (Chronic). Negative for nausea and vomiting.  Genitourinary: Negative.   Skin: Negative.   Neurological: Positive for loss of consciousness (On admission. Now back to baseline). Negative for dizziness.  Psychiatric/Behavioral: Negative.   All other systems reviewed and are negative.  Blood pressure (!) 143/77, pulse 84, temperature 98.3 F (36.8 C), temperature source Oral, resp. rate (!) 24, height '5\' 5"'  (1.651 m), weight 66.3 kg (146 lb 3.2 oz), SpO2 93 %. Physical Exam  Constitutional: She is oriented to person, place, and time.  HENT:  Head: Normocephalic and atraumatic.  Neck: Normal range of motion. No JVD present.  Cardiovascular: Normal rate and normal heart sounds.  No murmur heard. B/l DP, PT 1+, B?l femoral 2+  Respiratory: Effort normal and breath sounds normal. She has no wheezes. She has no rales.  GI: Soft. Bowel sounds are normal. There is no tenderness. There is no rebound.  Musculoskeletal: She exhibits no edema.  Neurological: She is oriented to person, place, and time.  Skin: Skin is warm and dry.  Psychiatric:  She has a normal mood and affect.     Cardiac studies: Cath 11/24/2016:  The left ventricular systolic function is normal.  LV end diastolic pressure is low.  The left ventricular ejection fraction is 55-65% by visual estimate.  There is no aortic valve stenosis.  Tortuous LAD. No significant atherosclerosis.    Echocardiogram 11/09/2017: Study Conclusions  - Left ventricle: The cavity size was normal. Systolic function was   normal. The estimated ejection fraction was in the range of 60%   to 65%. Wall motion was  normal; there were no regional wall   motion abnormalities. The study is not technically sufficient to   allow evaluation of LV diastolic function. - Pulmonary arteries: PA peak pressure: 32 mm Hg (S).  Impressions:  - The right ventricular systolic pressure was increased consistent   with mild pulmonary hypertension.  EKG 11/08/2017: Afib w/RVR. Leftward axis. LAFB. Nonspecific ST-T changes  EKG 11/06/2017: Sinus bradycardia 64 bpm. LAFB.  Assessment/Recommendations:  77 y/o Caucasian female with PEA arrest  PEA arrest: Agree with current management No PE on CTA. Echocardiogram with no structural cardiac abnormality to explain PEA. No arrhythmogenic etiology identified through 5 days in the hospital. Anemia on admission, now corrected. Patient was hypotensive on arrival, now corrected. Tolerating losartan 50 mg and diltiazem 30 mg q6hr without any hypotensive episodes. I would continue the same (on 100 mg losartan at home). She has had at least one prior pre syncopal episode. I agree that most likely etiology was orthostatic hypotension, probably coupled with her anemia. Continue workup for etiology for slow GI bleed. Will arrange outpatient event monitor.  Paroxysmal Afib: Currently in sinus rhythm. CHA2DS2VASc score 4. Continue Xarelto  Mildly dilated ascending aorta: Not appreciated on echocardiogram. Will need annual CTA for serial monitoring.  GI  bleed: Chronic. Follow up with GI  Cardiology signing off. Will arrange outpatient follow up for the patient.  Kyah Buesing J Deontay Ladnier 11/11/2017, 8:17 AM   [place], MD [facility] Pager: [phone] Office: [phone] If no answer Cell [phone]

## 2017-11-11 NOTE — Progress Notes (Signed)
Discharge order received. IV and Telemetry removed. Discharge instructions reviewed with and given to patient and husband. Patient and husband taken to private vehicle via volunteer services.

## 2017-11-11 NOTE — Discharge Summary (Addendum)
Physician Discharge Summary  Cynthia [name] FAO:[id number] DOB: 1940-05-01 DOA: 11/06/2017  PCP: Deland Pretty, MD  Admit date: 11/06/2017 Discharge date: 11/11/2017  Admitted From: Home  Disposition:  Home   Recommendations for Outpatient Follow-up and new medication changes:  1. Follow up with Dr. Shelia Media in 7 days 2. Losartan dose was decreased to 50 mg daily and aldactone was discontinued. Please follow blood pressure as outpatient with intention to resume 100 mg daily if tolerated.  3. Continue to hold on rivaroxaban, until outpatient follow up with gastroenterology for colonoscopy.   Home Health: yes   Equipment/Devices: walker    Discharge Condition: stable  CODE STATUS: full  Diet recommendation: heart healthy   Brief/Interim Summary: 77 year old female who presented after a presumptive pulseless electrical activity cardiac arrest.  She does have significant past medical history for atrial fibrillation, recent gastrointestinal bleed, and hypertension.  On the day of admission she stood up from a chair, had severe vertigo and visual disturbances, then lost her consciousness.  EMS was called, immediately arrived and determineed patient needed CPR, which was performed for 2 minutes, then recovering spontaneous circulation, post event she had significant bradycardia, 40 bpm, responded well to atropine.  In the emergency department she was hypotensive, refractory to IV fluids, required vasopressors. On her initial vital signs her blood pressure 77/41, heart rate 56, temperature 97, respiratory 16, oxygenation 99%.  Patient was awake and alert, lungs clear to auscultation bilaterally, heart S1-S2 present irregularly irregular, abdomen soft, nontender, no lower extremity edema.  Sodium 139 potassium 4.1, chloride 107, bicarb 24, glucose 132, BUN 10, creatinine 1.14, magnesium 2.0, white count 5.0, hemoglobin 7.3, hematocrit 33.6, platelets 191.  Troponin I 0.0, EKG was normal sinus with normal axis  intervals.  No significant ST segment changes or T wave abnormalities.  Chest x-ray had no infiltrates.  Patient was admitted to the intensive care unit with the working diagnosis of refractive hypotension.  1.  Vasovagal syncope, questionable PEA cardiac arrest.  Patient was admitted to the intensive care unit, she required vasopressors for hypotension.  She responded well to medical therapy, she was successfully weaned off vasoactive agents.  Losartan and diltiazem were resumed with good toleration.  Her clinical presentation is highly suggestive of a vasovagal syncope with persistent bradycardia, note patient was on diltiazem.  Further work-up with echocardiography showed normal LV systolic function with ejection fraction 60 to 65%, no wall motion abnoramalities.  Peak PA pressure was 32.  Recent cardiac catheterization August 2018, no significant coronary atherosclerosis.   2.  Paroxysmal atrial fibrillation.  Diltiazem was resumed for rate control, will continue to hold rivaroxaban until outpatient GI follow-up.   3.  Hypokalemia.  Electrolytes were corrected, discharge K 4,3 with serum bicarbonate at 25, with serum cr at 0,96.   4. HTN. Losartan was resumed at a lower dose of 50 mg daily and continue diltiazem for rate control atrial fibrillation. Will hold on aldactone for now. Plan to resume losartan up to 100 mg as outpatient if blood pressure continue to be stable and no further orthostatic events.   5. Hypothyroid. Continue hormone replacement.   6. Depression. Continue duloxetine and melatonin.   Discharge Diagnoses:  Active Problems:   GI bleed    Discharge Instructions   Allergies as of 11/11/2017   No Known Allergies     Medication List    STOP taking these medications   nitroGLYCERIN 0.4 MG/SPRAY spray Commonly known as:  NITROLINGUAL   rivaroxaban 20 MG  Tabs tablet Commonly known as:  XARELTO   spironolactone 25 MG tablet Commonly known as:  ALDACTONE      TAKE these medications   atorvastatin 10 MG tablet Commonly known as:  LIPITOR TAKE 1 TABLET BY MOUTH EVERY DAY AT 6 PM   CALCIUM PO Take 600 mg by mouth 2 (two) times daily.   cetirizine 10 MG tablet Commonly known as:  ZYRTEC Take 10 mg by mouth daily.   cholecalciferol 1000 units tablet Commonly known as:  VITAMIN D Take 1,000 Units by mouth daily.   CRANBERRY CONCENTRATE PO Take 650 mg by mouth daily.   cycloSPORINE 0.05 % ophthalmic emulsion Commonly known as:  RESTASIS Place 1 drop into both eyes 2 (two) times daily.   diltiazem 120 MG 24 hr capsule Commonly known as:  CARDIZEM CD Take 1 capsule (120 mg total) by mouth daily.   DULoxetine 30 MG capsule Commonly known as:  CYMBALTA TAKE 1 CAPSULE (30 MG TOTAL) BY MOUTH 2 (TWO) TIMES DAILY.   GLUCOSAMINE-CHONDROITIN PO Take 2 tablets by mouth daily with lunch. Glucosamine 1500mg  Chondroitin 1200mg    LIPOIC ACID PO Take 600 mg by mouth 2 (two) times daily.   losartan 50 MG tablet Commonly known as:  COZAAR Take 1 tablet (50 mg total) by mouth daily. Start taking on:  11/12/2017 What changed:    medication strength  how much to take   Lutein-Zeaxanthin 25-5 MG Caps Take 1 tablet by mouth daily.   Melatonin 5 MG Tabs Take 5 mg by mouth at bedtime.   multivitamin with minerals Tabs tablet Take 1 tablet by mouth daily.   Omega-3 1000 MG Caps Take 1,000 mg by mouth daily.   pantoprazole 40 MG tablet Commonly known as:  PROTONIX Take 40 mg by mouth daily before breakfast.   ranitidine 150 MG tablet Commonly known as:  ZANTAC Take 1 tablet (150 mg total) by mouth 2 (two) times daily.   thyroid 60 MG tablet Commonly known as:  ARMOUR Take 60 mg by mouth daily before breakfast.   tiZANidine 4 MG tablet Commonly known as:  ZANAFLEX TAKE 1 TABLET BY MOUTH THREE TIMES A DAY AS NEEDED What changed:    how much to take  how to take this  when to take this   URIBEL PO Take 36 mg by mouth daily.    vitamin C 500 MG tablet Commonly known as:  ASCORBIC ACID Take 500 mg by mouth daily.       No Known Allergies  Consultations:  Critical Care   Procedures/Studies: Ct Head Wo Contrast  Result Date: 11/06/2017 CLINICAL DATA:  77 year old female with altered level of consciousness. CPR. EXAM: CT HEAD WITHOUT CONTRAST TECHNIQUE: Contiguous axial images were obtained from the base of the skull through the vertex without intravenous contrast. COMPARISON:  04/16/2014 CT and prior studies FINDINGS: Brain: No evidence of acute infarction, hemorrhage, hydrocephalus, extra-axial collection or mass lesion/mass effect. Probable mild chronic small-vessel white matter ischemic changes again noted. Mild ventriculomegaly again identified. Vascular: Mild intracranial atherosclerotic calcifications identified. Skull: No acute abnormality. LEFT temporal/occipital craniectomy changes again noted. Sinuses/Orbits: Chronic changes of the LEFT maxillary and RIGHT sphenoid sinuses noted. No acute abnormality. Other: None IMPRESSION: 1. No evidence of acute intracranial abnormality. Probable mild chronic small-vessel white matter ischemic changes. Electronically Signed   By: Margarette Canada M.D.   On: 11/06/2017 13:19   Ct Abdomen Pelvis W Contrast  Result Date: 10/28/2017 CLINICAL DATA:  Gastrointestinal bleed EXAM: CT ABDOMEN  AND PELVIS WITH CONTRAST TECHNIQUE: Multidetector CT imaging of the abdomen and pelvis was performed using the standard protocol following bolus administration of intravenous contrast. CONTRAST:  126mL ISOVUE-300 IOPAMIDOL (ISOVUE-300) INJECTION 61%, 68mL ISOVUE-300 IOPAMIDOL (ISOVUE-300) INJECTION 61% COMPARISON:  10/17/2014 CT scan FINDINGS: Lower chest: Subsegmental atelectasis in the posterior basal segments of both lower lobes. Mitral valve calcification. Descending thoracic aortic atherosclerotic calcification. Hepatobiliary: Unremarkable Pancreas: Unremarkable Spleen: Unremarkable  Adrenals/Urinary Tract: Unremarkable Stomach/Bowel: Prominent stool throughout the colon favors constipation. Appendix surgically absent. Vascular/Lymphatic: Aortoiliac atherosclerotic vascular disease. Aortocaval node 0.7 cm in short axis on image 41/2. No overt pathologic adenopathy identified. Reproductive: Uterus absent.  Adnexa unremarkable. Other: No supplemental non-categorized findings. Musculoskeletal: Mildly low position of the anorectal junction suggesting pelvic floor laxity. 3 mm of grade 1 degenerative retrolisthesis at L2-3 with 5 mm grade 1 anterolisthesis at L4-5, accompanied by lumbar spondylosis and degenerative disc disease. IMPRESSION: 1. A specific cause for the patient's reported gastrointestinal bleed is not identified. 2.  Prominent stool throughout the colon favors constipation. 3. Mild pelvic floor laxity. 4. Aortic Atherosclerosis (ICD10-I70.0). Mitral valve calcification. 5. Lumbar spondylosis and degenerative disc disease. Electronically Signed   By: Van Clines M.D.   On: 10/28/2017 18:54   Dg Chest Port 1 View  Result Date: 11/09/2017 CLINICAL DATA:  Acute respiratory failure with hypoxia, weakness, history hypertension, atrial fibrillation, melanoma EXAM: PORTABLE CHEST 1 VIEW COMPARISON:  Portable exam 0542 hours compared to 11/06/2017 FINDINGS: Normal heart size, mediastinal contours, and pulmonary vascularity. Atherosclerotic calcifications aorta. Subsegmental atelectasis at RIGHT base. Lungs otherwise clear with central peribronchial thickening noted. No pleural effusion or pneumothorax. Bones appear demineralized. IMPRESSION: Bronchitic changes with subsegmental atelectasis RIGHT base. Electronically Signed   By: Lavonia Dana M.D.   On: 11/09/2017 09:13   Dg Chest Portable 1 View  Result Date: 11/06/2017 CLINICAL DATA:  Cardiac arrest. EXAM: PORTABLE CHEST 1 VIEW COMPARISON:  01/29/2017 prior radiographs FINDINGS: Cardiomediastinal silhouette is unremarkable and  unchanged. There is no evidence of focal airspace disease, pulmonary edema, suspicious pulmonary nodule/mass, pleural effusion, or pneumothorax. No acute bony abnormalities are identified. IMPRESSION: No active disease. Electronically Signed   By: Margarette Canada M.D.   On: 11/06/2017 12:11   Ct Angio Chest/abd/pel For Dissection W And/or Wo Contrast  Result Date: 11/06/2017 CLINICAL DATA:  Unresponsive and hypotensive. EXAM: CT ANGIOGRAPHY CHEST, ABDOMEN AND PELVIS TECHNIQUE: Multidetector CT imaging through the chest, abdomen and pelvis was performed using the standard protocol during bolus administration of intravenous contrast. Multiplanar reconstructed images and MIPs were obtained and reviewed to evaluate the vascular anatomy. CONTRAST:  172mL ISOVUE-370 IOPAMIDOL (ISOVUE-370) INJECTION 76% COMPARISON:  Prior CT of the abdomen and pelvis with contrast on 10/28/2017 FINDINGS: CTA CHEST FINDINGS Cardiovascular: Ascending thoracic aorta shows mild dilatation measuring 4.1-4.2 cm in greatest diameter. The aortic root, arch and descending thoracic aorta are of normal caliber. There is mild scattered calcified plaque. No dissection. Proximal great vessels are tortuous but shows no evidence of significant obstructive disease. Central pulmonary arteries are normal in caliber. No evidence of pulmonary embolism with satisfactory opacification of the pulmonary arteries. The heart size is at the upper limits of normal. No pericardial fluid. No significant calcified plaque in the coronary arteries with a single tiny calcified plaque present at the origin of a diagonal branch off of the LAD. Mediastinum/Nodes: No enlarged mediastinal, hilar, or axillary lymph nodes. Thyroid gland, trachea, and esophagus demonstrate no significant findings. Lungs/Pleura: Mild bibasilar atelectasis/scarring. There is no evidence of  pulmonary edema, consolidation, pneumothorax, nodule or pleural fluid. Musculoskeletal: No chest wall  abnormality. No acute or significant osseous findings. Review of the MIP images confirms the above findings. CTA ABDOMEN AND PELVIS FINDINGS VASCULAR Aorta: The abdominal aorta is normal in caliber and shows no evidence of aneurysm or dissection. Celiac: Normally patent.  Normal distal branching anatomy. SMA: Normally patent. Renals: Two separate renal arteries on the right and two separate renal arteries on the left. No evidence of renal artery stenosis. There is some irregularity in contour of the mid to distal aspect of the main upper right renal artery consistent with fibromuscular dysplasia. This vessel also demonstrates a small focal aneurysm at a branch point near the superior renal hilum measuring approximately 9 x 10 x 10 mm and demonstrating rim of internal mural thrombus. IMA: Normally patent. Inflow: Bilateral iliac and common femoral arteries show normal patency. No aneurysm or significant obstructive disease. Review of the MIP images confirms the above findings. NON-VASCULAR Hepatobiliary: No focal liver abnormality is seen. No gallstones, gallbladder wall thickening, or biliary dilatation. Pancreas: Unremarkable. No pancreatic ductal dilatation or surrounding inflammatory changes. Spleen: Normal in size without focal abnormality. Adrenals/Urinary Tract: Adrenal glands are unremarkable. Kidneys are normal, without renal calculi, focal lesion, or hydronephrosis. Bladder is unremarkable. Stomach/Bowel: Bowel shows no evidence of obstruction. No obvious inflammatory process. Moderate fecal material throughout the colon. Mild amount of dependent higher density material is present in the stomach confirmed by the unenhanced portion of the study. The stomach is not distended. No free air. Lymphatic: No enlarged lymph nodes identified in the abdomen or pelvis. Reproductive: Status post hysterectomy. No adnexal masses. Other: No abdominal wall hernia or abnormality. No abscess or ascites. Musculoskeletal:  Degenerative disc disease present of the lumbar spine. Review of the MIP images confirms the above findings. IMPRESSION: 1. No acute aortic pathology identified. No evidence of aortic dissection. 2. No evidence of pulmonary embolism. 3. Mild dilatation of the ascending thoracic aorta measuring 4.1-4.2 cm in greatest diameter. Recommend annual imaging followup by CTA or MRA. This recommendation follows 2010 ACCF/AHA/AATS/ACR/ASA/SCA/SCAI/SIR/STS/SVM Guidelines for the Diagnosis and Management of Patients with Thoracic Aortic Disease. Circulation. 2010; 121: U235-T614 4. Evidence of fibromuscular dysplasia involving the dominant of 2 right renal arteries. This vessel also shows a focal small aneurysm in its distal segment at a bifurcation point measuring 10 mm in diameter. Electronically Signed   By: Aletta Edouard M.D.   On: 11/06/2017 13:41       Subjective: Patient is feeling well, no nausea or vomiting, no chest pain.   Discharge Exam: Vitals:   11/11/17 0546 11/11/17 0655  BP: (!) 144/79 (!) 143/77  Pulse: 84   Resp:    Temp: 98.3 F (36.8 C)   SpO2: 93%    Vitals:   11/10/17 2046 11/11/17 0032 11/11/17 0546 11/11/17 0655  BP: 123/85 137/67 (!) 144/79 (!) 143/77  Pulse: 93  84   Resp:      Temp: 97.8 F (36.6 C)  98.3 F (36.8 C)   TempSrc: Oral  Oral   SpO2: 98%  93%   Weight:   66.3 kg (146 lb 3.2 oz)   Height:        General: Not in pain or dyspnea Neurology: Awake and alert, non focal  E ENT: no pallor, no icterus, oral mucosa moist Cardiovascular: No JVD. S1-S2 present, rhythmic, no gallops, rubs, or murmurs. No lower extremity edema. Pulmonary: vesicular breath sounds bilaterally, adequate air movement, no wheezing, rhonchi  or rales. Gastrointestinal. Abdomen with no organomegaly, non tender, no rebound or guarding Skin. No rashes Musculoskeletal: no joint deformities   The results of significant diagnostics from this hospitalization (including imaging,  microbiology, ancillary and laboratory) are listed below for reference.     Microbiology: Recent Results (from the past 240 hour(s))  Blood Culture (routine x 2)     Status: None (Preliminary result)   Collection Time: 11/06/17 11:36 AM  Result Value Ref Range Status   Specimen Description BLOOD RIGHT ANTECUBITAL  Final   Special Requests   Final    BOTTLES DRAWN AEROBIC AND ANAEROBIC Blood Culture results may not be optimal due to an excessive volume of blood received in culture bottles   Culture   Final    NO GROWTH 4 DAYS Performed at [facility] [address] [place], [place] [address]    Report Status PENDING  Incomplete  Blood Culture (routine x 2)     Status: None (Preliminary result)   Collection Time: 11/06/17 11:39 AM  Result Value Ref Range Status   Specimen Description BLOOD RIGHT FOREARM  Final   Special Requests   Final    BOTTLES DRAWN AEROBIC AND ANAEROBIC Blood Culture adequate volume   Culture   Final    NO GROWTH 4 DAYS Performed at [facility], [place] [address], [place], [place] [address]    Report Status PENDING  Incomplete  MRSA PCR Screening     Status: None   Collection Time: 11/06/17  3:46 PM  Result Value Ref Range Status   MRSA by PCR NEGATIVE NEGATIVE Final    Comment:        The GeneXpert MRSA Assay (FDA approved for NASAL specimens only), is one component of a comprehensive MRSA colonization surveillance program. It is not intended to diagnose MRSA infection nor to guide or monitor treatment for MRSA infections. Performed at [facility], [place] [address], [place], [place] [address]      Labs: BNP (last 3 results) No results for input(s): BNP in the last 8760 hours. Basic Metabolic Panel: Recent Labs  Lab 11/06/17 1117 11/07/17 0423 11/08/17 0706 11/08/17 2347 11/09/17 0007 11/10/17 0321  NA 139 141 141 141 141 141  K 4.1 3.7 3.4* 3.6 3.4* 4.3  CL 107 111 104 106 105 107  CO2 24 22 26 27 27 25   GLUCOSE 132*  86 123* 139* 141* 125*  BUN 19 10 6* 13 12 18   CREATININE 1.14* 0.84 0.76 0.82 0.82 0.96  CALCIUM 8.0* 8.3* 9.5 9.4 9.3 9.2  MG 2.0 2.0  --  1.9  --   --   PHOS  --  2.6  --   --   --   --    Liver Function Tests: Recent Labs  Lab 11/06/17 1117 11/09/17 0007 11/10/17 0321  AST 19 27 22   ALT 15 21 21   ALKPHOS 35* 50 42  BILITOT 0.6 0.9 0.6  PROT 4.9* 6.2* 5.9*  ALBUMIN 2.9* 3.5 3.3*   No results for input(s): LIPASE, AMYLASE in the last 168 hours. No results for input(s): AMMONIA in the last 168 hours. CBC: Recent Labs  Lab 11/06/17 1117 11/06/17 1807 11/07/17 0423 11/08/17 0706 11/09/17 0007 11/10/17 0321  WBC 5.0 6.6 7.1 9.1 9.0 8.7  NEUTROABS 2.9  --   --   --   --   --   HGB 7.3* 8.9* 8.6* 11.2* 11.5* 11.2*  HCT 23.0* 28.0* 26.6* 34.4*  35.1* 34.6*  MCV 99.1 97.6 96.4 94.0 94.1 96.1  PLT 191 223 205 236 239 237   Cardiac Enzymes: No results for input(s): CKTOTAL, CKMB, CKMBINDEX, TROPONINI in the last 168 hours. BNP: Invalid input(s): POCBNP CBG: No results for input(s): GLUCAP in the last 168 hours. D-Dimer No results for input(s): DDIMER in the last 72 hours. Hgb A1c No results for input(s): HGBA1C in the last 72 hours. Lipid Profile No results for input(s): CHOL, HDL, LDLCALC, TRIG, CHOLHDL, LDLDIRECT in the last 72 hours. Thyroid function studies No results for input(s): TSH, T4TOTAL, T3FREE, THYROIDAB in the last 72 hours.  Invalid input(s): FREET3 Anemia work up No results for input(s): VITAMINB12, FOLATE, FERRITIN, TIBC, IRON, RETICCTPCT in the last 72 hours. Urinalysis    Component Value Date/Time   COLORURINE YELLOW 11/06/2017 1330   APPEARANCEUR CLOUDY (A) 11/06/2017 1330   LABSPEC 1.016 11/06/2017 1330   PHURINE 7.0 11/06/2017 1330   GLUCOSEU NEGATIVE 11/06/2017 1330   HGBUR MODERATE (A) 11/06/2017 1330   BILIRUBINUR NEGATIVE 11/06/2017 1330   KETONESUR NEGATIVE 11/06/2017 1330   PROTEINUR NEGATIVE 11/06/2017 1330   NITRITE NEGATIVE  11/06/2017 1330   LEUKOCYTESUR SMALL (A) 11/06/2017 1330   Sepsis Labs Invalid input(s): PROCALCITONIN,  WBC,  LACTICIDVEN Microbiology Recent Results (from the past 240 hour(s))  Blood Culture (routine x 2)     Status: None (Preliminary result)   Collection Time: 11/06/17 11:36 AM  Result Value Ref Range Status   Specimen Description BLOOD RIGHT ANTECUBITAL  Final   Special Requests   Final    BOTTLES DRAWN AEROBIC AND ANAEROBIC Blood Culture results may not be optimal due to an excessive volume of blood received in culture bottles   Culture   Final    NO GROWTH 4 DAYS Performed at [facility], [place] [address] [place], [place] [address]    Report Status PENDING  Incomplete  Blood Culture (routine x 2)     Status: None (Preliminary result)   Collection Time: 11/06/17 11:39 AM  Result Value Ref Range Status   Specimen Description BLOOD RIGHT FOREARM  Final   Special Requests   Final    BOTTLES DRAWN AEROBIC AND ANAEROBIC Blood Culture adequate volume   Culture   Final    NO GROWTH 4 DAYS Performed at [facility], [place] [address] [place], [place] [address]    Report Status PENDING  Incomplete  MRSA PCR Screening     Status: None   Collection Time: 11/06/17  3:46 PM  Result Value Ref Range Status   MRSA by PCR NEGATIVE NEGATIVE Final    Comment:        The GeneXpert MRSA Assay (FDA approved for NASAL specimens only), is one component of a comprehensive MRSA colonization surveillance program. It is not intended to diagnose MRSA infection nor to guide or monitor treatment for MRSA infections. Performed at [facility], [place] [address], [place], [place] [address]      Time coordinating discharge: 45 minutes  SIGNED:   Tawni Millers, MD  [facility] 11/11/2017, 9:48 AM Pager [phone]  If 7PM-7AM, please contact night-coverage [email or web address] Password [email or web address]

## 2017-11-11 NOTE — Care Management Note (Signed)
Case Management Note  Patient Details  Name: Cynthia [name] MRN: [id number] Date of Birth: 06/11/1940  Subjective/Objective:  Patient is an ICU transfer, who presented after collapsing at home with a brief PEA arrest. Patient lives at home with spouse, independent with ADLs, ambulating with a cane. PCP: Dr. Deland Pretty. Patient indicated being [place] with a history of Afib.                 Action/Plan: CM met with patient to discuss hospital transitional plans for Outpatient PT vs. HH. PT recommended outpatient PT; OT recommended HHOT. Patient/spouse agreeable to HHPT services with [facility]. Patient verified demographics/insurance carrier with CM, and informed patient/spouse of a HHPT SOC within 24-48hrs. CM contacted [facility] to initiate the referral, with patient's AVS updated. CM will continue to follow for hospital transitional needs.   Expected Discharge Date:  11/11/17               Expected Discharge Plan:  Home/Self Care(Home with outpatient PT)  In-House Referral:  NA  Discharge planning Services  CM Consult  Post Acute Care Choice:  Home Health Choice offered to:  Patient  DME Arranged:  N/A DME Agency:  NA  HH Arranged:  PT HH Agency:  [facility]  Status of Service:  Completed, signed off  If discussed at [place] of Stay Meetings, dates discussed:    Additional Comments:  Georgeanna Lea, RN 11/11/2017, 12:08 PM

## 2017-11-11 NOTE — Discharge Instructions (Signed)

## 2017-11-15 DIAGNOSIS — I10 Essential (primary) hypertension: Secondary | ICD-10-CM | POA: Diagnosis not present

## 2017-11-15 DIAGNOSIS — I4891 Unspecified atrial fibrillation: Secondary | ICD-10-CM | POA: Diagnosis not present

## 2017-11-15 DIAGNOSIS — M1990 Unspecified osteoarthritis, unspecified site: Secondary | ICD-10-CM | POA: Diagnosis not present

## 2017-11-15 DIAGNOSIS — K922 Gastrointestinal hemorrhage, unspecified: Secondary | ICD-10-CM | POA: Diagnosis not present

## 2017-11-15 DIAGNOSIS — N3000 Acute cystitis without hematuria: Secondary | ICD-10-CM | POA: Diagnosis not present

## 2017-11-15 DIAGNOSIS — G4733 Obstructive sleep apnea (adult) (pediatric): Secondary | ICD-10-CM | POA: Diagnosis not present

## 2017-11-15 DIAGNOSIS — E039 Hypothyroidism, unspecified: Secondary | ICD-10-CM | POA: Diagnosis not present

## 2017-11-15 DIAGNOSIS — R413 Other amnesia: Secondary | ICD-10-CM | POA: Diagnosis not present

## 2017-11-15 DIAGNOSIS — F329 Major depressive disorder, single episode, unspecified: Secondary | ICD-10-CM | POA: Diagnosis not present

## 2017-11-15 DIAGNOSIS — K219 Gastro-esophageal reflux disease without esophagitis: Secondary | ICD-10-CM | POA: Diagnosis not present

## 2017-11-15 DIAGNOSIS — D649 Anemia, unspecified: Secondary | ICD-10-CM | POA: Diagnosis not present

## 2017-11-16 ENCOUNTER — Ambulatory Visit: Payer: Medicare PPO | Admitting: Neurology

## 2017-11-16 DIAGNOSIS — E039 Hypothyroidism, unspecified: Secondary | ICD-10-CM | POA: Diagnosis not present

## 2017-11-16 DIAGNOSIS — I10 Essential (primary) hypertension: Secondary | ICD-10-CM | POA: Diagnosis not present

## 2017-11-16 DIAGNOSIS — K219 Gastro-esophageal reflux disease without esophagitis: Secondary | ICD-10-CM | POA: Diagnosis not present

## 2017-11-16 DIAGNOSIS — K922 Gastrointestinal hemorrhage, unspecified: Secondary | ICD-10-CM | POA: Diagnosis not present

## 2017-11-16 DIAGNOSIS — I4891 Unspecified atrial fibrillation: Secondary | ICD-10-CM | POA: Diagnosis not present

## 2017-11-16 DIAGNOSIS — F329 Major depressive disorder, single episode, unspecified: Secondary | ICD-10-CM | POA: Diagnosis not present

## 2017-11-16 DIAGNOSIS — D649 Anemia, unspecified: Secondary | ICD-10-CM | POA: Diagnosis not present

## 2017-11-16 DIAGNOSIS — M1990 Unspecified osteoarthritis, unspecified site: Secondary | ICD-10-CM | POA: Diagnosis not present

## 2017-11-16 DIAGNOSIS — G4733 Obstructive sleep apnea (adult) (pediatric): Secondary | ICD-10-CM | POA: Diagnosis not present

## 2017-11-17 ENCOUNTER — Ambulatory Visit (HOSPITAL_COMMUNITY): Payer: Medicare PPO | Admitting: Certified Registered Nurse Anesthetist

## 2017-11-17 ENCOUNTER — Ambulatory Visit (HOSPITAL_COMMUNITY)
Admission: EM | Admit: 2017-11-17 | Discharge: 2017-11-17 | Disposition: A | Discharge status: Home / Self Care | Payer: Medicare PPO | Source: Ambulatory Visit | Attending: Gastroenterology | Admitting: Gastroenterology

## 2017-11-17 ENCOUNTER — Other Ambulatory Visit: Payer: Self-pay | Admitting: Gastroenterology

## 2017-11-17 ENCOUNTER — Encounter (HOSPITAL_COMMUNITY)
Admission: EM | Disposition: A | Discharge status: Home / Self Care | Payer: Self-pay | Source: Ambulatory Visit | Attending: Gastroenterology

## 2017-11-17 ENCOUNTER — Other Ambulatory Visit: Payer: Self-pay

## 2017-11-17 ENCOUNTER — Other Ambulatory Visit: Payer: Self-pay | Admitting: Neurology

## 2017-11-17 ENCOUNTER — Encounter (HOSPITAL_COMMUNITY): Payer: Self-pay

## 2017-11-17 DIAGNOSIS — D500 Iron deficiency anemia secondary to blood loss (chronic): Secondary | ICD-10-CM | POA: Diagnosis not present

## 2017-11-17 DIAGNOSIS — Z8249 Family history of ischemic heart disease and other diseases of the circulatory system: Secondary | ICD-10-CM | POA: Diagnosis not present

## 2017-11-17 DIAGNOSIS — K5730 Diverticulosis of large intestine without perforation or abscess without bleeding: Secondary | ICD-10-CM | POA: Insufficient documentation

## 2017-11-17 DIAGNOSIS — H9190 Unspecified hearing loss, unspecified ear: Secondary | ICD-10-CM | POA: Diagnosis not present

## 2017-11-17 DIAGNOSIS — K529 Noninfective gastroenteritis and colitis, unspecified: Secondary | ICD-10-CM | POA: Diagnosis not present

## 2017-11-17 DIAGNOSIS — G4733 Obstructive sleep apnea (adult) (pediatric): Secondary | ICD-10-CM | POA: Diagnosis not present

## 2017-11-17 DIAGNOSIS — M1990 Unspecified osteoarthritis, unspecified site: Secondary | ICD-10-CM | POA: Diagnosis not present

## 2017-11-17 DIAGNOSIS — K921 Melena: Secondary | ICD-10-CM | POA: Diagnosis not present

## 2017-11-17 DIAGNOSIS — Z79899 Other long term (current) drug therapy: Secondary | ICD-10-CM | POA: Diagnosis not present

## 2017-11-17 DIAGNOSIS — Z85820 Personal history of malignant melanoma of skin: Secondary | ICD-10-CM | POA: Diagnosis not present

## 2017-11-17 DIAGNOSIS — I4891 Unspecified atrial fibrillation: Secondary | ICD-10-CM | POA: Diagnosis not present

## 2017-11-17 DIAGNOSIS — K922 Gastrointestinal hemorrhage, unspecified: Secondary | ICD-10-CM | POA: Diagnosis not present

## 2017-11-17 DIAGNOSIS — K219 Gastro-esophageal reflux disease without esophagitis: Secondary | ICD-10-CM | POA: Diagnosis not present

## 2017-11-17 DIAGNOSIS — E039 Hypothyroidism, unspecified: Secondary | ICD-10-CM | POA: Insufficient documentation

## 2017-11-17 DIAGNOSIS — Z87891 Personal history of nicotine dependence: Secondary | ICD-10-CM | POA: Insufficient documentation

## 2017-11-17 DIAGNOSIS — I10 Essential (primary) hypertension: Secondary | ICD-10-CM | POA: Diagnosis not present

## 2017-11-17 DIAGNOSIS — Z90710 Acquired absence of both cervix and uterus: Secondary | ICD-10-CM | POA: Insufficient documentation

## 2017-11-17 DIAGNOSIS — K5190 Ulcerative colitis, unspecified, without complications: Secondary | ICD-10-CM | POA: Diagnosis not present

## 2017-11-17 HISTORY — PX: BIOPSY: SHX5522

## 2017-11-17 HISTORY — PX: COLONOSCOPY WITH PROPOFOL: SHX5780

## 2017-11-17 SURGERY — COLONOSCOPY WITH PROPOFOL
Anesthesia: Monitor Anesthesia Care

## 2017-11-17 MED ORDER — LACTATED RINGERS IV SOLN
INTRAVENOUS | Status: DC
  Administered 2017-11-17: 18:00:00 via INTRAVENOUS

## 2017-11-17 MED ORDER — PROPOFOL 10 MG/ML IV BOLUS
INTRAVENOUS | Status: AC
  Filled 2017-11-17: qty 20

## 2017-11-17 MED ORDER — SODIUM CHLORIDE 0.9 % IV SOLN: INTRAVENOUS | Status: DC

## 2017-11-17 MED ORDER — PROPOFOL 10 MG/ML IV BOLUS
INTRAVENOUS | Status: AC
  Filled 2017-11-17: qty 40

## 2017-11-17 MED ORDER — ONDANSETRON HCL 4 MG/2ML IJ SOLN
INTRAMUSCULAR | Status: DC | PRN
  Administered 2017-11-17: 4 mg via INTRAVENOUS

## 2017-11-17 MED ORDER — ONDANSETRON HCL 4 MG/2ML IJ SOLN
INTRAMUSCULAR | Status: AC
  Filled 2017-11-17: qty 2

## 2017-11-17 MED ORDER — PROPOFOL 500 MG/50ML IV EMUL
INTRAVENOUS | Status: DC | PRN
  Administered 2017-11-17: 150 ug/kg/min via INTRAVENOUS

## 2017-11-17 MED ORDER — PROPOFOL 10 MG/ML IV BOLUS
INTRAVENOUS | Status: DC | PRN
  Administered 2017-11-17 (×4): 10 mg via INTRAVENOUS

## 2017-11-17 SURGICAL SUPPLY — 21 items

## 2017-11-17 NOTE — Discharge Instructions (Signed)
YOU HAD AN ENDOSCOPIC PROCEDURE TODAY: Refer to the procedure report and other information in the discharge instructions given to you for any specific questions about what was found during the examination. If this information does not answer your questions, please call [facility] office at [phone] to clarify.   YOU SHOULD EXPECT: Some feelings of bloating in the abdomen. Passage of more gas than usual. Walking can help get rid of the air that was put into your GI tract during the procedure and reduce the bloating. If you had a lower endoscopy (such as a colonoscopy or flexible sigmoidoscopy) you may notice spotting of blood in your stool or on the toilet paper. Some abdominal soreness may be present for a day or two, also.  DIET: Your first meal following the procedure should be a light meal and then it is ok to progress to your normal diet. A half-sandwich or bowl of soup is an example of a good first meal. Heavy or fried foods are harder to digest and may make you feel nauseous or bloated. Drink plenty of fluids but you should avoid alcoholic beverages for 24 hours.   ACTIVITY: Your care partner should take you home directly after the procedure. You should plan to take it easy, moving slowly for the rest of the day. You can resume normal activity the day after the procedure however YOU SHOULD NOT DRIVE, use power tools, machinery or perform tasks that involve climbing or major physical exertion for 24 hours (because of the sedation medicines used during the test).   SYMPTOMS TO REPORT IMMEDIATELY: A gastroenterologist can be reached at any hour. Please call [phone]  for any of the following symptoms:  Following lower endoscopy (colonoscopy, flexible sigmoidoscopy) Excessive amounts of blood in the stool  Significant tenderness, worsening of abdominal pains  Swelling of the abdomen that is new, acute  Fever of 100 or higher    FOLLOW UP:  If any biopsies were taken you will be contacted  by phone or by letter within the next 5 days. Call [phone]  if you have not heard about the biopsies in 3 weeks.  Please also call with any specific questions about appointments or follow up tests. Call if question or problem otherwise call for biopsies as above and follow-up as needed or in one month

## 2017-11-17 NOTE — Anesthesia Preprocedure Evaluation (Addendum)
Anesthesia Evaluation  Patient identified by MRN, date of birth, ID band Patient awake    Reviewed: Allergy & Precautions, NPO status , Patient's Chart, lab work & pertinent test results  Airway Mallampati: III  TM Distance: >3 FB Neck ROM: Full    Dental no notable dental hx.    Pulmonary sleep apnea and Continuous Positive Airway Pressure Ventilation , former smoker,    Pulmonary exam normal breath sounds clear to auscultation       Cardiovascular hypertension, Pt. on medications Normal cardiovascular exam+ dysrhythmias Atrial Fibrillation  Rhythm:Regular Rate:Normal  ECG: A-fib  ECHO:  The right ventricular systolic pressure was increased consistent with mild pulmonary hypertension. LV EF: 60% -   65%    Neuro/Psych negative neurological ROS  negative psych ROS   GI/Hepatic Neg liver ROS, GERD  Medicated and Controlled,  Endo/Other  Hypothyroidism   Renal/GU negative Renal ROS     Musculoskeletal negative musculoskeletal ROS (+)   Abdominal   Peds  Hematology  (+) anemia , HLD   Anesthesia Other Findings GI bleed  Reproductive/Obstetrics                            Anesthesia Physical Anesthesia Plan  ASA: III  Anesthesia Plan: MAC   Post-op Pain Management:    Induction: Intravenous  PONV Risk Score and Plan: 2 and Propofol infusion and Treatment may vary due to age or medical condition  Airway Management Planned: Natural Airway  Additional Equipment:   Intra-op Plan:   Post-operative Plan:   Informed Consent: I have reviewed the patients History and Physical, chart, labs and discussed the procedure including the risks, benefits and alternatives for the proposed anesthesia with the patient or authorized representative who has indicated his/her understanding and acceptance.   Dental advisory given  Plan Discussed with: CRNA  Anesthesia Plan Comments: (Recent  presumptive pulseless electrical activity cardiac arrest)       Anesthesia Quick Evaluation

## 2017-11-17 NOTE — Anesthesia Procedure Notes (Signed)
Procedure Name: MAC Date/Time: 11/17/2017 6:27 PM Performed by: Maxwell Caul, CRNA Pre-anesthesia Checklist: Patient identified, Emergency Drugs available, Suction available and Patient being monitored Oxygen Delivery Method: Simple face mask

## 2017-11-17 NOTE — Op Note (Signed)
[facility] Patient Name: Cynthia [name] Procedure Date: 11/17/2017 MRN: [id number] Attending MD: Clarene Essex , MD Date of Birth: [birth year] CSN: [id number] Age: 77 Admit Type: Outpatient Procedure:                Colonoscopy Indications:              Heme positive stool, Iron deficiency anemia                            secondary to chronic blood loss Providers:                Clarene Essex, MD, Burtis Junes, RN, Charolette Child,                            Technician, Virgia Land, CRNA Referring MD:              Medicines:                Propofol total dose 450 mg IV, Ondansetron 4 mg IV Complications:            No immediate complications. Estimated Blood Loss:     Estimated blood loss: none. Procedure:                Pre-Anesthesia Assessment:                           - Prior to the procedure, a History and Physical                            was performed, and patient medications and                            allergies were reviewed. The patient's tolerance of                            previous anesthesia was also reviewed. The risks                            and benefits of the procedure and the sedation                            options and risks were discussed with the patient.                            All questions were answered, and informed consent                            was obtained. Prior Anticoagulants: The patient has                            taken no previous anticoagulant or antiplatelet                            agents. ASA Grade Assessment: III - A patient with  severe systemic disease. After reviewing the risks                            and benefits, the patient was deemed in                            satisfactory condition to undergo the procedure.                           After obtaining informed consent, the colonoscope                            was passed under direct vision. Throughout the     procedure, the patient's blood pressure, pulse, and                            oxygen saturations were monitored continuously. The                            CF-HQ190L ([id number] Olympus adult colonoscope was                            introduced through the anus and advanced to the the                            terminal ileum. The terminal ileum, ileocecal                            valve, appendiceal orifice, and rectum were                            photographed. The colonoscopy was somewhat                            difficult due to significant looping and a tortuous                            colon. Successful completion of the procedure was                            aided by applying abdominal pressure. The patient                            tolerated the procedure well. The quality of the                            bowel preparation was adequate. Scope In: 6:38:40 PM Scope Out: 7:07:06 PM Scope Withdrawal Time: 0 hours 14 minutes 29 seconds  Total Procedure Duration: 0 hours 28 minutes 26 seconds  Findings:      A few small-mouthed diverticula were found in the sigmoid colon.      Discontinuous areas of nonbleeding ulcerated mucosa with no stigmata of       recent bleeding were present in the sigmoid colon and in the descending  colon. Biopsies were taken with a cold forceps for histology.      The terminal ileum appeared normal.      The exam was otherwise without abnormality. Impression:               - Diverticulosis in the sigmoid colon.                           - Mucosal ulceration. Biopsied. Probably from mild                            ischemic changes during her arrest and doubt cause                            of previous blood loss                           - The examined portion of the ileum was normal.                           - The examination was otherwise normal. Moderate Sedation:      N/A- Per Anesthesia Care Recommendation:           - Patient has a  contact number available for                            emergencies. The signs and symptoms of potential                            delayed complications were discussed with the                            patient. Return to normal activities tomorrow.                            Written discharge instructions were provided to the                            patient.                           - Soft diet today.                           - Continue present medications.                           - Await pathology results.                           - Repeat colonoscopy PRN for screening purposes.                           - Return to GI office in 4 weeks. Consider capsule                            endoscopy next particularly  if signs of GI bleeding                            continue                           - Telephone GI clinic if symptomatic PRN.                           - Telephone GI clinic for pathology results in 5                            days. Procedure Code(s):        --- Professional ---                           [phone], Colonoscopy, flexible; with biopsy, single                            or multiple Diagnosis Code(s):        --- Professional ---                           K63.3, Ulcer of intestine                           R19.5, Other fecal abnormalities                           D50.0, Iron deficiency anemia secondary to blood                            loss (chronic)                           K57.30, Diverticulosis of large intestine without                            perforation or abscess without bleeding CPT copyright 2017 [employer]. All rights reserved. The codes documented in this report are preliminary and upon coder review may  be revised to meet current compliance requirements. Clarene Essex, MD 11/17/2017 7:34:04 PM This report has been signed electronically. Number of Addenda: 0

## 2017-11-17 NOTE — H&P (Signed)
Cynthia [name] is an 77 y.o. female.   Chief Complaint: GI blood loss HPI: patient well-known to me from years of GI screening who recently had a GI bleed on blood thinners and we saw her in the hospital and when she went home she had a cardiac event and her hospital stay was reviewed and cardiology did clear her for an outpatient colonoscopy and she presented today to our office for that but our nurse and acidosis their preferred her to be done at the hospital and she has not had any further GI issues at home but her stools are black but she is on iron and she has no other complaints  Past Medical History:  Diagnosis Date  . A-fib ([place])   . Anemia   . Arthritis    "joints" (11/10/2017)  . GERD (gastroesophageal reflux disease)   . Hearing loss   . History of blood transfusion 10/2017  . Hypertension   . Hyperthyroidism   . Melanoma ([place])    "cut off my back"  . OSA on CPAP   . Sinus headache     Past Surgical History:  Procedure Laterality Date  . ABDOMINAL HYSTERECTOMY     "partial"  . APPENDECTOMY    . CRANIECTOMY FOR EXCISION OF ACOUSTIC NEUROMA    . ESOPHAGOGASTRODUODENOSCOPY (EGD) WITH PROPOFOL N/A 10/28/2017   Procedure: ESOPHAGOGASTRODUODENOSCOPY (EGD) WITH PROPOFOL;  Surgeon: Clarene Essex, MD;  Location: [facility];  Service: Endoscopy;  Laterality: N/A;  . KNEE ARTHROSCOPY Left   . LEFT HEART CATH AND CORONARY ANGIOGRAPHY N/A 11/24/2016   Procedure: LEFT HEART CATH AND CORONARY ANGIOGRAPHY;  Surgeon: Jettie Booze, MD;  Location: [facility];  Service: Cardiovascular;  Laterality: N/A;  . MELANOMA EXCISION     "off my back"  . NASAL SINUS SURGERY    . TONSILLECTOMY      Family History  Problem Relation Age of Onset  . Congestive Heart Failure Mother   . Heart disease Father        History of heart attacks at a later age.     Social History:  reports that she quit smoking about 39 years ago. Her smoking use included cigarettes. She quit after 2.00  years of use. She has never used smokeless tobacco. She reports that she drinks alcohol. She reports that she does not use drugs.  Allergies: No Known Allergies  Medications Prior to Admission  Medication Sig Dispense Refill  . Alpha-Lipoic Acid (LIPOIC ACID PO) Take 600 mg by mouth 2 (two) times daily.     Marland Kitchen atorvastatin (LIPITOR) 10 MG tablet TAKE 1 TABLET BY MOUTH EVERY DAY AT 6 PM 30 tablet 12  . CALCIUM PO Take 600 mg by mouth 2 (two) times daily.     . cetirizine (ZYRTEC) 10 MG tablet Take 10 mg by mouth daily.    . cholecalciferol (VITAMIN D) 1000 UNITS tablet Take 1,000 Units by mouth daily.    Marland Kitchen CRANBERRY CONCENTRATE PO Take 650 mg by mouth daily.     . cycloSPORINE (RESTASIS) 0.05 % ophthalmic emulsion Place 1 drop into both eyes 2 (two) times daily.    Marland Kitchen diltiazem (CARDIZEM CD) 120 MG 24 hr capsule Take 1 capsule (120 mg total) by mouth daily. 30 capsule 12  . DULoxetine (CYMBALTA) 30 MG capsule TAKE 1 CAPSULE (30 MG TOTAL) BY MOUTH 2 (TWO) TIMES DAILY. 180 capsule 2  . GLUCOSAMINE-CHONDROITIN PO Take 2 tablets by mouth daily with lunch. Glucosamine 1500mg  Chondroitin  1200mg     . losartan (COZAAR) 50 MG tablet Take 1 tablet (50 mg total) by mouth daily. 30 tablet 0  . Lutein-Zeaxanthin 25-5 MG CAPS Take 1 tablet by mouth daily.    . Melatonin 5 MG TABS Take 5 mg by mouth at bedtime.    . Meth-Hyo-M Bl-Na Phos-Ph Sal (URIBEL PO) Take 36 mg by mouth daily.    . Multiple Vitamin (MULTIVITAMIN WITH MINERALS) TABS tablet Take 1 tablet by mouth daily.    . Omega-3 1000 MG CAPS Take 1,000 mg by mouth daily.    . pantoprazole (PROTONIX) 40 MG tablet Take 40 mg by mouth daily before breakfast.   1  . thyroid (ARMOUR) 60 MG tablet Take 60 mg by mouth daily before breakfast.    . tiZANidine (ZANAFLEX) 4 MG tablet TAKE 1 TABLET BY MOUTH THREE TIMES A DAY AS NEEDED (Patient taking differently: TAKE 1 TABLET BY MOUTH DAILY AS NEEDED SPASMS) 90 tablet 5  . vitamin C (ASCORBIC ACID) 500 MG tablet  Take 500 mg by mouth daily.     . ranitidine (ZANTAC) 150 MG tablet Take 1 tablet (150 mg total) by mouth 2 (two) times daily. 20 tablet 0    No results found for this or any previous visit (from the past 48 hour(s)). No results found.  ROSnegative except above  Blood pressure (!) 169/96, pulse 82, temperature 98.2 F (36.8 C), temperature source Oral, resp. rate 15, SpO2 96 %. Physical Exam  Please see preassessment evaluation a follow-up hemoglobin was stable in her primary care doctor's office recently Assessment/Plan GI blood loss in patient on blood thinners Plan: We'll proceed with colonoscopy with anesthesia assistance with further workup and plans pending those findings and her case discussed with her husband and son as well  Gethsemane Fischler E, MD 11/17/2017, 6:17 PM

## 2017-11-17 NOTE — Anesthesia Postprocedure Evaluation (Signed)
Anesthesia Post Note  Patient: Cynthia [name]  Procedure(s) Performed: COLONOSCOPY WITH PROPOFOL (N/A )     Patient location during evaluation: PACU Anesthesia Type: MAC Level of consciousness: awake and alert Pain management: pain level controlled Vital Signs Assessment: post-procedure vital signs reviewed and stable Respiratory status: spontaneous breathing, nonlabored ventilation, respiratory function stable and patient connected to nasal cannula oxygen Cardiovascular status: stable and blood pressure returned to baseline Postop Assessment: no apparent nausea or vomiting Anesthetic complications: no    Last Vitals:  Vitals:   11/17/17 1930 11/17/17 1935  BP: (!) 151/78 (!) 159/90  Pulse: 74 74  Resp: 19 (!) 23  Temp:    SpO2: 100% 97%    Last Pain:  Vitals:   11/17/17 1930  TempSrc:   PainSc: 0-No pain                 Adal Sereno P Yoshimi Sarr

## 2017-11-17 NOTE — Transfer of Care (Signed)
Immediate Anesthesia Transfer of Care Note  Patient: Cynthia [name]  Procedure(s) Performed: COLONOSCOPY WITH PROPOFOL (N/A )  Patient Location: PACU  Anesthesia Type:MAC  Level of Consciousness: awake, alert  and oriented  Airway & Oxygen Therapy: Patient Spontanous Breathing and Patient connected to face mask oxygen  Post-op Assessment: Report given to RN and Post -op Vital signs reviewed and stable  Post vital signs: Reviewed and stable  Last Vitals:  Vitals Value Taken Time  BP    Temp    Pulse    Resp    SpO2      Last Pain:  Vitals:   11/17/17 1809  TempSrc: Oral  PainSc: 0-No pain         Complications: No apparent anesthesia complications

## 2017-11-18 DIAGNOSIS — I4891 Unspecified atrial fibrillation: Secondary | ICD-10-CM | POA: Diagnosis not present

## 2017-11-18 DIAGNOSIS — E039 Hypothyroidism, unspecified: Secondary | ICD-10-CM | POA: Diagnosis not present

## 2017-11-18 DIAGNOSIS — K922 Gastrointestinal hemorrhage, unspecified: Secondary | ICD-10-CM | POA: Diagnosis not present

## 2017-11-18 DIAGNOSIS — G4733 Obstructive sleep apnea (adult) (pediatric): Secondary | ICD-10-CM | POA: Diagnosis not present

## 2017-11-18 DIAGNOSIS — I10 Essential (primary) hypertension: Secondary | ICD-10-CM | POA: Diagnosis not present

## 2017-11-18 DIAGNOSIS — F329 Major depressive disorder, single episode, unspecified: Secondary | ICD-10-CM | POA: Diagnosis not present

## 2017-11-18 DIAGNOSIS — K219 Gastro-esophageal reflux disease without esophagitis: Secondary | ICD-10-CM | POA: Diagnosis not present

## 2017-11-18 DIAGNOSIS — D649 Anemia, unspecified: Secondary | ICD-10-CM | POA: Diagnosis not present

## 2017-11-18 DIAGNOSIS — M1990 Unspecified osteoarthritis, unspecified site: Secondary | ICD-10-CM | POA: Diagnosis not present

## 2017-11-19 ENCOUNTER — Encounter (HOSPITAL_COMMUNITY): Payer: Self-pay | Admitting: Gastroenterology

## 2017-11-22 DIAGNOSIS — K219 Gastro-esophageal reflux disease without esophagitis: Secondary | ICD-10-CM | POA: Diagnosis not present

## 2017-11-22 DIAGNOSIS — D649 Anemia, unspecified: Secondary | ICD-10-CM | POA: Diagnosis not present

## 2017-11-22 DIAGNOSIS — K922 Gastrointestinal hemorrhage, unspecified: Secondary | ICD-10-CM | POA: Diagnosis not present

## 2017-11-22 DIAGNOSIS — G4733 Obstructive sleep apnea (adult) (pediatric): Secondary | ICD-10-CM | POA: Diagnosis not present

## 2017-11-22 DIAGNOSIS — I4891 Unspecified atrial fibrillation: Secondary | ICD-10-CM | POA: Diagnosis not present

## 2017-11-22 DIAGNOSIS — M1990 Unspecified osteoarthritis, unspecified site: Secondary | ICD-10-CM | POA: Diagnosis not present

## 2017-11-22 DIAGNOSIS — F329 Major depressive disorder, single episode, unspecified: Secondary | ICD-10-CM | POA: Diagnosis not present

## 2017-11-22 DIAGNOSIS — I10 Essential (primary) hypertension: Secondary | ICD-10-CM | POA: Diagnosis not present

## 2017-11-22 DIAGNOSIS — E039 Hypothyroidism, unspecified: Secondary | ICD-10-CM | POA: Diagnosis not present

## 2017-11-24 DIAGNOSIS — D649 Anemia, unspecified: Secondary | ICD-10-CM | POA: Diagnosis not present

## 2017-11-24 DIAGNOSIS — K219 Gastro-esophageal reflux disease without esophagitis: Secondary | ICD-10-CM | POA: Diagnosis not present

## 2017-11-24 DIAGNOSIS — K922 Gastrointestinal hemorrhage, unspecified: Secondary | ICD-10-CM | POA: Diagnosis not present

## 2017-11-24 DIAGNOSIS — G4733 Obstructive sleep apnea (adult) (pediatric): Secondary | ICD-10-CM | POA: Diagnosis not present

## 2017-11-24 DIAGNOSIS — I10 Essential (primary) hypertension: Secondary | ICD-10-CM | POA: Diagnosis not present

## 2017-11-24 DIAGNOSIS — E039 Hypothyroidism, unspecified: Secondary | ICD-10-CM | POA: Diagnosis not present

## 2017-11-24 DIAGNOSIS — I4891 Unspecified atrial fibrillation: Secondary | ICD-10-CM | POA: Diagnosis not present

## 2017-11-24 DIAGNOSIS — F329 Major depressive disorder, single episode, unspecified: Secondary | ICD-10-CM | POA: Diagnosis not present

## 2017-11-24 DIAGNOSIS — M1990 Unspecified osteoarthritis, unspecified site: Secondary | ICD-10-CM | POA: Diagnosis not present

## 2017-11-25 ENCOUNTER — Ambulatory Visit (INDEPENDENT_AMBULATORY_CARE_PROVIDER_SITE_OTHER): Payer: Medicare PPO | Admitting: Neurology

## 2017-11-25 ENCOUNTER — Encounter: Payer: Self-pay | Admitting: Neurology

## 2017-11-25 ENCOUNTER — Other Ambulatory Visit: Payer: Self-pay

## 2017-11-25 ENCOUNTER — Encounter

## 2017-11-25 VITALS — BP 124/70 | HR 77 | Resp 18 | Ht 65.0 in | Wt 151.5 lb

## 2017-11-25 DIAGNOSIS — G4733 Obstructive sleep apnea (adult) (pediatric): Secondary | ICD-10-CM | POA: Diagnosis not present

## 2017-11-25 DIAGNOSIS — M7551 Bursitis of right shoulder: Secondary | ICD-10-CM | POA: Diagnosis not present

## 2017-11-25 DIAGNOSIS — M542 Cervicalgia: Secondary | ICD-10-CM

## 2017-11-25 DIAGNOSIS — M7550 Bursitis of unspecified shoulder: Secondary | ICD-10-CM

## 2017-11-25 DIAGNOSIS — G629 Polyneuropathy, unspecified: Secondary | ICD-10-CM

## 2017-11-25 DIAGNOSIS — R413 Other amnesia: Secondary | ICD-10-CM | POA: Diagnosis not present
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
## 2017-11-29 DIAGNOSIS — I10 Essential (primary) hypertension: Secondary | ICD-10-CM | POA: Diagnosis not present

## 2017-11-29 DIAGNOSIS — G4733 Obstructive sleep apnea (adult) (pediatric): Secondary | ICD-10-CM | POA: Diagnosis not present

## 2017-11-29 DIAGNOSIS — F329 Major depressive disorder, single episode, unspecified: Secondary | ICD-10-CM | POA: Diagnosis not present

## 2017-11-29 DIAGNOSIS — K922 Gastrointestinal hemorrhage, unspecified: Secondary | ICD-10-CM | POA: Diagnosis not present

## 2017-11-29 DIAGNOSIS — I4891 Unspecified atrial fibrillation: Secondary | ICD-10-CM | POA: Diagnosis not present

## 2017-11-29 DIAGNOSIS — D649 Anemia, unspecified: Secondary | ICD-10-CM | POA: Diagnosis not present

## 2017-11-29 DIAGNOSIS — E039 Hypothyroidism, unspecified: Secondary | ICD-10-CM | POA: Diagnosis not present

## 2017-11-29 DIAGNOSIS — K219 Gastro-esophageal reflux disease without esophagitis: Secondary | ICD-10-CM | POA: Diagnosis not present

## 2017-11-29 DIAGNOSIS — M1990 Unspecified osteoarthritis, unspecified site: Secondary | ICD-10-CM | POA: Diagnosis not present

## 2017-11-30 DIAGNOSIS — K922 Gastrointestinal hemorrhage, unspecified: Secondary | ICD-10-CM | POA: Diagnosis not present

## 2017-11-30 DIAGNOSIS — I10 Essential (primary) hypertension: Secondary | ICD-10-CM | POA: Diagnosis not present

## 2017-11-30 DIAGNOSIS — M1990 Unspecified osteoarthritis, unspecified site: Secondary | ICD-10-CM | POA: Diagnosis not present

## 2017-11-30 DIAGNOSIS — I4891 Unspecified atrial fibrillation: Secondary | ICD-10-CM | POA: Diagnosis not present

## 2017-11-30 DIAGNOSIS — K219 Gastro-esophageal reflux disease without esophagitis: Secondary | ICD-10-CM | POA: Diagnosis not present

## 2017-11-30 DIAGNOSIS — G4733 Obstructive sleep apnea (adult) (pediatric): Secondary | ICD-10-CM | POA: Diagnosis not present

## 2017-11-30 DIAGNOSIS — E039 Hypothyroidism, unspecified: Secondary | ICD-10-CM | POA: Diagnosis not present

## 2017-11-30 DIAGNOSIS — F329 Major depressive disorder, single episode, unspecified: Secondary | ICD-10-CM | POA: Diagnosis not present

## 2017-11-30 DIAGNOSIS — D649 Anemia, unspecified: Secondary | ICD-10-CM | POA: Diagnosis not present

## 2017-12-02 DIAGNOSIS — M1990 Unspecified osteoarthritis, unspecified site: Secondary | ICD-10-CM | POA: Diagnosis not present

## 2017-12-02 DIAGNOSIS — F329 Major depressive disorder, single episode, unspecified: Secondary | ICD-10-CM | POA: Diagnosis not present

## 2017-12-02 DIAGNOSIS — I4891 Unspecified atrial fibrillation: Secondary | ICD-10-CM | POA: Diagnosis not present

## 2017-12-02 DIAGNOSIS — E039 Hypothyroidism, unspecified: Secondary | ICD-10-CM | POA: Diagnosis not present

## 2017-12-02 DIAGNOSIS — G4733 Obstructive sleep apnea (adult) (pediatric): Secondary | ICD-10-CM | POA: Diagnosis not present

## 2017-12-02 DIAGNOSIS — I10 Essential (primary) hypertension: Secondary | ICD-10-CM | POA: Diagnosis not present

## 2017-12-02 DIAGNOSIS — K219 Gastro-esophageal reflux disease without esophagitis: Secondary | ICD-10-CM | POA: Diagnosis not present

## 2017-12-02 DIAGNOSIS — K922 Gastrointestinal hemorrhage, unspecified: Secondary | ICD-10-CM | POA: Diagnosis not present

## 2017-12-02 DIAGNOSIS — D649 Anemia, unspecified: Secondary | ICD-10-CM | POA: Diagnosis not present

## 2017-12-06 DIAGNOSIS — K219 Gastro-esophageal reflux disease without esophagitis: Secondary | ICD-10-CM | POA: Diagnosis not present

## 2017-12-06 DIAGNOSIS — I10 Essential (primary) hypertension: Secondary | ICD-10-CM | POA: Diagnosis not present

## 2017-12-06 DIAGNOSIS — E039 Hypothyroidism, unspecified: Secondary | ICD-10-CM | POA: Diagnosis not present

## 2017-12-06 DIAGNOSIS — M1990 Unspecified osteoarthritis, unspecified site: Secondary | ICD-10-CM | POA: Diagnosis not present

## 2017-12-06 DIAGNOSIS — D649 Anemia, unspecified: Secondary | ICD-10-CM | POA: Diagnosis not present

## 2017-12-06 DIAGNOSIS — I4891 Unspecified atrial fibrillation: Secondary | ICD-10-CM | POA: Diagnosis not present

## 2017-12-06 DIAGNOSIS — K922 Gastrointestinal hemorrhage, unspecified: Secondary | ICD-10-CM | POA: Diagnosis not present

## 2017-12-06 DIAGNOSIS — F329 Major depressive disorder, single episode, unspecified: Secondary | ICD-10-CM | POA: Diagnosis not present

## 2017-12-06 DIAGNOSIS — G4733 Obstructive sleep apnea (adult) (pediatric): Secondary | ICD-10-CM | POA: Diagnosis not present

## 2017-12-07 DIAGNOSIS — K219 Gastro-esophageal reflux disease without esophagitis: Secondary | ICD-10-CM | POA: Diagnosis not present

## 2017-12-07 DIAGNOSIS — M1990 Unspecified osteoarthritis, unspecified site: Secondary | ICD-10-CM | POA: Diagnosis not present

## 2017-12-07 DIAGNOSIS — F329 Major depressive disorder, single episode, unspecified: Secondary | ICD-10-CM | POA: Diagnosis not present

## 2017-12-07 DIAGNOSIS — I10 Essential (primary) hypertension: Secondary | ICD-10-CM | POA: Diagnosis not present

## 2017-12-07 DIAGNOSIS — D649 Anemia, unspecified: Secondary | ICD-10-CM | POA: Diagnosis not present

## 2017-12-07 DIAGNOSIS — G4733 Obstructive sleep apnea (adult) (pediatric): Secondary | ICD-10-CM | POA: Diagnosis not present

## 2017-12-07 DIAGNOSIS — E039 Hypothyroidism, unspecified: Secondary | ICD-10-CM | POA: Diagnosis not present

## 2017-12-07 DIAGNOSIS — I4891 Unspecified atrial fibrillation: Secondary | ICD-10-CM | POA: Diagnosis not present

## 2017-12-07 DIAGNOSIS — K922 Gastrointestinal hemorrhage, unspecified: Secondary | ICD-10-CM | POA: Diagnosis not present

## 2017-12-10 DIAGNOSIS — R55 Syncope and collapse: Secondary | ICD-10-CM | POA: Diagnosis not present

## 2017-12-14 DIAGNOSIS — R413 Other amnesia: Secondary | ICD-10-CM | POA: Diagnosis not present

## 2017-12-14 DIAGNOSIS — D649 Anemia, unspecified: Secondary | ICD-10-CM | POA: Diagnosis not present

## 2017-12-14 DIAGNOSIS — I10 Essential (primary) hypertension: Secondary | ICD-10-CM | POA: Diagnosis not present

## 2017-12-14 DIAGNOSIS — M25569 Pain in unspecified knee: Secondary | ICD-10-CM | POA: Diagnosis not present

## 2017-12-15 DIAGNOSIS — K219 Gastro-esophageal reflux disease without esophagitis: Secondary | ICD-10-CM | POA: Diagnosis not present

## 2017-12-15 DIAGNOSIS — G4733 Obstructive sleep apnea (adult) (pediatric): Secondary | ICD-10-CM | POA: Diagnosis not present

## 2017-12-15 DIAGNOSIS — M1990 Unspecified osteoarthritis, unspecified site: Secondary | ICD-10-CM | POA: Diagnosis not present

## 2017-12-15 DIAGNOSIS — F329 Major depressive disorder, single episode, unspecified: Secondary | ICD-10-CM | POA: Diagnosis not present

## 2017-12-15 DIAGNOSIS — K922 Gastrointestinal hemorrhage, unspecified: Secondary | ICD-10-CM | POA: Diagnosis not present

## 2017-12-15 DIAGNOSIS — I4891 Unspecified atrial fibrillation: Secondary | ICD-10-CM | POA: Diagnosis not present

## 2017-12-15 DIAGNOSIS — I10 Essential (primary) hypertension: Secondary | ICD-10-CM | POA: Diagnosis not present

## 2017-12-15 DIAGNOSIS — E039 Hypothyroidism, unspecified: Secondary | ICD-10-CM | POA: Diagnosis not present

## 2017-12-15 DIAGNOSIS — D649 Anemia, unspecified: Secondary | ICD-10-CM | POA: Diagnosis not present

## 2017-12-16 DIAGNOSIS — K219 Gastro-esophageal reflux disease without esophagitis: Secondary | ICD-10-CM | POA: Diagnosis not present

## 2017-12-16 DIAGNOSIS — E039 Hypothyroidism, unspecified: Secondary | ICD-10-CM | POA: Diagnosis not present

## 2017-12-16 DIAGNOSIS — K922 Gastrointestinal hemorrhage, unspecified: Secondary | ICD-10-CM | POA: Diagnosis not present

## 2017-12-16 DIAGNOSIS — F329 Major depressive disorder, single episode, unspecified: Secondary | ICD-10-CM | POA: Diagnosis not present

## 2017-12-16 DIAGNOSIS — M1990 Unspecified osteoarthritis, unspecified site: Secondary | ICD-10-CM | POA: Diagnosis not present

## 2017-12-16 DIAGNOSIS — I10 Essential (primary) hypertension: Secondary | ICD-10-CM | POA: Diagnosis not present

## 2017-12-16 DIAGNOSIS — G4733 Obstructive sleep apnea (adult) (pediatric): Secondary | ICD-10-CM | POA: Diagnosis not present

## 2017-12-16 DIAGNOSIS — D649 Anemia, unspecified: Secondary | ICD-10-CM | POA: Diagnosis not present

## 2017-12-16 DIAGNOSIS — I4891 Unspecified atrial fibrillation: Secondary | ICD-10-CM | POA: Diagnosis not present

## 2017-12-17 DIAGNOSIS — K921 Melena: Secondary | ICD-10-CM | POA: Diagnosis not present

## 2017-12-17 DIAGNOSIS — D500 Iron deficiency anemia secondary to blood loss (chronic): Secondary | ICD-10-CM | POA: Diagnosis not present

## 2017-12-20 DIAGNOSIS — D500 Iron deficiency anemia secondary to blood loss (chronic): Secondary | ICD-10-CM | POA: Diagnosis not present

## 2017-12-20 DIAGNOSIS — I10 Essential (primary) hypertension: Secondary | ICD-10-CM | POA: Diagnosis not present

## 2017-12-20 DIAGNOSIS — I480 Paroxysmal atrial fibrillation: Secondary | ICD-10-CM | POA: Diagnosis not present

## 2017-12-20 DIAGNOSIS — Z0189 Encounter for other specified special examinations: Secondary | ICD-10-CM | POA: Diagnosis not present

## 2017-12-28 DIAGNOSIS — I480 Paroxysmal atrial fibrillation: Secondary | ICD-10-CM | POA: Diagnosis not present

## 2017-12-28 DIAGNOSIS — I10 Essential (primary) hypertension: Secondary | ICD-10-CM | POA: Diagnosis not present

## 2017-12-28 DIAGNOSIS — D500 Iron deficiency anemia secondary to blood loss (chronic): Secondary | ICD-10-CM | POA: Diagnosis not present

## 2017-12-28 DIAGNOSIS — Z0189 Encounter for other specified special examinations: Secondary | ICD-10-CM | POA: Diagnosis not present

## 2018-01-02 ENCOUNTER — Other Ambulatory Visit: Payer: Self-pay | Admitting: Cardiology

## 2018-01-05 DIAGNOSIS — R399 Unspecified symptoms and signs involving the genitourinary system: Secondary | ICD-10-CM | POA: Diagnosis not present

## 2018-01-05 DIAGNOSIS — N390 Urinary tract infection, site not specified: Secondary | ICD-10-CM | POA: Diagnosis not present

## 2018-01-11 DIAGNOSIS — N3090 Cystitis, unspecified without hematuria: Secondary | ICD-10-CM | POA: Diagnosis not present

## 2018-01-11 DIAGNOSIS — I10 Essential (primary) hypertension: Secondary | ICD-10-CM | POA: Diagnosis not present

## 2018-01-11 DIAGNOSIS — B9689 Other specified bacterial agents as the cause of diseases classified elsewhere: Secondary | ICD-10-CM | POA: Diagnosis not present

## 2018-01-26 ENCOUNTER — Other Ambulatory Visit: Payer: Self-pay | Admitting: Cardiology

## 2018-01-26 NOTE — Telephone Encounter (Signed)
Rx request sent to pharmacy.  

## 2018-01-27 DIAGNOSIS — I480 Paroxysmal atrial fibrillation: Secondary | ICD-10-CM | POA: Diagnosis not present

## 2018-01-27 DIAGNOSIS — I10 Essential (primary) hypertension: Secondary | ICD-10-CM | POA: Diagnosis not present

## 2018-01-27 DIAGNOSIS — G4733 Obstructive sleep apnea (adult) (pediatric): Secondary | ICD-10-CM | POA: Diagnosis not present

## 2018-01-27 DIAGNOSIS — D500 Iron deficiency anemia secondary to blood loss (chronic): Secondary | ICD-10-CM | POA: Diagnosis not present

## 2018-02-09 DIAGNOSIS — N390 Urinary tract infection, site not specified: Secondary | ICD-10-CM | POA: Diagnosis not present

## 2018-02-09 DIAGNOSIS — R358 Other polyuria: Secondary | ICD-10-CM | POA: Diagnosis not present

## 2018-02-14 DIAGNOSIS — M810 Age-related osteoporosis without current pathological fracture: Secondary | ICD-10-CM | POA: Diagnosis not present

## 2018-02-14 DIAGNOSIS — E7800 Pure hypercholesterolemia, unspecified: Secondary | ICD-10-CM | POA: Diagnosis not present

## 2018-02-14 DIAGNOSIS — M8580 Other specified disorders of bone density and structure, unspecified site: Secondary | ICD-10-CM | POA: Diagnosis not present

## 2018-02-14 DIAGNOSIS — N390 Urinary tract infection, site not specified: Secondary | ICD-10-CM | POA: Diagnosis not present

## 2018-02-17 DIAGNOSIS — M25569 Pain in unspecified knee: Secondary | ICD-10-CM | POA: Diagnosis not present

## 2018-02-17 DIAGNOSIS — N390 Urinary tract infection, site not specified: Secondary | ICD-10-CM | POA: Diagnosis not present

## 2018-02-17 DIAGNOSIS — M25519 Pain in unspecified shoulder: Secondary | ICD-10-CM | POA: Diagnosis not present

## 2018-02-17 DIAGNOSIS — I10 Essential (primary) hypertension: Secondary | ICD-10-CM | POA: Diagnosis not present

## 2018-02-17 DIAGNOSIS — Z23 Encounter for immunization: Secondary | ICD-10-CM | POA: Diagnosis not present

## 2018-02-17 DIAGNOSIS — E039 Hypothyroidism, unspecified: Secondary | ICD-10-CM | POA: Diagnosis not present

## 2018-02-17 DIAGNOSIS — I480 Paroxysmal atrial fibrillation: Secondary | ICD-10-CM | POA: Diagnosis not present

## 2018-02-17 DIAGNOSIS — G4733 Obstructive sleep apnea (adult) (pediatric): Secondary | ICD-10-CM | POA: Diagnosis not present

## 2018-02-17 DIAGNOSIS — J309 Allergic rhinitis, unspecified: Secondary | ICD-10-CM | POA: Diagnosis not present

## 2018-02-18 ENCOUNTER — Other Ambulatory Visit: Payer: Self-pay | Admitting: Cardiology

## 2018-02-23 DIAGNOSIS — R300 Dysuria: Secondary | ICD-10-CM | POA: Diagnosis not present

## 2018-02-23 DIAGNOSIS — N390 Urinary tract infection, site not specified: Secondary | ICD-10-CM | POA: Diagnosis not present

## 2018-03-07 ENCOUNTER — Other Ambulatory Visit: Payer: Self-pay | Admitting: Cardiology

## 2018-03-11 DIAGNOSIS — N390 Urinary tract infection, site not specified: Secondary | ICD-10-CM | POA: Diagnosis not present

## 2018-03-16 ENCOUNTER — Emergency Department (HOSPITAL_COMMUNITY): Payer: Medicare PPO

## 2018-03-16 ENCOUNTER — Other Ambulatory Visit: Payer: Self-pay

## 2018-03-16 ENCOUNTER — Encounter (HOSPITAL_COMMUNITY): Payer: Self-pay

## 2018-03-16 ENCOUNTER — Emergency Department (HOSPITAL_COMMUNITY)
Admission: EM | Admit: 2018-03-16 | Discharge: 2018-03-16 | Disposition: A | Discharge status: Home / Self Care | Payer: Medicare PPO | Source: Home / Self Care | Attending: Emergency Medicine | Admitting: Emergency Medicine

## 2018-03-16 DIAGNOSIS — Z85820 Personal history of malignant melanoma of skin: Secondary | ICD-10-CM | POA: Diagnosis not present

## 2018-03-16 DIAGNOSIS — H9190 Unspecified hearing loss, unspecified ear: Secondary | ICD-10-CM | POA: Diagnosis present

## 2018-03-16 DIAGNOSIS — I10 Essential (primary) hypertension: Secondary | ICD-10-CM | POA: Diagnosis present

## 2018-03-16 DIAGNOSIS — Z79899 Other long term (current) drug therapy: Secondary | ICD-10-CM | POA: Diagnosis not present

## 2018-03-16 DIAGNOSIS — R55 Syncope and collapse: Secondary | ICD-10-CM | POA: Diagnosis present

## 2018-03-16 DIAGNOSIS — K219 Gastro-esophageal reflux disease without esophagitis: Secondary | ICD-10-CM | POA: Diagnosis present

## 2018-03-16 DIAGNOSIS — R079 Chest pain, unspecified: Secondary | ICD-10-CM | POA: Diagnosis not present

## 2018-03-16 DIAGNOSIS — E785 Hyperlipidemia, unspecified: Secondary | ICD-10-CM | POA: Diagnosis present

## 2018-03-16 DIAGNOSIS — R197 Diarrhea, unspecified: Secondary | ICD-10-CM | POA: Diagnosis not present

## 2018-03-16 DIAGNOSIS — Z7901 Long term (current) use of anticoagulants: Secondary | ICD-10-CM | POA: Diagnosis not present

## 2018-03-16 DIAGNOSIS — R0602 Shortness of breath: Secondary | ICD-10-CM | POA: Diagnosis not present

## 2018-03-16 DIAGNOSIS — I959 Hypotension, unspecified: Secondary | ICD-10-CM | POA: Diagnosis not present

## 2018-03-16 DIAGNOSIS — N390 Urinary tract infection, site not specified: Secondary | ICD-10-CM | POA: Diagnosis present

## 2018-03-16 DIAGNOSIS — R001 Bradycardia, unspecified: Secondary | ICD-10-CM | POA: Diagnosis present

## 2018-03-16 DIAGNOSIS — I480 Paroxysmal atrial fibrillation: Secondary | ICD-10-CM | POA: Diagnosis present

## 2018-03-16 DIAGNOSIS — R5382 Chronic fatigue, unspecified: Secondary | ICD-10-CM | POA: Diagnosis present

## 2018-03-16 DIAGNOSIS — R000 Tachycardia, unspecified: Secondary | ICD-10-CM | POA: Diagnosis not present

## 2018-03-16 DIAGNOSIS — I951 Orthostatic hypotension: Secondary | ICD-10-CM | POA: Diagnosis present

## 2018-03-16 DIAGNOSIS — Z9989 Dependence on other enabling machines and devices: Secondary | ICD-10-CM | POA: Diagnosis not present

## 2018-03-16 DIAGNOSIS — R42 Dizziness and giddiness: Secondary | ICD-10-CM | POA: Diagnosis not present

## 2018-03-16 DIAGNOSIS — G629 Polyneuropathy, unspecified: Secondary | ICD-10-CM | POA: Diagnosis present

## 2018-03-16 DIAGNOSIS — G4733 Obstructive sleep apnea (adult) (pediatric): Secondary | ICD-10-CM | POA: Diagnosis present

## 2018-03-16 DIAGNOSIS — R0902 Hypoxemia: Secondary | ICD-10-CM | POA: Diagnosis not present

## 2018-03-16 LAB — CBC WITH DIFFERENTIAL/PLATELET
Abs Immature Granulocytes: 0.01 10*3/uL (ref 0.00–0.07)
Basophils Absolute: 0 10*3/uL (ref 0.0–0.1)
Basophils Relative: 1 %
EOS PCT: 2 %
Eosinophils Absolute: 0.1 10*3/uL (ref 0.0–0.5)
HCT: 33.1 % — ABNORMAL LOW (ref 36.0–46.0)
Hemoglobin: 9.8 g/dL — ABNORMAL LOW (ref 12.0–15.0)
Immature Granulocytes: 0 %
Lymphocytes Relative: 24 %
Lymphs Abs: 1.3 10*3/uL (ref 0.7–4.0)
MCH: 29.4 pg (ref 26.0–34.0)
MCHC: 29.6 g/dL — ABNORMAL LOW (ref 30.0–36.0)
MCV: 99.4 fL (ref 80.0–100.0)
Monocytes Absolute: 0.5 10*3/uL (ref 0.1–1.0)
Monocytes Relative: 10 %
Neutro Abs: 3.4 10*3/uL (ref 1.7–7.7)
Neutrophils Relative %: 63 %
Platelets: 165 10*3/uL (ref 150–400)
RBC: 3.33 MIL/uL — ABNORMAL LOW (ref 3.87–5.11)
RDW: 13 % (ref 11.5–15.5)
WBC: 5.4 10*3/uL (ref 4.0–10.5)
nRBC: 0 % (ref 0.0–0.2)

## 2018-03-16 LAB — COMPREHENSIVE METABOLIC PANEL
ALBUMIN: 3.1 g/dL — AB (ref 3.5–5.0)
ALK PHOS: 41 U/L (ref 38–126)
ALT: 23 U/L (ref 0–44)
AST: 24 U/L (ref 15–41)
Anion gap: 11 (ref 5–15)
BUN: 20 mg/dL (ref 8–23)
CALCIUM: 8.4 mg/dL — AB (ref 8.9–10.3)
CO2: 23 mmol/L (ref 22–32)
CREATININE: 0.7 mg/dL (ref 0.44–1.00)
Chloride: 106 mmol/L (ref 98–111)
GFR calc Af Amer: >60 mL/min (ref >60)
GFR calc non Af Amer: >60 mL/min (ref >60)
Glucose, Bld: 104 mg/dL — ABNORMAL HIGH (ref 70–99)
Potassium: 3.6 mmol/L (ref 3.5–5.1)
Sodium: 140 mmol/L (ref 135–145)
TOTAL PROTEIN: 5.4 g/dL — AB (ref 6.5–8.1)
Total Bilirubin: 0.4 mg/dL (ref 0.3–1.2)

## 2018-03-16 LAB — URINALYSIS, ROUTINE W REFLEX MICROSCOPIC
BILIRUBIN URINE: NEGATIVE
Glucose, UA: NEGATIVE mg/dL
Hgb urine dipstick: NEGATIVE
KETONES UR: NEGATIVE mg/dL
Nitrite: NEGATIVE
Protein, ur: NEGATIVE mg/dL
Specific Gravity, Urine: 1.012 (ref 1.005–1.030)
pH: 7 (ref 5.0–8.0)

## 2018-03-16 LAB — I-STAT TROPONIN, ED: Troponin i, poc: 0.01 ng/mL (ref 0.00–0.08)

## 2018-03-16 MED ORDER — CEPHALEXIN 500 MG PO CAPS: 500.0000 mg | ORAL_CAPSULE | Freq: Four times a day (QID) | ORAL | 0 refills | Status: DC

## 2018-03-16 MED ORDER — SODIUM CHLORIDE 0.9 % IV BOLUS
1000.0000 mL | Freq: Once | INTRAVENOUS | Status: AC
  Administered 2018-03-16: 1000 mL via INTRAVENOUS

## 2018-03-16 MED ORDER — SODIUM CHLORIDE 0.9 % IV SOLN
1.0000 g | Freq: Once | INTRAVENOUS | Status: AC
  Administered 2018-03-16: 1 g via INTRAVENOUS
  Filled 2018-03-16: qty 10

## 2018-03-16 NOTE — ED Notes (Signed)
Patient transported to X-ray 

## 2018-03-16 NOTE — Discharge Instructions (Signed)
Keflex as prescribed.  Up with your primary doctor in the next 1 to 2 weeks, and return to the ER if your symptoms worsen or change.

## 2018-03-16 NOTE — ED Provider Notes (Signed)
[facility] EMERGENCY DEPARTMENT Provider Note   CSN: [id number] Arrival date & time: 03/16/18  1246     History   Chief Complaint Chief Complaint  Patient presents with  . Hypotension  . Bradycardia    HPI Cynthia [name] is a 77 y.o. female.  Patient is a 77 year old female with past medical history of atrial fibrillation, anemia, hypertension.  She presents today for evaluation of weakness and near syncope.  This morning she did not feel well and reports going to the bathroom.  She then became pale and felt dizzy.  EMS was called and the patient was found to be initially hypotensive and somewhat pale.  He was then transported here for evaluation.  She denies to me that she is having any chest pain or difficulty breathing.  She denies any headache, fevers, or chills.  The history is provided by the patient.    Past Medical History:  Diagnosis Date  . A-fib ([place])   . Anemia   . Arthritis    "joints" (11/10/2017)  . GERD (gastroesophageal reflux disease)   . Hearing loss   . History of blood transfusion 10/2017  . Hypertension   . Hyperthyroidism   . Melanoma ([place])    "cut off my back"  . OSA on CPAP   . Sinus headache     Patient Active Problem List   Diagnosis Date Noted  . Memory loss 11/25/2017  . GI bleed 11/06/2017  . Acute lower UTI 10/29/2017  . Acute GI bleeding 10/27/2017  . Acute blood loss anemia 10/27/2017  . Abnormal stress test   . Chest pain 11/23/2016  . Hyperlipidemia 11/23/2016  . GERD (gastroesophageal reflux disease) 11/23/2016  . History of recurrent UTIs 11/23/2016  . Polyneuropathy 10/30/2016  . Bursitis, subacromial 10/30/2016  . Numbness 10/30/2016  . Arthritis, senescent 01/03/2016  . Left knee pain 01/03/2016  . Pain in joint, ankle and foot 02/28/2015  . Dry mouth 08/07/2014  . Dry eyes 08/07/2014  . Other headache syndrome 06/25/2014  . Sinusitis, chronic 06/25/2014  . Neck pain 06/25/2014  . OSA  (obstructive sleep apnea) 06/25/2014  . Essential hypertension, benign 06/25/2014    Past Surgical History:  Procedure Laterality Date  . ABDOMINAL HYSTERECTOMY     "partial"  . APPENDECTOMY    . BIOPSY  11/17/2017   Procedure: BIOPSY;  Surgeon: Clarene Essex, MD;  [facility];  Service: Endoscopy;;  . COLONOSCOPY WITH PROPOFOL N/A 11/17/2017   Procedure: COLONOSCOPY WITH PROPOFOL;  Surgeon: Clarene Essex, MD;  [facility];  Service: Endoscopy;  Laterality: N/A;  . CRANIECTOMY FOR EXCISION OF ACOUSTIC NEUROMA    . ESOPHAGOGASTRODUODENOSCOPY (EGD) WITH PROPOFOL N/A 10/28/2017   Procedure: ESOPHAGOGASTRODUODENOSCOPY (EGD) WITH PROPOFOL;  Surgeon: Clarene Essex, MD;  [facility];  Service: Endoscopy;  Laterality: N/A;  . KNEE ARTHROSCOPY Left   . LEFT HEART CATH AND CORONARY ANGIOGRAPHY N/A 11/24/2016   Procedure: LEFT HEART CATH AND CORONARY ANGIOGRAPHY;  Surgeon: Jettie Booze, MD;  Location: [facility];  Service: Cardiovascular;  Laterality: N/A;  . MELANOMA EXCISION     "off my back"  . NASAL SINUS SURGERY    . TONSILLECTOMY       OB History   None      Home Medications    Prior to Admission medications   Medication Sig Start Date End Date Taking? Authorizing Provider  Alpha-Lipoic Acid (LIPOIC ACID PO) Take 600 mg by mouth 2 (two) times  daily.     [provider]  atorvastatin (LIPITOR) 10 MG tablet TAKE 1 TABLET BY MOUTH EVERY DAY AT 6PM *NEED OFFICE VISIT* 03/07/18   Lelon Perla, MD  CALCIUM PO Take 600 mg by mouth 2 (two) times daily.     [provider]  cetirizine (ZYRTEC) 10 MG tablet Take 10 mg by mouth daily.    [provider]  cholecalciferol (VITAMIN D) 1000 UNITS tablet Take 1,000 Units by mouth daily.    [provider]  CRANBERRY CONCENTRATE PO Take 650 mg by mouth daily.     [provider]  cycloSPORINE (RESTASIS) 0.05 % ophthalmic emulsion Place 1 drop into both eyes 2  (two) times daily.    [provider]  diltiazem (CARDIZEM CD) 120 MG 24 hr capsule TAKE 1 CAPSULE BY MOUTH EVERY DAY *NEED OFFICE VISIT* 03/07/18   Lelon Perla, MD  DULoxetine (CYMBALTA) 30 MG capsule TAKE 1 CAPSULE (30 MG TOTAL) BY MOUTH 2 (TWO) TIMES DAILY. 09/09/17   Sater, Nanine Means, MD  GLUCOSAMINE-CHONDROITIN PO Take 2 tablets by mouth daily with lunch. Glucosamine 1500mg  Chondroitin 1200mg     [provider]  losartan (COZAAR) 50 MG tablet Take 1 tablet (50 mg total) by mouth daily. 11/12/17 12/12/17  [name], [name], MD  Lutein-Zeaxanthin 25-5 MG CAPS Take 1 tablet by mouth daily.    [provider]  Melatonin 5 MG TABS Take 5 mg by mouth at bedtime.    [provider]  Meth-Hyo-M Bl-Na Phos-Ph Sal (URIBEL PO) Take 36 mg by mouth daily.    [provider]  Multiple Vitamin (MULTIVITAMIN WITH MINERALS) TABS tablet Take 1 tablet by mouth daily.    [provider]  Omega-3 1000 MG CAPS Take 1,000 mg by mouth daily.    [provider]  pantoprazole (PROTONIX) 40 MG tablet Take 40 mg by mouth daily before breakfast.  06/12/14   [provider]  ranitidine (ZANTAC) 150 MG tablet Take 1 tablet (150 mg total) by mouth 2 (two) times daily. 12/22/16 11/06/17  Duffy Bruce, MD  thyroid (ARMOUR) 60 MG tablet Take 60 mg by mouth daily before breakfast. 11/07/17   [provider]  tiZANidine (ZANAFLEX) 4 MG tablet TAKE 1 TABLET BY MOUTH THREE TIMES A DAY AS NEEDED Patient taking differently: TAKE 1 TABLET BY MOUTH DAILY AS NEEDED SPASMS 08/04/17   Sater, Nanine Means, MD  vitamin C (ASCORBIC ACID) 500 MG tablet Take 500 mg by mouth daily.     [provider]    Family History Family History  Problem Relation Age of Onset  . Congestive Heart Failure Mother   . Heart disease Father        History of heart attacks at a later age.      Social History Social History   Tobacco Use  . Smoking status: Former  Smoker    Years: 2.00    Types: Cigarettes    Last attempt to quit: 1980    Years since quitting: 39.9  . Smokeless tobacco: Never Used  . Tobacco comment: 11/10/2017 "only smoked when I was on my period"  Substance Use Topics  . Alcohol use: Yes    Comment: 11/10/2017 "nothing lately; did have 1 glass of wine q night"  . Drug use: Never     Allergies   Patient has no known allergies.   Review of Systems Review of Systems  Constitutional: Positive for diaphoresis and fatigue. Negative for chills.  All other systems reviewed and are negative.    Physical Exam Updated Vital Signs BP 103/63 (BP Location: Right Arm)   Pulse 67   Temp (!) 97.5 F (36.4 C) (Oral)   Resp 16   SpO2 96%   Physical Exam  Constitutional: She is oriented to person, place, and time. She appears well-developed and well-nourished. No distress.  She does appear somewhat pale  HENT:  Head: Normocephalic and atraumatic.  Neck: Normal range of motion. Neck supple.  Cardiovascular: Normal rate and regular rhythm. Exam reveals no gallop and no friction rub.  No murmur heard. Pulmonary/Chest: Effort normal and breath sounds normal. No respiratory distress. She has no wheezes.  Abdominal: Soft. Bowel sounds are normal. She exhibits no distension. There is no tenderness.  Musculoskeletal: Normal range of motion.  Neurological: She is alert and oriented to person, place, and time.  Skin: Skin is warm and dry. She is not diaphoretic.  Nursing note and vitals reviewed.    ED Treatments / Results  Labs (all labs ordered are listed, but only abnormal results are displayed) Labs Reviewed  URINE CULTURE  COMPREHENSIVE METABOLIC PANEL  CBC WITH DIFFERENTIAL/PLATELET  URINALYSIS, ROUTINE W REFLEX MICROSCOPIC  I-STAT TROPONIN, ED    EKG EKG Interpretation  Date/Time:  Wednesday March 16 2018 14:31:03 EST Ventricular Rate:  61 PR Interval:  180 QRS Duration: 110 QT Interval:  442 QTC  Calculation: 444 R Axis:   -48 Text Interpretation:  Normal sinus rhythm Left anterior fascicular block Nonspecific T wave abnormality Abnormal ECG Confirmed by Veryl Speak [phone]) on 03/16/2018 1:45:59 PM   Radiology No results found.  Procedures Procedures (including critical care time)  Medications Ordered in ED Medications  sodium chloride 0.9 % bolus 1,000 mL (has no administration in time range)     Initial Impression / Assessment and Plan / ED Course  I have reviewed the triage vital signs and the nursing notes.  Pertinent labs & imaging results that were available during my care of the patient were reviewed by me and considered in my medical decision making (see chart for details).  Patient experienced an episode of weakness, low blood pressure, and near syncope at home.  She has been normotensive here in the ER and laboratory studies are essentially unremarkable.  She does have the suggestive of a urinary tract infection, however I see no other obvious abnormality.  She has been given normal saline in the ED and her vital signs are not orthostatic.  She will be given Rocephin and the plan is to discharge with Keflex.  Final Clinical Impressions(s) / ED Diagnoses   Final diagnoses:  None    ED Discharge Orders    None       Veryl Speak, MD 03/16/18 1539

## 2018-03-16 NOTE — ED Notes (Signed)
Pt's husband -- Aneeka Bowden [phone]

## 2018-03-16 NOTE — ED Triage Notes (Signed)
Per [facility]: Pt from home, near syncopal episode. Was found to be pale and diaphoretic 75/68, HR 65, decreased to 50. No ST elevation. 500 ml saline. BP 80 palpated.  No SOB, no N/V, no chest pain. Pt has had several episodes of diarrhea.  CBG 170  3 LNC 96%  Pt states that she has hx of afib and can become tachycardic.   Pt has been on cipro for long periods of time. Pt has recently been taking mucinex.

## 2018-03-17 ENCOUNTER — Emergency Department (HOSPITAL_COMMUNITY): Payer: Medicare PPO

## 2018-03-17 ENCOUNTER — Encounter (HOSPITAL_COMMUNITY): Payer: Self-pay | Admitting: Emergency Medicine

## 2018-03-17 ENCOUNTER — Other Ambulatory Visit: Payer: Self-pay

## 2018-03-17 ENCOUNTER — Inpatient Hospital Stay (HOSPITAL_COMMUNITY)
Admission: EM | Admit: 2018-03-17 | Discharge: 2018-03-18 | DRG: 312 | Disposition: A | Discharge status: Home Health | Payer: Medicare PPO | Attending: Internal Medicine | Admitting: Internal Medicine

## 2018-03-17 DIAGNOSIS — Z85820 Personal history of malignant melanoma of skin: Secondary | ICD-10-CM

## 2018-03-17 DIAGNOSIS — R5382 Chronic fatigue, unspecified: Secondary | ICD-10-CM | POA: Diagnosis present

## 2018-03-17 DIAGNOSIS — K219 Gastro-esophageal reflux disease without esophagitis: Secondary | ICD-10-CM | POA: Diagnosis present

## 2018-03-17 DIAGNOSIS — H9190 Unspecified hearing loss, unspecified ear: Secondary | ICD-10-CM | POA: Diagnosis present

## 2018-03-17 DIAGNOSIS — Z79899 Other long term (current) drug therapy: Secondary | ICD-10-CM

## 2018-03-17 DIAGNOSIS — I951 Orthostatic hypotension: Principal | ICD-10-CM | POA: Diagnosis present

## 2018-03-17 DIAGNOSIS — E785 Hyperlipidemia, unspecified: Secondary | ICD-10-CM | POA: Diagnosis present

## 2018-03-17 DIAGNOSIS — I959 Hypotension, unspecified: Secondary | ICD-10-CM | POA: Diagnosis present

## 2018-03-17 DIAGNOSIS — I10 Essential (primary) hypertension: Secondary | ICD-10-CM | POA: Diagnosis present

## 2018-03-17 DIAGNOSIS — Z9989 Dependence on other enabling machines and devices: Secondary | ICD-10-CM | POA: Diagnosis not present

## 2018-03-17 DIAGNOSIS — N390 Urinary tract infection, site not specified: Secondary | ICD-10-CM | POA: Diagnosis present

## 2018-03-17 DIAGNOSIS — I480 Paroxysmal atrial fibrillation: Secondary | ICD-10-CM | POA: Diagnosis present

## 2018-03-17 DIAGNOSIS — R55 Syncope and collapse: Secondary | ICD-10-CM | POA: Diagnosis present

## 2018-03-17 DIAGNOSIS — R001 Bradycardia, unspecified: Secondary | ICD-10-CM | POA: Diagnosis present

## 2018-03-17 DIAGNOSIS — G4733 Obstructive sleep apnea (adult) (pediatric): Secondary | ICD-10-CM | POA: Diagnosis present

## 2018-03-17 DIAGNOSIS — R42 Dizziness and giddiness: Secondary | ICD-10-CM

## 2018-03-17 DIAGNOSIS — G629 Polyneuropathy, unspecified: Secondary | ICD-10-CM | POA: Diagnosis present

## 2018-03-17 DIAGNOSIS — Z7901 Long term (current) use of anticoagulants: Secondary | ICD-10-CM | POA: Diagnosis not present

## 2018-03-17 LAB — URINALYSIS, ROUTINE W REFLEX MICROSCOPIC
Bacteria, UA: NONE SEEN
Bilirubin Urine: NEGATIVE
Glucose, UA: NEGATIVE mg/dL
Hgb urine dipstick: NEGATIVE
Ketones, ur: NEGATIVE mg/dL
Nitrite: NEGATIVE
PH: 6 (ref 5.0–8.0)
Protein, ur: NEGATIVE mg/dL
Specific Gravity, Urine: 1.01 (ref 1.005–1.030)

## 2018-03-17 LAB — CBC WITH DIFFERENTIAL/PLATELET
Abs Immature Granulocytes: 0.02 10*3/uL (ref 0.00–0.07)
Basophils Absolute: 0 10*3/uL (ref 0.0–0.1)
Basophils Relative: 1 %
EOS ABS: 0.1 10*3/uL (ref 0.0–0.5)
Eosinophils Relative: 3 %
HCT: 28.8 % — ABNORMAL LOW (ref 36.0–46.0)
Hemoglobin: 8.9 g/dL — ABNORMAL LOW (ref 12.0–15.0)
Immature Granulocytes: 1 %
Lymphocytes Relative: 30 %
Lymphs Abs: 1.3 10*3/uL (ref 0.7–4.0)
MCH: 31.1 pg (ref 26.0–34.0)
MCHC: 30.9 g/dL (ref 30.0–36.0)
MCV: 100.7 fL — ABNORMAL HIGH (ref 80.0–100.0)
Monocytes Absolute: 0.6 10*3/uL (ref 0.1–1.0)
Monocytes Relative: 14 %
Neutro Abs: 2.3 10*3/uL (ref 1.7–7.7)
Neutrophils Relative %: 51 %
Platelets: 145 10*3/uL — ABNORMAL LOW (ref 150–400)
RBC: 2.86 MIL/uL — ABNORMAL LOW (ref 3.87–5.11)
RDW: 13.2 % (ref 11.5–15.5)
WBC: 4.4 10*3/uL (ref 4.0–10.5)
nRBC: 0 % (ref 0.0–0.2)

## 2018-03-17 LAB — COMPREHENSIVE METABOLIC PANEL
ALT: 16 U/L (ref 0–44)
AST: 31 U/L (ref 15–41)
Albumin: 2.9 g/dL — ABNORMAL LOW (ref 3.5–5.0)
Alkaline Phosphatase: 35 U/L — ABNORMAL LOW (ref 38–126)
Anion gap: 6 (ref 5–15)
BUN: 15 mg/dL (ref 8–23)
CALCIUM: 7.4 mg/dL — AB (ref 8.9–10.3)
CO2: 23 mmol/L (ref 22–32)
Chloride: 113 mmol/L — ABNORMAL HIGH (ref 98–111)
Creatinine, Ser: 0.7 mg/dL (ref 0.44–1.00)
GFR calc Af Amer: >60 mL/min (ref >60)
GFR calc non Af Amer: >60 mL/min (ref >60)
Glucose, Bld: 80 mg/dL (ref 70–99)
Potassium: 3.9 mmol/L (ref 3.5–5.1)
Sodium: 142 mmol/L (ref 135–145)
TOTAL PROTEIN: 4.9 g/dL — AB (ref 6.5–8.1)
Total Bilirubin: 0.7 mg/dL (ref 0.3–1.2)

## 2018-03-17 LAB — BRAIN NATRIURETIC PEPTIDE: B Natriuretic Peptide: 83.5 pg/mL (ref 0.0–100.0)

## 2018-03-17 LAB — I-STAT CG4 LACTIC ACID, ED: Lactic Acid, Venous: 1.39 mmol/L (ref 0.5–1.9)

## 2018-03-17 LAB — I-STAT TROPONIN, ED: Troponin i, poc: 0 ng/mL (ref 0.00–0.08)

## 2018-03-17 MED ORDER — TIZANIDINE HCL 4 MG PO TABS: 4.0000 mg | ORAL_TABLET | Freq: Four times a day (QID) | ORAL | Status: DC | PRN

## 2018-03-17 MED ORDER — FLECAINIDE ACETATE 50 MG PO TABS
50.0000 mg | ORAL_TABLET | Freq: Two times a day (BID) | ORAL | Status: DC
  Administered 2018-03-17: 50 mg via ORAL
  Filled 2018-03-17 (×2): qty 1

## 2018-03-17 MED ORDER — SUCRALFATE 1 G PO TABS
1.0000 g | ORAL_TABLET | Freq: Three times a day (TID) | ORAL | Status: DC
  Administered 2018-03-17 – 2018-03-18 (×3): 1 g via ORAL
  Filled 2018-03-17 (×3): qty 1

## 2018-03-17 MED ORDER — CALCIUM 600 MG PO TABS: 600.0000 mg | ORAL_TABLET | Freq: Two times a day (BID) | ORAL | Status: DC

## 2018-03-17 MED ORDER — GLUCOSAMINE-CHONDROITIN PO CAPS: ORAL_CAPSULE | Freq: Every day | ORAL | Status: DC

## 2018-03-17 MED ORDER — ATORVASTATIN CALCIUM 10 MG PO TABS
10.0000 mg | ORAL_TABLET | Freq: Every day | ORAL | Status: DC
  Filled 2018-03-17: qty 1

## 2018-03-17 MED ORDER — NITROGLYCERIN 0.4 MG/SPRAY TL SOLN: 1.0000 | Status: DC | PRN

## 2018-03-17 MED ORDER — LORATADINE 10 MG PO TABS
10.0000 mg | ORAL_TABLET | Freq: Every day | ORAL | Status: DC
  Administered 2018-03-18: 10 mg via ORAL
  Filled 2018-03-17: qty 1

## 2018-03-17 MED ORDER — LOSARTAN POTASSIUM 50 MG PO TABS: 50.0000 mg | ORAL_TABLET | Freq: Every day | ORAL | Status: DC

## 2018-03-17 MED ORDER — CALCIUM CARBONATE 1250 (500 CA) MG PO TABS
1.0000 | ORAL_TABLET | Freq: Two times a day (BID) | ORAL | Status: DC
  Administered 2018-03-18: 500 mg via ORAL
  Filled 2018-03-17: qty 1

## 2018-03-17 MED ORDER — FAMOTIDINE 20 MG PO TABS
20.0000 mg | ORAL_TABLET | Freq: Two times a day (BID) | ORAL | Status: DC
  Administered 2018-03-17 – 2018-03-18 (×2): 20 mg via ORAL
  Filled 2018-03-17 (×2): qty 1

## 2018-03-17 MED ORDER — VITAMIN C 500 MG PO TABS
1000.0000 mg | ORAL_TABLET | Freq: Every day | ORAL | Status: DC
  Administered 2018-03-18: 1000 mg via ORAL
  Filled 2018-03-17: qty 2

## 2018-03-17 MED ORDER — ADULT MULTIVITAMIN W/MINERALS CH
1.0000 | ORAL_TABLET | Freq: Every day | ORAL | Status: DC
  Administered 2018-03-18: 1 via ORAL
  Filled 2018-03-17: qty 1

## 2018-03-17 MED ORDER — PANTOPRAZOLE SODIUM 40 MG PO TBEC
40.0000 mg | DELAYED_RELEASE_TABLET | Freq: Every day | ORAL | Status: DC
  Administered 2018-03-18: 40 mg via ORAL
  Filled 2018-03-17: qty 1

## 2018-03-17 MED ORDER — OMEGA-3-ACID ETHYL ESTERS 1 G PO CAPS
1.0000 g | ORAL_CAPSULE | Freq: Every day | ORAL | Status: DC
  Administered 2018-03-18: 1 g via ORAL
  Filled 2018-03-17: qty 1

## 2018-03-17 MED ORDER — CRANBERRY 500 MG PO CAPS: ORAL_CAPSULE | Freq: Every day | ORAL | Status: DC

## 2018-03-17 MED ORDER — APIXABAN 5 MG PO TABS
5.0000 mg | ORAL_TABLET | Freq: Two times a day (BID) | ORAL | Status: DC
  Administered 2018-03-17 – 2018-03-18 (×2): 5 mg via ORAL
  Filled 2018-03-17 (×2): qty 1

## 2018-03-17 MED ORDER — TRIMETHOPRIM 100 MG PO TABS
100.0000 mg | ORAL_TABLET | Freq: Every day | ORAL | Status: DC
  Administered 2018-03-18: 100 mg via ORAL
  Filled 2018-03-17: qty 1

## 2018-03-17 MED ORDER — CYCLOSPORINE 0.05 % OP EMUL
1.0000 [drp] | Freq: Two times a day (BID) | OPHTHALMIC | Status: DC
  Administered 2018-03-17 – 2018-03-18 (×2): 1 [drp] via OPHTHALMIC
  Filled 2018-03-17 (×2): qty 1

## 2018-03-17 MED ORDER — LUTEIN-ZEAXANTHIN 25-5 MG PO CAPS: 1.0000 | ORAL_CAPSULE | Freq: Every day | ORAL | Status: DC

## 2018-03-17 MED ORDER — SODIUM CHLORIDE 0.9 % IV SOLN
INTRAVENOUS | Status: DC
  Administered 2018-03-17 – 2018-03-18 (×2): via INTRAVENOUS

## 2018-03-17 MED ORDER — ONDANSETRON HCL 4 MG/2ML IJ SOLN: 4.0000 mg | Freq: Four times a day (QID) | INTRAMUSCULAR | Status: DC | PRN

## 2018-03-17 MED ORDER — BUPROPION HCL ER (XL) 150 MG PO TB24
150.0000 mg | ORAL_TABLET | Freq: Every day | ORAL | Status: DC
  Administered 2018-03-18: 150 mg via ORAL
  Filled 2018-03-17: qty 1

## 2018-03-17 MED ORDER — ACETAMINOPHEN 500 MG PO TABS
1000.0000 mg | ORAL_TABLET | Freq: Two times a day (BID) | ORAL | Status: DC
  Administered 2018-03-17 – 2018-03-18 (×2): 1000 mg via ORAL
  Filled 2018-03-17 (×3): qty 2

## 2018-03-17 MED ORDER — VITAMIN D3 25 MCG (1000 UNIT) PO TABS
1000.0000 [IU] | ORAL_TABLET | Freq: Every day | ORAL | Status: DC
  Administered 2018-03-18: 1000 [IU] via ORAL
  Filled 2018-03-17: qty 1

## 2018-03-17 MED ORDER — ONDANSETRON HCL 4 MG PO TABS: 4.0000 mg | ORAL_TABLET | Freq: Four times a day (QID) | ORAL | Status: DC | PRN

## 2018-03-17 MED ORDER — SIMETHICONE 80 MG PO CHEW: 80.0000 mg | CHEWABLE_TABLET | Freq: Four times a day (QID) | ORAL | Status: DC | PRN

## 2018-03-17 MED ORDER — DULOXETINE HCL 30 MG PO CPEP
30.0000 mg | ORAL_CAPSULE | Freq: Two times a day (BID) | ORAL | Status: DC
  Administered 2018-03-17 – 2018-03-18 (×2): 30 mg via ORAL
  Filled 2018-03-17 (×2): qty 1

## 2018-03-17 MED ORDER — THYROID 60 MG PO TABS
60.0000 mg | ORAL_TABLET | Freq: Every day | ORAL | Status: DC
  Administered 2018-03-18: 60 mg via ORAL
  Filled 2018-03-17: qty 1

## 2018-03-17 MED ORDER — LIPOIC ACID 150 MG PO CAPS: 600.0000 mg | ORAL_CAPSULE | Freq: Two times a day (BID) | ORAL | Status: DC

## 2018-03-17 MED ORDER — SODIUM CHLORIDE 0.9 % IV BOLUS
500.0000 mL | Freq: Once | INTRAVENOUS | Status: AC
  Administered 2018-03-17: 500 mL via INTRAVENOUS

## 2018-03-17 MED ORDER — MELATONIN 5 MG PO TABS
5.0000 mg | ORAL_TABLET | Freq: Every day | ORAL | Status: DC
  Administered 2018-03-17: 5 mg via ORAL
  Filled 2018-03-17: qty 1

## 2018-03-17 MED ORDER — OMEGA-3 1000 MG PO CAPS: 1000.0000 mg | ORAL_CAPSULE | Freq: Every day | ORAL | Status: DC

## 2018-03-17 NOTE — ED Notes (Signed)
ED TO INPATIENT HANDOFF REPORT  Name/Age/Gender Cynthia [name] 77 y.o. female  Code Status Code Status History    Date Active Date Inactive Code Status Order ID Comments User Context   11/06/2017 1409 11/11/2017 1630 Full Code [id number]  Tarry Kos, MD ED   10/27/2017 2127 10/29/2017 1622 Full Code [id number]  Rise Patience, MD ED   11/23/2016 2306 11/25/2016 1721 Full Code [id number]  Mariel Aloe, MD ED      Home/SNF/Other Home  Chief Complaint Orthostatic hypertension  Level of Care/Admitting Diagnosis ED Disposition    ED Disposition Condition [facility] Area: [facility] [[id number]]  Level of Care: Telemetry [5]  Admit to tele based on following criteria: Eval of Syncope  Diagnosis: Hypotension [793903]  Admitting Physician: Elwyn Reach [[id number]]  Attending Physician: Elwyn Reach [[id number]]  Estimated length of stay: past midnight tomorrow  Certification:: I certify this patient will need inpatient services for at least 2 midnights  PT Class (Do Not Modify): Inpatient [101]  PT Acc Code (Do Not Modify): Private [1]       Medical History Past Medical History:  Diagnosis Date  . A-fib ([place])   . Anemia   . Arthritis    "joints" (11/10/2017)  . GERD (gastroesophageal reflux disease)   . Hearing loss   . History of blood transfusion 10/2017  . Hypertension   . Hyperthyroidism   . Melanoma ([place])    "cut off my back"  . OSA on CPAP   . Sinus headache     Allergies No Known Allergies  IV Location/Drains/Wounds Patient Lines/Drains/Airways Status   Active Line/Drains/Airways    Name:   Placement date:   Placement time:   Site:   Days:   Peripheral IV 03/17/18 Right Hand   03/17/18    1600    Hand   less than 1          Labs/Imaging Results for orders placed or performed during the hospital encounter of 03/17/18 (from the past 48 hour(s))  Comprehensive metabolic panel     Status: Abnormal   Collection  Time: 03/17/18  4:13 PM  Result Value Ref Range   Sodium 142 135 - 145 mmol/L   Potassium 3.9 3.5 - 5.1 mmol/L   Chloride 113 (H) 98 - 111 mmol/L   CO2 23 22 - 32 mmol/L   Glucose, Bld 80 70 - 99 mg/dL   BUN 15 8 - 23 mg/dL   Creatinine, Ser 0.70 0.44 - 1.00 mg/dL   Calcium 7.4 (L) 8.9 - 10.3 mg/dL   Total Protein 4.9 (L) 6.5 - 8.1 g/dL   Albumin 2.9 (L) 3.5 - 5.0 g/dL   AST 31 15 - 41 U/L   ALT 16 0 - 44 U/L   Alkaline Phosphatase 35 (L) 38 - 126 U/L   Total Bilirubin 0.7 0.3 - 1.2 mg/dL   GFR calc non Af Amer >60 >60 mL/min   GFR calc Af Amer >60 >60 mL/min   Anion gap 6 5 - 15    Comment: Performed at [facility], [place] [address], [place], [place] [address]  Brain natriuretic peptide     Status: None   Collection Time: 03/17/18  4:13 PM  Result Value Ref Range   B Natriuretic Peptide 83.5 0.0 - 100.0 pg/mL    Comment: Performed at [facility], [place] [address], [place], [place] [address]  CBC with Differential  Status: Abnormal   Collection Time: 03/17/18  4:13 PM  Result Value Ref Range   WBC 4.4 4.0 - 10.5 K/uL   RBC 2.86 (L) 3.87 - 5.11 MIL/uL   Hemoglobin 8.9 (L) 12.0 - 15.0 g/dL   HCT 28.8 (L) 36.0 - 46.0 %   MCV 100.7 (H) 80.0 - 100.0 fL   MCH 31.1 26.0 - 34.0 pg   MCHC 30.9 30.0 - 36.0 g/dL   RDW 13.2 11.5 - 15.5 %   Platelets 145 (L) 150 - 400 K/uL   nRBC 0.0 0.0 - 0.2 %   Neutrophils Relative % 51 %   Neutro Abs 2.3 1.7 - 7.7 K/uL   Lymphocytes Relative 30 %   Lymphs Abs 1.3 0.7 - 4.0 K/uL   Monocytes Relative 14 %   Monocytes Absolute 0.6 0.1 - 1.0 K/uL   Eosinophils Relative 3 %   Eosinophils Absolute 0.1 0.0 - 0.5 K/uL   Basophils Relative 1 %   Basophils Absolute 0.0 0.0 - 0.1 K/uL   Immature Granulocytes 1 %   Abs Immature Granulocytes 0.02 0.00 - 0.07 K/uL    Comment: Performed at [facility], [place] [address] [place], [place] [address]  I-stat troponin, ED     Status: None   Collection  Time: 03/17/18  4:23 PM  Result Value Ref Range   Troponin i, poc 0.00 0.00 - 0.08 ng/mL   Comment 3            Comment: Due to the release kinetics of cTnI, a negative result within the first hours of the onset of symptoms does not rule out myocardial infarction with certainty. If myocardial infarction is still suspected, repeat the test at appropriate intervals.   I-Stat CG4 Lactic Acid, ED     Status: None   Collection Time: 03/17/18  4:25 PM  Result Value Ref Range   Lactic Acid, Venous 1.39 0.5 - 1.9 mmol/L  Urinalysis, Routine w reflex microscopic     Status: Abnormal   Collection Time: 03/17/18  5:36 PM  Result Value Ref Range   Color, Urine YELLOW YELLOW   APPearance CLEAR CLEAR   Specific Gravity, Urine 1.010 1.005 - 1.030   pH 6.0 5.0 - 8.0   Glucose, UA NEGATIVE NEGATIVE mg/dL   Hgb urine dipstick NEGATIVE NEGATIVE   Bilirubin Urine NEGATIVE NEGATIVE   Ketones, ur NEGATIVE NEGATIVE mg/dL   Protein, ur NEGATIVE NEGATIVE mg/dL   Nitrite NEGATIVE NEGATIVE   Leukocytes, UA TRACE (A) NEGATIVE   RBC / HPF 0-5 0 - 5 RBC/hpf   WBC, UA 0-5 0 - 5 WBC/hpf   Bacteria, UA NONE SEEN NONE SEEN   Squamous Epithelial / LPF 0-5 0 - 5    Comment: Performed at [facility], [place] [address] [place], [place] [address]   Dg Chest 2 View  Result Date: 03/17/2018 CLINICAL DATA:  Recurrent UTIs, chest pain, shortness of breath, near syncopal episode EXAM: CHEST - 2 VIEW COMPARISON:  03/16/2018 FINDINGS: Normal heart size and vascularity. Minor basilar atelectasis. No focal pneumonia, collapse or consolidation. Negative for edema, significant effusion or pneumothorax. Trachea midline. Aorta atherosclerotic. Degenerative changes noted of the spine with a mild scoliosis. IMPRESSION: Basilar atelectasis. Thoracic atherosclerosis No definite acute chest process Electronically Signed   By: Jerilynn Mages.  Shick M.D.   On: 03/17/2018 17:19   Dg Chest 2 View  Result Date: 03/16/2018 CLINICAL  DATA:  77 year old female with a history of syncope EXAM: CHEST -  2 VIEW COMPARISON:  11/09/2017 FINDINGS: Cardiomediastinal silhouette unchanged in size and contour. No evidence of central vascular congestion. Low lung volumes with coarsened interstitial markings. Stigmata of emphysema, with increased retrosternal airspace, flattened hemidiaphragms, increased AP diameter, and hyperinflation on the AP view. No pleural effusion or pneumothorax. No confluent airspace disease. Vascular calcifications. No displaced fracture IMPRESSION: Chronic lung changes and evidence of emphysema without evidence of acute cardiopulmonary disease. Electronically Signed   By: Corrie Mckusick D.O.   On: 03/16/2018 15:06   EKG Interpretation  Date/Time:  Thursday March 17 2018 16:25:05 EST Ventricular Rate:  61 PR Interval:    QRS Duration: 112 QT Interval:  490 QTC Calculation: 494 R Axis:   -63 Text Interpretation:  Sinus rhythm LAD, consider left anterior fascicular block Low voltage, precordial leads RSR' in V1 or V2, right VCD or RVH Borderline prolonged QT interval No STEMI.  Confirmed by Nanda Quinton [phone]) on 03/17/2018 4:44:16 PM   Pending Labs Unresulted Labs (From admission, onward)    Start     Ordered   03/17/18 1614  Urine culture  ONCE - STAT,   STAT    Question:  Patient immune status  Answer:  Normal   03/17/18 1614   Signed and Held  Comprehensive metabolic panel  Tomorrow morning,   R     Signed and Held   Signed and Held  CBC  Tomorrow morning,   R     Signed and Held          Vitals/Pain Today's Vitals   03/17/18 1815 03/17/18 1830 03/17/18 1845 03/17/18 1900  BP: 132/79 (!) 151/83 (!) 148/79 (!) 146/79  Pulse: 71 76 78 73  Resp: (!) 21 14 (!) 21 17  Temp:      TempSrc:      SpO2: 94% 98% (!) 87% 98%    Isolation Precautions No active isolations  Medications Medications  sodium chloride 0.9 % bolus 500 mL (0 mLs Intravenous Stopped 03/17/18 1834)    Mobility walks with  person assist

## 2018-03-17 NOTE — ED Triage Notes (Addendum)
Transported by [facility] from home-- patient checked her BP at home and it read low so she called EMS. +dizziness with standing and recently diagnosed with UTI (taking oral antibiotic medication--only new change in medication). AAO x 4. +orthostatic vitals with EMS. IV in place, 500 cc of NS administered.

## 2018-03-17 NOTE — H&P (Signed)
History and Physical   Cynthia [name] TIW:[id number] DOB: 1941/03/12 DOA: 03/17/2018  Referring MD/NP/PA: Dr. Laverta Baltimore  PCP: Deland Pretty, MD   Outpatient Specialists: None  Patient coming from: Home  Chief Complaint: Dizziness and low blood pressure  HPI: Cynthia [name] is a 77 y.o. female with medical history significant of paroxysmal atrial fibrillation, recent GI bleed, recurrent postural hypotension with presyncope multiple times this year including yesterday, previous UTI, hypertension, hyperlipidemia who came to the ER after experiencing another episode of hypotension.  Patient was very dizzy and almost passed out.  She is on multiple medications for blood pressure and A. fib including diltiazem amlodipine as well as losartan.  When she had this episode in July the losartan dose was decreased from 100mg  to 50 mg.  At home today blood pressure was found to be in the 25K systolic.  She had recent UTI for which she was on Cipro but then given Keflex only yesterday.  Symptoms appear to be resolving.  She denied any nausea vomiting or diarrhea.  She is not orthostatic in the ER.  Patient has received bolus of IV fluids and blood pressure is currently corrected.  Due to recurrence of her syncope and presyncope she is being admitted for work-up of and monitoring blood pressure.  ED Course: Temperature 97.5, blood pressure is currently 103/63 pulse 59 respiratory 24 oxygen sat 95% room air.  White count is 4.4 hemoglobin 8.9 and platelet 145.  Chloride is 113 calcium 7.4 but creatinine 0.70.  Urinalysis today is normal.  EKG showed no significant finding and chest x-ray showed no acute finding.  Review of Systems: As per HPI otherwise 10 point review of systems negative.    Past Medical History:  Diagnosis Date  . A-fib ([place])   . Anemia   . Arthritis    "joints" (11/10/2017)  . GERD (gastroesophageal reflux disease)   . Hearing loss   . History of blood transfusion 10/2017  . Hypertension     . Hyperthyroidism   . Melanoma ([place])    "cut off my back"  . OSA on CPAP   . Sinus headache     Past Surgical History:  Procedure Laterality Date  . ABDOMINAL HYSTERECTOMY     "partial"  . APPENDECTOMY    . BIOPSY  11/17/2017   Procedure: BIOPSY;  Surgeon: Clarene Essex, MD;  Location: [facility];  Service: Endoscopy;;  . COLONOSCOPY WITH PROPOFOL N/A 11/17/2017   Procedure: COLONOSCOPY WITH PROPOFOL;  Surgeon: Clarene Essex, MD;  Location: [facility];  Service: Endoscopy;  Laterality: N/A;  . CRANIECTOMY FOR EXCISION OF ACOUSTIC NEUROMA    . ESOPHAGOGASTRODUODENOSCOPY (EGD) WITH PROPOFOL N/A 10/28/2017   Procedure: ESOPHAGOGASTRODUODENOSCOPY (EGD) WITH PROPOFOL;  Surgeon: Clarene Essex, MD;  Location: [facility];  Service: Endoscopy;  Laterality: N/A;  . KNEE ARTHROSCOPY Left   . LEFT HEART CATH AND CORONARY ANGIOGRAPHY N/A 11/24/2016   Procedure: LEFT HEART CATH AND CORONARY ANGIOGRAPHY;  Surgeon: Jettie Booze, MD;  Location: [facility];  Service: Cardiovascular;  Laterality: N/A;  . MELANOMA EXCISION     "off my back"  . NASAL SINUS SURGERY    . TONSILLECTOMY       reports that she quit smoking about 39 years ago. Her smoking use included cigarettes. She quit after 2.00 years of use. She has never used smokeless tobacco. She reports that she drinks alcohol. She reports that she does not use drugs.  No Known Allergies  Family History  Problem Relation Age of Onset  . Congestive Heart Failure Mother   . Heart disease Father        History of heart attacks at a later age.       Prior to Admission medications   Medication Sig Start Date End Date Taking? Authorizing Provider  acetaminophen (TYLENOL) 500 MG tablet Take 1,000 mg by mouth 2 (two) times daily.   Yes [provider]  Alpha-Lipoic Acid (LIPOIC ACID PO) Take 600 mg by mouth 2 (two) times daily.    Yes [provider]  amLODipine (NORVASC) 5 MG tablet Take 5 mg by mouth daily. 01/24/18   Yes [provider]  atorvastatin (LIPITOR) 10 MG tablet TAKE 1 TABLET BY MOUTH EVERY DAY AT 6PM *NEED OFFICE VISIT* Patient taking differently: Take 10 mg by mouth daily at 6 PM.  03/07/18  Yes Crenshaw, Denice Bors, MD  buPROPion (WELLBUTRIN XL) 150 MG 24 hr tablet Take 150 mg by mouth daily.  02/17/18  Yes [provider]  CALCIUM PO Take 600 mg by mouth 2 (two) times daily.    Yes [provider]  cephALEXin (KEFLEX) 500 MG capsule Take 1 capsule (500 mg total) by mouth 4 (four) times daily. 03/16/18  Yes Delo, Nathaneil Canary, MD  cetirizine (ZYRTEC) 10 MG tablet Take 10 mg by mouth daily.   Yes [provider]  cholecalciferol (VITAMIN D) 1000 UNITS tablet Take 1,000 Units by mouth daily.   Yes [provider]  CRANBERRY CONCENTRATE PO Take 650 mg by mouth daily.    Yes [provider]  cycloSPORINE (RESTASIS) 0.05 % ophthalmic emulsion Place 1 drop into both eyes 2 (two) times daily.   Yes [provider]  diltiazem (CARDIZEM CD) 120 MG 24 hr capsule TAKE 1 CAPSULE BY MOUTH EVERY DAY *NEED OFFICE VISIT* Patient taking differently: Take 120 mg by mouth daily.  03/07/18  Yes Lelon Perla, MD  DULoxetine (CYMBALTA) 30 MG capsule TAKE 1 CAPSULE (30 MG TOTAL) BY MOUTH 2 (TWO) TIMES DAILY. 09/09/17  Yes Sater, Nanine Means, MD  ELIQUIS 5 MG TABS tablet Take 5 mg by mouth 2 (two) times daily. 03/06/18  Yes [provider]  flecainide (TAMBOCOR) 50 MG tablet Take 50 mg by mouth 2 (two) times daily. 02/22/18  Yes [provider]  GLUCOSAMINE-CHONDROITIN PO Take 2 tablets by mouth daily with lunch. Glucosamine 1500mg  Chondroitin 1200mg    Yes [provider]  Lutein-Zeaxanthin 25-5 MG CAPS Take 1 tablet by mouth daily.   Yes [provider]  Melatonin 5 MG TABS Take 5 mg by mouth at bedtime.   Yes [provider]  Multiple Vitamin (MULTIVITAMIN WITH MINERALS) TABS tablet Take 1 tablet by mouth daily.   Yes  [provider]  nitroGLYCERIN (NITROLINGUAL) 0.4 MG/SPRAY spray Place 1 spray under the tongue every 5 (five) minutes x 3 doses as needed for chest pain.   Yes [provider]  Omega-3 1000 MG CAPS Take 1,000 mg by mouth daily.   Yes [provider]  pantoprazole (PROTONIX) 40 MG tablet Take 40 mg by mouth daily before breakfast.  06/12/14  Yes [provider]  simethicone (MYLICON) 80 MG chewable tablet Chew 80 mg by mouth every 6 (six) hours as needed for flatulence.   Yes [provider]  sucralfate (CARAFATE) 1 g tablet Take 1 g by mouth 4 (four) times daily -  with meals and at bedtime.  03/06/18  Yes [provider]  thyroid (  ARMOUR) 60 MG tablet Take 60 mg by mouth daily before breakfast. 11/07/17  Yes [provider]  trimethoprim (TRIMPEX) 100 MG tablet Take 100 mg by mouth daily. 02/01/18  Yes [provider]  vitamin C (ASCORBIC ACID) 500 MG tablet Take 1,000 mg by mouth daily.    Yes [provider]  losartan (COZAAR) 50 MG tablet Take 1 tablet (50 mg total) by mouth daily. 11/12/17 12/12/17  [name], [name], MD  ranitidine (ZANTAC) 150 MG tablet Take 1 tablet (150 mg total) by mouth 2 (two) times daily. 12/22/16 11/06/17  Duffy Bruce, MD  tiZANidine (ZANAFLEX) 4 MG tablet TAKE 1 TABLET BY MOUTH THREE TIMES A DAY AS NEEDED Patient taking differently: TAKE 1 TABLET BY MOUTH DAILY AS NEEDED SPASMS 08/04/17   [name], MD    Physical Exam: Vitals:   03/17/18 1726 03/17/18 1730 03/17/18 1745 03/17/18 1801  BP: (!) 121/106 135/76 138/75 95/82  Pulse: 70 67 75 74  Resp: 15 17 14 20   Temp:      TempSrc:      SpO2: 95% 95% 96% 95%      Constitutional: NAD, calm, comfortable Vitals:   03/17/18 1726 03/17/18 1730 03/17/18 1745 03/17/18 1801  BP: (!) 121/106 135/76 138/75 95/82  Pulse: 70 67 75 74  Resp: 15 17 14 20   Temp:      TempSrc:      SpO2: 95% 95% 96% 95%   Eyes: PERRL, lids and  conjunctivae normal ENMT: Mucous membranes are moist. Posterior pharynx clear of any exudate or lesions.Normal dentition.  Neck: normal, supple, no masses, no thyromegaly Respiratory: clear to auscultation bilaterally, no wheezing, no crackles. Normal respiratory effort. No accessory muscle use.  Cardiovascular: Regular rate and rhythm, no murmurs / rubs / gallops. No extremity edema. 2+ pedal pulses. No carotid bruits.  Abdomen: no tenderness, no masses palpated. No hepatosplenomegaly. Bowel sounds positive.  Musculoskeletal: no clubbing / cyanosis. No joint deformity upper and lower extremities. Good ROM, no contractures. Normal muscle tone.  Skin: no rashes, lesions, ulcers. No induration Neurologic: CN 2-12 grossly intact. Sensation intact, DTR normal. Strength 5/5 in all 4.  Psychiatric: Normal judgment and insight. Alert and oriented x 3. Normal mood.     Labs on Admission: I have personally reviewed following labs and imaging studies  CBC: Recent Labs  Lab 03/16/18 1327 03/17/18 1613  WBC 5.4 4.4  NEUTROABS 3.4 2.3  HGB 9.8* 8.9*  HCT 33.1* 28.8*  MCV 99.4 100.7*  PLT 165 962*   Basic Metabolic Panel: Recent Labs  Lab 03/16/18 1327 03/17/18 1613  NA 140 142  K 3.6 3.9  CL 106 113*  CO2 23 23  GLUCOSE 104* 80  BUN 20 15  CREATININE 0.70 0.70  CALCIUM 8.4* 7.4*   GFR: CrCl cannot be calculated (Unknown ideal weight.). Liver Function Tests: Recent Labs  Lab 03/16/18 1327 03/17/18 1613  AST 24 31  ALT 23 16  ALKPHOS 41 35*  BILITOT 0.4 0.7  PROT 5.4* 4.9*  ALBUMIN 3.1* 2.9*   No results for input(s): LIPASE, AMYLASE in the last 168 hours. No results for input(s): AMMONIA in the last 168 hours. Coagulation Profile: No results for input(s): INR, PROTIME in the last 168 hours. Cardiac Enzymes: No results for input(s): CKTOTAL, CKMB, CKMBINDEX, TROPONINI in the last 168 hours. BNP (last 3 results) No results for input(s): PROBNP in the last 8760  hours. HbA1C: No results for input(s): HGBA1C in the last 72  hours. CBG: No results for input(s): GLUCAP in the last 168 hours. Lipid Profile: No results for input(s): CHOL, HDL, LDLCALC, TRIG, CHOLHDL, LDLDIRECT in the last 72 hours. Thyroid Function Tests: No results for input(s): TSH, T4TOTAL, FREET4, T3FREE, THYROIDAB in the last 72 hours. Anemia Panel: No results for input(s): VITAMINB12, FOLATE, FERRITIN, TIBC, IRON, RETICCTPCT in the last 72 hours. Urine analysis:    Component Value Date/Time   COLORURINE YELLOW 03/17/2018 1736   APPEARANCEUR CLEAR 03/17/2018 1736   LABSPEC 1.010 03/17/2018 1736   PHURINE 6.0 03/17/2018 1736   GLUCOSEU NEGATIVE 03/17/2018 1736   HGBUR NEGATIVE 03/17/2018 1736   BILIRUBINUR NEGATIVE 03/17/2018 1736   KETONESUR NEGATIVE 03/17/2018 1736   PROTEINUR NEGATIVE 03/17/2018 1736   NITRITE NEGATIVE 03/17/2018 1736   LEUKOCYTESUR TRACE (A) 03/17/2018 1736   Sepsis Labs: @LABRCNTIP (procalcitonin:4,lacticidven:4) ) Recent Results (from the past 240 hour(s))  Urine culture     Status: Abnormal (Preliminary result)   Collection Time: 03/16/18  1:31 PM  Result Value Ref Range Status   Specimen Description URINE, RANDOM  Final   Special Requests   Final    NONE Performed at [facility], [place] [address], [place], [place] [address]    Culture >=100,000 COLONIES/mL ENTEROCOCCUS FAECALIS (A)  Final   Report Status PENDING  Incomplete     Radiological Exams on Admission: Dg Chest 2 View  Result Date: 03/17/2018 CLINICAL DATA:  Recurrent UTIs, chest pain, shortness of breath, near syncopal episode EXAM: CHEST - 2 VIEW COMPARISON:  03/16/2018 FINDINGS: Normal heart size and vascularity. Minor basilar atelectasis. No focal pneumonia, collapse or consolidation. Negative for edema, significant effusion or pneumothorax. Trachea midline. Aorta atherosclerotic. Degenerative changes noted of the spine with a mild scoliosis. IMPRESSION: Basilar atelectasis.  Thoracic atherosclerosis No definite acute chest process Electronically Signed   By: Jerilynn Mages.  Shick M.D.   On: 03/17/2018 17:19   Dg Chest 2 View  Result Date: 03/16/2018 CLINICAL DATA:  77 year old female with a history of syncope EXAM: CHEST - 2 VIEW COMPARISON:  11/09/2017 FINDINGS: Cardiomediastinal silhouette unchanged in size and contour. No evidence of central vascular congestion. Low lung volumes with coarsened interstitial markings. Stigmata of emphysema, with increased retrosternal airspace, flattened hemidiaphragms, increased AP diameter, and hyperinflation on the AP view. No pleural effusion or pneumothorax. No confluent airspace disease. Vascular calcifications. No displaced fracture IMPRESSION: Chronic lung changes and evidence of emphysema without evidence of acute cardiopulmonary disease. Electronically Signed   By: Corrie Mckusick D.O.   On: 03/16/2018 15:06    EKG: Independently reviewed.  It shows normal sinus rhythm with a rate of 61, evidence of left anterior fascicular block otherwise no ST changes  Assessment/Plan Principal Problem:   Postural dizziness with presyncope Active Problems:   OSA (obstructive sleep apnea)   Essential hypertension, benign   Polyneuropathy   Hyperlipidemia   GERD (gastroesophageal reflux disease)   Hypotension   PAF (paroxysmal atrial fibrillation) ([place])     # Presyncope: Patient has recurrent presyncope with hypotension and bradycardia.  She might require EP involvement.  Blood pressure was very low.  She previously was diagnosed with vasovagal syncope which may be playing a role however her systolic is still up and down.  We will admit her and start her on IV fluids.  Hold some of the antihypertensives for now.  Keep her on telemetry.  Consider EP evaluation.  #2 Recent UTI: Complete antibiotics.  #3 Obstructive sleep apnea: CPAP at night.  #4 GERD: Continue with  PPIs  #5 Paroxysmal atrial fibrillation: Currently on Cardizem as well as  Eliquis.  Resume anticoagulation.  #6 polyneuropathy: Continue with home regimen.  #7 recent GI bleed: Hemoglobin appears stable.  We will watch closely as patient is back on her anticoagulation.  She had recent colonoscopy.   DVT prophylaxis: Eliquis Code Status: Full Code  Family Communication: Husband  Disposition Plan: Home  Consults called: None  Admission status: inpatient   Severity of Illness: The appropriate patient status for this patient is INPATIENT. Inpatient status is judged to be reasonable and necessary in order to provide the required intensity of service to ensure the patient's safety. The patient's presenting symptoms, physical exam findings, and initial radiographic and laboratory data in the context of their chronic comorbidities is felt to place them at high risk for further clinical deterioration. Furthermore, it is not anticipated that the patient will be medically stable for discharge from the hospital within 2 midnights of admission. The following factors support the patient status of inpatient.   " The patient's presenting symptoms include Presyncope. " The worrisome physical exam findings include Hypotension. " The initial radiographic and laboratory data are worrisome because of Evidence of UTI. " The chronic co-morbidities include HTN and PAF.   * I certify that at the point of admission it is my clinical judgment that the patient will require inpatient hospital care spanning beyond 2 midnights from the point of admission due to high intensity of service, high risk for further deterioration and high frequency of surveillance required.Barbette Merino MD [facility] Pager [phone]  If 7PM-7AM, please contact night-coverage [email or web address] Password [facility]  03/17/2018, 6:31 PM

## 2018-03-17 NOTE — ED Provider Notes (Signed)
Emergency Department Provider Note   I have reviewed the triage vital signs and the nursing notes.   HISTORY  Chief Complaint Hypotension   HPI Cynthia [name] is a 77 y.o. female with PMH of A-fib, HTN, and GERD resents to the emergency department with near syncope at home.  The patient was seen in the emergency department yesterday with similar symptoms.  The patient's husband states that patient will feel lightheaded mostly with standing.  He will take a blood pressure which today showed a systolic pressure of 76.  He notes a heart rate in the 70s as reported by the blood pressure monitor.  Patient is feeling lightheaded during these episodes.  She denies any chest pain or heart palpitations.  No shortness of breath.  She is being treated with initially Cipro for UTI with some associated dysuria.  When she was seen in the emergency department yesterday she was switched to Rocephin and Keflex which she has been taking.  Her UTI symptoms have resolved.  She is not experiencing any fevers or shaking chills. Two nights ago the patient did have multiple episodes of nonbloody diarrhea which have resolved.  She has not had diarrhea in the past 36 hours. No abdominal pain.    Past Medical History:  Diagnosis Date  . A-fib ([place])   . Anemia   . Arthritis    "joints" (11/10/2017)  . GERD (gastroesophageal reflux disease)   . Hearing loss   . History of blood transfusion 10/2017  . Hypertension   . Hyperthyroidism   . Melanoma ([place])    "cut off my back"  . OSA on CPAP   . Sinus headache     Patient Active Problem List   Diagnosis Date Noted  . Hypotension 03/17/2018  . Postural dizziness with presyncope 03/17/2018  . PAF (paroxysmal atrial fibrillation) ([place]) 03/17/2018  . Memory loss 11/25/2017  . GI bleed 11/06/2017  . Acute lower UTI 10/29/2017  . Acute GI bleeding 10/27/2017  . Acute blood loss anemia 10/27/2017  . Abnormal stress test   . Chest pain 11/23/2016  .  Hyperlipidemia 11/23/2016  . GERD (gastroesophageal reflux disease) 11/23/2016  . History of recurrent UTIs 11/23/2016  . Polyneuropathy 10/30/2016  . Bursitis, subacromial 10/30/2016  . Numbness 10/30/2016  . Arthritis, senescent 01/03/2016  . Left knee pain 01/03/2016  . Pain in joint, ankle and foot 02/28/2015  . Dry mouth 08/07/2014  . Dry eyes 08/07/2014  . Other headache syndrome 06/25/2014  . Sinusitis, chronic 06/25/2014  . Neck pain 06/25/2014  . OSA (obstructive sleep apnea) 06/25/2014  . Essential hypertension, benign 06/25/2014    Past Surgical History:  Procedure Laterality Date  . ABDOMINAL HYSTERECTOMY     "partial"  . APPENDECTOMY    . BIOPSY  11/17/2017   Procedure: BIOPSY;  Surgeon: Clarene Essex, MD;  Location: [facility];  Service: Endoscopy;;  . COLONOSCOPY WITH PROPOFOL N/A 11/17/2017   Procedure: COLONOSCOPY WITH PROPOFOL;  Surgeon: Clarene Essex, MD;  Location: [facility];  Service: Endoscopy;  Laterality: N/A;  . CRANIECTOMY FOR EXCISION OF ACOUSTIC NEUROMA    . ESOPHAGOGASTRODUODENOSCOPY (EGD) WITH PROPOFOL N/A 10/28/2017   Procedure: ESOPHAGOGASTRODUODENOSCOPY (EGD) WITH PROPOFOL;  Surgeon: Clarene Essex, MD;  Location: [facility];  Service: Endoscopy;  Laterality: N/A;  . KNEE ARTHROSCOPY Left   . LEFT HEART CATH AND CORONARY ANGIOGRAPHY N/A 11/24/2016   Procedure: LEFT HEART CATH AND CORONARY ANGIOGRAPHY;  Surgeon: Jettie Booze, MD;  Location: [facility];  Service: Cardiovascular;  Laterality: N/A;  . MELANOMA EXCISION     "off my back"  . NASAL SINUS SURGERY    . TONSILLECTOMY     Allergies Patient has no known allergies.  Family History  Problem Relation Age of Onset  . Congestive Heart Failure Mother   . Heart disease Father        History of heart attacks at a later age.      Social History Social History   Tobacco Use  . Smoking status: Former Smoker    Years: 2.00    Types: Cigarettes    Last attempt to quit: 1980     Years since quitting: 39.9  . Smokeless tobacco: Never Used  . Tobacco comment: 11/10/2017 "only smoked when I was on my period"  Substance Use Topics  . Alcohol use: Yes    Comment: 11/10/2017 "nothing lately; did have 1 glass of wine q night"  . Drug use: Never    Review of Systems  Constitutional: No fever/chills Eyes: No visual changes. ENT: No sore throat. Cardiovascular: Denies chest pain. Positive near syncope.  Respiratory: Denies shortness of breath. Gastrointestinal: No abdominal pain.  No nausea, no vomiting. Positive diarrhea (resolved).  No constipation. Genitourinary: Negative for dysuria. Musculoskeletal: Negative for back pain. Skin: Negative for rash. Neurological: Negative for headaches, focal weakness or numbness.  10-point ROS otherwise negative.  ____________________________________________   PHYSICAL EXAM:  VITAL SIGNS: ED Triage Vitals  Enc Vitals Group     BP 03/17/18 1615 107/71     Pulse Rate 03/17/18 1615 60     Resp 03/17/18 1615 16     Temp 03/17/18 1626 97.6 F (36.4 C)     Temp Source 03/17/18 1626 Oral     SpO2 03/17/18 1615 96 %   Constitutional: Alert and oriented. Well appearing and in no acute distress. Eyes: Conjunctivae are normal. Head: Atraumatic. Nose: No congestion/rhinnorhea. Mouth/Throat: Mucous membranes are dry.  Neck: No stridor.  Cardiovascular: Normal rate, regular rhythm. Good peripheral circulation. Grossly normal heart sounds.   Respiratory: Normal respiratory effort.  No retractions. Lungs CTAB. Gastrointestinal: Soft and nontender. No distention.  Musculoskeletal: No lower extremity tenderness nor edema. No gross deformities of extremities. Neurologic:  Normal speech and language. No gross focal neurologic deficits are appreciated.  Skin:  Skin is warm, dry and intact. No rash noted.  ____________________________________________   LABS (all labs ordered are listed, but only abnormal results are  displayed)  Labs Reviewed  URINE CULTURE - Abnormal; Notable for the following components:      Result Value   Culture   (*)    Value: <10,000 COLONIES/mL INSIGNIFICANT GROWTH Performed at [facility], [place] [address] [place], [place] [address]    All other components within normal limits  COMPREHENSIVE METABOLIC PANEL - Abnormal; Notable for the following components:   Chloride 113 (*)    Calcium 7.4 (*)    Total Protein 4.9 (*)    Albumin 2.9 (*)    Alkaline Phosphatase 35 (*)    All other components within normal limits  CBC WITH DIFFERENTIAL/PLATELET - Abnormal; Notable for the following components:   RBC 2.86 (*)    Hemoglobin 8.9 (*)    HCT 28.8 (*)    MCV 100.7 (*)    Platelets 145 (*)    All other components within normal limits  URINALYSIS, ROUTINE W REFLEX MICROSCOPIC - Abnormal; Notable for the following components:   Leukocytes, UA TRACE (*)  All other components within normal limits  COMPREHENSIVE METABOLIC PANEL - Abnormal; Notable for the following components:   Potassium 3.3 (*)    Calcium 8.7 (*)    Total Protein 6.0 (*)    All other components within normal limits  CBC - Abnormal; Notable for the following components:   RBC 3.62 (*)    Hemoglobin 11.2 (*)    HCT 35.2 (*)    All other components within normal limits  BRAIN NATRIURETIC PEPTIDE  I-STAT CG4 LACTIC ACID, ED  I-STAT TROPONIN, ED   ____________________________________________  EKG   EKG Interpretation  Date/Time:  Thursday March 17 2018 16:25:05 EST Ventricular Rate:  61 PR Interval:    QRS Duration: 112 QT Interval:  490 QTC Calculation: 494 R Axis:   -63 Text Interpretation:  Sinus rhythm LAD, consider left anterior fascicular block Low voltage, precordial leads RSR' in V1 or V2, right VCD or RVH Borderline prolonged QT interval No STEMI.  Confirmed by Nanda Quinton [id number]) on 03/17/2018 4:44:16 PM       ____________________________________________  RADIOLOGY  Dg  Chest 2 View  Result Date: 03/17/2018 CLINICAL DATA:  Recurrent UTIs, chest pain, shortness of breath, near syncopal episode EXAM: CHEST - 2 VIEW COMPARISON:  03/16/2018 FINDINGS: Normal heart size and vascularity. Minor basilar atelectasis. No focal pneumonia, collapse or consolidation. Negative for edema, significant effusion or pneumothorax. Trachea midline. Aorta atherosclerotic. Degenerative changes noted of the spine with a mild scoliosis. IMPRESSION: Basilar atelectasis. Thoracic atherosclerosis No definite acute chest process Electronically Signed   By: Jerilynn Mages.  Shick M.D.   On: 03/17/2018 17:19    ____________________________________________   PROCEDURES  Procedure(s) performed:   Procedures  None ____________________________________________   INITIAL IMPRESSION / ASSESSMENT AND PLAN / ED COURSE  Pertinent labs & imaging results that were available during my care of the patient were reviewed by me and considered in my medical decision making (see chart for details).  Patient returns to the emergency department with continued near syncope at home.  Labs from yesterday reviewed with no acute findings.  She has been taking her Keflex.  Patient blood pressure here in the low 102 systolic.  Apparently was orthostatic with EMS.  This is patient's second visit in 2 days for near syncope.  Plan for screening labs, chest x-ray, and likely admit for monitoring and repeat echo.  Most recent echo was July 2019 which showed ejection fraction of 65%.  Lab work reviewed with no acute findings.  Patient is comfortable.  Reviewed EKG and chest x-ray.  Patient with continued mild hypotension.  Discussed with family including husband at bedside.  Plan for admit for monitoring and medication evaluation as I suspect this is the cause of symptoms. ECHO from July 2019 reviewed.   Discussed patient's case with Hospitalist to request admission. Patient and family (if present) updated with plan. Care  transferred to Hospitalist service.  I reviewed all nursing notes, vitals, pertinent old records, EKGs, labs, imaging (as available).  ____________________________________________  FINAL CLINICAL IMPRESSION(S) / ED DIAGNOSES  Final diagnoses:  Near syncope  Hypotension, unspecified hypotension type     MEDICATIONS GIVEN DURING THIS VISIT:  Medications  thyroid (ARMOUR) tablet 60 mg (60 mg Oral Given 03/18/18 0542)  cycloSPORINE (RESTASIS) 0.05 % ophthalmic emulsion 1 drop (1 drop Both Eyes Given 03/18/18 1136)  cholecalciferol (VITAMIN D) tablet 1,000 Units (1,000 Units Oral Given 03/18/18 0930)  vitamin C (ASCORBIC ACID) tablet 1,000 mg (1,000 mg Oral Given 03/18/18 0930)  multivitamin with  minerals tablet 1 tablet (1 tablet Oral Given 03/18/18 0931)  pantoprazole (PROTONIX) EC tablet 40 mg (40 mg Oral Given 03/18/18 0834)  famotidine (PEPCID) tablet 20 mg (20 mg Oral Given 03/18/18 0931)  tiZANidine (ZANAFLEX) tablet 4 mg (has no administration in time range)  DULoxetine (CYMBALTA) DR capsule 30 mg (30 mg Oral Given 03/18/18 0931)  loratadine (CLARITIN) tablet 10 mg (10 mg Oral Given 03/18/18 0928)  Melatonin TABS 5 mg (5 mg Oral Given 03/17/18 2122)  atorvastatin (LIPITOR) tablet 10 mg (has no administration in time range)  apixaban (ELIQUIS) tablet 5 mg (5 mg Oral Given 03/18/18 0930)  buPROPion (WELLBUTRIN XL) 24 hr tablet 150 mg (150 mg Oral Given 03/18/18 0930)  sucralfate (CARAFATE) tablet 1 g (1 g Oral Given 03/18/18 1136)  acetaminophen (TYLENOL) tablet 1,000 mg (1,000 mg Oral Given 03/18/18 0931)  simethicone (MYLICON) chewable tablet 80 mg (has no administration in time range)  nitroGLYCERIN (NITROLINGUAL) 0.4 MG/SPRAY spray 1 spray (has no administration in time range)  0.9 %  sodium chloride infusion ( Intravenous New Bag/Given 03/18/18 0543)  ondansetron (ZOFRAN) tablet 4 mg (has no administration in time range)    Or  ondansetron (ZOFRAN) injection 4 mg (has no administration  in time range)  calcium carbonate (OS-CAL - dosed in mg of elemental calcium) tablet 500 mg of elemental calcium (500 mg of elemental calcium Oral Given 03/18/18 0833)  omega-3 acid ethyl esters (LOVAZA) capsule 1 g (1 g Oral Given 03/18/18 0930)  flecainide (TAMBOCOR) tablet 25 mg (has no administration in time range)  sodium chloride 0.9 % bolus 500 mL (0 mLs Intravenous Stopped 03/17/18 1834)    Note:  This document was prepared using Dragon voice recognition software and may include unintentional dictation errors.  Nanda Quinton, MD Emergency Medicine    , [facility], MD 03/18/18 1540

## 2018-03-17 NOTE — ED Notes (Signed)
Bed: WA07 Expected date:  Expected time:  Means of arrival:  Comments: EMS-orthostatic

## 2018-03-18 LAB — COMPREHENSIVE METABOLIC PANEL
ALT: 24 U/L (ref 0–44)
AST: 25 U/L (ref 15–41)
Albumin: 3.6 g/dL (ref 3.5–5.0)
Alkaline Phosphatase: 47 U/L (ref 38–126)
Anion gap: 7 (ref 5–15)
BUN: 10 mg/dL (ref 8–23)
CALCIUM: 8.7 mg/dL — AB (ref 8.9–10.3)
CO2: 30 mmol/L (ref 22–32)
Chloride: 106 mmol/L (ref 98–111)
Creatinine, Ser: 0.6 mg/dL (ref 0.44–1.00)
GFR calc Af Amer: >60 mL/min (ref >60)
GFR calc non Af Amer: >60 mL/min (ref >60)
Glucose, Bld: 99 mg/dL (ref 70–99)
Potassium: 3.3 mmol/L — ABNORMAL LOW (ref 3.5–5.1)
SODIUM: 143 mmol/L (ref 135–145)
Total Bilirubin: 0.9 mg/dL (ref 0.3–1.2)
Total Protein: 6 g/dL — ABNORMAL LOW (ref 6.5–8.1)

## 2018-03-18 LAB — URINE CULTURE
Culture: >=100000 — AB
Culture: <10000 — AB
Special Requests: NORMAL

## 2018-03-18 LAB — CBC
HCT: 35.2 % — ABNORMAL LOW (ref 36.0–46.0)
Hemoglobin: 11.2 g/dL — ABNORMAL LOW (ref 12.0–15.0)
MCH: 30.9 pg (ref 26.0–34.0)
MCHC: 31.8 g/dL (ref 30.0–36.0)
MCV: 97.2 fL (ref 80.0–100.0)
PLATELETS: 197 10*3/uL (ref 150–400)
RBC: 3.62 MIL/uL — ABNORMAL LOW (ref 3.87–5.11)
RDW: 13 % (ref 11.5–15.5)
WBC: 5 10*3/uL (ref 4.0–10.5)
nRBC: 0 % (ref 0.0–0.2)

## 2018-03-18 MED ORDER — FLECAINIDE ACETATE 50 MG PO TABS: 25.0000 mg | ORAL_TABLET | Freq: Two times a day (BID) | ORAL | 0 refills | Status: DC

## 2018-03-18 MED ORDER — FLECAINIDE ACETATE 50 MG PO TABS: 25.0000 mg | ORAL_TABLET | Freq: Two times a day (BID) | ORAL | Status: DC

## 2018-03-18 NOTE — Evaluation (Signed)
Physical Therapy Evaluation Patient Details Name: Cynthia [name] MRN: [id number] DOB: 1941/01/29 Today's Date: 03/18/2018   History of Present Illness  77 yo female arrived to ED on 12/4 for dizziness and hypotension. Pt reports that she has had 3 episodes of this in the past year. PMH includes afib, anemia, OA, GERD, self-reported cognitive changes presenting as memory impairment/confusion, HTN, OSA with CPAP, UTI, polyneuropathy, heart catheterization, recent GI bleed.  Clinical Impression   Pt presents with LE deconditioning, increased time and effort to perform mobility tasks, decreased knowledge of use of cane for ambulation, decreased activity tolerance, and decreased static and dynamic standing balance. Pt to benefit from acute PT to address deficits. Pt ambulated 150 ft with SPC with supervision to min guard assist. Pt plans to d/c home today with HHPT. While pt is in acute setting, PT to progress mobility as tolerated.    Follow Up Recommendations Home health PT;Supervision - Intermittent    Equipment Recommendations  None recommended by PT    Recommendations for Other Services       Precautions / Restrictions Precautions Precautions: Fall Restrictions Weight Bearing Restrictions: No      Mobility  Bed Mobility Overal bed mobility: Modified Independent             General bed mobility comments: Increased time and effort, HOB raised.  Transfers Overall transfer level: Needs assistance Equipment used: None Transfers: Sit to/from Stand Sit to Stand: Supervision         General transfer comment: Supervision for safety. Increased effort to come to standing.   Ambulation/Gait Ambulation/Gait assistance: Min guard;Supervision Gait Distance (Feet): 150 Feet Assistive device: Straight cane Gait Pattern/deviations: Step-through pattern;Decreased stride length Gait velocity: decr, especially with distractions   General Gait Details: Pt with straight cane  placement in R hand. Pt instructed in cane sequencing as pt was slightly dragging the cane and was inconsistent with sequencing (Cane should move with LLE). Slowed gait noted when people were in the hallway, almost as if pt was distracted by seeing people. Occasional unsteadiness, pt-corrected.  Stairs Stairs: (Pt defers stair training, states she feels confident in navigating single, short step to get into house.)          Wheelchair Mobility    Modified Rankin (Stroke Patients Only)       Balance Overall balance assessment: Needs assistance Sitting-balance support: No upper extremity supported Sitting balance-Leahy Scale: Normal     Standing balance support: Single extremity supported Standing balance-Leahy Scale: Fair   Single Leg Stance - Right Leg: 2 Single Leg Stance - Left Leg: 3 Tandem Stance - Right Leg: 3 Tandem Stance - Left Leg: 3     High level balance activites: Head turns High Level Balance Comments: mild gait unsteadiness with head turns.              Pertinent Vitals/Pain Pain Assessment: No/denies pain    Home Living Family/patient expects to be discharged to:: Private residence Living Arrangements: Spouse/significant other Available Help at Discharge: Family;Available 24 hours/day(pt's son and daughter assist as frequently) Type of Home: House(Pt reports it is a townhouse ) Home Access: Stairs to enter Entrance Stairs-Rails: None Entrance Stairs-Number of Steps: 1 Home Layout: One level Home Equipment: Cane - single point;Grab bars - toilet;Walker - 2 wheels      Prior Function Level of Independence: Independent with assistive device(s)         Comments: Pt uses cane for community ambulation.  Pt's husband also uses cane.  Pt reports she was driving previously this year, but she since decided not to drive due to dizziness episodes. Pt reports falls in association with dizziness, but otherwise pt has only had near falls.      Hand  Dominance   Dominant Hand: Right    Extremity/Trunk Assessment   Upper Extremity Assessment Upper Extremity Assessment: Overall WFL for tasks assessed    Lower Extremity Assessment Lower Extremity Assessment: Generalized weakness    Cervical / Trunk Assessment Cervical / Trunk Assessment: Normal  Communication   Communication: HOH  Cognition Arousal/Alertness: Awake/alert Behavior During Therapy: WFL for tasks assessed/performed Overall Cognitive Status: Within Functional Limits for tasks assessed                                 General Comments: Pt reports cognitive deficits including memory loss and intermittent confusion.       General Comments      Exercises General Exercises - Lower Extremity Hip Flexion/Marching: AROM;Both;5 reps;Seated Other Exercises Other Exercises: PT educated pt on seated marches, seated long arc quads. Standing exercises including hip flexion, hip abduction, and hip extension with use of UE support (i.e. countertop).    Assessment/Plan    PT Assessment Patient needs continued PT services  PT Problem List Decreased strength;Decreased activity tolerance;Decreased knowledge of use of DME       PT Treatment Interventions DME instruction;Therapeutic activities;Gait training;Therapeutic exercise;Patient/family education;Balance training;Stair training;Functional mobility training    PT Goals (Current goals can be found in the Care Plan section)  Acute Rehab PT Goals Patient Stated Goal: walk dog again  PT Goal Formulation: With patient Time For Goal Achievement: 04/01/18 Potential to Achieve Goals: Good    Frequency Min 3X/week   Barriers to discharge        Co-evaluation               AM-PAC PT "6 Clicks" Mobility  Outcome Measure Help needed turning from your back to your side while in a flat bed without using bedrails?: A Little Help needed moving from lying on your back to sitting on the side of a flat bed  without using bedrails?: A Little Help needed moving to and from a bed to a chair (including a wheelchair)?: None Help needed standing up from a chair using your arms (e.g., wheelchair or bedside chair)?: None Help needed to walk in hospital room?: A Little Help needed climbing 3-5 steps with a railing? : A Little 6 Click Score: 20    End of Session Equipment Utilized During Treatment: Gait belt Activity Tolerance: Patient tolerated treatment well Patient left: in bed;with bed alarm set;with call bell/phone within reach Nurse Communication: Other (comment)(Unable to locate RN after session) PT Visit Diagnosis: Other abnormalities of gait and mobility (R26.89)    Time: 1443-1540 PT Time Calculation (min) (ACUTE ONLY): 25 min   Charges:   PT Evaluation $PT Eval Low Complexity: 1 Low PT Treatments $Gait Training: 8-22 mins        Julien Girt, PT Acute Rehabilitation Services Pager [phone]  Office [phone]   Lavaeh Bau D Oliviagrace Crisanti 03/18/2018, 1:00 PM

## 2018-03-18 NOTE — Progress Notes (Signed)
[facility] asked to consult on patient. Dr. Virgina Jock with [facility] consulted on the patient 11/2017 and recommended OP f/u with their office. I spoke with the patient and she identifies Dr. Einar Gip as her regular cardiologist who she has since followed up with. I have let Dr. Tyrell Antonio know to consult patient's primary cardiologist instead. Laquon Emel PA-C

## 2018-03-18 NOTE — H&P (Signed)
CARDIOLOGY CONSULT NOTE  [name] MRN: [id number] DOB/AGE: 75/75/1942 75 y.o.  Admit date: 03/17/2018 Referring Physician  Carney Harder, MD Primary Physician:  Deland Pretty, MD Reason for Consultation  Near syncope  HPI: Cynthia [name]  is a 77 year old Caucasian female with paroxysmal atrial fibrillation, normal coronary arteries by cardiac catheterization 2018, obstructive sleep apnea on CPAP, hypertension, hyperlipidemia, chronic fatigue and memory loss admitted to the hospital with near syncope.  She is presently on anticoagulation for atrial fibrillation, has history of severe anemia with negative GI work-up in July 2019.  Patient was in her usual state of health, was sitting on the sofa, got up and walked towards the kitchen, suddenly felt dizzy and held onto the stove and stood there and asked for help from her husband, who came and helped her to lay down on the ground and called EMS and brought to the emergency room.  Initial evaluation had revealed bradycardia and low blood pressure. As per patient's husband who noticed that her systolic blood pressure was 76 mmHg, heart rate was low around 40 to 60 bpm.  Patient was also seen in the emergency room 2 days ago with dizziness, had also stated that she has had diarrhea and she was also treated for UTI and discharged home.     Past Medical History:  Diagnosis Date  . A-fib ([place])   . Anemia   . Arthritis    "joints" (11/10/2017)  . GERD (gastroesophageal reflux disease)   . Hearing loss   . History of blood transfusion 10/2017  . Hypertension   . Hyperthyroidism   . Melanoma ([place])    "cut off my back"  . OSA on CPAP   . Sinus headache      Past Surgical History:  Procedure Laterality Date  . ABDOMINAL HYSTERECTOMY     "partial"  . APPENDECTOMY    . BIOPSY  11/17/2017   Procedure: BIOPSY;  Surgeon: Clarene Essex, MD;  Location: [facility];  Service: Endoscopy;;  . COLONOSCOPY WITH PROPOFOL N/A 11/17/2017    Procedure: COLONOSCOPY WITH PROPOFOL;  Surgeon: Clarene Essex, MD;  Location: [facility];  Service: Endoscopy;  Laterality: N/A;  . CRANIECTOMY FOR EXCISION OF ACOUSTIC NEUROMA    . ESOPHAGOGASTRODUODENOSCOPY (EGD) WITH PROPOFOL N/A 10/28/2017   Procedure: ESOPHAGOGASTRODUODENOSCOPY (EGD) WITH PROPOFOL;  Surgeon: Clarene Essex, MD;  Location: [facility];  Service: Endoscopy;  Laterality: N/A;  . KNEE ARTHROSCOPY Left   . LEFT HEART CATH AND CORONARY ANGIOGRAPHY N/A 11/24/2016   Procedure: LEFT HEART CATH AND CORONARY ANGIOGRAPHY;  Surgeon: Jettie Booze, MD;  Location: [facility];  Service: Cardiovascular;  Laterality: N/A;  . MELANOMA EXCISION     "off my back"  . NASAL SINUS SURGERY    . TONSILLECTOMY       Family History  Problem Relation Age of Onset  . Congestive Heart Failure Mother   . Heart disease Father        History of heart attacks at a later age.       Social History: Social History   Socioeconomic History  . Marital status: Married    Spouse name: Not on file  . Number of children: Not on file  . Years of education: Not on file  . Highest education level: Not on file  Occupational History  . Not on file  Social Needs  . Financial resource strain: Not on file  . Food insecurity:    Worry: Not on file  Inability: Not on file  . Transportation needs:    Medical: Not on file    Non-medical: Not on file  Tobacco Use  . Smoking status: Former Smoker    Years: 2.00    Types: Cigarettes    Last attempt to quit: 1980    Years since quitting: 39.9  . Smokeless tobacco: Never Used  . Tobacco comment: 11/10/2017 "only smoked when I was on my period"  Substance and Sexual Activity  . Alcohol use: Yes    Comment: 11/10/2017 "nothing lately; did have 1 glass of wine q night"  . Drug use: Never  . Sexual activity: Not Currently  Lifestyle  . Physical activity:    Days per week: Not on file    Minutes per session: Not on file  . Stress: Not on file   Relationships  . Social connections:    Talks on phone: Not on file    Gets together: Not on file    Attends religious service: Not on file    Active member of club or organization: Not on file    Attends meetings of clubs or organizations: Not on file    Relationship status: Not on file  . Intimate partner violence:    Fear of current or ex partner: Not on file    Emotionally abused: Not on file    Physically abused: Not on file    Forced sexual activity: Not on file  Other Topics Concern  . Not on file  Social History Narrative  . Not on file     Medications Prior to Admission  Medication Sig Dispense Refill Last Dose  . acetaminophen (TYLENOL) 500 MG tablet Take 1,000 mg by mouth 2 (two) times daily.   03/16/2018 at Unknown time  . Alpha-Lipoic Acid (LIPOIC ACID PO) Take 600 mg by mouth 2 (two) times daily.    03/17/2018 at Unknown time  . amLODipine (NORVASC) 5 MG tablet Take 5 mg by mouth daily.  0 03/17/2018 at Unknown time  . atorvastatin (LIPITOR) 10 MG tablet TAKE 1 TABLET BY MOUTH EVERY DAY AT 6PM *NEED OFFICE VISIT* (Patient taking differently: Take 10 mg by mouth daily at 6 PM. ) 15 tablet 0 03/16/2018 at Unknown time  . buPROPion (WELLBUTRIN XL) 150 MG 24 hr tablet Take 150 mg by mouth daily.   4 03/17/2018 at Unknown time  . CALCIUM PO Take 600 mg by mouth 2 (two) times daily.    03/17/2018 at Unknown time  . cephALEXin (KEFLEX) 500 MG capsule Take 1 capsule (500 mg total) by mouth 4 (four) times daily. 28 capsule 0 03/17/2018 at Unknown time  . cetirizine (ZYRTEC) 10 MG tablet Take 10 mg by mouth daily.   03/16/2018 at Unknown time  . cholecalciferol (VITAMIN D) 1000 UNITS tablet Take 1,000 Units by mouth daily.   03/17/2018 at Unknown time  . CRANBERRY CONCENTRATE PO Take 650 mg by mouth daily.    03/17/2018 at Unknown time  . cycloSPORINE (RESTASIS) 0.05 % ophthalmic emulsion Place 1 drop into both eyes 2 (two) times daily.   03/17/2018 at Unknown time  . diltiazem (CARDIZEM  CD) 120 MG 24 hr capsule TAKE 1 CAPSULE BY MOUTH EVERY DAY *NEED OFFICE VISIT* (Patient taking differently: Take 120 mg by mouth daily. ) 15 capsule 0 03/16/2018 at Unknown time  . DULoxetine (CYMBALTA) 30 MG capsule TAKE 1 CAPSULE (30 MG TOTAL) BY MOUTH 2 (TWO) TIMES DAILY. 180 capsule 2 03/17/2018 at Unknown time  .  ELIQUIS 5 MG TABS tablet Take 5 mg by mouth 2 (two) times daily.  1 03/17/2018 at [place]  . flecainide (TAMBOCOR) 50 MG tablet Take 50 mg by mouth 2 (two) times daily.  6 03/17/2018 at Unknown time  . GLUCOSAMINE-CHONDROITIN PO Take 2 tablets by mouth daily with lunch. Glucosamine 1500mg  Chondroitin 1200mg    03/17/2018 at Unknown time  . Lutein-Zeaxanthin 25-5 MG CAPS Take 1 tablet by mouth daily.   03/17/2018 at Unknown time  . Melatonin 5 MG TABS Take 5 mg by mouth at bedtime.   03/16/2018 at Unknown time  . Multiple Vitamin (MULTIVITAMIN WITH MINERALS) TABS tablet Take 1 tablet by mouth daily.   03/17/2018 at Unknown time  . nitroGLYCERIN (NITROLINGUAL) 0.4 MG/SPRAY spray Place 1 spray under the tongue every 5 (five) minutes x 3 doses as needed for chest pain.   unknown  . Omega-3 1000 MG CAPS Take 1,000 mg by mouth daily.   03/17/2018 at Unknown time  . pantoprazole (PROTONIX) 40 MG tablet Take 40 mg by mouth daily before breakfast.   1 03/17/2018 at Unknown time  . simethicone (MYLICON) 80 MG chewable tablet Chew 80 mg by mouth every 6 (six) hours as needed for flatulence.   unknown  . sucralfate (CARAFATE) 1 g tablet Take 1 g by mouth 4 (four) times daily -  with meals and at bedtime.   5 03/17/2018 at Unknown time  . thyroid (ARMOUR) 60 MG tablet Take 60 mg by mouth daily before breakfast.   03/17/2018 at Unknown time  . trimethoprim (TRIMPEX) 100 MG tablet Take 100 mg by mouth daily.  0 03/17/2018 at Unknown time  . vitamin C (ASCORBIC ACID) 500 MG tablet Take 1,000 mg by mouth daily.    03/17/2018 at Unknown time  . losartan (COZAAR) 50 MG tablet Take 1 tablet (50 mg total) by mouth daily. 30  tablet 0 Taking  . ranitidine (ZANTAC) 150 MG tablet Take 1 tablet (150 mg total) by mouth 2 (two) times daily. 20 tablet 0 11/06/2017 at Unknown time  . tiZANidine (ZANAFLEX) 4 MG tablet TAKE 1 TABLET BY MOUTH THREE TIMES A DAY AS NEEDED (Patient taking differently: TAKE 1 TABLET BY MOUTH DAILY AS NEEDED SPASMS) 90 tablet 5 unknown   Review of Systems  Constitutional: Negative.   Eyes: Negative.   Respiratory: Negative.   Cardiovascular: Negative for chest pain, palpitations, leg swelling and PND.  Gastrointestinal: Positive for diarrhea (1 day ago, resolved). Negative for blood in stool and constipation.  Genitourinary: Negative.   Musculoskeletal: Negative.   Neurological: Positive for dizziness and weakness. Negative for seizures and loss of consciousness.       Memory loss  Psychiatric/Behavioral: Negative.   All other systems reviewed and are negative.   Physical Exam: Blood pressure (!) 150/83, pulse 71, temperature 98.5 F (36.9 C), temperature source Oral, resp. rate 18, height 5\' 5"  (1.651 m), weight 70 kg, SpO2 96 %.  Physical Exam  Constitutional: She is oriented to person, place, and time. She appears well-developed and well-nourished. No distress.  HENT:  Head: Atraumatic.  Neck: Neck supple. No JVD present. No tracheal deviation present.  Cardiovascular: Normal rate, regular rhythm, normal heart sounds and intact distal pulses.  Pulmonary/Chest: Effort normal and breath sounds normal.  Abdominal: Soft. Bowel sounds are normal.  Musculoskeletal: Normal range of motion. She exhibits no edema.  Neurological: She is alert and oriented to person, place, and time.  Skin: Skin is warm and dry.  Psychiatric: She  has a normal mood and affect.   Labs:   Lab Results  Component Value Date   WBC 5.0 03/18/2018   HGB 11.2 (L) 03/18/2018   HCT 35.2 (L) 03/18/2018   MCV 97.2 03/18/2018   PLT 197 03/18/2018    Recent Labs  Lab 03/18/18 0715  NA 143  K 3.3*  CL 106  CO2  30  BUN 10  CREATININE 0.60  CALCIUM 8.7*  PROT 6.0*  BILITOT 0.9  ALKPHOS 47  ALT 24  AST 25  GLUCOSE 99    Lipid Panel     Component Value Date/Time   CHOL 165 11/24/2016 0040   TRIG 88 11/24/2016 0040   HDL 61 11/24/2016 0040   CHOLHDL 2.7 11/24/2016 0040   VLDL 18 11/24/2016 0040   LDLCALC 86 11/24/2016 0040    BNP (last 3 results) Recent Labs    03/17/18 1613  BNP 83.5    HEMOGLOBIN A1C Lab Results  Component Value Date   HGBA1C 5.6 11/24/2016   MPG 114 11/24/2016    Cardiac Panel (last 3 results) No results for input(s): CKTOTAL, CKMB, TROPONINI, RELINDX in the last 8760 hours.  Lab Results  Component Value Date   TROPONINI <0.03 11/24/2016     TSH Recent Labs    11/06/17 1117  TSH 1.341    Radiology: Dg Chest 2 View  Result Date: 03/17/2018 CLINICAL DATA:  Recurrent UTIs, chest pain, shortness of breath, near syncopal episode EXAM: CHEST - 2 VIEW COMPARISON:  03/16/2018 FINDINGS: Normal heart size and vascularity. Minor basilar atelectasis. No focal pneumonia, collapse or consolidation. Negative for edema, significant effusion or pneumothorax. Trachea midline. Aorta atherosclerotic. Degenerative changes noted of the spine with a mild scoliosis. IMPRESSION: Basilar atelectasis. Thoracic atherosclerosis No definite acute chest process Electronically Signed   By: Jerilynn Mages.  Shick M.D.   On: 03/17/2018 17:19   Dg Chest 2 View  Result Date: 03/16/2018 CLINICAL DATA:  77 year old female with a history of syncope EXAM: CHEST - 2 VIEW COMPARISON:  11/09/2017 FINDINGS: Cardiomediastinal silhouette unchanged in size and contour. No evidence of central vascular congestion. Low lung volumes with coarsened interstitial markings. Stigmata of emphysema, with increased retrosternal airspace, flattened hemidiaphragms, increased AP diameter, and hyperinflation on the AP view. No pleural effusion or pneumothorax. No confluent airspace disease. Vascular calcifications. No  displaced fracture IMPRESSION: Chronic lung changes and evidence of emphysema without evidence of acute cardiopulmonary disease. Electronically Signed   By: Corrie Mckusick D.O.   On: 03/16/2018 15:06    Scheduled Meds: . acetaminophen  1,000 mg Oral BID  . apixaban  5 mg Oral BID  . atorvastatin  10 mg Oral q1800  . buPROPion  150 mg Oral Daily  . calcium carbonate  1 tablet Oral BID WC  . cholecalciferol  1,000 Units Oral Daily  . cycloSPORINE  1 drop Both Eyes BID  . DULoxetine  30 mg Oral BID  . famotidine  20 mg Oral BID  . flecainide  50 mg Oral BID  . loratadine  10 mg Oral Daily  . Melatonin  5 mg Oral QHS  . multivitamin with minerals  1 tablet Oral Daily  . omega-3 acid ethyl esters  1 g Oral Daily  . pantoprazole  40 mg Oral QAC breakfast  . sucralfate  1 g Oral TID WC & HS  . thyroid  60 mg Oral Q0600  . trimethoprim  100 mg Oral Daily  . vitamin C  1,000 mg Oral Daily  Continuous Infusions: . sodium chloride 100 mL/hr at 03/18/18 0543   PRN Meds:.nitroGLYCERIN, ondansetron **OR** ondansetron (ZOFRAN) IV, simethicone, tiZANidine  CARDIAC STUDIES: EKG 03/18/2018: Normal sinus rhythm with rate of 69 bpm, leftward axis, incomplete right bundle branch block.  No evidence of ischemia.  Low-voltage complexes.  ECHO 11/09/2017: Left ventricle: The cavity size was normal. Systolic function was   normal. The estimated ejection fraction was in the range of 60%  to 65%. Wall motion was normal; there were no regional wall  motion abnormalities. The study is not technically sufficient to  allow evaluation of LV diastolic function. - Pulmonary arteries: PA peak pressure: 32 mm Hg (S).   ASSESSMENT AND PLAN:  1. Vasovagal near syncope. Orthostatic negative.  2. Hypertension 3. PAF. CHA2DS2-VASCScore: Risk Score  4,  Yearly risk of stroke  4.  OSA on CPAP  Rec:  Decrease Flecainide to 25 mg BID. On anticoagulation for A. Fib. Flecainide may be contributing for her symptoms  although symptoms were occurring way before starting antiarrhythic. Patient's husband very anxious and I will discuss with him regarding counter pressure maneuver and also to lay her down with onset of symptoms. Can be discharged home. No heart block on Tele.  Although she was stated to have bradycardia as per her husband, in the emergency room her heart rate was well-preserved and her blood pressure was also well preserved.  I would like to see her back in our office within the next 1 week to 10 days for follow-up to prevent recurrent admission to the hospital. D/W Primary team. Continue Diltiazem  Adrian Prows, MD 03/18/2018, 10:33 AM [facility] [place] Pager: [phone] Office: [phone] If no answer Cell [phone]

## 2018-03-18 NOTE — Care Management Note (Signed)
Case Management Note  Patient Details  Name: Cynthia [name] MRN: [id number] Date of Birth: 1940/08/02  Subjective/Objective:Spoke to patient/spouse/dtr on speaker phone-spouse concerns about the symptoms patient had during this hospitalization-MD addressed all concerns, patient chose [facility] rep Santiago Glad aware of d/c & HHC orders.                    Action/Plan:dc home w/HHC.   Expected Discharge Date:  03/18/18               Expected Discharge Plan:  [place]  In-House Referral:     Discharge planning Services  CM Consult  Post Acute Care Choice:  Durable Medical Equipment(rw) Choice offered to:  Patient  DME Arranged:    DME Agency:     HH Arranged:  RN, PT, OT HH Agency:  [facility]  Status of Service:  Completed, signed off  If discussed at [place] of Stay Meetings, dates discussed:    Additional Comments:  Dessa Phi, RN 03/18/2018, 11:40 AM

## 2018-03-18 NOTE — Discharge Summary (Addendum)
Physician Discharge Summary  Cynthia [name] LXB:[id number] DOB: [birth year] DOA: 03/17/2018  PCP: Deland Pretty, MD  Admit date: 03/17/2018 Discharge date: 03/18/2018  Admitted From: Home  Disposition:  Home   Recommendations for Outpatient Follow-up:  1. Follow up with PCP in 1-2 weeks 2. Please obtain BMP/CBC in one week 3. Close follow up with cardiology for further titration of medications.   Home Health: yes.   Discharge Condition: Stable.  CODE STATUS: Full code.  Diet recommendation: Heart Healthy   Brief/Interim Summary: Cynthia [name] is a 77 y.o. female with medical history significant of paroxysmal atrial fibrillation, recent GI bleed, recurrent postural hypotension with presyncope multiple times this year including yesterday, previous UTI, hypertension, hyperlipidemia who came to the ER after experiencing another episode of hypotension.  Patient was very dizzy and almost passed out.  She is on multiple medications for blood pressure and A. fib including diltiazem amlodipine as well as losartan.  When she had this episode in July the losartan dose was decreased from 100mg  to 50 mg.  At home today blood pressure was found to be in the 41U systolic.  She had recent UTI for which she was on Cipro but then given Keflex only yesterday.  Symptoms appear to be resolving.  She denied any nausea vomiting or diarrhea.  She is not orthostatic in the ER.  Patient has received bolus of IV fluids and blood pressure is currently corrected.  Due to recurrence of her syncope and presyncope she is being admitted for work-up of and monitoring blood pressure.  ED Course: Temperature 97.5, blood pressure is currently 103/63 pulse 59 respiratory 24 oxygen sat 95% room air.  White count is 4.4 hemoglobin 8.9 and platelet 145.  Chloride is 113 calcium 7.4 but creatinine 0.70.  Urinalysis today is normal.  EKG showed no significant finding and chest x-ray showed no acute finding.  Presyncope: Patient  presented to ED 2 days ago after vaso-vagal ; syncope. Dizziness, pass out  after having diarrhea. She was evaluated in the ED and discharge home.  She presented yesterday with near syncope, dizziness on ambulation. BP was low on admission.  Discussed with Dr. Einar Gip, he knows patient very well. She has supine Hypertension and orthostatic Hypotension. Ok to discharge home today. Resume cozaar, stop Norvasc. Resume Cardizem. Reduce flecainide.  Eduction provided to patient regarding activities and precaution how to stand. She knows to lying down when she feel dizzy.  Care discussed with husband over phone. Will arrange [facility] PT and Nurse.  Patient improved faster than expected.   #2 Recent UTI: resume antibiotics she was taking. .  #3 Obstructive sleep apnea: CPAP at night.  #4 GERD: Continue with PPIs  #5 Paroxysmal atrial fibrillation: Continue with Eliquis.  Discussed with DR Einar Gip, decrease flecainide to 25 mg BID>  Discussed with Dr Einar Gip, ok to resume Cardizem at discharge   #6 polyneuropathy: Continue with home regimen.  #7 recent GI bleed: hb has remain stable.    Discharge Diagnoses:  Principal Problem:   Postural dizziness with presyncope Active Problems:   OSA (obstructive sleep apnea)   Essential hypertension, benign   Polyneuropathy   Hyperlipidemia   GERD (gastroesophageal reflux disease)   Hypotension   PAF (paroxysmal atrial fibrillation) [facility])    Discharge Instructions  Discharge Instructions    Diet - low sodium heart healthy   Complete by:  As directed    Diet - low sodium heart healthy   Complete by:  As directed  Diet - low sodium heart healthy   Complete by:  As directed    Increase activity slowly   Complete by:  As directed    Increase activity slowly   Complete by:  As directed    Increase activity slowly   Complete by:  As directed      Allergies as of 03/18/2018   No Known Allergies     Medication List    STOP taking these  medications   amLODipine 5 MG tablet Commonly known as:  NORVASC   trimethoprim 100 MG tablet Commonly known as:  TRIMPEX     TAKE these medications   acetaminophen 500 MG tablet Commonly known as:  TYLENOL Take 1,000 mg by mouth 2 (two) times daily.   atorvastatin 10 MG tablet Commonly known as:  LIPITOR TAKE 1 TABLET BY MOUTH EVERY DAY AT 6PM *NEED OFFICE VISIT* What changed:  See the new instructions.   buPROPion 150 MG 24 hr tablet Commonly known as:  WELLBUTRIN XL Take 150 mg by mouth daily.   CALCIUM PO Take 600 mg by mouth 2 (two) times daily.   cephALEXin 500 MG capsule Commonly known as:  KEFLEX Take 1 capsule (500 mg total) by mouth 4 (four) times daily.   cetirizine 10 MG tablet Commonly known as:  ZYRTEC Take 10 mg by mouth daily.   cholecalciferol 1000 units tablet Commonly known as:  VITAMIN D Take 1,000 Units by mouth daily.   CRANBERRY CONCENTRATE PO Take 650 mg by mouth daily.   cycloSPORINE 0.05 % ophthalmic emulsion Commonly known as:  RESTASIS Place 1 drop into both eyes 2 (two) times daily.   diltiazem 120 MG 24 hr capsule Commonly known as:  CARDIZEM CD TAKE 1 CAPSULE BY MOUTH EVERY DAY *NEED OFFICE VISIT* What changed:  See the new instructions.   DULoxetine 30 MG capsule Commonly known as:  CYMBALTA TAKE 1 CAPSULE (30 MG TOTAL) BY MOUTH 2 (TWO) TIMES DAILY.   ELIQUIS 5 MG Tabs tablet Generic drug:  apixaban Take 5 mg by mouth 2 (two) times daily.   flecainide 50 MG tablet Commonly known as:  TAMBOCOR Take 0.5 tablets (25 mg total) by mouth 2 (two) times daily. What changed:  how much to take   GLUCOSAMINE-CHONDROITIN PO Take 2 tablets by mouth daily with lunch. Glucosamine 1500mg  Chondroitin 1200mg    LIPOIC ACID PO Take 600 mg by mouth 2 (two) times daily.   losartan 50 MG tablet Commonly known as:  COZAAR Take 1 tablet (50 mg total) by mouth daily.   Lutein-Zeaxanthin 25-5 MG Caps Take 1 tablet by mouth daily.    Melatonin 5 MG Tabs Take 5 mg by mouth at bedtime.   multivitamin with minerals Tabs tablet Take 1 tablet by mouth daily.   nitroGLYCERIN 0.4 MG/SPRAY spray Commonly known as:  NITROLINGUAL Place 1 spray under the tongue every 5 (five) minutes x 3 doses as needed for chest pain.   Omega-3 1000 MG Caps Take 1,000 mg by mouth daily.   pantoprazole 40 MG tablet Commonly known as:  PROTONIX Take 40 mg by mouth daily before breakfast.   ranitidine 150 MG tablet Commonly known as:  ZANTAC Take 1 tablet (150 mg total) by mouth 2 (two) times daily.   simethicone 80 MG chewable tablet Commonly known as:  MYLICON Chew 80 mg by mouth every 6 (six) hours as needed for flatulence.   sucralfate 1 g tablet Commonly known as:  CARAFATE Take 1 g by mouth  4 (four) times daily -  with meals and at bedtime.   thyroid 60 MG tablet Commonly known as:  ARMOUR Take 60 mg by mouth daily before breakfast.   tiZANidine 4 MG tablet Commonly known as:  ZANAFLEX TAKE 1 TABLET BY MOUTH THREE TIMES A DAY AS NEEDED What changed:    how much to take  how to take this  when to take this   vitamin C 500 MG tablet Commonly known as:  ASCORBIC ACID Take 1,000 mg by mouth daily.      Follow-up Information    [facility].   Specialty:  [place] Why:  [facility] nursing/physical therapy/occupational therapy Contact information: [place] [address] [phone]        Deland Pretty, MD Follow up.   Specialty:  Internal Medicine Why:  Call to schedule hospital follow-up appointment in 1-2 weeks. Need BMP/CBC in one week Contact information: [place] [address] [phone]        Adrian Prows, MD Follow up.   Specialty:  Cardiology Why:  Call to schedule hospital follow-up appointment Contact information: [place] [address] [phone]          No Known  Allergies  Consultations:  Dr Einar Gip   Procedures/Studies: Dg Chest 2 View  Result Date: 03/17/2018 CLINICAL DATA:  Recurrent UTIs, chest pain, shortness of breath, near syncopal episode EXAM: CHEST - 2 VIEW COMPARISON:  03/16/2018 FINDINGS: Normal heart size and vascularity. Minor basilar atelectasis. No focal pneumonia, collapse or consolidation. Negative for edema, significant effusion or pneumothorax. Trachea midline. Aorta atherosclerotic. Degenerative changes noted of the spine with a mild scoliosis. IMPRESSION: Basilar atelectasis. Thoracic atherosclerosis No definite acute chest process Electronically Signed   By: Jerilynn Mages.  Shick M.D.   On: 03/17/2018 17:19   Dg Chest 2 View  Result Date: 03/16/2018 CLINICAL DATA:  77 year old female with a history of syncope EXAM: CHEST - 2 VIEW COMPARISON:  11/09/2017 FINDINGS: Cardiomediastinal silhouette unchanged in size and contour. No evidence of central vascular congestion. Low lung volumes with coarsened interstitial markings. Stigmata of emphysema, with increased retrosternal airspace, flattened hemidiaphragms, increased AP diameter, and hyperinflation on the AP view. No pleural effusion or pneumothorax. No confluent airspace disease. Vascular calcifications. No displaced fracture IMPRESSION: Chronic lung changes and evidence of emphysema without evidence of acute cardiopulmonary disease. Electronically Signed   By: Corrie Mckusick D.O.   On: 03/16/2018 15:06     Subjective: Patient feeling well, denies chest pain, dyspnea.   Discharge Exam: Vitals:   03/18/18 1014 03/18/18 1424  BP: (!) 150/83 140/80  Pulse: 71 76  Resp:    Temp:    SpO2: 96% 95%   Vitals:   03/17/18 1958 03/18/18 0410 03/18/18 1014 03/18/18 1424  BP: (!) 161/85 (!) 152/76 (!) 150/83 140/80  Pulse: 83 68 71 76  Resp: 20 18    Temp: 98.8 F (37.1 C) 98.5 F (36.9 C)    TempSrc: Oral Oral    SpO2: 98% 96% 96% 95%  Weight: 70 kg     Height: 5\' 5"  (1.651 m)        General: Pt is alert, awake, not in acute distress Cardiovascular: RRR, S1/S2 +, no rubs, no gallops Respiratory: CTA bilaterally, no wheezing, no rhonchi Abdominal: Soft, NT, ND, bowel sounds + Extremities: no edema, no cyanosis    The results of significant diagnostics from this hospitalization (including  imaging, microbiology, ancillary and laboratory) are listed below for reference.     Microbiology: Recent Results (from the past 240 hour(s))  Urine culture     Status: Abnormal   Collection Time: 03/16/18  1:31 PM  Result Value Ref Range Status   Specimen Description URINE, RANDOM  Final   Special Requests   Final    NONE Performed at [facility], [address], [place], [place] [address]    Culture >=100,000 COLONIES/mL ENTEROCOCCUS FAECALIS (A)  Final   Report Status 03/18/2018 FINAL  Final   Organism ID, Bacteria ENTEROCOCCUS FAECALIS (A)  Final      Susceptibility   Enterococcus faecalis - MIC*    AMPICILLIN <=2 SENSITIVE Sensitive     LEVOFLOXACIN 1 SENSITIVE Sensitive     NITROFURANTOIN <=16 SENSITIVE Sensitive     VANCOMYCIN 1 SENSITIVE Sensitive     * >=100,000 COLONIES/mL ENTEROCOCCUS FAECALIS  Urine culture     Status: Abnormal   Collection Time: 03/17/18  5:36 PM  Result Value Ref Range Status   Specimen Description   Final    URINE, CLEAN CATCH Performed at [facility], [place] [address] [place], [place] [address]    Special Requests   Final    Normal Performed at [facility], [place] [address], [place], [place] [address]    Culture (A)  Final    <10,000 COLONIES/mL INSIGNIFICANT GROWTH Performed at [place] [address], [place], [place] [address]    Report Status 03/18/2018 FINAL  Final     Labs: BNP (last 3 results) Recent Labs    03/17/18 1613  BNP 27.0   Basic Metabolic Panel: Recent Labs  Lab 03/16/18 1327 03/17/18 1613 03/18/18 0715  NA 140 142 143  K 3.6 3.9 3.3*  CL  106 113* 106  CO2 23 23 30   GLUCOSE 104* 80 99  BUN 20 15 10   CREATININE 0.70 0.70 0.60  CALCIUM 8.4* 7.4* 8.7*   Liver Function Tests: Recent Labs  Lab 03/16/18 1327 03/17/18 1613 03/18/18 0715  AST 24 31 25   ALT 23 16 24   ALKPHOS 41 35* 47  BILITOT 0.4 0.7 0.9  PROT 5.4* 4.9* 6.0*  ALBUMIN 3.1* 2.9* 3.6   No results for input(s): LIPASE, AMYLASE in the last 168 hours. No results for input(s): AMMONIA in the last 168 hours. CBC: Recent Labs  Lab 03/16/18 1327 03/17/18 1613 03/18/18 0715  WBC 5.4 4.4 5.0  NEUTROABS 3.4 2.3  --   HGB 9.8* 8.9* 11.2*  HCT 33.1* 28.8* 35.2*  MCV 99.4 100.7* 97.2  PLT 165 145* 197   Cardiac Enzymes: No results for input(s): CKTOTAL, CKMB, CKMBINDEX, TROPONINI in the last 168 hours. BNP: Invalid input(s): POCBNP CBG: No results for input(s): GLUCAP in the last 168 hours. D-Dimer No results for input(s): DDIMER in the last 72 hours. Hgb A1c No results for input(s): HGBA1C in the last 72 hours. Lipid Profile No results for input(s): CHOL, HDL, LDLCALC, TRIG, CHOLHDL, LDLDIRECT in the last 72 hours. Thyroid function studies No results for input(s): TSH, T4TOTAL, T3FREE, THYROIDAB in the last 72 hours.  Invalid input(s): FREET3 Anemia work up No results for input(s): VITAMINB12, FOLATE, FERRITIN, TIBC, IRON, RETICCTPCT in the last 72 hours. Urinalysis    Component Value Date/Time   COLORURINE YELLOW 03/17/2018 1736   APPEARANCEUR CLEAR 03/17/2018 1736   LABSPEC 1.010 03/17/2018 1736   PHURINE 6.0 03/17/2018 1736   GLUCOSEU NEGATIVE 03/17/2018  [place] 03/17/2018 [place] 03/17/2018 1736   KETONESUR NEGATIVE 03/17/2018 1736   PROTEINUR NEGATIVE 03/17/2018 1736   NITRITE NEGATIVE 03/17/2018 1736   LEUKOCYTESUR TRACE (A) 03/17/2018 1736   Sepsis Labs Invalid input(s): PROCALCITONIN,  WBC,  LACTICIDVEN Microbiology Recent Results (from the past 240 hour(s))  Urine culture     Status: Abnormal    Collection Time: 03/16/18  1:31 PM  Result Value Ref Range Status   Specimen Description URINE, RANDOM  Final   Special Requests   Final    NONE Performed at [facility], [place] [address] [place], [place] [address]    Culture >=100,000 COLONIES/mL ENTEROCOCCUS FAECALIS (A)  Final   Report Status 03/18/2018 FINAL  Final   Organism ID, Bacteria ENTEROCOCCUS FAECALIS (A)  Final      Susceptibility   Enterococcus faecalis - MIC*    AMPICILLIN <=2 SENSITIVE Sensitive     LEVOFLOXACIN 1 SENSITIVE Sensitive     NITROFURANTOIN <=16 SENSITIVE Sensitive     VANCOMYCIN 1 SENSITIVE Sensitive     * >=100,000 COLONIES/mL ENTEROCOCCUS FAECALIS  Urine culture     Status: Abnormal   Collection Time: 03/17/18  5:36 PM  Result Value Ref Range Status   Specimen Description   Final    URINE, CLEAN CATCH Performed at [facility], [place] [address], [place], [place] [address]    Special Requests   Final    Normal Performed at [facility], [place] [address], [place], [place] [address]    Culture (A)  Final    <10,000 COLONIES/mL INSIGNIFICANT GROWTH Performed at [place] [address], [place], [place] [address]    Report Status 03/18/2018 FINAL  Final     Time coordinating discharge: 35 minutes.   SIGNED:   Elmarie Shiley, MD  [facility] 03/18/2018, 7:58 PM Pager   If 7PM-7AM, please contact night-coverage [email or web address] Password [email or web address]

## 2018-03-18 NOTE — Progress Notes (Signed)
AVS reviewed with patient.  Verbalized understanding of discharge instructions, physician follow-up, medications.  Prescription given to patient.  Patient ambulated to bathroom.  Tolerated well.  Denies dizziness or lightheadedness.  Patient's IV removed.  Site WNL.  Patient stable awaiting discharge.

## 2018-03-19 ENCOUNTER — Telehealth: Payer: Self-pay

## 2018-03-19 NOTE — Progress Notes (Signed)
ED Antimicrobial Stewardship Positive Culture Follow Up   Cynthia [name] is an 77 y.o. female who presented to [facility] on 03/17/2018 with a chief complaint of dizziness Chief Complaint  Patient presents with  . Hypotension    Recent Results (from the past 720 hour(s))  Urine culture     Status: Abnormal   Collection Time: 03/16/18  1:31 PM  Result Value Ref Range Status   Specimen Description URINE, RANDOM  Final   Special Requests   Final    NONE Performed at [facility], [address] [place], [place] [address]    Culture >=100,000 COLONIES/mL ENTEROCOCCUS FAECALIS (A)  Final   Report Status 03/18/2018 FINAL  Final   Organism ID, Bacteria ENTEROCOCCUS FAECALIS (A)  Final      Susceptibility   Enterococcus faecalis - MIC*    AMPICILLIN <=2 SENSITIVE Sensitive     LEVOFLOXACIN 1 SENSITIVE Sensitive     NITROFURANTOIN <=16 SENSITIVE Sensitive     VANCOMYCIN 1 SENSITIVE Sensitive     * >=100,000 COLONIES/mL ENTEROCOCCUS FAECALIS  Urine culture     Status: Abnormal   Collection Time: 03/17/18  5:36 PM  Result Value Ref Range Status   Specimen Description   Final    URINE, CLEAN CATCH Performed at [facility], [place] [address] [place], [place] [address]    Special Requests   Final    Normal Performed at [facility], [place] [address], [place], [place] [address]    Culture (A)  Final    <10,000 COLONIES/mL INSIGNIFICANT GROWTH Performed at [place] [address] [place], [place] [address]    Report Status 03/18/2018 FINAL  Final    [x]  Treated with cephalexin, organism resistant to prescribed antimicrobial []  Patient discharged originally without antimicrobial agent and treatment is now indicated  New antibiotic prescription: Amoxicillin 875 mg IV twice daily x 5 days. Stop cephalexin   ED Provider: Rodell Perna, PA-C    Albertina Parr, PharmD., BCPS Clinical Pharmacist Clinical phone for 03/19/18 until  3:30pm: [phone] If after 3:30pm, please refer to [facility] for unit-specific pharmacist

## 2018-03-19 NOTE — Telephone Encounter (Signed)
Post ED Visit - Positive Culture Follow-up: Successful Patient Follow-Up  Culture assessed and recommendations reviewed by:  []  Elenor Quinones, Pharm.D. []  Heide Guile, Pharm.D., BCPS AQ-ID []  Parks Neptune, Pharm.D., BCPS []  Alycia Rossetti, Pharm.D., BCPS []  [employer], Pharm.D., BCPS, AAHIVP []  Legrand Como, Pharm.D., BCPS, AAHIVP []  Salome Arnt, PharmD, BCPS []  Johnnette Gourd, PharmD, BCPS []  Hughes Better, PharmD, BCPS []  Leeroy Cha, PharmD [facility] Pharm D Positive urine culture  []  Patient discharged without antimicrobial prescription and treatment is now indicated [x]  Organism is resistant to prescribed ED discharge antimicrobial []  Patient with positive blood cultures  Changes discussed with ED provider:Mina [facility] New antibiotic prescription Amoxicillin 875 mg twice daily x 5 days  Called to [facility] [phone]  Contacted patient, date 03/19/18, time 1026   Shannia Jacuinde Tollie Pizza 03/19/2018, 10:24 AM

## 2018-03-24 DIAGNOSIS — D649 Anemia, unspecified: Secondary | ICD-10-CM | POA: Diagnosis not present

## 2018-03-24 DIAGNOSIS — I951 Orthostatic hypotension: Secondary | ICD-10-CM | POA: Diagnosis not present

## 2018-03-24 DIAGNOSIS — N3000 Acute cystitis without hematuria: Secondary | ICD-10-CM | POA: Diagnosis not present

## 2018-03-24 DIAGNOSIS — G629 Polyneuropathy, unspecified: Secondary | ICD-10-CM | POA: Diagnosis not present

## 2018-03-24 DIAGNOSIS — K219 Gastro-esophageal reflux disease without esophagitis: Secondary | ICD-10-CM | POA: Diagnosis not present

## 2018-03-24 DIAGNOSIS — E0590 Thyrotoxicosis, unspecified without thyrotoxic crisis or storm: Secondary | ICD-10-CM | POA: Diagnosis not present

## 2018-03-24 DIAGNOSIS — I10 Essential (primary) hypertension: Secondary | ICD-10-CM | POA: Diagnosis not present

## 2018-03-24 DIAGNOSIS — I480 Paroxysmal atrial fibrillation: Secondary | ICD-10-CM | POA: Diagnosis not present

## 2018-03-24 DIAGNOSIS — G4733 Obstructive sleep apnea (adult) (pediatric): Secondary | ICD-10-CM | POA: Diagnosis not present

## 2018-03-24 DIAGNOSIS — M1990 Unspecified osteoarthritis, unspecified site: Secondary | ICD-10-CM | POA: Diagnosis not present

## 2018-03-25 DIAGNOSIS — M1990 Unspecified osteoarthritis, unspecified site: Secondary | ICD-10-CM | POA: Diagnosis not present

## 2018-03-25 DIAGNOSIS — K219 Gastro-esophageal reflux disease without esophagitis: Secondary | ICD-10-CM | POA: Diagnosis not present

## 2018-03-25 DIAGNOSIS — G4733 Obstructive sleep apnea (adult) (pediatric): Secondary | ICD-10-CM | POA: Diagnosis not present

## 2018-03-25 DIAGNOSIS — D649 Anemia, unspecified: Secondary | ICD-10-CM | POA: Diagnosis not present

## 2018-03-25 DIAGNOSIS — E0590 Thyrotoxicosis, unspecified without thyrotoxic crisis or storm: Secondary | ICD-10-CM | POA: Diagnosis not present

## 2018-03-25 DIAGNOSIS — I10 Essential (primary) hypertension: Secondary | ICD-10-CM | POA: Diagnosis not present

## 2018-03-25 DIAGNOSIS — I480 Paroxysmal atrial fibrillation: Secondary | ICD-10-CM | POA: Diagnosis not present

## 2018-03-25 DIAGNOSIS — G629 Polyneuropathy, unspecified: Secondary | ICD-10-CM | POA: Diagnosis not present

## 2018-03-25 DIAGNOSIS — I951 Orthostatic hypotension: Secondary | ICD-10-CM | POA: Diagnosis not present

## 2018-03-26 ENCOUNTER — Encounter (HOSPITAL_COMMUNITY): Payer: Self-pay | Admitting: Emergency Medicine

## 2018-03-26 ENCOUNTER — Other Ambulatory Visit: Payer: Self-pay

## 2018-03-26 ENCOUNTER — Observation Stay (HOSPITAL_COMMUNITY)
Admission: EM | Admit: 2018-03-26 | Discharge: 2018-03-28 | Disposition: A | Discharge status: Home / Self Care | Payer: Medicare PPO | Attending: Internal Medicine | Admitting: Internal Medicine

## 2018-03-26 DIAGNOSIS — N390 Urinary tract infection, site not specified: Secondary | ICD-10-CM | POA: Diagnosis not present

## 2018-03-26 DIAGNOSIS — R55 Syncope and collapse: Secondary | ICD-10-CM | POA: Diagnosis not present

## 2018-03-26 DIAGNOSIS — E785 Hyperlipidemia, unspecified: Secondary | ICD-10-CM | POA: Diagnosis not present

## 2018-03-26 DIAGNOSIS — Z87891 Personal history of nicotine dependence: Secondary | ICD-10-CM | POA: Diagnosis not present

## 2018-03-26 DIAGNOSIS — E860 Dehydration: Secondary | ICD-10-CM | POA: Diagnosis not present

## 2018-03-26 DIAGNOSIS — Z79899 Other long term (current) drug therapy: Secondary | ICD-10-CM | POA: Diagnosis not present

## 2018-03-26 DIAGNOSIS — G4733 Obstructive sleep apnea (adult) (pediatric): Secondary | ICD-10-CM | POA: Diagnosis present

## 2018-03-26 DIAGNOSIS — I10 Essential (primary) hypertension: Secondary | ICD-10-CM | POA: Diagnosis present

## 2018-03-26 DIAGNOSIS — K219 Gastro-esophageal reflux disease without esophagitis: Secondary | ICD-10-CM | POA: Diagnosis not present

## 2018-03-26 DIAGNOSIS — I480 Paroxysmal atrial fibrillation: Secondary | ICD-10-CM | POA: Diagnosis not present

## 2018-03-26 DIAGNOSIS — R42 Dizziness and giddiness: Secondary | ICD-10-CM | POA: Diagnosis not present

## 2018-03-26 DIAGNOSIS — I959 Hypotension, unspecified: Secondary | ICD-10-CM | POA: Diagnosis present

## 2018-03-26 DIAGNOSIS — Z85828 Personal history of other malignant neoplasm of skin: Secondary | ICD-10-CM | POA: Diagnosis not present

## 2018-03-26 DIAGNOSIS — Z7901 Long term (current) use of anticoagulants: Secondary | ICD-10-CM | POA: Insufficient documentation

## 2018-03-26 DIAGNOSIS — I444 Left anterior fascicular block: Secondary | ICD-10-CM | POA: Diagnosis not present

## 2018-03-26 LAB — CBC WITH DIFFERENTIAL/PLATELET
ABS IMMATURE GRANULOCYTES: 0.01 10*3/uL (ref 0.00–0.07)
Basophils Absolute: 0 10*3/uL (ref 0.0–0.1)
Basophils Relative: 1 %
Eosinophils Absolute: 0.1 10*3/uL (ref 0.0–0.5)
Eosinophils Relative: 3 %
HCT: 32.4 % — ABNORMAL LOW (ref 36.0–46.0)
Hemoglobin: 10.1 g/dL — ABNORMAL LOW (ref 12.0–15.0)
Immature Granulocytes: 0 %
Lymphocytes Relative: 27 %
Lymphs Abs: 1.3 10*3/uL (ref 0.7–4.0)
MCH: 30 pg (ref 26.0–34.0)
MCHC: 31.2 g/dL (ref 30.0–36.0)
MCV: 96.1 fL (ref 80.0–100.0)
MONO ABS: 0.7 10*3/uL (ref 0.1–1.0)
Monocytes Relative: 15 %
NEUTROS ABS: 2.6 10*3/uL (ref 1.7–7.7)
Neutrophils Relative %: 54 %
Platelets: 170 10*3/uL (ref 150–400)
RBC: 3.37 MIL/uL — ABNORMAL LOW (ref 3.87–5.11)
RDW: 12.8 % (ref 11.5–15.5)
WBC: 4.9 10*3/uL (ref 4.0–10.5)
nRBC: 0 % (ref 0.0–0.2)

## 2018-03-26 LAB — BASIC METABOLIC PANEL
Anion gap: 9 (ref 5–15)
BUN: 25 mg/dL — ABNORMAL HIGH (ref 8–23)
CO2: 27 mmol/L (ref 22–32)
Calcium: 8.9 mg/dL (ref 8.9–10.3)
Chloride: 104 mmol/L (ref 98–111)
Creatinine, Ser: 0.9 mg/dL (ref 0.44–1.00)
Glucose, Bld: 127 mg/dL — ABNORMAL HIGH (ref 70–99)
Potassium: 3.8 mmol/L (ref 3.5–5.1)
Sodium: 140 mmol/L (ref 135–145)

## 2018-03-26 LAB — I-STAT TROPONIN, ED: Troponin i, poc: 0 ng/mL (ref 0.00–0.08)

## 2018-03-26 MED ORDER — SODIUM CHLORIDE 0.9 % IV BOLUS
500.0000 mL | Freq: Once | INTRAVENOUS | Status: AC
  Administered 2018-03-26: 500 mL via INTRAVENOUS

## 2018-03-26 MED ORDER — SODIUM CHLORIDE 0.9 % IV BOLUS
1000.0000 mL | Freq: Once | INTRAVENOUS | Status: AC
  Administered 2018-03-26: 1000 mL via INTRAVENOUS

## 2018-03-26 NOTE — ED Notes (Signed)
PA Law at bed side.

## 2018-03-26 NOTE — ED Triage Notes (Addendum)
Pt reports feeling dizzy after dinner, hx of afib, no neuro deficits present.  Husband called his cardiologist this past week because of increasing bp's in the 130's who recommended pt start taking 50 mg losaartan again (took with breakfast), pt also took 120 mg of cardizem with dinner  66/36, HR 64, CBG 176 NSR, A&O x4.

## 2018-03-26 NOTE — ED Notes (Signed)
Pt's labs drawn sent down to the main-lab, notified Kayla(RN)

## 2018-03-26 NOTE — ED Notes (Addendum)
PA Law notified of hypotension. Pt mentating well, will continue to monitor until further instructions.

## 2018-03-27 ENCOUNTER — Observation Stay (HOSPITAL_BASED_OUTPATIENT_CLINIC_OR_DEPARTMENT_OTHER): Payer: Medicare PPO

## 2018-03-27 DIAGNOSIS — R55 Syncope and collapse: Secondary | ICD-10-CM | POA: Diagnosis not present

## 2018-03-27 DIAGNOSIS — E7800 Pure hypercholesterolemia, unspecified: Secondary | ICD-10-CM

## 2018-03-27 DIAGNOSIS — E860 Dehydration: Secondary | ICD-10-CM | POA: Diagnosis not present

## 2018-03-27 DIAGNOSIS — I10 Essential (primary) hypertension: Secondary | ICD-10-CM | POA: Diagnosis not present

## 2018-03-27 DIAGNOSIS — K219 Gastro-esophageal reflux disease without esophagitis: Secondary | ICD-10-CM | POA: Diagnosis not present

## 2018-03-27 DIAGNOSIS — R42 Dizziness and giddiness: Secondary | ICD-10-CM

## 2018-03-27 DIAGNOSIS — I959 Hypotension, unspecified: Secondary | ICD-10-CM

## 2018-03-27 DIAGNOSIS — G4733 Obstructive sleep apnea (adult) (pediatric): Secondary | ICD-10-CM

## 2018-03-27 LAB — CBC
HCT: 33 % — ABNORMAL LOW (ref 36.0–46.0)
Hemoglobin: 10.6 g/dL — ABNORMAL LOW (ref 12.0–15.0)
MCH: 30.3 pg (ref 26.0–34.0)
MCHC: 32.1 g/dL (ref 30.0–36.0)
MCV: 94.3 fL (ref 80.0–100.0)
NRBC: 0 % (ref 0.0–0.2)
Platelets: 166 10*3/uL (ref 150–400)
RBC: 3.5 MIL/uL — ABNORMAL LOW (ref 3.87–5.11)
RDW: 12.8 % (ref 11.5–15.5)
WBC: 4.7 10*3/uL (ref 4.0–10.5)

## 2018-03-27 LAB — BASIC METABOLIC PANEL
ANION GAP: 11 (ref 5–15)
BUN: 15 mg/dL (ref 8–23)
CO2: 25 mmol/L (ref 22–32)
Calcium: 8.6 mg/dL — ABNORMAL LOW (ref 8.9–10.3)
Chloride: 107 mmol/L (ref 98–111)
Creatinine, Ser: 0.64 mg/dL (ref 0.44–1.00)
GFR calc Af Amer: >60 mL/min (ref >60)
GFR calc non Af Amer: >60 mL/min (ref >60)
Glucose, Bld: 109 mg/dL — ABNORMAL HIGH (ref 70–99)
Potassium: 3.4 mmol/L — ABNORMAL LOW (ref 3.5–5.1)
Sodium: 143 mmol/L (ref 135–145)

## 2018-03-27 LAB — URINALYSIS, ROUTINE W REFLEX MICROSCOPIC
Bilirubin Urine: NEGATIVE
Glucose, UA: NEGATIVE mg/dL
Hgb urine dipstick: NEGATIVE
Ketones, ur: NEGATIVE mg/dL
Nitrite: NEGATIVE
Protein, ur: NEGATIVE mg/dL
Specific Gravity, Urine: 1.015 (ref 1.005–1.030)
pH: 6 (ref 5.0–8.0)

## 2018-03-27 LAB — TSH: TSH: 1.23 u[IU]/mL (ref 0.350–4.500)

## 2018-03-27 MED ORDER — THYROID 60 MG PO TABS
60.0000 mg | ORAL_TABLET | Freq: Every day | ORAL | Status: DC
  Administered 2018-03-27 – 2018-03-28 (×2): 60 mg via ORAL
  Filled 2018-03-27 (×2): qty 1

## 2018-03-27 MED ORDER — DULOXETINE HCL 30 MG PO CPEP
30.0000 mg | ORAL_CAPSULE | Freq: Two times a day (BID) | ORAL | Status: DC
  Administered 2018-03-27 – 2018-03-28 (×3): 30 mg via ORAL
  Filled 2018-03-27 (×3): qty 1

## 2018-03-27 MED ORDER — SODIUM CHLORIDE 0.9 % IV SOLN
INTRAVENOUS | Status: DC
  Administered 2018-03-27: 05:00:00 via INTRAVENOUS

## 2018-03-27 MED ORDER — ACETAMINOPHEN 650 MG RE SUPP: 650.0000 mg | Freq: Four times a day (QID) | RECTAL | Status: DC | PRN

## 2018-03-27 MED ORDER — LEVOFLOXACIN 500 MG PO TABS
500.0000 mg | ORAL_TABLET | Freq: Every day | ORAL | Status: DC
  Administered 2018-03-27 – 2018-03-28 (×2): 500 mg via ORAL
  Filled 2018-03-27 (×2): qty 1

## 2018-03-27 MED ORDER — DILTIAZEM HCL ER COATED BEADS 120 MG PO CP24
120.0000 mg | ORAL_CAPSULE | Freq: Every evening | ORAL | Status: DC
  Administered 2018-03-27: 120 mg via ORAL
  Filled 2018-03-27: qty 1

## 2018-03-27 MED ORDER — SIMETHICONE 80 MG PO CHEW: 80.0000 mg | CHEWABLE_TABLET | Freq: Four times a day (QID) | ORAL | Status: DC | PRN

## 2018-03-27 MED ORDER — APIXABAN 5 MG PO TABS
5.0000 mg | ORAL_TABLET | Freq: Two times a day (BID) | ORAL | Status: DC
  Administered 2018-03-27 – 2018-03-28 (×3): 5 mg via ORAL
  Filled 2018-03-27 (×3): qty 1

## 2018-03-27 MED ORDER — SODIUM CHLORIDE 0.9 % IV SOLN
1.0000 g | INTRAVENOUS | Status: DC
  Administered 2018-03-27: 1 g via INTRAVENOUS
  Filled 2018-03-27: qty 10

## 2018-03-27 MED ORDER — NITROGLYCERIN 0.4 MG/SPRAY TL SOLN: 1.0000 | Status: DC | PRN

## 2018-03-27 MED ORDER — LABETALOL HCL 5 MG/ML IV SOLN
10.0000 mg | Freq: Once | INTRAVENOUS | Status: AC
  Administered 2018-03-27: 10 mg via INTRAVENOUS
  Filled 2018-03-27: qty 4

## 2018-03-27 MED ORDER — SODIUM CHLORIDE 0.9 % IV SOLN: INTRAVENOUS | Status: AC

## 2018-03-27 MED ORDER — HYDRALAZINE HCL 20 MG/ML IJ SOLN: 10.0000 mg | Freq: Four times a day (QID) | INTRAMUSCULAR | Status: DC | PRN

## 2018-03-27 MED ORDER — POTASSIUM CHLORIDE CRYS ER 20 MEQ PO TBCR
40.0000 meq | EXTENDED_RELEASE_TABLET | Freq: Once | ORAL | Status: AC
  Administered 2018-03-27: 40 meq via ORAL
  Filled 2018-03-27: qty 2

## 2018-03-27 MED ORDER — BUPROPION HCL ER (XL) 150 MG PO TB24
150.0000 mg | ORAL_TABLET | Freq: Every day | ORAL | Status: DC
  Administered 2018-03-27 – 2018-03-28 (×2): 150 mg via ORAL
  Filled 2018-03-27 (×2): qty 1

## 2018-03-27 MED ORDER — PANTOPRAZOLE SODIUM 40 MG PO TBEC
40.0000 mg | DELAYED_RELEASE_TABLET | Freq: Every day | ORAL | Status: DC
  Administered 2018-03-27 – 2018-03-28 (×2): 40 mg via ORAL
  Filled 2018-03-27 (×2): qty 1

## 2018-03-27 MED ORDER — ACETAMINOPHEN 325 MG PO TABS: 650.0000 mg | ORAL_TABLET | Freq: Four times a day (QID) | ORAL | Status: DC | PRN

## 2018-03-27 MED ORDER — FLECAINIDE ACETATE 50 MG PO TABS
25.0000 mg | ORAL_TABLET | Freq: Two times a day (BID) | ORAL | Status: DC
  Administered 2018-03-27 – 2018-03-28 (×3): 25 mg via ORAL
  Filled 2018-03-27 (×3): qty 1

## 2018-03-27 MED ORDER — POLYETHYLENE GLYCOL 3350 17 G PO PACK
17.0000 g | PACK | Freq: Every day | ORAL | Status: DC | PRN
  Administered 2018-03-27: 17 g via ORAL
  Filled 2018-03-27: qty 1

## 2018-03-27 MED ORDER — LOSARTAN POTASSIUM 50 MG PO TABS
50.0000 mg | ORAL_TABLET | Freq: Every day | ORAL | Status: DC
  Administered 2018-03-27 – 2018-03-28 (×2): 50 mg via ORAL
  Filled 2018-03-27 (×2): qty 1

## 2018-03-27 NOTE — ED Notes (Signed)
Pt alert and oriented x4. Pt states is ambulatory at [place] with steady gait using a cane. Pt responded well to fluids, BP WDL. Pt denies dizziness. Pt however has not attempted to ambulate to see if dizziness returns when standing. Husband at bedside, supportive and calm. Pt resting. No acute sign of distress noted. Strong strength bilaterally in upper and lower extremities. Pt is unable to provide urine sample.

## 2018-03-27 NOTE — ED Notes (Signed)
Pt bladder scanned. Pt states is a known problem that she retains urine. States sometimes "leak, and wears pads."

## 2018-03-27 NOTE — H&P (Signed)
PCP:   Deland Pretty, MD   Chief Complaint:  Hypotension and presyncope  HPI: this is 77 y/o female with h/o dizziness and presyncope. She was walking from the kitchen to the bathroom and became very dizzy. Her speech became slurred. She was a bit confused and sluggish. EMS checked her blood pressure and it was 66/?. In the ER she remained hypotensive. She has received IVF and currently her BP is 105/50.  She has not been ill, no nausea, vominting or diarrhea.  She has a h/o recurrent UTI's. She was being cath periodically in an effort to prevent recurrent UTI's per her husband. She has been having some urinary retension recently. She reports some BOU a few weeks ago which stopped. She started burning again today. She reports some chills but no fevers. She was maintained on macrobid, cipro and cephalxin prophylactically. Currently she is maintained on trimetoprim daily.  The patient is in a study for patients with recurrent UTI's.  April McCaskill [phone] is the person with whom she works in the study.  She has been to the ER 3 times this week with c/o dizziness secondary to low blood pressure. This is a chronic recurring issue. Her physicians have been adjusting her medications in an effort to improve this.  History provided by the patient and her husband who is present at bedside.  Review of Systems:  The patient denies anorexia, fever, weight loss, presyncope, vision loss, decreased hearing, hoarseness, chest pain, syncope, dyspnea on exertion, peripheral edema, balance deficits, hemoptysis, abdominal pain, melena, hematochezia, severe indigestion/heartburn, hematuria, incontinence, genital sores, muscle weakness, suspicious skin lesions, transient blindness, difficulty walking, depression, unusual weight change, abnormal bleeding, enlarged lymph nodes, angioedema, and breast masses.  Past Medical History: Past Medical History:  Diagnosis Date  . A-fib ([place])   . Anemia   . Arthritis     "joints" (11/10/2017)  . GERD (gastroesophageal reflux disease)   . Hearing loss   . History of blood transfusion 10/2017  . Hypertension   . Hyperthyroidism   . Melanoma ([place])    "cut off my back"  . OSA on CPAP   . Sinus headache    Past Surgical History:  Procedure Laterality Date  . ABDOMINAL HYSTERECTOMY     "partial"  . APPENDECTOMY    . BIOPSY  11/17/2017   Procedure: BIOPSY;  Surgeon: Clarene Essex, MD;  Location: [facility];  Service: Endoscopy;;  . COLONOSCOPY WITH PROPOFOL N/A 11/17/2017   Procedure: COLONOSCOPY WITH PROPOFOL;  Surgeon: Clarene Essex, MD;  Location: [facility];  Service: Endoscopy;  Laterality: N/A;  . CRANIECTOMY FOR EXCISION OF ACOUSTIC NEUROMA    . ESOPHAGOGASTRODUODENOSCOPY (EGD) WITH PROPOFOL N/A 10/28/2017   Procedure: ESOPHAGOGASTRODUODENOSCOPY (EGD) WITH PROPOFOL;  Surgeon: Clarene Essex, MD;  [facility];  Service: Endoscopy;  Laterality: N/A;  . KNEE ARTHROSCOPY Left   . LEFT HEART CATH AND CORONARY ANGIOGRAPHY N/A 11/24/2016   Procedure: LEFT HEART CATH AND CORONARY ANGIOGRAPHY;  Surgeon: Jettie Booze, MD;  Location: [facility];  Service: Cardiovascular;  Laterality: N/A;  . MELANOMA EXCISION     "off my back"  . NASAL SINUS SURGERY    . TONSILLECTOMY      Medications: Prior to Admission medications   Medication Sig Start Date End Date Taking? Authorizing Provider  acetaminophen (TYLENOL) 500 MG tablet Take 1,000 mg by mouth 2 (two) times daily.   Yes [provider]  Alpha-Lipoic Acid (LIPOIC ACID PO) Take 600 mg by mouth 2 (  two) times daily.    Yes [provider]  Alpha-Lipoic Acid 600 MG CAPS Take 1,200 mg by mouth daily.   Yes [provider]  atorvastatin (LIPITOR) 10 MG tablet TAKE 1 TABLET BY MOUTH EVERY DAY AT 6PM *NEED OFFICE VISIT* Patient taking differently: Take 10 mg by mouth daily at 6 PM.  03/07/18  Yes Crenshaw, Denice Bors, MD  buPROPion (WELLBUTRIN XL) 150 MG 24 hr tablet Take  150 mg by mouth daily.  02/17/18  Yes [provider]  CALCIUM PO Take 600 mg by mouth 2 (two) times daily.    Yes [provider]  cetirizine (ZYRTEC) 10 MG tablet Take 10 mg by mouth daily.   Yes [provider]  cholecalciferol (VITAMIN D) 1000 UNITS tablet Take 1,000 Units by mouth daily.   Yes [provider]  CRANBERRY CONCENTRATE PO Take 650 mg by mouth daily.    Yes [provider]  cycloSPORINE (RESTASIS) 0.05 % ophthalmic emulsion Place 1 drop into both eyes 2 (two) times daily.   Yes [provider]  diltiazem (CARDIZEM CD) 120 MG 24 hr capsule TAKE 1 CAPSULE BY MOUTH EVERY DAY *NEED OFFICE VISIT* Patient taking differently: Take 120 mg by mouth every evening.  03/07/18  Yes Lelon Perla, MD  DULoxetine (CYMBALTA) 30 MG capsule TAKE 1 CAPSULE (30 MG TOTAL) BY MOUTH 2 (TWO) TIMES DAILY. 09/09/17  Yes Sater, Nanine Means, MD  ELIQUIS 5 MG TABS tablet Take 5 mg by mouth 2 (two) times daily. 03/06/18  Yes [provider]  flecainide (TAMBOCOR) 50 MG tablet Take 0.5 tablets (25 mg total) by mouth 2 (two) times daily. 03/18/18  Yes Regalado, Belkys A, MD  GLUCOSAMINE-CHONDROITIN PO Take 2 tablets by mouth daily with lunch. Glucosamine 1500mg  Chondroitin 1200mg    Yes [provider]  losartan (COZAAR) 50 MG tablet Take 1 tablet (50 mg total) by mouth daily. 11/12/17 03/26/26 Yes [name], [name], MD  Lutein-Zeaxanthin 25-5 MG CAPS Take 1 tablet by mouth daily.   Yes [provider]  Melatonin 5 MG TABS Take 5 mg by mouth at bedtime.   Yes [provider]  Multiple Vitamin (MULTIVITAMIN WITH MINERALS) TABS tablet Take 1 tablet by mouth daily.   Yes [provider]  nitroGLYCERIN (NITROLINGUAL) 0.4 MG/SPRAY spray Place 1 spray under the tongue every 5 (five) minutes x 3 doses as needed for chest pain.   Yes [provider]  Omega-3 1000 MG CAPS Take 1,000 mg by mouth daily.   Yes  [provider]  pantoprazole (PROTONIX) 40 MG tablet Take 40 mg by mouth daily before breakfast.  06/12/14  Yes [provider]  simethicone (MYLICON) 80 MG chewable tablet Chew 80 mg by mouth every 6 (six) hours as needed for flatulence.   Yes [provider]  thyroid (ARMOUR) 60 MG tablet Take 60 mg by mouth daily before breakfast. 11/07/17  Yes [provider]  tiZANidine (ZANAFLEX) 4 MG tablet TAKE 1 TABLET BY MOUTH THREE TIMES A DAY AS NEEDED Patient taking differently: Take 2 mg by mouth every 8 (eight) hours as needed for muscle spasms.  08/04/17  Yes Sater, Nanine Means, MD  trimethoprim (TRIMPEX) 100 MG tablet Take 100 mg by mouth daily.   Yes [provider]  vitamin C (ASCORBIC ACID) 500 MG tablet Take 1,000 mg by mouth daily.    Yes [provider]  cephALEXin (KEFLEX) 500 MG capsule Take 1 capsule (500 mg total) by  mouth 4 (four) times daily. Patient not taking: Reported on 03/26/2018 03/16/18   Veryl Speak, MD  ranitidine (ZANTAC) 150 MG tablet Take 1 tablet (150 mg total) by mouth 2 (two) times daily. Patient not taking: Reported on 03/26/2018 12/22/16 03/26/26  Duffy Bruce, MD    Allergies:  No Known Allergies  Social History:  reports that she quit smoking about 39 years ago. Her smoking use included cigarettes. She quit after 2.00 years of use. She has never used smokeless tobacco. She reports current alcohol use. She reports that she does not use drugs.  Family History: Family History  Problem Relation Age of Onset  . Congestive Heart Failure Mother   . Heart disease Father        History of heart attacks at a later age.      Physical Exam: Vitals:   03/26/18 2300 03/26/18 2330 03/26/18 2345 03/27/18 0000  BP: (!) 105/55 115/65 118/64   Pulse: 61 68 71 72  Resp: (!) 22 17 17  (!) 24  Temp:      TempSrc:      SpO2: 97% 94% 93% 94%  Weight:      Height:        General:  Alert and oriented times three, well  developed and nourished, sluggish female Eyes: PERRLA, pink conjunctiva, no scleral icterus ENT: Moist oral mucosa, neck supple, no thyromegaly Lungs: clear to ascultation, no wheeze, no crackles, no use of accessory muscles Cardiovascular: regular rate and rhythm, no regurgitation, no gallops, no murmurs. No carotid bruits, no JVD Abdomen: soft, positive BS, non-tender, non-distended, no organomegaly, not an acute abdomen GU: not examined Neuro: CN II - XII grossly intact, sensation intact Musculoskeletal: strength 5/5 all extremities, no clubbing, cyanosis or edema Skin: no rash, no subcutaneous crepitation, no decubitus Psych: appropriate patient   Labs on Admission:  Recent Labs    03/26/18 2211  NA 140  K 3.8  CL 104  CO2 27  GLUCOSE 127*  BUN 25*  CREATININE 0.90  CALCIUM 8.9   No results for input(s): AST, ALT, ALKPHOS, BILITOT, PROT, ALBUMIN in the last 72 hours. No results for input(s): LIPASE, AMYLASE in the last 72 hours. Recent Labs    03/26/18 2211  WBC 4.9  NEUTROABS 2.6  HGB 10.1*  HCT 32.4*  MCV 96.1  PLT 170   No results for input(s): CKTOTAL, CKMB, CKMBINDEX, TROPONINI in the last 72 hours. Invalid input(s): POCBNP No results for input(s): DDIMER in the last 72 hours. No results for input(s): HGBA1C in the last 72 hours. No results for input(s): CHOL, HDL, LDLCALC, TRIG, CHOLHDL, LDLDIRECT in the last 72 hours. No results for input(s): TSH, T4TOTAL, T3FREE, THYROIDAB in the last 72 hours.  Invalid input(s): FREET3 No results for input(s): VITAMINB12, FOLATE, FERRITIN, TIBC, IRON, RETICCTPCT in the last 72 hours.  Micro Results: Recent Results (from the past 240 hour(s))  Urine culture     Status: Abnormal   Collection Time: 03/17/18  5:36 PM  Result Value Ref Range Status   Specimen Description   Final    URINE, CLEAN CATCH Performed at [facility], [place] [address], [place], [place] [address]    Special Requests   Final     Normal Performed at [facility], [place] [address] [place], [place] [address]    Culture (A)  Final    <10,000 COLONIES/mL INSIGNIFICANT GROWTH Performed at [facility] [address] [place], [place] [address]  Report Status 03/18/2018 FINAL  Final     Radiological Exams on Admission: No results found.  Assessment/Plan Present on Admission: Recurrent presyncopal episodes with associated hypotension -Bring in for overnight observation med telemetry -Patient has responded favorably to IV fluid boluses.  She has had 1.5 L currently her systolic blood pressure is 105.  We will continue gentle IV fluid hydration overnight -Unclear if due to UTI.  UA is been ordered and is pending.  If patient is unable to go will order in out cath.  No antibiotics ordered for now.  Will initiate if UA is positive -Repeat orthostatic vitals in a.m. -Patient has been seen by cardiology.  Question also autonomic dysfunction, with patient back suspect candidate for Florinef.  . Hyperlipidemia -Stable, home medications held  . OSA (obstructive sleep apnea) -CPAP ordered  . PAF (paroxysmal atrial fibrillation) (HCC) -Stable, diltiazem continue with hold parameters.  Hold for systolic blood pressure less than 120  . Essential hypertension, benign -See above.  Diltiazem and losartan have been continued with hold parameters  . GERD (gastroesophageal reflux disease) -Stable, home meds resumed  Xavier Fournier 03/27/2018, 12:21 AM

## 2018-03-27 NOTE — ED Provider Notes (Signed)
[facility] EMERGENCY DEPARTMENT Provider Note   CSN: [id number] Arrival date & time: 03/26/18  2157     History   Chief Complaint Chief Complaint  Patient presents with  . Dizziness  . Hypotension    HPI Cynthia [name] is a 77 y.o. female with history of postural dizziness with presyncope, PAF, orthostatic hypotension, chronic UTI who presents after an episode of hypotension and near syncope tonight.  This began after taking Cardizem.  Patient was recently hospitalized for the same problem, however given her new medication regimen, patient is still having episodes where her blood pressure is dropping very low.  EMS reports blood pressure drops 66/36.  Patient's husband reported patient sounded drunk when this was happening, however patient did not notice any slurred speech.  I do not notice any slurred speech when the patient is talking to me.  She denies any headache, chest pain, shortness of breath, abdominal pain, nausea, vomiting, weakness, numbness or tingling.  Patient does note that she was seeing red spots in the ambulance, however this is resolved.  Patient is finishing up Macrobid for UTI.  She is feeling better with her symptoms.  HPI  Past Medical History:  Diagnosis Date  . A-fib ([place])   . Anemia   . Arthritis    "joints" (11/10/2017)  . GERD (gastroesophageal reflux disease)   . Hearing loss   . History of blood transfusion 10/2017  . Hypertension   . Hyperthyroidism   . Melanoma ([place])    "cut off my back"  . OSA on CPAP   . Sinus headache     Patient Active Problem List   Diagnosis Date Noted  . Hypotension 03/17/2018  . Postural dizziness with presyncope 03/17/2018  . PAF (paroxysmal atrial fibrillation) ([place]) 03/17/2018  . Memory loss 11/25/2017  . GI bleed 11/06/2017  . Acute lower UTI 10/29/2017  . Acute GI bleeding 10/27/2017  . Acute blood loss anemia 10/27/2017  . Abnormal stress test   . Chest pain 11/23/2016  .  Hyperlipidemia 11/23/2016  . GERD (gastroesophageal reflux disease) 11/23/2016  . History of recurrent UTIs 11/23/2016  . Polyneuropathy 10/30/2016  . Bursitis, subacromial 10/30/2016  . Numbness 10/30/2016  . Arthritis, senescent 01/03/2016  . Left knee pain 01/03/2016  . Pain in joint, ankle and foot 02/28/2015  . Dry mouth 08/07/2014  . Dry eyes 08/07/2014  . Other headache syndrome 06/25/2014  . Sinusitis, chronic 06/25/2014  . Neck pain 06/25/2014  . OSA (obstructive sleep apnea) 06/25/2014  . Essential hypertension, benign 06/25/2014    Past Surgical History:  Procedure Laterality Date  . ABDOMINAL HYSTERECTOMY     "partial"  . APPENDECTOMY    . BIOPSY  11/17/2017   Procedure: BIOPSY;  Surgeon: Clarene Essex, MD;  [facility];  Service: Endoscopy;;  . COLONOSCOPY WITH PROPOFOL N/A 11/17/2017   Procedure: COLONOSCOPY WITH PROPOFOL;  Surgeon: Clarene Essex, MD;  Location[facility];  Service: Endoscopy;  Laterality: N/A;  . CRANIECTOMY FOR EXCISION OF ACOUSTIC NEUROMA    . ESOPHAGOGASTRODUODENOSCOPY (EGD) WITH PROPOFOL N/A 10/28/2017   Procedure: ESOPHAGOGASTRODUODENOSCOPY (EGD) WITH PROPOFOL;  Surgeon: Clarene Essex, MD;  [facility];  Service: Endoscopy;  Laterality: N/A;  . KNEE ARTHROSCOPY Left   . LEFT HEART CATH AND CORONARY ANGIOGRAPHY N/A 11/24/2016   Procedure: LEFT HEART CATH AND CORONARY ANGIOGRAPHY;  Surgeon: Jettie Booze, MD;  Location: [facility];  Service: Cardiovascular;  Laterality: N/A;  . MELANOMA EXCISION     "  off my back"  . NASAL SINUS SURGERY    . TONSILLECTOMY       OB History   No obstetric history on file.      Home Medications    Prior to Admission medications   Medication Sig Start Date End Date Taking? Authorizing Provider  acetaminophen (TYLENOL) 500 MG tablet Take 1,000 mg by mouth 2 (two) times daily.   Yes [provider]  Alpha-Lipoic Acid (LIPOIC ACID PO) Take 600 mg by mouth 2 (two) times  daily.    Yes [provider]  Alpha-Lipoic Acid 600 MG CAPS Take 1,200 mg by mouth daily.   Yes [provider]  atorvastatin (LIPITOR) 10 MG tablet TAKE 1 TABLET BY MOUTH EVERY DAY AT 6PM *NEED OFFICE VISIT* Patient taking differently: Take 10 mg by mouth daily at 6 PM.  03/07/18  Yes Crenshaw, Denice Bors, MD  buPROPion (WELLBUTRIN XL) 150 MG 24 hr tablet Take 150 mg by mouth daily.  02/17/18  Yes [provider]  CALCIUM PO Take 600 mg by mouth 2 (two) times daily.    Yes [provider]  cetirizine (ZYRTEC) 10 MG tablet Take 10 mg by mouth daily.   Yes [provider]  cholecalciferol (VITAMIN D) 1000 UNITS tablet Take 1,000 Units by mouth daily.   Yes [provider]  CRANBERRY CONCENTRATE PO Take 650 mg by mouth daily.    Yes [provider]  cycloSPORINE (RESTASIS) 0.05 % ophthalmic emulsion Place 1 drop into both eyes 2 (two) times daily.   Yes [provider]  diltiazem (CARDIZEM CD) 120 MG 24 hr capsule TAKE 1 CAPSULE BY MOUTH EVERY DAY *NEED OFFICE VISIT* Patient taking differently: Take 120 mg by mouth every evening.  03/07/18  Yes Lelon Perla, MD  DULoxetine (CYMBALTA) 30 MG capsule TAKE 1 CAPSULE (30 MG TOTAL) BY MOUTH 2 (TWO) TIMES DAILY. 09/09/17  Yes Sater, Nanine Means, MD  ELIQUIS 5 MG TABS tablet Take 5 mg by mouth 2 (two) times daily. 03/06/18  Yes [provider]  flecainide (TAMBOCOR) 50 MG tablet Take 0.5 tablets (25 mg total) by mouth 2 (two) times daily. 03/18/18  Yes Regalado, Belkys A, MD  GLUCOSAMINE-CHONDROITIN PO Take 2 tablets by mouth daily with lunch. Glucosamine 1500mg  Chondroitin 1200mg    Yes [provider]  losartan (COZAAR) 50 MG tablet Take 1 tablet (50 mg total) by mouth daily. 11/12/17 03/26/26 Yes [name], [name], MD  Lutein-Zeaxanthin 25-5 MG CAPS Take 1 tablet by mouth daily.   Yes [provider]  Melatonin 5 MG TABS Take 5 mg by mouth at bedtime.   Yes  [provider]  Multiple Vitamin (MULTIVITAMIN WITH MINERALS) TABS tablet Take 1 tablet by mouth daily.   Yes [provider]  nitroGLYCERIN (NITROLINGUAL) 0.4 MG/SPRAY spray Place 1 spray under the tongue every 5 (five) minutes x 3 doses as needed for chest pain.   Yes [provider]  Omega-3 1000 MG CAPS Take 1,000 mg by mouth daily.   Yes [provider]  pantoprazole (PROTONIX) 40 MG tablet Take 40 mg by mouth daily before breakfast.  06/12/14  Yes [provider]  simethicone (MYLICON) 80 MG chewable tablet Chew 80 mg by mouth every 6 (six) hours as needed for flatulence.   Yes [provider]  thyroid (ARMOUR) 60 MG tablet Take 60 mg by mouth daily before breakfast. 11/07/17  Yes [provider]  tiZANidine (ZANAFLEX) 4 MG tablet TAKE 1  TABLET BY MOUTH THREE TIMES A DAY AS NEEDED Patient taking differently: Take 2 mg by mouth every 8 (eight) hours as needed for muscle spasms.  08/04/17  Yes Sater, Nanine Means, MD  trimethoprim (TRIMPEX) 100 MG tablet Take 100 mg by mouth daily.   Yes [provider]  vitamin C (ASCORBIC ACID) 500 MG tablet Take 1,000 mg by mouth daily.    Yes [provider]  cephALEXin (KEFLEX) 500 MG capsule Take 1 capsule (500 mg total) by mouth 4 (four) times daily. Patient not taking: Reported on 03/26/2018 03/16/18   Veryl Speak, MD  ranitidine (ZANTAC) 150 MG tablet Take 1 tablet (150 mg total) by mouth 2 (two) times daily. Patient not taking: Reported on 03/26/2018 12/22/16 03/26/26  Duffy Bruce, MD    Family History Family History  Problem Relation Age of Onset  . Congestive Heart Failure Mother   . Heart disease Father        History of heart attacks at a later age.      Social History Social History   Tobacco Use  . Smoking status: Former Smoker    Years: 2.00    Types: Cigarettes    Last attempt to quit: 1980    Years since quitting: 39.9  . Smokeless tobacco: Never  Used  . Tobacco comment: 11/10/2017 "only smoked when I was on my period"  Substance Use Topics  . Alcohol use: Yes    Comment: 11/10/2017 "nothing lately; did have 1 glass of wine q night"  . Drug use: Never     Allergies   Patient has no known allergies.   Review of Systems Review of Systems  Constitutional: Negative for chills and fever.  HENT: Negative for facial swelling and sore throat.   Respiratory: Negative for shortness of breath.   Cardiovascular: Negative for chest pain.  Gastrointestinal: Negative for abdominal pain, nausea and vomiting.  Genitourinary: Negative for dysuria.  Musculoskeletal: Negative for back pain.  Skin: Negative for rash and wound.  Neurological: Positive for light-headedness. Negative for syncope (near) and headaches.  Psychiatric/Behavioral: The patient is not nervous/anxious.      Physical Exam Updated Vital Signs BP 118/64   Pulse 72   Temp 98.1 F (36.7 C) (Oral)   Resp (!) 24   Ht 5\' 5"  (1.651 m)   Wt 70 kg   SpO2 94%   BMI 25.68 kg/m   Physical Exam Vitals signs and nursing note reviewed.  Constitutional:      General: She is not in acute distress.    Appearance: She is well-developed. She is not diaphoretic.  HENT:     Head: Normocephalic and atraumatic.     Mouth/Throat:     Pharynx: No oropharyngeal exudate.  Eyes:     General: No scleral icterus.       Right eye: No discharge.        Left eye: No discharge.     Conjunctiva/sclera: Conjunctivae normal.     Pupils: Pupils are equal, round, and reactive to light.  Neck:     Musculoskeletal: Normal range of motion and neck supple.     Thyroid: No thyromegaly.  Cardiovascular:     Rate and Rhythm: Normal rate and regular rhythm.     Heart sounds: Normal heart sounds. No murmur. No friction rub. No gallop.   Pulmonary:     Effort: Pulmonary effort is normal. No respiratory distress.     Breath sounds: Normal breath sounds. No stridor. No wheezing  or rales.    Abdominal:     General: Bowel sounds are normal. There is no distension.     Palpations: Abdomen is soft.     Tenderness: There is no abdominal tenderness. There is no guarding or rebound.  Lymphadenopathy:     Cervical: No cervical adenopathy.  Skin:    General: Skin is warm and dry.     Coloration: Skin is not pale.     Findings: No rash.  Neurological:     Mental Status: She is alert.     Coordination: Coordination normal.     Comments: CN 3-12 intact; normal sensation throughout; 5/5 strength in all 4 extremities; equal bilateral grip strength; no ataxia on finger-to-nose      ED Treatments / Results  Labs (all labs ordered are listed, but only abnormal results are displayed) Labs Reviewed  BASIC METABOLIC PANEL - Abnormal; Notable for the following components:      Result Value   Glucose, Bld 127 (*)    BUN 25 (*)    All other components within normal limits  CBC WITH DIFFERENTIAL/PLATELET - Abnormal; Notable for the following components:   RBC 3.37 (*)    Hemoglobin 10.1 (*)    HCT 32.4 (*)    All other components within normal limits  URINALYSIS, ROUTINE W REFLEX MICROSCOPIC  I-STAT TROPONIN, ED    EKG EKG Interpretation  Date/Time:  Saturday March 26 2018 22:04:06 EST Ventricular Rate:  66 PR Interval:    QRS Duration: 114 QT Interval:  462 QTC Calculation: 485 R Axis:   -70 Text Interpretation:  Sinus rhythm Left anterior fascicular block Low voltage, extremity leads Nonspecific T abnormalities, lateral leads No acute changes Confirmed by Varney Biles [phone]) on 03/26/2018 11:42:40 PM   Radiology No results found.  Procedures Procedures (including critical care time)  Medications Ordered in ED Medications  sodium chloride 0.9 % bolus 500 mL (500 mLs Intravenous New Bag/Given 03/26/18 2357)  sodium chloride 0.9 % bolus 1,000 mL (0 mLs Intravenous Stopped 03/26/18 2330)     Initial Impression / Assessment and Plan / ED Course  I have  reviewed the triage vital signs and the nursing notes.  Pertinent labs & imaging results that were available during my care of the patient were reviewed by me and considered in my medical decision making (see chart for details).     Patient with history of orthostatic hypertension presenting with significant hypotension which is symptomatic.  Patient had near syncope.  She was lying on the kitchen floor.  Pressures increased in the ED with IV fluids, however patient and husband are concerned about recurrent episodes and having to frequent the emergency department a lot.  Labs are stable for the patient.  EKG shows NSR with left anterior fascicular block, nonspecific T wave abnormalities, but no acute changes.  I discussed patient case with Dr. Claria Dice with [facility] who accepts patient for admission and further work-up.  I appreciate her assistance with the patient.  Patient also evaluated by my attending, Dr. Kathrynn Humble, who got with the patient's management and agrees with plan.  Final Clinical Impressions(s) / ED Diagnoses   Final diagnoses:  Near syncope  Hypotension, unspecified hypotension type    ED Discharge Orders    None       Frederica Kuster, PA-C 03/27/18 0029    Varney Biles, MD 03/27/18 2309

## 2018-03-27 NOTE — Evaluation (Signed)
Physical Therapy Evaluation Patient Details Name: Cynthia [name] MRN: [id number] DOB: 1941-02-18 Today's Date: 03/27/2018   History of Present Illness  77 year old Caucasian female with paroxysmal atrial fibrillation, normal coronary arteries by cardiac catheterization 2018, obstructive sleep apnea on CPAP, hypertension, hyperlipidemia, chronic fatigue and memory loss admitted to the hospital with near syncope, dizziness and slurred speech. History of recent and recurring UTI.  Recently discharged with Home health services.  Clinical Impression  Orders received for PT evaluation. Patient demonstrates deficits in functional mobility as indicated below. Will benefit from continued skilled PT to address deficits and maximize function. Will see as indicated and progress as tolerated.  Patient was receiving HHPT after recent admission, recommend resuming these services upon acute discharge.    Follow Up Recommendations Home health PT;Supervision - Intermittent(resume current services)    Equipment Recommendations  None recommended by PT    Recommendations for Other Services       Precautions / Restrictions Precautions Precautions: Fall Restrictions Weight Bearing Restrictions: No      Mobility  Bed Mobility Overal bed mobility: Modified Independent             General bed mobility comments: Increased time and effort, HOB raised.  Transfers Overall transfer level: Needs assistance Equipment used: None Transfers: Sit to/from Stand Sit to Stand: Supervision         General transfer comment: Supervision for safety. Increased effort to come to standing.   Ambulation/Gait Ambulation/Gait assistance: Min guard;Supervision Gait Distance (Feet): 180 Feet Assistive device: None Gait Pattern/deviations: Step-through pattern;Decreased stride length Gait velocity: decreased  Gait velocity interpretation: <1.8 ft/sec, indicate of risk for recurrent falls General Gait Details: No  physical assist, Vcs for increased cadence, min guard for safety and stability initially but progressed to supervision with increased distance  Stairs Stairs: Yes Stairs assistance: Supervision Stair Management: Two rails Number of Stairs: 4 General stair comments: no physical assist  Wheelchair Mobility    Modified Rankin (Stroke Patients Only)       Balance Overall balance assessment: Needs assistance Sitting-balance support: No upper extremity supported Sitting balance-Leahy Scale: Normal     Standing balance support: No upper extremity supported Standing balance-Leahy Scale: Fair                 High Level Balance Comments: mild gait unsteadiness with head turns.              Pertinent Vitals/Pain      Home Living Family/patient expects to be discharged to:: Private residence Living Arrangements: Spouse/significant other Available Help at Discharge: Family;Available 24 hours/day(pt's son and daughter assist as frequently) Type of Home: House(Pt reports it is a townhouse ) Home Access: Stairs to enter Entrance Stairs-Rails: None Entrance Stairs-Number of Steps: 1 Home Layout: One level Home Equipment: Cane - single point;Grab bars - toilet;Walker - 2 wheels      Prior Function Level of Independence: Independent with assistive device(s)         Comments: Pt uses cane for community ambulation.  Pt's husband also uses cane. Pt reports she was driving previously this year, but she since decided not to drive due to dizziness episodes. Pt reports falls in association with dizziness, but otherwise pt has only had near falls.      Hand Dominance   Dominant Hand: Right    Extremity/Trunk Assessment        Lower Extremity Assessment Lower Extremity Assessment: Generalized weakness(collapsed arches with callus, decreased sensation )    Cervical /  Trunk Assessment Cervical / Trunk Assessment: Normal  Communication   Communication: HOH  Cognition  Arousal/Alertness: Awake/alert Behavior During Therapy: WFL for tasks assessed/performed Overall Cognitive Status: Within Functional Limits for tasks assessed                                 General Comments: Pt reports cognitive deficits including memory loss and intermittent confusion.       General Comments      Exercises     Assessment/Plan    PT Assessment Patient needs continued PT services  PT Problem List Decreased strength;Decreased activity tolerance;Decreased knowledge of use of DME       PT Treatment Interventions DME instruction;Therapeutic activities;Gait training;Therapeutic exercise;Patient/family education;Balance training;Stair training;Functional mobility training    PT Goals (Current goals can be found in the Care Plan section)  Acute Rehab PT Goals Patient Stated Goal: to go home PT Goal Formulation: With patient Time For Goal Achievement: 04/10/18 Potential to Achieve Goals: Good    Frequency Min 3X/week   Barriers to discharge        Co-evaluation               AM-PAC PT "6 Clicks" Mobility  Outcome Measure Help needed turning from your back to your side while in a flat bed without using bedrails?: A Little Help needed moving from lying on your back to sitting on the side of a flat bed without using bedrails?: A Little Help needed moving to and from a bed to a chair (including a wheelchair)?: None Help needed standing up from a chair using your arms (e.g., wheelchair or bedside chair)?: None Help needed to walk in hospital room?: A Little Help needed climbing 3-5 steps with a railing? : A Little 6 Click Score: 20    End of Session Equipment Utilized During Treatment: Gait belt Activity Tolerance: Patient tolerated treatment well Patient left: in bed;with bed alarm set;with call bell/phone within reach Nurse Communication: Mobility status PT Visit Diagnosis: Other abnormalities of gait and mobility (R26.89)    Time:  1355-1414 PT Time Calculation (min) (ACUTE ONLY): 19 min   Charges:   PT Evaluation $PT Eval Moderate Complexity: 1 Mod          Alben Deeds, PT DPT  Board Certified Neurologic Specialist Acute Rehabilitation Services Pager [phone] Office [phone]   Duncan Dull 03/27/2018, 2:20 PM

## 2018-03-27 NOTE — Progress Notes (Signed)
PROGRESS NOTE    Cynthia [name]  WJX:[id number] DOB: [birth year] DOA: 03/26/2018 PCP: Deland Pretty, MD   Brief Narrative:  77 year old with history of essential hypertension, postural dizziness, paroxysmal atrial fibrillation, hyperlipidemia, obstructive sleep apnea came to the hospital with complaints of dizziness and slurred speech.  Patient has had recurrent urinary tract infections in the past for which he is on daily trimethoprim.  She is also currently on some sort of outpatient study which is followed by April McCaskill.  Upon admission she was found to be slightly dehydrated and also some signs of dehydration.   Assessment & Plan:   Active Problems:   OSA (obstructive sleep apnea)   Essential hypertension, benign   Hyperlipidemia   GERD (gastroesophageal reflux disease)   Hypotension   Postural dizziness with presyncope   PAF (paroxysmal atrial fibrillation) (HCC)  Dizziness and slurred speech Mild to moderate dehydration Urinary tract infection, without hematuria - We will hydrate the patient slowly today.  Orthostatic negative -Echocardiogram in July 2019 showed ejection fraction 65% with normal wall motion, mild pulmonary hypertension -Likely a previous microbiology-we will renally dose Levaquin. -Continue hydration for next 24 hours -Carotid Dopplers-bilateral stenosis 1-39%  Paroxysmal atrial fibrillation -Continue Cardizem.  Also on flecainide 25 mg twice daily -Continue Eliquis  Hyperlipidemia -Continue statin  Obstructive sleep apnea -CPAP as needed  Essential hypertension -Resume home meds-on Cardizem  GERD -PPI  DVT prophylaxis: Eliquis Code Status: Full code Family Communication: None at bedside Disposition Plan: Maintain inpatient stay for further evaluation for dizziness and hydration.  Hopefully we can discharge her in next any 4 hours  Consultants:   None  Procedures:   None  Antimicrobials:   Levaquin   Subjective: States her  dizziness is slightly better than yesterday.  Review of Systems Otherwise negative except as per HPI, including: General: Denies fever, chills, night sweats or unintended weight loss. Resp: Denies cough, wheezing, shortness of breath. Cardiac: Denies chest pain, palpitations, orthopnea, paroxysmal nocturnal dyspnea. GI: Denies abdominal pain, nausea, vomiting, diarrhea or constipation GU: Denies dysuria, frequency, hesitancy or incontinence MS: Denies muscle aches, joint pain or swelling Neuro: Denies headache, neurologic deficits (focal weakness, numbness, tingling), abnormal gait Psych: Denies anxiety, depression, SI/HI/AVH Skin: Denies new rashes or lesions ID: Denies sick contacts, exotic exposures, travel  Objective: Vitals:   03/27/18 0306 03/27/18 0348 03/27/18 0920 03/27/18 1036  BP:  139/85 (!) 145/75   Pulse:  75 76   Resp:  15 16   Temp: 98.3 F (36.8 C) 97.8 F (36.6 C) 97.9 F (36.6 C)   TempSrc:  Oral Oral   SpO2:  96% 95% 99%  Weight:      Height:        Intake/Output Summary (Last 24 hours) at 03/27/2018 1047 Last data filed at 03/27/2018 0254 Gross per 24 hour  Intake 1500 ml  Output -  Net 1500 ml   Filed Weights   03/26/18 2202  Weight: 70 kg    Examination:  General exam: Appears calm and comfortable. DRY Mouth, elderly frail.  Respiratory system: Clear to auscultation. Respiratory effort normal. Cardiovascular system: S1 & S2 heard, RRR. No JVD, murmurs, rubs, gallops or clicks. No pedal edema. Gastrointestinal system: Abdomen is nondistended, soft and nontender. No organomegaly or masses felt. Normal bowel sounds heard. Central nervous system: Alert and oriented. No focal neurological deficits. Extremities: Symmetric 5 x 5 power. Skin: No rashes, lesions or ulcers Psychiatry: Judgement and insight appear normal. Mood & affect appropriate.  Data Reviewed:   CBC: Recent Labs  Lab 03/26/18 2211 03/27/18 0542  WBC 4.9 4.7  NEUTROABS  2.6  --   HGB 10.1* 10.6*  HCT 32.4* 33.0*  MCV 96.1 94.3  PLT 170 209   Basic Metabolic Panel: Recent Labs  Lab 03/26/18 2211 03/27/18 0542  NA 140 143  K 3.8 3.4*  CL 104 107  CO2 27 25  GLUCOSE 127* 109*  BUN 25* 15  CREATININE 0.90 0.64  CALCIUM 8.9 8.6*   GFR: Estimated Creatinine Clearance: 59.7 mL/min (by C-G formula based on SCr of 0.64 mg/dL). Liver Function Tests: No results for input(s): AST, ALT, ALKPHOS, BILITOT, PROT, ALBUMIN in the last 168 hours. No results for input(s): LIPASE, AMYLASE in the last 168 hours. No results for input(s): AMMONIA in the last 168 hours. Coagulation Profile: No results for input(s): INR, PROTIME in the last 168 hours. Cardiac Enzymes: No results for input(s): CKTOTAL, CKMB, CKMBINDEX, TROPONINI in the last 168 hours. BNP (last 3 results) No results for input(s): PROBNP in the last 8760 hours. HbA1C: No results for input(s): HGBA1C in the last 72 hours. CBG: No results for input(s): GLUCAP in the last 168 hours. Lipid Profile: No results for input(s): CHOL, HDL, LDLCALC, TRIG, CHOLHDL, LDLDIRECT in the last 72 hours. Thyroid Function Tests: Recent Labs    03/27/18 0909  TSH 1.230   Anemia Panel: No results for input(s): VITAMINB12, FOLATE, FERRITIN, TIBC, IRON, RETICCTPCT in the last 72 hours. Sepsis Labs: No results for input(s): PROCALCITON, LATICACIDVEN in the last 168 hours.  Recent Results (from the past 240 hour(s))  Urine culture     Status: Abnormal   Collection Time: 03/17/18  5:36 PM  Result Value Ref Range Status   Specimen Description   Final    URINE, CLEAN CATCH Performed at [facility], [place] [address] [place], [place] [address]    Special Requests   Final    Normal Performed at [facility], [place] [address] [place], [place] [address]    Culture (A)  Final    <10,000 COLONIES/mL INSIGNIFICANT GROWTH Performed at [facility] [address],  [place], [place] [address]    Report Status 03/18/2018 FINAL  Final         Radiology Studies: Vas US Carotid  Result Date: 03/27/2018 Carotid Arterial Duplex Study Indications:  Dizziness. Risk Factors: Hypertension. Performing Technologist: Maudry Mayhew MHA, RDMS, RVT, RDCS  Examination Guidelines: A complete evaluation includes B-mode imaging, spectral Doppler, color Doppler, and power Doppler as needed of all accessible portions of each vessel. Bilateral testing is considered an integral part of a complete examination. Limited examinations for reoccurring indications may be performed as noted.  Right Carotid Findings: +----------+--------+--------+--------+--------------------------+--------+           PSV cm/sEDV cm/sStenosisDescribe                  Comments +----------+--------+--------+--------+--------------------------+--------+ CCA Prox  48      12                                                 +----------+--------+--------+--------+--------------------------+--------+ CCA Distal56      13              smooth and heterogenous            +----------+--------+--------+--------+--------------------------+--------+ ICA  Prox  52      16              irregular and heterogenous         +----------+--------+--------+--------+--------------------------+--------+ ICA Distal78      21                                                 +----------+--------+--------+--------+--------------------------+--------+ ECA       42      6                                                  +----------+--------+--------+--------+--------------------------+--------+ +----------+--------+-------+----------------+-------------------+           PSV cm/sEDV cmsDescribe        Arm Pressure (mmHG) +----------+--------+-------+----------------+-------------------+ [id number]            Multiphasic, WNL                     +----------+--------+-------+----------------+-------------------+ +---------+--------+--+--------+--+---------+ VertebralPSV cm/s63EDV cm/s14Antegrade +---------+--------+--+--------+--+---------+  Left Carotid Findings: +----------+--------+--------+--------+--------------------------+--------+           PSV cm/sEDV cm/sStenosisDescribe                  Comments +----------+--------+--------+--------+--------------------------+--------+ CCA Prox  58      12                                                 +----------+--------+--------+--------+--------------------------+--------+ CCA Distal63      18                                                 +----------+--------+--------+--------+--------------------------+--------+ ICA Prox  51      15              irregular and heterogenous         +----------+--------+--------+--------+--------------------------+--------+ ICA Distal60      20                                                 +----------+--------+--------+--------+--------------------------+--------+ ECA       58      12              heterogenous                       +----------+--------+--------+--------+--------------------------+--------+ +----------+--------+--------+----------------+-------------------+ SubclavianPSV cm/sEDV cm/sDescribe        Arm Pressure (mmHG) +----------+--------+--------+----------------+-------------------+           137             Multiphasic, WNL                    +----------+--------+--------+----------------+-------------------+ +---------+--------+--+--------+--+---------+ VertebralPSV cm/s47EDV cm/s12Antegrade +---------+--------+--+--------+--+---------+  Summary: Right Carotid: Velocities in the right ICA are consistent with a 1-39% stenosis. Left Carotid: Velocities in  the left ICA are consistent with a 1-39% stenosis. Vertebrals:  Bilateral vertebral arteries demonstrate antegrade flow. Subclavians:  Normal flow hemodynamics were seen in bilateral subclavian              arteries. *See table(s) above for measurements and observations.     Preliminary         Scheduled Meds: . apixaban  5 mg Oral BID  . buPROPion  150 mg Oral Daily  . diltiazem  120 mg Oral QPM  . DULoxetine  30 mg Oral BID  . flecainide  25 mg Oral BID  . levofloxacin  500 mg Oral Daily  . losartan  50 mg Oral Daily  . pantoprazole  40 mg Oral QAC breakfast  . thyroid  60 mg Oral QAC breakfast   Continuous Infusions: . sodium chloride 75 mL/hr at 03/27/18 1008     LOS: 0 days   Time spent= 35 mins     Arsenio Loader, MD [facility] Pager [phone]   If 7PM-7AM, please contact night-coverage [email or web address] Password [facility] 03/27/2018, 10:47 AM

## 2018-03-27 NOTE — Progress Notes (Signed)
Patient refuses CPAP for tonight.  

## 2018-03-27 NOTE — ED Notes (Signed)
Bladder scan revealed >471ml. Pt states is not in any discomfort.

## 2018-03-27 NOTE — Progress Notes (Signed)
Carotid artery duplex completed.  Refer to "CV Proc" under chart review to view preliminary results.  03/27/2018 9:50 AM Maudry Mayhew, MHA, RVT, RDCS, RDMS

## 2018-03-28 DIAGNOSIS — R55 Syncope and collapse: Secondary | ICD-10-CM | POA: Diagnosis not present

## 2018-03-28 DIAGNOSIS — R42 Dizziness and giddiness: Secondary | ICD-10-CM | POA: Diagnosis not present

## 2018-03-28 DIAGNOSIS — I480 Paroxysmal atrial fibrillation: Secondary | ICD-10-CM

## 2018-03-28 DIAGNOSIS — I10 Essential (primary) hypertension: Secondary | ICD-10-CM | POA: Diagnosis not present

## 2018-03-28 DIAGNOSIS — E860 Dehydration: Secondary | ICD-10-CM | POA: Diagnosis not present

## 2018-03-28 DIAGNOSIS — E7800 Pure hypercholesterolemia, unspecified: Secondary | ICD-10-CM | POA: Diagnosis not present

## 2018-03-28 DIAGNOSIS — K219 Gastro-esophageal reflux disease without esophagitis: Secondary | ICD-10-CM | POA: Diagnosis not present

## 2018-03-28 LAB — COMPREHENSIVE METABOLIC PANEL
ALT: 21 U/L (ref 0–44)
ANION GAP: 10 (ref 5–15)
AST: 22 U/L (ref 15–41)
Albumin: 3.3 g/dL — ABNORMAL LOW (ref 3.5–5.0)
Alkaline Phosphatase: 46 U/L (ref 38–126)
BUN: 11 mg/dL (ref 8–23)
CO2: 24 mmol/L (ref 22–32)
Calcium: 8.8 mg/dL — ABNORMAL LOW (ref 8.9–10.3)
Chloride: 107 mmol/L (ref 98–111)
Creatinine, Ser: 0.63 mg/dL (ref 0.44–1.00)
GFR calc Af Amer: >60 mL/min (ref >60)
GFR calc non Af Amer: >60 mL/min (ref >60)
Glucose, Bld: 104 mg/dL — ABNORMAL HIGH (ref 70–99)
POTASSIUM: 3.6 mmol/L (ref 3.5–5.1)
Sodium: 141 mmol/L (ref 135–145)
Total Bilirubin: 0.5 mg/dL (ref 0.3–1.2)
Total Protein: 5.9 g/dL — ABNORMAL LOW (ref 6.5–8.1)

## 2018-03-28 LAB — MAGNESIUM: Magnesium: 1.8 mg/dL (ref 1.7–2.4)

## 2018-03-28 MED ORDER — LEVOFLOXACIN 500 MG PO TABS: 500.0000 mg | ORAL_TABLET | Freq: Every day | ORAL | 0 refills | Status: AC

## 2018-03-28 NOTE — Progress Notes (Signed)
Discharge instructions (including medications) discussed with and copy provided to patient/caregiver 

## 2018-03-28 NOTE — Discharge Summary (Signed)
Physician Discharge Summary  Cynthia [name] MPN:[id number] DOB: [birth year] DOA: 03/26/2018  PCP: Deland Pretty, MD  Admit date: 03/26/2018 Discharge date: 03/28/2018  Admitted From: Home Disposition: Home  Recommendations for Outpatient Follow-up:  1. Follow up with PCP in 1-2 weeks 2. Please obtain BMP/CBC in one week your next doctors visit.  3. 3 days of oral Levaquin 4. Advised to continue hydrate herself orally. 5. Follow-up outpatient with cardiology, Dr. Einar Gip  Discharge Condition: Stable CODE STATUS: Full Diet recommendation: Cardiac  Brief/Interim Summary: 77 year old with history of essential hypertension, postural dizziness, paroxysmal atrial fibrillation, hyperlipidemia, obstructive sleep apnea came to the hospital with complaints of dizziness and slurred speech.  Patient has had recurrent urinary tract infections in the past for which he is on daily trimethoprim.  She is also currently on some sort of outpatient study which is followed by April McCaskill.  Upon admission she was found to be slightly dehydrated and also some signs of dehydration which improved the following day.  Her previous cultures were reviewed as well therefore she was started on renally dosed Levaquin for a total of 5 days. The following day she symptomatically felt much better.  Orthostatic signs were negative.  She was evaluated PT/OT who recommended home health therefore arrangements were made.  Today she is reached maximal benefit from hospital stay and stable for discharge.   Discharge Diagnoses:  Active Problems:   OSA (obstructive sleep apnea)   Essential hypertension, benign   Hyperlipidemia   GERD (gastroesophageal reflux disease)   Hypotension   Postural dizziness with presyncope   PAF (paroxysmal atrial fibrillation) (HCC)  Dizziness and slurred speech Mild to moderate dehydration Urinary tract infection, without hematuria -Her symptoms have resolved, orthostatic vital signs are  negative. -Echocardiogram in July 2019 showed ejection fraction 65% with normal wall motion, mild pulmonary hypertension -Likely a previous microbiology-we will renally dose Levaquin for 4 more days. -Continue hydration for next 24 hours -Carotid Dopplers-bilateral stenosis 1-39%  Paroxysmal atrial fibrillation -Continue Cardizem.  Also on flecainide 25 mg twice daily -Continue Eliquis.  Follow-up outpatient with Dr. Einar Gip.  Hyperlipidemia -Continue statin  Obstructive sleep apnea -CPAP as needed  Essential hypertension -Resume home meds-on Cardizem  GERD -PPI  On Eliquis Full code Discharge today.  Discharge Instructions   Allergies as of 03/28/2018   No Known Allergies     Medication List    STOP taking these medications   acetaminophen 500 MG tablet Commonly known as:  TYLENOL   cephALEXin 500 MG capsule Commonly known as:  KEFLEX     TAKE these medications   atorvastatin 10 MG tablet Commonly known as:  LIPITOR TAKE 1 TABLET BY MOUTH EVERY DAY AT 6PM *NEED OFFICE VISIT* What changed:  See the new instructions.   buPROPion 150 MG 24 hr tablet Commonly known as:  WELLBUTRIN XL Take 150 mg by mouth daily.   CALCIUM PO Take 600 mg by mouth 2 (two) times daily.   cetirizine 10 MG tablet Commonly known as:  ZYRTEC Take 10 mg by mouth daily.   cholecalciferol 1000 units tablet Commonly known as:  VITAMIN D Take 1,000 Units by mouth daily.   CRANBERRY CONCENTRATE PO Take 650 mg by mouth daily.   cycloSPORINE 0.05 % ophthalmic emulsion Commonly known as:  RESTASIS Place 1 drop into both eyes 2 (two) times daily.   diltiazem 120 MG 24 hr capsule Commonly known as:  CARDIZEM CD TAKE 1 CAPSULE BY MOUTH EVERY DAY *NEED OFFICE VISIT* What changed:  See the new instructions.   DULoxetine 30 MG capsule Commonly known as:  CYMBALTA TAKE 1 CAPSULE (30 MG TOTAL) BY MOUTH 2 (TWO) TIMES DAILY.   ELIQUIS 5 MG Tabs tablet Generic drug:   apixaban Take 5 mg by mouth 2 (two) times daily.   flecainide 50 MG tablet Commonly known as:  TAMBOCOR Take 0.5 tablets (25 mg total) by mouth 2 (two) times daily.   GLUCOSAMINE-CHONDROITIN PO Take 2 tablets by mouth daily with lunch. Glucosamine 1500mg  Chondroitin 1200mg    levofloxacin 500 MG tablet Commonly known as:  LEVAQUIN Take 1 tablet (500 mg total) by mouth daily for 4 days.   LIPOIC ACID PO Take 600 mg by mouth 2 (two) times daily.   Alpha-Lipoic Acid 600 MG Caps Take 1,200 mg by mouth daily.   losartan 50 MG tablet Commonly known as:  COZAAR Take 1 tablet (50 mg total) by mouth daily.   Lutein-Zeaxanthin 25-5 MG Caps Take 1 tablet by mouth daily.   Melatonin 5 MG Tabs Take 5 mg by mouth at bedtime.   multivitamin with minerals Tabs tablet Take 1 tablet by mouth daily.   nitroGLYCERIN 0.4 MG/SPRAY spray Commonly known as:  NITROLINGUAL Place 1 spray under the tongue every 5 (five) minutes x 3 doses as needed for chest pain.   Omega-3 1000 MG Caps Take 1,000 mg by mouth daily.   pantoprazole 40 MG tablet Commonly known as:  PROTONIX Take 40 mg by mouth daily before breakfast.   ranitidine 150 MG tablet Commonly known as:  ZANTAC Take 1 tablet (150 mg total) by mouth 2 (two) times daily.   simethicone 80 MG chewable tablet Commonly known as:  MYLICON Chew 80 mg by mouth every 6 (six) hours as needed for flatulence.   thyroid 60 MG tablet Commonly known as:  ARMOUR Take 60 mg by mouth daily before breakfast.   tiZANidine 4 MG tablet Commonly known as:  ZANAFLEX TAKE 1 TABLET BY MOUTH THREE TIMES A DAY AS NEEDED What changed:    how much to take  when to take this  reasons to take this   trimethoprim 100 MG tablet Commonly known as:  TRIMPEX Take 100 mg by mouth daily.   vitamin C 500 MG tablet Commonly known as:  ASCORBIC ACID Take 1,000 mg by mouth daily.      Follow-up Information    Deland Pretty, MD. Schedule an appointment as  soon as possible for a visit in 1 week(s).   Specialty:  Internal Medicine Contact information: [address] [place] [address] [phone]        Adrian Prows, MD. Schedule an appointment as soon as possible for a visit.   Specialty:  Cardiology Why:  Needs Event monitor.  Contact information: [place] [address] [phone]        [facility] - Dme Follow up.   Why:  They will contact you for the first appointment Contact information: [address] [place] [address] [phone]          No Known Allergies  You were cared for by a hospitalist during your hospital stay. If you have any questions about your discharge medications or the care you received while you were in the hospital after you are discharged, you can call the unit and asked to speak with the hospitalist on call if the hospitalist that took care of you is not available. Once you are discharged, your primary  care physician will handle any further medical issues. Please note that no refills for any discharge medications will be authorized once you are discharged, as it is imperative that you return to your primary care physician (or establish a relationship with a primary care physician if you do not have one) for your aftercare needs so that they can reassess your need for medications and monitor your lab values.  Consultations:  None   Procedures/Studies: Dg Chest 2 View  Result Date: 03/17/2018 CLINICAL DATA:  Recurrent UTIs, chest pain, shortness of breath, near syncopal episode EXAM: CHEST - 2 VIEW COMPARISON:  03/16/2018 FINDINGS: Normal heart size and vascularity. Minor basilar atelectasis. No focal pneumonia, collapse or consolidation. Negative for edema, significant effusion or pneumothorax. Trachea midline. Aorta atherosclerotic. Degenerative changes noted of the spine with a mild scoliosis. IMPRESSION: Basilar atelectasis. Thoracic  atherosclerosis No definite acute chest process Electronically Signed   By: Jerilynn Mages.  Shick M.D.   On: 03/17/2018 17:19   Dg Chest 2 View  Result Date: 03/16/2018 CLINICAL DATA:  77 year old female with a history of syncope EXAM: CHEST - 2 VIEW COMPARISON:  11/09/2017 FINDINGS: Cardiomediastinal silhouette unchanged in size and contour. No evidence of central vascular congestion. Low lung volumes with coarsened interstitial markings. Stigmata of emphysema, with increased retrosternal airspace, flattened hemidiaphragms, increased AP diameter, and hyperinflation on the AP view. No pleural effusion or pneumothorax. No confluent airspace disease. Vascular calcifications. No displaced fracture IMPRESSION: Chronic lung changes and evidence of emphysema without evidence of acute cardiopulmonary disease. Electronically Signed   By: Corrie Mckusick D.O.   On: 03/16/2018 15:06   Vas US Carotid  Result Date: 03/27/2018 Carotid Arterial Duplex Study Indications:  Dizziness. Risk Factors: Hypertension. Performing Technologist: Maudry Mayhew MHA, RDMS, RVT, RDCS  Examination Guidelines: A complete evaluation includes B-mode imaging, spectral Doppler, color Doppler, and power Doppler as needed of all accessible portions of each vessel. Bilateral testing is considered an integral part of a complete examination. Limited examinations for reoccurring indications may be performed as noted.  Right Carotid Findings: +----------+--------+--------+--------+--------------------------+--------+           PSV cm/sEDV cm/sStenosisDescribe                  Comments +----------+--------+--------+--------+--------------------------+--------+ CCA Prox  48      12                                                 +----------+--------+--------+--------+--------------------------+--------+ CCA Distal56      13              smooth and heterogenous             +----------+--------+--------+--------+--------------------------+--------+ ICA Prox  52      16              irregular and heterogenous         +----------+--------+--------+--------+--------------------------+--------+ ICA Distal78      21                                                 +----------+--------+--------+--------+--------------------------+--------+ ECA       42      6                                                  +----------+--------+--------+--------+--------------------------+--------+ +----------+--------+-------+----------------+-------------------+  PSV cm/sEDV cmsDescribe        Arm Pressure (mmHG) +----------+--------+-------+----------------+-------------------+ [id number]            Multiphasic, WNL                    +----------+--------+-------+----------------+-------------------+ +---------+--------+--+--------+--+---------+ VertebralPSV cm/s63EDV cm/s14Antegrade +---------+--------+--+--------+--+---------+  Left Carotid Findings: +----------+--------+--------+--------+--------------------------+--------+           PSV cm/sEDV cm/sStenosisDescribe                  Comments +----------+--------+--------+--------+--------------------------+--------+ CCA Prox  58      12                                                 +----------+--------+--------+--------+--------------------------+--------+ CCA Distal63      18                                                 +----------+--------+--------+--------+--------------------------+--------+ ICA Prox  51      15              irregular and heterogenous         +----------+--------+--------+--------+--------------------------+--------+ ICA Distal60      20                                                 +----------+--------+--------+--------+--------------------------+--------+ ECA       58      12              heterogenous                        +----------+--------+--------+--------+--------------------------+--------+ +----------+--------+--------+----------------+-------------------+ SubclavianPSV cm/sEDV cm/sDescribe        Arm Pressure (mmHG) +----------+--------+--------+----------------+-------------------+           137             Multiphasic, WNL                    +----------+--------+--------+----------------+-------------------+ +---------+--------+--+--------+--+---------+ VertebralPSV cm/s47EDV cm/s12Antegrade +---------+--------+--+--------+--+---------+  Summary: Right Carotid: Velocities in the right ICA are consistent with a 1-39% stenosis. Left Carotid: Velocities in the left ICA are consistent with a 1-39% stenosis. Vertebrals:  Bilateral vertebral arteries demonstrate antegrade flow. Subclavians: Normal flow hemodynamics were seen in bilateral subclavian              arteries. *See table(s) above for measurements and observations.  Electronically signed by Deitra Mayo MD on 03/27/2018 at 1:52:17 PM.    Final       Subjective: Feels better therefore wishes to go home.  No other acute events overnight.  General = no fevers, chills, dizziness, malaise, fatigue HEENT/EYES = negative for pain, redness, loss of vision, double vision, blurred vision, loss of hearing, sore throat, hoarseness, dysphagia Cardiovascular= negative for chest pain, palpitation, murmurs, lower extremity swelling Respiratory/lungs= negative for shortness of breath, cough, hemoptysis, wheezing, mucus production Gastrointestinal= negative for nausea, vomiting,, abdominal pain, melena, hematemesis Genitourinary= negative for Dysuria, Hematuria, Change in Urinary Frequency MSK = Negative for arthralgia, myalgias, Back Pain, Joint swelling  Neurology= Negative for headache,  seizures, numbness, tingling  Psychiatry= Negative for anxiety, depression, suicidal and homocidal ideation Allergy/Immunology= Medication/Food allergy as  listed  Skin= Negative for Rash, lesions, ulcers, itching    Discharge Exam: Vitals:   03/28/18 0310 03/28/18 0726  BP: (!) 158/83 (!) 152/84  Pulse: 76 66  Resp: 17 18  Temp: 98.2 F (36.8 C) 98.8 F (37.1 C)  SpO2: 96% 94%   Vitals:   03/27/18 2310 03/28/18 0045 03/28/18 0310 03/28/18 0726  BP: (!) 171/89 (!) 153/72 (!) 158/83 (!) 152/84  Pulse:  70 76 66  Resp:   17 18  Temp:   98.2 F (36.8 C) 98.8 F (37.1 C)  TempSrc:   Oral Oral  SpO2:   96% 94%  Weight:      Height:        General: Pt is alert, awake, not in acute distress Cardiovascular: RRR, S1/S2 +, no rubs, no gallops Respiratory: CTA bilaterally, no wheezing, no rhonchi Abdominal: Soft, NT, ND, bowel sounds + Extremities: no edema, no cyanosis    The results of significant diagnostics from this hospitalization (including imaging, microbiology, ancillary and laboratory) are listed below for reference.     Microbiology: No results found for this or any previous visit (from the past 240 hour(s)).   Labs: BNP (last 3 results) Recent Labs    03/17/18 1613  BNP 01.7   Basic Metabolic Panel: Recent Labs  Lab 03/26/18 2211 03/27/18 0542 03/28/18 0352  NA 140 143 141  K 3.8 3.4* 3.6  CL 104 107 107  CO2 27 25 24   GLUCOSE 127* 109* 104*  BUN 25* 15 11  CREATININE 0.90 0.64 0.63  CALCIUM 8.9 8.6* 8.8*  MG  --   --  1.8   Liver Function Tests: Recent Labs  Lab 03/28/18 0352  AST 22  ALT 21  ALKPHOS 46  BILITOT 0.5  PROT 5.9*  ALBUMIN 3.3*   No results for input(s): LIPASE, AMYLASE in the last 168 hours. No results for input(s): AMMONIA in the last 168 hours. CBC: Recent Labs  Lab 03/26/18 2211 03/27/18 0542  WBC 4.9 4.7  NEUTROABS 2.6  --   HGB 10.1* 10.6*  HCT 32.4* 33.0*  MCV 96.1 94.3  PLT 170 166   Cardiac Enzymes: No results for input(s): CKTOTAL, CKMB, CKMBINDEX, TROPONINI in the last 168 hours. BNP: Invalid input(s): POCBNP CBG: No results for input(s): GLUCAP in  the last 168 hours. D-Dimer No results for input(s): DDIMER in the last 72 hours. Hgb A1c No results for input(s): HGBA1C in the last 72 hours. Lipid Profile No results for input(s): CHOL, HDL, LDLCALC, TRIG, CHOLHDL, LDLDIRECT in the last 72 hours. Thyroid function studies Recent Labs    03/27/18 0909  TSH 1.230   Anemia work up No results for input(s): VITAMINB12, FOLATE, FERRITIN, TIBC, IRON, RETICCTPCT in the last 72 hours. Urinalysis    Component Value Date/Time   COLORURINE YELLOW 03/27/2018 0132   APPEARANCEUR CLEAR 03/27/2018 0132   LABSPEC 1.015 03/27/2018 0132   PHURINE 6.0 03/27/2018 0132   GLUCOSEU NEGATIVE 03/27/2018 0132   HGBUR NEGATIVE 03/27/2018 0132   BILIRUBINUR NEGATIVE 03/27/2018 0132   KETONESUR NEGATIVE 03/27/2018 0132   PROTEINUR NEGATIVE 03/27/2018 0132   NITRITE NEGATIVE 03/27/2018 0132   LEUKOCYTESUR MODERATE (A) 03/27/2018 0132   Sepsis Labs Invalid input(s): PROCALCITONIN,  WBC,  LACTICIDVEN Microbiology No results found for this or any previous visit (from the past 240 hour(s)).   Time coordinating discharge:  I have  spent 35 minutes face to face with the patient and on the ward discussing the patients care, assessment, plan and disposition with other care givers. >50% of the time was devoted counseling the patient about the risks and benefits of treatment/Discharge disposition and coordinating care.   SIGNED:   Damita Lack, MD  [facility] 03/28/2018, 1:27 PM Pager   If 7PM-7AM, please contact night-coverage [email or web address] Password [email or web address]

## 2018-03-28 NOTE — Care Management Note (Signed)
Case Management Note  Patient Details  Name: Cynthia [name] MRN: [id number] Date of Birth: August 09, 1940  Subjective/Objective:   Pt admitted with postural dizziness and presyncope. She is from home with her spouse.  DME: cane, walker Pt denies issues obtaining her medications.  Pt denies issues with transportation.                 Action/Plan: Pt discharging home with orders for [facility] services. CM provided her choice. She is already set up with [facility] prior to admission. Pt would like to continue with their services. Butch Penny with [facility] notified of pt being in hospital and resumption of services.  Pts spouse to provide transport home.   Expected Discharge Date:  03/28/18               Expected Discharge Plan:  [place]  In-House Referral:     Discharge planning Services  CM Consult  Post Acute Care Choice:  Home Health, Resumption of Svcs/PTA Provider Choice offered to:  Patient  DME Arranged:    DME Agency:     HH Arranged:  RN, PT, OT HH Agency:  [facility]  Status of Service:  Completed, signed off  If discussed at [place] of Stay Meetings, dates discussed:    Additional Comments:  Pollie Friar, RN 03/28/2018, 11:31 AM

## 2018-03-28 NOTE — Progress Notes (Signed)
Occupational Therapy Evaluation Patient Details Name: Cynthia [name] MRN: [id number] DOB: [birth year] Today's Date: 03/28/2018    History of Present Illness 77 year old Caucasian female with paroxysmal atrial fibrillation, normal coronary arteries by cardiac catheterization 2018, obstructive sleep apnea on CPAP, hypertension, hyperlipidemia, chronic fatigue and memory loss admitted to the hospital with near syncope, dizziness and slurred speech. History of recent and recurring UTI.  Recently discharged with Home health services.   Clinical Impression   Pt has had several recent admissions to hospital. Pt mobilizing close to baseline however demonstrates cognitive deficits and will benefit from [facility] to maximize independence with ADL and IADL tasks. Recommend HHRN to follow medication management and po intact given recent issue with dehydration. Pt states that her husband assist with her medication management, but that he is "slipping" too. Pt states she feels more "tired" than usual and asking if it is OK to sleep during the day. Pt denies symptoms of depression. Encouraged pt to discuss her concerns regarding fatigue and water intake with her MD. Recommend pt hire a home care agency to help with home management tasks. Pt verbalized understanding.     Follow Up Recommendations  Home health OT;Supervision/Assistance - 24 hour    Equipment Recommendations  None recommended by OT    Recommendations for Other Services       Precautions / Restrictions Precautions Precautions: Fall      Mobility Bed Mobility Overal bed mobility: Modified Independent                Transfers Overall transfer level: Needs assistance Equipment used: Rolling walker (2 wheeled)   Sit to Stand: Supervision         General transfer comment: cues in proper use of RW; pt leaving RW at sink    Balance             Standing balance-Leahy Scale: Fair                             ADL  either performed or assessed with clinical judgement   ADL Overall ADL's : Needs assistance/impaired                                     Functional mobility during ADLs: Supervision/safety;Rolling walker;Cueing for safety General ADL Comments: Overall S with ADL; Educated pt on safety during ADL and need to sit as much as possible; pt verbalized understanding. Recommend pt use a shower chair fro bathing. HHOT can further assess.      Vision Baseline Vision/History: Wears glasses Patient Visual Report: No change from baseline       Perception     Praxis      Pertinent Vitals/Pain Pain Assessment: No/denies pain     Hand Dominance Right   Extremity/Trunk Assessment Upper Extremity Assessment Upper Extremity Assessment: Generalized weakness   Lower Extremity Assessment Lower Extremity Assessment: Defer to PT evaluation   Cervical / Trunk Assessment Cervical / Trunk Assessment: Normal   Communication Communication Communication: HOH   Cognition Arousal/Alertness: Awake/alert Behavior During Therapy: WFL for tasks assessed/performed Overall Cognitive Status: History of cognitive impairments - at baseline                                 General Comments: pt reports increased memory loss; difficulty with  alternating attention and easliy distracted; cues to use walker rather than leaving RW at sink; poor safety awareness regarding use of RW   General Comments  Pt reports her husband does her medication management but that he is "slipping" too. Recommend pt hire a home care agency to help with errands, home management tasks and S of medicaiton.    Exercises     Shoulder Instructions      Home Living Family/patient expects to be discharged to:: Private residence Living Arrangements: Spouse/significant other Available Help at Discharge: Family;Available 24 hours/day Type of Home: House Home Access: Stairs to enter [employer] of  Steps: 1 Entrance Stairs-Rails: None Home Layout: One level     Bathroom Shower/Tub: Teacher, early years/pre: Handicapped height Bathroom Accessibility: Yes How Accessible: Accessible via walker Home Equipment: Cane - single point;Grab bars - toilet;Walker - 2 wheels;Tub bench          Prior Functioning/Environment Level of Independence: Independent with assistive device(s)        Comments: Pt uses cane for community ambulation.  Pt's husband also uses cane. Pt reports she was driving previously this year, but she since decided not to drive due to dizziness episodes. Pt reports falls in association with dizziness, but otherwise pt has only had near falls. Pt has recently stopped driving within the past month.        OT Problem List: Decreased activity tolerance;Impaired balance (sitting and/or standing);Decreased cognition;Decreased safety awareness;Decreased knowledge of use of DME or AE      OT Treatment/Interventions:      OT Goals(Current goals can be found in the care plan section) Acute Rehab OT Goals Patient Stated Goal: to go home OT Goal Formulation: All assessment and education complete, DC therapy  OT Frequency:     Barriers to D/C:            Co-evaluation              AM-PAC OT "6 Clicks" Daily Activity     Outcome Measure Help from another person eating meals?: None Help from another person taking care of personal grooming?: None Help from another person toileting, which includes using toliet, bedpan, or urinal?: None Help from another person bathing (including washing, rinsing, drying)?: None Help from another person to put on and taking off regular upper body clothing?: None Help from another person to put on and taking off regular lower body clothing?: None 6 Click Score: 24   End of Session Nurse Communication: Other (comment)(DC needs)  Activity Tolerance: Patient tolerated treatment well Patient left: in bed;with call  bell/phone within reach;with bed alarm set  OT Visit Diagnosis: Unsteadiness on feet (R26.81);Muscle weakness (generalized) (M62.81);Other symptoms and signs involving cognitive function;Dizziness and giddiness (R42)                Time: 2563-8937 OT Time Calculation (min): 35 min Charges:  OT General Charges $OT Visit: 1 Visit OT Evaluation $OT Eval Low Complexity: 1 Low OT Treatments $Self Care/Home Management : 8-22 mins  Maurie Boettcher, OT/L   Acute OT Clinical Specialist Acute Rehabilitation Services Pager [phone] Office [phone]   [facility] 03/28/2018, 10:07 AM

## 2018-03-28 NOTE — Care Management Obs Status (Signed)
[place] NOTIFICATION   Patient Details  Name: Cynthia [name] MRN: [id number] Date of Birth: Jun 01, 1940   Medicare Observation Status Notification Given:  Yes    Pollie Friar, RN 03/28/2018, 10:48 AM

## 2018-03-29 DIAGNOSIS — R55 Syncope and collapse: Secondary | ICD-10-CM | POA: Diagnosis not present

## 2018-03-30 DIAGNOSIS — K219 Gastro-esophageal reflux disease without esophagitis: Secondary | ICD-10-CM | POA: Diagnosis not present

## 2018-03-30 DIAGNOSIS — I480 Paroxysmal atrial fibrillation: Secondary | ICD-10-CM | POA: Diagnosis not present

## 2018-03-30 DIAGNOSIS — E0590 Thyrotoxicosis, unspecified without thyrotoxic crisis or storm: Secondary | ICD-10-CM | POA: Diagnosis not present

## 2018-03-30 DIAGNOSIS — N390 Urinary tract infection, site not specified: Secondary | ICD-10-CM | POA: Diagnosis not present

## 2018-03-30 DIAGNOSIS — D649 Anemia, unspecified: Secondary | ICD-10-CM | POA: Diagnosis not present

## 2018-03-30 DIAGNOSIS — G4733 Obstructive sleep apnea (adult) (pediatric): Secondary | ICD-10-CM | POA: Diagnosis not present

## 2018-03-30 DIAGNOSIS — G629 Polyneuropathy, unspecified: Secondary | ICD-10-CM | POA: Diagnosis not present

## 2018-03-30 DIAGNOSIS — I951 Orthostatic hypotension: Secondary | ICD-10-CM | POA: Diagnosis not present

## 2018-03-30 DIAGNOSIS — M1990 Unspecified osteoarthritis, unspecified site: Secondary | ICD-10-CM | POA: Diagnosis not present

## 2018-03-30 DIAGNOSIS — F419 Anxiety disorder, unspecified: Secondary | ICD-10-CM | POA: Diagnosis not present

## 2018-03-30 DIAGNOSIS — I10 Essential (primary) hypertension: Secondary | ICD-10-CM | POA: Diagnosis not present

## 2018-03-31 DIAGNOSIS — M1990 Unspecified osteoarthritis, unspecified site: Secondary | ICD-10-CM | POA: Diagnosis not present

## 2018-03-31 DIAGNOSIS — D649 Anemia, unspecified: Secondary | ICD-10-CM | POA: Diagnosis not present

## 2018-03-31 DIAGNOSIS — G629 Polyneuropathy, unspecified: Secondary | ICD-10-CM | POA: Diagnosis not present

## 2018-03-31 DIAGNOSIS — I951 Orthostatic hypotension: Secondary | ICD-10-CM | POA: Diagnosis not present

## 2018-03-31 DIAGNOSIS — G4733 Obstructive sleep apnea (adult) (pediatric): Secondary | ICD-10-CM | POA: Diagnosis not present

## 2018-03-31 DIAGNOSIS — I10 Essential (primary) hypertension: Secondary | ICD-10-CM | POA: Diagnosis not present

## 2018-03-31 DIAGNOSIS — K219 Gastro-esophageal reflux disease without esophagitis: Secondary | ICD-10-CM | POA: Diagnosis not present

## 2018-03-31 DIAGNOSIS — I480 Paroxysmal atrial fibrillation: Secondary | ICD-10-CM | POA: Diagnosis not present

## 2018-03-31 DIAGNOSIS — E0590 Thyrotoxicosis, unspecified without thyrotoxic crisis or storm: Secondary | ICD-10-CM | POA: Diagnosis not present

## 2018-04-01 DIAGNOSIS — G629 Polyneuropathy, unspecified: Secondary | ICD-10-CM | POA: Diagnosis not present

## 2018-04-01 DIAGNOSIS — D649 Anemia, unspecified: Secondary | ICD-10-CM | POA: Diagnosis not present

## 2018-04-01 DIAGNOSIS — I10 Essential (primary) hypertension: Secondary | ICD-10-CM | POA: Diagnosis not present

## 2018-04-01 DIAGNOSIS — E0590 Thyrotoxicosis, unspecified without thyrotoxic crisis or storm: Secondary | ICD-10-CM | POA: Diagnosis not present

## 2018-04-01 DIAGNOSIS — G4733 Obstructive sleep apnea (adult) (pediatric): Secondary | ICD-10-CM | POA: Diagnosis not present

## 2018-04-01 DIAGNOSIS — M1990 Unspecified osteoarthritis, unspecified site: Secondary | ICD-10-CM | POA: Diagnosis not present

## 2018-04-01 DIAGNOSIS — I951 Orthostatic hypotension: Secondary | ICD-10-CM | POA: Diagnosis not present

## 2018-04-01 DIAGNOSIS — K219 Gastro-esophageal reflux disease without esophagitis: Secondary | ICD-10-CM | POA: Diagnosis not present

## 2018-04-01 DIAGNOSIS — I480 Paroxysmal atrial fibrillation: Secondary | ICD-10-CM | POA: Diagnosis not present

## 2018-04-04 DIAGNOSIS — D649 Anemia, unspecified: Secondary | ICD-10-CM | POA: Diagnosis not present

## 2018-04-04 DIAGNOSIS — I480 Paroxysmal atrial fibrillation: Secondary | ICD-10-CM | POA: Diagnosis not present

## 2018-04-04 DIAGNOSIS — I10 Essential (primary) hypertension: Secondary | ICD-10-CM | POA: Diagnosis not present

## 2018-04-04 DIAGNOSIS — K219 Gastro-esophageal reflux disease without esophagitis: Secondary | ICD-10-CM | POA: Diagnosis not present

## 2018-04-04 DIAGNOSIS — G629 Polyneuropathy, unspecified: Secondary | ICD-10-CM | POA: Diagnosis not present

## 2018-04-04 DIAGNOSIS — I951 Orthostatic hypotension: Secondary | ICD-10-CM | POA: Diagnosis not present

## 2018-04-04 DIAGNOSIS — M1990 Unspecified osteoarthritis, unspecified site: Secondary | ICD-10-CM | POA: Diagnosis not present

## 2018-04-04 DIAGNOSIS — E0590 Thyrotoxicosis, unspecified without thyrotoxic crisis or storm: Secondary | ICD-10-CM | POA: Diagnosis not present

## 2018-04-04 DIAGNOSIS — G4733 Obstructive sleep apnea (adult) (pediatric): Secondary | ICD-10-CM | POA: Diagnosis not present

## 2018-04-07 DIAGNOSIS — K219 Gastro-esophageal reflux disease without esophagitis: Secondary | ICD-10-CM | POA: Diagnosis not present

## 2018-04-07 DIAGNOSIS — D649 Anemia, unspecified: Secondary | ICD-10-CM | POA: Diagnosis not present

## 2018-04-07 DIAGNOSIS — E0590 Thyrotoxicosis, unspecified without thyrotoxic crisis or storm: Secondary | ICD-10-CM | POA: Diagnosis not present

## 2018-04-07 DIAGNOSIS — I480 Paroxysmal atrial fibrillation: Secondary | ICD-10-CM | POA: Diagnosis not present

## 2018-04-07 DIAGNOSIS — G4733 Obstructive sleep apnea (adult) (pediatric): Secondary | ICD-10-CM | POA: Diagnosis not present

## 2018-04-07 DIAGNOSIS — I951 Orthostatic hypotension: Secondary | ICD-10-CM | POA: Diagnosis not present

## 2018-04-07 DIAGNOSIS — G629 Polyneuropathy, unspecified: Secondary | ICD-10-CM | POA: Diagnosis not present

## 2018-04-07 DIAGNOSIS — M1990 Unspecified osteoarthritis, unspecified site: Secondary | ICD-10-CM | POA: Diagnosis not present

## 2018-04-07 DIAGNOSIS — I10 Essential (primary) hypertension: Secondary | ICD-10-CM | POA: Diagnosis not present

## 2018-04-08 DIAGNOSIS — N390 Urinary tract infection, site not specified: Secondary | ICD-10-CM | POA: Diagnosis not present

## 2018-04-11 DIAGNOSIS — K219 Gastro-esophageal reflux disease without esophagitis: Secondary | ICD-10-CM | POA: Diagnosis not present

## 2018-04-11 DIAGNOSIS — I951 Orthostatic hypotension: Secondary | ICD-10-CM | POA: Diagnosis not present

## 2018-04-11 DIAGNOSIS — G4733 Obstructive sleep apnea (adult) (pediatric): Secondary | ICD-10-CM | POA: Diagnosis not present

## 2018-04-11 DIAGNOSIS — I480 Paroxysmal atrial fibrillation: Secondary | ICD-10-CM | POA: Diagnosis not present

## 2018-04-11 DIAGNOSIS — E0590 Thyrotoxicosis, unspecified without thyrotoxic crisis or storm: Secondary | ICD-10-CM | POA: Diagnosis not present

## 2018-04-11 DIAGNOSIS — M1990 Unspecified osteoarthritis, unspecified site: Secondary | ICD-10-CM | POA: Diagnosis not present

## 2018-04-11 DIAGNOSIS — G629 Polyneuropathy, unspecified: Secondary | ICD-10-CM | POA: Diagnosis not present

## 2018-04-11 DIAGNOSIS — I10 Essential (primary) hypertension: Secondary | ICD-10-CM | POA: Diagnosis not present

## 2018-04-11 DIAGNOSIS — D649 Anemia, unspecified: Secondary | ICD-10-CM | POA: Diagnosis not present

## 2018-04-12 DIAGNOSIS — M79672 Pain in left foot: Secondary | ICD-10-CM | POA: Diagnosis not present

## 2018-04-12 DIAGNOSIS — R55 Syncope and collapse: Secondary | ICD-10-CM | POA: Diagnosis not present

## 2018-04-12 DIAGNOSIS — M1711 Unilateral primary osteoarthritis, right knee: Secondary | ICD-10-CM | POA: Diagnosis not present

## 2018-04-12 DIAGNOSIS — M79671 Pain in right foot: Secondary | ICD-10-CM | POA: Diagnosis not present

## 2018-04-12 DIAGNOSIS — R5383 Other fatigue: Secondary | ICD-10-CM | POA: Diagnosis not present

## 2018-04-13 DIAGNOSIS — K219 Gastro-esophageal reflux disease without esophagitis: Secondary | ICD-10-CM | POA: Diagnosis not present

## 2018-04-13 DIAGNOSIS — G4733 Obstructive sleep apnea (adult) (pediatric): Secondary | ICD-10-CM | POA: Diagnosis not present

## 2018-04-13 DIAGNOSIS — I480 Paroxysmal atrial fibrillation: Secondary | ICD-10-CM | POA: Diagnosis not present

## 2018-04-13 DIAGNOSIS — M1990 Unspecified osteoarthritis, unspecified site: Secondary | ICD-10-CM | POA: Diagnosis not present

## 2018-04-13 DIAGNOSIS — I951 Orthostatic hypotension: Secondary | ICD-10-CM | POA: Diagnosis not present

## 2018-04-13 DIAGNOSIS — E0590 Thyrotoxicosis, unspecified without thyrotoxic crisis or storm: Secondary | ICD-10-CM | POA: Diagnosis not present

## 2018-04-13 DIAGNOSIS — G629 Polyneuropathy, unspecified: Secondary | ICD-10-CM | POA: Diagnosis not present

## 2018-04-13 DIAGNOSIS — I10 Essential (primary) hypertension: Secondary | ICD-10-CM | POA: Diagnosis not present

## 2018-04-13 DIAGNOSIS — D649 Anemia, unspecified: Secondary | ICD-10-CM | POA: Diagnosis not present

## 2018-04-14 DIAGNOSIS — G629 Polyneuropathy, unspecified: Secondary | ICD-10-CM | POA: Diagnosis not present

## 2018-04-14 DIAGNOSIS — K219 Gastro-esophageal reflux disease without esophagitis: Secondary | ICD-10-CM | POA: Diagnosis not present

## 2018-04-14 DIAGNOSIS — G4733 Obstructive sleep apnea (adult) (pediatric): Secondary | ICD-10-CM | POA: Diagnosis not present

## 2018-04-14 DIAGNOSIS — I10 Essential (primary) hypertension: Secondary | ICD-10-CM | POA: Diagnosis not present

## 2018-04-14 DIAGNOSIS — I480 Paroxysmal atrial fibrillation: Secondary | ICD-10-CM | POA: Diagnosis not present

## 2018-04-14 DIAGNOSIS — I951 Orthostatic hypotension: Secondary | ICD-10-CM | POA: Diagnosis not present

## 2018-04-14 DIAGNOSIS — D649 Anemia, unspecified: Secondary | ICD-10-CM | POA: Diagnosis not present

## 2018-04-14 DIAGNOSIS — M1990 Unspecified osteoarthritis, unspecified site: Secondary | ICD-10-CM | POA: Diagnosis not present

## 2018-04-14 DIAGNOSIS — E0590 Thyrotoxicosis, unspecified without thyrotoxic crisis or storm: Secondary | ICD-10-CM | POA: Diagnosis not present

## 2018-04-15 DIAGNOSIS — I480 Paroxysmal atrial fibrillation: Secondary | ICD-10-CM | POA: Diagnosis not present

## 2018-04-15 DIAGNOSIS — I10 Essential (primary) hypertension: Secondary | ICD-10-CM | POA: Diagnosis not present

## 2018-04-15 DIAGNOSIS — D649 Anemia, unspecified: Secondary | ICD-10-CM | POA: Diagnosis not present

## 2018-04-15 DIAGNOSIS — E0590 Thyrotoxicosis, unspecified without thyrotoxic crisis or storm: Secondary | ICD-10-CM | POA: Diagnosis not present

## 2018-04-15 DIAGNOSIS — G629 Polyneuropathy, unspecified: Secondary | ICD-10-CM | POA: Diagnosis not present

## 2018-04-15 DIAGNOSIS — K219 Gastro-esophageal reflux disease without esophagitis: Secondary | ICD-10-CM | POA: Diagnosis not present

## 2018-04-15 DIAGNOSIS — G4733 Obstructive sleep apnea (adult) (pediatric): Secondary | ICD-10-CM | POA: Diagnosis not present

## 2018-04-15 DIAGNOSIS — M1990 Unspecified osteoarthritis, unspecified site: Secondary | ICD-10-CM | POA: Diagnosis not present

## 2018-04-15 DIAGNOSIS — I951 Orthostatic hypotension: Secondary | ICD-10-CM | POA: Diagnosis not present

## 2018-04-16 ENCOUNTER — Other Ambulatory Visit: Payer: Self-pay | Admitting: Cardiology

## 2018-04-18 DIAGNOSIS — E0590 Thyrotoxicosis, unspecified without thyrotoxic crisis or storm: Secondary | ICD-10-CM | POA: Diagnosis not present

## 2018-04-18 DIAGNOSIS — I951 Orthostatic hypotension: Secondary | ICD-10-CM | POA: Diagnosis not present

## 2018-04-18 DIAGNOSIS — I480 Paroxysmal atrial fibrillation: Secondary | ICD-10-CM | POA: Diagnosis not present

## 2018-04-18 DIAGNOSIS — G4733 Obstructive sleep apnea (adult) (pediatric): Secondary | ICD-10-CM | POA: Diagnosis not present

## 2018-04-18 DIAGNOSIS — M1990 Unspecified osteoarthritis, unspecified site: Secondary | ICD-10-CM | POA: Diagnosis not present

## 2018-04-18 DIAGNOSIS — G629 Polyneuropathy, unspecified: Secondary | ICD-10-CM | POA: Diagnosis not present

## 2018-04-18 DIAGNOSIS — D649 Anemia, unspecified: Secondary | ICD-10-CM | POA: Diagnosis not present

## 2018-04-18 DIAGNOSIS — I10 Essential (primary) hypertension: Secondary | ICD-10-CM | POA: Diagnosis not present

## 2018-04-18 DIAGNOSIS — K219 Gastro-esophageal reflux disease without esophagitis: Secondary | ICD-10-CM | POA: Diagnosis not present

## 2018-04-19 DIAGNOSIS — G4733 Obstructive sleep apnea (adult) (pediatric): Secondary | ICD-10-CM | POA: Diagnosis not present

## 2018-04-20 DIAGNOSIS — G629 Polyneuropathy, unspecified: Secondary | ICD-10-CM | POA: Diagnosis not present

## 2018-04-20 DIAGNOSIS — I10 Essential (primary) hypertension: Secondary | ICD-10-CM | POA: Diagnosis not present

## 2018-04-20 DIAGNOSIS — I951 Orthostatic hypotension: Secondary | ICD-10-CM | POA: Diagnosis not present

## 2018-04-20 DIAGNOSIS — I480 Paroxysmal atrial fibrillation: Secondary | ICD-10-CM | POA: Diagnosis not present

## 2018-04-20 DIAGNOSIS — M1990 Unspecified osteoarthritis, unspecified site: Secondary | ICD-10-CM | POA: Diagnosis not present

## 2018-04-20 DIAGNOSIS — D649 Anemia, unspecified: Secondary | ICD-10-CM | POA: Diagnosis not present

## 2018-04-20 DIAGNOSIS — E0590 Thyrotoxicosis, unspecified without thyrotoxic crisis or storm: Secondary | ICD-10-CM | POA: Diagnosis not present

## 2018-04-20 DIAGNOSIS — G4733 Obstructive sleep apnea (adult) (pediatric): Secondary | ICD-10-CM | POA: Diagnosis not present

## 2018-04-20 DIAGNOSIS — K219 Gastro-esophageal reflux disease without esophagitis: Secondary | ICD-10-CM | POA: Diagnosis not present

## 2018-04-21 DIAGNOSIS — M1990 Unspecified osteoarthritis, unspecified site: Secondary | ICD-10-CM | POA: Diagnosis not present

## 2018-04-21 DIAGNOSIS — I951 Orthostatic hypotension: Secondary | ICD-10-CM | POA: Diagnosis not present

## 2018-04-21 DIAGNOSIS — G4733 Obstructive sleep apnea (adult) (pediatric): Secondary | ICD-10-CM | POA: Diagnosis not present

## 2018-04-21 DIAGNOSIS — G629 Polyneuropathy, unspecified: Secondary | ICD-10-CM | POA: Diagnosis not present

## 2018-04-21 DIAGNOSIS — I480 Paroxysmal atrial fibrillation: Secondary | ICD-10-CM | POA: Diagnosis not present

## 2018-04-21 DIAGNOSIS — K219 Gastro-esophageal reflux disease without esophagitis: Secondary | ICD-10-CM | POA: Diagnosis not present

## 2018-04-21 DIAGNOSIS — D649 Anemia, unspecified: Secondary | ICD-10-CM | POA: Diagnosis not present

## 2018-04-21 DIAGNOSIS — E0590 Thyrotoxicosis, unspecified without thyrotoxic crisis or storm: Secondary | ICD-10-CM | POA: Diagnosis not present

## 2018-04-21 DIAGNOSIS — I10 Essential (primary) hypertension: Secondary | ICD-10-CM | POA: Diagnosis not present

## 2018-04-22 DIAGNOSIS — I480 Paroxysmal atrial fibrillation: Secondary | ICD-10-CM | POA: Diagnosis not present

## 2018-04-22 DIAGNOSIS — Z0189 Encounter for other specified special examinations: Secondary | ICD-10-CM | POA: Diagnosis not present

## 2018-04-22 DIAGNOSIS — G4733 Obstructive sleep apnea (adult) (pediatric): Secondary | ICD-10-CM | POA: Diagnosis not present

## 2018-04-22 DIAGNOSIS — I10 Essential (primary) hypertension: Secondary | ICD-10-CM | POA: Diagnosis not present

## 2018-04-25 DIAGNOSIS — N8110 Cystocele, unspecified: Secondary | ICD-10-CM | POA: Diagnosis not present

## 2018-04-25 DIAGNOSIS — N3946 Mixed incontinence: Secondary | ICD-10-CM | POA: Diagnosis not present

## 2018-04-25 DIAGNOSIS — I480 Paroxysmal atrial fibrillation: Secondary | ICD-10-CM | POA: Diagnosis not present

## 2018-04-25 DIAGNOSIS — Z7901 Long term (current) use of anticoagulants: Secondary | ICD-10-CM | POA: Diagnosis not present

## 2018-04-25 DIAGNOSIS — N390 Urinary tract infection, site not specified: Secondary | ICD-10-CM | POA: Diagnosis not present

## 2018-04-26 DIAGNOSIS — D649 Anemia, unspecified: Secondary | ICD-10-CM | POA: Diagnosis not present

## 2018-04-26 DIAGNOSIS — I951 Orthostatic hypotension: Secondary | ICD-10-CM | POA: Diagnosis not present

## 2018-04-26 DIAGNOSIS — K219 Gastro-esophageal reflux disease without esophagitis: Secondary | ICD-10-CM | POA: Diagnosis not present

## 2018-04-26 DIAGNOSIS — G4733 Obstructive sleep apnea (adult) (pediatric): Secondary | ICD-10-CM | POA: Diagnosis not present

## 2018-04-26 DIAGNOSIS — E0590 Thyrotoxicosis, unspecified without thyrotoxic crisis or storm: Secondary | ICD-10-CM | POA: Diagnosis not present

## 2018-04-26 DIAGNOSIS — G629 Polyneuropathy, unspecified: Secondary | ICD-10-CM | POA: Diagnosis not present

## 2018-04-26 DIAGNOSIS — M1990 Unspecified osteoarthritis, unspecified site: Secondary | ICD-10-CM | POA: Diagnosis not present

## 2018-04-26 DIAGNOSIS — I480 Paroxysmal atrial fibrillation: Secondary | ICD-10-CM | POA: Diagnosis not present

## 2018-04-26 DIAGNOSIS — I10 Essential (primary) hypertension: Secondary | ICD-10-CM | POA: Diagnosis not present

## 2018-04-27 DIAGNOSIS — M25561 Pain in right knee: Secondary | ICD-10-CM | POA: Diagnosis not present

## 2018-04-28 DIAGNOSIS — K219 Gastro-esophageal reflux disease without esophagitis: Secondary | ICD-10-CM | POA: Diagnosis not present

## 2018-04-28 DIAGNOSIS — G629 Polyneuropathy, unspecified: Secondary | ICD-10-CM | POA: Diagnosis not present

## 2018-04-28 DIAGNOSIS — I480 Paroxysmal atrial fibrillation: Secondary | ICD-10-CM | POA: Diagnosis not present

## 2018-04-28 DIAGNOSIS — D649 Anemia, unspecified: Secondary | ICD-10-CM | POA: Diagnosis not present

## 2018-04-28 DIAGNOSIS — I951 Orthostatic hypotension: Secondary | ICD-10-CM | POA: Diagnosis not present

## 2018-04-28 DIAGNOSIS — G4733 Obstructive sleep apnea (adult) (pediatric): Secondary | ICD-10-CM | POA: Diagnosis not present

## 2018-04-28 DIAGNOSIS — I10 Essential (primary) hypertension: Secondary | ICD-10-CM | POA: Diagnosis not present

## 2018-04-28 DIAGNOSIS — E0590 Thyrotoxicosis, unspecified without thyrotoxic crisis or storm: Secondary | ICD-10-CM | POA: Diagnosis not present

## 2018-04-28 DIAGNOSIS — M1990 Unspecified osteoarthritis, unspecified site: Secondary | ICD-10-CM | POA: Diagnosis not present

## 2018-04-29 DIAGNOSIS — E0590 Thyrotoxicosis, unspecified without thyrotoxic crisis or storm: Secondary | ICD-10-CM | POA: Diagnosis not present

## 2018-04-29 DIAGNOSIS — M1990 Unspecified osteoarthritis, unspecified site: Secondary | ICD-10-CM | POA: Diagnosis not present

## 2018-04-29 DIAGNOSIS — G629 Polyneuropathy, unspecified: Secondary | ICD-10-CM | POA: Diagnosis not present

## 2018-04-29 DIAGNOSIS — K219 Gastro-esophageal reflux disease without esophagitis: Secondary | ICD-10-CM | POA: Diagnosis not present

## 2018-04-29 DIAGNOSIS — D649 Anemia, unspecified: Secondary | ICD-10-CM | POA: Diagnosis not present

## 2018-04-29 DIAGNOSIS — G4733 Obstructive sleep apnea (adult) (pediatric): Secondary | ICD-10-CM | POA: Diagnosis not present

## 2018-04-29 DIAGNOSIS — I951 Orthostatic hypotension: Secondary | ICD-10-CM | POA: Diagnosis not present

## 2018-04-29 DIAGNOSIS — I10 Essential (primary) hypertension: Secondary | ICD-10-CM | POA: Diagnosis not present

## 2018-04-29 DIAGNOSIS — I480 Paroxysmal atrial fibrillation: Secondary | ICD-10-CM | POA: Diagnosis not present

## 2018-05-03 DIAGNOSIS — E0590 Thyrotoxicosis, unspecified without thyrotoxic crisis or storm: Secondary | ICD-10-CM | POA: Diagnosis not present

## 2018-05-03 DIAGNOSIS — G4733 Obstructive sleep apnea (adult) (pediatric): Secondary | ICD-10-CM | POA: Diagnosis not present

## 2018-05-03 DIAGNOSIS — K219 Gastro-esophageal reflux disease without esophagitis: Secondary | ICD-10-CM | POA: Diagnosis not present

## 2018-05-03 DIAGNOSIS — I951 Orthostatic hypotension: Secondary | ICD-10-CM | POA: Diagnosis not present

## 2018-05-03 DIAGNOSIS — I480 Paroxysmal atrial fibrillation: Secondary | ICD-10-CM | POA: Diagnosis not present

## 2018-05-03 DIAGNOSIS — D649 Anemia, unspecified: Secondary | ICD-10-CM | POA: Diagnosis not present

## 2018-05-03 DIAGNOSIS — I10 Essential (primary) hypertension: Secondary | ICD-10-CM | POA: Diagnosis not present

## 2018-05-03 DIAGNOSIS — M1990 Unspecified osteoarthritis, unspecified site: Secondary | ICD-10-CM | POA: Diagnosis not present

## 2018-05-03 DIAGNOSIS — G629 Polyneuropathy, unspecified: Secondary | ICD-10-CM | POA: Diagnosis not present

## 2018-05-04 DIAGNOSIS — E0590 Thyrotoxicosis, unspecified without thyrotoxic crisis or storm: Secondary | ICD-10-CM | POA: Diagnosis not present

## 2018-05-04 DIAGNOSIS — I10 Essential (primary) hypertension: Secondary | ICD-10-CM | POA: Diagnosis not present

## 2018-05-04 DIAGNOSIS — I480 Paroxysmal atrial fibrillation: Secondary | ICD-10-CM | POA: Diagnosis not present

## 2018-05-04 DIAGNOSIS — K219 Gastro-esophageal reflux disease without esophagitis: Secondary | ICD-10-CM | POA: Diagnosis not present

## 2018-05-04 DIAGNOSIS — I951 Orthostatic hypotension: Secondary | ICD-10-CM | POA: Diagnosis not present

## 2018-05-04 DIAGNOSIS — G4733 Obstructive sleep apnea (adult) (pediatric): Secondary | ICD-10-CM | POA: Diagnosis not present

## 2018-05-04 DIAGNOSIS — G629 Polyneuropathy, unspecified: Secondary | ICD-10-CM | POA: Diagnosis not present

## 2018-05-04 DIAGNOSIS — D649 Anemia, unspecified: Secondary | ICD-10-CM | POA: Diagnosis not present

## 2018-05-04 DIAGNOSIS — M1990 Unspecified osteoarthritis, unspecified site: Secondary | ICD-10-CM | POA: Diagnosis not present

## 2018-05-05 DIAGNOSIS — F419 Anxiety disorder, unspecified: Secondary | ICD-10-CM | POA: Diagnosis not present

## 2018-05-05 DIAGNOSIS — I10 Essential (primary) hypertension: Secondary | ICD-10-CM | POA: Diagnosis not present

## 2018-05-05 DIAGNOSIS — I480 Paroxysmal atrial fibrillation: Secondary | ICD-10-CM | POA: Diagnosis not present

## 2018-05-06 DIAGNOSIS — I480 Paroxysmal atrial fibrillation: Secondary | ICD-10-CM | POA: Diagnosis not present

## 2018-05-06 DIAGNOSIS — I10 Essential (primary) hypertension: Secondary | ICD-10-CM | POA: Diagnosis not present

## 2018-05-06 DIAGNOSIS — M1990 Unspecified osteoarthritis, unspecified site: Secondary | ICD-10-CM | POA: Diagnosis not present

## 2018-05-06 DIAGNOSIS — G4733 Obstructive sleep apnea (adult) (pediatric): Secondary | ICD-10-CM | POA: Diagnosis not present

## 2018-05-06 DIAGNOSIS — E0590 Thyrotoxicosis, unspecified without thyrotoxic crisis or storm: Secondary | ICD-10-CM | POA: Diagnosis not present

## 2018-05-06 DIAGNOSIS — G629 Polyneuropathy, unspecified: Secondary | ICD-10-CM | POA: Diagnosis not present

## 2018-05-06 DIAGNOSIS — I951 Orthostatic hypotension: Secondary | ICD-10-CM | POA: Diagnosis not present

## 2018-05-06 DIAGNOSIS — D649 Anemia, unspecified: Secondary | ICD-10-CM | POA: Diagnosis not present

## 2018-05-06 DIAGNOSIS — K219 Gastro-esophageal reflux disease without esophagitis: Secondary | ICD-10-CM | POA: Diagnosis not present

## 2018-05-10 DIAGNOSIS — I951 Orthostatic hypotension: Secondary | ICD-10-CM | POA: Diagnosis not present

## 2018-05-10 DIAGNOSIS — K219 Gastro-esophageal reflux disease without esophagitis: Secondary | ICD-10-CM | POA: Diagnosis not present

## 2018-05-10 DIAGNOSIS — M1990 Unspecified osteoarthritis, unspecified site: Secondary | ICD-10-CM | POA: Diagnosis not present

## 2018-05-10 DIAGNOSIS — I480 Paroxysmal atrial fibrillation: Secondary | ICD-10-CM | POA: Diagnosis not present

## 2018-05-10 DIAGNOSIS — G4733 Obstructive sleep apnea (adult) (pediatric): Secondary | ICD-10-CM | POA: Diagnosis not present

## 2018-05-10 DIAGNOSIS — G629 Polyneuropathy, unspecified: Secondary | ICD-10-CM | POA: Diagnosis not present

## 2018-05-10 DIAGNOSIS — D649 Anemia, unspecified: Secondary | ICD-10-CM | POA: Diagnosis not present

## 2018-05-10 DIAGNOSIS — I10 Essential (primary) hypertension: Secondary | ICD-10-CM | POA: Diagnosis not present

## 2018-05-10 DIAGNOSIS — E0590 Thyrotoxicosis, unspecified without thyrotoxic crisis or storm: Secondary | ICD-10-CM | POA: Diagnosis not present

## 2018-05-11 DIAGNOSIS — N390 Urinary tract infection, site not specified: Secondary | ICD-10-CM | POA: Diagnosis not present

## 2018-05-16 DIAGNOSIS — E0590 Thyrotoxicosis, unspecified without thyrotoxic crisis or storm: Secondary | ICD-10-CM | POA: Diagnosis not present

## 2018-05-16 DIAGNOSIS — G4733 Obstructive sleep apnea (adult) (pediatric): Secondary | ICD-10-CM | POA: Diagnosis not present

## 2018-05-16 DIAGNOSIS — I951 Orthostatic hypotension: Secondary | ICD-10-CM | POA: Diagnosis not present

## 2018-05-16 DIAGNOSIS — G629 Polyneuropathy, unspecified: Secondary | ICD-10-CM | POA: Diagnosis not present

## 2018-05-16 DIAGNOSIS — K219 Gastro-esophageal reflux disease without esophagitis: Secondary | ICD-10-CM | POA: Diagnosis not present

## 2018-05-16 DIAGNOSIS — I10 Essential (primary) hypertension: Secondary | ICD-10-CM | POA: Diagnosis not present

## 2018-05-16 DIAGNOSIS — M1990 Unspecified osteoarthritis, unspecified site: Secondary | ICD-10-CM | POA: Diagnosis not present

## 2018-05-16 DIAGNOSIS — D649 Anemia, unspecified: Secondary | ICD-10-CM | POA: Diagnosis not present

## 2018-05-16 DIAGNOSIS — I480 Paroxysmal atrial fibrillation: Secondary | ICD-10-CM | POA: Diagnosis not present

## 2018-05-20 DIAGNOSIS — G4733 Obstructive sleep apnea (adult) (pediatric): Secondary | ICD-10-CM | POA: Diagnosis not present

## 2018-05-30 ENCOUNTER — Other Ambulatory Visit: Payer: Self-pay | Admitting: Cardiology

## 2018-05-30 ENCOUNTER — Telehealth: Payer: Self-pay | Admitting: Cardiology

## 2018-05-30 NOTE — Telephone Encounter (Signed)
Patient called complaining of feeling lightheaded. Symptoms have improved after drinking water. Last BP 92/54 mmHg, HR 61 bpm. Asked her to keep well hydrated and perhaps skip diltiazem. Patient has not been taking flecainide as it makes her dizzy. Will have clinical staff follow up tomorrow morning if she would like to be seen for follow up.  MJP

## 2018-05-31 ENCOUNTER — Ambulatory Visit (INDEPENDENT_AMBULATORY_CARE_PROVIDER_SITE_OTHER): Payer: Medicare PPO | Admitting: Neurology

## 2018-05-31 ENCOUNTER — Other Ambulatory Visit: Payer: Self-pay

## 2018-05-31 ENCOUNTER — Encounter: Payer: Self-pay | Admitting: Neurology

## 2018-05-31 VITALS — BP 141/82 | HR 74 | Ht 65.0 in | Wt 155.5 lb

## 2018-05-31 DIAGNOSIS — M7552 Bursitis of left shoulder: Secondary | ICD-10-CM | POA: Diagnosis not present

## 2018-05-31 DIAGNOSIS — G4733 Obstructive sleep apnea (adult) (pediatric): Secondary | ICD-10-CM | POA: Diagnosis not present

## 2018-05-31 DIAGNOSIS — G629 Polyneuropathy, unspecified: Secondary | ICD-10-CM

## 2018-05-31 DIAGNOSIS — R200 Anesthesia of skin: Secondary | ICD-10-CM

## 2018-05-31 DIAGNOSIS — R413 Other amnesia: Secondary | ICD-10-CM | POA: Diagnosis not present

## 2018-05-31 DIAGNOSIS — G4489 Other headache syndrome: Secondary | ICD-10-CM | POA: Diagnosis not present

## 2018-05-31 NOTE — Telephone Encounter (Signed)
Awaiting call back from patient. LMOM
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
## 2018-06-01 ENCOUNTER — Encounter: Payer: Self-pay | Admitting: Cardiology

## 2018-06-01 ENCOUNTER — Ambulatory Visit (INDEPENDENT_AMBULATORY_CARE_PROVIDER_SITE_OTHER): Payer: Medicare PPO | Admitting: Cardiology

## 2018-06-01 VITALS — BP 108/65 | HR 78 | Ht 65.0 in | Wt 154.3 lb

## 2018-06-01 DIAGNOSIS — G4733 Obstructive sleep apnea (adult) (pediatric): Secondary | ICD-10-CM

## 2018-06-01 DIAGNOSIS — R42 Dizziness and giddiness: Secondary | ICD-10-CM

## 2018-06-01 DIAGNOSIS — I480 Paroxysmal atrial fibrillation: Secondary | ICD-10-CM

## 2018-06-01 DIAGNOSIS — Z9989 Dependence on other enabling machines and devices: Secondary | ICD-10-CM

## 2018-06-02 ENCOUNTER — Encounter: Payer: Self-pay | Admitting: Cardiology

## 2018-06-02 DIAGNOSIS — R42 Dizziness and giddiness: Secondary | ICD-10-CM | POA: Insufficient documentation

## 2018-06-02 MED ORDER — DILTIAZEM HCL ER COATED BEADS 120 MG PO CP24: 120.0000 mg | ORAL_CAPSULE | ORAL | 3 refills | Status: DC

## 2018-06-02 NOTE — Progress Notes (Signed)
Patient is here for follow up visit.  Subjective:   @Patient  ID: Tacey Heap, female    DOB: Nov 08, 1940, 78 y.o.   MRN: [id number]  Chief Complaint  Patient presents with  . Hypotension    F/U for low BP    HPI   78 year old Caucasian female with hypertension, hyperlipidemia, paroxysmal atrial fibrillation, obstructive sleep apnea on CPAP.  Here today with her husband.  She has had episode of lightheadedness for the past few days.  Patient and husband are convinced that her symptoms occur only when she takes flecainide, and thus would like to discontinue this.  She also reports .  Denies fever, chills.   Past Medical History:  Diagnosis Date  . A-fib ([place])   . Anemia   . Arthritis    "joints" (11/10/2017)  . Atrial fibrillation ([place])   . Dizziness   . GERD (gastroesophageal reflux disease)   . Hearing loss   . History of blood transfusion 10/2017  . Hyperlipidemia   . Hypertension   . Hyperthyroidism   . Melanoma ([place])    "cut off my back"  . Memory loss   . OSA on CPAP   . Sinus headache   . Sleep apnea     Past Surgical History:  Procedure Laterality Date  . ABDOMINAL HYSTERECTOMY     "partial"  . APPENDECTOMY    . BIOPSY  11/17/2017   Procedure: BIOPSY;  Surgeon: Clarene Essex, MD;  Location: [facility];  Service: Endoscopy;;  . COLONOSCOPY WITH PROPOFOL N/A 11/17/2017   Procedure: COLONOSCOPY WITH PROPOFOL;  Surgeon: Clarene Essex, MD;  Location: [facility];  Service: Endoscopy;  Laterality: N/A;  . CRANIECTOMY FOR EXCISION OF ACOUSTIC NEUROMA    . ESOPHAGOGASTRODUODENOSCOPY (EGD) WITH PROPOFOL N/A 10/28/2017   Procedure: ESOPHAGOGASTRODUODENOSCOPY (EGD) WITH PROPOFOL;  Surgeon: Clarene Essex, MD;  Location: [facility];  Service: Endoscopy;  Laterality: N/A;  . KNEE ARTHROSCOPY Left   . LEFT HEART CATH AND CORONARY ANGIOGRAPHY N/A 11/24/2016   Procedure: LEFT HEART CATH AND CORONARY ANGIOGRAPHY;  Surgeon: Jettie Booze, MD;  Location: [facility];  Service: Cardiovascular;  Laterality: N/A;  . MELANOMA EXCISION     "off my back"  . NASAL SINUS SURGERY    . TONSILLECTOMY      Social History   Socioeconomic History  . Marital status: Married    Spouse name: Not on file  . Number of children: 2  . Years of education: Not on file  . Highest education level: Not on file  Occupational History  . Not on file  Social Needs  . Financial resource strain: Not on file  . Food insecurity:    Worry: Not on file    Inability: Not on file  . Transportation needs:    Medical: Not on file    Non-medical: Not on file  Tobacco Use  . Smoking status: Former Smoker    Years: 2.00    Types: Cigarettes    Last attempt to quit: 1980    Years since quitting: 40.1  . Smokeless tobacco: Never Used  . Tobacco comment: 11/10/2017 "only smoked when I was on my period"  Substance and Sexual Activity  . Alcohol use: Yes    Comment: 11/10/2017 "nothing lately; did have 1 glass of wine q night"  . Drug use: Never  . Sexual activity: Not Currently  Lifestyle  . Physical activity:    Days per week: Not on file  Minutes per session: Not on file  . Stress: Not on file  Relationships  . Social connections:    Talks on phone: Not on file    Gets together: Not on file    Attends religious service: Not on file    Active member of club or organization: Not on file    Attends meetings of clubs or organizations: Not on file    Relationship status: Not on file  . Intimate partner violence:    Fear of current or ex partner: Not on file    Emotionally abused: Not on file    Physically abused: Not on file    Forced sexual activity: Not on file  Other Topics Concern  . Not on file  Social History Narrative   Right handed     Current Outpatient Medications on File Prior to Visit  Medication Sig Dispense Refill  . acetaminophen (TYLENOL) 500 MG tablet Take 500 mg by mouth every 6 (six) hours as needed.    . Alpha-Lipoic Acid (LIPOIC ACID PO)  Take 600 mg by mouth 2 (two) times daily.     Marland Kitchen atorvastatin (LIPITOR) 10 MG tablet TAKE 1 TABLET BY MOUTH EVERY DAY AT 6PM *NEED OFFICE VISIT* 15 tablet 0  . Calcium Carbonate-Vitamin D3 600-400 MG-UNIT TABS Take 1 tablet by mouth daily.    Marland Kitchen CALCIUM/MAGNESIUM/ZINC FORMULA PO Take 1 tablet by mouth daily.    . cetirizine (ZYRTEC) 10 MG tablet Take 10 mg by mouth daily.    . cholecalciferol (VITAMIN D) 1000 UNITS tablet Take 1,000 Units by mouth daily.    Marland Kitchen CRANBERRY CONCENTRATE PO Take 650 mg by mouth daily.     . DULoxetine (CYMBALTA) 30 MG capsule TAKE 1 CAPSULE (30 MG TOTAL) BY MOUTH 2 (TWO) TIMES DAILY. 180 capsule 2  . ELIQUIS 5 MG TABS tablet Take 5 mg by mouth 2 (two) times daily.  1  . flecainide (TAMBOCOR) 50 MG tablet Take 0.5 tablets (25 mg total) by mouth 2 (two) times daily. 30 tablet 0  . GLUCOSAMINE-CHONDROITIN PO Take 2 tablets by mouth daily with lunch. Glucosamine 1500mg  Chondroitin 1200mg     . lactobacillus acidophilus (BACID) TABS tablet Take 2 tablets by mouth 3 (three) times daily.    Marland Kitchen levothyroxine (SYNTHROID, LEVOTHROID) 150 MCG tablet Take 150 mcg by mouth daily before breakfast.    . losartan (COZAAR) 50 MG tablet Take 1 tablet (50 mg total) by mouth daily. 30 tablet 0  . Lutein-Zeaxanthin 25-5 MG CAPS Take 1 tablet by mouth daily.    . Melatonin 5 MG TABS Take 5 mg by mouth at bedtime.    . Multiple Vitamin (MULTIVITAMIN WITH MINERALS) TABS tablet Take 1 tablet by mouth daily.    . nitroGLYCERIN (NITROLINGUAL) 0.4 MG/SPRAY spray Place 1 spray under the tongue every 5 (five) minutes x 3 doses as needed for chest pain.    . Omega-3 1000 MG CAPS Take 1,000 mg by mouth daily.    . pantoprazole (PROTONIX) 40 MG tablet Take 40 mg by mouth daily before breakfast.   1  . simethicone (MYLICON) 80 MG chewable tablet Chew 80 mg by mouth every 6 (six) hours as needed for flatulence.    . sucralfate (CARAFATE) 1 g tablet Take 1 g by mouth 4 (four) times daily -  with meals and at  bedtime.    Marland Kitchen thyroid (ARMOUR) 60 MG tablet Take 60 mg by mouth daily before breakfast.    . tiZANidine (ZANAFLEX) 4 MG  tablet TAKE 1 TABLET BY MOUTH THREE TIMES A DAY AS NEEDED (Patient taking differently: Take 2 mg by mouth every 8 (eight) hours as needed for muscle spasms. ) 90 tablet 5  . trimethoprim (TRIMPEX) 100 MG tablet Take 100 mg by mouth daily.    . vitamin C (ASCORBIC ACID) 500 MG tablet Take 1,000 mg by mouth daily.     Marland Kitchen buPROPion (WELLBUTRIN XL) 150 MG 24 hr tablet Take 150 mg by mouth daily.   4  . cycloSPORINE (RESTASIS) 0.05 % ophthalmic emulsion Place 1 drop into both eyes 2 (two) times daily.     No current facility-administered medications on file prior to visit.     Cardiovascular studies:   Event monitor 03/29/2018 - 04/12/2018: No significant arrhythmias seen.  SInus rhythm 58-111 bpm.  Cath 11/2016: Normal coronaries  Review of Systems  Constitution: Positive for malaise/fatigue. Negative for decreased appetite, weight gain and weight loss.  HENT: Negative for congestion.   Eyes: Negative for visual disturbance.  Cardiovascular: Negative for chest pain, dyspnea on exertion, leg swelling, palpitations and syncope.  Respiratory: Negative for shortness of breath.   Endocrine: Negative for cold intolerance.  Hematologic/Lymphatic: Does not bruise/bleed easily.  Skin: Negative for itching and rash.  Musculoskeletal: Negative for myalgias.  Gastrointestinal: Negative for abdominal pain, nausea and vomiting.  Genitourinary: Negative for dysuria.  Neurological: Positive for light-headedness. Negative for dizziness and weakness.  Psychiatric/Behavioral: The patient is not nervous/anxious.   All other systems reviewed and are negative.      Objective:   Vitals:   06/01/18 1502 06/01/18 1503  BP: 112/72 108/65  Pulse: 70 78  SpO2: 97% 95%     Physical Exam  Constitutional: She is oriented to person, place, and time. She appears well-developed and  well-nourished. No distress.  HENT:  Head: Normocephalic and atraumatic.  Eyes: Pupils are equal, round, and reactive to light. Conjunctivae are normal.  Neck: No JVD present.  Cardiovascular: Normal rate, regular rhythm and intact distal pulses.  No murmur heard. Pulmonary/Chest: Effort normal and breath sounds normal. She has no wheezes. She has no rales.  Abdominal: Soft. Bowel sounds are normal. There is no rebound.  Musculoskeletal:        General: No edema.  Lymphadenopathy:    She has no cervical adenopathy.  Neurological: She is alert and oriented to person, place, and time. No cranial nerve deficit.  Skin: Skin is warm and dry.  Psychiatric: She has a normal mood and affect.  Nursing note and vitals reviewed.       Assessment & Recommendations:   78 year old Caucasian female with hypertension, hyperlipidemia, paroxysmal atrial fibrillation, obstructive sleep apnea on CPAP.  1. Lightheadedness Convinced this is secondary to flecainide and would like to stop it. Okay with me. While orthostatics are negative, blood pressure is normal. I reduced diltiazem 120 mg to every other day.   2. Paroxysmal atrial fibrillation (HCC) Currently in sinus rhythm by exam [id number] score 3. Annual stroke risk 3%. Continue Eliquis 5 mg bid.   3. OSA on CPAP Continue PSA.   4. Generalized fatigue: Likely multifactorial. Do not see any acute cardiac issues.    Nigel Mormon, MD [facility] Pager: [phone] Office: [phone] If no answer Cell [phone]

## 2018-06-03 DIAGNOSIS — N390 Urinary tract infection, site not specified: Secondary | ICD-10-CM | POA: Diagnosis not present

## 2018-06-06 ENCOUNTER — Other Ambulatory Visit: Payer: Self-pay | Admitting: Cardiology

## 2018-06-06 DIAGNOSIS — I480 Paroxysmal atrial fibrillation: Secondary | ICD-10-CM

## 2018-06-08 DIAGNOSIS — N819 Female genital prolapse, unspecified: Secondary | ICD-10-CM | POA: Insufficient documentation

## 2018-06-08 DIAGNOSIS — N8111 Cystocele, midline: Secondary | ICD-10-CM | POA: Diagnosis not present

## 2018-06-08 DIAGNOSIS — N398 Other specified disorders of urinary system: Secondary | ICD-10-CM | POA: Diagnosis not present

## 2018-06-08 DIAGNOSIS — R338 Other retention of urine: Secondary | ICD-10-CM | POA: Diagnosis not present

## 2018-06-08 DIAGNOSIS — N390 Urinary tract infection, site not specified: Secondary | ICD-10-CM | POA: Diagnosis not present

## 2018-06-08 DIAGNOSIS — N3946 Mixed incontinence: Secondary | ICD-10-CM | POA: Diagnosis not present

## 2018-06-08 DIAGNOSIS — I480 Paroxysmal atrial fibrillation: Secondary | ICD-10-CM | POA: Diagnosis not present

## 2018-06-18 DIAGNOSIS — G4733 Obstructive sleep apnea (adult) (pediatric): Secondary | ICD-10-CM | POA: Diagnosis not present

## 2018-06-20 ENCOUNTER — Emergency Department (HOSPITAL_COMMUNITY): Payer: Medicare PPO

## 2018-06-20 ENCOUNTER — Encounter (HOSPITAL_COMMUNITY): Payer: Self-pay | Admitting: Emergency Medicine

## 2018-06-20 ENCOUNTER — Emergency Department (HOSPITAL_COMMUNITY)
Admission: EM | Admit: 2018-06-20 | Discharge: 2018-06-21 | Disposition: A | Discharge status: Home / Self Care | Payer: Medicare PPO | Attending: Emergency Medicine | Admitting: Emergency Medicine

## 2018-06-20 DIAGNOSIS — Z7901 Long term (current) use of anticoagulants: Secondary | ICD-10-CM | POA: Insufficient documentation

## 2018-06-20 DIAGNOSIS — Z87891 Personal history of nicotine dependence: Secondary | ICD-10-CM | POA: Diagnosis not present

## 2018-06-20 DIAGNOSIS — Z79899 Other long term (current) drug therapy: Secondary | ICD-10-CM | POA: Insufficient documentation

## 2018-06-20 DIAGNOSIS — I4891 Unspecified atrial fibrillation: Secondary | ICD-10-CM | POA: Diagnosis not present

## 2018-06-20 DIAGNOSIS — R42 Dizziness and giddiness: Secondary | ICD-10-CM | POA: Diagnosis not present

## 2018-06-20 DIAGNOSIS — E079 Disorder of thyroid, unspecified: Secondary | ICD-10-CM | POA: Diagnosis not present

## 2018-06-20 DIAGNOSIS — Z85828 Personal history of other malignant neoplasm of skin: Secondary | ICD-10-CM | POA: Insufficient documentation

## 2018-06-20 DIAGNOSIS — I10 Essential (primary) hypertension: Secondary | ICD-10-CM | POA: Insufficient documentation

## 2018-06-20 DIAGNOSIS — I499 Cardiac arrhythmia, unspecified: Secondary | ICD-10-CM | POA: Diagnosis not present

## 2018-06-20 DIAGNOSIS — N390 Urinary tract infection, site not specified: Secondary | ICD-10-CM | POA: Diagnosis not present

## 2018-06-20 DIAGNOSIS — J9811 Atelectasis: Secondary | ICD-10-CM | POA: Diagnosis not present

## 2018-06-20 DIAGNOSIS — R0902 Hypoxemia: Secondary | ICD-10-CM | POA: Diagnosis not present

## 2018-06-20 DIAGNOSIS — R000 Tachycardia, unspecified: Secondary | ICD-10-CM | POA: Diagnosis not present

## 2018-06-20 DIAGNOSIS — R531 Weakness: Secondary | ICD-10-CM | POA: Diagnosis present

## 2018-06-20 LAB — CBC WITH DIFFERENTIAL/PLATELET
Abs Immature Granulocytes: 0.03 10*3/uL (ref 0.00–0.07)
Basophils Absolute: 0.1 10*3/uL (ref 0.0–0.1)
Basophils Relative: 1 %
Eosinophils Absolute: 0.1 10*3/uL (ref 0.0–0.5)
Eosinophils Relative: 1 %
HCT: 40 % (ref 36.0–46.0)
Hemoglobin: 12.6 g/dL (ref 12.0–15.0)
Immature Granulocytes: 0 %
Lymphocytes Relative: 13 %
Lymphs Abs: 1.6 10*3/uL (ref 0.7–4.0)
MCH: 29.6 pg (ref 26.0–34.0)
MCHC: 31.5 g/dL (ref 30.0–36.0)
MCV: 93.9 fL (ref 80.0–100.0)
Monocytes Absolute: 1.1 10*3/uL — ABNORMAL HIGH (ref 0.1–1.0)
Monocytes Relative: 9 %
NEUTROS PCT: 76 %
NRBC: 0 % (ref 0.0–0.2)
Neutro Abs: 9.4 10*3/uL — ABNORMAL HIGH (ref 1.7–7.7)
Platelets: 210 10*3/uL (ref 150–400)
RBC: 4.26 MIL/uL (ref 3.87–5.11)
RDW: 13.2 % (ref 11.5–15.5)
WBC: 12.3 10*3/uL — ABNORMAL HIGH (ref 4.0–10.5)

## 2018-06-20 LAB — BASIC METABOLIC PANEL
Anion gap: 14 (ref 5–15)
BUN: 23 mg/dL (ref 8–23)
CALCIUM: 9.6 mg/dL (ref 8.9–10.3)
CO2: 24 mmol/L (ref 22–32)
Chloride: 101 mmol/L (ref 98–111)
Creatinine, Ser: 1.01 mg/dL — ABNORMAL HIGH (ref 0.44–1.00)
GFR calc Af Amer: >60 mL/min (ref >60)
GFR, EST NON AFRICAN AMERICAN: 54 mL/min — AB (ref >60)
Glucose, Bld: 134 mg/dL — ABNORMAL HIGH (ref 70–99)
Potassium: 2.8 mmol/L — ABNORMAL LOW (ref 3.5–5.1)
SODIUM: 139 mmol/L (ref 135–145)

## 2018-06-20 LAB — MAGNESIUM: Magnesium: 2.2 mg/dL (ref 1.7–2.4)

## 2018-06-20 MED ORDER — DILTIAZEM HCL 25 MG/5ML IV SOLN: 15.0000 mg | Freq: Once | INTRAVENOUS | Status: DC

## 2018-06-20 MED ORDER — SODIUM CHLORIDE 0.9 % IV BOLUS
1000.0000 mL | Freq: Once | INTRAVENOUS | Status: AC
  Administered 2018-06-20: 1000 mL via INTRAVENOUS

## 2018-06-20 NOTE — ED Provider Notes (Signed)
[facility] EMERGENCY DEPARTMENT Provider Note   CSN: [id number] Arrival date & time: 06/20/18  2039    History   Chief Complaint Chief Complaint  Patient presents with  . Weakness    HPI Cynthia [name] is a 77 y.o. female.     The history is provided by the patient.  Weakness  Severity:  Mild Onset quality:  Sudden Timing:  Rare Progression:  Resolved Chronicity:  New Context: recent infection (recent UTI)   Relieved by:  Nothing Worsened by:  Nothing Associated symptoms: dizziness and near-syncope (recent hx of lightheadness and postural dizziness )   Associated symptoms: no abdominal pain, no aphasia, no arthralgias, no chest pain, no cough, no difficulty walking, no dysuria, no fever, no lethargy, no loss of consciousness, no seizures, no shortness of breath, no stroke symptoms and no vomiting     Past Medical History:  Diagnosis Date  . A-fib ([place])   . Anemia   . Arthritis    "joints" (11/10/2017)  . Atrial fibrillation ([place])   . Dizziness   . GERD (gastroesophageal reflux disease)   . Hearing loss   . History of blood transfusion 10/2017  . Hyperlipidemia   . Hypertension   . Hyperthyroidism   . Melanoma ([place])    "cut off my back"  . Memory loss   . OSA on CPAP   . Sinus headache   . Sleep apnea     Patient Active Problem List   Diagnosis Date Noted  . Lightheadedness 06/02/2018  . Near syncope   . Hypotension 03/17/2018  . Postural dizziness with presyncope 03/17/2018  . PAF (paroxysmal atrial fibrillation) ([place]) 03/17/2018  . Memory loss 11/25/2017  . GI bleed 11/06/2017  . Acute lower UTI 10/29/2017  . Acute GI bleeding 10/27/2017  . Acute blood loss anemia 10/27/2017  . Abnormal stress test   . Chest pain 11/23/2016  . Hyperlipidemia 11/23/2016  . GERD (gastroesophageal reflux disease) 11/23/2016  . History of recurrent UTIs 11/23/2016  . Polyneuropathy 10/30/2016  . Bursitis, subacromial 10/30/2016  . Numbness  10/30/2016  . Arthritis, senescent 01/03/2016  . Left knee pain 01/03/2016  . Pain in joint, ankle and foot 02/28/2015  . Dry mouth 08/07/2014  . Dry eyes 08/07/2014  . Other headache syndrome 06/25/2014  . Sinusitis, chronic 06/25/2014  . Neck pain 06/25/2014  . OSA (obstructive sleep apnea) 06/25/2014  . Essential hypertension, benign 06/25/2014    Past Surgical History:  Procedure Laterality Date  . ABDOMINAL HYSTERECTOMY     "partial"  . APPENDECTOMY    . BIOPSY  11/17/2017   Procedure: BIOPSY;  Surgeon: Clarene Essex, MD;  Location: [facility] ENDOSCOPY;  Service: Endoscopy;;  . COLONOSCOPY WITH PROPOFOL N/A 11/17/2017   Procedure: COLONOSCOPY WITH PROPOFOL;  Surgeon: Clarene Essex, MD;  Location: [facility] ENDOSCOPY;  Service: Endoscopy;  Laterality: N/A;  . CRANIECTOMY FOR EXCISION OF ACOUSTIC NEUROMA    . ESOPHAGOGASTRODUODENOSCOPY (EGD) WITH PROPOFOL N/A 10/28/2017   Procedure: ESOPHAGOGASTRODUODENOSCOPY (EGD) WITH PROPOFOL;  Surgeon: Clarene Essex, MD;  Location: [facility];  Service: Endoscopy;  Laterality: N/A;  . KNEE ARTHROSCOPY Left   . LEFT HEART CATH AND CORONARY ANGIOGRAPHY N/A 11/24/2016   Procedure: LEFT HEART CATH AND CORONARY ANGIOGRAPHY;  Surgeon: Jettie Booze, MD;  Location: [facility];  Service: Cardiovascular;  Laterality: N/A;  . MELANOMA EXCISION     "off my back"  . NASAL SINUS SURGERY    . TONSILLECTOMY  OB History   No obstetric history on file.      Home Medications    Prior to Admission medications   Medication Sig Start Date End Date Taking? Authorizing Provider  acetaminophen (TYLENOL) 500 MG tablet Take 500 mg by mouth every 6 (six) hours as needed for mild pain.    Yes [provider]  Alpha-Lipoic Acid (LIPOIC ACID PO) Take 600 mg by mouth 2 (two) times daily.    Yes [provider]  atorvastatin (LIPITOR) 10 MG tablet TAKE 1 TABLET BY MOUTH EVERY DAY AT 6PM *NEED OFFICE VISIT* Patient taking differently: Take 10 mg by  mouth daily at 6 PM.  04/18/18  Yes Crenshaw, Denice Bors, MD  Calcium Carbonate-Vitamin D3 600-400 MG-UNIT TABS Take 1 tablet by mouth daily.   Yes [provider]  CALCIUM/MAGNESIUM/ZINC FORMULA PO Take 1 tablet by mouth daily.   Yes [provider]  cetirizine (ZYRTEC) 10 MG tablet Take 10 mg by mouth daily.   Yes [provider]  cholecalciferol (VITAMIN D) 1000 UNITS tablet Take 1,000 Units by mouth daily.   Yes [provider]  CRANBERRY CONCENTRATE PO Take 650 mg by mouth daily.    Yes [provider]  cycloSPORINE (RESTASIS) 0.05 % ophthalmic emulsion Place 1 drop into both eyes 2 (two) times daily.   Yes [provider]  diltiazem (CARDIZEM CD) 120 MG 24 hr capsule Take 1 capsule (120 mg total) by mouth every other day. NEED OV. Patient taking differently: Take 120 mg by mouth every other day.  06/02/18  Yes Patwardhan, Manish J, MD  DULoxetine (CYMBALTA) 30 MG capsule TAKE 1 CAPSULE (30 MG TOTAL) BY MOUTH 2 (TWO) TIMES DAILY. 09/09/17  Yes Sater, Nanine Means, MD  ELIQUIS 5 MG TABS tablet TAKE 1 TABLET BY MOUTH TWICE A DAY Patient taking differently: Take 5 mg by mouth 2 (two) times daily.  06/06/18  Yes Adrian Prows, MD  GLUCOSAMINE-CHONDROITIN PO Take 2 tablets by mouth daily with lunch. Glucosamine 1500mg  Chondroitin 1200mg    Yes [provider]  Lutein-Zeaxanthin 25-5 MG CAPS Take 1 tablet by mouth daily.   Yes [provider]  Melatonin 5 MG TABS Take 5 mg by mouth at bedtime.   Yes [provider]  Multiple Vitamin (MULTIVITAMIN WITH MINERALS) TABS tablet Take 1 tablet by mouth daily.   Yes [provider]  nitrofurantoin (MACRODANTIN) 50 MG capsule Take 50 mg by mouth daily. 06/08/18  Yes [provider]  nitroGLYCERIN (NITROLINGUAL) 0.4 MG/SPRAY spray Place 1 spray under the tongue every 5 (five) minutes x 3 doses as needed for chest pain.   Yes [provider]  Omega-3 1000 MG CAPS Take  1,000 mg by mouth daily.   Yes [provider]  pantoprazole (PROTONIX) 40 MG tablet Take 40 mg by mouth daily before breakfast.  06/12/14  Yes [provider]  simethicone (MYLICON) 80 MG chewable tablet Chew 80 mg by mouth every 6 (six) hours as needed for flatulence.   Yes [provider]  sucralfate (CARAFATE) 1 g tablet Take 1 g by mouth 4 (four) times daily -  with meals and at bedtime.   Yes [provider]  thyroid (ARMOUR) 60 MG tablet Take 60 mg by mouth daily before breakfast. 11/07/17  Yes [provider]  tiZANidine (ZANAFLEX) 4 MG tablet TAKE 1 TABLET BY MOUTH THREE TIMES A DAY AS NEEDED Patient taking differently: Take 2 mg by mouth every 8 (eight) hours as  needed for muscle spasms.  08/04/17  Yes Sater, Nanine Means, MD  vitamin C (ASCORBIC ACID) 500 MG tablet Take 1,000 mg by mouth daily.    Yes [provider]  cephALEXin (KEFLEX) 500 MG capsule Take 1 capsule (500 mg total) by mouth 2 (two) times daily for 5 days. 06/21/18 06/26/18  Ashli Selders, DO  flecainide (TAMBOCOR) 50 MG tablet Take 0.5 tablets (25 mg total) by mouth 2 (two) times daily. Patient not taking: Reported on 06/20/2018 03/18/18   Regalado, Jerald Kief A, MD  losartan (COZAAR) 50 MG tablet Take 1 tablet (50 mg total) by mouth daily. Patient not taking: Reported on 06/20/2018 11/12/17 03/26/26  [name], [name], MD    Family History Family History  Problem Relation Age of Onset  . Congestive Heart Failure Mother   . Heart disease Father        History of heart attacks at a later age.      Social History Social History   Tobacco Use  . Smoking status: Former Smoker    Years: 2.00    Types: Cigarettes    Last attempt to quit: 1980    Years since quitting: 40.2  . Smokeless tobacco: Never Used  . Tobacco comment: 11/10/2017 "only smoked when I was on my period"  Substance Use Topics  . Alcohol use: Yes    Comment: 11/10/2017 "nothing lately; did have 1 glass  of wine q night"  . Drug use: Never     Allergies   Alendronate sodium and Avelox  [moxifloxacin hcl in nacl]   Review of Systems Review of Systems  Constitutional: Negative for chills and fever.  HENT: Negative for ear pain and sore throat.   Eyes: Negative for pain and visual disturbance.  Respiratory: Negative for cough and shortness of breath.   Cardiovascular: Positive for near-syncope (recent hx of lightheadness and postural dizziness ). Negative for chest pain and palpitations.  Gastrointestinal: Negative for abdominal pain and vomiting.  Genitourinary: Negative for dysuria and hematuria.  Musculoskeletal: Negative for arthralgias and back pain.  Skin: Negative for color change and rash.  Neurological: Positive for dizziness and weakness. Negative for seizures, loss of consciousness and syncope.  All other systems reviewed and are negative.    Physical Exam Updated Vital Signs  ED Triage Vitals  Enc Vitals Group     BP 06/20/18 2046 127/76     Pulse Rate 06/20/18 2046 (!) 135     Resp 06/20/18 2046 18     Temp 06/20/18 2046 98.2 F (36.8 C)     Temp Source 06/20/18 2046 Oral     SpO2 06/20/18 2043 96 %     Weight 06/20/18 2048 154 lb 5.2 oz (70 kg)     Height 06/20/18 2048 5\' 5"  (1.651 m)     Head Circumference --      Peak Flow --      Pain Score 06/20/18 2048 0     Pain Loc --      Pain Edu? --      Excl. in [place]? --    \ Physical Exam Vitals signs and nursing note reviewed.  Constitutional:      General: She is not in acute distress.    Appearance: She is well-developed. She is not ill-appearing.  HENT:     Head: Normocephalic and atraumatic.     Nose: Nose normal.  Eyes:     Extraocular Movements: Extraocular movements intact.     Conjunctiva/sclera: Conjunctivae normal.  Pupils: Pupils are equal, round, and reactive to light.  Neck:     Musculoskeletal: Neck supple.  Cardiovascular:     Rate and Rhythm: Regular rhythm. Tachycardia present.      Heart sounds: No murmur.  Pulmonary:     Effort: Pulmonary effort is normal. No respiratory distress.     Breath sounds: Normal breath sounds.  Abdominal:     General: There is no distension.     Palpations: Abdomen is soft.     Tenderness: There is no abdominal tenderness.  Musculoskeletal:     Right lower leg: No edema.     Left lower leg: No edema.  Skin:    General: Skin is warm and dry.     Capillary Refill: Capillary refill takes less than 2 seconds.  Neurological:     General: No focal deficit present.     Mental Status: She is alert and oriented to person, place, and time.     Cranial Nerves: No cranial nerve deficit.     Sensory: No sensory deficit.     Motor: No weakness.     Coordination: Coordination normal.     Gait: Gait normal.     Comments: 5+ out of 5 strength, normal sensation, no drift, normal finger-to-nose finger  Psychiatric:        Mood and Affect: Mood normal.      ED Treatments / Results  Labs (all labs ordered are listed, but only abnormal results are displayed) Labs Reviewed  CBC WITH DIFFERENTIAL/PLATELET - Abnormal; Notable for the following components:      Result Value   WBC 12.3 (*)    Neutro Abs 9.4 (*)    Monocytes Absolute 1.1 (*)    All other components within normal limits  BASIC METABOLIC PANEL - Abnormal; Notable for the following components:   Potassium 2.8 (*)    Glucose, Bld 134 (*)    Creatinine, Ser 1.01 (*)    GFR calc non Af Amer 54 (*)    All other components within normal limits  URINALYSIS, ROUTINE W REFLEX MICROSCOPIC - Abnormal; Notable for the following components:   APPearance HAZY (*)    Leukocytes,Ua LARGE (*)    Bacteria, UA RARE (*)    All other components within normal limits  URINE CULTURE  MAGNESIUM    EKG EKG Interpretation  Date/Time:  Monday June 20 2018 21:10:14 EDT Ventricular Rate:  103 PR Interval:    QRS Duration: 102 QT Interval:  365 QTC Calculation: 478 R Axis:   -71 Text  Interpretation:  Sinus tachycardia Atrial premature complex Incomplete RBBB and LAFB RSR' in V1 or V2, right VCD or RVH Confirmed by Lennice Sites [id number]) on 06/20/2018 9:15:35 PM   Radiology Dg Chest 2 View  Result Date: 06/20/2018 CLINICAL DATA:  Weakness. EXAM: CHEST - 2 VIEW COMPARISON:  Radiographs of March 17, 2018. FINDINGS: The heart size and mediastinal contours are within normal limits. No pneumothorax or pleural effusion is noted. Minimal bibasilar subsegmental atelectasis is noted. The visualized skeletal structures are unremarkable. IMPRESSION: Minimal bibasilar subsegmental atelectasis. Electronically Signed   By: Marijo Conception, M.D.   On: 06/20/2018 21:44    Procedures Procedures (including critical care time)  Medications Ordered in ED Medications  diltiazem (CARDIZEM CD) 24 hr capsule 120 mg (has no administration in time range)  sodium chloride 0.9 % bolus 1,000 mL (1,000 mLs Intravenous New Bag/Given 06/20/18 2200)     Initial Impression / Assessment and Plan /  ED Course  I have reviewed the triage vital signs and the nursing notes.  Pertinent labs & imaging results that were available during my care of the patient were reviewed by me and considered in my medical decision making (see chart for details).     Cynthia [name] is a 78 year old female with history of atrial fibrillation on blood thinner who presents the ED with episode of weakness, dizziness.  Patient arrives to the ED tachycardic but otherwise with normal vitals.  No fever.  Patient states that while she was watching TV she felt like she got dizzy and weak and could not stand up.  She states that this is happened multiple times in the past.  Has history of chronic UTIs.  Just finished a course of antibiotics.  Patient with EKG that shows atrial tachycardia.  Patient has no chest pain, no shortness of breath.  Overall is asymptomatic now.  Normal neurological exam.  Suspect that symptoms likely secondary to  tachycardia.  It appears that she has a history of orthostatic hypotension, tachycardia.  She is on Cardizem every other day.  Her last dose is due now.  Patient given normal saline bolus.  Suspect possible tachycardia causing her symptoms today.  However will check basic labs, urinalysis.  Normal neurological exam.  No concern for stroke or intracranial process.  Cardiology was consulted to evaluate patient's rhythm strip.  Patient with no significant anemia, electrolyte abnormality, kidney injury.  Equivocal urinalysis and will treat with Keflex.  Urine culture sent.  Chest x-ray showed no obvious pneumonia, pneumothorax, pleural effusion.  Overall heart rate has improved without any medications to the 90s.  When repeat EKG is performed it appears that patient has sinus rhythm.  Possibly has some underlying atrial tachycardia which is already well known.  Patient was given her home dose of Cardizem and recommend close follow-up with cardiology to see if there needs to be any medication changes.  Cardiology fellow states that patient would probably benefit from being on metoprolol as he feels that she is likely having episodes of atrial tachycardia.  However, patient remained hemodynamically stable throughout my care.  No episodes of tachycardia other than on arrival.  Understands return precautions and recommended follow-up with cardiology.  Discharged in good condition.  This chart was dictated using voice recognition software.  Despite best efforts to proofread,  errors can occur which can change the documentation meaning.    Final Clinical Impressions(s) / ED Diagnoses   Final diagnoses:  Urinary tract infection without hematuria, site unspecified    ED Discharge Orders         Ordered    cephALEXin (KEFLEX) 500 MG capsule  2 times daily     06/21/18 0036           Lennice Sites, DO 06/21/18 0103

## 2018-06-20 NOTE — ED Triage Notes (Signed)
BIB [facility] fro home with c/o of increased weakness starting about an hour ago. Pt states she was watching tv and felt that she couldn't get up and walk around. States she had chronic UTIs and finished abx for current UTI yesterday. Pt in Afib and taking Eliquis.

## 2018-06-21 ENCOUNTER — Ambulatory Visit (INDEPENDENT_AMBULATORY_CARE_PROVIDER_SITE_OTHER): Payer: Medicare PPO | Admitting: Cardiology

## 2018-06-21 ENCOUNTER — Encounter: Payer: Self-pay | Admitting: Cardiology

## 2018-06-21 VITALS — BP 118/71 | HR 79 | Ht 65.0 in | Wt 156.0 lb

## 2018-06-21 DIAGNOSIS — R079 Chest pain, unspecified: Secondary | ICD-10-CM

## 2018-06-21 DIAGNOSIS — I471 Supraventricular tachycardia: Secondary | ICD-10-CM

## 2018-06-21 DIAGNOSIS — I480 Paroxysmal atrial fibrillation: Secondary | ICD-10-CM

## 2018-06-21 DIAGNOSIS — R42 Dizziness and giddiness: Secondary | ICD-10-CM | POA: Diagnosis not present

## 2018-06-21 DIAGNOSIS — N3000 Acute cystitis without hematuria: Secondary | ICD-10-CM

## 2018-06-21 LAB — URINALYSIS, ROUTINE W REFLEX MICROSCOPIC
Bilirubin Urine: NEGATIVE
Glucose, UA: NEGATIVE mg/dL
HGB URINE DIPSTICK: NEGATIVE
Ketones, ur: NEGATIVE mg/dL
Nitrite: NEGATIVE
Protein, ur: NEGATIVE mg/dL
Specific Gravity, Urine: 1.013 (ref 1.005–1.030)
pH: 6 (ref 5.0–8.0)

## 2018-06-21 MED ORDER — CEPHALEXIN 500 MG PO CAPS: 500.0000 mg | ORAL_CAPSULE | Freq: Two times a day (BID) | ORAL | 0 refills | Status: AC

## 2018-06-21 MED ORDER — DILTIAZEM HCL ER COATED BEADS 120 MG PO CP24
120.0000 mg | ORAL_CAPSULE | Freq: Once | ORAL | Status: AC
  Administered 2018-06-21: 120 mg via ORAL
  Filled 2018-06-21: qty 1

## 2018-06-21 MED ORDER — DILTIAZEM HCL ER COATED BEADS 120 MG PO CP24: 120.0000 mg | ORAL_CAPSULE | ORAL | Status: DC

## 2018-06-21 NOTE — Discharge Instructions (Addendum)
Follow-up with Dr. Einar Gip this week to talk about episodes of tachycardia.  Continue to take your medications as prescribed.  Continue antibiotic for urinary tract infection.  Return to the ED if your symptoms worsen.

## 2018-06-21 NOTE — ED Notes (Signed)
Patient verbalizes understanding of discharge instructions. Opportunity for questioning and answers were provided. Armband removed by staff, pt discharged from ED in wheelchair.  

## 2018-06-21 NOTE — Progress Notes (Signed)
Patient is here for follow up visit.  Subjective:   @Patient  ID: Cynthia [name], female    DOB: 05/08/1940, 78 y.o.   MRN: [id number]  Chief Complaint  Patient presents with  . Tachycardia    CHEST PRESSURE   . Chest Pain    HPI   78 year old Caucasian female with hypertension, hyperlipidemia, paroxysmal atrial fibrillation, obstructive sleep apnea on CPAP.  Patient was seen in the emergency room on 06/20/2018, yesterday with episode of weakness and dizziness.  Noted to have atrial tachycardia on EKG.  She has been taking Cardizem every other day.  She was given IV fluids.  Was found to have UTI and was given prescription for Keflex.  She was encouraged to follow-up with [place] in the outpatient setting given her episodes of atrial tachycardia.  She now presents for hospital follow-up. Today she feels well. Prior to Presentation to the emergency room, patient states that she felt poorly and was having frequency of urination which is now improved.  One hour prior to presentation she fell tachycardia, not feeling well.  Her last office visit on 05/01/2018, I had discontinued flecainide due to lightheadedness and dizziness and this seems to have improved.  She was not orthostatic on her last office visit.  Past Medical History:  Diagnosis Date  . A-fib ([place])   . Anemia   . Arthritis    "joints" (11/10/2017)  . Atrial fibrillation ([place])   . Dizziness   . GERD (gastroesophageal reflux disease)   . Hearing loss   . History of blood transfusion 10/2017  . Hyperlipidemia   . Hypertension   . Hyperthyroidism   . Melanoma ([place])    "cut off my back"  . Memory loss   . OSA on CPAP   . Sinus headache   . Sleep apnea     Past Surgical History:  Procedure Laterality Date  . ABDOMINAL HYSTERECTOMY     "partial"  . APPENDECTOMY    . BIOPSY  11/17/2017   Procedure: BIOPSY;  Surgeon: Clarene Essex, MD;  Location: [facility];  Service: Endoscopy;;  . COLONOSCOPY WITH PROPOFOL N/A 11/17/2017   Procedure: COLONOSCOPY WITH PROPOFOL;  Surgeon: Clarene Essex, MD;  Location: [facility];  Service: Endoscopy;  Laterality: N/A;  . CRANIECTOMY FOR EXCISION OF ACOUSTIC NEUROMA    . ESOPHAGOGASTRODUODENOSCOPY (EGD) WITH PROPOFOL N/A 10/28/2017   Procedure: ESOPHAGOGASTRODUODENOSCOPY (EGD) WITH PROPOFOL;  Surgeon: Clarene Essex, MD;  Location: [facility];  Service: Endoscopy;  Laterality: N/A;  . KNEE ARTHROSCOPY Left   . LEFT HEART CATH AND CORONARY ANGIOGRAPHY N/A 11/24/2016   Procedure: LEFT HEART CATH AND CORONARY ANGIOGRAPHY;  Surgeon: Jettie Booze, MD;  Location: [facility];  Service: Cardiovascular;  Laterality: N/A;  . MELANOMA EXCISION     "off my back"  . NASAL SINUS SURGERY    . TONSILLECTOMY      Social History   Socioeconomic History  . Marital status: Married    Spouse name: Not on file  . Number of children: 2  . Years of education: Not on file  . Highest education level: Not on file  Occupational History  . Not on file  Social Needs  . Financial resource strain: Not on file  . Food insecurity:    Worry: Not on file    Inability: Not on file  . Transportation needs:    Medical: Not on file    Non-medical: Not on file  Tobacco Use  . Smoking status:  Former Smoker    Years: 2.00    Types: Cigarettes    Last attempt to quit: 1980    Years since quitting: 40.2  . Smokeless tobacco: Never Used  . Tobacco comment: 11/10/2017 "only smoked when I was on my period"  Substance and Sexual Activity  . Alcohol use: Yes    Comment: 11/10/2017 "nothing lately; did have 1 glass of wine q night"  . Drug use: Never  . Sexual activity: Not Currently  Lifestyle  . Physical activity:    Days per week: Not on file    Minutes per session: Not on file  . Stress: Not on file  Relationships  . Social connections:    Talks on phone: Not on file    Gets together: Not on file    Attends religious service: Not on file    Active member of club or organization: Not on  file    Attends meetings of clubs or organizations: Not on file    Relationship status: Not on file  . Intimate partner violence:    Fear of current or ex partner: Not on file    Emotionally abused: Not on file    Physically abused: Not on file    Forced sexual activity: Not on file  Other Topics Concern  . Not on file  Social History Narrative   Right handed     Current Outpatient Medications on File Prior to Visit  Medication Sig Dispense Refill  . acetaminophen (TYLENOL) 500 MG tablet Take 500 mg by mouth every 6 (six) hours as needed for mild pain.     . Alpha-Lipoic Acid (LIPOIC ACID PO) Take 600 mg by mouth 2 (two) times daily.     Marland Kitchen atorvastatin (LIPITOR) 10 MG tablet TAKE 1 TABLET BY MOUTH EVERY DAY AT 6PM *NEED OFFICE VISIT* (Patient taking differently: Take 10 mg by mouth daily at 6 PM. ) 15 tablet 0  . Calcium Carbonate-Vitamin D3 600-400 MG-UNIT TABS Take 1 tablet by mouth daily.    Marland Kitchen CALCIUM/MAGNESIUM/ZINC FORMULA PO Take 1 tablet by mouth daily.    . cephALEXin (KEFLEX) 500 MG capsule Take 1 capsule (500 mg total) by mouth 2 (two) times daily for 5 days. 10 capsule 0  . cholecalciferol (VITAMIN D) 1000 UNITS tablet Take 1,000 Units by mouth daily.    Marland Kitchen CRANBERRY CONCENTRATE PO Take 650 mg by mouth daily.     . cycloSPORINE (RESTASIS) 0.05 % ophthalmic emulsion Place 1 drop into both eyes as needed.     . diltiazem (CARDIZEM CD) 120 MG 24 hr capsule Take 1 capsule (120 mg total) by mouth every other day. NEED OV. (Patient taking differently: Take 120 mg by mouth every other day. ) 30 capsule 3  . DULoxetine (CYMBALTA) 30 MG capsule TAKE 1 CAPSULE (30 MG TOTAL) BY MOUTH 2 (TWO) TIMES DAILY. 180 capsule 2  . ELIQUIS 5 MG TABS tablet TAKE 1 TABLET BY MOUTH TWICE A DAY (Patient taking differently: Take 5 mg by mouth 2 (two) times daily. ) 60 tablet 1  . GLUCOSAMINE-CHONDROITIN PO Take 2 tablets by mouth daily with lunch. Glucosamine 1500mg  Chondroitin 1200mg     .  Lutein-Zeaxanthin 25-5 MG CAPS Take 1 tablet by mouth daily.    . Melatonin 5 MG TABS Take 5 mg by mouth at bedtime.    . Multiple Vitamin (MULTIVITAMIN WITH MINERALS) TABS tablet Take 1 tablet by mouth daily.    . Omega-3 1000 MG CAPS Take 1,000 mg by  mouth daily.    . pantoprazole (PROTONIX) 40 MG tablet Take 40 mg by mouth daily before breakfast.   1  . simethicone (MYLICON) 80 MG chewable tablet Chew 80 mg by mouth every 6 (six) hours as needed for flatulence.    . sucralfate (CARAFATE) 1 g tablet Take 1 g by mouth 4 (four) times daily -  with meals and at bedtime.    Marland Kitchen thyroid (ARMOUR) 60 MG tablet Take 60 mg by mouth daily before breakfast.    . tiZANidine (ZANAFLEX) 4 MG tablet TAKE 1 TABLET BY MOUTH THREE TIMES A DAY AS NEEDED (Patient taking differently: Take 2 mg by mouth every 8 (eight) hours as needed for muscle spasms. ) 90 tablet 5  . vitamin C (ASCORBIC ACID) 500 MG tablet Take 1,000 mg by mouth daily.     . nitrofurantoin (MACRODANTIN) 50 MG capsule Take 50 mg by mouth as needed.     . nitroGLYCERIN (NITROLINGUAL) 0.4 MG/SPRAY spray Place 1 spray under the tongue every 5 (five) minutes x 3 doses as needed for chest pain.     No current facility-administered medications on file prior to visit.     Cardiovascular studies:  Carotid duplex 03/27/2018:  Right Carotid: Velocities in the right ICA are consistent with a 1-39% stenosis. Left Carotid: Velocities in the left ICA are consistent with a 1-39% stenosis. Vertebrals:  Bilateral vertebral arteries demonstrate antegrade flow. Subclavians: Normal flow hemodynamics were seen in bilateral subclavian              arteries.  Event Monitor 30 days 11/11/2017: Predominant rhythm is normal sinus rhythm. Symptoms of fatigue reveals normal sinus rhythm. Asymptomatic atrial fibrillation and probable atypical atrial flutter noted on 11/13/2017, 11/18/2017 and 12/09/2017 at 3:00 AM. Occasional PACs. Arrhythmia/PVC burden 6%.  Echo 11/09/2017:  Left ventricle: The cavity size was normal. Systolic function was   normal. The estimated ejection fraction was in the range of 60%   to 65%. Wall motion was normal; there were no regional wall   motion abnormalities. The study is not technically sufficient to   allow evaluation of LV diastolic function.Pulmonary arteries: PA peak pressure: 32 mm Hg (S).  Cath 11/2016: Normal coronaries; tortuous LAD  Review of Systems  Constitution: Positive for malaise/fatigue. Negative for decreased appetite, weight gain and weight loss.  HENT: Negative for congestion.   Eyes: Negative for visual disturbance.  Cardiovascular: Negative for chest pain, dyspnea on exertion, leg swelling, palpitations and syncope.  Respiratory: Negative for shortness of breath.   Endocrine: Negative for cold intolerance.  Hematologic/Lymphatic: Does not bruise/bleed easily.  Skin: Negative for itching and rash.  Musculoskeletal: Negative for myalgias.  Gastrointestinal: Negative for abdominal pain, nausea and vomiting.  Genitourinary: Negative for dysuria.  Neurological: Positive for light-headedness. Negative for dizziness and weakness.  Psychiatric/Behavioral: The patient is not nervous/anxious.   All other systems reviewed and are negative.      Objective:   Vitals:   06/21/18 1434  BP: 118/71  Pulse: 79     Physical Exam  Constitutional: She is oriented to person, place, and time. She appears well-developed and well-nourished. No distress.  HENT:  Head: Normocephalic and atraumatic.  Eyes: Pupils are equal, round, and reactive to light. Conjunctivae are normal.  Neck: No JVD present.  Cardiovascular: Normal rate, regular rhythm and intact distal pulses.  No murmur heard. Pulmonary/Chest: Effort normal and breath sounds normal. She has no wheezes. She has no rales.  Abdominal: Soft. Bowel sounds are  normal. There is no rebound.  Musculoskeletal:        General: No edema.  Lymphadenopathy:    She has  no cervical adenopathy.  Neurological: She is alert and oriented to person, place, and time. No cranial nerve deficit.  Skin: Skin is warm and dry.  Psychiatric: She has a normal mood and affect.  Nursing note and vitals reviewed.     Assessment & Recommendations:   78 year old Caucasian female with hypertension, hyperlipidemia, paroxysmal atrial fibrillation, obstructive sleep apnea on CPAP.  1. Lightheadedness   2. Paroxysmal atrial fibrillation (HCC) EKG 06/20/2018: Atrial tachycardia at the rate of 133 bpm.  Left axis deviation, left anterior fascicular block.  Poor R-wave progression.  Incomplete right bundle branch block.  EKG 06/21/2018: Normal sinus rhythm at the rate of 75 bpm, left axis deviation, incomplete right bundle branch block.  Poor R-wave progression.  Nonspecific T abnormality.  CHA2DS2VASc score 3. Annual stroke risk 3%.  3. OSA on CPAP  Continue PSA.   4. Generalized fatigue/UTI:  Recommendation: Patient's symptoms of fatigue,Dyspnea and palpitations and chest pain she presented to the emergency room was related to severe electrolyte imbalance associated with severe UTI and symptoms of frequency that started a day prior to presentation.  She is asymptomatic today.  On her last office visit I discontinued flecainide due to frequent dizziness and fatigue, and this seems to have improved as well.  She has been compliant with CPAP.  I reviewed the EKG and labs from the hospital and updated them.  There is no clinical evidence of congestive heart failure.  She is tolerating anticoagulation without bleeding diathesis.  Clearly has atrial fibrillation noted on the event monitor previously.  I'll like to see her back in 6 months for follow-up.  Advised her to increase fluid intake and also salt intake, Pedialyte would be appropriate for her.  Potassium levels were very low when she presented, increase potassium intake and foods rich in potassium discussed.   Adrian Prows,  MD, [facility] 06/22/2018, 5:40 AM [facility]. [place] Pager: [phone] Office: [phone] If no answer Cell [phone]

## 2018-06-22 ENCOUNTER — Telehealth: Payer: Self-pay

## 2018-06-22 LAB — URINE CULTURE: Culture: >=100000 — AB

## 2018-06-22 NOTE — Telephone Encounter (Signed)
Generally taken every 12 hours, so that should not be the issue.

## 2018-06-22 NOTE — Telephone Encounter (Signed)
Yes her symptoms are felt to be related to dehydration and potassium levels being low. May take a few days for symptoms to improve. Make sure she is drinking pedialyte and eating foods rich in potassium along with taking potassium supplement. And let us know if continues.

## 2018-06-22 NOTE — Telephone Encounter (Signed)
Pt just called and stated she took her cephalexin 250 mg last night 250mg  this morning she think she may have taken too much

## 2018-06-22 NOTE — Telephone Encounter (Signed)
Pt called c/o being lightheaded, room spinning; NO chest pain or leg swelling or sob ; She says that the only new medication is that she has added some potassium;

## 2018-06-23 ENCOUNTER — Telehealth: Payer: Self-pay

## 2018-06-23 DIAGNOSIS — E876 Hypokalemia: Secondary | ICD-10-CM | POA: Diagnosis not present

## 2018-06-23 DIAGNOSIS — I10 Essential (primary) hypertension: Secondary | ICD-10-CM | POA: Diagnosis not present

## 2018-06-23 DIAGNOSIS — N390 Urinary tract infection, site not specified: Secondary | ICD-10-CM | POA: Diagnosis not present

## 2018-06-23 NOTE — Telephone Encounter (Signed)
Post ED Visit - Positive Culture Follow-up  Culture report reviewed by antimicrobial stewardship pharmacist: [place] Team []  Elenor Quinones, Pharm.D. []  Heide Guile, Pharm.D., BCPS AQ-ID []  Parks Neptune, Pharm.D., BCPS []  Alycia Rossetti, Pharm.D., BCPS []  [place], Pharm.D., BCPS, AAHIVP []  Legrand Como, Pharm.D., BCPS, AAHIVP []  Salome Arnt, PharmD, BCPS []  Johnnette Gourd, PharmD, BCPS [x]  Hughes Better, PharmD, BCPS []  Leeroy Cha, PharmD []  Laqueta Linden, PharmD, BCPS []  Albertina Parr, PharmD  [place] Team []  Leodis Sias, PharmD []  Lindell Spar, PharmD []  Royetta Asal, PharmD []  Graylin Shiver, Rph []  Rema Fendt) Glennon Mac, PharmD []  Arlyn Dunning, PharmD []  Netta Cedars, PharmD []  Dia Sitter, PharmD []  Leone Haven, PharmD []  Gretta Arab, PharmD []  Theodis Shove, PharmD []  Peggyann Juba, PharmD []  Reuel Boom, PharmD   Positive urine culture Treated with Cephalexin and Nitrofurantoin, organism sensitive to the same and no further patient follow-up is required at this time.  Genia Del 06/23/2018, 4:10 PM

## 2018-06-25 ENCOUNTER — Other Ambulatory Visit: Payer: Self-pay | Admitting: Cardiology

## 2018-06-25 DIAGNOSIS — E782 Mixed hyperlipidemia: Secondary | ICD-10-CM

## 2018-06-26 ENCOUNTER — Other Ambulatory Visit: Payer: Self-pay | Admitting: Cardiology

## 2018-06-28 ENCOUNTER — Telehealth: Payer: Self-pay

## 2018-06-28 ENCOUNTER — Other Ambulatory Visit: Payer: Self-pay | Admitting: Internal Medicine

## 2018-06-28 DIAGNOSIS — I4891 Unspecified atrial fibrillation: Secondary | ICD-10-CM

## 2018-06-28 NOTE — Telephone Encounter (Signed)
Please disregard msg  

## 2018-06-28 NOTE — Telephone Encounter (Signed)
Pt aware of instructions per AK.//ah

## 2018-06-28 NOTE — Telephone Encounter (Signed)
She needs to push fluids like pedialyte and water. Symptoms are related to her low blood pressure. If no improvement needs to go to ER. Not sure why her pressure has dropped so low unless related to UTI.

## 2018-06-28 NOTE — Telephone Encounter (Signed)
Pt husband called pt wife bp is 75/48; She feels "woozy" she feels like her balance is off; This has started today, Please advise and call pt today or send back to another MA .  Thank you

## 2018-06-30 DIAGNOSIS — I10 Essential (primary) hypertension: Secondary | ICD-10-CM | POA: Diagnosis not present

## 2018-06-30 DIAGNOSIS — I480 Paroxysmal atrial fibrillation: Secondary | ICD-10-CM | POA: Diagnosis not present

## 2018-06-30 DIAGNOSIS — N390 Urinary tract infection, site not specified: Secondary | ICD-10-CM | POA: Diagnosis not present

## 2018-06-30 DIAGNOSIS — Z7901 Long term (current) use of anticoagulants: Secondary | ICD-10-CM | POA: Diagnosis not present

## 2018-07-01 DIAGNOSIS — R351 Nocturia: Secondary | ICD-10-CM | POA: Diagnosis not present

## 2018-07-01 DIAGNOSIS — N8110 Cystocele, unspecified: Secondary | ICD-10-CM | POA: Diagnosis not present

## 2018-07-01 DIAGNOSIS — N390 Urinary tract infection, site not specified: Secondary | ICD-10-CM | POA: Diagnosis not present

## 2018-07-01 DIAGNOSIS — R399 Unspecified symptoms and signs involving the genitourinary system: Secondary | ICD-10-CM | POA: Diagnosis not present

## 2018-07-01 DIAGNOSIS — R339 Retention of urine, unspecified: Secondary | ICD-10-CM | POA: Diagnosis not present

## 2018-07-01 DIAGNOSIS — Z7901 Long term (current) use of anticoagulants: Secondary | ICD-10-CM | POA: Diagnosis not present

## 2018-07-01 DIAGNOSIS — I4891 Unspecified atrial fibrillation: Secondary | ICD-10-CM | POA: Diagnosis not present

## 2018-07-01 DIAGNOSIS — N3946 Mixed incontinence: Secondary | ICD-10-CM | POA: Diagnosis not present

## 2018-07-01 DIAGNOSIS — N8111 Cystocele, midline: Secondary | ICD-10-CM | POA: Diagnosis not present

## 2018-07-06 DIAGNOSIS — R55 Syncope and collapse: Secondary | ICD-10-CM | POA: Diagnosis not present

## 2018-07-08 DIAGNOSIS — R358 Other polyuria: Secondary | ICD-10-CM | POA: Diagnosis not present

## 2018-07-08 DIAGNOSIS — N390 Urinary tract infection, site not specified: Secondary | ICD-10-CM | POA: Diagnosis not present

## 2018-07-12 ENCOUNTER — Telehealth: Payer: Self-pay

## 2018-07-12 NOTE — Telephone Encounter (Signed)
Pt's spouse Jearld Fenton aware. Pt was taking a nap.//ah

## 2018-07-12 NOTE — Telephone Encounter (Signed)
Pt called to advise of episode she had this morning of Angina/chest heaviness. She used nitro spray w/ relief but now has a headache. She is currently wearing a 30 day monitor for syncope. JG also advised her to take Pedialyte but she wants to know how much she should take.

## 2018-07-14 DIAGNOSIS — I10 Essential (primary) hypertension: Secondary | ICD-10-CM | POA: Diagnosis not present

## 2018-07-14 DIAGNOSIS — I480 Paroxysmal atrial fibrillation: Secondary | ICD-10-CM | POA: Diagnosis not present

## 2018-07-19 DIAGNOSIS — G4733 Obstructive sleep apnea (adult) (pediatric): Secondary | ICD-10-CM | POA: Diagnosis not present

## 2018-07-19 DIAGNOSIS — N8111 Cystocele, midline: Secondary | ICD-10-CM | POA: Diagnosis not present

## 2018-07-19 DIAGNOSIS — R351 Nocturia: Secondary | ICD-10-CM | POA: Diagnosis not present

## 2018-07-19 DIAGNOSIS — R339 Retention of urine, unspecified: Secondary | ICD-10-CM | POA: Diagnosis not present

## 2018-07-19 DIAGNOSIS — Z7901 Long term (current) use of anticoagulants: Secondary | ICD-10-CM | POA: Diagnosis not present

## 2018-07-25 ENCOUNTER — Telehealth: Payer: Self-pay

## 2018-07-25 NOTE — Telephone Encounter (Signed)
As discussed before, she may be developing recurrent UTI. There have been no alerts on Event monitor she is wearing, which we will confirm tomorrow. Hydration and observe

## 2018-07-25 NOTE — Telephone Encounter (Signed)
Pt s husband called and wanted  You to know her puilse was 112 and her BP was 76/44. , pt is wearing 30 days monitor

## 2018-07-26 ENCOUNTER — Telehealth: Payer: Self-pay

## 2018-07-26 NOTE — Telephone Encounter (Signed)
I spoke to the patient and her husband. Advised them to make sure pt is hydrated and that she is wearing a monitor and we have gotten no alerts.

## 2018-08-04 ENCOUNTER — Encounter: Payer: Self-pay | Admitting: Cardiology

## 2018-08-04 ENCOUNTER — Other Ambulatory Visit: Payer: Self-pay

## 2018-08-04 ENCOUNTER — Ambulatory Visit (INDEPENDENT_AMBULATORY_CARE_PROVIDER_SITE_OTHER): Payer: Medicare PPO | Admitting: Cardiology

## 2018-08-04 VITALS — BP 152/100 | HR 79 | Ht 65.0 in | Wt 155.0 lb

## 2018-08-04 DIAGNOSIS — R55 Syncope and collapse: Secondary | ICD-10-CM | POA: Diagnosis not present

## 2018-08-04 DIAGNOSIS — G4733 Obstructive sleep apnea (adult) (pediatric): Secondary | ICD-10-CM

## 2018-08-04 DIAGNOSIS — R358 Other polyuria: Secondary | ICD-10-CM | POA: Diagnosis not present

## 2018-08-04 DIAGNOSIS — R42 Dizziness and giddiness: Secondary | ICD-10-CM

## 2018-08-04 DIAGNOSIS — L97522 Non-pressure chronic ulcer of other part of left foot with fat layer exposed: Secondary | ICD-10-CM | POA: Diagnosis not present

## 2018-08-04 DIAGNOSIS — I10 Essential (primary) hypertension: Secondary | ICD-10-CM | POA: Diagnosis not present

## 2018-08-04 DIAGNOSIS — E039 Hypothyroidism, unspecified: Secondary | ICD-10-CM | POA: Diagnosis not present

## 2018-08-04 DIAGNOSIS — I480 Paroxysmal atrial fibrillation: Secondary | ICD-10-CM | POA: Diagnosis not present

## 2018-08-04 DIAGNOSIS — N390 Urinary tract infection, site not specified: Secondary | ICD-10-CM | POA: Diagnosis not present

## 2018-08-04 NOTE — Progress Notes (Signed)
Virtual Visit via Video Note: This visit type was conducted due to national recommendations for restrictions regarding the COVID-19 Pandemic (e.g. social distancing).  This format is felt to be most appropriate for this patient at this time.  All issues noted in this document were discussed and addressed.  No physical exam was performed (except for noted visual exam findings with Telehealth visits).  The patient has consented to conduct a Telehealth visit and understands insurance will be billed.   I connected with@, on 08/04/18 at  by a video enabled telemedicine application and verified that I am speaking with the correct person using two identifiers.   I discussed the limitations of evaluation and management by telemedicine and the availability of in person appointments. The patient expressed understanding and agreed to proceed.   I have discussed with patient regarding the safety during COVID Pandemic and steps and precautions to be taken including social distancing, frequent hand wash and use of detergent soap, gels with the patient. I asked the patient to avoid touching mouth, nose, eyes, ears with the hands. I encouraged regular walking around the neighborhood and exercise and regular diet, as long as social distancing can be maintained.   Primary Physician/Referring:  Deland Pretty, MD  Patient ID: Tacey Heap, female    DOB: 1941/02/19, 78 y.o.   MRN: [id number]  Chief Complaint  Patient presents with  . Chest Pain    6 month f/u     HPI: Eartha Vonbehren  is a 78 y.o. female  with 78 year old Caucasian female with hypertension, hyperlipidemia, frequent UTI,  paroxysmal atrial fibrillation, obstructive sleep apnea on CPAP.   Patient was seen in the emergency room on 06/20/2018, yesterday with episode of weakness and dizziness.  Noted to have atrial tachycardia on EKG.  She has been taking Cardizem every other day.  She was given IV fluids.  Was found to have UTI and was given  prescription for Keflex.  She was encouraged to follow-up with [place] in the outpatient setting given her episodes of atrial tachycardia.    She now presents for hospital follow-up.  Today she feels well. Prior to Presentation to the emergency room, patient states that she felt poorly and was having frequency of urination which is now improved.  One hour prior to presentation she fell tachycardia, not feeling well.  Her last office visit on 05/01/2018, I had discontinued flecainide due to lightheadedness and dizziness and this seems to have improved.  She was not orthostatic on her last office visit.    Past Medical History:  Diagnosis Date  . Acute blood loss anemia 10/27/2017  . Acute GI bleeding 10/27/2017  . Anemia   . Arthritis    "joints" (11/10/2017)  . Dizziness   . GERD (gastroesophageal reflux disease)   . GI bleed 11/06/2017  . Hearing loss   . History of blood transfusion 10/2017  . Hyperlipidemia   . Hyperthyroidism   . Melanoma ([place])    "cut off my back"  . Memory loss   . Sinus headache   . Sleep apnea     Past Surgical History:  Procedure Laterality Date  . ABDOMINAL HYSTERECTOMY     "partial"  . APPENDECTOMY    . BIOPSY  11/17/2017   Procedure: BIOPSY;  Surgeon: Clarene Essex, MD;  Location: [facility];  Service: Endoscopy;;  . COLONOSCOPY WITH PROPOFOL N/A 11/17/2017   Procedure: COLONOSCOPY WITH PROPOFOL;  Surgeon: Clarene Essex, MD;  Location: [facility];  Service: Endoscopy;  Laterality: N/A;  . CRANIECTOMY FOR EXCISION OF ACOUSTIC NEUROMA    . ESOPHAGOGASTRODUODENOSCOPY (EGD) WITH PROPOFOL N/A 10/28/2017   Procedure: ESOPHAGOGASTRODUODENOSCOPY (EGD) WITH PROPOFOL;  Surgeon: Clarene Essex, MD;  [facility];  Service: Endoscopy;  Laterality: N/A;  . KNEE ARTHROSCOPY Left   . LEFT HEART CATH AND CORONARY ANGIOGRAPHY N/A 11/24/2016   Procedure: LEFT HEART CATH AND CORONARY ANGIOGRAPHY;  Surgeon: Jettie Booze, MD;  Location: [facility];  Service:  Cardiovascular;  Laterality: N/A;  . MELANOMA EXCISION     "off my back"  . NASAL SINUS SURGERY    . TONSILLECTOMY      Social History   Socioeconomic History  . Marital status: Married    Spouse name: Not on file  . Number of children: 2  . Years of education: Not on file  . Highest education level: Not on file  Occupational History  . Not on file  Social Needs  . Financial resource strain: Not on file  . Food insecurity:    Worry: Not on file    Inability: Not on file  . Transportation needs:    Medical: Not on file    Non-medical: Not on file  Tobacco Use  . Smoking status: Former Smoker    Years: 2.00    Types: Cigarettes    Last attempt to quit: 1980    Years since quitting: 40.3  . Smokeless tobacco: Never Used  . Tobacco comment: 11/10/2017 "only smoked when I was on my period"  Substance and Sexual Activity  . Alcohol use: Yes    Comment: 11/10/2017 "nothing lately; did have 1 glass of wine q night"  . Drug use: Never  . Sexual activity: Not Currently  Lifestyle  . Physical activity:    Days per week: Not on file    Minutes per session: Not on file  . Stress: Not on file  Relationships  . Social connections:    Talks on phone: Not on file    Gets together: Not on file    Attends religious service: Not on file    Active member of club or organization: Not on file    Attends meetings of clubs or organizations: Not on file    Relationship status: Not on file  . Intimate partner violence:    Fear of current or ex partner: Not on file    Emotionally abused: Not on file    Physically abused: Not on file    Forced sexual activity: Not on file  Other Topics Concern  . Not on file  Social History Narrative   Right handed     Current Outpatient Medications on File Prior to Visit  Medication Sig Dispense Refill  . acetaminophen (TYLENOL) 500 MG tablet Take 500 mg by mouth every 6 (six) hours as needed for mild pain.     . Alpha-Lipoic Acid (LIPOIC ACID PO)  Take 2 tablets by mouth 2 (two) times daily.     Marland Kitchen apixaban (ELIQUIS) 5 MG TABS tablet Take 5 mg by mouth 2 (two) times a day.    Marland Kitchen atorvastatin (LIPITOR) 10 MG tablet TAKE 1 TABLET BY MOUTH EVERY DAY 90 tablet 1  . Calcium Carbonate-Vitamin D3 600-400 MG-UNIT TABS Take 1 tablet by mouth 2 (two) times a day.     . cetirizine (ZYRTEC) 10 MG tablet Take 10 mg by mouth at bedtime.    . cholecalciferol (VITAMIN D) 1000 UNITS tablet Take 1,000 Units by mouth daily.    Marland Kitchen  CRANBERRY CONCENTRATE PO Take 650 mg by mouth 2 (two) times a day.     . cycloSPORINE (RESTASIS) 0.05 % ophthalmic emulsion Place 1 drop into both eyes as needed.     . diltiazem (CARDIZEM CD) 120 MG 24 hr capsule TAKE 1 CAPSULE BY MOUTH DAILY *NEED OFFICE VISIT FOR REFILLS* (Patient taking differently: Take 120 mg by mouth daily. ) 90 capsule 1  . DULoxetine (CYMBALTA) 30 MG capsule TAKE 1 CAPSULE (30 MG TOTAL) BY MOUTH 2 (TWO) TIMES DAILY. 180 capsule 2  . GLUCOSAMINE-CHONDROITIN PO Take 2 tablets by mouth daily with lunch. Glucosamine 1500mg  Chondroitin 1200mg     . Lutein-Zeaxanthin 25-5 MG CAPS Take 1 tablet by mouth daily.    . Melatonin 5 MG TABS Take 3-5 mg by mouth at bedtime.     . Multiple Vitamin (MULTIVITAMIN WITH MINERALS) TABS tablet Take 1 tablet by mouth daily.    . nitrofurantoin (MACRODANTIN) 50 MG capsule Take 50 mg by mouth daily.     . nitroGLYCERIN (NITROLINGUAL) 0.4 MG/SPRAY spray Place 1 spray under the tongue every 5 (five) minutes x 3 doses as needed for chest pain.    . Omega-3 1000 MG CAPS Take 1,000 mg by mouth daily.    . pantoprazole (PROTONIX) 40 MG tablet Take 40 mg by mouth daily before breakfast.   1  . simethicone (MYLICON) 80 MG chewable tablet Chew 80 mg by mouth every 6 (six) hours as needed for flatulence.    . sucralfate (CARAFATE) 1 g tablet Take 1 g by mouth 4 (four) times daily -  with meals and at bedtime.    Marland Kitchen thyroid (ARMOUR) 60 MG tablet Take 60 mg by mouth daily before breakfast.    .  tiZANidine (ZANAFLEX) 4 MG tablet TAKE 1 TABLET BY MOUTH THREE TIMES A DAY AS NEEDED (Patient taking differently: Take 2 mg by mouth 3 (three) times daily. ) 90 tablet 5  . vitamin C (ASCORBIC ACID) 500 MG tablet Take 1,000 mg by mouth daily.     Marland Kitchen CALCIUM/MAGNESIUM/ZINC FORMULA PO Take 1 tablet by mouth daily.     No current facility-administered medications on file prior to visit.     Review of Systems  Constitution: Positive for malaise/fatigue. Negative for decreased appetite, weight gain and weight loss.  HENT: Negative for congestion.   Eyes: Negative for visual disturbance.  Cardiovascular: Positive for chest pain (occasional sharp pain). Negative for dyspnea on exertion, leg swelling, palpitations and syncope.  Respiratory: Negative for shortness of breath.   Endocrine: Negative for cold intolerance.  Hematologic/Lymphatic: Does not bruise/bleed easily.  Skin: Negative for itching and rash.  Musculoskeletal: Negative for falls and myalgias.  Gastrointestinal: Negative for abdominal pain, nausea and vomiting.  Genitourinary: Negative for dysuria.  Neurological: Positive for dizziness and light-headedness. Negative for weakness.  Psychiatric/Behavioral: The patient is not nervous/anxious.   All other systems reviewed and are negative.     Objective:  Blood pressure (!) 152/100, pulse 79, height 5\' 5"  (1.651 m), weight 155 lb (70.3 kg). Body mass index is 25.79 kg/m.  Limited exam due to medial conferencing, she did not appear to be in acute distress, well built, alert.  No leg edema, she does have a very large ulceration on the dorsum of her left foot that appears chronic.  Prior examination is as below.  Physical Exam  Constitutional: She is oriented to person, place, and time. She appears well-developed and well-nourished. No distress.  HENT:  Head: Normocephalic and atraumatic.  Eyes: Pupils are equal, round, and reactive to light. Conjunctivae are normal.  Neck: No JVD  present.  Cardiovascular: Normal rate, regular rhythm and intact distal pulses.  No murmur heard. Pulmonary/Chest: Effort normal and breath sounds normal. She has no wheezes. She has no rales.  Abdominal: Soft. Bowel sounds are normal. There is no rebound.  Musculoskeletal:        General: No edema.     Comments: Left foot dorsum has bony protrusion  Lymphadenopathy:    She has no cervical adenopathy.  Neurological: She is alert and oriented to person, place, and time. No cranial nerve deficit.  Skin: Skin is warm and dry.  Psychiatric: She has a normal mood and affect.  Nursing note and vitals reviewed.  Radiology: No results found. Laboratory Examination:    CMP Latest Ref Rng & Units 06/20/2018 03/28/2018 03/27/2018  Glucose 70 - 99 mg/dL 134(H) 104(H) 109(H)  BUN 8 - 23 mg/dL 23 11 15   Creatinine 0.44 - 1.00 mg/dL 1.01(H) 0.63 0.64  Sodium 135 - 145 mmol/L 139 141 143  Potassium 3.5 - 5.1 mmol/L 2.8(L) 3.6 3.4(L)  Chloride 98 - 111 mmol/L 101 107 107  CO2 22 - 32 mmol/L 24 24 25   Calcium 8.9 - 10.3 mg/dL 9.6 8.8(L) 8.6(L)  Total Protein 6.5 - 8.1 g/dL - 5.9(L) -  Total Bilirubin 0.3 - 1.2 mg/dL - 0.5 -  Alkaline Phos 38 - 126 U/L - 46 -  AST 15 - 41 U/L - 22 -  ALT 0 - 44 U/L - 21 -   CBC Latest Ref Rng & Units 06/20/2018 03/27/2018 03/26/2018  WBC 4.0 - 10.5 K/uL 12.3(H) 4.7 4.9  Hemoglobin 12.0 - 15.0 g/dL 12.6 10.6(L) 10.1(L)  Hematocrit 36.0 - 46.0 % 40.0 33.0(L) 32.4(L)  Platelets 150 - 400 K/uL 210 166 170   Lipid Panel     Component Value Date/Time   CHOL 165 11/24/2016 0040   TRIG 88 11/24/2016 0040   HDL 61 11/24/2016 0040   CHOLHDL 2.7 11/24/2016 0040   VLDL 18 11/24/2016 0040   LDLCALC 86 11/24/2016 0040   HEMOGLOBIN A1C Lab Results  Component Value Date   HGBA1C 5.6 11/24/2016   MPG 114 11/24/2016   TSH Recent Labs    11/06/17 1117 03/27/18 0909  TSH 1.341 1.230    Cardiac studies:   Event Monitor for 30 days Start date 07/01/2018 for  syncope : A. Fib one episode, no symptoms reported. No heart block or significant bradycardia  Carotid duplex 03/27/2018:  Right Carotid: Velocities in the right ICA are consistent with a 1-39% stenosis. Left Carotid: Velocities in the left ICA are consistent with a 1-39% stenosis. Vertebrals:  Bilateral vertebral arteries demonstrate antegrade flow. Subclavians: Normal flow hemodynamics were seen in bilateral subclavian arteries.   Event Monitor 30 days 11/11/2017: Predominant rhythm is normal sinus rhythm. Symptoms of fatigue reveals normal sinus rhythm. Asymptomatic atrial fibrillation and probable atypical atrial flutter noted on 11/13/2017, 11/18/2017 and 12/09/2017 at 3:00 AM. Occasional PACs. Arrhythmia/PVC burden 6%.   Echo 11/09/2017: Left ventricle: The cavity size was normal. Systolic function was   normal. The estimated ejection fraction was in the range of 60% to 65%. Wall motion was normal; there were no regional wall   motion abnormalities. The study is not technically sufficient to   allow evaluation of LV diastolic function.Pulmonary arteries: PA peak pressure: 32 mm Hg (S).   Coronary angiogram 11/24/2016:  Normal coronaries; tortuous LAD  Assessment:  PAF (paroxysmal atrial fibrillation) (HCC)  Essential hypertension, benign  Postural dizziness with presyncope  OSA (obstructive sleep apnea)  EKG 06/20/2018: Atrial tachycardia at the rate of 133 bpm.  Left axis deviation, left anterior fascicular block.  Poor R-wave progression.  Incomplete right bundle branch block.   EKG 06/21/2018: Normal sinus rhythm at the rate of 75 bpm, left axis deviation, incomplete right bundle branch block.  Poor R-wave progression.  Nonspecific T abnormality.  Recommendations:    Patient's symptoms of fatigue, Dyspnea is related to deconditioning, sleep apnea and also orthostatic hypotension. Advised her to increase her physical activity as tolerated.  She is presently wearing an event  monitor, did have an episode of atrial fibrillation with uncontrolled ventricular response, but no heart block to explain syncope or dizzy spells. Blood pressure is relatively well controlled.  I did not make any changes to her medication.  She has developed a very large at least more than a quarter size lesion on the dorsum of the left foot, this appears to be a chronic traumatic ulceration but I cannot exclude squamous carcinoma.  I will make a referral to be evaluated by Dr. Jacqualyn Posey at [place].  I'll see her back in 6 months for follow-up.  Continue anticoagulation in view of high cardioembolic risk.  Her husband is also present in the arterioles/video conferencing today.  Adrian Prows, MD, [facility] 08/04/2018, 11:28 AM [facility]. [place] Pager: [phone] Office: [phone] If no answer Cell [phone]

## 2018-08-05 DIAGNOSIS — N390 Urinary tract infection, site not specified: Secondary | ICD-10-CM | POA: Diagnosis not present

## 2018-08-05 DIAGNOSIS — R358 Other polyuria: Secondary | ICD-10-CM | POA: Diagnosis not present

## 2018-08-09 ENCOUNTER — Other Ambulatory Visit: Payer: Self-pay

## 2018-08-09 ENCOUNTER — Ambulatory Visit (INDEPENDENT_AMBULATORY_CARE_PROVIDER_SITE_OTHER): Payer: Medicare PPO | Admitting: Podiatry

## 2018-08-09 ENCOUNTER — Ambulatory Visit (INDEPENDENT_AMBULATORY_CARE_PROVIDER_SITE_OTHER): Payer: Medicare PPO

## 2018-08-09 ENCOUNTER — Encounter: Payer: Self-pay | Admitting: Podiatry

## 2018-08-09 VITALS — Temp 98.1°F

## 2018-08-09 DIAGNOSIS — S92405A Nondisplaced unspecified fracture of left great toe, initial encounter for closed fracture: Secondary | ICD-10-CM | POA: Diagnosis not present

## 2018-08-09 DIAGNOSIS — M2142 Flat foot [pes planus] (acquired), left foot: Secondary | ICD-10-CM

## 2018-08-09 DIAGNOSIS — L97529 Non-pressure chronic ulcer of other part of left foot with unspecified severity: Secondary | ICD-10-CM

## 2018-08-09 DIAGNOSIS — M2141 Flat foot [pes planus] (acquired), right foot: Secondary | ICD-10-CM

## 2018-08-09 DIAGNOSIS — L988 Other specified disorders of the skin and subcutaneous tissue: Secondary | ICD-10-CM | POA: Diagnosis not present

## 2018-08-09 DIAGNOSIS — M898X9 Other specified disorders of bone, unspecified site: Secondary | ICD-10-CM | POA: Diagnosis not present

## 2018-08-09 DIAGNOSIS — L989 Disorder of the skin and subcutaneous tissue, unspecified: Secondary | ICD-10-CM

## 2018-08-09 DIAGNOSIS — L858 Other specified epidermal thickening: Secondary | ICD-10-CM | POA: Diagnosis not present

## 2018-08-11 NOTE — Addendum Note (Signed)
Addended by: Cranford Mon R on: 08/11/2018 03:21 PM   Modules accepted: Orders

## 2018-08-11 NOTE — Addendum Note (Signed)
Addended by: Cranford Mon R on: 08/11/2018 02:48 PM   Modules accepted: Orders

## 2018-08-11 NOTE — Progress Notes (Signed)
Subjective:   [name], female   DOB: 78 y.o.   MRN: [id number]   HPI 78 year old female presents to the office today with her husband for concerns of a skin lesion on the bottom of her left foot on the medial aspect.  She states this is been ongoing for some time and she also states that she is seeing dermatology for this.  She discussed this with her cardiologist who referred her to me for evaluation.  Over the last month she has noticed some increase pain to the area.  At times will get red around the edges.  Denies any drainage or pus.  She also has secondary concerns that yesterday she fell and hit her left big toe.  Since then she has noted some bruising to the toe as well as some stiffness.  No recent treatment other than wearing a different shoe to avoid pressure.   Review of Systems  All other systems reviewed and are negative.  Past Medical History:  Diagnosis Date  . Acute blood loss anemia 10/27/2017  . Acute GI bleeding 10/27/2017  . Anemia   . Arthritis    "joints" (11/10/2017)  . Dizziness   . GERD (gastroesophageal reflux disease)   . GI bleed 11/06/2017  . Hearing loss   . History of blood transfusion 10/2017  . Hyperlipidemia   . Hyperthyroidism   . Melanoma ([place])    "cut off my back"  . Memory loss   . Sinus headache   . Sleep apnea     Past Surgical History:  Procedure Laterality Date  . ABDOMINAL HYSTERECTOMY     "partial"  . APPENDECTOMY    . BIOPSY  11/17/2017   Procedure: BIOPSY;  Surgeon: Clarene Essex, MD;  [facility];  Service: Endoscopy;;  . COLONOSCOPY WITH PROPOFOL N/A 11/17/2017   Procedure: COLONOSCOPY WITH PROPOFOL;  Surgeon: Clarene Essex, MD;  [facility];  Service: Endoscopy;  Laterality: N/A;  . CRANIECTOMY FOR EXCISION OF ACOUSTIC NEUROMA    . ESOPHAGOGASTRODUODENOSCOPY (EGD) WITH PROPOFOL N/A 10/28/2017   Procedure: ESOPHAGOGASTRODUODENOSCOPY (EGD) WITH PROPOFOL;  Surgeon: Clarene Essex, MD;  [facility];  Service: Endoscopy;  Laterality: N/A;  . KNEE ARTHROSCOPY Left   . LEFT HEART CATH AND CORONARY ANGIOGRAPHY N/A 11/24/2016   Procedure: LEFT HEART CATH AND CORONARY ANGIOGRAPHY;  Surgeon: Jettie Booze, MD;  Location: [facility];  Service: Cardiovascular;  Laterality: N/A;  . MELANOMA EXCISION     "off my back"  . NASAL SINUS SURGERY    . TONSILLECTOMY       Current Outpatient Medications:  .  acetaminophen (TYLENOL) 500 MG tablet, Take 500 mg by mouth every 6 (six) hours as needed for mild pain. , Disp: , Rfl:  .  Alpha-Lipoic Acid (LIPOIC ACID PO), Take 2 tablets by mouth 2 (two) times daily. , Disp: , Rfl:  .  apixaban (ELIQUIS) 5 MG TABS tablet, Take 5 mg by mouth 2 (two) times a day., Disp: , Rfl:  .  atorvastatin (LIPITOR) 10 MG tablet, TAKE 1 TABLET BY MOUTH EVERY DAY, Disp: 90 tablet, Rfl: 1 .  Calcium Carbonate-Vitamin D3 600-400 MG-UNIT TABS, Take 1 tablet by mouth 2 (two) times a day. , Disp: , Rfl:  .  CALCIUM/MAGNESIUM/ZINC FORMULA PO, Take 1 tablet by mouth daily., Disp: , Rfl:  .  cetirizine (ZYRTEC) 10 MG tablet, Take 10 mg by mouth at bedtime., Disp: , Rfl:  .  cholecalciferol (VITAMIN  D) 1000 UNITS tablet, Take 1,000 Units by mouth daily., Disp: , Rfl:  .  CRANBERRY CONCENTRATE PO, Take 650 mg by mouth 2 (two) times a day. , Disp: , Rfl:  .  cycloSPORINE (RESTASIS) 0.05 % ophthalmic emulsion, Place 1 drop into both eyes as needed. , Disp: , Rfl:  .  diltiazem (CARDIZEM CD) 120 MG 24 hr capsule, TAKE 1 CAPSULE BY MOUTH DAILY *NEED OFFICE VISIT FOR REFILLS* (Patient taking differently: Take 120 mg by mouth daily. ), Disp: 90 capsule, Rfl: 1 .  DULoxetine (CYMBALTA) 30 MG capsule, TAKE 1 CAPSULE (30 MG TOTAL) BY MOUTH 2 (TWO) TIMES DAILY., Disp: 180 capsule, Rfl: 2 .  GLUCOSAMINE-CHONDROITIN PO, Take 2 tablets by mouth daily with lunch. Glucosamine 1500mg  Chondroitin 1200mg , Disp: , Rfl:  .  Lutein-Zeaxanthin 25-5 MG CAPS, Take 1 tablet by mouth  daily., Disp: , Rfl:  .  Melatonin 5 MG TABS, Take 3-5 mg by mouth at bedtime. , Disp: , Rfl:  .  Multiple Vitamin (MULTIVITAMIN WITH MINERALS) TABS tablet, Take 1 tablet by mouth daily., Disp: , Rfl:  .  nitrofurantoin (MACRODANTIN) 50 MG capsule, Take 50 mg by mouth daily. , Disp: , Rfl:  .  nitroGLYCERIN (NITROLINGUAL) 0.4 MG/SPRAY spray, Place 1 spray under the tongue every 5 (five) minutes x 3 doses as needed for chest pain., Disp: , Rfl:  .  Omega-3 1000 MG CAPS, Take 1,000 mg by mouth daily., Disp: , Rfl:  .  pantoprazole (PROTONIX) 40 MG tablet, Take 40 mg by mouth daily before breakfast. , Disp: , Rfl: 1 .  simethicone (MYLICON) 80 MG chewable tablet, Chew 80 mg by mouth every 6 (six) hours as needed for flatulence., Disp: , Rfl:  .  sucralfate (CARAFATE) 1 g tablet, Take 1 g by mouth 4 (four) times daily -  with meals and at bedtime., Disp: , Rfl:  .  thyroid (ARMOUR) 60 MG tablet, Take 60 mg by mouth daily before breakfast., Disp: , Rfl:  .  tiZANidine (ZANAFLEX) 4 MG tablet, TAKE 1 TABLET BY MOUTH THREE TIMES A DAY AS NEEDED (Patient taking differently: Take 2 mg by mouth 3 (three) times daily. ), Disp: 90 tablet, Rfl: 5 .  vitamin C (ASCORBIC ACID) 500 MG tablet, Take 1,000 mg by mouth daily. , Disp: , Rfl:   Allergies  Allergen Reactions  . Avelox  [Moxifloxacin Hcl In Nacl]   . Alendronate Sodium Nausea Only         Objective:  Physical Exam  General: AAO x3, NAD  Dermatological: Thick hyperkeratotic lesion left foot plantarly on the medial aspect this is a long the talonavicular joint plantarly.  Upon debridement there is no ongoing ulceration, drainage or any signs of infection.  The color is uniform.  Also on the contralateral extremity there is a start of the same lesion.  No open lesions bilaterally.  There is mild bruising to the left hallux.  Vascular: Dorsalis Pedis artery and Posterior Tibial artery pedal pulses are 2/4 bilateral with immedate capillary fill time.  There is no pain with calf compression, swelling, warmth, erythema.   Neruologic: Grossly intact via light touch bilateral.  Protective threshold with Semmes Wienstein monofilament intact to all pedal sites bilateral.  Musculoskeletal: Significant flatfoot deformity.  There is tenderness palpation along the hallux and minimal edema.  There is no pain with MPJ range of motion.  No other areas of pinpoint tenderness.  Muscular strength 5/5 in all groups tested bilateral.  Gait: Unassisted,  Nonantalgic.       Assessment:   Skin lesion left foot likely due to flatfoot deformity;  hallux fracture     Plan:  -Treatment options discussed including all alternatives, risks, and complications -Etiology of symptoms were discussed -X-rays were obtained and reviewed with the patient. No underlying bony changes are present on the area of the skin lesion.  The proximal phalanx of the hallux with questionable area of fracture. -Given x-ray findings as well as her tenderness on the hallux I do recommend immobilization in surgical shoe but she declines this.  Discussed wearing a stiffer bottom shoe. -I debrided the hyperkeratotic lesion the left foot.  I did send this to pathology for evaluation there was concern for possibly other pathology although clinically appears to be more callus and due to pressure from her foot type.  After debridement there was resolution of pain.  I do think that long-term she will benefit from orthotics.  I had Liliane Channel evaluate her today and she was measured for inserts to offload the hyperkeratotic areas and give support  Trula Slade DPM

## 2018-08-16 ENCOUNTER — Other Ambulatory Visit: Payer: Self-pay | Admitting: Cardiology

## 2018-08-16 DIAGNOSIS — I480 Paroxysmal atrial fibrillation: Secondary | ICD-10-CM

## 2018-08-16 DIAGNOSIS — R252 Cramp and spasm: Secondary | ICD-10-CM | POA: Diagnosis not present

## 2018-08-16 NOTE — Telephone Encounter (Signed)
Please fill

## 2018-08-18 DIAGNOSIS — G4733 Obstructive sleep apnea (adult) (pediatric): Secondary | ICD-10-CM | POA: Diagnosis not present

## 2018-08-20 ENCOUNTER — Other Ambulatory Visit: Payer: Self-pay | Admitting: Neurology

## 2018-08-25 DIAGNOSIS — E876 Hypokalemia: Secondary | ICD-10-CM | POA: Diagnosis not present

## 2018-08-25 DIAGNOSIS — R358 Other polyuria: Secondary | ICD-10-CM | POA: Diagnosis not present

## 2018-08-26 ENCOUNTER — Telehealth: Payer: Self-pay | Admitting: *Deleted

## 2018-08-26 DIAGNOSIS — N390 Urinary tract infection, site not specified: Secondary | ICD-10-CM | POA: Diagnosis not present

## 2018-08-26 NOTE — Telephone Encounter (Signed)
-----   Message from Trula Slade, DPM sent at 08/25/2018  5:11 PM EDT ----- Lattie Haw- please let her know the pathology shows callus which is what we talked about. Thanks.

## 2018-08-26 NOTE — Telephone Encounter (Signed)
Called and left a message for the patient to call me back-I stated that the pathology results showed that it was a callus and to call the [place] office if any concerns and questions to call the [place] office at [phone]. Lattie Haw

## 2018-08-27 ENCOUNTER — Other Ambulatory Visit: Payer: Self-pay | Admitting: Neurology

## 2018-09-06 ENCOUNTER — Encounter: Payer: Medicare PPO | Admitting: Orthotics

## 2018-09-06 ENCOUNTER — Ambulatory Visit: Payer: Medicare PPO | Admitting: Podiatry

## 2018-09-10 DIAGNOSIS — R32 Unspecified urinary incontinence: Secondary | ICD-10-CM | POA: Diagnosis not present

## 2018-09-10 DIAGNOSIS — Z7901 Long term (current) use of anticoagulants: Secondary | ICD-10-CM | POA: Diagnosis not present

## 2018-09-10 DIAGNOSIS — I10 Essential (primary) hypertension: Secondary | ICD-10-CM | POA: Diagnosis not present

## 2018-09-10 DIAGNOSIS — N390 Urinary tract infection, site not specified: Secondary | ICD-10-CM | POA: Diagnosis not present

## 2018-09-10 DIAGNOSIS — H9190 Unspecified hearing loss, unspecified ear: Secondary | ICD-10-CM | POA: Diagnosis not present

## 2018-09-10 DIAGNOSIS — Z809 Family history of malignant neoplasm, unspecified: Secondary | ICD-10-CM | POA: Diagnosis not present

## 2018-09-10 DIAGNOSIS — Z79899 Other long term (current) drug therapy: Secondary | ICD-10-CM | POA: Diagnosis not present

## 2018-09-10 DIAGNOSIS — E559 Vitamin D deficiency, unspecified: Secondary | ICD-10-CM | POA: Diagnosis not present

## 2018-09-10 DIAGNOSIS — M47819 Spondylosis without myelopathy or radiculopathy, site unspecified: Secondary | ICD-10-CM | POA: Diagnosis not present

## 2018-09-10 DIAGNOSIS — M62838 Other muscle spasm: Secondary | ICD-10-CM | POA: Diagnosis not present

## 2018-09-10 DIAGNOSIS — R42 Dizziness and giddiness: Secondary | ICD-10-CM | POA: Diagnosis not present

## 2018-09-10 DIAGNOSIS — E785 Hyperlipidemia, unspecified: Secondary | ICD-10-CM | POA: Diagnosis not present

## 2018-09-10 DIAGNOSIS — M19019 Primary osteoarthritis, unspecified shoulder: Secondary | ICD-10-CM | POA: Diagnosis not present

## 2018-09-10 DIAGNOSIS — Z9181 History of falling: Secondary | ICD-10-CM | POA: Diagnosis not present

## 2018-09-10 DIAGNOSIS — Z973 Presence of spectacles and contact lenses: Secondary | ICD-10-CM | POA: Diagnosis not present

## 2018-09-10 DIAGNOSIS — Z974 Presence of external hearing-aid: Secondary | ICD-10-CM | POA: Diagnosis not present

## 2018-09-10 DIAGNOSIS — H04129 Dry eye syndrome of unspecified lacrimal gland: Secondary | ICD-10-CM | POA: Diagnosis not present

## 2018-09-10 DIAGNOSIS — G4700 Insomnia, unspecified: Secondary | ICD-10-CM | POA: Diagnosis not present

## 2018-09-10 DIAGNOSIS — E039 Hypothyroidism, unspecified: Secondary | ICD-10-CM | POA: Diagnosis not present

## 2018-09-10 DIAGNOSIS — G4733 Obstructive sleep apnea (adult) (pediatric): Secondary | ICD-10-CM | POA: Diagnosis not present

## 2018-09-10 DIAGNOSIS — H547 Unspecified visual loss: Secondary | ICD-10-CM | POA: Diagnosis not present

## 2018-09-12 DIAGNOSIS — N393 Stress incontinence (female) (male): Secondary | ICD-10-CM | POA: Diagnosis not present

## 2018-09-14 DIAGNOSIS — R300 Dysuria: Secondary | ICD-10-CM | POA: Diagnosis not present

## 2018-09-15 ENCOUNTER — Other Ambulatory Visit: Payer: Self-pay | Admitting: Neurology

## 2018-09-16 ENCOUNTER — Telehealth: Payer: Self-pay | Admitting: *Deleted

## 2018-09-16 NOTE — Telephone Encounter (Signed)
Pt's husband, Jearld Fenton states they received a call from our office. I spoke with D. Greenlee and Diabetic Microbiologist and she states pt needs an appt to pick up her orthotics. I transferred pt's husband to scheduler.

## 2018-09-18 DIAGNOSIS — G4733 Obstructive sleep apnea (adult) (pediatric): Secondary | ICD-10-CM | POA: Diagnosis not present

## 2018-09-21 ENCOUNTER — Emergency Department (HOSPITAL_COMMUNITY)
Admission: EM | Admit: 2018-09-21 | Discharge: 2018-09-21 | Disposition: A | Discharge status: Home / Self Care | Payer: Medicare PPO | Attending: Emergency Medicine | Admitting: Emergency Medicine

## 2018-09-21 ENCOUNTER — Other Ambulatory Visit: Payer: Self-pay

## 2018-09-21 DIAGNOSIS — E079 Disorder of thyroid, unspecified: Secondary | ICD-10-CM | POA: Insufficient documentation

## 2018-09-21 DIAGNOSIS — Z87891 Personal history of nicotine dependence: Secondary | ICD-10-CM | POA: Diagnosis not present

## 2018-09-21 DIAGNOSIS — Z79899 Other long term (current) drug therapy: Secondary | ICD-10-CM | POA: Insufficient documentation

## 2018-09-21 DIAGNOSIS — R42 Dizziness and giddiness: Secondary | ICD-10-CM | POA: Diagnosis not present

## 2018-09-21 DIAGNOSIS — E860 Dehydration: Secondary | ICD-10-CM

## 2018-09-21 DIAGNOSIS — R001 Bradycardia, unspecified: Secondary | ICD-10-CM | POA: Diagnosis not present

## 2018-09-21 DIAGNOSIS — Z85828 Personal history of other malignant neoplasm of skin: Secondary | ICD-10-CM | POA: Diagnosis not present

## 2018-09-21 DIAGNOSIS — N390 Urinary tract infection, site not specified: Secondary | ICD-10-CM | POA: Insufficient documentation

## 2018-09-21 DIAGNOSIS — R0902 Hypoxemia: Secondary | ICD-10-CM | POA: Diagnosis not present

## 2018-09-21 DIAGNOSIS — R5383 Other fatigue: Secondary | ICD-10-CM | POA: Diagnosis not present

## 2018-09-21 DIAGNOSIS — I959 Hypotension, unspecified: Secondary | ICD-10-CM | POA: Diagnosis not present

## 2018-09-21 LAB — URINALYSIS, ROUTINE W REFLEX MICROSCOPIC
Bilirubin Urine: NEGATIVE
Glucose, UA: NEGATIVE mg/dL
Hgb urine dipstick: NEGATIVE
Ketones, ur: NEGATIVE mg/dL
Nitrite: NEGATIVE
Protein, ur: NEGATIVE mg/dL
Specific Gravity, Urine: 1.009 (ref 1.005–1.030)
pH: 6 (ref 5.0–8.0)

## 2018-09-21 LAB — CBC WITH DIFFERENTIAL/PLATELET
Abs Immature Granulocytes: 0.04 10*3/uL (ref 0.00–0.07)
Basophils Absolute: 0.1 10*3/uL (ref 0.0–0.1)
Basophils Relative: 1 %
Eosinophils Absolute: 0.1 10*3/uL (ref 0.0–0.5)
Eosinophils Relative: 1 %
HCT: 35.5 % — ABNORMAL LOW (ref 36.0–46.0)
Hemoglobin: 11.4 g/dL — ABNORMAL LOW (ref 12.0–15.0)
Immature Granulocytes: 0 %
Lymphocytes Relative: 18 %
Lymphs Abs: 1.7 10*3/uL (ref 0.7–4.0)
MCH: 30.2 pg (ref 26.0–34.0)
MCHC: 32.1 g/dL (ref 30.0–36.0)
MCV: 93.9 fL (ref 80.0–100.0)
Monocytes Absolute: 1 10*3/uL (ref 0.1–1.0)
Monocytes Relative: 11 %
Neutro Abs: 6.4 10*3/uL (ref 1.7–7.7)
Neutrophils Relative %: 69 %
Platelets: 198 10*3/uL (ref 150–400)
RBC: 3.78 MIL/uL — ABNORMAL LOW (ref 3.87–5.11)
RDW: 13.1 % (ref 11.5–15.5)
WBC: 9.2 10*3/uL (ref 4.0–10.5)
nRBC: 0 % (ref 0.0–0.2)

## 2018-09-21 LAB — BASIC METABOLIC PANEL
Anion gap: 11 (ref 5–15)
BUN: 21 mg/dL (ref 8–23)
CO2: 26 mmol/L (ref 22–32)
Calcium: 9.1 mg/dL (ref 8.9–10.3)
Chloride: 103 mmol/L (ref 98–111)
Creatinine, Ser: 1.15 mg/dL — ABNORMAL HIGH (ref 0.44–1.00)
GFR calc Af Amer: 54 mL/min — ABNORMAL LOW (ref >60)
GFR calc non Af Amer: 46 mL/min — ABNORMAL LOW (ref >60)
Glucose, Bld: 104 mg/dL — ABNORMAL HIGH (ref 70–99)
Potassium: 3.8 mmol/L (ref 3.5–5.1)
Sodium: 140 mmol/L (ref 135–145)

## 2018-09-21 LAB — POC OCCULT BLOOD, ED: Fecal Occult Bld: NEGATIVE

## 2018-09-21 LAB — TYPE AND SCREEN
ABO/RH(D): A POS
Antibody Screen: NEGATIVE

## 2018-09-21 LAB — CBG MONITORING, ED: Glucose-Capillary: 104 mg/dL — ABNORMAL HIGH (ref 70–99)

## 2018-09-21 MED ORDER — CEPHALEXIN 250 MG PO CAPS
500.0000 mg | ORAL_CAPSULE | Freq: Once | ORAL | Status: AC
  Administered 2018-09-21: 500 mg via ORAL
  Filled 2018-09-21: qty 2

## 2018-09-21 MED ORDER — SODIUM CHLORIDE 0.9 % IV BOLUS
1000.0000 mL | Freq: Once | INTRAVENOUS | Status: AC
  Administered 2018-09-21: 1000 mL via INTRAVENOUS

## 2018-09-21 MED ORDER — CEPHALEXIN 500 MG PO CAPS: 500.0000 mg | ORAL_CAPSULE | Freq: Four times a day (QID) | ORAL | 0 refills | Status: DC

## 2018-09-21 MED ORDER — SODIUM CHLORIDE 0.9 % IV SOLN
INTRAVENOUS | Status: DC
  Administered 2018-09-21: 20:00:00 via INTRAVENOUS

## 2018-09-21 NOTE — ED Provider Notes (Signed)
[facility] EMERGENCY DEPARTMENT Provider Note   CSN: [id number] Arrival date & time: 09/21/18  1735    History   Chief Complaint Chief Complaint  Patient presents with  . Dizziness  . Hypotension    HPI Clarita Mcelvain is a 78 y.o. female.     The history is provided by the patient and medical records. No language interpreter was used.  Dizziness     78 year old female with history of anemia, recurrent dizziness, prior blood transfusion history of GI bleed presenting complaining of dizziness.  Patient brought here via EMS from home.  Patient report she was sitting with her husband earlier today when she felt as if she was going to pass out.  She complained of dizziness but did not report room spinning sensation.  Her husband was concerned EMS was contacted.  When EMS arrived, patient was noted to be hypertensive with initial blood pressure of 74/54.  She received a total of 41ml of fluid and felt better.  Her blood pressure improved to 102/52.  Her dizziness seems to resolved.  She denies any associated headache, vision changes, ear pain, neck pain, chest pain, trouble breathing, abdominal pain, nausea, vomiting, focal numbness or weakness.  She did report having intermittent episodes of dizziness like this in the past.  She did not recall any prior blood transfusion.  She denies any abnormal bleeding no black tarry stools or no hematemesis.  Unsure of this any recent medication changes.   Past Medical History:  Diagnosis Date  . Acute blood loss anemia 10/27/2017  . Acute GI bleeding 10/27/2017  . Anemia   . Arthritis    "joints" (11/10/2017)  . Dizziness   . GERD (gastroesophageal reflux disease)   . GI bleed 11/06/2017  . Hearing loss   . History of blood transfusion 10/2017  . Hyperlipidemia   . Hyperthyroidism   . Melanoma ([place])    "cut off my back"  . Memory loss   . Sinus headache   . Sleep apnea     Patient Active Problem List   Diagnosis Date  Noted  . Nocturia 07/01/2018  . Midline cystocele 06/08/2018  . Recurrent UTI 06/08/2018  . Vaginal vault prolapse 06/08/2018  . Lightheadedness 06/02/2018  . Near syncope   . Hypotension 03/17/2018  . Postural dizziness with presyncope 03/17/2018  . PAF (paroxysmal atrial fibrillation) ([place]) 03/17/2018  . Memory loss 11/25/2017  . Acute lower UTI 10/29/2017  . Abnormal stress test   . Chest pain 11/23/2016  . Hyperlipidemia 11/23/2016  . GERD (gastroesophageal reflux disease) 11/23/2016  . History of recurrent UTIs 11/23/2016  . Polyneuropathy 10/30/2016  . Bursitis, subacromial 10/30/2016  . Numbness 10/30/2016  . Arthritis, senescent 01/03/2016  . Left knee pain 01/03/2016  . Pain in joint, ankle and foot 02/28/2015  . Dry mouth 08/07/2014  . Dry eyes 08/07/2014  . Other headache syndrome 06/25/2014  . Sinusitis, chronic 06/25/2014  . Neck pain 06/25/2014  . OSA (obstructive sleep apnea) 06/25/2014  . Essential hypertension, benign 06/25/2014    Past Surgical History:  Procedure Laterality Date  . ABDOMINAL HYSTERECTOMY     "partial"  . APPENDECTOMY    . BIOPSY  11/17/2017   Procedure: BIOPSY;  Surgeon: Clarene Essex, MD;  Location: [facility];  Service: Endoscopy;;  . COLONOSCOPY WITH PROPOFOL N/A 11/17/2017   Procedure: COLONOSCOPY WITH PROPOFOL;  Surgeon: Clarene Essex, MD;  Location: [facility];  Service: Endoscopy;  Laterality: N/A;  . CRANIECTOMY FOR EXCISION  OF ACOUSTIC NEUROMA    . ESOPHAGOGASTRODUODENOSCOPY (EGD) WITH PROPOFOL N/A 10/28/2017   Procedure: ESOPHAGOGASTRODUODENOSCOPY (EGD) WITH PROPOFOL;  Surgeon: Clarene Essex, MD;  Location: [facility];  Service: Endoscopy;  Laterality: N/A;  . KNEE ARTHROSCOPY Left   . LEFT HEART CATH AND CORONARY ANGIOGRAPHY N/A 11/24/2016   Procedure: LEFT HEART CATH AND CORONARY ANGIOGRAPHY;  Surgeon: Jettie Booze, MD;  Location: [facility];  Service: Cardiovascular;  Laterality: N/A;  . MELANOMA EXCISION      "off my back"  . NASAL SINUS SURGERY    . TONSILLECTOMY       OB History   No obstetric history on file.      Home Medications    Prior to Admission medications   Medication Sig Start Date End Date Taking? Authorizing Provider  acetaminophen (TYLENOL) 500 MG tablet Take 500 mg by mouth every 6 (six) hours as needed for mild pain.     [provider]  Alpha-Lipoic Acid (LIPOIC ACID PO) Take 2 tablets by mouth 2 (two) times daily.     [provider]  atorvastatin (LIPITOR) 10 MG tablet TAKE 1 TABLET BY MOUTH EVERY DAY 06/27/18   Adrian Prows, MD  Calcium Carbonate-Vitamin D3 600-400 MG-UNIT TABS Take 1 tablet by mouth 2 (two) times a day.     [provider]  CALCIUM/MAGNESIUM/ZINC FORMULA PO Take 1 tablet by mouth daily.    [provider]  cetirizine (ZYRTEC) 10 MG tablet Take 10 mg by mouth at bedtime.    [provider]  cholecalciferol (VITAMIN D) 1000 UNITS tablet Take 1,000 Units by mouth daily.    [provider]  CRANBERRY CONCENTRATE PO Take 650 mg by mouth 2 (two) times a day.     [provider]  cycloSPORINE (RESTASIS) 0.05 % ophthalmic emulsion Place 1 drop into both eyes as needed.     [provider]  diltiazem (CARDIZEM CD) 120 MG 24 hr capsule TAKE 1 CAPSULE BY MOUTH DAILY *NEED OFFICE VISIT FOR REFILLS* Patient taking differently: Take 120 mg by mouth daily.  06/27/18   Adrian Prows, MD  DULoxetine (CYMBALTA) 30 MG capsule TAKE 1 CAPSULE BY MOUTH TWICE A DAY 09/15/18   Sater, Nanine Means, MD  ELIQUIS 5 MG TABS tablet TAKE 1 TABLET BY MOUTH TWICE A DAY 08/16/18   Miquel Dunn, NP  GLUCOSAMINE-CHONDROITIN PO Take 2 tablets by mouth daily with lunch. Glucosamine 1500mg  Chondroitin 1200mg     [provider]  Lutein-Zeaxanthin 25-5 MG CAPS Take 1 tablet by mouth daily.    [provider]  Melatonin 5 MG TABS Take 3-5 mg by mouth at bedtime.     [provider]  Multiple Vitamin  (MULTIVITAMIN WITH MINERALS) TABS tablet Take 1 tablet by mouth daily.    [provider]  nitrofurantoin (MACRODANTIN) 50 MG capsule Take 50 mg by mouth daily.  06/08/18   [provider]  nitroGLYCERIN (NITROLINGUAL) 0.4 MG/SPRAY spray Place 1 spray under the tongue every 5 (five) minutes x 3 doses as needed for chest pain.    [provider]  Omega-3 1000 MG CAPS Take 1,000 mg by mouth daily.    [provider]  pantoprazole (PROTONIX) 40 MG tablet Take 40 mg by mouth daily before breakfast.  06/12/14   [provider]  simethicone (MYLICON) 80 MG chewable tablet Chew 80 mg by mouth every 6 (six) hours as needed for flatulence.    [provider]  sucralfate (  CARAFATE) 1 g tablet Take 1 g by mouth 4 (four) times daily -  with meals and at bedtime.    [provider]  thyroid (ARMOUR) 60 MG tablet Take 60 mg by mouth daily before breakfast. 11/07/17   [provider]  tiZANidine (ZANAFLEX) 4 MG tablet TAKE 1 TABLET BY MOUTH THREE TIMES A DAY AS NEEDED 08/29/18   Sater, Nanine Means, MD  vitamin C (ASCORBIC ACID) 500 MG tablet Take 1,000 mg by mouth daily.     [provider]    Family History Family History  Problem Relation Age of Onset  . Congestive Heart Failure Mother   . Heart disease Father        History of heart attacks at a later age.      Social History Social History   Tobacco Use  . Smoking status: Former Smoker    Years: 2.00    Types: Cigarettes    Last attempt to quit: 1980    Years since quitting: 40.4  . Smokeless tobacco: Never Used  . Tobacco comment: 11/10/2017 "only smoked when I was on my period"  Substance Use Topics  . Alcohol use: Yes    Comment: 11/10/2017 "nothing lately; did have 1 glass of wine q night"  . Drug use: Never     Allergies   Avelox  [moxifloxacin hcl in nacl] and Alendronate sodium   Review of Systems Review of Systems  Neurological: Positive for dizziness.   All other systems reviewed and are negative.    Physical Exam Updated Vital Signs BP 114/65 (BP Location: Right Arm)   Pulse 62   Temp 97.8 F (36.6 C) (Oral)   Resp 16   SpO2 97%   Physical Exam Vitals signs and nursing note reviewed.  Constitutional:      General: She is not in acute distress.    Appearance: She is well-developed.  HENT:     Head: Atraumatic.  Eyes:     Extraocular Movements: Extraocular movements intact.     Conjunctiva/sclera: Conjunctivae normal.     Pupils: Pupils are equal, round, and reactive to light.  Neck:     Musculoskeletal: Neck supple.  Cardiovascular:     Rate and Rhythm: Normal rate and regular rhythm.     Heart sounds: No murmur.  Pulmonary:     Effort: Pulmonary effort is normal.     Breath sounds: Normal breath sounds. No wheezing, rhonchi or rales.  Abdominal:     Palpations: Abdomen is soft.     Tenderness: There is no abdominal tenderness.  Genitourinary:    Comments: Chaperone present during exam.  Normal rectal tone, no obvious mass, no gross blood on glove.  Normal color stool. Musculoskeletal:        General: No tenderness.  Skin:    Capillary Refill: Capillary refill takes less than 2 seconds.     Coloration: Skin is pale.     Findings: No rash.  Neurological:     Mental Status: She is alert and oriented to person, place, and time.  Psychiatric:        Mood and Affect: Mood normal.      ED Treatments / Results  Labs (all labs ordered are listed, but only abnormal results are displayed) Labs Reviewed  BASIC METABOLIC PANEL - Abnormal; Notable for the following components:      Result Value   Glucose, Bld 104 (*)    Creatinine, Ser 1.15 (*)    GFR calc non  Af Amer 46 (*)    GFR calc Af Amer 54 (*)    All other components within normal limits  CBC WITH DIFFERENTIAL/PLATELET - Abnormal; Notable for the following components:   RBC 3.78 (*)    Hemoglobin 11.4 (*)    HCT 35.5 (*)    All other components within  normal limits  URINALYSIS, ROUTINE W REFLEX MICROSCOPIC - Abnormal; Notable for the following components:   APPearance HAZY (*)    Leukocytes,Ua LARGE (*)    Bacteria, UA RARE (*)    All other components within normal limits  CBG MONITORING, ED - Abnormal; Notable for the following components:   Glucose-Capillary 104 (*)    All other components within normal limits  URINE CULTURE  POC OCCULT BLOOD, ED  TYPE AND SCREEN    EKG EKG Interpretation  Date/Time:  Wednesday September 21 2018 17:41:04 EDT Ventricular Rate:  61 PR Interval:    QRS Duration: 121 QT Interval:  522 QTC Calculation: 526 R Axis:   -66 Text Interpretation:  Sinus rhythm Nonspecific IVCD with LAD Nonspecific T abnormalities, diffuse leads since last tracing no significant change Confirmed by Daleen Bo [phone]) on 09/21/2018 7:12:30 PM   Radiology No results found.  Procedures Procedures (including critical care time)  Medications Ordered in ED Medications  sodium chloride 0.9 % bolus 1,000 mL (1,000 mLs Intravenous New Bag/Given 09/21/18 1840)    And  0.9 %  sodium chloride infusion ( Intravenous New Bag/Given 09/21/18 1950)     Initial Impression / Assessment and Plan / ED Course  I have reviewed the triage vital signs and the nursing notes.  Pertinent labs & imaging results that were available during my care of the patient were reviewed by me and considered in my medical decision making (see chart for details).        BP (!) 143/82   Pulse 71   Temp 97.8 F (36.6 C) (Oral)   Resp (!) 23   SpO2 95%    Final Clinical Impressions(s) / ED Diagnoses   Final diagnoses:  Lower urinary tract infectious disease  Dehydration    ED Discharge Orders         Ordered    cephALEXin (KEFLEX) 500 MG capsule  4 times daily     09/21/18 2116         6:33 PM Patient here with complaints of lightheadedness dizziness with near syncopal episode.  It was noted that she was hypotensive on initial exam  by EMS and received IV fluid which has helped.  Work-up initiated.  Care discussed with DR. Eulis Foster.   8:36 PM Labs are reassuring, no significant electrolyte derangement, normal H&H, normal WBC, fecal occult blood test is negative, normal CBG, EKG without concerning arrhythmia.  Normal orthostatic vital sign.  8:51 PM UA with evidence of urinary tract infection.  Urine culture sent.  Patient will treated with Keflex.  Patient felt much better and would like to go home.  Encourage patient to take antibiotic as prescribed.  Return precaution discussed.   Domenic Moras, PA-C 09/21/18 2121    Daleen Bo, MD 09/23/18 2210

## 2018-09-21 NOTE — Discharge Instructions (Signed)
Your condition is likely due to dehydration.  Drink adequate fluid daily.  Your urine showing evidence of a urinary tract infection.  Take antibiotic as prescribed.  Follow up closely with your doctor for further care.

## 2018-09-21 NOTE — ED Triage Notes (Signed)
Pt here for dizzy spells x 1 year-one today with episode of hypotension (74/54). 500 NS bolus given by EMS, with improvement to 102/52 and relief of dizziness. Pt oriented to self, situation, month.

## 2018-09-21 NOTE — ED Notes (Signed)
Patient verbalizes understanding of discharge instructions. Opportunity for questioning and answers were provided. Armband removed by staff, pt discharged from ED ambulatory to home.  

## 2018-09-21 NOTE — ED Provider Notes (Signed)
  Face-to-face evaluation   History: She was at home when she felt near-syncopal.  EMS found her to be hypotensive.  They gave her IV saline, bolus, with improvement of her blood pressure.  She denies recent vomiting, diarrhea, polyuria, dizziness or weakness.  Physical exam: Elderly, alert and cooperative.  Heart regular rate and rhythm without murmur, lungs clear to auscultation.  Romberg negative.  Medical screening examination/treatment/procedure(s) were conducted as a shared visit with non-physician practitioner(s) and myself.  I personally evaluated the patient during the encounter    Daleen Bo, MD 09/23/18 2210

## 2018-09-22 ENCOUNTER — Ambulatory Visit: Payer: Medicare PPO | Admitting: Orthotics

## 2018-09-22 ENCOUNTER — Telehealth: Payer: Self-pay | Admitting: Cardiology

## 2018-09-22 DIAGNOSIS — M898X9 Other specified disorders of bone, unspecified site: Secondary | ICD-10-CM

## 2018-09-22 NOTE — Progress Notes (Signed)
Patient seen today, yet offload was in wrong place.

## 2018-09-22 NOTE — Telephone Encounter (Signed)
She had UTI and that is why she had low BP.  She has been having recurrent UTI, should discuss with PCP the reason and maybe see a urologist

## 2018-09-23 LAB — URINE CULTURE

## 2018-10-06 ENCOUNTER — Other Ambulatory Visit: Payer: Self-pay

## 2018-10-06 ENCOUNTER — Ambulatory Visit (INDEPENDENT_AMBULATORY_CARE_PROVIDER_SITE_OTHER): Payer: Medicare PPO | Admitting: Orthotics

## 2018-10-06 DIAGNOSIS — M2141 Flat foot [pes planus] (acquired), right foot: Secondary | ICD-10-CM

## 2018-10-06 DIAGNOSIS — M2142 Flat foot [pes planus] (acquired), left foot: Secondary | ICD-10-CM

## 2018-10-06 DIAGNOSIS — M898X9 Other specified disorders of bone, unspecified site: Secondary | ICD-10-CM

## 2018-10-06 NOTE — Progress Notes (Signed)
Patient came in today to pick up custom made foot orthotics.  The goals were accomplished and the patient reported no dissatisfaction with said orthotics.  Patient was advised of breakin period and how to report any issues. 

## 2018-10-11 ENCOUNTER — Telehealth: Payer: Self-pay | Admitting: Podiatry

## 2018-10-11 NOTE — Telephone Encounter (Signed)
pts husband called last week stating he called [employer] and they said that orthotics could be possibly covered but it needed authorization. I explained that they follow same guidelines as medicare and they are not covered by medicare.  I did call [employer] and they gave me a retro approval notification for orthotics(l3020 X2). I had billing dept attach to claim and resend it.  I notified pts husband that I was not able to get a authorization because the code does not need it but I did get a notification and we have resubmitted it but there is no guarantee  Of coverage since they follow same guidelines as medicare. Pts husband said thenk you for my efforts.

## 2018-10-17 ENCOUNTER — Telehealth: Payer: Self-pay

## 2018-10-17 NOTE — Telephone Encounter (Signed)
Not sure about that. Happy to see or do an EKG

## 2018-10-17 NOTE — Telephone Encounter (Signed)
Pt called and said that she is dizzy and speech is getting slurred and its getting wore, they think its from her AFIB. BP is 112/75 pulse 77

## 2018-10-18 DIAGNOSIS — G4733 Obstructive sleep apnea (adult) (pediatric): Secondary | ICD-10-CM | POA: Diagnosis not present

## 2018-10-18 NOTE — Telephone Encounter (Signed)
Lvm for call back.

## 2018-10-18 NOTE — Telephone Encounter (Signed)
Pt will come for EKG tomorrow

## 2018-10-19 ENCOUNTER — Ambulatory Visit (INDEPENDENT_AMBULATORY_CARE_PROVIDER_SITE_OTHER): Payer: Medicare PPO | Admitting: Cardiology

## 2018-10-19 ENCOUNTER — Other Ambulatory Visit: Payer: Self-pay

## 2018-10-19 DIAGNOSIS — I480 Paroxysmal atrial fibrillation: Secondary | ICD-10-CM

## 2018-10-19 NOTE — Progress Notes (Signed)
Patient had called [place] about dizziness and thought that she was in A. fib, made the patient come in for EKG.  Patient was made aware of the EKG findings that she is in sinus rhythm.

## 2018-10-25 ENCOUNTER — Telehealth: Payer: Self-pay

## 2018-10-25 NOTE — Telephone Encounter (Signed)
Do not change anything unless bleeding persists

## 2018-10-25 NOTE — Telephone Encounter (Signed)
Pt called stating that she fell on Friday 7/10 and hasnt notice any bruising until today below her left knee and slight swelling on her left cheek. Questioning if she should reduce her eliquis that she takes bid. Did not have to go to ED.  Please advise.//ah

## 2018-10-26 NOTE — Telephone Encounter (Signed)
Pt aware.//ah

## 2018-10-28 DIAGNOSIS — L03116 Cellulitis of left lower limb: Secondary | ICD-10-CM | POA: Diagnosis not present

## 2018-11-01 ENCOUNTER — Other Ambulatory Visit: Payer: Self-pay | Admitting: Cardiology

## 2018-11-01 DIAGNOSIS — I480 Paroxysmal atrial fibrillation: Secondary | ICD-10-CM

## 2018-11-02 DIAGNOSIS — L03116 Cellulitis of left lower limb: Secondary | ICD-10-CM | POA: Diagnosis not present

## 2018-11-02 DIAGNOSIS — R350 Frequency of micturition: Secondary | ICD-10-CM | POA: Diagnosis not present

## 2018-11-02 DIAGNOSIS — R600 Localized edema: Secondary | ICD-10-CM | POA: Diagnosis not present

## 2018-11-03 DIAGNOSIS — R600 Localized edema: Secondary | ICD-10-CM | POA: Diagnosis not present

## 2018-11-03 DIAGNOSIS — S8992XA Unspecified injury of left lower leg, initial encounter: Secondary | ICD-10-CM | POA: Diagnosis not present

## 2018-11-10 ENCOUNTER — Other Ambulatory Visit: Payer: Self-pay | Admitting: Cardiology

## 2018-11-10 ENCOUNTER — Other Ambulatory Visit: Payer: Self-pay | Admitting: Neurology

## 2018-11-10 DIAGNOSIS — E782 Mixed hyperlipidemia: Secondary | ICD-10-CM

## 2018-11-18 DIAGNOSIS — G4733 Obstructive sleep apnea (adult) (pediatric): Secondary | ICD-10-CM | POA: Diagnosis not present

## 2018-11-22 DIAGNOSIS — M25572 Pain in left ankle and joints of left foot: Secondary | ICD-10-CM | POA: Diagnosis not present

## 2018-11-22 DIAGNOSIS — R0789 Other chest pain: Secondary | ICD-10-CM | POA: Diagnosis not present

## 2018-11-22 DIAGNOSIS — R079 Chest pain, unspecified: Secondary | ICD-10-CM | POA: Diagnosis not present

## 2018-11-22 DIAGNOSIS — N939 Abnormal uterine and vaginal bleeding, unspecified: Secondary | ICD-10-CM | POA: Diagnosis not present

## 2018-11-24 DIAGNOSIS — N390 Urinary tract infection, site not specified: Secondary | ICD-10-CM | POA: Diagnosis not present

## 2018-11-29 ENCOUNTER — Other Ambulatory Visit: Payer: Self-pay

## 2018-11-29 ENCOUNTER — Encounter: Payer: Self-pay | Admitting: Family Medicine

## 2018-11-29 ENCOUNTER — Ambulatory Visit (INDEPENDENT_AMBULATORY_CARE_PROVIDER_SITE_OTHER): Payer: Medicare PPO | Admitting: Family Medicine

## 2018-11-29 ENCOUNTER — Telehealth: Payer: Self-pay

## 2018-11-29 VITALS — BP 83/54 | HR 74 | Temp 97.3°F | Ht 65.0 in | Wt 158.8 lb

## 2018-11-29 DIAGNOSIS — I480 Paroxysmal atrial fibrillation: Secondary | ICD-10-CM

## 2018-11-29 DIAGNOSIS — G629 Polyneuropathy, unspecified: Secondary | ICD-10-CM

## 2018-11-29 DIAGNOSIS — G4733 Obstructive sleep apnea (adult) (pediatric): Secondary | ICD-10-CM

## 2018-11-29 DIAGNOSIS — R42 Dizziness and giddiness: Secondary | ICD-10-CM | POA: Diagnosis not present

## 2018-11-29 DIAGNOSIS — R413 Other amnesia: Secondary | ICD-10-CM | POA: Diagnosis not present

## 2018-11-29 NOTE — Patient Instructions (Signed)
  Restart Alpha Liprioc Acid for tingling  Exercise as tolerated (after evaluation from cardiology)  Memory Compensation Strategies  1. Use "WARM" strategy.  W= write it down  A= associate it  R= repeat it  M= make a mental note  2.   You can keep a Social worker.  Use a 3-ring notebook with sections for the following: calendar, important names and phone numbers,  medications, doctors' names/phone numbers, lists/reminders, and a section to journal what you did  each day.   3.    Use a calendar to write appointments down.  4.    Write yourself a schedule for the day.  This can be placed on the calendar or in a separate section of the Memory Notebook.  Keeping a  regular schedule can help memory.  5.    Use medication organizer with sections for each day or morning/evening pills.  You may need help loading it  6.    Keep a basket, or pegboard by the door.  Place items that you need to take out with you in the basket or on the pegboard.  You may also want to  include a message board for reminders.  7.    Use sticky notes.  Place sticky notes with reminders in a place where the task is performed.  For example: " turn off the  stove" placed by the stove, "lock the door" placed on the door at eye level, " take your medications" on  the bathroom mirror or by the place where you normally take your medications.  8.    Use alarms/timers.  Use while cooking to remind yourself to check on food or as a reminder to take your medicine, or as a  reminder to make a call, or as a reminder to perform another task, etc.
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
## 2018-11-29 NOTE — Telephone Encounter (Signed)
NP called to talk to [facility] about pt OV but she states that she will send him a message

## 2018-11-30 ENCOUNTER — Encounter: Payer: Self-pay | Admitting: Family Medicine

## 2018-11-30 NOTE — Progress Notes (Signed)
I have read the note, and I agree with the clinical assessment and plan.  Ismael Treptow A. Nicolas Sisler, MD, PhD, FAAN Certified in Neurology, Clinical Neurophysiology, Sleep Medicine, Pain Medicine and Neuroimaging  [facility] [address], [address] [place], [place] [address] [phone]  

## 2018-12-05 DIAGNOSIS — Z23 Encounter for immunization: Secondary | ICD-10-CM | POA: Diagnosis not present

## 2018-12-05 DIAGNOSIS — N3000 Acute cystitis without hematuria: Secondary | ICD-10-CM | POA: Diagnosis not present

## 2018-12-05 DIAGNOSIS — R300 Dysuria: Secondary | ICD-10-CM | POA: Diagnosis not present

## 2018-12-07 ENCOUNTER — Encounter: Payer: Self-pay | Admitting: Cardiology

## 2018-12-07 ENCOUNTER — Other Ambulatory Visit: Payer: Self-pay

## 2018-12-07 ENCOUNTER — Ambulatory Visit (INDEPENDENT_AMBULATORY_CARE_PROVIDER_SITE_OTHER): Payer: Medicare PPO | Admitting: Cardiology

## 2018-12-07 VITALS — BP 157/80 | HR 75 | Temp 98.4°F | Ht 65.0 in | Wt 169.0 lb

## 2018-12-07 DIAGNOSIS — I480 Paroxysmal atrial fibrillation: Secondary | ICD-10-CM | POA: Diagnosis not present

## 2018-12-07 DIAGNOSIS — G4733 Obstructive sleep apnea (adult) (pediatric): Secondary | ICD-10-CM | POA: Diagnosis not present

## 2018-12-07 DIAGNOSIS — I951 Orthostatic hypotension: Secondary | ICD-10-CM | POA: Diagnosis not present

## 2018-12-07 DIAGNOSIS — R0789 Other chest pain: Secondary | ICD-10-CM | POA: Diagnosis not present

## 2018-12-07 NOTE — Progress Notes (Signed)
Primary Physician/Referring:  Deland Pretty, MD  [name], female    DOB: 01/17/1941, 78 y.o.   MRN: [id number]  Chief Complaint  Patient presents with  . Chest Pain    subbsternal    HPI: Cynthia [name]  is a 78 y.o. female  with 78 year old Caucasian female with hypertension, hyperlipidemia, frequent UTI,  paroxysmal atrial fibrillation, atrial tachycardia, obstructive sleep apnea on CPAP, referred back to me  by her PCP who saw her on 11/02/2018 when she had complained of chest discomfort.  EKG was nonischemic and in sinus rhythm at that time.  On her office visit on 05/01/2018,flecainide was discontinued due to lightheadedness and dizziness and this seems to have improved.  She was not orthostatic on her last office visit.   Past Medical History:  Diagnosis Date  . Acute blood loss anemia 10/27/2017  . Acute GI bleeding 10/27/2017  . Anemia   . Arthritis    "joints" (11/10/2017)  . Dizziness   . GERD (gastroesophageal reflux disease)   . GI bleed 11/06/2017  . Hearing loss   . History of blood transfusion 10/2017  . Hyperlipidemia   . Hyperthyroidism   . Melanoma ([place])    "cut off my back"  . Memory loss   . Sinus headache   . Sleep apnea     Past Surgical History:  Procedure Laterality Date  . ABDOMINAL HYSTERECTOMY     "partial"  . APPENDECTOMY    . BIOPSY  11/17/2017   Procedure: BIOPSY;  Surgeon: Clarene Essex, MD;  Location: [facility];  Service: Endoscopy;;  . COLONOSCOPY WITH PROPOFOL N/A 11/17/2017   Procedure: COLONOSCOPY WITH PROPOFOL;  Surgeon: Clarene Essex, MD;  Location: [facility];  Service: Endoscopy;  Laterality: N/A;  . CRANIECTOMY FOR EXCISION OF ACOUSTIC NEUROMA    . ESOPHAGOGASTRODUODENOSCOPY (EGD) WITH PROPOFOL N/A 10/28/2017   Procedure: ESOPHAGOGASTRODUODENOSCOPY (EGD) WITH PROPOFOL;  Surgeon: Clarene Essex, MD;  Location: [facility];  Service: Endoscopy;  Laterality: N/A;  . KNEE ARTHROSCOPY Left   . LEFT HEART CATH AND CORONARY  ANGIOGRAPHY N/A 11/24/2016   Procedure: LEFT HEART CATH AND CORONARY ANGIOGRAPHY;  Surgeon: Jettie Booze, MD;  Location: [facility];  Service: Cardiovascular;  Laterality: N/A;  . MELANOMA EXCISION     "off my back"  . NASAL SINUS SURGERY    . TONSILLECTOMY      Social History   Socioeconomic History  . Marital status: Married    Spouse name: Not on file  . Number of children: 2  . Years of education: Not on file  . Highest education level: Not on file  Occupational History  . Not on file  Social Needs  . Financial resource strain: Not on file  . Food insecurity    Worry: Not on file    Inability: Not on file  . Transportation needs    Medical: Not on file    Non-medical: Not on file  Tobacco Use  . Smoking status: Former Smoker    Years: 2.00    Types: Cigarettes    Quit date: 1980    Years since quitting: 40.6  . Smokeless tobacco: Never Used  . Tobacco comment: 11/10/2017 "only smoked when I was on my period"  Substance and Sexual Activity  . Alcohol use: Yes    Comment: 11/10/2017 "nothing lately; did have 1 glass of wine q night"  . Drug use: Never  . Sexual activity: Not Currently  Lifestyle  .  Physical activity    Days per week: Not on file    Minutes per session: Not on file  . Stress: Not on file  Relationships  . Social Herbalist on phone: Not on file    Gets together: Not on file    Attends religious service: Not on file    Active member of club or organization: Not on file    Attends meetings of clubs or organizations: Not on file    Relationship status: Not on file  . Intimate partner violence    Fear of current or ex partner: Not on file    Emotionally abused: Not on file    Physically abused: Not on file    Forced sexual activity: Not on file  Other Topics Concern  . Not on file  Social History Narrative   Right handed     Current Outpatient Medications on File Prior to Visit  Medication Sig Dispense Refill  .  acetaminophen (TYLENOL) 500 MG tablet Take 500 mg by mouth every 6 (six) hours as needed for mild pain.     . Alpha-Lipoic Acid (LIPOIC ACID PO) Take 2 tablets by mouth 2 (two) times daily.     Marland Kitchen atorvastatin (LIPITOR) 10 MG tablet TAKE 1 TABLET BY MOUTH EVERY DAY 30 tablet 5  . Calcium Carbonate-Vitamin D3 600-400 MG-UNIT TABS Take 1 tablet by mouth 2 (two) times a day.     . cephALEXin (KEFLEX) 500 MG capsule Take 1 capsule (500 mg total) by mouth 4 (four) times daily. 40 capsule 0  . cholecalciferol (VITAMIN D) 1000 UNITS tablet Take 1,000 Units by mouth daily.    Marland Kitchen diltiazem (CARDIZEM CD) 120 MG 24 hr capsule TAKE 1 CAPSULE BY MOUTH DAILY *NEED OFFICE VISIT FOR REFILLS* (Patient taking differently: Take 120 mg by mouth every other day. "Taking every otherday") 90 capsule 1  . doxycycline (VIBRAMYCIN) 100 MG capsule TAKE 1 CAPSULE BY MOUTH TWICE A DAY FOR 7 DAYS    . DULoxetine (CYMBALTA) 30 MG capsule TAKE 1 CAPSULE BY MOUTH TWICE A DAY 180 capsule 3  . ELIQUIS 5 MG TABS tablet TAKE 1 TABLET BY MOUTH TWICE A DAY 180 tablet 1  . GLUCOSAMINE-CHONDROITIN PO Take 2 tablets by mouth daily with lunch. Glucosamine 1551m Chondroitin 12032m   . Lutein-Zeaxanthin 25-5 MG CAPS Take 1 tablet by mouth daily.    . Melatonin 5 MG TABS Take 3-5 mg by mouth at bedtime.     . mirabegron ER (MYRBETRIQ) 50 MG TB24 tablet Take 50 mg by mouth daily.    . Multiple Vitamin (MULTIVITAMIN WITH MINERALS) TABS tablet Take 1 tablet by mouth daily.    . nitroGLYCERIN (NITROLINGUAL) 0.4 MG/SPRAY spray Place 1 spray under the tongue every 5 (five) minutes x 3 doses as needed for chest pain.    . Omega-3 1000 MG CAPS Take 1,000 mg by mouth daily.    . pantoprazole (PROTONIX) 40 MG tablet Take 40 mg by mouth daily before breakfast.   1  . simethicone (MYLICON) 80 MG chewable tablet Chew 80 mg by mouth every 6 (six) hours as needed for flatulence.    . sucralfate (CARAFATE) 1 g tablet Take 1 g by mouth 4 (four) times daily -   with meals and at bedtime. "3 times daily"    . thyroid (ARMOUR) 60 MG tablet Take 60 mg by mouth daily before breakfast.    . tiZANidine (ZANAFLEX) 4 MG tablet TAKE 1 TABLET  BY MOUTH THREE TIMES A DAY AS NEEDED (Patient taking differently: Take 4 mg by mouth 3 (three) times daily as needed for muscle spasms. ) 90 tablet 5  . triamterene-hydrochlorothiazide (MAXZIDE-25) 37.5-25 MG tablet TAKE 1 TABLET BY MOUTH EVERY DAY IN THE MORNING AS NEEDED FOR EDEMA    . vitamin C (ASCORBIC ACID) 500 MG tablet Take 1,000 mg by mouth daily.      No current facility-administered medications on file prior to visit.     Review of Systems  Constitution: Positive for malaise/fatigue. Negative for decreased appetite, weight gain and weight loss.  HENT: Negative for congestion.   Eyes: Negative for visual disturbance.  Cardiovascular: Positive for chest pain (occasional sharp pain). Negative for dyspnea on exertion, leg swelling, palpitations and syncope.  Respiratory: Negative for shortness of breath.   Endocrine: Negative for cold intolerance.  Hematologic/Lymphatic: Does not bruise/bleed easily.  Skin: Negative for itching and rash.  Musculoskeletal: Negative for falls and myalgias.  Gastrointestinal: Negative for abdominal pain, nausea and vomiting.  Genitourinary: Negative for dysuria.  Neurological: Positive for dizziness and light-headedness. Negative for weakness.  Psychiatric/Behavioral: The patient is not nervous/anxious.   All other systems reviewed and are negative.  Objective:  Blood pressure (!) 157/80, pulse 75, temperature 98.4 F (36.9 C), height '5\' 5"'  (1.651 m), weight 169 lb (76.7 kg), SpO2 98 %. Body mass index is 28.12 kg/m.  Physical Exam  Constitutional: She is oriented to person, place, and time. She appears well-developed and well-nourished. No distress.  HENT:  Head: Normocephalic and atraumatic.  Eyes: Pupils are equal, round, and reactive to light. Conjunctivae are normal.   Neck: No JVD present.  Cardiovascular: Normal rate, regular rhythm and intact distal pulses.  No murmur heard. Pulmonary/Chest: Effort normal and breath sounds normal. She has no wheezes. She has no rales.  Abdominal: Soft. Bowel sounds are normal. There is no rebound.  Musculoskeletal:        General: No edema.     Comments: Left foot dorsum has bony protrusion  Neurological: She is alert and oriented to person, place, and time. No cranial nerve deficit.  Skin: Skin is warm and dry.  Psychiatric: She has a normal mood and affect.  Nursing note and vitals reviewed.  Radiology: No results found. Laboratory Examination:   Labs 11/02/2018: BUN 26, creatinine 0.9, EGFR greater than 60 well, Hb 10.6/HCT 32.2, platelets 244.   CMP Latest Ref Rng & Units 09/21/2018 06/20/2018 03/28/2018  Glucose 70 - 99 mg/dL 104(H) 134(H) 104(H)  BUN 8 - 23 mg/dL '21 23 11  ' Creatinine 0.44 - 1.00 mg/dL 1.15(H) 1.01(H) 0.63  Sodium 135 - 145 mmol/L 140 139 141  Potassium 3.5 - 5.1 mmol/L 3.8 2.8(L) 3.6  Chloride 98 - 111 mmol/L 103 101 107  CO2 22 - 32 mmol/L '26 24 24  ' Calcium 8.9 - 10.3 mg/dL 9.1 9.6 8.8(L)  Total Protein 6.5 - 8.1 g/dL - - 5.9(L)  Total Bilirubin 0.3 - 1.2 mg/dL - - 0.5  Alkaline Phos 38 - 126 U/L - - 46  AST 15 - 41 U/L - - 22  ALT 0 - 44 U/L - - 21   CBC Latest Ref Rng & Units 09/21/2018 06/20/2018 03/27/2018  WBC 4.0 - 10.5 K/uL 9.2 12.3(H) 4.7  Hemoglobin 12.0 - 15.0 g/dL 11.4(L) 12.6 10.6(L)  Hematocrit 36.0 - 46.0 % 35.5(L) 40.0 33.0(L)  Platelets 150 - 400 K/uL 198 210 166   Lipid Panel     Component Value  Date/Time   CHOL 165 11/24/2016 0040   TRIG 88 11/24/2016 0040   HDL 61 11/24/2016 0040   CHOLHDL 2.7 11/24/2016 0040   VLDL 18 11/24/2016 0040   LDLCALC 86 11/24/2016 0040   HEMOGLOBIN A1C Lab Results  Component Value Date   HGBA1C 5.6 11/24/2016   MPG 114 11/24/2016   TSH Recent Labs    03/27/18 0909  TSH 1.230    Cardiac studies:   Event Monitor for 30  days Start date 07/01/2018 for syncope : A. Fib one episode, no symptoms reported. No heart block or significant bradycardia  Carotid duplex 03/27/2018:  Right Carotid: Velocities in the right ICA are consistent with a 1-39% stenosis. Left Carotid: Velocities in the left ICA are consistent with a 1-39% stenosis. Vertebrals:  Bilateral vertebral arteries demonstrate antegrade flow. Subclavians: Normal flow hemodynamics were seen in bilateral subclavian arteries.   Event Monitor 30 days 11/11/2017: Predominant rhythm is normal sinus rhythm. Symptoms of fatigue reveals normal sinus rhythm. Asymptomatic atrial fibrillation and probable atypical atrial flutter noted on 11/13/2017, 11/18/2017 and 12/09/2017 at 3:00 AM. Occasional PACs. Arrhythmia/PVC burden 6%.   Echo 11/09/2017: Left ventricle: The cavity size was normal. Systolic function was   normal. The estimated ejection fraction was in the range of 60% to 65%. Wall motion was normal; there were no regional wall   motion abnormalities. The study is not technically sufficient to   allow evaluation of LV diastolic function.Pulmonary arteries: PA peak pressure: 32 mm Hg (S).   Coronary angiogram 11/24/2016:  Normal coronaries; tortuous LAD  Assessment:      ICD-10-CM   1. Non-cardiac chest pain  R07.89 EKG 12-Lead  2. Paroxysmal atrial fibrillation (HCC)  I48.0 EKG 12-Lead  3. Orthostatic hypotension  I95.1   4. OSA (obstructive sleep apnea)  G47.33     EKG 12/07/2018: Normal sinus rhythm at rate of 75 bpm, left axis deviation, left anterior fascicular block. IRBBB.  Nonspecific T abnormality. No significant change from  06/22/2018  EKG 06/20/2018: Atrial tachycardia at the rate of 133 bpm.  Left axis deviation, left anterior fascicular block.  Poor R-wave progression.  Incomplete right bundle branch block.  Recommendations:    Patient presents here for a acute visit for chest pain, chest pain symptoms are clearly at most atypical that lasted for  2-3 minutes and no association with exertion activity that occurred 2 weeks ago with no recurrence.  She has resumed normal activity.  Her blood pressure is elevated today, however I did not make any changes to her medications as she does have borderline low blood pressure and also orthostatic hypotension.  Also they have been checking blood pressure at home and it is very well controlled.  She has been compliant with CPAP. With regard to paroxysmal atrial fibrillation, she has had only brief episodes, being on flecainide her symptoms of orthostasis been very worse and hence would not recommend antiarrhythmic therapy.  If she has more frequent episodes of atrial fibrillation be could consider amiodarone.  I discussed with her that she can have episodes of atrial fibrillation that she should be on long-term anticoagulation.  Husband is present, all questions answered.    Adrian Prows, MD, [facility] 12/07/2018, 10:05 PM [facility] [place] Pager: [phone] Office: [phone] If no answer Cell [phone]

## 2018-12-14 ENCOUNTER — Other Ambulatory Visit: Payer: Self-pay

## 2018-12-14 ENCOUNTER — Encounter (HOSPITAL_COMMUNITY): Payer: Self-pay

## 2018-12-14 ENCOUNTER — Emergency Department (HOSPITAL_COMMUNITY): Payer: Medicare PPO

## 2018-12-14 ENCOUNTER — Emergency Department (HOSPITAL_COMMUNITY)
Admission: EM | Admit: 2018-12-14 | Discharge: 2018-12-15 | Disposition: A | Discharge status: Home / Self Care | Payer: Medicare PPO | Attending: Emergency Medicine | Admitting: Emergency Medicine

## 2018-12-14 DIAGNOSIS — D649 Anemia, unspecified: Secondary | ICD-10-CM | POA: Insufficient documentation

## 2018-12-14 DIAGNOSIS — E079 Disorder of thyroid, unspecified: Secondary | ICD-10-CM | POA: Diagnosis not present

## 2018-12-14 DIAGNOSIS — R0902 Hypoxemia: Secondary | ICD-10-CM | POA: Diagnosis not present

## 2018-12-14 DIAGNOSIS — Z79899 Other long term (current) drug therapy: Secondary | ICD-10-CM | POA: Insufficient documentation

## 2018-12-14 DIAGNOSIS — R4182 Altered mental status, unspecified: Secondary | ICD-10-CM | POA: Diagnosis not present

## 2018-12-14 DIAGNOSIS — R4020 Unspecified coma: Secondary | ICD-10-CM | POA: Diagnosis not present

## 2018-12-14 DIAGNOSIS — Z85828 Personal history of other malignant neoplasm of skin: Secondary | ICD-10-CM | POA: Diagnosis not present

## 2018-12-14 DIAGNOSIS — R001 Bradycardia, unspecified: Secondary | ICD-10-CM

## 2018-12-14 DIAGNOSIS — Z87891 Personal history of nicotine dependence: Secondary | ICD-10-CM | POA: Diagnosis not present

## 2018-12-14 DIAGNOSIS — I4891 Unspecified atrial fibrillation: Secondary | ICD-10-CM | POA: Insufficient documentation

## 2018-12-14 DIAGNOSIS — R0989 Other specified symptoms and signs involving the circulatory and respiratory systems: Secondary | ICD-10-CM | POA: Diagnosis not present

## 2018-12-14 DIAGNOSIS — I959 Hypotension, unspecified: Secondary | ICD-10-CM | POA: Diagnosis not present

## 2018-12-14 LAB — COMPREHENSIVE METABOLIC PANEL
ALT: 15 U/L (ref 0–44)
AST: 27 U/L (ref 15–41)
Albumin: 3.3 g/dL — ABNORMAL LOW (ref 3.5–5.0)
Alkaline Phosphatase: 47 U/L (ref 38–126)
Anion gap: 8 (ref 5–15)
BUN: 21 mg/dL (ref 8–23)
CO2: 25 mmol/L (ref 22–32)
Calcium: 8.5 mg/dL — ABNORMAL LOW (ref 8.9–10.3)
Chloride: 101 mmol/L (ref 98–111)
Creatinine, Ser: 0.97 mg/dL (ref 0.44–1.00)
GFR calc Af Amer: >60 mL/min (ref >60)
GFR calc non Af Amer: 57 mL/min — ABNORMAL LOW (ref >60)
Glucose, Bld: 108 mg/dL — ABNORMAL HIGH (ref 70–99)
Potassium: 4 mmol/L (ref 3.5–5.1)
Sodium: 134 mmol/L — ABNORMAL LOW (ref 135–145)
Total Bilirubin: 0.2 mg/dL — ABNORMAL LOW (ref 0.3–1.2)
Total Protein: 5.6 g/dL — ABNORMAL LOW (ref 6.5–8.1)

## 2018-12-14 LAB — CBC WITH DIFFERENTIAL/PLATELET
Abs Immature Granulocytes: 0.01 10*3/uL (ref 0.00–0.07)
Basophils Absolute: 0 10*3/uL (ref 0.0–0.1)
Basophils Relative: 1 %
Eosinophils Absolute: 0.2 10*3/uL (ref 0.0–0.5)
Eosinophils Relative: 4 %
HCT: 29.4 % — ABNORMAL LOW (ref 36.0–46.0)
Hemoglobin: 9.3 g/dL — ABNORMAL LOW (ref 12.0–15.0)
Immature Granulocytes: 0 %
Lymphocytes Relative: 36 %
Lymphs Abs: 2 10*3/uL (ref 0.7–4.0)
MCH: 29.6 pg (ref 26.0–34.0)
MCHC: 31.6 g/dL (ref 30.0–36.0)
MCV: 93.6 fL (ref 80.0–100.0)
Monocytes Absolute: 0.7 10*3/uL (ref 0.1–1.0)
Monocytes Relative: 13 %
Neutro Abs: 2.6 10*3/uL (ref 1.7–7.7)
Neutrophils Relative %: 46 %
Platelets: 188 10*3/uL (ref 150–400)
RBC: 3.14 MIL/uL — ABNORMAL LOW (ref 3.87–5.11)
RDW: 13.9 % (ref 11.5–15.5)
WBC: 5.6 10*3/uL (ref 4.0–10.5)
nRBC: 0 % (ref 0.0–0.2)

## 2018-12-14 LAB — TSH: TSH: 0.855 u[IU]/mL (ref 0.350–4.500)

## 2018-12-14 NOTE — ED Provider Notes (Signed)
[facility] EMERGENCY DEPARTMENT Provider Note   CSN: [id number] Arrival date & time: 12/14/18  1826     History   Chief Complaint Chief Complaint  Patient presents with  . Bradycardia    HPI Cynthia [name] is a 78 y.o. female.     78 y/o female with a PMH of Anemia, A. fib, paroxysmal atrial fibrillation presents to the ED via EMS for bradycardia and hypotension. According to EMS report patient was not following commands she was somewhat altered, states she has taken her medications with supper but is unable to provide a full history. She was found with a pressure on the 0000000 systolic was given [id number] cc. Patient is a very poor historian falling a sleep mid sentence. Will place call for husband for collateral information.   The history is provided by the patient and medical records.    Past Medical History:  Diagnosis Date  . Acute blood loss anemia 10/27/2017  . Acute GI bleeding 10/27/2017  . Anemia   . Arthritis    "joints" (11/10/2017)  . Dizziness   . GERD (gastroesophageal reflux disease)   . GI bleed 11/06/2017  . Hearing loss   . History of blood transfusion 10/2017  . Hyperlipidemia   . Hyperthyroidism   . Melanoma ([place])    "cut off my back"  . Memory loss   . Sinus headache   . Sleep apnea     Patient Active Problem List   Diagnosis Date Noted  . Nocturia 07/01/2018  . Midline cystocele 06/08/2018  . Recurrent UTI 06/08/2018  . Vaginal vault prolapse 06/08/2018  . Lightheadedness 06/02/2018  . Near syncope   . Hypotension 03/17/2018  . Postural dizziness with presyncope 03/17/2018  . PAF (paroxysmal atrial fibrillation) ([place]) 03/17/2018  . Memory loss 11/25/2017  . Acute lower UTI 10/29/2017  . Abnormal stress test   . Chest pain 11/23/2016  . Hyperlipidemia 11/23/2016  . GERD (gastroesophageal reflux disease) 11/23/2016  . History of recurrent UTIs 11/23/2016  . Polyneuropathy 10/30/2016  . Bursitis, subacromial 10/30/2016  .  Numbness 10/30/2016  . Arthritis, senescent 01/03/2016  . Left knee pain 01/03/2016  . Pain in joint, ankle and foot 02/28/2015  . Dry mouth 08/07/2014  . Dry eyes 08/07/2014  . Other headache syndrome 06/25/2014  . Sinusitis, chronic 06/25/2014  . Neck pain 06/25/2014  . OSA (obstructive sleep apnea) 06/25/2014  . Essential hypertension, benign 06/25/2014    Past Surgical History:  Procedure Laterality Date  . ABDOMINAL HYSTERECTOMY     "partial"  . APPENDECTOMY    . BIOPSY  11/17/2017   Procedure: BIOPSY;  Surgeon: Clarene Essex, MD;  Location: [facility];  Service: Endoscopy;;  . COLONOSCOPY WITH PROPOFOL N/A 11/17/2017   Procedure: COLONOSCOPY WITH PROPOFOL;  Surgeon: Clarene Essex, MD;  Location: [facility];  Service: Endoscopy;  Laterality: N/A;  . CRANIECTOMY FOR EXCISION OF ACOUSTIC NEUROMA    . ESOPHAGOGASTRODUODENOSCOPY (EGD) WITH PROPOFOL N/A 10/28/2017   Procedure: ESOPHAGOGASTRODUODENOSCOPY (EGD) WITH PROPOFOL;  Surgeon: Clarene Essex, MD;  Location: [facility];  Service: Endoscopy;  Laterality: N/A;  . KNEE ARTHROSCOPY Left   . LEFT HEART CATH AND CORONARY ANGIOGRAPHY N/A 11/24/2016   Procedure: LEFT HEART CATH AND CORONARY ANGIOGRAPHY;  Surgeon: Jettie Booze, MD;  Location: [facility];  Service: Cardiovascular;  Laterality: N/A;  . MELANOMA EXCISION     "off my back"  . NASAL SINUS SURGERY    . TONSILLECTOMY  OB History   No obstetric history on file.      Home Medications    Prior to Admission medications   Medication Sig Start Date End Date Taking? Authorizing Provider  acetaminophen (TYLENOL) 500 MG tablet Take 500 mg by mouth every 6 (six) hours as needed for mild pain.     [provider]  Alpha-Lipoic Acid (LIPOIC ACID PO) Take 2 tablets by mouth 2 (two) times daily.     [provider]  atorvastatin (LIPITOR) 10 MG tablet TAKE 1 TABLET BY MOUTH EVERY DAY 11/10/18   Adrian Prows, MD  Calcium Carbonate-Vitamin D3 600-400  MG-UNIT TABS Take 1 tablet by mouth 2 (two) times a day.     [provider]  cephALEXin (KEFLEX) 500 MG capsule Take 1 capsule (500 mg total) by mouth 4 (four) times daily. 09/21/18   Domenic Moras, PA-C  cholecalciferol (VITAMIN D) 1000 UNITS tablet Take 1,000 Units by mouth daily.    [provider]  diltiazem (CARDIZEM CD) 120 MG 24 hr capsule TAKE 1 CAPSULE BY MOUTH DAILY *NEED OFFICE VISIT FOR REFILLS* Patient taking differently: Take 120 mg by mouth every other day. "Taking every otherday" 06/27/18   Adrian Prows, MD  doxycycline (VIBRAMYCIN) 100 MG capsule TAKE 1 CAPSULE BY MOUTH TWICE A DAY FOR 7 DAYS 11/02/18   [provider]  DULoxetine (CYMBALTA) 30 MG capsule TAKE 1 CAPSULE BY MOUTH TWICE A DAY 11/10/18   Sater, Nanine Means, MD  ELIQUIS 5 MG TABS tablet TAKE 1 TABLET BY MOUTH TWICE A DAY 11/01/18   Adrian Prows, MD  GLUCOSAMINE-CHONDROITIN PO Take 2 tablets by mouth daily with lunch. Glucosamine 1500mg  Chondroitin 1200mg     [provider]  Lutein-Zeaxanthin 25-5 MG CAPS Take 1 tablet by mouth daily.    [provider]  Melatonin 5 MG TABS Take 3-5 mg by mouth at bedtime.     [provider]  mirabegron ER (MYRBETRIQ) 50 MG TB24 tablet Take 50 mg by mouth daily.    [provider]  Multiple Vitamin (MULTIVITAMIN WITH MINERALS) TABS tablet Take 1 tablet by mouth daily.    [provider]  nitroGLYCERIN (NITROLINGUAL) 0.4 MG/SPRAY spray Place 1 spray under the tongue every 5 (five) minutes x 3 doses as needed for chest pain.    [provider]  Omega-3 1000 MG CAPS Take 1,000 mg by mouth daily.    [provider]  pantoprazole (PROTONIX) 40 MG tablet Take 40 mg by mouth daily before breakfast.  06/12/14   [provider]  simethicone (MYLICON) 80 MG chewable tablet Chew 80 mg by mouth every 6 (six) hours as needed for flatulence.    [provider]  sucralfate (CARAFATE) 1 g tablet Take 1 g by  mouth 4 (four) times daily -  with meals and at bedtime. "3 times daily"    [provider]  thyroid (ARMOUR) 60 MG tablet Take 60 mg by mouth daily before breakfast. 11/07/17   [provider]  tiZANidine (ZANAFLEX) 4 MG tablet TAKE 1 TABLET BY MOUTH THREE TIMES A DAY AS NEEDED Patient taking differently: Take 4 mg by mouth 3 (three) times daily as needed for muscle spasms.  08/29/18   Sater, Nanine Means, MD  triamterene-hydrochlorothiazide (MAXZIDE-25) 37.5-25 MG tablet TAKE 1 TABLET BY MOUTH EVERY DAY IN THE MORNING AS NEEDED FOR EDEMA 11/02/18   [provider]  vitamin C (ASCORBIC ACID) 500 MG tablet Take 1,000 mg by mouth daily.  [provider]    Family History Family History  Problem Relation Age of Onset  . Congestive Heart Failure Mother   . Heart disease Father        History of heart attacks at a later age.      Social History Social History   Tobacco Use  . Smoking status: Former Smoker    Years: 2.00    Types: Cigarettes    Quit date: 1980    Years since quitting: 40.6  . Smokeless tobacco: Never Used  . Tobacco comment: 11/10/2017 "only smoked when I was on my period"  Substance Use Topics  . Alcohol use: Yes    Comment: 11/10/2017 "nothing lately; did have 1 glass of wine q night"  . Drug use: Never     Allergies   Avelox [moxifloxacin hcl in nacl] and Alendronate sodium   Review of Systems Review of Systems  Constitutional: Positive for fatigue. Negative for chills and fever.  HENT: Negative for ear pain and sore throat.   Eyes: Negative for pain and visual disturbance.  Respiratory: Negative for cough and shortness of breath.   Cardiovascular: Negative for chest pain and palpitations.  Gastrointestinal: Negative for abdominal pain and vomiting.  Genitourinary: Negative for dysuria and hematuria.  Musculoskeletal: Negative for arthralgias and back pain.  Skin: Negative for color change and rash.  Neurological: Positive  for speech difficulty and weakness. Negative for seizures and syncope.  All other systems reviewed and are negative.    Physical Exam Updated Vital Signs BP 126/71   Pulse (!) 58   Temp 97.6 F (36.4 C)   Resp 14   Ht 5\' 5"  (1.651 m)   Wt 74.8 kg   SpO2 98%   BMI 27.46 kg/m   Physical Exam Vitals signs and nursing note reviewed.  Constitutional:      General: She is not in acute distress.    Appearance: She is well-developed.  HENT:     Head: Normocephalic and atraumatic.     Mouth/Throat:     Pharynx: No oropharyngeal exudate.  Eyes:     Pupils: Pupils are equal, round, and reactive to light.  Neck:     Musculoskeletal: Normal range of motion.  Cardiovascular:     Rate and Rhythm: Regular rhythm. Bradycardia present.     Heart sounds: Normal heart sounds.  Pulmonary:     Effort: Pulmonary effort is normal. No respiratory distress.     Breath sounds: Normal breath sounds.  Abdominal:     General: Bowel sounds are normal. There is no distension.     Palpations: Abdomen is soft.     Tenderness: There is no abdominal tenderness.  Musculoskeletal:        General: No tenderness or deformity.     Right lower leg: No edema.     Left lower leg: No edema.  Skin:    General: Skin is warm and dry.  Neurological:     Mental Status: She is alert. She is disoriented.     Cranial Nerves: No cranial nerve deficit.     Motor: No weakness.     Comments: Mumbling through all her evaluation, appears sleepy.        ED Treatments / Results  Labs (all labs ordered are listed, but only abnormal results are displayed) Labs Reviewed  CBC WITH DIFFERENTIAL/PLATELET - Abnormal; Notable for the following components:      Result Value   RBC 3.14 (*)    Hemoglobin 9.3 (*)  HCT 29.4 (*)    All other components within normal limits  COMPREHENSIVE METABOLIC PANEL - Abnormal; Notable for the following components:   Sodium 134 (*)    Glucose, Bld 108 (*)    Calcium 8.5 (*)     Total Protein 5.6 (*)    Albumin 3.3 (*)    Total Bilirubin 0.2 (*)    GFR calc non Af Amer 57 (*)    All other components within normal limits  TSH  URINALYSIS, ROUTINE W REFLEX MICROSCOPIC  POC OCCULT BLOOD, ED    EKG EKG Interpretation  Date/Time:  Wednesday December 14 2018 18:39:56 EDT Ventricular Rate:  56 PR Interval:    QRS Duration: 112 QT Interval:  556 QTC Calculation: 537 R Axis:   -2 Text Interpretation:  Sinus rhythm Borderline intraventricular conduction delay Prolonged QT interval Nonspecific T wave abnormality Confirmed by Lajean Saver [phone]) on 12/14/2018 6:43:13 PM   Radiology Ct Head Wo Contrast  Result Date: 12/14/2018 CLINICAL DATA:  Altered LOC EXAM: CT HEAD WITHOUT CONTRAST TECHNIQUE: Contiguous axial images were obtained from the base of the skull through the vertex without intravenous contrast. COMPARISON:  CT 11/06/2017 FINDINGS: Brain: No acute territorial infarction, hemorrhage, or intracranial mass. Atrophy and mild small vessel ischemic changes of the white matter. Stable asymmetric enlargement of left greater than right ventricles. Vascular: No hyperdense vessels. Scattered calcifications at the carotid siphon Skull: Left partial mastoidectomy and suboccipital craniectomy. No fracture Sinuses/Orbits: Chronic bony hypertrophy of the left maxillary and right sphenoid sinus walls with mucosal opacification. Other: None IMPRESSION: 1. No CT evidence for acute intracranial abnormality. 2. Atrophy and mild small vessel ischemic changes of the white matter Electronically Signed   By: Donavan Foil M.D.   On: 12/14/2018 21:07    Procedures Procedures (including critical care time)  Medications Ordered in ED Medications - No data to display   Initial Impression / Assessment and Plan / ED Course  I have reviewed the triage vital signs and the nursing notes.  Pertinent labs & imaging results that were available during my care of the patient were reviewed by  me and considered in my medical decision making (see chart for details).       Patient with extensive past medical history presents to the ED via EMS for eval of symptomatic bradycardia.  EMS was called to the home as patient had "thick tongue" with slurred speech onset at noon this afternoon.  Neuro exam was intact during my evaluation, she did seem somewhat drowsy, will answer questions and not make much sense, falling asleep during our evaluation.  Apparently EMS arrived to the scene and patient was found to be hypotensive with a systolic of [id number], she was placed on Trendelenburg along with 500 cc bolus which help with her blood pressure.  Patient is a very poor historian, reports she has been treated for UTI, overactive bladder that has been going on for several years.  Recently saw Dr. Einar Gip who on August 26, was found to have an EKG without any changes.  Her last evaluations included carotid duplex which was done last December 2019.  They both had normal flow on the subclavian's.  Last echo was obtained on November 09, 2017, EF between 60 to 65%.  Last coronary angiogram was performed in 2018, found to have normal coronaries but tortuous LAD.  Patient's heart rate on that visit was 75, on today's visit patient has been in the 50s, 60s range.  Does report somewhat fatigue.  Will obtain collateral information from husband.  Husband at the bedside reports patient seems somewhat confused this afternoon upon speaking to her, reports "I felt like she was not getting enough blood to her brain ", her heart rate was very low, her pressure was very low.  CMP today remarkable for slight hyponatremia, LFTs within normal limits.  Kidney function is preserved.  CBC without any leukocytosis, hemoglobin does appear slightly decreased at 9.3, she denies any bleeding rectally, vaginally.  Will obtain Hemoccult as this has been trending down since her previous visits at her PCPs and cardiology.  TSH within normal limits.   UA is currently pending, patient is placed on a pure wick.  CT head was obtained to further evaluate patient's altered mental status episode.  CT head showed: 1. No CT evidence for acute intracranial abnormality.  2. Atrophy and mild small vessel ischemic changes of the white  matter       11:41 PM spoke to patient and husband who reported this episode was out of the ordinary for patient, she also endorses a mild frontal headache which she has had for the last couple days, states that she thought this was likely due to her being more fatigued recently but has not taken any medication for relieving symptoms of her headache.  No photophobia, no nausea, vomiting.  Patient pending MRI Brain for further evaluation r/o hemorrhage or infarct, if normal patient likely dispo home. Signed out to incoming provider.   Portions of this note were generated with [employer]. Dictation errors may occur despite best attempts at proofreading.   Final Clinical Impressions(s) / ED Diagnoses   Final diagnoses:  Symptomatic bradycardia  Altered mental status, unspecified altered mental status type    ED Discharge Orders    None       Janeece Fitting, PA-C 12/14/18 [id number]    Lajean Saver, MD 12/15/18 1430

## 2018-12-14 NOTE — ED Triage Notes (Signed)
Pt BIB [facility] for eval of symptomatic bradycardia. EMS was called for pt experiencing "thick tongue" w/ slurred speech onset 1200 this afternoon, LVO negative. Pt reports she laid down and napped and didn't feel any better about her speech, so she called 911. Pt is poor historian, EMS reports there was some confusion on scene as to whether she might have taken her medications twice (including diltiazem), or she may have taken her husbands medications. On EMS arrival, pt was lethargic and pale, found to have HR in the 40-50s w/ initial BP at 60/30. Pt improved on BP w/ trendelenburg and 500cc fluid bolus. Pt arrives lethargic, falling asleep mid sentence, which she reports is abnormal. Speech is slightly thickened, no obvious neuro deficits on exam, pt remains sinus brady in the 50s. Normally in Afib rate controlled in the 90s.

## 2018-12-15 DIAGNOSIS — R4182 Altered mental status, unspecified: Secondary | ICD-10-CM | POA: Diagnosis not present

## 2018-12-15 LAB — URINALYSIS, ROUTINE W REFLEX MICROSCOPIC
Bilirubin Urine: NEGATIVE
Glucose, UA: NEGATIVE mg/dL
Hgb urine dipstick: NEGATIVE
Ketones, ur: NEGATIVE mg/dL
Leukocytes,Ua: NEGATIVE
Nitrite: NEGATIVE
Protein, ur: NEGATIVE mg/dL
Specific Gravity, Urine: 1.004 — ABNORMAL LOW (ref 1.005–1.030)
pH: 8 (ref 5.0–8.0)

## 2018-12-15 LAB — POC OCCULT BLOOD, ED: Fecal Occult Bld: NEGATIVE

## 2018-12-15 MED ORDER — GADOBUTROL 1 MMOL/ML IV SOLN
7.5000 mL | Freq: Once | INTRAVENOUS | Status: AC | PRN
  Administered 2018-12-15: 7.5 mL via INTRAVENOUS

## 2018-12-15 NOTE — ED Notes (Signed)
Patient transported to MRI 

## 2018-12-15 NOTE — ED Provider Notes (Signed)
Care assumed from previous provider PA Soto. Please see note for further details. Case discussed, plan agreed upon. Will follow up on pending MRI. If negative, anticipate discharge to home.   MRI without acute findings.  UA also negative.  Reviewed all results and orders. Hemoccult was ordered but not completed. I performed exam. No active bleeding noted and hemoccult negative.  Repeat vitals reassuring. HR 69.  Patient evaluated. She feels improved and comfortable with discharge to home. Husband agrees. They have PCP and cardiologist whom they believe they can follow up with this week.  Reasons to return to ER were discussed and all questions were answered.    Cynthia [name], Cynthia Almond, PA-C 12/15/18 0354    Merrily Pew, MD 12/15/18 [phone]

## 2018-12-15 NOTE — Discharge Instructions (Signed)
It was my pleasure taking care of you today!   Call your doctor tomorrow to schedule a follow up appointment.   Return to ER for new or worsening symptoms, any additional concerns.

## 2018-12-19 DIAGNOSIS — G4733 Obstructive sleep apnea (adult) (pediatric): Secondary | ICD-10-CM | POA: Diagnosis not present

## 2018-12-21 DIAGNOSIS — R358 Other polyuria: Secondary | ICD-10-CM | POA: Diagnosis not present

## 2018-12-21 DIAGNOSIS — N390 Urinary tract infection, site not specified: Secondary | ICD-10-CM | POA: Diagnosis not present

## 2018-12-26 ENCOUNTER — Ambulatory Visit: Payer: Medicare PPO | Admitting: Cardiology

## 2018-12-26 DIAGNOSIS — R358 Other polyuria: Secondary | ICD-10-CM | POA: Diagnosis not present

## 2018-12-26 DIAGNOSIS — M545 Low back pain: Secondary | ICD-10-CM | POA: Diagnosis not present

## 2018-12-26 DIAGNOSIS — N3001 Acute cystitis with hematuria: Secondary | ICD-10-CM | POA: Diagnosis not present

## 2018-12-26 DIAGNOSIS — R399 Unspecified symptoms and signs involving the genitourinary system: Secondary | ICD-10-CM | POA: Diagnosis not present

## 2019-01-01 ENCOUNTER — Other Ambulatory Visit: Payer: Self-pay | Admitting: Cardiology

## 2019-01-10 ENCOUNTER — Ambulatory Visit (INDEPENDENT_AMBULATORY_CARE_PROVIDER_SITE_OTHER): Payer: Medicare PPO | Admitting: Podiatry

## 2019-01-10 ENCOUNTER — Other Ambulatory Visit: Payer: Self-pay

## 2019-01-10 VITALS — Temp 98.0°F

## 2019-01-10 DIAGNOSIS — M2141 Flat foot [pes planus] (acquired), right foot: Secondary | ICD-10-CM

## 2019-01-10 DIAGNOSIS — M2142 Flat foot [pes planus] (acquired), left foot: Secondary | ICD-10-CM

## 2019-01-10 DIAGNOSIS — L84 Corns and callosities: Secondary | ICD-10-CM

## 2019-01-10 DIAGNOSIS — M898X9 Other specified disorders of bone, unspecified site: Secondary | ICD-10-CM

## 2019-01-11 NOTE — Progress Notes (Signed)
Subjective: 78 year old female presents the office today for concerns of recurrent painful calluses to both of her feet with the left side worse than the right.  She states that she was doing well at about 2 weeks ago.  She denies any swelling or redness or any drainage.  She has been using several creams over to help with the pain. Denies any systemic complaints such as fevers, chills, nausea, vomiting. No acute changes since last appointment, and no other complaints at this time.   Objective: AAO x3, NAD DP/PT pulses palpable bilaterally, CRT less than 3 seconds Crepitus present on the plantar medial aspect of foot is a hyperkeratotic lesion bilaterally left side worse than the right.  Upon debridement the area is pre-ulcerative on the left side there is no definitive skin breakdown identified at this time.  No surrounding erythema, ascending colitis.  No fluctuation potation. No pain with calf compression, swelling, warmth, erythema  Assessment: 78 year old female with symptomatic hyperkeratotic lesions  Plan: -All treatment options discussed with the patient including all alternatives, risks, complications.  -Lesions were sharply debrided x2 without any complications or bleeding.  Recommended offloading at all times and she can continue with urea cream. -Rick modified inserts today -Patient encouraged to call the office with any questions, concerns, change in symptoms.   Trula Slade DPM

## 2019-01-18 DIAGNOSIS — G4733 Obstructive sleep apnea (adult) (pediatric): Secondary | ICD-10-CM | POA: Diagnosis not present

## 2019-01-23 DIAGNOSIS — K219 Gastro-esophageal reflux disease without esophagitis: Secondary | ICD-10-CM | POA: Diagnosis not present

## 2019-01-23 DIAGNOSIS — H9193 Unspecified hearing loss, bilateral: Secondary | ICD-10-CM | POA: Diagnosis not present

## 2019-01-23 DIAGNOSIS — R2681 Unsteadiness on feet: Secondary | ICD-10-CM | POA: Diagnosis not present

## 2019-01-23 DIAGNOSIS — G4700 Insomnia, unspecified: Secondary | ICD-10-CM | POA: Diagnosis not present

## 2019-01-23 DIAGNOSIS — H547 Unspecified visual loss: Secondary | ICD-10-CM | POA: Diagnosis not present

## 2019-01-23 DIAGNOSIS — N393 Stress incontinence (female) (male): Secondary | ICD-10-CM | POA: Diagnosis not present

## 2019-01-23 DIAGNOSIS — E039 Hypothyroidism, unspecified: Secondary | ICD-10-CM | POA: Diagnosis not present

## 2019-01-23 DIAGNOSIS — I110 Hypertensive heart disease with heart failure: Secondary | ICD-10-CM | POA: Diagnosis not present

## 2019-01-23 DIAGNOSIS — M47819 Spondylosis without myelopathy or radiculopathy, site unspecified: Secondary | ICD-10-CM | POA: Diagnosis not present

## 2019-01-23 DIAGNOSIS — Z974 Presence of external hearing-aid: Secondary | ICD-10-CM | POA: Diagnosis not present

## 2019-01-23 DIAGNOSIS — Z7901 Long term (current) use of anticoagulants: Secondary | ICD-10-CM | POA: Diagnosis not present

## 2019-01-23 DIAGNOSIS — M545 Low back pain: Secondary | ICD-10-CM | POA: Diagnosis not present

## 2019-01-23 DIAGNOSIS — H04129 Dry eye syndrome of unspecified lacrimal gland: Secondary | ICD-10-CM | POA: Diagnosis not present

## 2019-01-23 DIAGNOSIS — G4733 Obstructive sleep apnea (adult) (pediatric): Secondary | ICD-10-CM | POA: Diagnosis not present

## 2019-01-23 DIAGNOSIS — F325 Major depressive disorder, single episode, in full remission: Secondary | ICD-10-CM | POA: Diagnosis not present

## 2019-01-23 DIAGNOSIS — M170 Bilateral primary osteoarthritis of knee: Secondary | ICD-10-CM | POA: Diagnosis not present

## 2019-01-23 DIAGNOSIS — Z6826 Body mass index (BMI) 26.0-26.9, adult: Secondary | ICD-10-CM | POA: Diagnosis not present

## 2019-01-23 DIAGNOSIS — M62838 Other muscle spasm: Secondary | ICD-10-CM | POA: Diagnosis not present

## 2019-01-23 DIAGNOSIS — E559 Vitamin D deficiency, unspecified: Secondary | ICD-10-CM | POA: Diagnosis not present

## 2019-01-31 DIAGNOSIS — R2681 Unsteadiness on feet: Secondary | ICD-10-CM | POA: Diagnosis not present

## 2019-02-01 ENCOUNTER — Other Ambulatory Visit: Payer: Self-pay | Admitting: Neurology

## 2019-02-09 ENCOUNTER — Ambulatory Visit: Payer: Medicare PPO | Admitting: Cardiology

## 2019-02-09 ENCOUNTER — Other Ambulatory Visit: Payer: Self-pay | Admitting: *Deleted

## 2019-02-09 MED ORDER — TIZANIDINE HCL 4 MG PO TABS: 4.0000 mg | ORAL_TABLET | Freq: Three times a day (TID) | ORAL | 5 refills | Status: DC | PRN

## 2019-02-13 DIAGNOSIS — R300 Dysuria: Secondary | ICD-10-CM | POA: Diagnosis not present

## 2019-02-13 DIAGNOSIS — N390 Urinary tract infection, site not specified: Secondary | ICD-10-CM | POA: Diagnosis not present

## 2019-02-13 DIAGNOSIS — N3281 Overactive bladder: Secondary | ICD-10-CM | POA: Diagnosis not present

## 2019-02-18 DIAGNOSIS — G4733 Obstructive sleep apnea (adult) (pediatric): Secondary | ICD-10-CM | POA: Diagnosis not present

## 2019-02-22 DIAGNOSIS — E039 Hypothyroidism, unspecified: Secondary | ICD-10-CM | POA: Diagnosis not present

## 2019-02-22 DIAGNOSIS — I10 Essential (primary) hypertension: Secondary | ICD-10-CM | POA: Diagnosis not present

## 2019-02-22 DIAGNOSIS — E7800 Pure hypercholesterolemia, unspecified: Secondary | ICD-10-CM | POA: Diagnosis not present

## 2019-02-24 ENCOUNTER — Telehealth: Payer: Self-pay | Admitting: *Deleted

## 2019-02-24 NOTE — Telephone Encounter (Signed)
I would recommend tylenol for pain, apply a small amount of antibiotic ointment to the area. She can also use the neosporin with pain relief. Can you see if there is any drainage or red streaks or is it just red right about the area? Would like to see if she should start an antibiotic.

## 2019-02-24 NOTE — Telephone Encounter (Signed)
I called pt and she states the callous on the left foot is very pain and very red and crusty, difficulty in walking. I told pt I would send a message to Dr. Jacqualyn Posey for the pain medication and offered her an appt tomorrow at 10:45am. Pt states understanding and will be at the appt.

## 2019-02-24 NOTE — Telephone Encounter (Signed)
Pt states she is having pain in the callous in her arch and requested pain medication.

## 2019-02-25 ENCOUNTER — Ambulatory Visit: Payer: Medicare PPO | Admitting: Podiatry

## 2019-02-25 ENCOUNTER — Telehealth: Payer: Self-pay | Admitting: *Deleted

## 2019-02-25 NOTE — Telephone Encounter (Signed)
Tried to call the patient and the patient's voice mail is full. Cynthia [name]

## 2019-02-27 ENCOUNTER — Other Ambulatory Visit: Payer: Self-pay

## 2019-02-27 ENCOUNTER — Ambulatory Visit (INDEPENDENT_AMBULATORY_CARE_PROVIDER_SITE_OTHER): Payer: Medicare PPO | Admitting: Podiatry

## 2019-02-27 DIAGNOSIS — B351 Tinea unguium: Secondary | ICD-10-CM

## 2019-02-27 DIAGNOSIS — M79674 Pain in right toe(s): Secondary | ICD-10-CM

## 2019-02-27 DIAGNOSIS — L84 Corns and callosities: Secondary | ICD-10-CM

## 2019-02-27 DIAGNOSIS — Z7901 Long term (current) use of anticoagulants: Secondary | ICD-10-CM

## 2019-02-27 DIAGNOSIS — M79675 Pain in left toe(s): Secondary | ICD-10-CM | POA: Diagnosis not present

## 2019-02-27 NOTE — Telephone Encounter (Signed)
I called pt and spoke with Cynthia [name] and informed of the 1:15pm appt with Dr. Jacqualyn Posey today, and asked if there was problem causing the absence from the appt on 02/25/2019. Cynthia [name] states he was not aware of the appt, they were going out of town. [name] then apologized he found the appt and at that time they were so busy preparing for their trip they forgot.

## 2019-02-27 NOTE — Telephone Encounter (Signed)
Its no problem. I was worried about her Saturday and Cynthia [name] called but we never got in touch with them.

## 2019-02-27 NOTE — Telephone Encounter (Signed)
I called pt and she states the foot is very painful, and asked her husband take a look. Pt's husband, Jearld Fenton state the callous has redness around it and a dark center and may have fluid beneath, he states pt never complains of pain. I asked Jearld Fenton if I could discuss with scheduler and Dr. Jacqualyn Posey and call again with an appt. Dr. Jacqualyn Posey states pt did not show for appt on Saturday and L. Cox, CMA called without response. Dr. Jacqualyn Posey offered an appt today.

## 2019-03-01 DIAGNOSIS — N390 Urinary tract infection, site not specified: Secondary | ICD-10-CM | POA: Diagnosis not present

## 2019-03-01 DIAGNOSIS — R300 Dysuria: Secondary | ICD-10-CM | POA: Diagnosis not present

## 2019-03-05 NOTE — Progress Notes (Signed)
Subjective: 78 year old female presents the office today for concerns of recurrent painful calluses to both of her feet with the left side worse than the right.  Has noticed some redness around the callus but denies any drainage or pus.  She states to become tender to stand and put pressure on her feet.  Has been painful over the last 1 week.  She also wants to discuss shoe options.  She is also asking for her nails be trimmed today's are thickened elongated she cannot trim them herself. Denies any systemic complaints such as fevers, chills, nausea, vomiting. No acute changes since last appointment, and no other complaints at this time.   She is on Eliquis   Objective: AAO x3, NAD DP/PT pulses palpable bilaterally, CRT less than 3 seconds Tenderness present on the plantar medial aspect of foot is a hyperkeratotic lesion bilaterally left side worse than the right.  Upon debridement the area is pre-ulcerative on the left side there is no definitive skin breakdown identified.  No surrounding erythema, ascending cellulitis.  No fluctuation or crepitation.  There is no clinical signs of infection. Nails are hypertrophic, dystrophic, brittle, discolored, elongated 10. No surrounding redness or drainage. Tenderness nails 1-5 bilaterally. No open lesions or pre-ulcerative lesions are identified today. Significant flatfoot is evident. No pain with calf compression, swelling, warmth, erythema  Assessment: 78 year old female with symptomatic hyperkeratotic lesions; symptomatic onychomycosis  Plan: -All treatment options discussed with the patient including all alternatives, risks, complications.  -Lesions were sharply debrided x2 without any complications or bleeding.  Recommended offloading at all times and she can continue with moisturizer.  I had Liliane Channel evaluate her today for possible shoes and inserts modifications. -Nails debrided x10 without any complications or bleeding -Patient encouraged to call the  office with any questions, concerns, change in symptoms.   Trula Slade DPM

## 2019-03-10 DIAGNOSIS — Z20828 Contact with and (suspected) exposure to other viral communicable diseases: Secondary | ICD-10-CM | POA: Diagnosis not present

## 2019-03-13 DIAGNOSIS — R2681 Unsteadiness on feet: Secondary | ICD-10-CM | POA: Diagnosis not present

## 2019-03-13 DIAGNOSIS — Z7901 Long term (current) use of anticoagulants: Secondary | ICD-10-CM | POA: Diagnosis not present

## 2019-03-13 DIAGNOSIS — M8589 Other specified disorders of bone density and structure, multiple sites: Secondary | ICD-10-CM | POA: Diagnosis not present

## 2019-03-13 DIAGNOSIS — M85851 Other specified disorders of bone density and structure, right thigh: Secondary | ICD-10-CM | POA: Diagnosis not present

## 2019-03-13 DIAGNOSIS — Z0000 Encounter for general adult medical examination without abnormal findings: Secondary | ICD-10-CM | POA: Diagnosis not present

## 2019-03-13 DIAGNOSIS — N390 Urinary tract infection, site not specified: Secondary | ICD-10-CM | POA: Diagnosis not present

## 2019-03-13 DIAGNOSIS — E039 Hypothyroidism, unspecified: Secondary | ICD-10-CM | POA: Diagnosis not present

## 2019-03-13 DIAGNOSIS — N3281 Overactive bladder: Secondary | ICD-10-CM | POA: Diagnosis not present

## 2019-03-13 DIAGNOSIS — E7800 Pure hypercholesterolemia, unspecified: Secondary | ICD-10-CM | POA: Diagnosis not present

## 2019-03-13 DIAGNOSIS — I480 Paroxysmal atrial fibrillation: Secondary | ICD-10-CM | POA: Diagnosis not present

## 2019-03-15 ENCOUNTER — Encounter: Payer: Self-pay | Admitting: Cardiology

## 2019-03-20 DIAGNOSIS — G4733 Obstructive sleep apnea (adult) (pediatric): Secondary | ICD-10-CM | POA: Diagnosis not present

## 2019-03-22 DIAGNOSIS — M810 Age-related osteoporosis without current pathological fracture: Secondary | ICD-10-CM | POA: Diagnosis not present

## 2019-04-05 ENCOUNTER — Ambulatory Visit: Payer: Self-pay | Admitting: *Deleted

## 2019-04-05 NOTE — Telephone Encounter (Signed)
Pt reports cough, achy, cold symptoms. PCP prescribed OTC meds she is taking. States calling "To make sure she is doing everything right."  Advised to self isolate from others in home. Also reports one episode of vomiting after breakfast. States afebrile. Advised to follow PCPs advise whom she spoke with earlier, hydrate and rest. Advised to call PCP in am if needed. Pt verbalizes understanding.

## 2019-04-10 ENCOUNTER — Telehealth: Payer: Self-pay

## 2019-04-10 NOTE — Telephone Encounter (Signed)
LVM with details patient will most likely call back

## 2019-04-10 NOTE — Telephone Encounter (Signed)
Reason for call: Pt husband called and says this morning pt went to bathroom 7 am and she felt "clammy" and when she got up to go back to bed she almost didn't make it like she was about to faint almost; He helped her back on the bed and gave her some water and she felt better; Pt husband says the "clammy" is new symptom and was wondering if it had anything to do with the afib? Please advise

## 2019-04-10 NOTE — Telephone Encounter (Signed)
No it was due to low BP when she stood up probably

## 2019-04-11 ENCOUNTER — Ambulatory Visit: Payer: Medicare PPO | Admitting: Podiatry

## 2019-04-11 DIAGNOSIS — M810 Age-related osteoporosis without current pathological fracture: Secondary | ICD-10-CM | POA: Diagnosis not present

## 2019-04-18 DIAGNOSIS — R399 Unspecified symptoms and signs involving the genitourinary system: Secondary | ICD-10-CM | POA: Diagnosis not present

## 2019-04-19 DIAGNOSIS — H40023 Open angle with borderline findings, high risk, bilateral: Secondary | ICD-10-CM | POA: Diagnosis not present

## 2019-04-19 DIAGNOSIS — H2513 Age-related nuclear cataract, bilateral: Secondary | ICD-10-CM | POA: Diagnosis not present

## 2019-04-20 ENCOUNTER — Other Ambulatory Visit: Payer: Self-pay

## 2019-04-20 ENCOUNTER — Ambulatory Visit: Payer: Medicare PPO | Admitting: Cardiology

## 2019-04-20 DIAGNOSIS — G4733 Obstructive sleep apnea (adult) (pediatric): Secondary | ICD-10-CM | POA: Diagnosis not present

## 2019-04-21 ENCOUNTER — Ambulatory Visit (INDEPENDENT_AMBULATORY_CARE_PROVIDER_SITE_OTHER): Payer: Medicare PPO | Admitting: Podiatry

## 2019-04-21 ENCOUNTER — Other Ambulatory Visit: Payer: Self-pay

## 2019-04-21 DIAGNOSIS — L84 Corns and callosities: Secondary | ICD-10-CM

## 2019-04-21 DIAGNOSIS — M79675 Pain in left toe(s): Secondary | ICD-10-CM | POA: Diagnosis not present

## 2019-04-21 DIAGNOSIS — B351 Tinea unguium: Secondary | ICD-10-CM

## 2019-04-21 DIAGNOSIS — Z7901 Long term (current) use of anticoagulants: Secondary | ICD-10-CM | POA: Diagnosis not present

## 2019-04-21 DIAGNOSIS — M79674 Pain in right toe(s): Secondary | ICD-10-CM | POA: Diagnosis not present

## 2019-04-21 NOTE — Progress Notes (Signed)
Subjective: 79 year old female presents the office today for concerns of recurrent painful calluses to both of her feet with the left side worse than the right as well as for thick, discolored toenails that she cannot trim her self.  Denies any redness or drainage from the toenail sites with the calluses.  She has no other concerns today.  She is on Eliquis   Objective: AAO x3, NAD DP/PT pulses palpable bilaterally, CRT less than 3 seconds Tenderness present on the plantar medial aspect of foot is a hyperkeratotic lesion bilaterally left side worse than the right.  Upon debridement the area is pre-ulcerative on the left side there is no definitive skin breakdown identified.  No surrounding erythema, ascending cellulitis.  No fluctuation or crepitation.  There is no clinical signs of infection. Nails are hypertrophic, dystrophic, brittle, discolored, elongated 10. No surrounding redness or drainage. Tenderness nails 1-5 bilaterally. No open lesions or pre-ulcerative lesions are identified today. Significant flatfoot is evident. No pain with calf compression, swelling, warmth, erythema  Assessment: 79 year old female with symptomatic hyperkeratotic lesions; symptomatic onychomycosis  Plan: -All treatment options discussed with the patient including all alternatives, risks, complications.  -Lesions were sharply debrided x2 without any complications or bleeding.  Continue offloading at all times and continue with moisturizer to the area daily.  I -Nails debrided x10 without any complications or bleeding -Patient encouraged to call the office with any questions, concerns, change in symptoms.   Trula Slade DPM

## 2019-04-22 ENCOUNTER — Other Ambulatory Visit: Payer: Self-pay | Admitting: Cardiology

## 2019-04-22 DIAGNOSIS — E782 Mixed hyperlipidemia: Secondary | ICD-10-CM

## 2019-04-26 DIAGNOSIS — R339 Retention of urine, unspecified: Secondary | ICD-10-CM | POA: Diagnosis not present

## 2019-04-26 DIAGNOSIS — N3946 Mixed incontinence: Secondary | ICD-10-CM | POA: Diagnosis not present

## 2019-04-26 DIAGNOSIS — R351 Nocturia: Secondary | ICD-10-CM | POA: Diagnosis not present

## 2019-04-26 DIAGNOSIS — N8111 Cystocele, midline: Secondary | ICD-10-CM | POA: Diagnosis not present

## 2019-04-26 DIAGNOSIS — R399 Unspecified symptoms and signs involving the genitourinary system: Secondary | ICD-10-CM | POA: Diagnosis not present

## 2019-05-07 ENCOUNTER — Other Ambulatory Visit: Payer: Self-pay | Admitting: Cardiology

## 2019-05-07 DIAGNOSIS — I480 Paroxysmal atrial fibrillation: Secondary | ICD-10-CM

## 2019-05-08 DIAGNOSIS — R399 Unspecified symptoms and signs involving the genitourinary system: Secondary | ICD-10-CM | POA: Diagnosis not present

## 2019-05-16 DIAGNOSIS — R351 Nocturia: Secondary | ICD-10-CM | POA: Diagnosis not present

## 2019-05-16 DIAGNOSIS — N3946 Mixed incontinence: Secondary | ICD-10-CM | POA: Diagnosis not present

## 2019-05-16 DIAGNOSIS — N390 Urinary tract infection, site not specified: Secondary | ICD-10-CM | POA: Diagnosis not present

## 2019-05-16 DIAGNOSIS — N8111 Cystocele, midline: Secondary | ICD-10-CM | POA: Diagnosis not present

## 2019-05-21 DIAGNOSIS — G4733 Obstructive sleep apnea (adult) (pediatric): Secondary | ICD-10-CM | POA: Diagnosis not present

## 2019-05-22 ENCOUNTER — Ambulatory Visit: Payer: Medicare PPO | Admitting: Cardiology

## 2019-05-24 DIAGNOSIS — R350 Frequency of micturition: Secondary | ICD-10-CM | POA: Insufficient documentation

## 2019-05-24 DIAGNOSIS — N3281 Overactive bladder: Secondary | ICD-10-CM | POA: Diagnosis not present

## 2019-05-24 DIAGNOSIS — N819 Female genital prolapse, unspecified: Secondary | ICD-10-CM | POA: Diagnosis not present

## 2019-05-24 DIAGNOSIS — R339 Retention of urine, unspecified: Secondary | ICD-10-CM | POA: Diagnosis not present

## 2019-05-24 DIAGNOSIS — N8111 Cystocele, midline: Secondary | ICD-10-CM | POA: Diagnosis not present

## 2019-05-24 DIAGNOSIS — N390 Urinary tract infection, site not specified: Secondary | ICD-10-CM | POA: Diagnosis not present

## 2019-06-01 ENCOUNTER — Ambulatory Visit: Payer: Medicare PPO | Admitting: Family Medicine

## 2019-06-05 ENCOUNTER — Encounter: Payer: Self-pay | Admitting: Cardiology

## 2019-06-12 ENCOUNTER — Encounter: Payer: Self-pay | Admitting: Cardiology

## 2019-06-12 ENCOUNTER — Ambulatory Visit: Payer: Medicare PPO | Admitting: Cardiology

## 2019-06-12 ENCOUNTER — Other Ambulatory Visit: Payer: Self-pay

## 2019-06-12 VITALS — Temp 96.5°F | Resp 16 | Ht 65.0 in | Wt 159.8 lb

## 2019-06-12 DIAGNOSIS — I951 Orthostatic hypotension: Secondary | ICD-10-CM

## 2019-06-12 DIAGNOSIS — I10 Essential (primary) hypertension: Secondary | ICD-10-CM

## 2019-06-12 DIAGNOSIS — D649 Anemia, unspecified: Secondary | ICD-10-CM

## 2019-06-12 DIAGNOSIS — I480 Paroxysmal atrial fibrillation: Secondary | ICD-10-CM | POA: Diagnosis not present

## 2019-06-12 NOTE — Progress Notes (Signed)
Primary Physician/Referring:  Deland Pretty, MD  [name], female    DOB: 07-Oct-1940, 79 y.o.   MRN: [id number]  Chief Complaint  Patient presents with  . Hypertension  . Atrial Fibrillation  . Follow-up    6 month follow up   HPI:    Cynthia [name]  is a 79 y.o. Caucasian female with hypertension, hyperlipidemia, frequent UTI,  paroxysmal atrial fibrillation, atrial tachycardia, obstructive sleep apnea on CPAP. She has surgery planned 06/26/19, colporrhaphy at [facility] anticoagulation well. Flecainide discontinued due to dizziness. No recurrence of AF.   She was not orthostatic on her last office visit and also today and suspect she has UTI with every severe orthostasis she has had in past with dizziness. C/O foot pain bilateral left > right.   Past Medical History:  Diagnosis Date  . Acute blood loss anemia 10/27/2017  . Acute GI bleeding 10/27/2017  . Anemia   . Arthritis    "joints" (11/10/2017)  . Dizziness   . GERD (gastroesophageal reflux disease)   . GI bleed 11/06/2017  . Hearing loss   . History of blood transfusion 10/2017  . Hyperlipidemia   . Hyperthyroidism   . Melanoma ([place])    "cut off my back"  . Memory loss   . Sinus headache   . Sleep apnea    Past Surgical History:  Procedure Laterality Date  . ABDOMINAL HYSTERECTOMY     "partial"  . APPENDECTOMY    . BIOPSY  11/17/2017   Procedure: BIOPSY;  Surgeon: Clarene Essex, MD;  Location: [facility];  Service: Endoscopy;;  . COLONOSCOPY WITH PROPOFOL N/A 11/17/2017   Procedure: COLONOSCOPY WITH PROPOFOL;  Surgeon: Clarene Essex, MD;  Location: [facility];  Service: Endoscopy;  Laterality: N/A;  . CRANIECTOMY FOR EXCISION OF ACOUSTIC NEUROMA    . ESOPHAGOGASTRODUODENOSCOPY (EGD) WITH PROPOFOL N/A 10/28/2017   Procedure: ESOPHAGOGASTRODUODENOSCOPY (EGD) WITH PROPOFOL;  Surgeon: Clarene Essex, MD;  Location: [facility];  Service: Endoscopy;  Laterality: N/A;  . KNEE ARTHROSCOPY Left   .  LEFT HEART CATH AND CORONARY ANGIOGRAPHY N/A 11/24/2016   Procedure: LEFT HEART CATH AND CORONARY ANGIOGRAPHY;  Surgeon: Jettie Booze, MD;  Location: [facility];  Service: Cardiovascular;  Laterality: N/A;  . MELANOMA EXCISION     "off my back"  . NASAL SINUS SURGERY    . TONSILLECTOMY     Family History  Problem Relation Age of Onset  . Congestive Heart Failure Mother   . Heart disease Father        History of heart attacks at a later age.    Marland Kitchen Heart disease Brother   . Hearing loss Brother     Social History   Tobacco Use  . Smoking status: Former Smoker    Years: 2.00    Types: Cigarettes    Quit date: 1980    Years since quitting: 41.1  . Smokeless tobacco: Never Used  . Tobacco comment: 11/10/2017 "only smoked when I was on my period"  Substance Use Topics  . Alcohol use: Yes    Comment: 11/10/2017 "nothing lately; did have 1 glass of wine q night"   ROS  Review of Systems  Cardiovascular: Negative for chest pain, dyspnea on exertion and leg swelling.  Gastrointestinal: Negative for melena.  Neurological: Positive for dizziness and light-headedness.   Objective  Temperature (!) 96.5 F (35.8 C), temperature source Temporal, resp. rate 16, height _0  (1.651 m), weight 159 lb 12.8  oz (72.5 kg), SpO2 93 %.  Vitals with BMI 06/12/2019 12/15/2018 12/15/2018  Height _0  - -  Weight 159 lbs 13 oz - -  BMI 01.75 - -  Systolic - 102 585  Diastolic - 82 83  Pulse - 69 67    Orthostatic VS for the past 72 hrs (Last 3 readings):  Orthostatic BP Patient Position BP Location Cuff Size Orthostatic Pulse  06/12/19 1115 136/81 Sitting Left Arm Large 85  06/12/19 1114 132/77 Sitting Left Arm Large 80  06/12/19 1101 144/77 Supine Left Arm Large 81     Physical Exam  Cardiovascular: Normal rate, regular rhythm, normal heart sounds and intact distal pulses. Exam reveals no gallop.  No murmur heard. No leg edema, no JVD.  Pulmonary/Chest: Effort normal and breath  sounds normal.  Abdominal: Soft. Bowel sounds are normal.   Laboratory examination:   Recent Labs    06/20/18 2100 09/21/18 1840 12/14/18 1910  NA 139 140 134*  K 2.8* 3.8 4.0  CL 101 103 101  CO2 _1 GLUCOSE 134* 104* 108*  BUN _2 CREATININE 1.01* 1.15* 0.97  CALCIUM 9.6 9.1 8.5*  GFRNONAA 54* 46* 57*  GFRAA >60 54* >60   CrCl cannot be calculated (Patient's most recent lab result is older than the maximum 21 days allowed.).  CMP Latest Ref Rng & Units 12/14/2018 09/21/2018 06/20/2018  Glucose 70 - 99 mg/dL 108(H) 104(H) 134(H)  BUN 8 - 23 mg/dL _3 Creatinine 0.44 - 1.00 mg/dL 0.97 1.15(H) 1.01(H)  Sodium 135 - 145 mmol/L 134(L) 140 139  Potassium 3.5 - 5.1 mmol/L 4.0 3.8 2.8(L)  Chloride 98 - 111 mmol/L 101 103 101  CO2 22 - 32 mmol/L _4 Calcium 8.9 - 10.3 mg/dL 8.5(L) 9.1 9.6  Total Protein 6.5 - 8.1 g/dL 5.6(L) - -  Total Bilirubin 0.3 - 1.2 mg/dL 0.2(L) - -  Alkaline Phos 38 - 126 U/L 47 - -  AST 15 - 41 U/L 27 - -  ALT 0 - 44 U/L 15 - -   CBC Latest Ref Rng & Units 12/14/2018 09/21/2018 06/20/2018  WBC 4.0 - 10.5 K/uL 5.6 9.2 12.3(H)  Hemoglobin 12.0 - 15.0 g/dL 9.3(L) 11.4(L) 12.6  Hematocrit 36.0 - 46.0 % 29.4(L) 35.5(L) 40.0  Platelets 150 - 400 K/uL 188 198 210   Lipid Panel     Component Value Date/Time   CHOL 165 11/24/2016 0040   TRIG 88 11/24/2016 0040   HDL 61 11/24/2016 0040   CHOLHDL 2.7 11/24/2016 0040   VLDL 18 11/24/2016 0040   LDLCALC 86 11/24/2016 0040   HEMOGLOBIN A1C Lab Results  Component Value Date   HGBA1C 5.6 11/24/2016   MPG 114 11/24/2016   TSH Recent Labs    12/14/18 1905  TSH 0.855    External labs:   Cholesterol, total 165.000 11/24/2016 HDL 79.000 02/22/2019 LDL 86.000 11/24/2016 Triglycerides 75.000 02/22/2019  TSH 0.450 02/22/2019  Hemoglobin 9.300 12/14/2018;  INR 1.160 11/06/2017 Platelets 188.000 12/14/2018  Creatinine, Serum 0.970 12/14/2018 Potassium 4.000 12/14/2018 Magnesium N/D ALT  (SGPT) 15.000 12/14/2018  Labs 11/02/2018: BUN 26, creatinine 0.9, EGFR greater than 60 mL, Hb 10.6/HCT 32.2, platelets 244.   Medications and allergies   Allergies  Allergen Reactions  . Avelox [Moxifloxacin Hcl In Nacl] Other (See Comments)    unk  . Alendronate Sodium Nausea Only     Current Outpatient Medications  Medication Instructions  . acetaminophen (  TYLENOL) 500 mg, Oral, 2 times daily  . Alpha-Lipoic Acid (LIPOIC ACID PO) 2 tablets, Oral, 2 times daily  . amoxicillin (AMOXIL) 500 MG capsule TAKE 1 CAPSULE BY MOUTH EVERY 12 HOURS FOR 7 DAYS  . atorvastatin (LIPITOR) 10 MG tablet TAKE 1 TABLET BY MOUTH EVERY DAY  . Calcium Carbonate-Vitamin D3 600-400 MG-UNIT TABS 1 tablet, Oral, 2 times daily  . cholecalciferol (VITAMIN D) 1,000 Units, Oral, Daily  . diltiazem (CARDIZEM CD) 120 MG 24 hr capsule TAKE 1 CAPSULE BY MOUTH DAILY *NEED OFFICE VISIT FOR REFILLS*  . DULoxetine (CYMBALTA) 30 MG capsule TAKE 1 CAPSULE BY MOUTH TWICE A DAY  . ELIQUIS 5 MG TABS tablet TAKE 1 TABLET BY MOUTH TWICE A DAY  . GLUCOSAMINE-CHONDROITIN PO 2 tablets, Oral, Daily with lunch, Glucosamine 1517m Chondroitin 12032m . Lutein-Zeaxanthin 25-5 MG CAPS 1 tablet, Oral, Daily  . Melatonin 3 mg, Oral, Daily at bedtime  . Multiple Vitamin (MULTIVITAMIN WITH MINERALS) TABS tablet 1 tablet, Oral, Daily  . nitroGLYCERIN (NITROLINGUAL) 0.4 MG/SPRAY spray 1 spray, Sublingual, Every 5 min x3 PRN  . Omega-3 1,000 mg, Oral, Daily  . pantoprazole (PROTONIX) 40 mg, Oral, Daily before breakfast  . sucralfate (CARAFATE) 1 g, Oral, 3 times daily  . thyroid (ARMOUR) 60 mg, Oral, Daily before breakfast  . tiZANidine (ZANAFLEX) 4 mg, Oral, 3 times daily PRN  . vitamin C (ASCORBIC ACID) 1,000 mg, Oral, Daily    Radiology:   No results found.  Cardiac Studies:   Event Monitor for 30 days Start date 07/01/2018 for syncope : A. Fib one episode, no symptoms reported. No heart block or significant bradycardia  Carotid  duplex 03/27/2018:  Right Carotid: Velocities in the right ICA are consistent with a 1-39% stenosis. Left Carotid: Velocities in the left ICA are consistent with a 1-39% stenosis. Vertebrals:  Bilateral vertebral arteries demonstrate antegrade flow. Subclavians: Normal flow hemodynamics were seen in bilateral subclavian arteries.   Event Monitor 30 days 11/11/2017: Predominant rhythm is normal sinus rhythm. Symptoms of fatigue reveals normal sinus rhythm. Asymptomatic atrial fibrillation and probable atypical atrial flutter noted on 11/13/2017, 11/18/2017 and 12/09/2017 at 3:00 AM. Occasional PACs. Arrhythmia/PVC burden 6%.   Echo 11/09/2017: Left ventricle: The cavity size was normal. Systolic function was   normal. The estimated ejection fraction was in the range of 60% to 65%. Wall motion was normal; there were no regional wall   motion abnormalities. The study is not technically sufficient to   allow evaluation of LV diastolic function.Pulmonary arteries: PA peak pressure: 32 mm Hg (S).   Coronary angiogram 11/24/2016:  Normal coronaries; tortuous LAD  EKG 06/12/2019: Normal sinus rhythm at the rate of 77 bpm, normal axis, incomplete right bundle branch block.  Nonspecific T abnormality.  Normal QT interval. No significant change from  EKG 12/07/2018: left axis deviation, left anterior fascicular block not present.   EKG 06/20/2018: Atrial tachycardia at the rate of 133 bpm.  Left axis deviation, left anterior fascicular block.  Poor R-wave progression.  Incomplete right bundle branch block.  Assessment     ICD-10-CM   1. Paroxysmal atrial fibrillation ([place] CHA2DS2-VASc Score is 4.  Yearly risk of stroke: 4% (A, F, HTN,).     I48.0 EKG 12-Lead  2. Orthostatic hypotension  I95.1   3. Essential hypertension, benign  I10   4. Chronic anemia  D64.9     No orders of the defined types were placed in this encounter.   Medications Discontinued During This Encounter  Medication Reason  .  cephALEXin (KEFLEX) 500 MG capsule Error  . mirabegron ER (MYRBETRIQ) 50 MG TB24 tablet Error     Recommendations:   Cynthia [name]  is a 79 y.o.Caucasian female with hypertension, hyperlipidemia, frequent UTI,  paroxysmal atrial fibrillation, atrial tachycardia, obstructive sleep apnea on CPAP. She has surgery planned 06/26/19, colporrhaphy at [facility] anticoagulation well. Flecainide discontinued due to dizziness. No recurrence of AF.   She has surgery planned 06/26/19, colporrhaphy at [facility]. She was not orthostatic on her last office visit and also today and suspect her near syncope and severe dizziness is clearly related and UTI episodes.  Hopefully colporrhaphy will help her prevent recurrent UTI.  No change in the medications were done by me today, she is maintaining sinus rhythm, BP is controlled, surgical clearance letter has already been sent to the surgeon.  I will see her back in 6 months or sooner if problems. Foot pain is from callous in her feet.   Labs reviewed. She has chronic anemia and Hb has remained stable. Not sure if she has had complete work-up for the same.   Adrian Prows, MD, [facility] 06/13/2019, 6:30 AM [facility]. [place] Office: [phone]

## 2019-06-19 DIAGNOSIS — Z20828 Contact with and (suspected) exposure to other viral communicable diseases: Secondary | ICD-10-CM | POA: Diagnosis not present

## 2019-06-19 DIAGNOSIS — Z20822 Contact with and (suspected) exposure to covid-19: Secondary | ICD-10-CM | POA: Diagnosis not present

## 2019-06-20 ENCOUNTER — Encounter: Payer: Self-pay | Admitting: Podiatry

## 2019-06-20 ENCOUNTER — Ambulatory Visit (INDEPENDENT_AMBULATORY_CARE_PROVIDER_SITE_OTHER): Payer: Medicare PPO | Admitting: Podiatry

## 2019-06-20 ENCOUNTER — Ambulatory Visit (INDEPENDENT_AMBULATORY_CARE_PROVIDER_SITE_OTHER): Payer: Medicare PPO

## 2019-06-20 ENCOUNTER — Other Ambulatory Visit: Payer: Self-pay

## 2019-06-20 VITALS — Temp 97.5°F

## 2019-06-20 DIAGNOSIS — M898X9 Other specified disorders of bone, unspecified site: Secondary | ICD-10-CM

## 2019-06-20 DIAGNOSIS — M2141 Flat foot [pes planus] (acquired), right foot: Secondary | ICD-10-CM

## 2019-06-20 DIAGNOSIS — M2142 Flat foot [pes planus] (acquired), left foot: Secondary | ICD-10-CM

## 2019-06-20 DIAGNOSIS — Z7901 Long term (current) use of anticoagulants: Secondary | ICD-10-CM

## 2019-06-20 DIAGNOSIS — M79675 Pain in left toe(s): Secondary | ICD-10-CM

## 2019-06-20 DIAGNOSIS — L84 Corns and callosities: Secondary | ICD-10-CM | POA: Diagnosis not present

## 2019-06-20 DIAGNOSIS — M79671 Pain in right foot: Secondary | ICD-10-CM

## 2019-06-20 DIAGNOSIS — M79672 Pain in left foot: Secondary | ICD-10-CM

## 2019-06-20 DIAGNOSIS — B351 Tinea unguium: Secondary | ICD-10-CM | POA: Diagnosis not present

## 2019-06-20 DIAGNOSIS — M79674 Pain in right toe(s): Secondary | ICD-10-CM | POA: Diagnosis not present

## 2019-06-20 NOTE — Progress Notes (Signed)
Subjective: 79 year old female presents the office today for concerns of recurrent painful calluses to both of her feet with the left side worse than the right as well as for thick, discolored toenails that she cannot trim her self.  She was discussed more permanent options to help with the calluses that they are causing pain.  She denies any skin breakdown denies any redness or drainage or any swelling.  Denies any redness or drainage from the toenail sites with the calluses.  She has no other concerns today.  She is on Eliquis   Objective: AAO x3, NAD DP/PT pulses palpable bilaterally, CRT less than 3 seconds Tenderness present on the plantar medial aspect of foot is a hyperkeratotic lesion bilaterally left side worse than the right.  This is under the area of bony prominence given her significant flatfoot deformity.  There is no underlying ulceration, drainage or any signs of infection. Nails are hypertrophic, dystrophic, brittle, discolored, elongated 10. No surrounding redness or drainage. Tenderness nails 1-5 bilaterally. No open lesions or pre-ulcerative lesions are identified today. Significant flatfoot is evident. No pain with calf compression, swelling, warmth, erythema  Assessment: 79 year old female with symptomatic hyperkeratotic lesions; symptomatic onychomycosis  Plan: -All treatment options discussed with the patient including all alternatives, risks, complications.  -X-rays obtained and reviewed.  Skin marker was utilized to identify the area of abscess hyperkeratotic tissue.  Crepitus present with bony exostosis underlying the area.  Notes of acute fracture or foreign body. -Discussed with conservative as well as surgical treatment options.  Wearing continue conservative care for now and have her follow-up with Betha in order to help offload the area with an orthotic.  Possible adjustment to her current inserts. -Lesions were sharply debrided x2 without any complications or  bleeding.  Continue offloading at all times and continue with moisturizer to the area daily.   -Nails debrided x10 without any complications or bleeding -Patient encouraged to call the office with any questions, concerns, change in symptoms.   Trula Slade DPM

## 2019-06-22 ENCOUNTER — Ambulatory Visit: Payer: Medicare PPO | Admitting: Podiatry

## 2019-06-26 DIAGNOSIS — F0390 Unspecified dementia without behavioral disturbance: Secondary | ICD-10-CM | POA: Diagnosis not present

## 2019-06-26 DIAGNOSIS — Z7901 Long term (current) use of anticoagulants: Secondary | ICD-10-CM | POA: Diagnosis not present

## 2019-06-26 DIAGNOSIS — G4733 Obstructive sleep apnea (adult) (pediatric): Secondary | ICD-10-CM | POA: Diagnosis not present

## 2019-06-26 DIAGNOSIS — N813 Complete uterovaginal prolapse: Secondary | ICD-10-CM | POA: Diagnosis not present

## 2019-06-26 DIAGNOSIS — N398 Other specified disorders of urinary system: Secondary | ICD-10-CM | POA: Diagnosis not present

## 2019-06-26 DIAGNOSIS — I4891 Unspecified atrial fibrillation: Secondary | ICD-10-CM | POA: Diagnosis not present

## 2019-06-26 DIAGNOSIS — R339 Retention of urine, unspecified: Secondary | ICD-10-CM | POA: Diagnosis not present

## 2019-06-26 DIAGNOSIS — R3914 Feeling of incomplete bladder emptying: Secondary | ICD-10-CM | POA: Diagnosis not present

## 2019-06-26 DIAGNOSIS — N8110 Cystocele, unspecified: Secondary | ICD-10-CM | POA: Diagnosis not present

## 2019-06-26 DIAGNOSIS — Q846 Other congenital malformations of nails: Secondary | ICD-10-CM | POA: Diagnosis not present

## 2019-06-26 DIAGNOSIS — N993 Prolapse of vaginal vault after hysterectomy: Secondary | ICD-10-CM | POA: Diagnosis not present

## 2019-06-26 DIAGNOSIS — I10 Essential (primary) hypertension: Secondary | ICD-10-CM | POA: Diagnosis not present

## 2019-06-26 DIAGNOSIS — Z87440 Personal history of urinary (tract) infections: Secondary | ICD-10-CM | POA: Diagnosis not present

## 2019-06-30 ENCOUNTER — Encounter (HOSPITAL_BASED_OUTPATIENT_CLINIC_OR_DEPARTMENT_OTHER): Payer: Self-pay | Admitting: Emergency Medicine

## 2019-06-30 ENCOUNTER — Emergency Department (HOSPITAL_BASED_OUTPATIENT_CLINIC_OR_DEPARTMENT_OTHER)
Admission: EM | Admit: 2019-06-30 | Discharge: 2019-06-30 | Disposition: A | Discharge status: Home / Self Care | Payer: Medicare PPO | Attending: Emergency Medicine | Admitting: Emergency Medicine

## 2019-06-30 ENCOUNTER — Other Ambulatory Visit: Payer: Self-pay

## 2019-06-30 DIAGNOSIS — Z7901 Long term (current) use of anticoagulants: Secondary | ICD-10-CM | POA: Insufficient documentation

## 2019-06-30 DIAGNOSIS — I10 Essential (primary) hypertension: Secondary | ICD-10-CM

## 2019-06-30 DIAGNOSIS — Z79899 Other long term (current) drug therapy: Secondary | ICD-10-CM | POA: Insufficient documentation

## 2019-06-30 DIAGNOSIS — Z87891 Personal history of nicotine dependence: Secondary | ICD-10-CM | POA: Diagnosis not present

## 2019-06-30 DIAGNOSIS — R52 Pain, unspecified: Secondary | ICD-10-CM | POA: Diagnosis not present

## 2019-06-30 DIAGNOSIS — Z466 Encounter for fitting and adjustment of urinary device: Secondary | ICD-10-CM | POA: Diagnosis not present

## 2019-06-30 DIAGNOSIS — R339 Retention of urine, unspecified: Secondary | ICD-10-CM

## 2019-06-30 DIAGNOSIS — K5900 Constipation, unspecified: Secondary | ICD-10-CM | POA: Insufficient documentation

## 2019-06-30 DIAGNOSIS — N3000 Acute cystitis without hematuria: Secondary | ICD-10-CM | POA: Diagnosis not present

## 2019-06-30 DIAGNOSIS — R338 Other retention of urine: Secondary | ICD-10-CM | POA: Diagnosis not present

## 2019-06-30 LAB — CBC
HCT: 33.7 % — ABNORMAL LOW (ref 36.0–46.0)
Hemoglobin: 10.6 g/dL — ABNORMAL LOW (ref 12.0–15.0)
MCH: 28.5 pg (ref 26.0–34.0)
MCHC: 31.5 g/dL (ref 30.0–36.0)
MCV: 90.6 fL (ref 80.0–100.0)
Platelets: 233 10*3/uL (ref 150–400)
RBC: 3.72 MIL/uL — ABNORMAL LOW (ref 3.87–5.11)
RDW: 14.1 % (ref 11.5–15.5)
WBC: 11 10*3/uL — ABNORMAL HIGH (ref 4.0–10.5)
nRBC: 0 % (ref 0.0–0.2)

## 2019-06-30 LAB — URINALYSIS, ROUTINE W REFLEX MICROSCOPIC
Bilirubin Urine: NEGATIVE
Glucose, UA: NEGATIVE mg/dL
Ketones, ur: NEGATIVE mg/dL
Nitrite: POSITIVE — AB
Protein, ur: NEGATIVE mg/dL
Specific Gravity, Urine: 1.01 (ref 1.005–1.030)
pH: 6.5 (ref 5.0–8.0)

## 2019-06-30 LAB — BASIC METABOLIC PANEL
Anion gap: 10 (ref 5–15)
BUN: 16 mg/dL (ref 8–23)
CO2: 25 mmol/L (ref 22–32)
Calcium: 8.9 mg/dL (ref 8.9–10.3)
Chloride: 101 mmol/L (ref 98–111)
Creatinine, Ser: 0.67 mg/dL (ref 0.44–1.00)
GFR calc Af Amer: >60 mL/min (ref >60)
GFR calc non Af Amer: >60 mL/min (ref >60)
Glucose, Bld: 118 mg/dL — ABNORMAL HIGH (ref 70–99)
Potassium: 3.2 mmol/L — ABNORMAL LOW (ref 3.5–5.1)
Sodium: 136 mmol/L (ref 135–145)

## 2019-06-30 LAB — URINALYSIS, MICROSCOPIC (REFLEX)

## 2019-06-30 MED ORDER — CEPHALEXIN 250 MG PO CAPS: 250.0000 mg | ORAL_CAPSULE | Freq: Three times a day (TID) | ORAL | 0 refills | Status: AC

## 2019-06-30 NOTE — ED Provider Notes (Signed)
[place] EMERGENCY DEPARTMENT Provider Note   CSN: [id number] Arrival date & time: 06/30/19  1652     History Chief Complaint  Patient presents with  . Urinary Retention  . Constipation    Cynthia [name] is a 79 y.o. female.  HPI   Patient presented to the ED for evaluation of urinary retention.  Patient  had an anterior colporrhaphy on March 15.  Patient had an indwelling Foley catheter following that procedure.  Patient went to her doctor's office to have her catheter removed.  Unable to void in the office after removal of the catheter.  Her doctor recommended replacement of the catheter but the patient felt like she would likely be able to go home and urinate.  She was instructed if she was not able to urinate by this afternoon to come to emergency room to have the catheter replaced.  Patient started having worsening symptoms.  She was unable to urinate.  She is also feeling constipated.  Patient upon arrival to the ED had a Foley catheter placed.  Patient states she also has had a bowel movement.  She is feeling much better now.  Past Medical History:  Diagnosis Date  . Acute blood loss anemia 10/27/2017  . Acute GI bleeding 10/27/2017  . Anemia   . Arthritis    "joints" (11/10/2017)  . Dizziness   . GERD (gastroesophageal reflux disease)   . GI bleed 11/06/2017  . Hearing loss   . History of blood transfusion 10/2017  . Hyperlipidemia   . Hyperthyroidism   . Melanoma ([place])    "cut off my back"  . Memory loss   . Sinus headache   . Sleep apnea     Patient Active Problem List   Diagnosis Date Noted  . Nocturia 07/01/2018  . Midline cystocele 06/08/2018  . Recurrent UTI 06/08/2018  . Vaginal vault prolapse 06/08/2018  . Lightheadedness 06/02/2018  . Near syncope   . Hypotension 03/17/2018  . Postural dizziness with presyncope 03/17/2018  . PAF (paroxysmal atrial fibrillation) ([place]) 03/17/2018  . Memory loss 11/25/2017  . Acute lower UTI 10/29/2017  .  Abnormal stress test   . Chest pain 11/23/2016  . Hyperlipidemia 11/23/2016  . GERD (gastroesophageal reflux disease) 11/23/2016  . History of recurrent UTIs 11/23/2016  . Polyneuropathy 10/30/2016  . Bursitis, subacromial 10/30/2016  . Numbness 10/30/2016  . Arthritis, senescent 01/03/2016  . Left knee pain 01/03/2016  . Pain in joint, ankle and foot 02/28/2015  . Dry mouth 08/07/2014  . Dry eyes 08/07/2014  . Other headache syndrome 06/25/2014  . Sinusitis, chronic 06/25/2014  . Neck pain 06/25/2014  . OSA (obstructive sleep apnea) 06/25/2014  . Essential hypertension, benign 06/25/2014    Past Surgical History:  Procedure Laterality Date  . ABDOMINAL HYSTERECTOMY     "partial"  . APPENDECTOMY    . BIOPSY  11/17/2017   Procedure: BIOPSY;  Surgeon: Clarene Essex, MD;  Location: [facility];  Service: Endoscopy;;  . COLONOSCOPY WITH PROPOFOL N/A 11/17/2017   Procedure: COLONOSCOPY WITH PROPOFOL;  Surgeon: Clarene Essex, MD;  Location: [facility];  Service: Endoscopy;  Laterality: N/A;  . CRANIECTOMY FOR EXCISION OF ACOUSTIC NEUROMA    . ESOPHAGOGASTRODUODENOSCOPY (EGD) WITH PROPOFOL N/A 10/28/2017   Procedure: ESOPHAGOGASTRODUODENOSCOPY (EGD) WITH PROPOFOL;  Surgeon: Clarene Essex, MD;  Location: [facility];  Service: Endoscopy;  Laterality: N/A;  . KNEE ARTHROSCOPY Left   . LEFT HEART CATH AND CORONARY ANGIOGRAPHY N/A 11/24/2016   Procedure: LEFT HEART  CATH AND CORONARY ANGIOGRAPHY;  Surgeon: Jettie Booze, MD;  Location: [facility];  Service: Cardiovascular;  Laterality: N/A;  . MELANOMA EXCISION     "off my back"  . NASAL SINUS SURGERY    . TONSILLECTOMY       OB History   No obstetric history on file.     Family History  Problem Relation Age of Onset  . Congestive Heart Failure Mother   . Heart disease Father        History of heart attacks at a later age.    Marland Kitchen Heart disease Brother   . Hearing loss Brother     Social History   Tobacco Use  .  Smoking status: Former Smoker    Years: 2.00    Types: Cigarettes    Quit date: 1980    Years since quitting: 41.2  . Smokeless tobacco: Never Used  . Tobacco comment: 11/10/2017 "only smoked when I was on my period"  Substance Use Topics  . Alcohol use: Yes    Comment: 11/10/2017 "nothing lately; did have 1 glass of wine q night"  . Drug use: Never    Home Medications Prior to Admission medications   Medication Sig Start Date End Date Taking? Authorizing Provider  acetaminophen (TYLENOL) 500 MG tablet Take 500 mg by mouth 2 (two) times daily.     [provider]  Alpha-Lipoic Acid (LIPOIC ACID PO) Take 2 tablets by mouth 2 (two) times daily.     [provider]  amoxicillin (AMOXIL) 500 MG capsule TAKE 1 CAPSULE BY MOUTH EVERY 12 HOURS FOR 7 DAYS 12/20/18   [provider]  atorvastatin (LIPITOR) 10 MG tablet TAKE 1 TABLET BY MOUTH EVERY DAY 04/25/19   Adrian Prows, MD  Calcium Carbonate-Vitamin D3 600-400 MG-UNIT TABS Take 1 tablet by mouth 2 (two) times a day.     [provider]  cephALEXin (KEFLEX) 250 MG capsule Take 1 capsule (250 mg total) by mouth 3 (three) times daily for 7 days. 06/30/19 07/07/19  Dorie Rank, MD  cholecalciferol (VITAMIN D) 1000 UNITS tablet Take 1,000 Units by mouth daily.    [provider]  diltiazem (CARDIZEM CD) 120 MG 24 hr capsule TAKE 1 CAPSULE BY MOUTH DAILY *NEED OFFICE VISIT FOR REFILLS* 01/02/19   Adrian Prows, MD  diltiazem [facility]) 120 MG 24 hr capsule Take by mouth. 04/18/18   [provider]  DULoxetine (CYMBALTA) 30 MG capsule TAKE 1 CAPSULE BY MOUTH TWICE A DAY Patient taking differently: Take 30 mg by mouth 2 (two) times daily.  11/10/18   Sater, Nanine Means, MD  ELIQUIS 5 MG TABS tablet TAKE 1 TABLET BY MOUTH TWICE A DAY 05/08/19   Adrian Prows, MD  fluticasone [facility]) 50 MCG/ACT nasal spray Place into the nose.    [provider]  GLUCOSAMINE-CHONDROITIN PO Take 2 tablets by mouth daily with  lunch. Glucosamine 1500mg  Chondroitin 1200mg     [provider]  Lutein-Zeaxanthin 25-5 MG CAPS Take 1 tablet by mouth daily.    [provider]  Melatonin 3 MG TABS Take 3 mg by mouth at bedtime.    [provider]  Multiple Vitamin (MULTIVITAMIN WITH MINERALS) TABS tablet Take 1 tablet by mouth daily.    [provider]  nitroGLYCERIN (NITROLINGUAL) 0.4 MG/SPRAY spray Place 1 spray under the tongue every 5 (five) minutes x 3 doses as needed for chest pain.    [provider]  Omega-3 1000 MG CAPS  Take 1,000 mg by mouth daily.    [provider]  pantoprazole (PROTONIX) 40 MG tablet Take 40 mg by mouth daily before breakfast.  06/12/14   [provider]  simethicone (MYLICON) 0000000 MG chewable tablet Chew by mouth.    [provider]  sucralfate (CARAFATE) 1 g tablet Take 1 g by mouth 3 (three) times daily.     [provider]  thyroid (ARMOUR) 60 MG tablet Take 60 mg by mouth daily before breakfast. 11/07/17   [provider]  tiZANidine (ZANAFLEX) 4 MG tablet Take 1 tablet (4 mg total) by mouth 3 (three) times daily as needed. 02/09/19   Sater, Nanine Means, MD  vitamin C (ASCORBIC ACID) 500 MG tablet Take 1,000 mg by mouth daily.     [provider]    Allergies    Avelox [moxifloxacin hcl in nacl] and Alendronate sodium  Review of Systems   Review of Systems  All other systems reviewed and are negative.   Physical Exam Updated Vital Signs BP (!) 188/103 (BP Location: Right Arm)   Pulse 88   Temp 98.3 F (36.8 C) (Oral)   Resp 16   Ht 1.651 m (5\' 5" )   Wt 72.5 kg   SpO2 97%   BMI 26.60 kg/m   Physical Exam Vitals and nursing note reviewed.  Constitutional:      General: She is not in acute distress.    Appearance: She is well-developed.  HENT:     Head: Normocephalic and atraumatic.     Right Ear: External ear normal.     Left Ear: External ear normal.  Eyes:     General: No  scleral icterus.       Right eye: No discharge.        Left eye: No discharge.     Conjunctiva/sclera: Conjunctivae normal.  Neck:     Trachea: No tracheal deviation.  Cardiovascular:     Rate and Rhythm: Normal rate and regular rhythm.  Pulmonary:     Effort: Pulmonary effort is normal. No respiratory distress.     Breath sounds: Normal breath sounds. No stridor. No wheezing or rales.  Abdominal:     General: Bowel sounds are normal. There is no distension.     Palpations: Abdomen is soft.     Tenderness: There is no abdominal tenderness. There is no guarding or rebound.  Genitourinary:    Comments: Foley catheter in place, large volume yellow urine noted in catheter bag Musculoskeletal:        General: No tenderness.     Cervical back: Neck supple.  Skin:    General: Skin is warm and dry.     Findings: No rash.  Neurological:     Mental Status: She is alert.     Cranial Nerves: No cranial nerve deficit (no facial droop, extraocular movements intact, no slurred speech).     Sensory: No sensory deficit.     Motor: No abnormal muscle tone or seizure activity.     Coordination: Coordination normal.     ED Results / Procedures / Treatments   Labs (all labs ordered are listed, but only abnormal results are displayed) Labs Reviewed  CBC - Abnormal; Notable for the following components:      Result Value   WBC 11.0 (*)    RBC 3.72 (*)    Hemoglobin 10.6 (*)    HCT 33.7 (*)    All other components within normal limits  BASIC METABOLIC  PANEL - Abnormal; Notable for the following components:   Potassium 3.2 (*)    Glucose, Bld 118 (*)    All other components within normal limits  URINALYSIS, ROUTINE W REFLEX MICROSCOPIC - Abnormal; Notable for the following components:   APPearance CLOUDY (*)    Hgb urine dipstick TRACE (*)    Nitrite POSITIVE (*)    Leukocytes,Ua SMALL (*)    All other components within normal limits  URINALYSIS, MICROSCOPIC (REFLEX) - Abnormal; Notable  for the following components:   Bacteria, UA MANY (*)    All other components within normal limits  URINE CULTURE     Procedures Procedures (including critical care time)  Medications Ordered in ED Medications - No data to display  ED Course  I have reviewed the triage vital signs and the nursing notes.  Pertinent labs & imaging results that were available during my care of the patient were reviewed by me and considered in my medical decision making (see chart for details).  Clinical Course as of Jun 29 1833  Fri Jun 30, 2019  [place] reviewed.  Anemia appears stable.  Urinalysis does suggest possibility of urinary tract infection   [JK]  1835 Bp 156/88   [JK]    Clinical Course User Index [JK] Dorie Rank, MD   MDM Rules/Calculators/A&P                      Patient presented to the ED for evaluation of urinary retention after having her Foley catheter taken out today.  Patient symptoms improved after she had placement of her Foley catheter.  Laboratory tests are unremarkable with the exception of a urinalysis suggesting urinary tract infection.  Plan to discharge home with a course phonics.  Outpatient follow-up with her urologist.  Patient be discharged home with Foley catheter in place  Patient noted ED.  Likely related to her pain and discomfort.  Improved without intervention Final Clinical Impression(s) / ED Diagnoses Final diagnoses:  Urinary retention  Acute cystitis without hematuria  Hypertension, unspecified type    Rx / DC Orders ED Discharge Orders         Ordered    cephALEXin (KEFLEX) 250 MG capsule  3 times daily     06/30/19 1829           Dorie Rank, MD 06/30/19 [phone]

## 2019-06-30 NOTE — ED Triage Notes (Signed)
Per EMS:  Pt had procedure done on Monday on her bladder.  Pt had catheter placed at that time.  Today she went to MD office and had catheter removed.  She was told to go to ED if she could not urinate by 1300.  Pt fell asleep and woke up at 1600 and then came here.  Pt also c/o constipation, unable to have BM

## 2019-06-30 NOTE — Discharge Instructions (Addendum)
Take the antibiotics as prescribed, follow up with your doctor to decide on when it is appropriate to remove the foley catheter

## 2019-07-04 DIAGNOSIS — N9989 Other postprocedural complications and disorders of genitourinary system: Secondary | ICD-10-CM | POA: Diagnosis not present

## 2019-07-04 DIAGNOSIS — Z466 Encounter for fitting and adjustment of urinary device: Secondary | ICD-10-CM | POA: Diagnosis not present

## 2019-07-04 DIAGNOSIS — R82998 Other abnormal findings in urine: Secondary | ICD-10-CM | POA: Diagnosis not present

## 2019-07-04 DIAGNOSIS — R338 Other retention of urine: Secondary | ICD-10-CM | POA: Diagnosis not present

## 2019-07-04 LAB — URINE CULTURE: Culture: >=100000 — AB

## 2019-07-05 ENCOUNTER — Telehealth: Payer: Self-pay | Admitting: *Deleted

## 2019-07-05 NOTE — Telephone Encounter (Signed)
Post ED Visit - Positive Culture Follow-up  Culture report reviewed by antimicrobial stewardship pharmacist: [place] Team []  Elenor Quinones, Pharm.D. []  Heide Guile, Pharm.D., BCPS AQ-ID []  Parks Neptune, Pharm.D., BCPS []  Alycia Rossetti, Pharm.D., BCPS []  [place], Pharm.D., BCPS, AAHIVP []  Legrand Como, Pharm.D., BCPS, AAHIVP []  Salome Arnt, PharmD, BCPS []  Johnnette Gourd, PharmD, BCPS []  Hughes Better, PharmD, BCPS []  Leeroy Cha, PharmD []  Laqueta Linden, PharmD, BCPS []  Albertina Parr, PharmD Joellyn Quails, PharmD  [place] Team []  Leodis Sias, PharmD []  Lindell Spar, PharmD []  Royetta Asal, PharmD []  Graylin Shiver, Rph []  Rema Fendt) Glennon Mac, PharmD []  Arlyn Dunning, PharmD []  Netta Cedars, PharmD []  Dia Sitter, PharmD []  Leone Haven, PharmD []  Gretta Arab, PharmD []  Theodis Shove, PharmD []  Peggyann Juba, PharmD []  Reuel Boom, PharmD   Positive urine culture Treated with Cephalexin, organism sensitive to the same and no further patient follow-up is required at this time.  Harlon Flor [facility] 07/05/2019, 11:32 AM

## 2019-07-10 ENCOUNTER — Other Ambulatory Visit: Payer: Self-pay

## 2019-07-10 ENCOUNTER — Ambulatory Visit: Payer: Medicare PPO | Admitting: Orthotics

## 2019-07-10 DIAGNOSIS — M898X9 Other specified disorders of bone, unspecified site: Secondary | ICD-10-CM

## 2019-07-10 DIAGNOSIS — L84 Corns and callosities: Secondary | ICD-10-CM

## 2019-07-10 NOTE — Progress Notes (Signed)
Added more plastizote for offload bony prominence.

## 2019-07-13 ENCOUNTER — Telehealth: Payer: Self-pay

## 2019-07-13 NOTE — Telephone Encounter (Addendum)
Telephone encounter:  Reason for call: Feels like she is in A-fib. She feels nauseas, dizzy, weak, clammy.  Taking Tramadol and motrin for pain. BP 106/72 Hr 75  Usual provider: [facility]  Last office visit: 06/12/19  Next office visit: 12/13/2019   Last hospitalization: 06/26/2019 outpatient [facility] (Bladder)  Current Outpatient Medications on File Prior to Visit  Medication Sig Dispense Refill  . acetaminophen (TYLENOL) 500 MG tablet Take 500 mg by mouth 2 (two) times daily.     . Alpha-Lipoic Acid (LIPOIC ACID PO) Take 2 tablets by mouth 2 (two) times daily.     Marland Kitchen amoxicillin (AMOXIL) 500 MG capsule TAKE 1 CAPSULE BY MOUTH EVERY 12 HOURS FOR 7 DAYS    . atorvastatin (LIPITOR) 10 MG tablet TAKE 1 TABLET BY MOUTH EVERY DAY 90 tablet 3  . Calcium Carbonate-Vitamin D3 600-400 MG-UNIT TABS Take 1 tablet by mouth 2 (two) times a day.     . cholecalciferol (VITAMIN D) 1000 UNITS tablet Take 1,000 Units by mouth daily.    Marland Kitchen diltiazem (CARDIZEM CD) 120 MG 24 hr capsule TAKE 1 CAPSULE BY MOUTH DAILY *NEED OFFICE VISIT FOR REFILLS* 30 capsule 5  . diltiazem (TIAZAC) 120 MG 24 hr capsule Take by mouth.    . DULoxetine (CYMBALTA) 30 MG capsule TAKE 1 CAPSULE BY MOUTH TWICE A DAY (Patient taking differently: Take 30 mg by mouth 2 (two) times daily. ) 180 capsule 3  . ELIQUIS 5 MG TABS tablet TAKE 1 TABLET BY MOUTH TWICE A DAY 60 tablet 5  . fluticasone (FLONASE) 50 MCG/ACT nasal spray Place into the nose.    Marland Kitchen GLUCOSAMINE-CHONDROITIN PO Take 2 tablets by mouth daily with lunch. Glucosamine 1500mg  Chondroitin 1200mg     . Lutein-Zeaxanthin 25-5 MG CAPS Take 1 tablet by mouth daily.    . Melatonin 3 MG TABS Take 3 mg by mouth at bedtime.    . Multiple Vitamin (MULTIVITAMIN WITH MINERALS) TABS tablet Take 1 tablet by mouth daily.    . nitroGLYCERIN (NITROLINGUAL) 0.4 MG/SPRAY spray Place 1 spray under the tongue every 5 (five) minutes x 3 doses as needed for chest pain.    . Omega-3 1000 MG CAPS Take  1,000 mg by mouth daily.    . pantoprazole (PROTONIX) 40 MG tablet Take 40 mg by mouth daily before breakfast.   1  . simethicone (MYLICON) 0000000 MG chewable tablet Chew by mouth.    . sucralfate (CARAFATE) 1 g tablet Take 1 g by mouth 3 (three) times daily.     Marland Kitchen thyroid (ARMOUR) 60 MG tablet Take 60 mg by mouth daily before breakfast.    . tiZANidine (ZANAFLEX) 4 MG tablet Take 1 tablet (4 mg total) by mouth 3 (three) times daily as needed. 90 tablet 5  . vitamin C (ASCORBIC ACID) 500 MG tablet Take 1,000 mg by mouth daily.      No current facility-administered medications on file prior to visit.

## 2019-07-13 NOTE — Telephone Encounter (Signed)
If she remains symptomatic she needs to be seen sooner urgent care or ER. If stable would have her come in next week [place] (primary cardiology) or worked into [facility] or [facility] [employer] schedule.

## 2019-07-13 NOTE — Telephone Encounter (Signed)
Discussed recomendations with patient husband. He said his wife is feeling much better now but if symptoms return he will call the office for an appointment.

## 2019-07-18 DIAGNOSIS — R339 Retention of urine, unspecified: Secondary | ICD-10-CM | POA: Diagnosis not present

## 2019-07-18 DIAGNOSIS — Z466 Encounter for fitting and adjustment of urinary device: Secondary | ICD-10-CM | POA: Diagnosis not present

## 2019-07-20 DIAGNOSIS — R339 Retention of urine, unspecified: Secondary | ICD-10-CM | POA: Diagnosis not present

## 2019-07-20 DIAGNOSIS — R351 Nocturia: Secondary | ICD-10-CM | POA: Diagnosis not present

## 2019-07-20 DIAGNOSIS — Z466 Encounter for fitting and adjustment of urinary device: Secondary | ICD-10-CM | POA: Diagnosis not present

## 2019-07-24 ENCOUNTER — Other Ambulatory Visit: Payer: Self-pay

## 2019-07-24 ENCOUNTER — Encounter: Payer: Self-pay | Admitting: Family Medicine

## 2019-07-24 ENCOUNTER — Ambulatory Visit (INDEPENDENT_AMBULATORY_CARE_PROVIDER_SITE_OTHER): Payer: Medicare PPO | Admitting: Family Medicine

## 2019-07-24 VITALS — BP 151/76 | HR 65 | Temp 97.5°F | Ht 65.0 in | Wt 159.0 lb

## 2019-07-24 DIAGNOSIS — Z9989 Dependence on other enabling machines and devices: Secondary | ICD-10-CM

## 2019-07-24 DIAGNOSIS — G629 Polyneuropathy, unspecified: Secondary | ICD-10-CM

## 2019-07-24 DIAGNOSIS — R413 Other amnesia: Secondary | ICD-10-CM | POA: Diagnosis not present

## 2019-07-24 DIAGNOSIS — G4733 Obstructive sleep apnea (adult) (pediatric): Secondary | ICD-10-CM

## 2019-07-24 NOTE — Progress Notes (Addendum)
PATIENT: Cynthia [name] DOB: 1940/09/24  REASON FOR VISIT: follow up HISTORY FROM: patient  Chief Complaint  Patient presents with  . Follow-up    memory, cpap, rm 1 with husband      HISTORY OF PRESENT ILLNESS: Today 07/24/19 Cynthia [name] is a 79 y.o. female here today for follow up for memory loss and neuropathy. She has resumed Alpha-lipoic acid. She does feel that this helps some. She is seeing a foot specialist as well for callouses. She is wearing specialty shoes as well. She continues duloxetine 30mg  twice daily. Memory is stable. MMSE 27/30 today. She can tell me year and month but unable to tell me what season it is.  Her husband presents with her today and aids in history.  He states that as a Clinical biochemist, he has seen a lot of dementia patients.  He does not feel that Melisse's symptoms are consistent with dementia.  He reports that short-term memory has always been a concern even during her college years.  She is able to perform all ADLs independently.  She continues CPAP for obstructive sleep apnea.  It seems as if she has been using her husband's CPAP machine for quite some time.  They report that there machines are identical.  Cynthia [name] is uncertain of her prescribed pressure settings.  Current CPAP report is actually listed in Mr. Borbon name, however, patient reports that this is the machine she has been using consistently.  Compliance report dated 06/24/2019 through 07/23/2019 reveals that she has used CPAP every night for compliance of 100%.  She has used CPAP greater than 4 hours 27 of the past 30 nights for compliance of 90%.  Average usage is 8 hours and 24 minutes.  Residual AHI was 10.6 on 6 cm of water and an EPR of 3.  There was no significant leak.  [name] reports that her machine is well over 60 years old.  I do not have any documentation of sleep study or prescribed settings.  She does not feel that she sleeps as well as she used to.  Epworth Sleepiness Scale score is 13  today.  HISTORY: (copied from my note on 11/30/2018)  Cynthia [name] is a 79 y.o. female here today for follow up of memory loss and neuropathy. She was taking Alpha-Lipoic acid for neuropathy but reports that she ran out and has not gotten more medication. She has noted worsening in tingling. She is also taking Cymbalta 30mg  daily. She does have concerns of continued memory loss.  She reports that memory waxes and wanes.  There are times that she has trouble with short-term memory and other times she cannot remember things from the past.  MMSE today 27 of 30.  She is able to do all ADLs.  She has been dealing with more stress recently.  She was diagnosed with afib about 6 months ago. She is taking diltiazem 120mg  daily. She reports that at least once daily she feels dizzy and lightheaded. She feels that diltiazem is causing her to feel this way so she has started taking this medication every other day. She continues to feel dizzy daily but symptoms are better on days she doesn't take diltiazem. She naps everyday between 11a and 2p. She is using CPAP nightly.  She feels totally exhausted despite naps and using CPAP regularly.  She is using a single-point cane due to concerns of dizziness and feeling off balance.  She denies falls with the exception of  tripping over a curb in the road.  She states that she did not pick her feet up with this transition.  There were no injuries with the exception of a bruise to the right ankle.  She did not hit her head.  She denies any difficulty breathing, shortness of breath, heart palpitations or chest pain.  She has follow-up with cardiology next week.   REVIEW OF SYSTEMS: Out of a complete 14 system review of symptoms, the patient complains only of the following symptoms, memory loss, neuropathy and all other reviewed systems are negative.  ALLERGIES: Allergies  Allergen Reactions  . Avelox [Moxifloxacin Hcl In Nacl] Other (See Comments)    unk  . Alendronate  Sodium Nausea Only    HOME MEDICATIONS: Outpatient Medications Prior to Visit  Medication Sig Dispense Refill  . acetaminophen (TYLENOL) 500 MG tablet Take 500 mg by mouth 2 (two) times daily.     . Alpha-Lipoic Acid (LIPOIC ACID PO) Take 2 tablets by mouth 2 (two) times daily.     Marland Kitchen atorvastatin (LIPITOR) 10 MG tablet TAKE 1 TABLET BY MOUTH EVERY DAY 90 tablet 3  . Calcium Carbonate-Vitamin D3 600-400 MG-UNIT TABS Take 1 tablet by mouth 2 (two) times a day.     . cholecalciferol (VITAMIN D) 1000 UNITS tablet Take 1,000 Units by mouth daily.    Marland Kitchen diltiazem (CARDIZEM CD) 120 MG 24 hr capsule TAKE 1 CAPSULE BY MOUTH DAILY *NEED OFFICE VISIT FOR REFILLS* 30 capsule 5  . diltiazem (TIAZAC) 120 MG 24 hr capsule Take by mouth.    . DULoxetine (CYMBALTA) 30 MG capsule TAKE 1 CAPSULE BY MOUTH TWICE A DAY (Patient taking differently: Take 30 mg by mouth 2 (two) times daily. ) 180 capsule 3  . ELIQUIS 5 MG TABS tablet TAKE 1 TABLET BY MOUTH TWICE A DAY 60 tablet 5  . fluticasone (FLONASE) 50 MCG/ACT nasal spray Place into the nose.    Marland Kitchen GLUCOSAMINE-CHONDROITIN PO Take 2 tablets by mouth daily with lunch. Glucosamine 1500mg  Chondroitin 1200mg     . ibuprofen (ADVIL) 200 MG tablet Take 200 mg by mouth every 6 (six) hours as needed.    . Lutein-Zeaxanthin 25-5 MG CAPS Take 1 tablet by mouth daily.    . Melatonin 3 MG TABS Take 3 mg by mouth at bedtime.    . Multiple Vitamin (MULTIVITAMIN WITH MINERALS) TABS tablet Take 1 tablet by mouth daily.    . nitroGLYCERIN (NITROLINGUAL) 0.4 MG/SPRAY spray Place 1 spray under the tongue every 5 (five) minutes x 3 doses as needed for chest pain.    . Omega-3 1000 MG CAPS Take 1,000 mg by mouth daily.    . pantoprazole (PROTONIX) 40 MG tablet Take 40 mg by mouth daily before breakfast.   1  . simethicone (MYLICON) 0000000 MG chewable tablet Chew by mouth.    . sucralfate (CARAFATE) 1 g tablet Take 1 g by mouth 3 (three) times daily.     Marland Kitchen thyroid (ARMOUR) 60 MG tablet  Take 60 mg by mouth daily before breakfast.    . tiZANidine (ZANAFLEX) 4 MG tablet Take 1 tablet (4 mg total) by mouth 3 (three) times daily as needed. 90 tablet 5  . traMADol (ULTRAM) 50 MG tablet Take 1 tablet by mouth every 6 (six) hours as needed.    . vitamin C (ASCORBIC ACID) 500 MG tablet Take 1,000 mg by mouth daily.     Marland Kitchen amoxicillin (AMOXIL) 500 MG capsule TAKE 1 CAPSULE BY MOUTH EVERY  12 HOURS FOR 7 DAYS     No facility-administered medications prior to visit.    PAST MEDICAL HISTORY: Past Medical History:  Diagnosis Date  . Acute blood loss anemia 10/27/2017  . Acute GI bleeding 10/27/2017  . Anemia   . Arthritis    "joints" (11/10/2017)  . Dizziness   . GERD (gastroesophageal reflux disease)   . GI bleed 11/06/2017  . Hearing loss   . History of blood transfusion 10/2017  . Hyperlipidemia   . Hyperthyroidism   . Melanoma ([place])    "cut off my back"  . Memory loss   . Sinus headache   . Sleep apnea     PAST SURGICAL HISTORY: Past Surgical History:  Procedure Laterality Date  . ABDOMINAL HYSTERECTOMY     "partial"  . APPENDECTOMY    . BIOPSY  11/17/2017   Procedure: BIOPSY;  Surgeon: Clarene Essex, MD;  Location: [facility];  Service: Endoscopy;;  . COLONOSCOPY WITH PROPOFOL N/A 11/17/2017   Procedure: COLONOSCOPY WITH PROPOFOL;  Surgeon: Clarene Essex, MD;  Location: [facility];  Service: Endoscopy;  Laterality: N/A;  . CRANIECTOMY FOR EXCISION OF ACOUSTIC NEUROMA    . ESOPHAGOGASTRODUODENOSCOPY (EGD) WITH PROPOFOL N/A 10/28/2017   Procedure: ESOPHAGOGASTRODUODENOSCOPY (EGD) WITH PROPOFOL;  Surgeon: Clarene Essex, MD;  Location: [facility];  Service: Endoscopy;  Laterality: N/A;  . KNEE ARTHROSCOPY Left   . LEFT HEART CATH AND CORONARY ANGIOGRAPHY N/A 11/24/2016   Procedure: LEFT HEART CATH AND CORONARY ANGIOGRAPHY;  Surgeon: Jettie Booze, MD;  Location: [facility];  Service: Cardiovascular;  Laterality: N/A;  . MELANOMA EXCISION     "off my back"  .  NASAL SINUS SURGERY    . TONSILLECTOMY      FAMILY HISTORY: Family History  Problem Relation Age of Onset  . Congestive Heart Failure Mother   . Heart disease Father        History of heart attacks at a later age.    Marland Kitchen Heart disease Brother   . Hearing loss Brother     SOCIAL HISTORY: Social History   Socioeconomic History  . Marital status: Married    Spouse name: Not on file  . Number of children: 2  . Years of education: Not on file  . Highest education level: Not on file  Occupational History  . Not on file  Tobacco Use  . Smoking status: Former Smoker    Years: 2.00    Types: Cigarettes    Quit date: 1980    Years since quitting: 41.3  . Smokeless tobacco: Never Used  . Tobacco comment: 11/10/2017 "only smoked when I was on my period"  Substance and Sexual Activity  . Alcohol use: Yes    Comment: 11/10/2017 "nothing lately; did have 1 glass of wine q night"  . Drug use: Never  . Sexual activity: Not Currently  Other Topics Concern  . Not on file  Social History Narrative   Right handed    Social Determinants of Health   Financial Resource Strain:   . Difficulty of Paying Living Expenses:   Food Insecurity:   . Worried About Charity fundraiser in the Last Year:   . Arboriculturist in the Last Year:   Transportation Needs:   . Film/video editor (Medical):   Marland Kitchen Lack of Transportation (Non-Medical):   Physical Activity:   . Days of Exercise per Week:   . Minutes of Exercise per Session:   Stress:   .  Feeling of Stress :   Social Connections:   . Frequency of Communication with Friends and Family:   . Frequency of Social Gatherings with Friends and Family:   . Attends Religious Services:   . Active Member of Clubs or Organizations:   . Attends Archivist Meetings:   Marland Kitchen Marital Status:   Intimate Partner Violence:   . Fear of Current or Ex-Partner:   . Emotionally Abused:   Marland Kitchen Physically Abused:   . Sexually Abused:       PHYSICAL  EXAM  Vitals:   07/24/19 0958  BP: (!) 151/76  Pulse: 65  Temp: (!) 97.5 F (36.4 C)  Weight: 159 lb (72.1 kg)  Height: 5\' 5"  (1.651 m)   Body mass index is 26.46 kg/m.  Generalized: Well developed, in no acute distress  Cardiology: normal rate and rhythm, no murmur noted Respiratory: clear to auscultation Mallampati: 4+, neck circ: 14.25" Neurological examination  Mentation: Alert oriented to time with exception of specific date and season, place, history taking. Follows all commands speech and language fluent Cranial nerve II-XII: Pupils were equal round reactive to light. Extraocular movements were full, visual field were full on confrontational test. Facial sensation and strength were normal. Uvula tongue midline. Head turning and shoulder shrug  were normal and symmetric. Motor: The motor testing reveals 5 over 5 strength of all 4 extremities. Good symmetric motor tone is noted throughout.  Sensory: Sensory testing is intact to soft touch on all 4 extremities. No evidence of extinction is noted.  Gait and station: Gait is normal.   DIAGNOSTIC DATA (LABS, IMAGING, TESTING) - I reviewed patient records, labs, notes, testing and imaging myself where available.  MMSE - Mini Mental State Exam 07/24/2019 11/29/2018  Orientation to time 2 3  Orientation to Place 5 5  Registration 3 3  Attention/ Calculation 5 5  Recall 3 3  Language- name 2 objects 2 2  Language- repeat 1 0  Language- follow 3 step command 3 3  Language- read & follow direction 1 1  Write a sentence 1 1  Copy design 1 1  Copy design-comments 16 animals named 12 animals  Total score 27 27     Lab Results  Component Value Date   WBC 11.0 (H) 06/30/2019   HGB 10.6 (L) 06/30/2019   HCT 33.7 (L) 06/30/2019   MCV 90.6 06/30/2019   PLT 233 06/30/2019      Component Value Date/Time   NA 136 06/30/2019 1740   K 3.2 (L) 06/30/2019 1740   CL 101 06/30/2019 1740   CO2 25 06/30/2019 1740   GLUCOSE 118 (H)  06/30/2019 1740   BUN 16 06/30/2019 1740   CREATININE 0.67 06/30/2019 1740   CALCIUM 8.9 06/30/2019 1740   PROT 5.6 (L) 12/14/2018 1910   PROT 6.7 10/30/2016 1104   ALBUMIN 3.3 (L) 12/14/2018 1910   AST 27 12/14/2018 1910   ALT 15 12/14/2018 1910   ALKPHOS 47 12/14/2018 1910   BILITOT 0.2 (L) 12/14/2018 1910   GFRNONAA >60 06/30/2019 1740   GFRAA >60 06/30/2019 1740   Lab Results  Component Value Date   CHOL 165 11/24/2016   HDL 61 11/24/2016   LDLCALC 86 11/24/2016   TRIG 88 11/24/2016   CHOLHDL 2.7 11/24/2016   Lab Results  Component Value Date   HGBA1C 5.6 11/24/2016   Lab Results  Component Value Date   VITAMINB12 >2000 (H) 10/30/2016   Lab Results  Component Value Date  TSH 0.855 12/14/2018       ASSESSMENT AND PLAN 79 y.o. year old female  has a past medical history of Acute blood loss anemia (10/27/2017), Acute GI bleeding (10/27/2017), Anemia, Arthritis, Dizziness, GERD (gastroesophageal reflux disease), GI bleed (11/06/2017), Hearing loss, History of blood transfusion (10/2017), Hyperlipidemia, Hyperthyroidism, Melanoma ([place]), Memory loss, Sinus headache, and Sleep apnea. here with     ICD-10-CM   1. Polyneuropathy  G62.9   2. Memory loss  R41.3 Nocturnal polysomnography  3. OSA on CPAP  G47.33 Nocturnal polysomnography   Z99.89     Mrs Nowlen is doing fairly well today. She continues duloxetine 30mg  twice daily and alpha-lipoic acid for neuropathy. We have discussed continuing current therapy as well as complementary therapies for pain management. Memory is stable.  MMSE is 27 of 30 today.  She has been using her husband's CPAP machine with a set pressure of 6 cm of water pressure.  I am unable to locate original sleep study with prescribed settings for Mrs. [place].  She reports that her machine is well over 30 years old.  Due to elevated AHI, I have ordered a new sleep study.  We will anticipate reordering CPAP pending these results with appropriate pressure  settings.  We will follow-up in 3 months, sooner if needed.  She verbalizes understanding and agreement with this plan.   Orders Placed This Encounter  Procedures  . Nocturnal polysomnography    Standing Status:   Future    Standing Expiration Date:   07/23/2020    Order Specific Question:   Where should this test be performed:    Answer:   [facility]     No orders of the defined types were placed in this encounter.     I spent 20 minutes with the patient. 50% of this time was spent counseling and educating patient on plan of care and medications.    Debbora Presto, FNP-C 07/24/2019, 10:39 AM [facility] [address], [place], [place] [address] [phone]

## 2019-07-24 NOTE — Progress Notes (Signed)
I have read the note, and I agree with the clinical assessment and plan.  Jozlyn Schatz A. Marrio Scribner, MD, PhD, FAAN Certified in Neurology, Clinical Neurophysiology, Sleep Medicine, Pain Medicine and Neuroimaging  [facility] [address], [address] [place], [place] [address] [phone]  

## 2019-07-24 NOTE — Patient Instructions (Addendum)
We will continue duloxetine 30mg  twice daily. Continue Alpha-lipoic acid as well if this is beneficial.   We will reorder a sleep study since it has been > 5 years and AHI is elevated on current machine.   Follow up in 3 months   Sleep Apnea Sleep apnea affects breathing during sleep. It causes breathing to stop for a short time or to become shallow. It can also increase the risk of:  Heart attack.  Stroke.  Being very overweight (obese).  Diabetes.  Heart failure.  Irregular heartbeat. The goal of treatment is to help you breathe normally again. What are the causes? There are three kinds of sleep apnea:  Obstructive sleep apnea. This is caused by a blocked or collapsed airway.  Central sleep apnea. This happens when the brain does not send the right signals to the muscles that control breathing.  Mixed sleep apnea. This is a combination of obstructive and central sleep apnea. The most common cause of this condition is a collapsed or blocked airway. This can happen if:  Your throat muscles are too relaxed.  Your tongue and tonsils are too large.  You are overweight.  Your airway is too small. What increases the risk?  Being overweight.  Smoking.  Having a small airway.  Being older.  Being female.  Drinking alcohol.  Taking medicines to calm yourself (sedatives or tranquilizers).  Having family members with the condition. What are the signs or symptoms?  Trouble staying asleep.  Being sleepy or tired during the day.  Getting angry a lot.  Loud snoring.  Headaches in the morning.  Not being able to focus your mind (concentrate).  Forgetting things.  Less interest in sex.  Mood swings.  Personality changes.  Feelings of sadness (depression).  Waking up a lot during the night to pee (urinate).  Dry mouth.  Sore throat. How is this diagnosed?  Your medical history.  A physical exam.  A test that is done when you are sleeping (sleep  study). The test is most often done in a sleep lab but may also be done at home. How is this treated?   Sleeping on your side.  Using a medicine to get rid of mucus in your nose (decongestant).  Avoiding the use of alcohol, medicines to help you relax, or certain pain medicines (narcotics).  Losing weight, if needed.  Changing your diet.  Not smoking.  Using a machine to open your airway while you sleep, such as: ? An oral appliance. This is a mouthpiece that shifts your lower jaw forward. ? A CPAP device. This device blows air through a mask when you breathe out (exhale). ? An EPAP device. This has valves that you put in each nostril. ? A BPAP device. This device blows air through a mask when you breathe in (inhale) and breathe out.  Having surgery if other treatments do not work. It is important to get treatment for sleep apnea. Without treatment, it can lead to:  High blood pressure.  Coronary artery disease.  In men, not being able to have an erection (impotence).  Reduced thinking ability. Follow these instructions at home: Lifestyle  Make changes that your doctor recommends.  Eat a healthy diet.  Lose weight if needed.  Avoid alcohol, medicines to help you relax, and some pain medicines.  Do not use any products that contain nicotine or tobacco, such as cigarettes, e-cigarettes, and chewing tobacco. If you need help quitting, ask your doctor. General instructions  Take  over-the-counter and prescription medicines only as told by your doctor.  If you were given a machine to use while you sleep, use it only as told by your doctor.  If you are having surgery, make sure to tell your doctor you have sleep apnea. You may need to bring your device with you.  Keep all follow-up visits as told by your doctor. This is important. Contact a doctor if:  The machine that you were given to use during sleep bothers you or does not seem to be working.  You do not get  better.  You get worse. Get help right away if:  Your chest hurts.  You have trouble breathing in enough air.  You have an uncomfortable feeling in your back, arms, or stomach.  You have trouble talking.  One side of your body feels weak.  A part of your face is hanging down. These symptoms may be an emergency. Do not wait to see if the symptoms will go away. Get medical help right away. Call your local emergency services (911 in the [place] Do not drive yourself to the hospital. Summary  This condition affects breathing during sleep.  The most common cause is a collapsed or blocked airway.  The goal of treatment is to help you breathe normally while you sleep. This information is not intended to replace advice given to you by your health care provider. Make sure you discuss any questions you have with your health care provider. Document Revised: 01/14/2018 Document Reviewed: 11/23/2017 Elsevier Patient Education  [place].

## 2019-07-26 ENCOUNTER — Other Ambulatory Visit: Payer: Self-pay | Admitting: *Deleted

## 2019-07-26 DIAGNOSIS — Z9989 Dependence on other enabling machines and devices: Secondary | ICD-10-CM

## 2019-07-26 DIAGNOSIS — G4733 Obstructive sleep apnea (adult) (pediatric): Secondary | ICD-10-CM

## 2019-07-27 DIAGNOSIS — R339 Retention of urine, unspecified: Secondary | ICD-10-CM | POA: Diagnosis not present

## 2019-08-02 DIAGNOSIS — R809 Proteinuria, unspecified: Secondary | ICD-10-CM | POA: Diagnosis not present

## 2019-08-02 DIAGNOSIS — Z87448 Personal history of other diseases of urinary system: Secondary | ICD-10-CM | POA: Diagnosis not present

## 2019-08-08 DIAGNOSIS — N390 Urinary tract infection, site not specified: Secondary | ICD-10-CM | POA: Diagnosis not present

## 2019-08-16 DIAGNOSIS — M25561 Pain in right knee: Secondary | ICD-10-CM | POA: Diagnosis not present

## 2019-08-16 DIAGNOSIS — R5383 Other fatigue: Secondary | ICD-10-CM | POA: Diagnosis not present

## 2019-08-16 DIAGNOSIS — F419 Anxiety disorder, unspecified: Secondary | ICD-10-CM | POA: Diagnosis not present

## 2019-08-17 DIAGNOSIS — R399 Unspecified symptoms and signs involving the genitourinary system: Secondary | ICD-10-CM | POA: Diagnosis not present

## 2019-08-17 DIAGNOSIS — M25462 Effusion, left knee: Secondary | ICD-10-CM | POA: Diagnosis not present

## 2019-08-17 DIAGNOSIS — M1990 Unspecified osteoarthritis, unspecified site: Secondary | ICD-10-CM | POA: Diagnosis not present

## 2019-08-17 DIAGNOSIS — M25561 Pain in right knee: Secondary | ICD-10-CM | POA: Diagnosis not present

## 2019-08-17 DIAGNOSIS — M1712 Unilateral primary osteoarthritis, left knee: Secondary | ICD-10-CM | POA: Diagnosis not present

## 2019-08-17 DIAGNOSIS — M1711 Unilateral primary osteoarthritis, right knee: Secondary | ICD-10-CM | POA: Diagnosis not present

## 2019-08-17 DIAGNOSIS — M25461 Effusion, right knee: Secondary | ICD-10-CM | POA: Diagnosis not present

## 2019-08-17 DIAGNOSIS — M25562 Pain in left knee: Secondary | ICD-10-CM | POA: Diagnosis not present

## 2019-08-23 DIAGNOSIS — R339 Retention of urine, unspecified: Secondary | ICD-10-CM | POA: Diagnosis not present

## 2019-08-24 ENCOUNTER — Ambulatory Visit (INDEPENDENT_AMBULATORY_CARE_PROVIDER_SITE_OTHER): Payer: Medicare PPO | Admitting: Podiatry

## 2019-08-24 ENCOUNTER — Other Ambulatory Visit: Payer: Self-pay

## 2019-08-24 ENCOUNTER — Encounter: Payer: Self-pay | Admitting: Podiatry

## 2019-08-24 ENCOUNTER — Telehealth: Payer: Self-pay | Admitting: *Deleted

## 2019-08-24 DIAGNOSIS — R2681 Unsteadiness on feet: Secondary | ICD-10-CM

## 2019-08-24 DIAGNOSIS — M79675 Pain in left toe(s): Secondary | ICD-10-CM | POA: Diagnosis not present

## 2019-08-24 DIAGNOSIS — M2141 Flat foot [pes planus] (acquired), right foot: Secondary | ICD-10-CM

## 2019-08-24 DIAGNOSIS — R269 Unspecified abnormalities of gait and mobility: Secondary | ICD-10-CM

## 2019-08-24 DIAGNOSIS — M79674 Pain in right toe(s): Secondary | ICD-10-CM

## 2019-08-24 DIAGNOSIS — B351 Tinea unguium: Secondary | ICD-10-CM | POA: Diagnosis not present

## 2019-08-24 DIAGNOSIS — Z7901 Long term (current) use of anticoagulants: Secondary | ICD-10-CM | POA: Diagnosis not present

## 2019-08-24 DIAGNOSIS — L84 Corns and callosities: Secondary | ICD-10-CM | POA: Diagnosis not present

## 2019-08-24 DIAGNOSIS — M2142 Flat foot [pes planus] (acquired), left foot: Secondary | ICD-10-CM

## 2019-08-24 NOTE — Telephone Encounter (Signed)
Required form, and demographics faxed to [facility].

## 2019-08-24 NOTE — Telephone Encounter (Signed)
-----   Message from Trula Slade, DPM sent at 08/24/2019  9:34 AM EDT ----- Can you please put in a referral to see [facility] for gait training and general strengthening of the lower extremities? Thanks.

## 2019-08-25 NOTE — Progress Notes (Signed)
Subjective: 79 year old female presents the office today for concerns of recurrent painful calluses to both of her feet with the left side worse than the right as well as for thick, discolored toenails that she cannot trim her self.  She denies any redness or drainage or any swelling to me from the area.  Also she states that she has been having some gait issues and that she stands up straight she gets some pain to her feet.  She denies any recent injury or falls and no weakness.  She describes some cramping sensations to her left foot last night which is new.  No cramping in the leg or swelling.  She is on Eliquis   Objective: AAO x3, NAD DP/PT pulses palpable bilaterally, CRT less than 3 seconds Tenderness present on the plantar medial aspect of foot is a hyperkeratotic lesion bilaterally left side worse than the right.  This is under the area of bony prominence given her significant flatfoot deformity.  There is no underlying ulceration, drainage or any signs of infection. Nails are hypertrophic, dystrophic, brittle, discolored, elongated 10. No surrounding redness or drainage. Tenderness nails 1-5 bilaterally. No open lesions or pre-ulcerative lesions are identified today. Significant flatfoot is evident.  There is no area of pinpoint tenderness identified today. No pain with calf compression, swelling, warmth, erythema  Assessment: 79year-old female with symptomatic hyperkeratotic lesions; symptomatic onychomycosis  Plan: -All treatment options discussed with the patient including all alternatives, risks, complications.  -Lesions were sharply debrided x2 without any complications or bleeding.  Continue offloading at all times and continue with moisturizer to the area daily.   -Nails debrided x10 without any complications or bleeding -Will refer to [facility] physical therapy for gait training, general lower extremity strengthening -Continue supportive shoes and orthotics.  The flatfoot  is causing the preulcerative calluses. -Patient encouraged to call the office with any questions, concerns, change in symptoms.   Trula Slade DPM

## 2019-08-28 ENCOUNTER — Ambulatory Visit (INDEPENDENT_AMBULATORY_CARE_PROVIDER_SITE_OTHER): Payer: Medicare PPO | Admitting: Neurology

## 2019-08-28 DIAGNOSIS — G4733 Obstructive sleep apnea (adult) (pediatric): Secondary | ICD-10-CM

## 2019-08-28 DIAGNOSIS — G4761 Periodic limb movement disorder: Secondary | ICD-10-CM

## 2019-08-28 DIAGNOSIS — Z9989 Dependence on other enabling machines and devices: Secondary | ICD-10-CM

## 2019-08-30 DIAGNOSIS — N390 Urinary tract infection, site not specified: Secondary | ICD-10-CM | POA: Diagnosis not present

## 2019-08-30 DIAGNOSIS — G4761 Periodic limb movement disorder: Secondary | ICD-10-CM | POA: Insufficient documentation

## 2019-08-30 NOTE — Progress Notes (Signed)
PATIENT'S NAME:  Cynthia [name], Cynthia [name] DOB:      1941/04/04      MR#:    [id number]     DATE OF RECORDING: 08/28/2019 REFERRING M.D.:  Deland Pretty MD Study Performed:  Split-Night Titration Study HISTORY:   79 year old woman with OSA, currently on CPAP +6 cm with residual AHI = 10.2.    The patient endorsed the Epworth Sleepiness Scale at 13/24 points    The patient's weight 159 pounds with a height of 65 (inches), resulting in a BMI of 26.4 kg/m2.  The patient's neck circumference measured 14.2 inches.  CURRENT MEDICATIONS: Tylenol, Lipitor, Vitamin D3, Cardizem, Tiazac, Cymbalta, Eliquis, Flonase, Advil, Melatonin, Multivitamin, Omega-3, Protonix, Carafate, Armour, Zanaflex, Ultram, Vitamin C, Amoxil.    PROCEDURE:  This is a multichannel digital polysomnogram utilizing the Somnostar 11.2 system.  Electrodes and sensors were applied and monitored per AASM Specifications.   EEG, EOG, Chin and Limb EMG, were sampled at 200 Hz.  ECG, Snore and Nasal Pressure, Thermal Airflow, Respiratory Effort, CPAP Flow and Pressure, Oximetry was sampled at 50 Hz. Digital video and audio were recorded.      BASELINE STUDY WITHOUT CPAP RESULTS:  Lights Out was at 21:37 and Lights On at 04:46.  Total recording time (TRT) was 186, with a total sleep time (TST) of 125.5 minutes.   The patient's sleep latency was 24 minutes.  REM latency was 0 minutes.  The sleep efficiency was 67.5 %.    SLEEP ARCHITECTURE: WASO (Wake after sleep onset) was 38 minutes, Stage N1 was 6 minutes, Stage N2 was 103.5 minutes, Stage N3 was 16 minutes and Stage R (REM sleep) was 0 minutes.  The percentages were Stage N1 4.8%, Stage N2 82.5%, Stage N3 12.7% and Stage R (REM sleep) 0%.  The arousals were noted as: 22 were spontaneous, 80 were associated with PLMs, 111 were associated with respiratory events.   RESPIRATORY ANALYSIS:  There were a total of 142 respiratory events:  17 obstructive apneas, 15 central apneas and 0 mixed apneas with  a total of 32 apneas and an apnea index (AI) of 15.3. There were 110 hypopneas with a hypopnea index of 52.6. The patient also had 0 respiratory event related arousals (RERAs).  Snoring was noted.     The total APNEA/HYPOPNEA INDEX (AHI) was 67.9 /hour and the total RESPIRATORY DISTURBANCE INDEX was 67.9 /hour.  0 events occurred in REM sleep and 235 events in NREM. The REM AHI was 0, /hour versus a non-REM AHI of 67.9 /hour. The patient spent 54.5 minutes sleep time in the supine position 202 minutes in non-supine. The supine AHI was 56.3 /hour versus a non-supine AHI of 70.7 /hour.  OXYGEN SATURATION & C02:  The wake baseline 02 saturation was 94%, with the lowest being 84%. Time spent below 89% saturation equaled 47 minutes.  PERIODIC LIMB MOVEMENTS: The patient had a total of 112 Periodic Limb Movements.  The Periodic Limb Movement (PLM) index was 53.5 /hour and the PLM Arousal index was 38.2 /hour.     Audio and video analysis did not show any abnormal or unusual movements, behaviors, phonations or vocalizations  The patient took multiple bathroom breaks   EKG was in keeping with normal sinus rhythm (NSR)     TITRATION STUDY WITH CPAP RESULTS:   CPAP was initiated at 0/0/0/0 cmH20 with heated humidity per AASM split night standards and pressure was advanced to 0/0 cmH20 because of hypopneas, apneas and desaturations.  At  a PAP pressure of 0 cmH20, there was a reduction of the AHI to 67.9 /hour.   Total recording time (TRT) was 243 minutes, with a total sleep time (TST) of 130.5 minutes. The patient's sleep latency was 49.5 minutes. REM latency was 0 minutes.  The sleep efficiency was 53.7 %.    SLEEP ARCHITECTURE: Wake after sleep was 64 minutes, Stage N1 6 minutes, Stage N2 81.5 minutes, Stage N3 43 minutes and Stage R (REM sleep) 0 minutes. The percentages were: Stage N1 4.6%, Stage N2 62.5%, Stage N3 33.% and Stage R (REM sleep) 0%.   The arousals were noted as: 6 were spontaneous,  49 were associated with PLMs, 23 were associated with respiratory events.  RESPIRATORY ANALYSIS:  There were a total of 35 respiratory events: 9 obstructive apneas, 19 central apneas and 0 mixed apneas with a total of 28 apneas and an apnea index (AI) of 12.9. There were 7 hypopneas with a hypopnea index of 3.2 /hour. The patient also had 0 respiratory event related arousals (RERAs).      The total APNEA/HYPOPNEA INDEX  (AHI) was 16.1 /hour and the total RESPIRATORY DISTURBANCE INDEX was 16.1 /hour.  0 events occurred in REM sleep and 35 events in NREM. The non-REM AHI of 16.1 /hour. REM sleep was not achieved The patient spent 23% of total sleep time in the supine position. The supine AHI was 48.0 /hour, versus a non-supine AHI of 6.6/hour.  OXYGEN SATURATION & C02:  The wake baseline 02 saturation was 94%, with the lowest being 86%. Time spent below 89% saturation equaled 1 minutes.  PERIODIC LIMB MOVEMENTS:    The patient had a total of 213 Periodic Limb Movements. The Periodic Limb Movement (PLM) index was 97.9 /hour and the PLM Arousal index was 22.5 /hour.    POLYSOMNOGRAPHY IMPRESSION :   1. Severe obstructive sleep apnea with a pre-CPAP AHI = 67.9.    2. Successful CPAP titration to +10 cm H2O.  On this setting the AHI was 0.0 3. Severe PLMS with an index of 97.9 (on CPAP) and Arousal index of 22.5/hr   RECOMMENDATIONS:  1.  CPAP +10 cm H2O with a heated humidifier.   Check download in 30/90 days 2.  Consider treatment of the PLMS with a dopamine agonist or gabapentin.   3.  Follow up with Dr. Felecia Shelling    I certify that I have reviewed the entire raw data recording prior to the issuance of this report in accordance with the Standards of Accreditation of the American Academy of Sleep Medicine (AASM)    Shravan Salahuddin,MD,PhD [facility], Sleep Medicine

## 2019-08-31 ENCOUNTER — Other Ambulatory Visit: Payer: Self-pay | Admitting: *Deleted

## 2019-08-31 DIAGNOSIS — G4733 Obstructive sleep apnea (adult) (pediatric): Secondary | ICD-10-CM

## 2019-08-31 NOTE — Progress Notes (Signed)
Called and spoke with husband, Ilona Sorrel. He placed phone on speaker and I spoke with both him and Marta. I relayed results per Dr. Felecia Shelling note for her sleep study: Severe OSA with a pre-cpap AHI+67.9. Both obstructive and central sleep apneas  She has a current machine that is >77 yr old. We will send new orders to [facility] requesting new machine and to set her up with CPAP +10cm H20 with a heated humidifier. Faxed to [facility] at [phone]. Received fax confirmation.   Scheduled f/u with Dr. Felecia Shelling for 12/13/19 at 3pm. Advised them to bring machine with her to appt. Asked they call back if they do not hear from [facility] with the next week or so. They verbalized understanding.

## 2019-09-01 DIAGNOSIS — Z7902 Long term (current) use of antithrombotics/antiplatelets: Secondary | ICD-10-CM | POA: Diagnosis not present

## 2019-09-01 DIAGNOSIS — Z7901 Long term (current) use of anticoagulants: Secondary | ICD-10-CM | POA: Diagnosis not present

## 2019-09-01 DIAGNOSIS — E7800 Pure hypercholesterolemia, unspecified: Secondary | ICD-10-CM | POA: Diagnosis not present

## 2019-09-07 DIAGNOSIS — E7800 Pure hypercholesterolemia, unspecified: Secondary | ICD-10-CM | POA: Diagnosis not present

## 2019-09-07 DIAGNOSIS — D508 Other iron deficiency anemias: Secondary | ICD-10-CM | POA: Diagnosis not present

## 2019-09-07 DIAGNOSIS — N390 Urinary tract infection, site not specified: Secondary | ICD-10-CM | POA: Diagnosis not present

## 2019-09-07 DIAGNOSIS — I10 Essential (primary) hypertension: Secondary | ICD-10-CM | POA: Diagnosis not present

## 2019-09-19 DIAGNOSIS — D508 Other iron deficiency anemias: Secondary | ICD-10-CM | POA: Diagnosis not present

## 2019-09-19 DIAGNOSIS — Z7901 Long term (current) use of anticoagulants: Secondary | ICD-10-CM | POA: Diagnosis not present

## 2019-09-28 DIAGNOSIS — R2689 Other abnormalities of gait and mobility: Secondary | ICD-10-CM | POA: Diagnosis not present

## 2019-09-29 ENCOUNTER — Ambulatory Visit (INDEPENDENT_AMBULATORY_CARE_PROVIDER_SITE_OTHER): Payer: Medicare PPO | Admitting: Podiatry

## 2019-09-29 ENCOUNTER — Other Ambulatory Visit: Payer: Self-pay

## 2019-09-29 DIAGNOSIS — L989 Disorder of the skin and subcutaneous tissue, unspecified: Secondary | ICD-10-CM

## 2019-09-29 DIAGNOSIS — M778 Other enthesopathies, not elsewhere classified: Secondary | ICD-10-CM | POA: Diagnosis not present

## 2019-10-03 ENCOUNTER — Encounter: Payer: Self-pay | Admitting: Podiatry

## 2019-10-03 DIAGNOSIS — R2689 Other abnormalities of gait and mobility: Secondary | ICD-10-CM | POA: Diagnosis not present

## 2019-10-03 DIAGNOSIS — R399 Unspecified symptoms and signs involving the genitourinary system: Secondary | ICD-10-CM | POA: Diagnosis not present

## 2019-10-03 NOTE — Progress Notes (Signed)
Subjective:  [name], female    DOB: Apr 24, 1940,  MRN: [id number]  Chief Complaint  Patient presents with  . Callouses    pt is here for a callous trim of both feet, pt states that the foot pain is elevated to the touch    79 y.o. female presents with the above complaint.  Patient presents with bilateral midfoot hyperkeratotic lesion with underlying inflammatory capsulitis.  Patient states that they have been painful when ambulating.  Patient states that they have been going for quite some time.  She would like to know if there is anything else that could be done beside offloading.  Patient known to Dr. Earleen [name].  They have tried debridement which does tend to help a little bit however it comes right back.  She denies any other acute complaints.  Patient not a diabetic.   Review of Systems: Negative except as noted in the HPI. Denies N/V/F/Ch.  Past Medical History:  Diagnosis Date  . Acute blood loss anemia 10/27/2017  . Acute GI bleeding 10/27/2017  . Anemia   . Arthritis    "joints" (11/10/2017)  . Dizziness   . GERD (gastroesophageal reflux disease)   . GI bleed 11/06/2017  . Hearing loss   . History of blood transfusion 10/2017  . Hyperlipidemia   . Hyperthyroidism   . Melanoma ([place])    "cut off my back"  . Memory loss   . Sinus headache   . Sleep apnea     Current Outpatient Medications:  .  acetaminophen (TYLENOL) 500 MG tablet, Take 500 mg by mouth 2 (two) times daily. , Disp: , Rfl:  .  Alpha-Lipoic Acid (LIPOIC ACID PO), Take 2 tablets by mouth 2 (two) times daily. , Disp: , Rfl:  .  atorvastatin (LIPITOR) 10 MG tablet, TAKE 1 TABLET BY MOUTH EVERY DAY, Disp: 90 tablet, Rfl: 3 .  Calcium Carbonate-Vitamin D3 600-400 MG-UNIT TABS, Take 1 tablet by mouth 2 (two) times a day. , Disp: , Rfl:  .  cholecalciferol (VITAMIN D) 1000 UNITS tablet, Take 1,000 Units by mouth daily., Disp: , Rfl:  .  diltiazem (CARDIZEM CD) 120 MG 24 hr capsule, TAKE 1 CAPSULE BY  MOUTH DAILY *NEED OFFICE VISIT FOR REFILLS*, Disp: 30 capsule, Rfl: 5 .  diltiazem (TIAZAC) 120 MG 24 hr capsule, Take by mouth., Disp: , Rfl:  .  DULoxetine (CYMBALTA) 30 MG capsule, TAKE 1 CAPSULE BY MOUTH TWICE A DAY (Patient taking differently: Take 30 mg by mouth 2 (two) times daily. ), Disp: 180 capsule, Rfl: 3 .  ELIQUIS 5 MG TABS tablet, TAKE 1 TABLET BY MOUTH TWICE A DAY, Disp: 60 tablet, Rfl: 5 .  fluticasone (FLONASE) 50 MCG/ACT nasal spray, Place into the nose., Disp: , Rfl:  .  GLUCOSAMINE-CHONDROITIN PO, Take 2 tablets by mouth daily with lunch. Glucosamine 1500mg  Chondroitin 1200mg , Disp: , Rfl:  .  ibuprofen (ADVIL) 200 MG tablet, Take 200 mg by mouth every 6 (six) hours as needed., Disp: , Rfl:  .  Lutein-Zeaxanthin 25-5 MG CAPS, Take 1 tablet by mouth daily., Disp: , Rfl:  .  Melatonin 3 MG TABS, Take 3 mg by mouth at bedtime., Disp: , Rfl:  .  Multiple Vitamin (MULTIVITAMIN WITH MINERALS) TABS tablet, Take 1 tablet by mouth daily., Disp: , Rfl:  .  nitroGLYCERIN (NITROLINGUAL) 0.4 MG/SPRAY spray, Place 1 spray under the tongue every 5 (five) minutes x 3 doses as needed for chest pain., Disp: , Rfl:  .  Omega-3 1000 MG CAPS, Take 1,000 mg by mouth daily., Disp: , Rfl:  .  pantoprazole (PROTONIX) 40 MG tablet, Take 40 mg by mouth daily before breakfast. , Disp: , Rfl: 1 .  simethicone (MYLICON) 062 MG chewable tablet, Chew by mouth., Disp: , Rfl:  .  sucralfate (CARAFATE) 1 g tablet, Take 1 g by mouth 3 (three) times daily. , Disp: , Rfl:  .  thyroid (ARMOUR) 60 MG tablet, Take 60 mg by mouth daily before breakfast., Disp: , Rfl:  .  tiZANidine (ZANAFLEX) 4 MG tablet, Take 1 tablet (4 mg total) by mouth 3 (three) times daily as needed., Disp: 90 tablet, Rfl: 5 .  traMADol (ULTRAM) 50 MG tablet, Take 1 tablet by mouth every 6 (six) hours as needed., Disp: , Rfl:  .  vitamin C (ASCORBIC ACID) 500 MG tablet, Take 1,000 mg by mouth daily. , Disp: , Rfl:   Social History   Tobacco  Use  Smoking Status Former Smoker  . Years: 2.00  . Types: Cigarettes  . Quit date: 31  . Years since quitting: 41.5  Smokeless Tobacco Never Used  Tobacco Comment   11/10/2017 "only smoked when I was on my period"    Allergies  Allergen Reactions  . Avelox [Moxifloxacin Hcl In Nacl] Other (See Comments)    unk  . Alendronate Sodium Nausea Only   Objective:  There were no vitals filed for this visit. There is no height or weight on file to calculate BMI. Constitutional Well developed. Well nourished.  Vascular Dorsalis pedis pulses palpable bilaterally. Posterior tibial pulses palpable bilaterally. Capillary refill normal to all digits.  No cyanosis or clubbing noted. Pedal hair growth normal.  Neurologic Normal speech. Oriented to person, place, and time. Epicritic sensation to light touch grossly present bilaterally.  Dermatologic Nails well groomed and normal in appearance. No open wounds. No skin lesions.  Orthopedic:  Pain on palpation to the bilateral midfoot hyperkeratotic lesion.  The lesion appeared to be due to underlying pes planus deformity with likely fat pad atrophy leading to aggressive pressure.  No open wounds or ulcers noted.  No clinical signs of infection noted.   Radiographs: None Assessment:   1. Benign skin lesion   2. Capsulitis of right foot   3. Capsulitis of left foot    Plan:  Patient was evaluated and treated and all questions answered.  Bilateral midfoot benign skin lesion with underlying capsulitis -I explained to the patient the etiology of hyperkeratotic/benign skin lesion various treatment options were extensively discussed.  Given that this lesion has been present for a long period of time there is likely an acute inflammatory component of pain associated with it.  I believe patient will benefit from a steroid injection as well as aggressive debridement.  Patient agrees with the plan would like to proceed with a steroid injection. -A  steroid injection was performed at bilateral midfoot using 1% plain Lidocaine and 10 mg of Kenalog. This was well tolerated. -Using chisel blade and a handle, the hyperkeratotic lesion was aggressively help debrided down to healthy striated tissue.  No ulcer noted.  No pinpoint bleeding noted.   No follow-ups on file.

## 2019-10-05 DIAGNOSIS — R2689 Other abnormalities of gait and mobility: Secondary | ICD-10-CM | POA: Diagnosis not present

## 2019-10-10 DIAGNOSIS — M25561 Pain in right knee: Secondary | ICD-10-CM | POA: Diagnosis not present

## 2019-10-12 DIAGNOSIS — R2689 Other abnormalities of gait and mobility: Secondary | ICD-10-CM | POA: Diagnosis not present

## 2019-10-13 DIAGNOSIS — Z7901 Long term (current) use of anticoagulants: Secondary | ICD-10-CM | POA: Diagnosis not present

## 2019-10-13 DIAGNOSIS — Z130 Encounter for screening for diseases of the blood and blood-forming organs and certain disorders involving the immune mechanism: Secondary | ICD-10-CM | POA: Diagnosis not present

## 2019-10-13 DIAGNOSIS — M8580 Other specified disorders of bone density and structure, unspecified site: Secondary | ICD-10-CM | POA: Diagnosis not present

## 2019-10-13 DIAGNOSIS — E7800 Pure hypercholesterolemia, unspecified: Secondary | ICD-10-CM | POA: Diagnosis not present

## 2019-10-13 DIAGNOSIS — D649 Anemia, unspecified: Secondary | ICD-10-CM | POA: Diagnosis not present

## 2019-10-13 DIAGNOSIS — E559 Vitamin D deficiency, unspecified: Secondary | ICD-10-CM | POA: Diagnosis not present

## 2019-10-13 DIAGNOSIS — M810 Age-related osteoporosis without current pathological fracture: Secondary | ICD-10-CM | POA: Diagnosis not present

## 2019-10-13 DIAGNOSIS — I10 Essential (primary) hypertension: Secondary | ICD-10-CM | POA: Diagnosis not present

## 2019-10-13 DIAGNOSIS — E039 Hypothyroidism, unspecified: Secondary | ICD-10-CM | POA: Diagnosis not present

## 2019-10-13 DIAGNOSIS — R7303 Prediabetes: Secondary | ICD-10-CM | POA: Diagnosis not present

## 2019-10-18 DIAGNOSIS — R399 Unspecified symptoms and signs involving the genitourinary system: Secondary | ICD-10-CM | POA: Diagnosis not present

## 2019-10-19 DIAGNOSIS — R2689 Other abnormalities of gait and mobility: Secondary | ICD-10-CM | POA: Diagnosis not present

## 2019-10-23 DIAGNOSIS — Z1212 Encounter for screening for malignant neoplasm of rectum: Secondary | ICD-10-CM | POA: Diagnosis not present

## 2019-10-24 DIAGNOSIS — R2689 Other abnormalities of gait and mobility: Secondary | ICD-10-CM | POA: Diagnosis not present

## 2019-10-26 DIAGNOSIS — R2689 Other abnormalities of gait and mobility: Secondary | ICD-10-CM | POA: Diagnosis not present

## 2019-10-27 DIAGNOSIS — K2100 Gastro-esophageal reflux disease with esophagitis, without bleeding: Secondary | ICD-10-CM | POA: Diagnosis not present

## 2019-10-27 DIAGNOSIS — D500 Iron deficiency anemia secondary to blood loss (chronic): Secondary | ICD-10-CM | POA: Diagnosis not present

## 2019-10-30 ENCOUNTER — Encounter: Payer: Self-pay | Admitting: Cardiology

## 2019-10-31 DIAGNOSIS — D509 Iron deficiency anemia, unspecified: Secondary | ICD-10-CM | POA: Diagnosis not present

## 2019-10-31 DIAGNOSIS — K3189 Other diseases of stomach and duodenum: Secondary | ICD-10-CM | POA: Diagnosis not present

## 2019-10-31 DIAGNOSIS — K449 Diaphragmatic hernia without obstruction or gangrene: Secondary | ICD-10-CM | POA: Diagnosis not present

## 2019-10-31 DIAGNOSIS — K317 Polyp of stomach and duodenum: Secondary | ICD-10-CM | POA: Diagnosis not present

## 2019-11-01 DIAGNOSIS — M25561 Pain in right knee: Secondary | ICD-10-CM | POA: Diagnosis not present

## 2019-11-03 ENCOUNTER — Encounter: Payer: Self-pay | Admitting: Podiatry

## 2019-11-03 ENCOUNTER — Other Ambulatory Visit: Payer: Self-pay

## 2019-11-03 ENCOUNTER — Ambulatory Visit (INDEPENDENT_AMBULATORY_CARE_PROVIDER_SITE_OTHER): Payer: Medicare PPO | Admitting: Podiatry

## 2019-11-03 DIAGNOSIS — Q666 Other congenital valgus deformities of feet: Secondary | ICD-10-CM | POA: Diagnosis not present

## 2019-11-03 DIAGNOSIS — L84 Corns and callosities: Secondary | ICD-10-CM | POA: Diagnosis not present

## 2019-11-03 DIAGNOSIS — M25561 Pain in right knee: Secondary | ICD-10-CM | POA: Diagnosis not present

## 2019-11-03 DIAGNOSIS — L989 Disorder of the skin and subcutaneous tissue, unspecified: Secondary | ICD-10-CM

## 2019-11-03 DIAGNOSIS — R2689 Other abnormalities of gait and mobility: Secondary | ICD-10-CM | POA: Diagnosis not present

## 2019-11-03 NOTE — Progress Notes (Signed)
Subjective:  [name], female    DOB: July 21, 1940,  MRN: [id number]  Chief Complaint  Patient presents with  . Wound Check    pt is doing well, wants to get checked out- tender- slight reddness- overall doing well     79 y.o. female presents with the above complaint.  Patient presents with bilateral midfoot hyperkeratotic lesion follow-up.  Patient states she is doing a little bit better.  The injection did help a lot.  She states that there is still excessive pressure present.  She does not have any open wounds.  They have not been doing anything else for it.  She denies any other acute complaints.   Review of Systems: Negative except as noted in the HPI. Denies N/V/F/Ch.  Past Medical History:  Diagnosis Date  . Acute blood loss anemia 10/27/2017  . Acute GI bleeding 10/27/2017  . Anemia   . Arthritis    "joints" (11/10/2017)  . Dizziness   . GERD (gastroesophageal reflux disease)   . GI bleed 11/06/2017  . Hearing loss   . History of blood transfusion 10/2017  . Hyperlipidemia   . Hyperthyroidism   . Melanoma ([place])    "cut off my back"  . Memory loss   . Sinus headache   . Sleep apnea     Current Outpatient Medications:  .  acetaminophen (TYLENOL) 500 MG tablet, Take 500 mg by mouth 2 (two) times daily. , Disp: , Rfl:  .  Alpha-Lipoic Acid (LIPOIC ACID PO), Take 2 tablets by mouth 2 (two) times daily. , Disp: , Rfl:  .  atorvastatin (LIPITOR) 10 MG tablet, TAKE 1 TABLET BY MOUTH EVERY DAY, Disp: 90 tablet, Rfl: 3 .  Calcium Carbonate-Vitamin D3 600-400 MG-UNIT TABS, Take 1 tablet by mouth 2 (two) times a day. , Disp: , Rfl:  .  cholecalciferol (VITAMIN D) 1000 UNITS tablet, Take 1,000 Units by mouth daily., Disp: , Rfl:  .  diltiazem (CARDIZEM CD) 120 MG 24 hr capsule, TAKE 1 CAPSULE BY MOUTH DAILY *NEED OFFICE VISIT FOR REFILLS*, Disp: 30 capsule, Rfl: 5 .  diltiazem (TIAZAC) 120 MG 24 hr capsule, Take by mouth., Disp: , Rfl:  .  DULoxetine (CYMBALTA) 30  MG capsule, TAKE 1 CAPSULE BY MOUTH TWICE A DAY (Patient taking differently: Take 30 mg by mouth 2 (two) times daily. ), Disp: 180 capsule, Rfl: 3 .  ELIQUIS 5 MG TABS tablet, TAKE 1 TABLET BY MOUTH TWICE A DAY, Disp: 60 tablet, Rfl: 5 .  fluticasone (FLONASE) 50 MCG/ACT nasal spray, Place into the nose., Disp: , Rfl:  .  GLUCOSAMINE-CHONDROITIN PO, Take 2 tablets by mouth daily with lunch. Glucosamine 1500mg  Chondroitin 1200mg , Disp: , Rfl:  .  ibuprofen (ADVIL) 200 MG tablet, Take 200 mg by mouth every 6 (six) hours as needed., Disp: , Rfl:  .  Lutein-Zeaxanthin 25-5 MG CAPS, Take 1 tablet by mouth daily., Disp: , Rfl:  .  Melatonin 3 MG TABS, Take 3 mg by mouth at bedtime., Disp: , Rfl:  .  Multiple Vitamin (MULTIVITAMIN WITH MINERALS) TABS tablet, Take 1 tablet by mouth daily., Disp: , Rfl:  .  nitroGLYCERIN (NITROLINGUAL) 0.4 MG/SPRAY spray, Place 1 spray under the tongue every 5 (five) minutes x 3 doses as needed for chest pain., Disp: , Rfl:  .  Omega-3 1000 MG CAPS, Take 1,000 mg by mouth daily., Disp: , Rfl:  .  pantoprazole (PROTONIX) 40 MG tablet, Take 40 mg by mouth daily before  breakfast. , Disp: , Rfl: 1 .  simethicone (MYLICON) 973 MG chewable tablet, Chew by mouth., Disp: , Rfl:  .  sucralfate (CARAFATE) 1 g tablet, Take 1 g by mouth 3 (three) times daily. , Disp: , Rfl:  .  thyroid (ARMOUR) 60 MG tablet, Take 60 mg by mouth daily before breakfast., Disp: , Rfl:  .  tiZANidine (ZANAFLEX) 4 MG tablet, Take 1 tablet (4 mg total) by mouth 3 (three) times daily as needed., Disp: 90 tablet, Rfl: 5 .  traMADol (ULTRAM) 50 MG tablet, Take 1 tablet by mouth every 6 (six) hours as needed., Disp: , Rfl:  .  vitamin C (ASCORBIC ACID) 500 MG tablet, Take 1,000 mg by mouth daily. , Disp: , Rfl:   Social History   Tobacco Use  Smoking Status Former Smoker  . Years: 2.00  . Types: Cigarettes  . Quit date: 53  . Years since quitting: 41.5  Smokeless Tobacco Never Used  Tobacco Comment    11/10/2017 "only smoked when I was on my period"    Allergies  Allergen Reactions  . Avelox [Moxifloxacin Hcl In Nacl] Other (See Comments)    unk  . Alendronate Sodium Nausea Only   Objective:  There were no vitals filed for this visit. There is no height or weight on file to calculate BMI. Constitutional Well developed. Well nourished.  Vascular Dorsalis pedis pulses palpable bilaterally. Posterior tibial pulses palpable bilaterally. Capillary refill normal to all digits.  No cyanosis or clubbing noted. Pedal hair growth normal.  Neurologic Normal speech. Oriented to person, place, and time. Epicritic sensation to light touch grossly present bilaterally.  Dermatologic Nails well groomed and normal in appearance. No open wounds. No skin lesions.  Orthopedic:  Pain on palpation to the bilateral midfoot hyperkeratotic lesion.  The lesion appeared to be due to underlying pes planus deformity with likely fat pad atrophy leading to aggressive pressure.  No open wounds or ulcers noted.  No clinical signs of infection noted.   Radiographs: None Assessment:   1. Benign skin lesion   2. Pes planovalgus   3. Pre-ulcerative corn or callous    Plan:  Patient was evaluated and treated and all questions answered.  Bilateral midfoot benign skin lesion with underlying pes planovalgus deformity with medial column collapse -Patient states the injection did give him some give her some relief.  She states that debridement does help however all of this is temporary.  She has not tried any kind of offloading. -Offloading pad was dispensed. Liliane Channel came by and evaluated her for possible measle brace with aggressive offloading of the midfoot lesions.  Patient will be scheduled to see Liliane Channel to get the mesial bracing. -I explained to the patient that the reason why these callus formation are happening are due to aggressive pressure to the medial column with underlying pes planovalgus deformity.  Patient  states understanding. -Using chisel blade and a handle the hyperkeratotic lesions were aggressively debrided down to healthy striated tissue.  No complication noted.   No follow-ups on file.

## 2019-11-09 ENCOUNTER — Other Ambulatory Visit: Payer: Self-pay

## 2019-11-09 ENCOUNTER — Ambulatory Visit: Payer: Medicare PPO | Admitting: Orthotics

## 2019-11-09 DIAGNOSIS — Q666 Other congenital valgus deformities of feet: Secondary | ICD-10-CM

## 2019-11-09 DIAGNOSIS — L989 Disorder of the skin and subcutaneous tissue, unspecified: Secondary | ICD-10-CM

## 2019-11-09 DIAGNOSIS — R2681 Unsteadiness on feet: Secondary | ICD-10-CM

## 2019-11-09 DIAGNOSIS — M778 Other enthesopathies, not elsewhere classified: Secondary | ICD-10-CM

## 2019-11-09 DIAGNOSIS — L84 Corns and callosities: Secondary | ICD-10-CM

## 2019-11-09 NOTE — Progress Notes (Signed)
Patient cast today for b/l mezzo braces to offload lateral/plantar ulcers.

## 2019-11-14 DIAGNOSIS — R399 Unspecified symptoms and signs involving the genitourinary system: Secondary | ICD-10-CM | POA: Diagnosis not present

## 2019-11-16 ENCOUNTER — Telehealth: Payer: Self-pay

## 2019-11-17 NOTE — Telephone Encounter (Signed)
Got to wait. I am not sure her symptoms are cardiac related

## 2019-11-17 NOTE — Telephone Encounter (Signed)
Patient feeling dizzy and pressure in head for 2 days. She is drinking extra water and increasing salt intake. She has seen PCP to rule out UTI. Urine culture not back yet. No chest pain or SOB.

## 2019-11-21 DIAGNOSIS — R42 Dizziness and giddiness: Secondary | ICD-10-CM | POA: Diagnosis not present

## 2019-11-21 DIAGNOSIS — N390 Urinary tract infection, site not specified: Secondary | ICD-10-CM | POA: Diagnosis not present

## 2019-11-29 ENCOUNTER — Ambulatory Visit: Payer: Medicare PPO | Admitting: Podiatry

## 2019-11-30 ENCOUNTER — Ambulatory Visit: Payer: Medicare PPO | Admitting: Podiatry

## 2019-11-30 ENCOUNTER — Telehealth: Payer: Self-pay | Admitting: Podiatry

## 2019-11-30 NOTE — Telephone Encounter (Signed)
pts husband called asking if we got pts orthotics in.Marland Kitchen Upon looking pt is waiting on a brace and we did get authorization from [facility] and the brace has been ordered and is in production. I explained I would call when brace comes in to schedule pt to pick it up. He stated pt is in a lot of pain and I did offer an appt with the doctor and he said she will see the doctor when the brace comes in. I told pts husband that if anything is to change to please call and we will get her in. He stated he knows we are doing the best care for the patient and is thankful for that.

## 2019-11-30 NOTE — Telephone Encounter (Signed)
Thank you for letting me know

## 2019-12-04 DIAGNOSIS — Z20822 Contact with and (suspected) exposure to covid-19: Secondary | ICD-10-CM | POA: Diagnosis not present

## 2019-12-06 DIAGNOSIS — N390 Urinary tract infection, site not specified: Secondary | ICD-10-CM | POA: Diagnosis not present

## 2019-12-06 DIAGNOSIS — R300 Dysuria: Secondary | ICD-10-CM | POA: Diagnosis not present

## 2019-12-06 DIAGNOSIS — I10 Essential (primary) hypertension: Secondary | ICD-10-CM | POA: Diagnosis not present

## 2019-12-06 DIAGNOSIS — G4733 Obstructive sleep apnea (adult) (pediatric): Secondary | ICD-10-CM | POA: Diagnosis not present

## 2019-12-06 DIAGNOSIS — F329 Major depressive disorder, single episode, unspecified: Secondary | ICD-10-CM | POA: Diagnosis not present

## 2019-12-06 DIAGNOSIS — R5383 Other fatigue: Secondary | ICD-10-CM | POA: Diagnosis not present

## 2019-12-06 DIAGNOSIS — D508 Other iron deficiency anemias: Secondary | ICD-10-CM | POA: Diagnosis not present

## 2019-12-06 DIAGNOSIS — R413 Other amnesia: Secondary | ICD-10-CM | POA: Diagnosis not present

## 2019-12-07 DIAGNOSIS — H90A21 Sensorineural hearing loss, unilateral, right ear, with restricted hearing on the contralateral side: Secondary | ICD-10-CM | POA: Diagnosis not present

## 2019-12-07 DIAGNOSIS — E039 Hypothyroidism, unspecified: Secondary | ICD-10-CM | POA: Diagnosis not present

## 2019-12-07 DIAGNOSIS — D333 Benign neoplasm of cranial nerves: Secondary | ICD-10-CM | POA: Diagnosis not present

## 2019-12-07 DIAGNOSIS — H90A32 Mixed conductive and sensorineural hearing loss, unilateral, left ear with restricted hearing on the contralateral side: Secondary | ICD-10-CM | POA: Diagnosis not present

## 2019-12-07 DIAGNOSIS — M25561 Pain in right knee: Secondary | ICD-10-CM | POA: Diagnosis not present

## 2019-12-07 DIAGNOSIS — Z822 Family history of deafness and hearing loss: Secondary | ICD-10-CM | POA: Diagnosis not present

## 2019-12-08 DIAGNOSIS — R413 Other amnesia: Secondary | ICD-10-CM | POA: Diagnosis not present

## 2019-12-08 DIAGNOSIS — D508 Other iron deficiency anemias: Secondary | ICD-10-CM | POA: Diagnosis not present

## 2019-12-08 DIAGNOSIS — G4733 Obstructive sleep apnea (adult) (pediatric): Secondary | ICD-10-CM | POA: Diagnosis not present

## 2019-12-08 DIAGNOSIS — F329 Major depressive disorder, single episode, unspecified: Secondary | ICD-10-CM | POA: Diagnosis not present

## 2019-12-08 DIAGNOSIS — M150 Primary generalized (osteo)arthritis: Secondary | ICD-10-CM | POA: Diagnosis not present

## 2019-12-08 DIAGNOSIS — I10 Essential (primary) hypertension: Secondary | ICD-10-CM | POA: Diagnosis not present

## 2019-12-11 DIAGNOSIS — M1990 Unspecified osteoarthritis, unspecified site: Secondary | ICD-10-CM | POA: Diagnosis not present

## 2019-12-11 DIAGNOSIS — M25562 Pain in left knee: Secondary | ICD-10-CM | POA: Diagnosis not present

## 2019-12-11 DIAGNOSIS — M25569 Pain in unspecified knee: Secondary | ICD-10-CM | POA: Diagnosis not present

## 2019-12-12 ENCOUNTER — Other Ambulatory Visit: Payer: Self-pay | Admitting: Neurology

## 2019-12-13 ENCOUNTER — Ambulatory Visit (INDEPENDENT_AMBULATORY_CARE_PROVIDER_SITE_OTHER): Payer: Medicare PPO | Admitting: Neurology

## 2019-12-13 ENCOUNTER — Ambulatory Visit: Payer: Medicare PPO | Admitting: Cardiology

## 2019-12-13 ENCOUNTER — Other Ambulatory Visit: Payer: Self-pay

## 2019-12-13 ENCOUNTER — Encounter: Payer: Self-pay | Admitting: Neurology

## 2019-12-13 VITALS — BP 146/77 | HR 76 | Ht 65.0 in | Wt 151.5 lb

## 2019-12-13 DIAGNOSIS — Z9989 Dependence on other enabling machines and devices: Secondary | ICD-10-CM | POA: Diagnosis not present

## 2019-12-13 DIAGNOSIS — G4733 Obstructive sleep apnea (adult) (pediatric): Secondary | ICD-10-CM

## 2019-12-13 DIAGNOSIS — R413 Other amnesia: Secondary | ICD-10-CM

## 2019-12-13 DIAGNOSIS — G629 Polyneuropathy, unspecified: Secondary | ICD-10-CM | POA: Diagnosis not present

## 2019-12-13 DIAGNOSIS — G4489 Other headache syndrome: Secondary | ICD-10-CM | POA: Diagnosis not present

## 2019-12-13 NOTE — Progress Notes (Deleted)
Primary Physician/Referring:  Deland Pretty, MD  [name], female    DOB: Feb 10, 1941, 79 y.o.   MRN: [id number]  No chief complaint on file.  HPI:    Cynthia [name]  is a 79 y.o. Caucasian female with hypertension, hyperlipidemia, frequent UTI,  paroxysmal atrial fibrillation, atrial tachycardia, obstructive sleep apnea on CPAP. She has surgery planned 06/26/19, colporrhaphy at [facility] anticoagulation well. Flecainide discontinued due to dizziness. No recurrence of AF.   She was not orthostatic on her last office visit and also today and suspect she has UTI with every severe orthostasis she has had in past with dizziness. C/O foot pain bilateral left > right.   ***Has been seeing PT, podiatry, and urology regularly.   Past Medical History:  Diagnosis Date  . Acute blood loss anemia 10/27/2017  . Acute GI bleeding 10/27/2017  . Anemia   . Arthritis    "joints" (11/10/2017)  . Dizziness   . GERD (gastroesophageal reflux disease)   . GI bleed 11/06/2017  . Hearing loss   . History of blood transfusion 10/2017  . Hyperlipidemia   . Hyperthyroidism   . Melanoma ([place])    "cut off my back"  . Memory loss   . Sinus headache   . Sleep apnea    Past Surgical History:  Procedure Laterality Date  . ABDOMINAL HYSTERECTOMY     "partial"  . APPENDECTOMY    . BIOPSY  11/17/2017   Procedure: BIOPSY;  Surgeon: Clarene Essex, MD;  [facility];  Service: Endoscopy;;  . COLONOSCOPY WITH PROPOFOL N/A 11/17/2017   Procedure: COLONOSCOPY WITH PROPOFOL;  Surgeon: Clarene Essex, MD;  [facility];  Service: Endoscopy;  Laterality: N/A;  . CRANIECTOMY FOR EXCISION OF ACOUSTIC NEUROMA    . ESOPHAGOGASTRODUODENOSCOPY (EGD) WITH PROPOFOL N/A 10/28/2017   Procedure: ESOPHAGOGASTRODUODENOSCOPY (EGD) WITH PROPOFOL;  Surgeon: Clarene Essex, MD;  [facility];  Service: Endoscopy;  Laterality: N/A;  . KNEE ARTHROSCOPY Left   . LEFT HEART CATH AND CORONARY  ANGIOGRAPHY N/A 11/24/2016   Procedure: LEFT HEART CATH AND CORONARY ANGIOGRAPHY;  Surgeon: Jettie Booze, MD;  Location: [place] [facility];  Service: Cardiovascular;  Laterality: N/A;  . MELANOMA EXCISION     "off my back"  . NASAL SINUS SURGERY    . TONSILLECTOMY     Family History  Problem Relation Age of Onset  . Congestive Heart Failure Mother   . Heart disease Father        History of heart attacks at a later age.    Marland Kitchen Heart disease Brother   . Hearing loss Brother     Social History   Tobacco Use  . Smoking status: Former Smoker    Years: 2.00    Types: Cigarettes    Quit date: 1980    Years since quitting: 41.6  . Smokeless tobacco: Never Used  . Tobacco comment: 11/10/2017 "only smoked when I was on my period"  Substance Use Topics  . Alcohol use: Yes    Comment: 11/10/2017 "nothing lately; did have 1 glass of wine q night"  *** ROS  Review of Systems  Cardiovascular: Negative for chest pain, dyspnea on exertion and leg swelling.  Gastrointestinal: Negative for melena.  Neurological: Positive for dizziness and light-headedness.   Objective  There were no vitals taken for this visit.  Vitals with BMI 07/24/2019 06/30/2019 06/30/2019  Height 5\' 5"  - -  Weight 159 lbs - -  BMI 26.46 - -  Systolic 458 099 -  Diastolic 76 88 -  Pulse 65 84 89    No data found. ***  Physical Exam Cardiovascular:     Rate and Rhythm: Normal rate and regular rhythm.     Pulses: Intact distal pulses.     Heart sounds: Normal heart sounds. No murmur heard.  No gallop.      Comments: No leg edema, no JVD. Pulmonary:     Effort: Pulmonary effort is normal.     Breath sounds: Normal breath sounds.  Abdominal:     General: Bowel sounds are normal.     Palpations: Abdomen is soft.    Laboratory examination:   CMP Latest Ref Rng & Units 06/30/2019 12/14/2018 09/21/2018  Glucose 70 - 99 mg/dL 118(H) 108(H) 104(H)  BUN 8 - 23 mg/dL 16 21 21   Creatinine 0.44 - 1.00 mg/dL 0.67  0.97 1.15(H)  Sodium 135 - 145 mmol/L 136 134(L) 140  Potassium 3.5 - 5.1 mmol/L 3.2(L) 4.0 3.8  Chloride 98 - 111 mmol/L 101 101 103  CO2 22 - 32 mmol/L 25 25 26   Calcium 8.9 - 10.3 mg/dL 8.9 8.5(L) 9.1  Total Protein 6.5 - 8.1 g/dL - 5.6(L) -  Total Bilirubin 0.3 - 1.2 mg/dL - 0.2(L) -  Alkaline Phos 38 - 126 U/L - 47 -  AST 15 - 41 U/L - 27 -  ALT 0 - 44 U/L - 15 -   CBC Latest Ref Rng & Units 06/30/2019 12/14/2018 09/21/2018  WBC 4.0 - 10.5 K/uL 11.0(H) 5.6 9.2  Hemoglobin 12.0 - 15.0 g/dL 10.6(L) 9.3(L) 11.4(L)  Hematocrit 36 - 46 % 33.7(L) 29.4(L) 35.5(L)  Platelets 150 - 400 K/uL 233 188 198   Lipid Panel     Component Value Date/Time   CHOL 165 11/24/2016 0040   TRIG 88 11/24/2016 0040   HDL 61 11/24/2016 0040   CHOLHDL 2.7 11/24/2016 0040   VLDL 18 11/24/2016 0040   LDLCALC 86 11/24/2016 0040   HEMOGLOBIN A1C Lab Results  Component Value Date   HGBA1C 5.6 11/24/2016   MPG 114 11/24/2016   TSH Recent Labs    12/14/18 1905  TSH 0.855    External labs:   HDL 67.000 10/13/2019 LDL-C 94.000 10/13/2019 Cholesterol, total 145.000 09/01/2019 Triglycerides 80.000 10/13/2019  A1C 5.600 11/24/2016 Glucose Random 118.000 06/30/2019  BUN 21.000 MG 09/01/2019 Creatinine, Serum 0.710 MG/ 09/01/2019  TSH 0.870 10/13/2019  Medications and allergies   Allergies  Allergen Reactions  . Avelox [Moxifloxacin Hcl In Nacl] Other (See Comments)    unk  . Alendronate Sodium Nausea Only     Current Outpatient Medications  Medication Instructions  . acetaminophen (TYLENOL) 500 mg, Oral, 2 times daily  . Alpha-Lipoic Acid (LIPOIC ACID PO) 2 tablets, Oral, 2 times daily  . atorvastatin (LIPITOR) 10 MG tablet TAKE 1 TABLET BY MOUTH EVERY DAY  . Calcium Carbonate-Vitamin D3 600-400 MG-UNIT TABS 1 tablet, Oral, 2 times daily  . cholecalciferol (VITAMIN D) 1,000 Units, Oral, Daily  . diltiazem (CARDIZEM CD) 120 MG 24 hr capsule TAKE 1 CAPSULE BY MOUTH DAILY *NEED OFFICE VISIT FOR  REFILLS*  . diltiazem (TIAZAC) 120 MG 24 hr capsule Oral  . DULoxetine (CYMBALTA) 30 MG capsule TAKE 1 CAPSULE BY MOUTH TWICE A DAY  . ELIQUIS 5 MG TABS tablet TAKE 1 TABLET BY MOUTH TWICE A DAY  . fluticasone (FLONASE) 50 MCG/ACT nasal spray Nasal  . GLUCOSAMINE-CHONDROITIN PO 2 tablets, Oral, Daily with lunch, Glucosamine 1500mg  Chondroitin 1200mg   .  ibuprofen (ADVIL) 200 mg, Oral, Every 6 hours PRN  . Lutein-Zeaxanthin 25-5 MG CAPS 1 tablet, Oral, Daily  . melatonin 3 mg, Oral, Daily at bedtime  . Multiple Vitamin (MULTIVITAMIN WITH MINERALS) TABS tablet 1 tablet, Oral, Daily  . nitroGLYCERIN (NITROLINGUAL) 0.4 MG/SPRAY spray 1 spray, Sublingual, Every 5 min x3 PRN  . Omega-3 1,000 mg, Oral, Daily  . pantoprazole (PROTONIX) 40 mg, Oral, Daily before breakfast  . simethicone (MYLICON) 106 MG chewable tablet Oral  . sucralfate (CARAFATE) 1 g, Oral, 3 times daily  . thyroid (ARMOUR) 60 mg, Oral, Daily before breakfast  . tiZANidine (ZANAFLEX) 4 mg, Oral, 3 times daily PRN  . traMADol (ULTRAM) 50 MG tablet 1 tablet, Oral, Every 6 hours PRN  . vitamin C (ASCORBIC ACID) 1,000 mg, Oral, Daily    Radiology:   No results found.  Cardiac Studies:   Event Monitor for 30 days Start date 07/01/2018 for syncope : A. Fib one episode, no symptoms reported. No heart block or significant bradycardia  Carotid duplex 03/27/2018:  Right Carotid: Velocities in the right ICA are consistent with a 1-39% stenosis. Left Carotid: Velocities in the left ICA are consistent with a 1-39% stenosis. Vertebrals:  Bilateral vertebral arteries demonstrate antegrade flow. Subclavians: Normal flow hemodynamics were seen in bilateral subclavian arteries.   Event Monitor 30 days 11/11/2017: Predominant rhythm is normal sinus rhythm. Symptoms of fatigue reveals normal sinus rhythm. Asymptomatic atrial fibrillation and probable atypical atrial flutter noted on 11/13/2017, 11/18/2017 and 12/09/2017 at 3:00 AM. Occasional  PACs. Arrhythmia/PVC burden 6%.   Echo 11/09/2017: Left ventricle: The cavity size was normal. Systolic function was   normal. The estimated ejection fraction was in the range of 60% to 65%. Wall motion was normal; there were no regional wall   motion abnormalities. The study is not technically sufficient to   allow evaluation of LV diastolic function.Pulmonary arteries: PA peak pressure: 32 mm Hg (S).   Coronary angiogram 11/24/2016:  Normal coronaries; tortuous LAD  EKG:  ***  EKG 06/12/2019: Normal sinus rhythm at the rate of 77 bpm, normal axis, incomplete right bundle branch block.  Nonspecific T abnormality.  Normal QT interval. No significant change from  EKG 12/07/2018: left axis deviation, left anterior fascicular block not present.   EKG 06/20/2018: Atrial tachycardia at the rate of 133 bpm.  Left axis deviation, left anterior fascicular block.  Poor R-wave progression.  Incomplete right bundle branch block.  Assessment   No diagnosis found.  No orders of the defined types were placed in this encounter.   There are no discontinued medications.   This patients CHA2DS2-VASc Score 4 and yearly risk of stroke 4.8%. (HTN, F, >75)  Recommendations:   Cynthia [name]  is a 79 y.o.Caucasian female with hypertension, hyperlipidemia, frequent UTI,  paroxysmal atrial fibrillation, atrial tachycardia, obstructive sleep apnea on CPAP. She has surgery planned 06/26/19, colporrhaphy at [facility] anticoagulation well. Flecainide discontinued due to dizziness. No recurrence of AF.   She has surgery planned 06/26/19, colporrhaphy at [facility]. She was not orthostatic on her last office visit and also today and suspect her near syncope and severe dizziness is clearly related and UTI episodes.  Hopefully colporrhaphy will help her prevent recurrent UTI.  No change in the medications were done by me today, she is maintaining sinus rhythm, BP is controlled, surgical clearance letter has already  been sent to the surgeon.  I will see her back in 6 months or sooner if problems. Foot pain  is from callous in her feet.   Labs reviewed. She has chronic anemia and Hb has remained stable. Not sure if she has had complete work-up for the same.   ***Lipids well controlled. Check BMP.

## 2019-12-13 NOTE — Progress Notes (Signed)
[facility]  PATIENT: Cynthia [name] DOB: [birth year] _________________________________   HISTORICAL  CHIEF COMPLAINT:  Chief Complaint  Patient presents with  . Follow-up    RM 12 with husband. Last seen 07/24/19 by AL,NP. Just finished steroid pack for her knees. She also received a steroid inj in left knee.   . CPAP    DME: [facility].   . Memory Loss    Last MMSE 07/24/19 was 27/30. Today's MMSE 27/30    HISTORY OF PRESENT ILLNESS:  Cynthia [name] is a 79 y.o. woman with polyneuropathy, sleep apnea an memory loss.   She ad a cardiac arrest 11/06/2017  Update 12/13/2019: She is reporting continued problems with memory.   She has trouble remembering appointments.   She feels she is a little worse but the husband feels she is about the same.    She has had some problems with driving directions and has made some mistakes while trying to drive.    She is sleeping better since starting APAP 5-20 (average 14.5) with an AHI = 4.1 and compliance of 70% over last 30 days.    She wakes up less, has less nocturia.   She still wants to take a nap after lunch.     She had anemia that is better now.     She has had the vaccination in Jan/Feb for Covid-19.     MMSE - Mini Mental State Exam 12/13/2019 07/24/2019 11/29/2018  Orientation to time 3 2 3   Orientation to Place 5 5 5   Registration 3 3 3   Attention/ Calculation 3 5 5   Recall 2 3 3   Language- name 2 objects 2 2 2   Language- repeat 1 1 0  Language- follow 3 step command 3 3 3   Language- read & follow direction 1 1 1   Write a sentence 1 1 1   Copy design 1 1 1   Copy design-comments - 16 animals named 12 animals  Total score 25 27 27      EPWORTH SLEEPINESS SCALE  On a scale of 0 - 3 what is the chance of dozing:  Sitting and Reading:   1  Watching TV:    2 Sitting inactive in a public place: 1 Passenger in car for one hour: 3 Lying down to rest in the afternoon: 3 Sitting and talking to someone: 0 Sitting quietly  after lunch:  3 In a car, stopped in traffic:  0  Total (out of 24):   14/24 (moderate ESS)  REVIEW OF SYSTEMS: Constitutional: No fevers, chills, sweats, or change in appetite Eyes: No visual changes, double vision, eye pain Ear, nose and throat: Chronic sinusutis.  Bilat hearing loss, worse on left  Cardiovascular: No chest pain, palpitations Respiratory: No shortness of breath at rest or with exertion.   No wheezes.  Has OSA GastrointestinaI: No nausea, vomiting, diarrhea, abdominal pain, fecal incontinence.   Occ dysphagia Genitourinary: Frequent UTIs.  SOme frequency. Musculoskeletal: as above Integumentary: No rash, pruritus, skin lesions Neurological: as above Psychiatric: No depression at this time.  No anxiety Endocrine: No palpitations, diaphoresis, change in appetite.  Increased thirst Hematologic/Lymphatic: No anemia, purpura, petechiae. Allergic/Immunologic: No itchy/runny eyes, recent allergic reactions, rashes  ALLERGIES: Allergies  Allergen Reactions  . Avelox [Moxifloxacin Hcl In Nacl] Other (See Comments)    unk  . Alendronate Sodium Nausea Only    HOME MEDICATIONS:  Current Outpatient Medications:  .  acetaminophen (TYLENOL) 500 MG tablet, Take 500 mg by mouth 2 (two) times daily. ,  Disp: , Rfl:  .  Alpha-Lipoic Acid (LIPOIC ACID PO), Take 2 tablets by mouth 2 (two) times daily. , Disp: , Rfl:  .  atorvastatin (LIPITOR) 10 MG tablet, TAKE 1 TABLET BY MOUTH EVERY DAY, Disp: 90 tablet, Rfl: 3 .  Calcium Carbonate-Vitamin D3 600-400 MG-UNIT TABS, Take 1 tablet by mouth 2 (two) times a day. , Disp: , Rfl:  .  cholecalciferol (VITAMIN D) 1000 UNITS tablet, Take 1,000 Units by mouth daily., Disp: , Rfl:  .  desmopressin (DDAVP) 0.2 MG tablet, Take 3 tablets by mouth at bedtime., Disp: , Rfl:  .  diltiazem (CARDIZEM CD) 120 MG 24 hr capsule, TAKE 1 CAPSULE BY MOUTH DAILY *NEED OFFICE VISIT FOR REFILLS*, Disp: 30 capsule, Rfl: 5 .  DULoxetine (CYMBALTA) 30 MG  capsule, TAKE 1 CAPSULE BY MOUTH TWICE A DAY, Disp: 180 capsule, Rfl: 3 .  ELIQUIS 5 MG TABS tablet, TAKE 1 TABLET BY MOUTH TWICE A DAY, Disp: 60 tablet, Rfl: 5 .  fluticasone (FLONASE) 50 MCG/ACT nasal spray, Place into the nose., Disp: , Rfl:  .  GLUCOSAMINE-CHONDROITIN PO, Take 2 tablets by mouth daily with lunch. Glucosamine 1500mg  Chondroitin 1200mg , Disp: , Rfl:  .  ibuprofen (ADVIL) 200 MG tablet, Take 200 mg by mouth every 6 (six) hours as needed., Disp: , Rfl:  .  Lutein-Zeaxanthin 25-5 MG CAPS, Take 1 tablet by mouth daily., Disp: , Rfl:  .  Melatonin 3 MG TABS, Take 3 mg by mouth at bedtime., Disp: , Rfl:  .  Multiple Vitamin (MULTIVITAMIN WITH MINERALS) TABS tablet, Take 1 tablet by mouth daily., Disp: , Rfl:  .  nitroGLYCERIN (NITROLINGUAL) 0.4 MG/SPRAY spray, Place 1 spray under the tongue every 5 (five) minutes x 3 doses as needed for chest pain., Disp: , Rfl:  .  Omega-3 1000 MG CAPS, Take 1,000 mg by mouth daily., Disp: , Rfl:  .  pantoprazole (PROTONIX) 40 MG tablet, Take 40 mg by mouth daily before breakfast. , Disp: , Rfl: 1 .  sucralfate (CARAFATE) 1 g tablet, Take 1 g by mouth 3 (three) times daily. , Disp: , Rfl:  .  thyroid (ARMOUR) 60 MG tablet, Take 60 mg by mouth daily before breakfast., Disp: , Rfl:  .  tiZANidine (ZANAFLEX) 4 MG tablet, Take 1 tablet (4 mg total) by mouth 3 (three) times daily as needed., Disp: 90 tablet, Rfl: 5 .  traMADol (ULTRAM) 50 MG tablet, Take 1 tablet by mouth every 6 (six) hours as needed., Disp: , Rfl:  .  vitamin C (ASCORBIC ACID) 500 MG tablet, Take 1,000 mg by mouth daily. , Disp: , Rfl:  .  meclizine (ANTIVERT) 25 MG tablet, Take 1 tablet by mouth as needed., Disp: , Rfl:  .  sertraline (ZOLOFT) 25 MG tablet, Take 25 mg by mouth daily., Disp: , Rfl:   PAST MEDICAL HISTORY: Past Medical History:  Diagnosis Date  . Acute blood loss anemia 10/27/2017  . Acute GI bleeding 10/27/2017  . Anemia   . Arthritis    "joints" (11/10/2017)  .  Dizziness   . GERD (gastroesophageal reflux disease)   . GI bleed 11/06/2017  . Hearing loss   . History of blood transfusion 10/2017  . Hyperlipidemia   . Hyperthyroidism   . Melanoma ([place])    "cut off my back"  . Memory loss   . Sinus headache   . Sleep apnea     PAST SURGICAL HISTORY: Past Surgical History:  Procedure Laterality Date  .  ABDOMINAL HYSTERECTOMY     "partial"  . APPENDECTOMY    . BIOPSY  11/17/2017   Procedure: BIOPSY;  Surgeon: Clarene Essex, MD;  [facility];  Service: Endoscopy;;  . COLONOSCOPY WITH PROPOFOL N/A 11/17/2017   Procedure: COLONOSCOPY WITH PROPOFOL;  Surgeon: Clarene Essex, MD;  [facility];  Service: Endoscopy;  Laterality: N/A;  . CRANIECTOMY FOR EXCISION OF ACOUSTIC NEUROMA    . ESOPHAGOGASTRODUODENOSCOPY (EGD) WITH PROPOFOL N/A 10/28/2017   Procedure: ESOPHAGOGASTRODUODENOSCOPY (EGD) WITH PROPOFOL;  Surgeon: Clarene Essex, MD;  [facility];  Service: Endoscopy;  Laterality: N/A;  . KNEE ARTHROSCOPY Left   . LEFT HEART CATH AND CORONARY ANGIOGRAPHY N/A 11/24/2016   Procedure: LEFT HEART CATH AND CORONARY ANGIOGRAPHY;  Surgeon: Jettie Booze, MD;  Location: [place] [facility];  Service: Cardiovascular;  Laterality: N/A;  . MELANOMA EXCISION     "off my back"  . NASAL SINUS SURGERY    . TONSILLECTOMY      FAMILY HISTORY: Family History  Problem Relation Age of Onset  . Congestive Heart Failure Mother   . Heart disease Father        History of heart attacks at a later age.    Marland Kitchen Heart disease Brother   . Hearing loss Brother     SOCIAL HISTORY:  Social History   Socioeconomic History  . Marital status: Married    Spouse name: Not on file  . Number of children: 2  . Years of education: Not on file  . Highest education level: Not on file  Occupational History  . Not on file  Tobacco Use  . Smoking status: Former Smoker    Years: 2.00    Types: Cigarettes    Quit date: 1980    Years since quitting:  41.6  . Smokeless tobacco: Never Used  . Tobacco comment: 11/10/2017 "only smoked when I was on my period"  Vaping Use  . Vaping Use: Never used  Substance and Sexual Activity  . Alcohol use: Yes    Comment: 11/10/2017 "nothing lately; did have 1 glass of wine q night"  . Drug use: Never  . Sexual activity: Not Currently  Other Topics Concern  . Not on file  Social History Narrative   Right handed    Social Determinants of Health   Financial Resource Strain:   . Difficulty of Paying Living Expenses: Not on file  Food Insecurity:   . Worried About Charity fundraiser in the Last Year: Not on file  . Ran Out of Food in the Last Year: Not on file  Transportation Needs:   . Lack of Transportation (Medical): Not on file  . Lack of Transportation (Non-Medical): Not on file  Physical Activity:   . Days of Exercise per Week: Not on file  . Minutes of Exercise per Session: Not on file  Stress:   . Feeling of Stress : Not on file  Social Connections:   . Frequency of Communication with Friends and Family: Not on file  . Frequency of Social Gatherings with Friends and Family: Not on file  . Attends Religious Services: Not on file  . Active Member of Clubs or Organizations: Not on file  . Attends Archivist Meetings: Not on file  . Marital Status: Not on file  Intimate Partner Violence:   . Fear of Current or Ex-Partner: Not on file  . Emotionally Abused: Not on file  . Physically Abused: Not on file  . Sexually  Abused: Not on file     PHYSICAL EXAM  Vitals:   12/13/19 1424  BP: (!) 146/77  Pulse: 76  Weight: 151 lb 8 oz (68.7 kg)  Height: 5\' 5"  (1.651 m)    Body mass index is 25.21 kg/m.   General: The patient is well-developed and well-nourished and in no acute distress  Neck:   No occipital tenderness.  Skin: Extremities are without significant edema, rash.    Musculoskeletal: Mild tenderness over the subacromial bursae of the left shoulder and left  trochanteric bursa.     Neurologic Exam  Mental status: The patient is alert and oriented x 2 1/2 (wrong date) at the time of the examination.  See MMSE scores above for details.Marland Kitchen   Speech is normal.  Cranial nerves: Extraocular movements are full.   Facial strength and sensation is normal.  Trapezius strength is normal.   Reduced hearing on the left.   Motor:  Muscle bulk is normal.   Tone is normal. Strength is  5 / 5 in all 4 extremities.   Sensory: Sensory testing is intact to soft touch in the arms but reduced sensation to vibration in the toes.  Fairly normal sensation at the ankles.  Coordination: Cerebellar testing shows good finger-nose-finger and heel-to-shin   Gait and station: Station is normal.   Her gait is slightly wide.  Tandem gait is wide..    ASSESSMENT AND PLAN  OSA on CPAP  Polyneuropathy  Memory loss  Other headache syndrome   1.   She hast some difficulties with memory but scored 25/30 on the MMSE, similar to last year.  Mild AD cannot be ruled out but issues could also be due to reduced focus and attention.   2,   Continue APAP nightly.   She has some sleepiness.    If this worsens consider a low-dose of stimulant or if she also has sleepiness of wake promoting agent such as Nuvigil.   3.  Remain active, exercise as tolerated.     4.  continue alpha lipoic acid 600-800 mg daily for neuropathy 5.   rtc 6 months for scheduled visit if stable.    Jalonda Antigua A. Felecia Shelling, MD, PhD 10/13/5668, 1:41 PM Certified in Neurology, Clinical Neurophysiology, Sleep Medicine, Pain Medicine and Neuroimaging  [facility] [address], [place], [place] [address] [phone]

## 2019-12-15 ENCOUNTER — Ambulatory Visit (INDEPENDENT_AMBULATORY_CARE_PROVIDER_SITE_OTHER): Payer: Medicare PPO | Admitting: Podiatry

## 2019-12-15 ENCOUNTER — Other Ambulatory Visit: Payer: Self-pay

## 2019-12-15 DIAGNOSIS — M778 Other enthesopathies, not elsewhere classified: Secondary | ICD-10-CM

## 2019-12-15 DIAGNOSIS — B351 Tinea unguium: Secondary | ICD-10-CM

## 2019-12-15 DIAGNOSIS — M79675 Pain in left toe(s): Secondary | ICD-10-CM

## 2019-12-15 DIAGNOSIS — Z7901 Long term (current) use of anticoagulants: Secondary | ICD-10-CM

## 2019-12-15 DIAGNOSIS — M79674 Pain in right toe(s): Secondary | ICD-10-CM | POA: Diagnosis not present

## 2019-12-15 LAB — MULTIPLE MYELOMA PANEL, SERUM
Albumin SerPl Elph-Mcnc: 3.6 g/dL (ref 2.9–4.4)
Albumin/Glob SerPl: 1.4 (ref 0.7–1.7)
Alpha 1: 0.2 g/dL (ref 0.0–0.4)
Alpha2 Glob SerPl Elph-Mcnc: 0.7 g/dL (ref 0.4–1.0)
B-Globulin SerPl Elph-Mcnc: 0.8 g/dL (ref 0.7–1.3)
Gamma Glob SerPl Elph-Mcnc: 0.9 g/dL (ref 0.4–1.8)
Globulin, Total: 2.6 g/dL (ref 2.2–3.9)
IgA/Immunoglobulin A, Serum: 81 mg/dL (ref 64–422)
IgG (Immunoglobin G), Serum: 857 mg/dL (ref 586–1602)
IgM (Immunoglobulin M), Srm: 110 mg/dL (ref 26–217)
Total Protein: 6.2 g/dL (ref 6.0–8.5)

## 2019-12-15 LAB — VITAMIN B12: Vitamin B-12: 685 pg/mL (ref 232–1245)

## 2019-12-19 ENCOUNTER — Telehealth: Payer: Self-pay | Admitting: *Deleted

## 2019-12-19 NOTE — Telephone Encounter (Signed)
Called and spoke with pt about lab results per Dr. Sater note. She verbalized understanding.  

## 2019-12-19 NOTE — Telephone Encounter (Signed)
-----   Message from [name], MD sent at 12/15/2019  5:05 PM EDT ----- Please let the patient know that the lab work is fine.

## 2019-12-20 ENCOUNTER — Encounter: Payer: Self-pay | Admitting: Podiatry

## 2019-12-20 NOTE — Progress Notes (Signed)
Subjective:  [name], female    DOB: Sep 02, 1940,  MRN: [id number]  No chief complaint on file.   79 y.o. female presents with the above complaint.  Patient presents with bilateral midfoot hyperkeratotic lesion follow-up.  Patient states she is doing a little bit better.  She states the injection helps as well as debridement helps help.  She is still awaiting the brace that [facility] casted her for it.  She denies any other acute complaints.  She has thickened elongated dystrophic toenails x10 that she would like to have them debrided down.  She is not able to do it herself.  She denies seeing anyone else for it.  Her pain scale is 3 out of 10.  Pain on palpation to these nails.   Review of Systems: Negative except as noted in the HPI. Denies N/V/F/Ch.  Past Medical History:  Diagnosis Date  . Acute blood loss anemia 10/27/2017  . Acute GI bleeding 10/27/2017  . Anemia   . Arthritis    "joints" (11/10/2017)  . Dizziness   . GERD (gastroesophageal reflux disease)   . GI bleed 11/06/2017  . Hearing loss   . History of blood transfusion 10/2017  . Hyperlipidemia   . Hyperthyroidism   . Melanoma ([place])    "cut off my back"  . Memory loss   . Sinus headache   . Sleep apnea     Current Outpatient Medications:  .  acetaminophen (TYLENOL) 500 MG tablet, Take 500 mg by mouth 2 (two) times daily. , Disp: , Rfl:  .  Alpha-Lipoic Acid (LIPOIC ACID PO), Take 2 tablets by mouth 2 (two) times daily. , Disp: , Rfl:  .  atorvastatin (LIPITOR) 10 MG tablet, TAKE 1 TABLET BY MOUTH EVERY DAY, Disp: 90 tablet, Rfl: 3 .  Calcium Carbonate-Vitamin D3 600-400 MG-UNIT TABS, Take 1 tablet by mouth 2 (two) times a day. , Disp: , Rfl:  .  cholecalciferol (VITAMIN D) 1000 UNITS tablet, Take 1,000 Units by mouth daily., Disp: , Rfl:  .  desmopressin (DDAVP) 0.2 MG tablet, Take 3 tablets by mouth at bedtime., Disp: , Rfl:  .  diltiazem (CARDIZEM CD) 120 MG 24 hr capsule, TAKE 1 CAPSULE BY MOUTH  DAILY *NEED OFFICE VISIT FOR REFILLS*, Disp: 30 capsule, Rfl: 5 .  DULoxetine (CYMBALTA) 30 MG capsule, TAKE 1 CAPSULE BY MOUTH TWICE A DAY, Disp: 180 capsule, Rfl: 3 .  ELIQUIS 5 MG TABS tablet, TAKE 1 TABLET BY MOUTH TWICE A DAY, Disp: 60 tablet, Rfl: 5 .  fluticasone (FLONASE) 50 MCG/ACT nasal spray, Place into the nose., Disp: , Rfl:  .  GLUCOSAMINE-CHONDROITIN PO, Take 2 tablets by mouth daily with lunch. Glucosamine 1500mg  Chondroitin 1200mg , Disp: , Rfl:  .  ibuprofen (ADVIL) 200 MG tablet, Take 200 mg by mouth every 6 (six) hours as needed., Disp: , Rfl:  .  Lutein-Zeaxanthin 25-5 MG CAPS, Take 1 tablet by mouth daily., Disp: , Rfl:  .  meclizine (ANTIVERT) 25 MG tablet, Take 1 tablet by mouth as needed., Disp: , Rfl:  .  Melatonin 3 MG TABS, Take 3 mg by mouth at bedtime., Disp: , Rfl:  .  Multiple Vitamin (MULTIVITAMIN WITH MINERALS) TABS tablet, Take 1 tablet by mouth daily., Disp: , Rfl:  .  nitroGLYCERIN (NITROLINGUAL) 0.4 MG/SPRAY spray, Place 1 spray under the tongue every 5 (five) minutes x 3 doses as needed for chest pain., Disp: , Rfl:  .  Omega-3 1000 MG CAPS, Take 1,000  mg by mouth daily., Disp: , Rfl:  .  pantoprazole (PROTONIX) 40 MG tablet, Take 40 mg by mouth daily before breakfast. , Disp: , Rfl: 1 .  sertraline (ZOLOFT) 25 MG tablet, Take 25 mg by mouth daily., Disp: , Rfl:  .  sucralfate (CARAFATE) 1 g tablet, Take 1 g by mouth 3 (three) times daily. , Disp: , Rfl:  .  thyroid (ARMOUR) 60 MG tablet, Take 60 mg by mouth daily before breakfast., Disp: , Rfl:  .  tiZANidine (ZANAFLEX) 4 MG tablet, Take 1 tablet (4 mg total) by mouth 3 (three) times daily as needed., Disp: 90 tablet, Rfl: 5 .  traMADol (ULTRAM) 50 MG tablet, Take 1 tablet by mouth every 6 (six) hours as needed., Disp: , Rfl:  .  vitamin C (ASCORBIC ACID) 500 MG tablet, Take 1,000 mg by mouth daily. , Disp: , Rfl:   Social History   Tobacco Use  Smoking Status Former Smoker  . Years: 2.00  . Types:  Cigarettes  . Quit date: 20  . Years since quitting: 41.7  Smokeless Tobacco Never Used  Tobacco Comment   11/10/2017 "only smoked when I was on my period"    Allergies  Allergen Reactions  . Avelox [Moxifloxacin Hcl In Nacl] Other (See Comments)    unk  . Alendronate Sodium Nausea Only   Objective:  There were no vitals filed for this visit. There is no height or weight on file to calculate BMI. Constitutional Well developed. Well nourished.  Vascular Dorsalis pedis pulses palpable bilaterally. Posterior tibial pulses palpable bilaterally. Capillary refill normal to all digits.  No cyanosis or clubbing noted. Pedal hair growth normal.  Neurologic Normal speech. Oriented to person, place, and time. Epicritic sensation to light touch grossly present bilaterally.  Dermatologic  thickened elongated dystrophic toenails x10.  Mild pain on palpation. No open wounds. No skin lesions.  Orthopedic:  Pain on palpation to the bilateral midfoot hyperkeratotic lesion.  The lesion appeared to be due to underlying pes planus deformity with likely fat pad atrophy leading to aggressive pressure.  No open wounds or ulcers noted.  No clinical signs of infection noted.   Radiographs: None Assessment:   1. Capsulitis of right foot   2. Capsulitis of left foot   3. Pain due to onychomycosis of toenails of both feet   4. Chronic anticoagulation    Plan:  Patient was evaluated and treated and all questions answered.  Bilateral midfoot benign skin lesion with underlying pes planovalgus deformity with medial column collapse -Patient states the injection did give him some give her some relief.  She states that debridement does help however all of this is temporary.  She has not tried any kind of offloading. -Given the amount of pain that she is clinically having, I believe patient will benefit from steroid injection to help decrease the inflammation from capsulitis and therefore the pain.  Patient  states understanding would like to proceed with a steroid injection of bilateral midfoot -A steroid injection was performed at bilateral midfoot using 1% plain Lidocaine and 10 mg of Kenalog. This was well tolerated. -Patient still awaiting the miso brace as I can take anywhere from 8 to 10 weeks. -I explained to the patient that the reason why these callus formation are happening are due to aggressive pressure to the medial column with underlying pes planovalgus deformity.  Patient states understanding. -Using chisel blade and a handle the hyperkeratotic lesions were aggressively debrided down to healthy striated  tissue.  No complication noted.  Onychomycosis with pain  -Nails palliatively debrided as below. -Educated on self-care  Procedure: Nail Debridement Rationale: pain  Type of Debridement: manual, sharp debridement. Instrumentation: Nail nipper, rotary burr. Number of Nails: 10  Procedures and Treatment: Consent by patient was obtained for treatment procedures. The patient understood the discussion of treatment and procedures well. All questions were answered thoroughly reviewed. Debridement of mycotic and hypertrophic toenails, 1 through 5 bilateral and clearing of subungual debris. No ulceration, no infection noted.  Return Visit-Office Procedure: Patient instructed to return to the office for a follow up visit 3 months for continued evaluation and treatment.  Boneta Lucks, DPM    No follow-ups on file.     No follow-ups on file.

## 2019-12-28 DIAGNOSIS — Z23 Encounter for immunization: Secondary | ICD-10-CM | POA: Diagnosis not present

## 2019-12-28 DIAGNOSIS — F329 Major depressive disorder, single episode, unspecified: Secondary | ICD-10-CM | POA: Diagnosis not present

## 2020-01-04 ENCOUNTER — Other Ambulatory Visit: Payer: Self-pay

## 2020-01-04 ENCOUNTER — Ambulatory Visit: Payer: Medicare PPO | Admitting: Orthotics

## 2020-01-04 ENCOUNTER — Encounter: Payer: Self-pay | Admitting: Podiatry

## 2020-01-04 ENCOUNTER — Ambulatory Visit (INDEPENDENT_AMBULATORY_CARE_PROVIDER_SITE_OTHER): Payer: Medicare PPO | Admitting: Podiatry

## 2020-01-04 DIAGNOSIS — M778 Other enthesopathies, not elsewhere classified: Secondary | ICD-10-CM

## 2020-01-04 DIAGNOSIS — Q666 Other congenital valgus deformities of feet: Secondary | ICD-10-CM | POA: Diagnosis not present

## 2020-01-04 DIAGNOSIS — M25569 Pain in unspecified knee: Secondary | ICD-10-CM | POA: Diagnosis not present

## 2020-01-04 DIAGNOSIS — R2681 Unsteadiness on feet: Secondary | ICD-10-CM | POA: Diagnosis not present

## 2020-01-04 DIAGNOSIS — M1990 Unspecified osteoarthritis, unspecified site: Secondary | ICD-10-CM | POA: Diagnosis not present

## 2020-01-04 DIAGNOSIS — M25561 Pain in right knee: Secondary | ICD-10-CM | POA: Diagnosis not present

## 2020-01-04 NOTE — Progress Notes (Signed)
Subjective:  [name], female    DOB: 07-May-1940,  MRN: [id number]  Chief Complaint  Patient presents with  . Callouses    B/L- pt states callus are now causing discomfort when walking     79 y.o. female presents with the above complaint.  Patient presents with bilateral midfoot hyperkeratotic lesion follow-up.  Patient states she is doing better.  The injection helps.  She denies any other acute complaints.  She is here for the miso brace pickup.  Review of Systems: Negative except as noted in the HPI. Denies N/V/F/Ch.  Past Medical History:  Diagnosis Date  . Acute blood loss anemia 10/27/2017  . Acute GI bleeding 10/27/2017  . Anemia   . Arthritis    "joints" (11/10/2017)  . Dizziness   . GERD (gastroesophageal reflux disease)   . GI bleed 11/06/2017  . Hearing loss   . History of blood transfusion 10/2017  . Hyperlipidemia   . Hyperthyroidism   . Melanoma ([place])    "cut off my back"  . Memory loss   . Sinus headache   . Sleep apnea     Current Outpatient Medications:  .  acetaminophen (TYLENOL) 500 MG tablet, Take 500 mg by mouth 2 (two) times daily. , Disp: , Rfl:  .  Alpha-Lipoic Acid (LIPOIC ACID PO), Take 2 tablets by mouth 2 (two) times daily. , Disp: , Rfl:  .  atorvastatin (LIPITOR) 10 MG tablet, TAKE 1 TABLET BY MOUTH EVERY DAY, Disp: 90 tablet, Rfl: 3 .  Calcium Carbonate-Vitamin D3 600-400 MG-UNIT TABS, Take 1 tablet by mouth 2 (two) times a day. , Disp: , Rfl:  .  cholecalciferol (VITAMIN D) 1000 UNITS tablet, Take 1,000 Units by mouth daily., Disp: , Rfl:  .  desmopressin (DDAVP) 0.2 MG tablet, Take 3 tablets by mouth at bedtime., Disp: , Rfl:  .  diltiazem (CARDIZEM CD) 120 MG 24 hr capsule, TAKE 1 CAPSULE BY MOUTH DAILY *NEED OFFICE VISIT FOR REFILLS*, Disp: 30 capsule, Rfl: 5 .  DULoxetine (CYMBALTA) 30 MG capsule, TAKE 1 CAPSULE BY MOUTH TWICE A DAY, Disp: 180 capsule, Rfl: 3 .  ELIQUIS 5 MG TABS tablet, TAKE 1 TABLET BY MOUTH TWICE A DAY,  Disp: 60 tablet, Rfl: 5 .  fluticasone (FLONASE) 50 MCG/ACT nasal spray, Place into the nose., Disp: , Rfl:  .  GLUCOSAMINE-CHONDROITIN PO, Take 2 tablets by mouth daily with lunch. Glucosamine 1500mg  Chondroitin 1200mg , Disp: , Rfl:  .  ibuprofen (ADVIL) 200 MG tablet, Take 200 mg by mouth every 6 (six) hours as needed., Disp: , Rfl:  .  Lutein-Zeaxanthin 25-5 MG CAPS, Take 1 tablet by mouth daily., Disp: , Rfl:  .  meclizine (ANTIVERT) 25 MG tablet, Take 1 tablet by mouth as needed., Disp: , Rfl:  .  Melatonin 3 MG TABS, Take 3 mg by mouth at bedtime., Disp: , Rfl:  .  Multiple Vitamin (MULTIVITAMIN WITH MINERALS) TABS tablet, Take 1 tablet by mouth daily., Disp: , Rfl:  .  nitroGLYCERIN (NITROLINGUAL) 0.4 MG/SPRAY spray, Place 1 spray under the tongue every 5 (five) minutes x 3 doses as needed for chest pain., Disp: , Rfl:  .  Omega-3 1000 MG CAPS, Take 1,000 mg by mouth daily., Disp: , Rfl:  .  pantoprazole (PROTONIX) 40 MG tablet, Take 40 mg by mouth daily before breakfast. , Disp: , Rfl: 1 .  sertraline (ZOLOFT) 25 MG tablet, Take 25 mg by mouth daily., Disp: , Rfl:  .  sucralfate (CARAFATE) 1 g tablet, Take 1 g by mouth 3 (three) times daily. , Disp: , Rfl:  .  thyroid (ARMOUR) 60 MG tablet, Take 60 mg by mouth daily before breakfast., Disp: , Rfl:  .  tiZANidine (ZANAFLEX) 4 MG tablet, Take 1 tablet (4 mg total) by mouth 3 (three) times daily as needed., Disp: 90 tablet, Rfl: 5 .  traMADol (ULTRAM) 50 MG tablet, Take 1 tablet by mouth every 6 (six) hours as needed., Disp: , Rfl:  .  vitamin C (ASCORBIC ACID) 500 MG tablet, Take 1,000 mg by mouth daily. , Disp: , Rfl:   Social History   Tobacco Use  Smoking Status Former Smoker  . Years: 2.00  . Types: Cigarettes  . Quit date: 14  . Years since quitting: 41.7  Smokeless Tobacco Never Used  Tobacco Comment   11/10/2017 "only smoked when I was on my period"    Allergies  Allergen Reactions  . Avelox [Moxifloxacin Hcl In Nacl]  Other (See Comments)    unk  . Alendronate Sodium Nausea Only   Objective:  There were no vitals filed for this visit. There is no height or weight on file to calculate BMI. Constitutional Well developed. Well nourished.  Vascular Dorsalis pedis pulses palpable bilaterally. Posterior tibial pulses palpable bilaterally. Capillary refill normal to all digits.  No cyanosis or clubbing noted. Pedal hair growth normal.  Neurologic Normal speech. Oriented to person, place, and time. Epicritic sensation to light touch grossly present bilaterally.  Dermatologic  thickened elongated dystrophic toenails x10.  Mild pain on palpation. No open wounds. No skin lesions.  Orthopedic:  Pain on palpation to the bilateral midfoot hyperkeratotic lesion.  The lesion appeared to be due to underlying pes planus deformity with likely fat pad atrophy leading to aggressive pressure.  No open wounds or ulcers noted.  No clinical signs of infection noted.   Radiographs: None Assessment:   1. Pes planovalgus   2. Gait instability    Plan:  Patient was evaluated and treated and all questions answered.  Bilateral midfoot benign skin lesion with underlying pes planovalgus deformity with medial column collapse -At this time I will hold off on any kind of injection as patient does tend to get benefit from it however patient miso brace has arrived.  She will be scheduled to see Liliane Channel to be fitted for him after my appointment today.  I will see her back again in 6 weeks to reevaluate and reassess. -I explained to the patient that the reason why these callus formation are happening are due to aggressive pressure to the medial column with underlying pes planovalgus deformity.  Patient states understanding. -Using chisel blade and a handle the hyperkeratotic lesions were aggressively debrided down to healthy striated tissue.  No complication noted.   Boneta Lucks, DPM    No follow-ups on file.     No follow-ups  on file.

## 2020-01-04 NOTE — Progress Notes (Signed)
Couldn't give brace due to a bleeding trimmed callus; will reschedule

## 2020-01-15 ENCOUNTER — Other Ambulatory Visit: Payer: Self-pay

## 2020-01-15 ENCOUNTER — Ambulatory Visit: Payer: Medicare PPO | Admitting: Orthotics

## 2020-01-15 DIAGNOSIS — R2681 Unsteadiness on feet: Secondary | ICD-10-CM

## 2020-01-15 DIAGNOSIS — Q666 Other congenital valgus deformities of feet: Secondary | ICD-10-CM

## 2020-01-15 NOTE — Progress Notes (Signed)
Didn't picku up brace because discomfort. I will pad around the edge of shell.

## 2020-01-17 ENCOUNTER — Other Ambulatory Visit: Payer: Self-pay | Admitting: Internal Medicine

## 2020-01-17 DIAGNOSIS — S0990XA Unspecified injury of head, initial encounter: Secondary | ICD-10-CM

## 2020-01-17 DIAGNOSIS — M25561 Pain in right knee: Secondary | ICD-10-CM | POA: Diagnosis not present

## 2020-01-17 DIAGNOSIS — R519 Headache, unspecified: Secondary | ICD-10-CM | POA: Diagnosis not present

## 2020-01-24 ENCOUNTER — Encounter (HOSPITAL_BASED_OUTPATIENT_CLINIC_OR_DEPARTMENT_OTHER): Payer: Self-pay | Admitting: Emergency Medicine

## 2020-01-24 ENCOUNTER — Emergency Department (HOSPITAL_BASED_OUTPATIENT_CLINIC_OR_DEPARTMENT_OTHER): Payer: Medicare PPO

## 2020-01-24 ENCOUNTER — Emergency Department (HOSPITAL_BASED_OUTPATIENT_CLINIC_OR_DEPARTMENT_OTHER)
Admission: EM | Admit: 2020-01-24 | Discharge: 2020-01-24 | Disposition: A | Discharge status: Home / Self Care | Payer: Medicare PPO | Attending: Emergency Medicine | Admitting: Emergency Medicine

## 2020-01-24 ENCOUNTER — Other Ambulatory Visit: Payer: Self-pay

## 2020-01-24 DIAGNOSIS — R079 Chest pain, unspecified: Secondary | ICD-10-CM | POA: Insufficient documentation

## 2020-01-24 DIAGNOSIS — Z87891 Personal history of nicotine dependence: Secondary | ICD-10-CM | POA: Diagnosis not present

## 2020-01-24 DIAGNOSIS — Z79899 Other long term (current) drug therapy: Secondary | ICD-10-CM | POA: Diagnosis not present

## 2020-01-24 DIAGNOSIS — Z7951 Long term (current) use of inhaled steroids: Secondary | ICD-10-CM | POA: Diagnosis not present

## 2020-01-24 DIAGNOSIS — R9089 Other abnormal findings on diagnostic imaging of central nervous system: Secondary | ICD-10-CM

## 2020-01-24 DIAGNOSIS — Z955 Presence of coronary angioplasty implant and graft: Secondary | ICD-10-CM | POA: Insufficient documentation

## 2020-01-24 DIAGNOSIS — R9402 Abnormal brain scan: Secondary | ICD-10-CM | POA: Insufficient documentation

## 2020-01-24 DIAGNOSIS — I10 Essential (primary) hypertension: Secondary | ICD-10-CM | POA: Insufficient documentation

## 2020-01-24 DIAGNOSIS — M542 Cervicalgia: Secondary | ICD-10-CM | POA: Insufficient documentation

## 2020-01-24 DIAGNOSIS — R519 Headache, unspecified: Secondary | ICD-10-CM

## 2020-01-24 DIAGNOSIS — J90 Pleural effusion, not elsewhere classified: Secondary | ICD-10-CM | POA: Diagnosis not present

## 2020-01-24 LAB — CBC
HCT: 32.8 % — ABNORMAL LOW (ref 36.0–46.0)
Hemoglobin: 10.9 g/dL — ABNORMAL LOW (ref 12.0–15.0)
MCH: 30.2 pg (ref 26.0–34.0)
MCHC: 33.2 g/dL (ref 30.0–36.0)
MCV: 90.9 fL (ref 80.0–100.0)
Platelets: 197 10*3/uL (ref 150–400)
RBC: 3.61 MIL/uL — ABNORMAL LOW (ref 3.87–5.11)
RDW: 13.5 % (ref 11.5–15.5)
WBC: 6.4 10*3/uL (ref 4.0–10.5)
nRBC: 0 % (ref 0.0–0.2)

## 2020-01-24 LAB — BASIC METABOLIC PANEL
Anion gap: 9 (ref 5–15)
BUN: 25 mg/dL — ABNORMAL HIGH (ref 8–23)
CO2: 26 mmol/L (ref 22–32)
Calcium: 9 mg/dL (ref 8.9–10.3)
Chloride: 100 mmol/L (ref 98–111)
Creatinine, Ser: 0.58 mg/dL (ref 0.44–1.00)
GFR, Estimated: >60 mL/min (ref >60)
Glucose, Bld: 97 mg/dL (ref 70–99)
Potassium: 3.5 mmol/L (ref 3.5–5.1)
Sodium: 135 mmol/L (ref 135–145)

## 2020-01-24 LAB — TROPONIN I (HIGH SENSITIVITY)
Troponin I (High Sensitivity): 5 ng/L (ref <18)
Troponin I (High Sensitivity): 5 ng/L (ref <18)

## 2020-01-24 NOTE — ED Triage Notes (Addendum)
Ongoing headache above her R eye after falling on 9/13. Has not had any medications. She is on Eliquis. Also c/o neck pain

## 2020-01-24 NOTE — Discharge Instructions (Signed)
Your labwork and imaging were reassuring today.  Your CT scan did show findings concerning for possible increased fluid in your brain - please follow up with [facility] regarding this.  I would recommend Tylenol as needed for your headache.  Follow up with your PCP regarding the chest pain you had today.   Return to the ED for any worsening symptoms.

## 2020-01-24 NOTE — ED Notes (Signed)
ED Provider at bedside. 

## 2020-01-24 NOTE — ED Notes (Addendum)
Called to ED [facility] to check on pt-pt c/o central CP started "just a few minutes ago"-NAD-taken to triage via w/c-reassessment done with CP order set initiated

## 2020-01-24 NOTE — ED Provider Notes (Signed)
[facility] EMERGENCY DEPARTMENT Provider Note   CSN: [id number] Arrival date & time: 01/24/20  1107     History Chief Complaint  Patient presents with  . Headache  . Chest Pain    Cynthia [name] is a 79 y.o. female with PMHx hyperthyroidism, HLD, dizziness, GERD, A fib on Eliquis, and memory loss who presents to the ED today with complaint of sudden onset, constant, achy, R frontal headache s/p mechanical fall that occurred on 12/25/19. Pt reports she tripped and fell, landing face first onto the hardwood floors. No LOC. Pt is anticoagulated. She reports seeing her PCP for the persistent headache recently and an outpatient CT Head was ordered for 10/19. The headache has progressively been getting worse since then and they were advised by PCP to come to this ED for CT Head today. Pt mentions that while getting out of the car this morning she felt extremely unbalanced on her feet - she states this has been occurring for the last month since the fall as well. She normally ambulates with a cane.   Pt also reports she woke up this morning with neck pain however assumed it was due to sleeping wrong.   She also mentions that while in the waiting room today she began having substernal chest pressure that lasted approximately 30 minutes before dissipating. Pt reports she had similar chest pain last week and her husband gave her some of his NTG with relief however it did cause a worsening headache at that time. Pt is not having any active chest pain currently. Pt denies diaphoresis, shortness of breath, nausea, vomiting, leg swelling, hemoptysis, or any other associated symptoms.   The history is provided by the patient, the spouse and medical records.       Past Medical History:  Diagnosis Date  . Acute blood loss anemia 10/27/2017  . Acute GI bleeding 10/27/2017  . Anemia   . Arthritis    "joints" (11/10/2017)  . Dizziness   . GERD (gastroesophageal reflux disease)   . GI bleed  11/06/2017  . Hearing loss   . History of blood transfusion 10/2017  . Hyperlipidemia   . Hyperthyroidism   . Melanoma ([place])    "cut off my back"  . Memory loss   . Sinus headache   . Sleep apnea     Patient Active Problem List   Diagnosis Date Noted  . Periodic limb movement disorder 08/30/2019  . Nocturia 07/01/2018  . Midline cystocele 06/08/2018  . Recurrent UTI 06/08/2018  . Vaginal vault prolapse 06/08/2018  . Lightheadedness 06/02/2018  . Near syncope   . Hypotension 03/17/2018  . Postural dizziness with presyncope 03/17/2018  . PAF (paroxysmal atrial fibrillation) ([place]) 03/17/2018  . Memory loss 11/25/2017  . Acute lower UTI 10/29/2017  . Abnormal stress test   . Chest pain 11/23/2016  . Hyperlipidemia 11/23/2016  . GERD (gastroesophageal reflux disease) 11/23/2016  . History of recurrent UTIs 11/23/2016  . Polyneuropathy 10/30/2016  . Bursitis, subacromial 10/30/2016  . Numbness 10/30/2016  . Arthritis, senescent 01/03/2016  . Left knee pain 01/03/2016  . Pain in joint, ankle and foot 02/28/2015  . Dry mouth 08/07/2014  . Dry eyes 08/07/2014  . Other headache syndrome 06/25/2014  . Sinusitis, chronic 06/25/2014  . Neck pain 06/25/2014  . OSA on CPAP 06/25/2014  . Essential hypertension, benign 06/25/2014    Past Surgical History:  Procedure Laterality Date  . ABDOMINAL HYSTERECTOMY     "partial"  . APPENDECTOMY    .  BIOPSY  11/17/2017   Procedure: BIOPSY;  Surgeon: Clarene Essex, MD;  Location: [facility];  Service: Endoscopy;;  . COLONOSCOPY WITH PROPOFOL N/A 11/17/2017   Procedure: COLONOSCOPY WITH PROPOFOL;  Surgeon: Clarene Essex, MD;  Location: [facility];  Service: Endoscopy;  Laterality: N/A;  . CRANIECTOMY FOR EXCISION OF ACOUSTIC NEUROMA    . ESOPHAGOGASTRODUODENOSCOPY (EGD) WITH PROPOFOL N/A 10/28/2017   Procedure: ESOPHAGOGASTRODUODENOSCOPY (EGD) WITH PROPOFOL;  Surgeon: Clarene Essex, MD;  Location: [facility];  Service: Endoscopy;  Laterality:  N/A;  . KNEE ARTHROSCOPY Left   . LEFT HEART CATH AND CORONARY ANGIOGRAPHY N/A 11/24/2016   Procedure: LEFT HEART CATH AND CORONARY ANGIOGRAPHY;  Surgeon: Jettie Booze, MD;  Location: [facility];  Service: Cardiovascular;  Laterality: N/A;  . MELANOMA EXCISION     "off my back"  . NASAL SINUS SURGERY    . TONSILLECTOMY       OB History   No obstetric history on file.     Family History  Problem Relation Age of Onset  . Congestive Heart Failure Mother   . Heart disease Father        History of heart attacks at a later age.    Marland Kitchen Heart disease Brother   . Hearing loss Brother     Social History   Tobacco Use  . Smoking status: Former Smoker    Years: 2.00    Types: Cigarettes    Quit date: 1980    Years since quitting: 41.8  . Smokeless tobacco: Never Used  . Tobacco comment: 11/10/2017 "only smoked when I was on my period"  Vaping Use  . Vaping Use: Never used  Substance Use Topics  . Alcohol use: Yes    Comment: 11/10/2017 "nothing lately; did have 1 glass of wine q night"  . Drug use: Never    Home Medications Prior to Admission medications   Medication Sig Start Date End Date Taking? Authorizing Provider  acetaminophen (TYLENOL) 500 MG tablet Take 500 mg by mouth 2 (two) times daily.     [provider]  Alpha-Lipoic Acid (LIPOIC ACID PO) Take 2 tablets by mouth 2 (two) times daily.     [provider]  atorvastatin (LIPITOR) 10 MG tablet TAKE 1 TABLET BY MOUTH EVERY DAY 04/25/19   Adrian Prows, MD  Calcium Carbonate-Vitamin D3 600-400 MG-UNIT TABS Take 1 tablet by mouth 2 (two) times a day.     [provider]  cholecalciferol (VITAMIN D) 1000 UNITS tablet Take 1,000 Units by mouth daily.    [provider]  desmopressin (DDAVP) 0.2 MG tablet Take 3 tablets by mouth at bedtime. 12/11/19   [provider]  diltiazem (CARDIZEM CD) 120 MG 24 hr capsule TAKE 1 CAPSULE BY MOUTH DAILY *NEED OFFICE VISIT FOR REFILLS*  01/02/19   Adrian Prows, MD  DULoxetine (CYMBALTA) 30 MG capsule TAKE 1 CAPSULE BY MOUTH TWICE A DAY 12/12/19   Sater, Nanine Means, MD  ELIQUIS 5 MG TABS tablet TAKE 1 TABLET BY MOUTH TWICE A DAY 05/08/19   Adrian Prows, MD  fluticasone (FLONASE) 50 MCG/ACT nasal spray Place into the nose.    [provider]  GLUCOSAMINE-CHONDROITIN PO Take 2 tablets by mouth daily with lunch. Glucosamine 1500mg  Chondroitin 1200mg     [provider]  ibuprofen (ADVIL) 200 MG tablet Take 200 mg by mouth every 6 (six) hours as needed.    [provider]  Lutein-Zeaxanthin 25-5 MG CAPS Take 1 tablet by mouth daily.  [provider]  meclizine (ANTIVERT) 25 MG tablet Take 1 tablet by mouth as needed.    [provider]  Melatonin 3 MG TABS Take 3 mg by mouth at bedtime.    [provider]  Multiple Vitamin (MULTIVITAMIN WITH MINERALS) TABS tablet Take 1 tablet by mouth daily.    [provider]  nitroGLYCERIN (NITROLINGUAL) 0.4 MG/SPRAY spray Place 1 spray under the tongue every 5 (five) minutes x 3 doses as needed for chest pain.    [provider]  Omega-3 1000 MG CAPS Take 1,000 mg by mouth daily.    [provider]  pantoprazole (PROTONIX) 40 MG tablet Take 40 mg by mouth daily before breakfast.  06/12/14   [provider]  sertraline (ZOLOFT) 25 MG tablet Take 25 mg by mouth daily.    [provider]  sucralfate (CARAFATE) 1 g tablet Take 1 g by mouth 3 (three) times daily.     [provider]  thyroid (ARMOUR) 60 MG tablet Take 60 mg by mouth daily before breakfast. 11/07/17   [provider]  tiZANidine (ZANAFLEX) 4 MG tablet Take 1 tablet (4 mg total) by mouth 3 (three) times daily as needed. 02/09/19   Sater, Nanine Means, MD  traMADol (ULTRAM) 50 MG tablet Take 1 tablet by mouth every 6 (six) hours as needed. 06/26/19   [provider]  vitamin C (ASCORBIC ACID) 500 MG tablet Take 1,000 mg by mouth  daily.     [provider]    Allergies    Avelox [moxifloxacin hcl in nacl] and Alendronate sodium  Review of Systems   Review of Systems  Constitutional: Negative for chills, diaphoresis and fever.  Eyes: Negative for visual disturbance.  Respiratory: Negative for cough and shortness of breath.   Cardiovascular: Positive for chest pain (resolved). Negative for palpitations and leg swelling.  Gastrointestinal: Negative for nausea and vomiting.  Musculoskeletal: Positive for gait problem.  Neurological: Positive for headaches. Negative for syncope, weakness and numbness.  All other systems reviewed and are negative.   Physical Exam Updated Vital Signs BP (!) 155/75 (BP Location: Right Arm)   Pulse 68   Temp 98.3 F (36.8 C) (Oral)   Resp 18   Ht 5\' 5"  (1.651 m)   Wt 69.9 kg   SpO2 98%   BMI 25.63 kg/m   Physical Exam Vitals and nursing note reviewed.  Constitutional:      Appearance: She is not ill-appearing or diaphoretic.  HENT:     Head: Normocephalic and atraumatic.  Eyes:     Extraocular Movements: Extraocular movements intact.     Right eye: No nystagmus.     Left eye: No nystagmus.     Conjunctiva/sclera: Conjunctivae normal.     Pupils: Pupils are equal, round, and reactive to light.  Cardiovascular:     Rate and Rhythm: Normal rate and regular rhythm.     Heart sounds: Normal heart sounds.  Pulmonary:     Effort: Pulmonary effort is normal.     Breath sounds: Normal breath sounds. No wheezing, rhonchi or rales.  Abdominal:     Palpations: Abdomen is soft.     Tenderness: There is no abdominal tenderness.  Musculoskeletal:     Cervical back: Neck supple.  Skin:    General: Skin is warm and dry.  Neurological:     Mental Status: She is alert.     Comments: CN 3-12 grossly intact A&O x4 GCS 15 Sensation and strength  intact Coordination with finger-to-nose WNL Neg pronator drift     ED Results / Procedures / Treatments   Labs (all  labs ordered are listed, but only abnormal results are displayed) Labs Reviewed  BASIC METABOLIC PANEL - Abnormal; Notable for the following components:      Result Value   BUN 25 (*)    All other components within normal limits  CBC - Abnormal; Notable for the following components:   RBC 3.61 (*)    Hemoglobin 10.9 (*)    HCT 32.8 (*)    All other components within normal limits  TROPONIN I (HIGH SENSITIVITY)  TROPONIN I (HIGH SENSITIVITY)    EKG None  Radiology DG Chest 2 View  Result Date: 01/24/2020 CLINICAL DATA:  Chest pain EXAM: CHEST - 2 VIEW COMPARISON:  2020 FINDINGS: Mild scarring at the lung bases. No consolidation or edema. No pleural effusion. No pneumothorax. Stable cardiomediastinal contours with normal heart size. IMPRESSION: No acute process in the chest. Electronically Signed   By: Macy Mis M.D.   On: 01/24/2020 12:43   CT HEAD WO CONTRAST  Result Date: 01/24/2020 CLINICAL DATA:  Prior head trauma with persistent headache. On Eliquis. EXAM: CT HEAD WITHOUT CONTRAST TECHNIQUE: Contiguous axial images were obtained from the base of the skull through the vertex without intravenous contrast. COMPARISON:  None. FINDINGS: Brain: No evidence of acute infarction, hemorrhage, extra-axial collection or mass lesion/mass effect. Similar ventriculomegaly, which is slightly out of proportion to the degree of sulcal volume loss. Similar patchy white matter hypoattenuation, likely secondary to chronic microvascular ischemic disease. Vascular: Calcific atherosclerosis. Skull: Postsurgical changes of partial left canal wall up mastoidectomy. No acute fracture. Sinuses/Orbits: Fluid and mucosal thickening involving the right sphenoid sinus. Remaining sinuses are clear. Other: No mastoid effusions. IMPRESSION: 1. No evidence of acute intracranial abnormality. 2. Similar ventriculomegaly, which is slightly out of proportion to the degree of sulcal volume loss. This is nonspecific and  may relate to central volume loss, but normal pressure hydrocephalus (NPH) could have a similar appearance. Recommend clinical correlation with the presence or absence of signs/symptoms of NPH. 3. Right sphenoid sinus disease. Electronically Signed   By: Margaretha Sheffield MD   On: 01/24/2020 12:39   CT CERVICAL SPINE WO CONTRAST  Result Date: 01/24/2020 CLINICAL DATA:  Trauma.  Neck pain EXAM: CT CERVICAL SPINE WITHOUT CONTRAST TECHNIQUE: Multidetector CT imaging of the cervical spine was performed without intravenous contrast. Multiplanar CT image reconstructions were also generated. COMPARISON:  None. FINDINGS: Alignment: Straightening of the normal cervical lordosis, likely positional. No substantial subluxation. Skull base and vertebrae: No acute fracture. Vertebral body heights are maintained. No primary bone lesion or focal pathologic process. Soft tissues and spinal canal: No prevertebral fluid or swelling. No visible canal hematoma. Disc levels: Advanced multilevel degenerative disc disease, greatest at C4-C5 C5-C6 and C6-C7, where there is disc height loss, endplate sclerosis, and posterior disc osteophyte complexes. No evidence of advanced bony canal stenosis. Fusion across the left facet joints at C3-C4. Upper chest: No consolidation. Other: None IMPRESSION: 1. No evidence of acute fracture or traumatic malalignment. 2. Advanced multilevel degenerative disc disease Electronically Signed   By: Margaretha Sheffield MD   On: 01/24/2020 12:44    Procedures Procedures (including critical care time)  Medications Ordered in ED Medications - No data to display  ED Course  I have reviewed the triage vital signs and the nursing notes.  Pertinent labs & imaging results that were available during my care  of the patient were reviewed by me and considered in my medical decision making (see chart for details).    MDM Rules/Calculators/A&P                          79 year old female with a history of  memory loss over the last couple of years who presents to the ED today complaining of a persistent headache to the right frontal area status post mechanical fall that occurred on 9/13. Patient is anticoagulated on Eliquis for paroxysmal A. fib. She was told by PCP to come to the ED to expedite her CT scan. Arrival to the ED patient is afebrile, nontachycardic and nontachypneic. It does appear she began having some substernal chest pain while in the waiting room and an EKG, troponin, chest x-ray were also ordered. Chest pain dissipated approximately 30 minutes later, no active chest pain currently. On exam patient is pleasant. She is alert and oriented x4. She has no focal neuro deficits on exam today. She does normally ambulate with a cane, will plan to ambulate for safety. Husband provides most of the history as he does report patient has had some memory issues over the past couple of years.  CT scan with findings of ventriculomegaly radiologist reads as nonspecific however concern for possible NPH. Patient does report some gait abnormalities since her fall 1 month ago. She has had issues with urinary incontinence and frequent UTIs over the past year s/2 cystocele and vaginal prolapse with anterior colporrhaphy performed by Dr. [place] Pink on 06/26/2019. She has also had memory issues over the past several years which sounds like early dementia; no recent changes since the fall 1 month ago. Findings not overtly consistent with  NPH however given CT findings will plan to touch base with neurology. Attending physician Dr. [name] has evaluated patient as well and agrees with plan.   CT C spine negative CXR clear  EKG with nonspecific T wave abnormality.  Troponin of 5. Will plan for repeat.   Attending physician Dr. [name] discussed case with Dr. Annice Pih who recommends outpatient follow up as case does not seem consistent with NPH at this time.   Repeat troponin unchanged at 5. Pt continues to not  having any chest pain at this time. Will discharge home with close PCP and neurology follow up. Pt and husband are in agreement with plan and pt stable for discharge.   This note was prepared using Dragon voice recognition software and may include unintentional dictation errors due to the inherent limitations of voice recognition software.  Final Clinical Impression(s) / ED Diagnoses Final diagnoses:  Bad headache  Nonspecific chest pain  Abnormal CT of brain    Rx / DC Orders ED Discharge Orders    None       Discharge Instructions     Your labwork and imaging were reassuring today.  Your CT scan did show findings concerning for possible increased fluid in your brain - please follow up with [facility] regarding this.  I would recommend Tylenol as needed for your headache.  Follow up with your PCP regarding the chest pain you had today.   Return to the ED for any worsening symptoms.        Eustaquio Maize, PA-C 01/24/20 1458    Maudie Flakes, MD 01/24/20 1515

## 2020-01-24 NOTE — ED Notes (Signed)
Pt ambulatory to restroom without assistance 

## 2020-01-25 DIAGNOSIS — R399 Unspecified symptoms and signs involving the genitourinary system: Secondary | ICD-10-CM | POA: Diagnosis not present

## 2020-01-29 ENCOUNTER — Ambulatory Visit (INDEPENDENT_AMBULATORY_CARE_PROVIDER_SITE_OTHER): Payer: Medicare PPO | Admitting: Neurology

## 2020-01-29 ENCOUNTER — Encounter: Payer: Self-pay | Admitting: Neurology

## 2020-01-29 ENCOUNTER — Other Ambulatory Visit: Payer: Self-pay

## 2020-01-29 ENCOUNTER — Other Ambulatory Visit: Payer: Medicare PPO | Admitting: Orthotics

## 2020-01-29 VITALS — BP 133/79 | HR 78 | Ht 65.0 in | Wt 148.5 lb

## 2020-01-29 DIAGNOSIS — M542 Cervicalgia: Secondary | ICD-10-CM | POA: Diagnosis not present

## 2020-01-29 DIAGNOSIS — G629 Polyneuropathy, unspecified: Secondary | ICD-10-CM

## 2020-01-29 DIAGNOSIS — R269 Unspecified abnormalities of gait and mobility: Secondary | ICD-10-CM | POA: Insufficient documentation

## 2020-01-29 DIAGNOSIS — R413 Other amnesia: Secondary | ICD-10-CM

## 2020-01-29 DIAGNOSIS — G4489 Other headache syndrome: Secondary | ICD-10-CM | POA: Diagnosis not present

## 2020-01-29 DIAGNOSIS — Z9989 Dependence on other enabling machines and devices: Secondary | ICD-10-CM

## 2020-01-29 DIAGNOSIS — G4733 Obstructive sleep apnea (adult) (pediatric): Secondary | ICD-10-CM

## 2020-01-29 NOTE — Progress Notes (Signed)
[facility]  PATIENT: Cynthia [name] DOB: 1940-05-12 _________________________________   HISTORICAL  CHIEF COMPLAINT:  Chief Complaint  Patient presents with  . Follow-up    RM 13 with husband. Last seen 12/13/19. Was seen in ED 01/24/20 for headaches. Referred to ab  CT. Dr Felecia Shelling reviewed CT 01/25/20 and said there were no changes from last years imaging.   Marland Kitchen Headache    Taking tylenol/ibuprofen. Improved since when she went to ED.    HISTORY OF PRESENT ILLNESS:  Cynthia [name] is a 79 y.o. woman with polyneuropathy, sleep apnea and memory loss.   She had a cardiac arrest 11/06/2017  Update 01/29/2020: She was seen in the ED after experiencing headaches 01/24/2020.  She hit the right forehead when she tripped and fell.   She had no LOC.   HA intensified last week and she went to the ED 01/24/2020.  HA pain was pressure like and goes back to the occiput.     Currently, pain is milder since taking ibuprofen and Tylenol.   She has some occipital pain even when the HA is not present.   CT of the neck and head showed no acute findings.   She has mild ventriculomegaly and advanced cervical DJD.    She notes urinary urgency with some incontinence at night.  This improved on desmopressin.   She has had some difficulty with her gait (some shuffling, worse over the last year).   She also has a sensation of an eyelash in her right eye.     She is on Eliquis for AFib.   She had angina last week and took NTG.  HA was worse after the NTG  Update 12/13/2019: She is reporting continued problems with memory.   She has trouble remembering appointments.   She feels she is a little worse but the husband feels she is about the same.    She has had some problems with driving directions and has made some mistakes while trying to drive.    She is sleeping better since starting APAP 5-20 (average 14.5) with an AHI = 4.1 and compliance of 70% over last 30 days.    She wakes up less, has less  nocturia.   She still wants to take a nap after lunch.     She had anemia that is better now.     She has had the vaccination in Jan/Feb for Covid-19.       REVIEW OF SYSTEMS: Constitutional: No fevers, chills, sweats, or change in appetite Eyes: No visual changes, double vision, eye pain Ear, nose and throat: Chronic sinusutis.  Bilat hearing loss, worse on left  Cardiovascular: No chest pain, palpitations Respiratory: No shortness of breath at rest or with exertion.   No wheezes.  Has OSA GastrointestinaI: No nausea, vomiting, diarrhea, abdominal pain, fecal incontinence.   Occ dysphagia Genitourinary: Frequent UTIs.  SOme frequency. Musculoskeletal: as above Integumentary: No rash, pruritus, skin lesions Neurological: as above Psychiatric: No depression at this time.  No anxiety Endocrine: No palpitations, diaphoresis, change in appetite.  Increased thirst Hematologic/Lymphatic: No anemia, purpura, petechiae. Allergic/Immunologic: No itchy/runny eyes, recent allergic reactions, rashes  ALLERGIES: Allergies  Allergen Reactions  . Avelox [Moxifloxacin Hcl In Nacl] Other (See Comments)    unk  . Alendronate Sodium Nausea Only    HOME MEDICATIONS:  Current Outpatient Medications:  .  acetaminophen (TYLENOL) 500 MG tablet, Take 500 mg by mouth 2 (two) times daily. , Disp: , Rfl:  .  Alpha-Lipoic Acid (LIPOIC ACID PO), Take 2 tablets by mouth 2 (two) times daily. , Disp: , Rfl:  .  atorvastatin (LIPITOR) 10 MG tablet, TAKE 1 TABLET BY MOUTH EVERY DAY, Disp: 90 tablet, Rfl: 3 .  Calcium Carbonate-Vitamin D3 600-400 MG-UNIT TABS, Take 1 tablet by mouth 2 (two) times a day. , Disp: , Rfl:  .  cholecalciferol (VITAMIN D) 1000 UNITS tablet, Take 1,000 Units by mouth daily., Disp: , Rfl:  .  desmopressin (DDAVP) 0.2 MG tablet, Take 3 tablets by mouth at bedtime., Disp: , Rfl:  .  diltiazem (CARDIZEM CD) 120 MG 24 hr capsule, TAKE 1 CAPSULE BY MOUTH DAILY *NEED OFFICE VISIT FOR  REFILLS*, Disp: 30 capsule, Rfl: 5 .  DULoxetine (CYMBALTA) 30 MG capsule, TAKE 1 CAPSULE BY MOUTH TWICE A DAY, Disp: 180 capsule, Rfl: 3 .  ELIQUIS 5 MG TABS tablet, TAKE 1 TABLET BY MOUTH TWICE A DAY, Disp: 60 tablet, Rfl: 5 .  fluticasone (FLONASE) 50 MCG/ACT nasal spray, Place into the nose., Disp: , Rfl:  .  GLUCOSAMINE-CHONDROITIN PO, Take 2 tablets by mouth daily with lunch. Glucosamine 1500mg  Chondroitin 1200mg , Disp: , Rfl:  .  ibuprofen (ADVIL) 200 MG tablet, Take 200 mg by mouth every 6 (six) hours as needed., Disp: , Rfl:  .  Lutein-Zeaxanthin 25-5 MG CAPS, Take 1 tablet by mouth daily., Disp: , Rfl:  .  meclizine (ANTIVERT) 25 MG tablet, Take 1 tablet by mouth as needed., Disp: , Rfl:  .  Melatonin 3 MG TABS, Take 3 mg by mouth at bedtime., Disp: , Rfl:  .  Multiple Vitamin (MULTIVITAMIN WITH MINERALS) TABS tablet, Take 1 tablet by mouth daily., Disp: , Rfl:  .  nitroGLYCERIN (NITROLINGUAL) 0.4 MG/SPRAY spray, Place 1 spray under the tongue every 5 (five) minutes x 3 doses as needed for chest pain., Disp: , Rfl:  .  Omega-3 1000 MG CAPS, Take 1,000 mg by mouth daily., Disp: , Rfl:  .  pantoprazole (PROTONIX) 40 MG tablet, Take 40 mg by mouth daily before breakfast. , Disp: , Rfl: 1 .  sertraline (ZOLOFT) 25 MG tablet, Take 25 mg by mouth daily., Disp: , Rfl:  .  sucralfate (CARAFATE) 1 g tablet, Take 1 g by mouth 3 (three) times daily. , Disp: , Rfl:  .  thyroid (ARMOUR) 60 MG tablet, Take 60 mg by mouth daily before breakfast., Disp: , Rfl:  .  tiZANidine (ZANAFLEX) 4 MG tablet, Take 1 tablet (4 mg total) by mouth 3 (three) times daily as needed., Disp: 90 tablet, Rfl: 5 .  traMADol (ULTRAM) 50 MG tablet, Take 1 tablet by mouth every 6 (six) hours as needed., Disp: , Rfl:  .  vitamin C (ASCORBIC ACID) 500 MG tablet, Take 1,000 mg by mouth daily. , Disp: , Rfl:   PAST MEDICAL HISTORY: Past Medical History:  Diagnosis Date  . Acute blood loss anemia 10/27/2017  . Acute GI bleeding  10/27/2017  . Anemia   . Arthritis    "joints" (11/10/2017)  . Dizziness   . GERD (gastroesophageal reflux disease)   . GI bleed 11/06/2017  . Hearing loss   . History of blood transfusion 10/2017  . Hyperlipidemia   . Hyperthyroidism   . Melanoma ([place])    "cut off my back"  . Memory loss   . Sinus headache   . Sleep apnea     PAST SURGICAL HISTORY: Past Surgical History:  Procedure Laterality Date  . ABDOMINAL HYSTERECTOMY     "  partial"  . APPENDECTOMY    . BIOPSY  11/17/2017   Procedure: BIOPSY;  Surgeon: Clarene Essex, MD;  Location: [facility];  Service: Endoscopy;;  . COLONOSCOPY WITH PROPOFOL N/A 11/17/2017   Procedure: COLONOSCOPY WITH PROPOFOL;  Surgeon: Clarene Essex, MD;  Location: [facility];  Service: Endoscopy;  Laterality: N/A;  . CRANIECTOMY FOR EXCISION OF ACOUSTIC NEUROMA    . ESOPHAGOGASTRODUODENOSCOPY (EGD) WITH PROPOFOL N/A 10/28/2017   Procedure: ESOPHAGOGASTRODUODENOSCOPY (EGD) WITH PROPOFOL;  Surgeon: Clarene Essex, MD;  Location: [facility];  Service: Endoscopy;  Laterality: N/A;  . KNEE ARTHROSCOPY Left   . LEFT HEART CATH AND CORONARY ANGIOGRAPHY N/A 11/24/2016   Procedure: LEFT HEART CATH AND CORONARY ANGIOGRAPHY;  Surgeon: Jettie Booze, MD;  Location: [place] [facility];  Service: Cardiovascular;  Laterality: N/A;  . MELANOMA EXCISION     "off my back"  . NASAL SINUS SURGERY    . TONSILLECTOMY      FAMILY HISTORY: Family History  Problem Relation Age of Onset  . Congestive Heart Failure Mother   . Heart disease Father        History of heart attacks at a later age.    Marland Kitchen Heart disease Brother   . Hearing loss Brother     SOCIAL HISTORY:  Social History   Socioeconomic History  . Marital status: Married    Spouse name: Not on file  . Number of children: 2  . Years of education: Not on file  . Highest education level: Not on file  Occupational History  . Not on file  Tobacco Use  . Smoking status: Former Smoker    Years: 2.00     Types: Cigarettes    Quit date: 1980    Years since quitting: 41.8  . Smokeless tobacco: Never Used  . Tobacco comment: 11/10/2017 "only smoked when I was on my period"  Vaping Use  . Vaping Use: Never used  Substance and Sexual Activity  . Alcohol use: Yes    Comment: 11/10/2017 "nothing lately; did have 1 glass of wine q night"  . Drug use: Never  . Sexual activity: Not Currently  Other Topics Concern  . Not on file  Social History Narrative   Right handed    Social Determinants of Health   Financial Resource Strain:   . Difficulty of Paying Living Expenses: Not on file  Food Insecurity:   . Worried About Charity fundraiser in the Last Year: Not on file  . Ran Out of Food in the Last Year: Not on file  Transportation Needs:   . Lack of Transportation (Medical): Not on file  . Lack of Transportation (Non-Medical): Not on file  Physical Activity:   . Days of Exercise per Week: Not on file  . Minutes of Exercise per Session: Not on file  Stress:   . Feeling of Stress : Not on file  Social Connections:   . Frequency of Communication with Friends and Family: Not on file  . Frequency of Social Gatherings with Friends and Family: Not on file  . Attends Religious Services: Not on file  . Active Member of Clubs or Organizations: Not on file  . Attends Archivist Meetings: Not on file  . Marital Status: Not on file  Intimate Partner Violence:   . Fear of Current or Ex-Partner: Not on file  . Emotionally Abused: Not on file  . Physically Abused: Not on file  . Sexually Abused: Not on file  PHYSICAL EXAM  Vitals:   01/29/20 1319  BP: 133/79  Pulse: 78  Weight: 148 lb 8 oz (67.4 kg)  Height: 5\' 5"  (1.651 m)    Body mass index is 24.71 kg/m.   General: The patient is well-developed and well-nourished and in no acute distress  Neck:   She has occipital tenderness with a mildly reduced range of motion of the neck.  Skin: Extremities are without  significant edema, rash.    Musculoskeletal: She has moderate tenderness over the right splenius capitis.  Neurologic Exam  Mental status: The patient is alert and oriented x 2 1/2 (wrong date) at the time of the examination.  See MMSE/MoCA scores above for details.Marland Kitchen   Speech is normal.  Cranial nerves: Extraocular movements are full.   Facial strength and sensation is normal.  Trapezius strength is normal.   Reduced hearing on the left.   Motor:  Muscle bulk is normal.   Tone is normal. Strength is  5 / 5 in all 4 extremities.   Sensory: Sensory testing is intact to soft touch in the arms but reduced sensation to vibration in the toes.  Fairly normal sensation at the ankles.  Coordination: Cerebellar testing shows good finger-nose-finger and heel-to-shin   Gait and station: Station is normal.   Her gait is slightly wide.  Tandem gait is wide..    ASSESSMENT AND PLAN  Other headache syndrome  Neck pain  OSA on CPAP  Polyneuropathy  Memory loss  Gait disturbance   1.   Right splenius capitus injection with 60 mg Depo-Medrol in [place] using sterile technique.   Pain improved afterwards,.   2,   Ventricles are mildly enlarged out of proportion to the atrophy - could be incidental though NPH is possible.  If gait and bladder worsen, consider a high volume LP.   3.   Continue APAP nightly.   She has some sleepiness.    If this worsens consider a low-dose of stimulant or if she also has sleepiness of wake promoting agent such as Nuvigil.     4.  continue alpha lipoic acid 600-800 mg daily for neuropathy 5.   rtc 6 months for scheduled visit if stable.    Luciel Brickman A. Felecia Shelling, MD, PhD 98/33/8250, 5:39 PM Certified in Neurology, Clinical Neurophysiology, Sleep Medicine, Pain Medicine and Neuroimaging  [facility] [address] [place], [place] [address] [phone]

## 2020-01-30 ENCOUNTER — Other Ambulatory Visit: Payer: Medicare PPO

## 2020-01-31 ENCOUNTER — Other Ambulatory Visit: Payer: Self-pay

## 2020-01-31 ENCOUNTER — Ambulatory Visit (INDEPENDENT_AMBULATORY_CARE_PROVIDER_SITE_OTHER): Payer: Medicare PPO | Admitting: Podiatry

## 2020-01-31 ENCOUNTER — Ambulatory Visit: Payer: Self-pay

## 2020-01-31 ENCOUNTER — Ambulatory Visit (INDEPENDENT_AMBULATORY_CARE_PROVIDER_SITE_OTHER): Payer: Medicare PPO | Admitting: Orthopedic Surgery

## 2020-01-31 ENCOUNTER — Ambulatory Visit (INDEPENDENT_AMBULATORY_CARE_PROVIDER_SITE_OTHER): Payer: Medicare PPO

## 2020-01-31 DIAGNOSIS — M25562 Pain in left knee: Secondary | ICD-10-CM

## 2020-01-31 DIAGNOSIS — R2681 Unsteadiness on feet: Secondary | ICD-10-CM

## 2020-01-31 DIAGNOSIS — Q666 Other congenital valgus deformities of feet: Secondary | ICD-10-CM

## 2020-01-31 DIAGNOSIS — M25561 Pain in right knee: Secondary | ICD-10-CM

## 2020-01-31 MED ORDER — DILTIAZEM HCL ER COATED BEADS 120 MG PO CP24
120.0000 mg | ORAL_CAPSULE | Freq: Every day | ORAL | 0 refills | Status: DC
Start: 2020-01-31 — End: 2020-02-22

## 2020-02-01 ENCOUNTER — Encounter: Payer: Self-pay | Admitting: Podiatry

## 2020-02-01 NOTE — Progress Notes (Signed)
Subjective:  [name], female    DOB: 08-07-1940,  MRN: [id number]  Chief Complaint  Patient presents with  . routine foot care    calous     79 y.o. female presents with the above complaint.  Patient presents with bilateral midfoot hyperkeratotic lesion follow-up.  She is doing a little bit better with accommodative inserts.  She was not able to tolerate the miso braces.  She denies any other acute complaints  Review of Systems: Negative except as noted in the HPI. Denies N/V/F/Ch.  Past Medical History:  Diagnosis Date  . Acute blood loss anemia 10/27/2017  . Acute GI bleeding 10/27/2017  . Anemia   . Arthritis    "joints" (11/10/2017)  . Dizziness   . GERD (gastroesophageal reflux disease)   . GI bleed 11/06/2017  . Hearing loss   . History of blood transfusion 10/2017  . Hyperlipidemia   . Hyperthyroidism   . Melanoma ([place])    "cut off my back"  . Memory loss   . Sinus headache   . Sleep apnea     Current Outpatient Medications:  .  acetaminophen (TYLENOL) 500 MG tablet, Take 500 mg by mouth 2 (two) times daily. , Disp: , Rfl:  .  Alpha-Lipoic Acid (LIPOIC ACID PO), Take 2 tablets by mouth 2 (two) times daily. , Disp: , Rfl:  .  amoxicillin (AMOXIL) 500 MG capsule, amoxicillin 500 mg capsule  TAKE 1 CAPSULE BY MOUTH EVERY 12 HOURS FOR 7 DAYS, Disp: , Rfl:  .  atorvastatin (LIPITOR) 10 MG tablet, TAKE 1 TABLET BY MOUTH EVERY DAY, Disp: 90 tablet, Rfl: 3 .  Calcium Carbonate-Vitamin D3 600-400 MG-UNIT TABS, Take 1 tablet by mouth 2 (two) times a day. , Disp: , Rfl:  .  cefUROXime (CEFTIN) 250 MG tablet, cefuroxime axetil 250 mg tablet  TAKE 1 TABLET BY MOUTH EVERY 12 HOURS FOR 7 DAYS, Disp: , Rfl:  .  cefUROXime (CEFTIN) 500 MG tablet, cefuroxime axetil 500 mg tablet  TAKE 1 TABLET BY MOUTH EVERY 12 HOURS FOR 10 DAYS, Disp: , Rfl:  .  cholecalciferol (VITAMIN D) 1000 UNITS tablet, Take 1,000 Units by mouth daily., Disp: , Rfl:  .  cycloSPORINE (RESTASIS)  0.05 % ophthalmic emulsion, Restasis 0.05 % eye drops in a dropperette  PLACE 1 DROP IN AFFECTED EYE TWICE A DAY, Disp: , Rfl:  .  desmopressin (DDAVP) 0.2 MG tablet, Take 3 tablets by mouth at bedtime., Disp: , Rfl:  .  diltiazem (CARDIZEM CD) 120 MG 24 hr capsule, Take 1 capsule (120 mg total) by mouth daily., Disp: 30 capsule, Rfl: 0 .  doxycycline (VIBRAMYCIN) 100 MG capsule, doxycycline hyclate 100 mg capsule  TAKE 1 CAPSULE BY MOUTH TWICE A DAY FOR 7 DAYS, Disp: , Rfl:  .  DULoxetine (CYMBALTA) 30 MG capsule, TAKE 1 CAPSULE BY MOUTH TWICE A DAY, Disp: 180 capsule, Rfl: 3 .  ELIQUIS 5 MG TABS tablet, TAKE 1 TABLET BY MOUTH TWICE A DAY, Disp: 60 tablet, Rfl: 5 .  fluticasone (FLONASE) 50 MCG/ACT nasal spray, Place into the nose., Disp: , Rfl:  .  GLUCOSAMINE-CHONDROITIN PO, Take 2 tablets by mouth daily with lunch. Glucosamine 1500mg  Chondroitin 1200mg , Disp: , Rfl:  .  ibuprofen (ADVIL) 200 MG tablet, Take 200 mg by mouth every 6 (six) hours as needed., Disp: , Rfl:  .  Lutein-Zeaxanthin 25-5 MG CAPS, Take 1 tablet by mouth daily., Disp: , Rfl:  .  meclizine (ANTIVERT) 25  MG tablet, Take 1 tablet by mouth as needed., Disp: , Rfl:  .  Melatonin 3 MG TABS, Take 3 mg by mouth at bedtime., Disp: , Rfl:  .  mirabegron ER (MYRBETRIQ) 50 MG TB24 tablet, Myrbetriq 50 mg tablet,extended release  TAKE 1 TABLET BY MOUTH EVERY DAY, Disp: , Rfl:  .  Multiple Vitamin (MULTIVITAMIN WITH MINERALS) TABS tablet, Take 1 tablet by mouth daily., Disp: , Rfl:  .  nitrofurantoin (MACRODANTIN) 50 MG capsule, Take 50 mg by mouth daily., Disp: , Rfl:  .  nitroGLYCERIN (NITROLINGUAL) 0.4 MG/SPRAY spray, Place 1 spray under the tongue every 5 (five) minutes x 3 doses as needed for chest pain., Disp: , Rfl:  .  Omega-3 1000 MG CAPS, Take 1,000 mg by mouth daily., Disp: , Rfl:  .  pantoprazole (PROTONIX) 40 MG tablet, Take 40 mg by mouth daily before breakfast. , Disp: , Rfl: 1 .  predniSONE (STERAPRED UNI-PAK 21 TAB) 10  MG (21) TBPK tablet, Take by mouth as directed., Disp: , Rfl:  .  sertraline (ZOLOFT) 25 MG tablet, Take 25 mg by mouth daily., Disp: , Rfl:  .  sucralfate (CARAFATE) 1 g tablet, Take 1 g by mouth 3 (three) times daily. , Disp: , Rfl:  .  sulfamethoxazole-trimethoprim (BACTRIM DS) 800-160 MG tablet, sulfamethoxazole 800 mg-trimethoprim 160 mg tablet, Disp: , Rfl:  .  thyroid (ARMOUR) 60 MG tablet, Take 60 mg by mouth daily before breakfast., Disp: , Rfl:  .  tiZANidine (ZANAFLEX) 4 MG tablet, Take 1 tablet (4 mg total) by mouth 3 (three) times daily as needed., Disp: 90 tablet, Rfl: 5 .  traMADol (ULTRAM) 50 MG tablet, Take 1 tablet by mouth every 6 (six) hours as needed., Disp: , Rfl:  .  trimethoprim (TRIMPEX) 100 MG tablet, trimethoprim 100 mg tablet  TAKE 1 TABLET BY MOUTH TWICE A DAY FOR 7 DAYS, Disp: , Rfl:  .  vitamin C (ASCORBIC ACID) 500 MG tablet, Take 1,000 mg by mouth daily. , Disp: , Rfl:   Social History   Tobacco Use  Smoking Status Former Smoker  . Years: 2.00  . Types: Cigarettes  . Quit date: 34  . Years since quitting: 41.8  Smokeless Tobacco Never Used  Tobacco Comment   11/10/2017 "only smoked when I was on my period"    Allergies  Allergen Reactions  . Avelox [Moxifloxacin Hcl In Nacl] Other (See Comments)    unk  . Alendronate Sodium Nausea Only   Objective:  There were no vitals filed for this visit. There is no height or weight on file to calculate BMI. Constitutional Well developed. Well nourished.  Vascular Dorsalis pedis pulses palpable bilaterally. Posterior tibial pulses palpable bilaterally. Capillary refill normal to all digits.  No cyanosis or clubbing noted. Pedal hair growth normal.  Neurologic Normal speech. Oriented to person, place, and time. Epicritic sensation to light touch grossly present bilaterally.  Dermatologic  thickened elongated dystrophic toenails x10.  Mild pain on palpation. No open wounds. No skin lesions.  Orthopedic:   Pain on palpation to the bilateral midfoot hyperkeratotic lesion.  The lesion appeared to be due to underlying pes planus deformity with likely fat pad atrophy leading to aggressive pressure.  No open wounds or ulcers noted.  No clinical signs of infection noted.   Radiographs: None Assessment:   1. Pes planovalgus   2. Gait instability    Plan:  Patient was evaluated and treated and all questions answered.  Bilateral midfoot benign  skin lesion with underlying pes planovalgus deformity with medial column collapse -She was not able to tolerate the [place] braces.  Ultimately rec discussed with her the reason for not being able to tolerate that has to do with the underlying foot deformity that she has going on.  She did states that she was able to benefit more from accommodative inserts that are usually made for diabetic shoes.  Given that she is not a diabetic she does not qualify for the diabetic shoes/insoles.  However rec had given her a free pair of accommodative inserts which seems to be helping really well.  I discussed with her that the start wearing out she can see [place] back again and we can see if there is any more that accommodative inserts. -If there is continuous pain I will discuss with the patient the possible exostectomy to take the pressure off of the midfoot lesions in the near future if there is no resolve meant in pain.  However for now we will discuss continued conservative treatment options.   Boneta Lucks, DPM    No follow-ups on file.     No follow-ups on file.

## 2020-02-02 ENCOUNTER — Ambulatory Visit: Payer: Medicare PPO | Admitting: Cardiology

## 2020-02-02 ENCOUNTER — Encounter: Payer: Self-pay | Admitting: Cardiology

## 2020-02-02 ENCOUNTER — Other Ambulatory Visit: Payer: Self-pay

## 2020-02-02 VITALS — BP 124/74 | HR 95 | Resp 16 | Ht 65.0 in | Wt 145.0 lb

## 2020-02-02 DIAGNOSIS — I480 Paroxysmal atrial fibrillation: Secondary | ICD-10-CM | POA: Diagnosis not present

## 2020-02-02 DIAGNOSIS — N390 Urinary tract infection, site not specified: Secondary | ICD-10-CM

## 2020-02-02 DIAGNOSIS — I10 Essential (primary) hypertension: Secondary | ICD-10-CM | POA: Diagnosis not present

## 2020-02-02 DIAGNOSIS — I951 Orthostatic hypotension: Secondary | ICD-10-CM

## 2020-02-02 DIAGNOSIS — R0789 Other chest pain: Secondary | ICD-10-CM | POA: Diagnosis not present

## 2020-02-02 MED ORDER — NITROGLYCERIN 0.4 MG/SPRAY TL SOLN: 1.0000 | 1 refills | Status: DC | PRN

## 2020-02-02 NOTE — Progress Notes (Signed)
Primary Physician/Referring:  Deland Pretty, MD  [name], female    DOB: June 12, 1940, 79 y.o.   MRN: [id number]  Chief Complaint  Patient presents with  . Atrial Fibrillation  . Follow-up   HPI:    Cynthia [name]  is a 79 y.o. Caucasian female with hypertension, hyperlipidemia, frequent UTI,  paroxysmal atrial fibrillation, atrial tachycardia, obstructive sleep apnea on CPAP. Tolerating anticoagulation well. Flecainide discontinued due to dizziness. No recurrence of AF.   In the past she has had recurrent episodes of near syncope and severe orthostatic changes with recurrent UTI.  Over the past 1 month or so she has not had any significant dizziness or lightheadedness. She is being evaluated for hydrocephalus.  She is feeling the best she has in quite a while.  Past Medical History:  Diagnosis Date  . Acute blood loss anemia 10/27/2017  . Acute GI bleeding 10/27/2017  . Anemia   . Arthritis    "joints" (11/10/2017)  . Dizziness   . GERD (gastroesophageal reflux disease)   . GI bleed 11/06/2017  . Hearing loss   . History of blood transfusion 10/2017  . Hyperlipidemia   . Hyperthyroidism   . Melanoma ([place])    "cut off my back"  . Memory loss   . Sinus headache   . Sleep apnea    Past Surgical History:  Procedure Laterality Date  . ABDOMINAL HYSTERECTOMY     "partial"  . APPENDECTOMY    . BIOPSY  11/17/2017   Procedure: BIOPSY;  Surgeon: Clarene Essex, MD;  Location: [facility];  Service: Endoscopy;;  . COLONOSCOPY WITH PROPOFOL N/A 11/17/2017   Procedure: COLONOSCOPY WITH PROPOFOL;  Surgeon: Clarene Essex, MD;  Location: [facility];  Service: Endoscopy;  Laterality: N/A;  . CRANIECTOMY FOR EXCISION OF ACOUSTIC NEUROMA    . ESOPHAGOGASTRODUODENOSCOPY (EGD) WITH PROPOFOL N/A 10/28/2017   Procedure: ESOPHAGOGASTRODUODENOSCOPY (EGD) WITH PROPOFOL;  Surgeon: Clarene Essex, MD;  [facility];  Service: Endoscopy;  Laterality: N/A;  . KNEE ARTHROSCOPY Left    . LEFT HEART CATH AND CORONARY ANGIOGRAPHY N/A 11/24/2016   Procedure: LEFT HEART CATH AND CORONARY ANGIOGRAPHY;  Surgeon: Jettie Booze, MD;  Location: [facility];  Service: Cardiovascular;  Laterality: N/A;  . MELANOMA EXCISION     "off my back"  . NASAL SINUS SURGERY    . TONSILLECTOMY     Family History  Problem Relation Age of Onset  . Congestive Heart Failure Mother   . Heart disease Father        History of heart attacks at a later age.    Marland Kitchen Heart disease Brother   . Hearing loss Brother     Social History   Tobacco Use  . Smoking status: Former Smoker    Years: 2.00    Types: Cigarettes    Quit date: 1980    Years since quitting: 41.8  . Smokeless tobacco: Never Used  . Tobacco comment: 11/10/2017 "only smoked when I was on my period"  Substance Use Topics  . Alcohol use: Yes    Comment: 11/10/2017 "nothing lately; did have 1 glass of wine q night"  Marital Status: Married   ROS  Review of Systems  Cardiovascular: Negative for chest pain, dyspnea on exertion and leg swelling.  Gastrointestinal: Negative for melena.  Neurological: Positive for dizziness and light-headedness.   Objective  Blood pressure 124/74, pulse 95, resp. rate 16, height '5\' 5"'  (1.651 m), weight 145 lb (65.8 kg),  SpO2 93 %.  Vitals with BMI 02/02/2020 01/29/2020 01/24/2020  Height '5\' 5"'  '5\' 5"'  -  Weight 145 lbs 148 lbs 8 oz -  BMI 31.51 76.16 -  Systolic 073 710 626  Diastolic 74 79 66  Pulse 95 78 79    Orthostatic VS for the past 72 hrs (Last 3 readings):  Patient Position BP Location Cuff Size  02/02/20 1302 Sitting Left Arm Normal     Physical Exam Cardiovascular:     Rate and Rhythm: Normal rate and regular rhythm.     Pulses: Intact distal pulses.     Heart sounds: Normal heart sounds. No murmur heard.  No gallop.      Comments: No leg edema, no JVD. Pulmonary:     Effort: Pulmonary effort is normal.     Breath sounds: Normal breath sounds.  Abdominal:      General: Bowel sounds are normal.     Palpations: Abdomen is soft.    Laboratory examination:   Recent Labs    06/30/19 1740 01/24/20 1159  NA 136 135  K 3.2* 3.5  CL 101 100  CO2 25 26  GLUCOSE 118* 97  BUN 16 25*  CREATININE 0.67 0.58  CALCIUM 8.9 9.0  GFRNONAA >60 >60  GFRAA >60  --    estimated creatinine clearance is 53 mL/min (by C-G formula based on SCr of 0.58 mg/dL).  CMP Latest Ref Rng & Units 01/24/2020 12/13/2019 06/30/2019  Glucose 70 - 99 mg/dL 97 - 118(H)  BUN 8 - 23 mg/dL 25(H) - 16  Creatinine 0.44 - 1.00 mg/dL 0.58 - 0.67  Sodium 135 - 145 mmol/L 135 - 136  Potassium 3.5 - 5.1 mmol/L 3.5 - 3.2(L)  Chloride 98 - 111 mmol/L 100 - 101  CO2 22 - 32 mmol/L 26 - 25  Calcium 8.9 - 10.3 mg/dL 9.0 - 8.9  Total Protein 6.0 - 8.5 g/dL - 6.2 -  Total Bilirubin 0.3 - 1.2 mg/dL - - -  Alkaline Phos 38 - 126 U/L - - -  AST 15 - 41 U/L - - -  ALT 0 - 44 U/L - - -   CBC Latest Ref Rng & Units 01/24/2020 06/30/2019 12/14/2018  WBC 4.0 - 10.5 K/uL 6.4 11.0(H) 5.6  Hemoglobin 12.0 - 15.0 g/dL 10.9(L) 10.6(L) 9.3(L)  Hematocrit 36 - 46 % 32.8(L) 33.7(L) 29.4(L)  Platelets 150 - 400 K/uL 197 233 188   Lipid Panel     Component Value Date/Time   CHOL 165 11/24/2016 0040   TRIG 88 11/24/2016 0040   HDL 61 11/24/2016 0040   CHOLHDL 2.7 11/24/2016 0040   VLDL 18 11/24/2016 0040   LDLCALC 86 11/24/2016 0040   HEMOGLOBIN A1C Lab Results  Component Value Date   HGBA1C 5.6 11/24/2016   MPG 114 11/24/2016   TSH No results for input(s): TSH in the last 8760 hours.  External labs:   Cholesterol, total 145.000 m 09/01/2019 HDL 67.000 mg 10/13/2019 LDL-C 94.000 cal 10/13/2019 Triglycerides 80.000 mg 10/13/2019  Hemoglobin 10.900 g/d 01/24/2020 INR 1.160 11/06/2017 Platelets 197.000 K/ 01/24/2020  Creatinine, Serum 0.580 mg/ 01/24/2020 Potassium 3.500 mm 01/24/2020 ALT (SGPT) 15.000 IU/ 09/01/2019  TSH 0.870 10/13/2019  Hemoglobin 9.300 12/14/2018;  INR 1.160  11/06/2017 Platelets 188.000 12/14/2018  Creatinine, Serum 0.970 12/14/2018 Potassium 4.000 12/14/2018 Magnesium N/D ALT (SGPT) 15.000 12/14/2018  Labs 11/02/2018: BUN 26, creatinine 0.9, EGFR greater than 60 mL, Hb 10.6/HCT 32.2, platelets 244.   Medications and allergies  Allergies  Allergen Reactions  . Avelox [Moxifloxacin Hcl In Nacl] Other (See Comments)    unk  . Alendronate Sodium Nausea Only    Current Outpatient Medications on File Prior to Visit  Medication Sig Dispense Refill  . acetaminophen (TYLENOL) 500 MG tablet Take 500 mg by mouth 2 (two) times daily.     . Alpha-Lipoic Acid (LIPOIC ACID PO) Take 2 tablets by mouth 2 (two) times daily.     Marland Kitchen atorvastatin (LIPITOR) 10 MG tablet TAKE 1 TABLET BY MOUTH EVERY DAY 90 tablet 3  . Calcium Carbonate-Vitamin D3 600-400 MG-UNIT TABS Take 1 tablet by mouth 2 (two) times a day.     . cholecalciferol (VITAMIN D) 1000 UNITS tablet Take 1,000 Units by mouth daily.    . cycloSPORINE (RESTASIS) 0.05 % ophthalmic emulsion Restasis 0.05 % eye drops in a dropperette  PLACE 1 DROP IN AFFECTED EYE TWICE A DAY    . desmopressin (DDAVP) 0.2 MG tablet Take 3 tablets by mouth at bedtime.    Marland Kitchen diltiazem (CARDIZEM CD) 120 MG 24 hr capsule Take 1 capsule (120 mg total) by mouth daily. (Patient taking differently: Take 120 mg by mouth every other day. ) 30 capsule 0  . DULoxetine (CYMBALTA) 30 MG capsule TAKE 1 CAPSULE BY MOUTH TWICE A DAY 180 capsule 3  . ELIQUIS 5 MG TABS tablet TAKE 1 TABLET BY MOUTH TWICE A DAY 60 tablet 5  . fluticasone (FLONASE) 50 MCG/ACT nasal spray Place into the nose.    Marland Kitchen GLUCOSAMINE-CHONDROITIN PO Take 2 tablets by mouth daily with lunch. Glucosamine 1512m Chondroitin 1202m   . ibuprofen (ADVIL) 200 MG tablet Take 200 mg by mouth every 6 (six) hours as needed.    . Lutein-Zeaxanthin 25-5 MG CAPS Take 1 tablet by mouth daily.    . meclizine (ANTIVERT) 25 MG tablet Take 1 tablet by mouth as needed.    . Melatonin 3 MG  TABS Take 3 mg by mouth at bedtime.    . Multiple Vitamin (MULTIVITAMIN WITH MINERALS) TABS tablet Take 1 tablet by mouth daily.    . Omega-3 1000 MG CAPS Take 1,000 mg by mouth daily.    . pantoprazole (PROTONIX) 40 MG tablet Take 40 mg by mouth daily before breakfast.   1  . sertraline (ZOLOFT) 25 MG tablet Take 25 mg by mouth daily.    . sucralfate (CARAFATE) 1 g tablet Take 1 g by mouth 3 (three) times daily.     . Marland Kitchenhyroid (ARMOUR) 60 MG tablet Take 60 mg by mouth daily before breakfast.    . tiZANidine (ZANAFLEX) 4 MG tablet Take 1 tablet (4 mg total) by mouth 3 (three) times daily as needed. 90 tablet 5  . traMADol (ULTRAM) 50 MG tablet Take 1 tablet by mouth every 6 (six) hours as needed.    . vitamin C (ASCORBIC ACID) 500 MG tablet Take 1,000 mg by mouth daily.     . Marland Kitchenmoxicillin (AMOXIL) 500 MG capsule amoxicillin 500 mg capsule  TAKE 1 CAPSULE BY MOUTH EVERY 12 HOURS FOR 7 DAYS (Patient not taking: Reported on 02/02/2020)     No current facility-administered medications on file prior to visit.    Radiology:   No results found.  Cardiac Studies:   Event Monitor for 30 days Start date 07/01/2018 for syncope : A. Fib one episode, no symptoms reported. No heart block or significant bradycardia  Carotid duplex 03/27/2018:  Right Carotid: Velocities in the right ICA are consistent  with a 1-39% stenosis. Left Carotid: Velocities in the left ICA are consistent with a 1-39% stenosis. Vertebrals:  Bilateral vertebral arteries demonstrate antegrade flow. Subclavians: Normal flow hemodynamics were seen in bilateral subclavian arteries.   Event Monitor 30 days 11/11/2017: Predominant rhythm is normal sinus rhythm. Symptoms of fatigue reveals normal sinus rhythm. Asymptomatic atrial fibrillation and probable atypical atrial flutter noted on 11/13/2017, 11/18/2017 and 12/09/2017 at 3:00 AM. Occasional PACs. Arrhythmia/PVC burden 6%.   Echo 11/09/2017: Left ventricle: The cavity size was normal.  Systolic function was   normal. The estimated ejection fraction was in the range of 60% to 65%. Wall motion was normal; there were no regional wall   motion abnormalities. The study is not technically sufficient to   allow evaluation of LV diastolic function.Pulmonary arteries: PA peak pressure: 32 mm Hg (S).   Coronary angiogram 11/24/2016:  Normal coronaries; tortuous LAD  EKG:   EKG 02/02/2020: Sinus tachycardia at the rate of 103 bpm, left axis deviation, left anterior fascicular block.  Incomplete right bundle branch block.  Poor R wave progression, cannot exclude anteroseptal infarct old.  Nonspecific T abnormality.  No significant change from 06/12/2019.  EKG 06/20/2018: Atrial tachycardia at the rate of 133 bpm.  Left axis deviation, left anterior fascicular block.  Poor R-wave progression.  Incomplete right bundle branch block.  Assessment     ICD-10-CM   1. Paroxysmal atrial fibrillation ([place]). CHA2DS2-VASc Score is 4.  Yearly risk of stroke: 4% (A, F, HTN,).     I48.0 EKG 12-Lead  2. Orthostatic hypotension  I95.1   3. Essential hypertension, benign  I10   4. Recurrent UTI  N39.0 Urinalysis, Routine w reflex microscopic  5. Atypical chest pain  R07.89 nitroGLYCERIN (NITROLINGUAL) 0.4 MG/SPRAY spray    Meds ordered this encounter  Medications  . nitroGLYCERIN (NITROLINGUAL) 0.4 MG/SPRAY spray    Sig: Place 1 spray under the tongue every 5 (five) minutes x 3 doses as needed for chest pain.    Dispense:  12 g    Refill:  1    Medications Discontinued During This Encounter  Medication Reason  . cefUROXime (CEFTIN) 250 MG tablet Completed Course  . cefUROXime (CEFTIN) 505 MG tablet Duplicate  . doxycycline (VIBRAMYCIN) 100 MG capsule Completed Course  . nitrofurantoin (MACRODANTIN) 50 MG capsule Completed Course  . predniSONE (STERAPRED UNI-PAK 21 TAB) 10 MG (21) TBPK tablet Completed Course  . sulfamethoxazole-trimethoprim (BACTRIM DS) 800-160 MG tablet Completed Course  .  trimethoprim (TRIMPEX) 100 MG tablet Completed Course  . mirabegron ER (MYRBETRIQ) 50 MG TB24 tablet No longer needed (for PRN medications)  . nitroGLYCERIN (NITROLINGUAL) 0.4 MG/SPRAY spray Reorder     Recommendations:   Jocelyne Reinertsen  is a 79 y.o. Caucasian female with hypertension, hyperlipidemia, frequent UTI,  paroxysmal atrial fibrillation, atrial tachycardia, obstructive sleep apnea on CPAP. Tolerating anticoagulation well. Flecainide discontinued due to dizziness. No recurrence of AF.   In the past she has had recurrent episodes of near syncope and severe orthostatic changes with recurrent UTI.  Over the past 1 month or so she has not had any significant dizziness or lightheadedness. She is being evaluated for hydrocephalus.  From cardiac standpoint she is presently doing well and is maintaining sinus rhythm.  EKG essentially unremarkable and normal.  She will continue with present medications.  Blood pressure is also well controlled.  As she is feeling the best she has in quite a while, I have recommended that we obtain a urine analysis to  establish a baseline as there has been some questions whether she truly has UTI or she has chronic bacteriuria.  Labs reviewed. She has chronic anemia and Hb has remained stable.  Lipids are normal.  I will see her back in 6 months for follow-up.   Adrian Prows, MD, [facility] 02/02/2020, 1:53 PM Office: [phone] Pager: [phone]

## 2020-02-07 ENCOUNTER — Encounter: Payer: Self-pay | Admitting: Orthopedic Surgery

## 2020-02-07 NOTE — Progress Notes (Signed)
Office Visit Note   Patient: Cynthia [name]           Date of Birth: 03-Mar-1941           MRN: [id number] Visit Date: 01/31/2020 Requested by: Deland Pretty, MD [address],  [place] [id number] PCP: Deland Pretty, MD  Subjective: Chief Complaint  Patient presents with  . Right Knee - Pain  . Left Knee - Pain    HPI: Cynthia [name] is a 79 year old patient with bilateral knee pain right worse than left.  Reports pain with ambulation but pain that does not wake her from sleep at night.  Pain comes and goes.  The patient states that today that her pain is not bothering her.  She describes decreased walking endurance.  1 shot lasted about 3 months.  She also reports a lot of foot pain due to a callus.  She is being treated by podiatrist for that problem.              ROS: All systems reviewed are negative as they relate to the chief complaint within the history of present illness.  Patient denies  fevers or chills.   Assessment & Plan: Visit Diagnoses:  1. Pain in both knees, unspecified chronicity     Plan: Impression is severe end-stage bilateral knee arthritis which look pretty symmetric on radiographs.  I think injections would be the way to go but currently she is not symptomatic.  She would like to come back in about 3 months for a repeat injection which I think is reasonable.  Her right knee is the more severely arthritic of the 2 knees.  Follow-Up Instructions: No follow-ups on file.   Orders:  Orders Placed This Encounter  Procedures  . XR Knee 1-2 Views Right  . XR KNEE 3 VIEW LEFT   No orders of the defined types were placed in this encounter.     Procedures: No procedures performed   Clinical Data: No additional findings.  Objective: Vital Signs: There were no vitals taken for this visit.  Physical Exam:   Constitutional: Patient appears well-developed HEENT:  Head: Normocephalic Eyes:EOM are normal Neck: Normal range of  motion Cardiovascular: Normal rate Pulmonary/chest: Effort normal Neurologic: Patient is alert Skin: Skin is warm Psychiatric: Patient has normal mood and affect    Ortho Exam: Ortho exam demonstrates full active and passive range of motion of the hips and ankles.  Right knee has 10 degree flexion contracture with flexion just past 90 degrees.  Left knee has a range of motion of about 3 degrees to 110.  Collaterals are stable extensor mechanism is intact.  No groin pain with internal extra rotation of the leg.  Ankle dorsiflexion intact bilaterally with palpable pedal pulses.  Patient does use a cane and takes ibuprofen and Tylenol for her symptoms.  Specialty Comments:  No specialty comments available.  Imaging: No results found.   PMFS History: Patient Active Problem List   Diagnosis Date Noted  . Gait disturbance 01/29/2020  . Periodic limb movement disorder 08/30/2019  . Nocturia 07/01/2018  . Midline cystocele 06/08/2018  . Recurrent UTI 06/08/2018  . Vaginal vault prolapse 06/08/2018  . Lightheadedness 06/02/2018  . Near syncope   . Hypotension 03/17/2018  . Postural dizziness with presyncope 03/17/2018  . PAF (paroxysmal atrial fibrillation) ([place]) 03/17/2018  . Memory loss 11/25/2017  . Acute lower UTI 10/29/2017  . Abnormal stress test   . Chest pain 11/23/2016  . Hyperlipidemia  11/23/2016  . GERD (gastroesophageal reflux disease) 11/23/2016  . History of recurrent UTIs 11/23/2016  . Polyneuropathy 10/30/2016  . Bursitis, subacromial 10/30/2016  . Numbness 10/30/2016  . Arthritis, senescent 01/03/2016  . Left knee pain 01/03/2016  . Pain in joint, ankle and foot 02/28/2015  . Dry mouth 08/07/2014  . Dry eyes 08/07/2014  . Other headache syndrome 06/25/2014  . Sinusitis, chronic 06/25/2014  . Neck pain 06/25/2014  . OSA on CPAP 06/25/2014  . Essential hypertension, benign 06/25/2014   Past Medical History:  Diagnosis Date  . Acute blood loss anemia  10/27/2017  . Acute GI bleeding 10/27/2017  . Anemia   . Arthritis    "joints" (11/10/2017)  . Dizziness   . GERD (gastroesophageal reflux disease)   . GI bleed 11/06/2017  . Hearing loss   . History of blood transfusion 10/2017  . Hyperlipidemia   . Hyperthyroidism   . Melanoma ([place])    "cut off my back"  . Memory loss   . Sinus headache   . Sleep apnea     Family History  Problem Relation Age of Onset  . Congestive Heart Failure Mother   . Heart disease Father        History of heart attacks at a later age.    Marland Kitchen Heart disease Brother   . Hearing loss Brother     Past Surgical History:  Procedure Laterality Date  . ABDOMINAL HYSTERECTOMY     "partial"  . APPENDECTOMY    . BIOPSY  11/17/2017   Procedure: BIOPSY;  Surgeon: Clarene Essex, MD;  Location: [facility];  Service: Endoscopy;;  . COLONOSCOPY WITH PROPOFOL N/A 11/17/2017   Procedure: COLONOSCOPY WITH PROPOFOL;  Surgeon: Clarene Essex, MD;  Location: [facility];  Service: Endoscopy;  Laterality: N/A;  . CRANIECTOMY FOR EXCISION OF ACOUSTIC NEUROMA    . ESOPHAGOGASTRODUODENOSCOPY (EGD) WITH PROPOFOL N/A 10/28/2017   Procedure: ESOPHAGOGASTRODUODENOSCOPY (EGD) WITH PROPOFOL;  Surgeon: Clarene Essex, MD;  [facility];  Service: Endoscopy;  Laterality: N/A;  . KNEE ARTHROSCOPY Left   . LEFT HEART CATH AND CORONARY ANGIOGRAPHY N/A 11/24/2016   Procedure: LEFT HEART CATH AND CORONARY ANGIOGRAPHY;  Surgeon: Jettie Booze, MD;  Location: [facility];  Service: Cardiovascular;  Laterality: N/A;  . MELANOMA EXCISION     "off my back"  . NASAL SINUS SURGERY    . TONSILLECTOMY     Social History   Occupational History  . Not on file  Tobacco Use  . Smoking status: Former Smoker    Years: 2.00    Types: Cigarettes    Quit date: 1980    Years since quitting: 41.8  . Smokeless tobacco: Never Used  . Tobacco comment: 11/10/2017 "only smoked when I was on my period"  Vaping Use  . Vaping Use: Never used   Substance and Sexual Activity  . Alcohol use: Yes    Comment: 11/10/2017 "nothing lately; did have 1 glass of wine q night"  . Drug use: Never  . Sexual activity: Not Currently

## 2020-02-14 ENCOUNTER — Ambulatory Visit (INDEPENDENT_AMBULATORY_CARE_PROVIDER_SITE_OTHER): Payer: Medicare PPO | Admitting: Podiatry

## 2020-02-14 ENCOUNTER — Other Ambulatory Visit: Payer: Self-pay

## 2020-02-14 DIAGNOSIS — Z01818 Encounter for other preprocedural examination: Secondary | ICD-10-CM | POA: Diagnosis not present

## 2020-02-14 DIAGNOSIS — M898X7 Other specified disorders of bone, ankle and foot: Secondary | ICD-10-CM

## 2020-02-15 ENCOUNTER — Other Ambulatory Visit: Payer: Self-pay | Admitting: Neurology

## 2020-02-15 ENCOUNTER — Encounter: Payer: Self-pay | Admitting: Podiatry

## 2020-02-15 DIAGNOSIS — N390 Urinary tract infection, site not specified: Secondary | ICD-10-CM | POA: Diagnosis not present

## 2020-02-15 NOTE — Progress Notes (Signed)
Subjective:  [name], female    DOB: 1940/11/25,  MRN: [id number]  Chief Complaint  Patient presents with  . Callouses    surgery consult - discuss possible exostectomy    79 y.o. female presents with the above complaint.  Patient presents with bilateral midfoot hyperkeratotic lesion follow-up.  She is doing better with accommodative inserts however still hurts.  At this time patient would like to discuss surgical treatment options that they have failed all conservative treatments including bracing padding and debridement of the hyperkeratotic lesion.  Review of Systems: Negative except as noted in the HPI. Denies N/V/F/Ch.  Past Medical History:  Diagnosis Date  . Acute blood loss anemia 10/27/2017  . Acute GI bleeding 10/27/2017  . Anemia   . Arthritis    "joints" (11/10/2017)  . Dizziness   . GERD (gastroesophageal reflux disease)   . GI bleed 11/06/2017  . Hearing loss   . History of blood transfusion 10/2017  . Hyperlipidemia   . Hyperthyroidism   . Melanoma ([place])    "cut off my back"  . Memory loss   . Sinus headache   . Sleep apnea     Current Outpatient Medications:  .  acetaminophen (TYLENOL) 500 MG tablet, Take 500 mg by mouth 2 (two) times daily. , Disp: , Rfl:  .  Alpha-Lipoic Acid (LIPOIC ACID PO), Take 2 tablets by mouth 2 (two) times daily. , Disp: , Rfl:  .  amoxicillin (AMOXIL) 500 MG capsule, amoxicillin 500 mg capsule  TAKE 1 CAPSULE BY MOUTH EVERY 12 HOURS FOR 7 DAYS (Patient not taking: Reported on 02/02/2020), Disp: , Rfl:  .  atorvastatin (LIPITOR) 10 MG tablet, TAKE 1 TABLET BY MOUTH EVERY DAY, Disp: 90 tablet, Rfl: 3 .  Calcium Carbonate-Vitamin D3 600-400 MG-UNIT TABS, Take 1 tablet by mouth 2 (two) times a day. , Disp: , Rfl:  .  cholecalciferol (VITAMIN D) 1000 UNITS tablet, Take 1,000 Units by mouth daily., Disp: , Rfl:  .  cycloSPORINE (RESTASIS) 0.05 % ophthalmic emulsion, Restasis 0.05 % eye drops in a dropperette  PLACE 1 DROP  IN AFFECTED EYE TWICE A DAY, Disp: , Rfl:  .  desmopressin (DDAVP) 0.2 MG tablet, Take 3 tablets by mouth at bedtime., Disp: , Rfl:  .  diltiazem (CARDIZEM CD) 120 MG 24 hr capsule, Take 1 capsule (120 mg total) by mouth daily. (Patient taking differently: Take 120 mg by mouth every other day. ), Disp: 30 capsule, Rfl: 0 .  DULoxetine (CYMBALTA) 30 MG capsule, TAKE 1 CAPSULE BY MOUTH TWICE A DAY, Disp: 180 capsule, Rfl: 3 .  ELIQUIS 5 MG TABS tablet, TAKE 1 TABLET BY MOUTH TWICE A DAY, Disp: 60 tablet, Rfl: 5 .  fluticasone (FLONASE) 50 MCG/ACT nasal spray, Place into the nose., Disp: , Rfl:  .  GLUCOSAMINE-CHONDROITIN PO, Take 2 tablets by mouth daily with lunch. Glucosamine 1500mg  Chondroitin 1200mg , Disp: , Rfl:  .  ibuprofen (ADVIL) 200 MG tablet, Take 200 mg by mouth every 6 (six) hours as needed., Disp: , Rfl:  .  Lutein-Zeaxanthin 25-5 MG CAPS, Take 1 tablet by mouth daily., Disp: , Rfl:  .  meclizine (ANTIVERT) 25 MG tablet, Take 1 tablet by mouth as needed., Disp: , Rfl:  .  Melatonin 3 MG TABS, Take 3 mg by mouth at bedtime., Disp: , Rfl:  .  Multiple Vitamin (MULTIVITAMIN WITH MINERALS) TABS tablet, Take 1 tablet by mouth daily., Disp: , Rfl:  .  nitrofurantoin (MACRODANTIN)  50 MG capsule, Take 50 mg by mouth daily., Disp: , Rfl:  .  nitroGLYCERIN (NITROLINGUAL) 0.4 MG/SPRAY spray, Place 1 spray under the tongue every 5 (five) minutes x 3 doses as needed for chest pain., Disp: 12 g, Rfl: 1 .  Omega-3 1000 MG CAPS, Take 1,000 mg by mouth daily., Disp: , Rfl:  .  pantoprazole (PROTONIX) 40 MG tablet, Take 40 mg by mouth daily before breakfast. , Disp: , Rfl: 1 .  sertraline (ZOLOFT) 25 MG tablet, Take 25 mg by mouth daily., Disp: , Rfl:  .  sucralfate (CARAFATE) 1 g tablet, Take 1 g by mouth 3 (three) times daily. , Disp: , Rfl:  .  thyroid (ARMOUR) 60 MG tablet, Take 60 mg by mouth daily before breakfast., Disp: , Rfl:  .  tiZANidine (ZANAFLEX) 4 MG tablet, Take 1 tablet (4 mg total) by  mouth 3 (three) times daily as needed., Disp: 90 tablet, Rfl: 5 .  traMADol (ULTRAM) 50 MG tablet, Take 1 tablet by mouth every 6 (six) hours as needed., Disp: , Rfl:  .  vitamin C (ASCORBIC ACID) 500 MG tablet, Take 1,000 mg by mouth daily. , Disp: , Rfl:   Social History   Tobacco Use  Smoking Status Former Smoker  . Years: 2.00  . Types: Cigarettes  . Quit date: 64  . Years since quitting: 41.8  Smokeless Tobacco Never Used  Tobacco Comment   11/10/2017 "only smoked when I was on my period"    Allergies  Allergen Reactions  . Avelox [Moxifloxacin Hcl In Nacl] Other (See Comments)    unk  . Alendronate Sodium Nausea Only   Objective:  There were no vitals filed for this visit. There is no height or weight on file to calculate BMI. Constitutional Well developed. Well nourished.  Vascular Dorsalis pedis pulses palpable bilaterally. Posterior tibial pulses palpable bilaterally. Capillary refill normal to all digits.  No cyanosis or clubbing noted. Pedal hair growth normal.  Neurologic Normal speech. Oriented to person, place, and time. Epicritic sensation to light touch grossly present bilaterally.  Dermatologic  thickened elongated dystrophic toenails x10.  Mild pain on palpation. No open wounds. No skin lesions.  Orthopedic:  Pain on palpation to the bilateral midfoot hyperkeratotic lesion.  The lesion appeared to be due to underlying pes planus deformity with likely fat pad atrophy leading to aggressive pressure.  No open wounds or ulcers noted.  No clinical signs of infection noted.   Radiographs:  Prior radiographs were reviewed in extensive detail of bilaterally skeletally mature adult: Severe pes planus structure noted with decreasing calcaneal inclination angle increase in talar declination angle anterior break in the cyma line medial column collapse.  Exostosis noted of the plantar midfoot bilaterally. Assessment:   1. Exostosis of right foot   2. Exostosis of  left foot    Plan:  Patient was evaluated and treated and all questions answered.  Bilateral midfoot benign skin lesion with underlying pes planovalgus deformity with medial column collapse -She was not able to tolerate the Miso braces.  Ultimately rec discussed with her the reason for not being able to tolerate that has to do with the underlying foot deformity that she has going on.  She did states that she was able to benefit more from accommodative inserts that are usually made for diabetic shoes.  Given that she is not a diabetic she does not qualify for the diabetic shoes/insoles.  However rec had given her a free pair of accommodative  inserts which seems to be helping really well.  I discussed with her that the start wearing out she can see [place] back again and we can see if there is any more that accommodative inserts. -Given that there is continuous pain and she has failed all conservative therapy I believe patient will benefit from surgical intervention at this point.  Given that patient is having exostosis of the plantar midfoot bilaterally with underlying hyperkeratotic lesion from medial column collapse I believe she will benefit from exostectomy of the bone and therefore taking some of the pressure away.  I discussed my surgical plan that includes exostectomy partially of the protruding bone bilaterally.  Patient states understanding would like to proceed with the surgery.  She will be weightbearing as tolerated in surgical shoe bilaterally.  I discussed all my postoperative plan as well as my preoperative plan with the patient in extensive detail she would like to proceed despite all the risks and complication discussed. -Informed surgical risk consent was reviewed and read aloud to the patient.  I reviewed the films.  I have discussed my findings with the patient in great detail.  I have discussed all risks including but not limited to infection, stiffness, scarring, limp, disability, deformity,  damage to blood vessels and nerves, numbness, poor healing, need for braces, arthritis, chronic pain, amputation, death.  All benefits and realistic expectations discussed in great detail.  I have made no promises as to the outcome.  I have provided realistic expectations.  I have offered the patient a 2nd opinion, which they have declined and assured me they preferred to proceed despite the risks -A total of 33 minutes was spent in direct patient care as well as pre and post patient encounter activities.  This includes documentation as well as reviewing patient chart for labs, imaging, past medical, surgical, social, and family history as documented in the EMR.  I have reviewed medication allergies as documented in EMR.  I discussed the etiology of condition and treatment options from conservative to surgical care.  All risks and benefit of the treatment course was discussed in detail.  All questions were answered and return appointment was discussed.  Since the visit completed in an ambulatory/outpatient setting, the patient and/or parent/guardian has been advised to contact the providers office for worsening condition and seek medical treatment and/or call 911 if the patient deems either is necessary.    Boneta Lucks, DPM    No follow-ups on file.     No follow-ups on file.

## 2020-02-16 ENCOUNTER — Telehealth: Payer: Self-pay

## 2020-02-16 NOTE — Telephone Encounter (Signed)
DOS 03/04/2020  TARSAL EXOSTECTOMY B/L - 28104  [employer] EFFECTIVE May 04, 2017  The following codes do not require a pre-authorization Created on 02/16/2020  [place] [phone] Removal or scraping of bone cyst or growth of ankle bone 28104 Removal or scraping of bone cyst or growth of ankle bone

## 2020-02-19 ENCOUNTER — Other Ambulatory Visit: Payer: Self-pay | Admitting: Neurology

## 2020-02-19 ENCOUNTER — Telehealth: Payer: Self-pay | Admitting: Neurology

## 2020-02-19 DIAGNOSIS — G9389 Other specified disorders of brain: Secondary | ICD-10-CM

## 2020-02-19 DIAGNOSIS — R269 Unspecified abnormalities of gait and mobility: Secondary | ICD-10-CM

## 2020-02-19 NOTE — Telephone Encounter (Signed)
I have placed an order for a lumbar puncture to assess for normal pressure hydrocephalus.  She is on blood thinners and will need to hold for 2 days before and 1 day after lumbar puncture

## 2020-02-19 NOTE — Telephone Encounter (Signed)
Pt.'s husband Jearld Fenton is on [place]. He states it was discussed about wife having spinal tap & he states this is a good idea, let's pick a time for surgery. Please advise.

## 2020-02-20 ENCOUNTER — Other Ambulatory Visit: Payer: Self-pay | Admitting: Cardiology

## 2020-02-20 NOTE — Telephone Encounter (Signed)
Called and spoke with [place]. Relayed Dr. Garth Bigness message. He read back directions to have her hold blood thinners 2 days prior and 1 day after LP. Aware procedure has to be approved via insurance and then they will be called to get scheduled. He would like to be called at [phone] to schedule. I put this in appt notes for LP.

## 2020-02-22 ENCOUNTER — Other Ambulatory Visit: Payer: Self-pay | Admitting: Cardiology

## 2020-02-23 NOTE — Telephone Encounter (Signed)
The appt note for the LP states Cathy called and LMOM on 02/20/20. I called the pt's husband back Jearld Fenton @ [phone]) and advised GI had tried to call him and he would need to call them back to schedule. I provided the number. He verbalized appreciation. He also stated that he was the impression that our office would check with Dr Einar Gip for his approval to come off the medication. He stated he would contact Dr Einar Gip in the meantime.

## 2020-02-23 NOTE — Telephone Encounter (Signed)
Pt's husband, Talon Witting (on [place]) called want to know where we stand in the process of getting the spinal tap. To move ahead cardiology to approve taking off blood thinner before and after the procedure for the time slot. Would like a called from the nurse.

## 2020-02-26 DIAGNOSIS — E870 Hyperosmolality and hypernatremia: Secondary | ICD-10-CM | POA: Diagnosis not present

## 2020-02-26 NOTE — Telephone Encounter (Signed)
Called and spoke with husband. He tried calling D.r Einar Gip this past Friday but has not heard back yet. He is going to try this morning to reach them. He will call us back to let us know what Dr. Irven Shelling office says.

## 2020-02-26 NOTE — Telephone Encounter (Signed)
Pt's husband, Tykisha Areola (on [place]) called, I contacted Dr. Irven Shelling office. Dr. Einar Gip said he is expecting a call from Dr. Garth Bigness office and expecting a form to be sent to include what would be the procedure. Dr. Irven Shelling phone number; [phone].Marland Kitchen

## 2020-02-28 DIAGNOSIS — R351 Nocturia: Secondary | ICD-10-CM | POA: Diagnosis not present

## 2020-02-29 ENCOUNTER — Encounter: Payer: Self-pay | Admitting: Neurology

## 2020-03-04 ENCOUNTER — Other Ambulatory Visit: Payer: Self-pay

## 2020-03-04 ENCOUNTER — Telehealth: Payer: Self-pay | Admitting: Neurology

## 2020-03-04 ENCOUNTER — Ambulatory Visit
Admission: RE | Admit: 2020-03-04 | Discharge: 2020-03-04 | Disposition: A | Discharge status: Home / Self Care | Payer: Medicare PPO | Source: Ambulatory Visit | Attending: Neurology | Admitting: Neurology

## 2020-03-04 DIAGNOSIS — G9389 Other specified disorders of brain: Secondary | ICD-10-CM

## 2020-03-04 DIAGNOSIS — R2689 Other abnormalities of gait and mobility: Secondary | ICD-10-CM | POA: Diagnosis not present

## 2020-03-04 DIAGNOSIS — Q048 Other specified congenital malformations of brain: Secondary | ICD-10-CM | POA: Diagnosis not present

## 2020-03-04 DIAGNOSIS — R269 Unspecified abnormalities of gait and mobility: Secondary | ICD-10-CM

## 2020-03-04 NOTE — Telephone Encounter (Signed)
Called husband back. Advised per Dr. Felecia Shelling that he wants him to call tomorrow to let us know if there was any improvement in her gait/bladder. He verbalized understanding. He asked about monitoring memory. Advised per Dr. Felecia Shelling that memory may be affected, he can monitor this as well to see if there is any improvement.

## 2020-03-04 NOTE — Discharge Instructions (Signed)

## 2020-03-04 NOTE — Telephone Encounter (Signed)
Pt.'s husband Jearld Fenton is on [place]. He states Dr. drew 35 mil. of fluid off wife's spine today & has recommended she see Dr. Felecia Shelling tomorrow. He states that was quite a bit. Please advise.

## 2020-03-05 ENCOUNTER — Telehealth: Payer: Self-pay | Admitting: Neurology

## 2020-03-05 DIAGNOSIS — R269 Unspecified abnormalities of gait and mobility: Secondary | ICD-10-CM

## 2020-03-05 DIAGNOSIS — G912 (Idiopathic) normal pressure hydrocephalus: Secondary | ICD-10-CM

## 2020-03-05 NOTE — Telephone Encounter (Signed)
I spoke to Mr. Cynthia [name].  She had a high-volume lumbar puncture yesterday.  She does not feel much different but her husband notes that her walking is notably better.  Specifically, her stride has improved and she seems more stable.  She is "no longer walking like an old lady".  He also feels that her cognition is mildly better, processing more quickly  I discussed a neurosurgical referral so that they could consider having a shunt placed which might offer longer-term benefit

## 2020-03-05 NOTE — Telephone Encounter (Signed)
Pt called to inform RN of update after her Spinal Tap. Pt states she is doing well. Please call pt back if any questions.

## 2020-03-10 ENCOUNTER — Other Ambulatory Visit: Payer: Self-pay | Admitting: Cardiology

## 2020-03-10 DIAGNOSIS — I480 Paroxysmal atrial fibrillation: Secondary | ICD-10-CM

## 2020-03-11 DIAGNOSIS — M810 Age-related osteoporosis without current pathological fracture: Secondary | ICD-10-CM | POA: Diagnosis not present

## 2020-03-11 DIAGNOSIS — E7800 Pure hypercholesterolemia, unspecified: Secondary | ICD-10-CM | POA: Diagnosis not present

## 2020-03-11 DIAGNOSIS — M8580 Other specified disorders of bone density and structure, unspecified site: Secondary | ICD-10-CM | POA: Diagnosis not present

## 2020-03-11 DIAGNOSIS — I10 Essential (primary) hypertension: Secondary | ICD-10-CM | POA: Diagnosis not present

## 2020-03-11 DIAGNOSIS — D508 Other iron deficiency anemias: Secondary | ICD-10-CM | POA: Diagnosis not present

## 2020-03-11 DIAGNOSIS — E039 Hypothyroidism, unspecified: Secondary | ICD-10-CM | POA: Diagnosis not present

## 2020-03-13 ENCOUNTER — Encounter: Payer: Medicare PPO | Admitting: Podiatry

## 2020-03-13 ENCOUNTER — Telehealth: Payer: Self-pay | Admitting: Neurology

## 2020-03-13 NOTE — Telephone Encounter (Signed)
Pt called, 2 or 3 days after spinal tap procedure she could feel the pressure building up. Dr. Felecia Shelling would be getting in touch with surgeon. Have not heard from the surgeon. Would like a call from the nurse.

## 2020-03-13 NOTE — Telephone Encounter (Signed)
Called back and advised referral placed to [facility] on 03/05/20. They can call them to schedule at  [phone].  He states wife wondering if anything can be called in for her pain. Taking Tylenol OTC and alternating with ibuprofen. Advised I will send MD message and call back with his recommendation.

## 2020-03-14 DIAGNOSIS — G912 (Idiopathic) normal pressure hydrocephalus: Secondary | ICD-10-CM | POA: Diagnosis not present

## 2020-03-14 NOTE — Telephone Encounter (Signed)
Let's send in a stronger anti-inflammatory: Etodolac 400 mg #60   1 p.o. twice daily  1 refill

## 2020-03-14 NOTE — Telephone Encounter (Signed)
Dr. Saundra Shelling and spoke with pt/wife. Relayed Dr. Garth Bigness recommendation. Advised she would not be able to take any other NSAIDS with it.  She actually just got to appt with [facility] and would like to have initial consult with them first and will call back to let us know how she would like to proceed.

## 2020-03-15 MED ORDER — ETODOLAC ER 400 MG PO TB24: ORAL_TABLET | ORAL | 1 refills | Status: DC

## 2020-03-15 NOTE — Addendum Note (Signed)
Addended by: Wyvonnia Lora on: 03/15/2020 08:48 AM   Modules accepted: Orders

## 2020-03-15 NOTE — Telephone Encounter (Signed)
[place],Cynthia [name](husband on [place]) has called to report he would like to move forward in having the pain reliever called in for pt to [id number]

## 2020-03-15 NOTE — Telephone Encounter (Signed)
E-scribed rx to pharmacy as requested.

## 2020-03-18 DIAGNOSIS — M25774 Osteophyte, right foot: Secondary | ICD-10-CM | POA: Diagnosis not present

## 2020-03-18 DIAGNOSIS — M898X7 Other specified disorders of bone, ankle and foot: Secondary | ICD-10-CM | POA: Diagnosis not present

## 2020-03-18 DIAGNOSIS — M25775 Osteophyte, left foot: Secondary | ICD-10-CM | POA: Diagnosis not present

## 2020-03-18 MED ORDER — IBUPROFEN 800 MG PO TABS: 800.0000 mg | ORAL_TABLET | Freq: Four times a day (QID) | ORAL | 1 refills | Status: DC | PRN

## 2020-03-18 MED ORDER — OXYCODONE-ACETAMINOPHEN 5-325 MG PO TABS: 1.0000 | ORAL_TABLET | ORAL | 0 refills | Status: DC | PRN

## 2020-03-18 NOTE — Addendum Note (Signed)
Addended by: Boneta Lucks on: 03/18/2020 11:07 AM   Modules accepted: Orders

## 2020-03-19 ENCOUNTER — Telehealth: Payer: Self-pay | Admitting: *Deleted

## 2020-03-19 DIAGNOSIS — R2681 Unsteadiness on feet: Secondary | ICD-10-CM

## 2020-03-19 DIAGNOSIS — M898X7 Other specified disorders of bone, ankle and foot: Secondary | ICD-10-CM

## 2020-03-19 NOTE — Telephone Encounter (Signed)
Called and informed patient's husband of Dr Serita Grit message,verbalized understanding but is still a little upset and would like also to recommend  short term Rehabilition if necessary

## 2020-03-19 NOTE — Telephone Encounter (Signed)
Husband is requesting an in home assistant/caretaker for wife, just had B/ L arch surgery and is in so much pain, unable to walk. He has a bad back and can not  lift her. Please call.

## 2020-03-19 NOTE — Telephone Encounter (Signed)
Patients husband requesting in home care for wife, unable to complete daily tasks for wife and unable to complete self care without complications. Please advise

## 2020-03-19 NOTE — Telephone Encounter (Signed)
Dr. Posey Pronto reviewed the message and said if she is approved, she can get Home health to come out to assist.  This is a [facility] patient

## 2020-03-20 ENCOUNTER — Encounter (HOSPITAL_COMMUNITY): Payer: Self-pay | Admitting: Internal Medicine

## 2020-03-20 ENCOUNTER — Emergency Department (HOSPITAL_COMMUNITY): Payer: Medicare PPO

## 2020-03-20 ENCOUNTER — Telehealth: Payer: Self-pay

## 2020-03-20 ENCOUNTER — Inpatient Hospital Stay (HOSPITAL_COMMUNITY)
Admission: EM | Admit: 2020-03-20 | Discharge: 2020-03-26 | DRG: 308 | Disposition: A | Discharge status: Skilled Nursing Facility | Payer: Medicare PPO | Attending: Internal Medicine | Admitting: Internal Medicine

## 2020-03-20 DIAGNOSIS — R2689 Other abnormalities of gait and mobility: Secondary | ICD-10-CM | POA: Diagnosis present

## 2020-03-20 DIAGNOSIS — K219 Gastro-esophageal reflux disease without esophagitis: Secondary | ICD-10-CM | POA: Diagnosis present

## 2020-03-20 DIAGNOSIS — Z8249 Family history of ischemic heart disease and other diseases of the circulatory system: Secondary | ICD-10-CM

## 2020-03-20 DIAGNOSIS — N3281 Overactive bladder: Secondary | ICD-10-CM | POA: Diagnosis present

## 2020-03-20 DIAGNOSIS — M898X7 Other specified disorders of bone, ankle and foot: Secondary | ICD-10-CM | POA: Diagnosis not present

## 2020-03-20 DIAGNOSIS — Z20822 Contact with and (suspected) exposure to covid-19: Secondary | ICD-10-CM | POA: Diagnosis present

## 2020-03-20 DIAGNOSIS — Y92009 Unspecified place in unspecified non-institutional (private) residence as the place of occurrence of the external cause: Secondary | ICD-10-CM

## 2020-03-20 DIAGNOSIS — I10 Essential (primary) hypertension: Secondary | ICD-10-CM | POA: Diagnosis present

## 2020-03-20 DIAGNOSIS — I4891 Unspecified atrial fibrillation: Secondary | ICD-10-CM | POA: Diagnosis present

## 2020-03-20 DIAGNOSIS — E871 Hypo-osmolality and hyponatremia: Secondary | ICD-10-CM | POA: Diagnosis present

## 2020-03-20 DIAGNOSIS — R52 Pain, unspecified: Secondary | ICD-10-CM | POA: Diagnosis not present

## 2020-03-20 DIAGNOSIS — R410 Disorientation, unspecified: Secondary | ICD-10-CM | POA: Diagnosis not present

## 2020-03-20 DIAGNOSIS — Z9989 Dependence on other enabling machines and devices: Secondary | ICD-10-CM | POA: Diagnosis not present

## 2020-03-20 DIAGNOSIS — T402X5A Adverse effect of other opioids, initial encounter: Secondary | ICD-10-CM | POA: Diagnosis present

## 2020-03-20 DIAGNOSIS — R42 Dizziness and giddiness: Secondary | ICD-10-CM

## 2020-03-20 DIAGNOSIS — W06XXXA Fall from bed, initial encounter: Secondary | ICD-10-CM | POA: Diagnosis present

## 2020-03-20 DIAGNOSIS — R269 Unspecified abnormalities of gait and mobility: Secondary | ICD-10-CM

## 2020-03-20 DIAGNOSIS — J9811 Atelectasis: Secondary | ICD-10-CM | POA: Diagnosis not present

## 2020-03-20 DIAGNOSIS — R262 Difficulty in walking, not elsewhere classified: Secondary | ICD-10-CM | POA: Diagnosis present

## 2020-03-20 DIAGNOSIS — E785 Hyperlipidemia, unspecified: Secondary | ICD-10-CM | POA: Diagnosis present

## 2020-03-20 DIAGNOSIS — Z85820 Personal history of malignant melanoma of skin: Secondary | ICD-10-CM | POA: Diagnosis not present

## 2020-03-20 DIAGNOSIS — G9340 Encephalopathy, unspecified: Secondary | ICD-10-CM | POA: Diagnosis not present

## 2020-03-20 DIAGNOSIS — G4489 Other headache syndrome: Secondary | ICD-10-CM | POA: Diagnosis not present

## 2020-03-20 DIAGNOSIS — R413 Other amnesia: Secondary | ICD-10-CM | POA: Diagnosis not present

## 2020-03-20 DIAGNOSIS — Z87440 Personal history of urinary (tract) infections: Secondary | ICD-10-CM | POA: Diagnosis not present

## 2020-03-20 DIAGNOSIS — E039 Hypothyroidism, unspecified: Secondary | ICD-10-CM | POA: Diagnosis present

## 2020-03-20 DIAGNOSIS — H9190 Unspecified hearing loss, unspecified ear: Secondary | ICD-10-CM | POA: Diagnosis present

## 2020-03-20 DIAGNOSIS — R4781 Slurred speech: Secondary | ICD-10-CM | POA: Diagnosis not present

## 2020-03-20 DIAGNOSIS — I700 Atherosclerosis of aorta: Secondary | ICD-10-CM | POA: Diagnosis present

## 2020-03-20 DIAGNOSIS — E876 Hypokalemia: Secondary | ICD-10-CM | POA: Diagnosis present

## 2020-03-20 DIAGNOSIS — Z7401 Bed confinement status: Secondary | ICD-10-CM | POA: Diagnosis not present

## 2020-03-20 DIAGNOSIS — G9389 Other specified disorders of brain: Secondary | ICD-10-CM | POA: Diagnosis present

## 2020-03-20 DIAGNOSIS — Z87891 Personal history of nicotine dependence: Secondary | ICD-10-CM

## 2020-03-20 DIAGNOSIS — I480 Paroxysmal atrial fibrillation: Principal | ICD-10-CM | POA: Diagnosis present

## 2020-03-20 DIAGNOSIS — F0390 Unspecified dementia without behavioral disturbance: Secondary | ICD-10-CM | POA: Diagnosis present

## 2020-03-20 DIAGNOSIS — Z888 Allergy status to other drugs, medicaments and biological substances status: Secondary | ICD-10-CM

## 2020-03-20 DIAGNOSIS — R0902 Hypoxemia: Secondary | ICD-10-CM | POA: Diagnosis not present

## 2020-03-20 DIAGNOSIS — R29818 Other symptoms and signs involving the nervous system: Secondary | ICD-10-CM | POA: Diagnosis not present

## 2020-03-20 DIAGNOSIS — G928 Other toxic encephalopathy: Secondary | ICD-10-CM | POA: Diagnosis present

## 2020-03-20 DIAGNOSIS — G4733 Obstructive sleep apnea (adult) (pediatric): Secondary | ICD-10-CM | POA: Diagnosis present

## 2020-03-20 DIAGNOSIS — M2550 Pain in unspecified joint: Secondary | ICD-10-CM | POA: Diagnosis not present

## 2020-03-20 DIAGNOSIS — R531 Weakness: Secondary | ICD-10-CM | POA: Diagnosis not present

## 2020-03-20 DIAGNOSIS — R29810 Facial weakness: Secondary | ICD-10-CM | POA: Diagnosis not present

## 2020-03-20 LAB — DIFFERENTIAL
Abs Immature Granulocytes: 0.03 10*3/uL (ref 0.00–0.07)
Basophils Absolute: 0 10*3/uL (ref 0.0–0.1)
Basophils Relative: 0 %
Eosinophils Absolute: 0.1 10*3/uL (ref 0.0–0.5)
Eosinophils Relative: 1 %
Immature Granulocytes: 0 %
Lymphocytes Relative: 17 %
Lymphs Abs: 1.7 10*3/uL (ref 0.7–4.0)
Monocytes Absolute: 1.2 10*3/uL — ABNORMAL HIGH (ref 0.1–1.0)
Monocytes Relative: 12 %
Neutro Abs: 6.9 10*3/uL (ref 1.7–7.7)
Neutrophils Relative %: 70 %

## 2020-03-20 LAB — CBC
HCT: 36.7 % (ref 36.0–46.0)
Hemoglobin: 11.7 g/dL — ABNORMAL LOW (ref 12.0–15.0)
MCH: 30.3 pg (ref 26.0–34.0)
MCHC: 31.9 g/dL (ref 30.0–36.0)
MCV: 95.1 fL (ref 80.0–100.0)
Platelets: 203 10*3/uL (ref 150–400)
RBC: 3.86 MIL/uL — ABNORMAL LOW (ref 3.87–5.11)
RDW: 13.7 % (ref 11.5–15.5)
WBC: 9.9 10*3/uL (ref 4.0–10.5)
nRBC: 0 % (ref 0.0–0.2)

## 2020-03-20 LAB — COMPREHENSIVE METABOLIC PANEL
ALT: 18 U/L (ref 0–44)
AST: 26 U/L (ref 15–41)
Albumin: 3.3 g/dL — ABNORMAL LOW (ref 3.5–5.0)
Alkaline Phosphatase: 41 U/L (ref 38–126)
Anion gap: 9 (ref 5–15)
BUN: 21 mg/dL (ref 8–23)
CO2: 28 mmol/L (ref 22–32)
Calcium: 8.8 mg/dL — ABNORMAL LOW (ref 8.9–10.3)
Chloride: 101 mmol/L (ref 98–111)
Creatinine, Ser: 0.65 mg/dL (ref 0.44–1.00)
GFR, Estimated: >60 mL/min (ref >60)
Glucose, Bld: 115 mg/dL — ABNORMAL HIGH (ref 70–99)
Potassium: 3.2 mmol/L — ABNORMAL LOW (ref 3.5–5.1)
Sodium: 138 mmol/L (ref 135–145)
Total Bilirubin: 0.8 mg/dL (ref 0.3–1.2)
Total Protein: 6 g/dL — ABNORMAL LOW (ref 6.5–8.1)

## 2020-03-20 LAB — I-STAT CHEM 8, ED
BUN: 25 mg/dL — ABNORMAL HIGH (ref 8–23)
Calcium, Ion: 1.13 mmol/L — ABNORMAL LOW (ref 1.15–1.40)
Chloride: 100 mmol/L (ref 98–111)
Creatinine, Ser: 0.5 mg/dL (ref 0.44–1.00)
Glucose, Bld: 109 mg/dL — ABNORMAL HIGH (ref 70–99)
HCT: 37 % (ref 36.0–46.0)
Hemoglobin: 12.6 g/dL (ref 12.0–15.0)
Potassium: 3.2 mmol/L — ABNORMAL LOW (ref 3.5–5.1)
Sodium: 141 mmol/L (ref 135–145)
TCO2: 28 mmol/L (ref 22–32)

## 2020-03-20 LAB — URINALYSIS, ROUTINE W REFLEX MICROSCOPIC
Bilirubin Urine: NEGATIVE
Glucose, UA: NEGATIVE mg/dL
Hgb urine dipstick: NEGATIVE
Ketones, ur: 5 mg/dL — AB
Leukocytes,Ua: NEGATIVE
Nitrite: NEGATIVE
Protein, ur: NEGATIVE mg/dL
Specific Gravity, Urine: 1.016 (ref 1.005–1.030)
pH: 7 (ref 5.0–8.0)

## 2020-03-20 LAB — RESP PANEL BY RT-PCR (FLU A&B, COVID) ARPGX2
Influenza A by PCR: NEGATIVE
Influenza B by PCR: NEGATIVE
SARS Coronavirus 2 by RT PCR: NEGATIVE

## 2020-03-20 LAB — CBG MONITORING, ED: Glucose-Capillary: 109 mg/dL — ABNORMAL HIGH (ref 70–99)

## 2020-03-20 LAB — PROTIME-INR
INR: 1 (ref 0.8–1.2)
Prothrombin Time: 12.9 seconds (ref 11.4–15.2)

## 2020-03-20 LAB — APTT: aPTT: 31 seconds (ref 24–36)

## 2020-03-20 MED ORDER — DILTIAZEM HCL ER COATED BEADS 120 MG PO CP24
120.0000 mg | ORAL_CAPSULE | Freq: Every day | ORAL | Status: DC
  Administered 2020-03-20 – 2020-03-26 (×7): 120 mg via ORAL
  Filled 2020-03-20 (×7): qty 1

## 2020-03-20 MED ORDER — DILTIAZEM HCL ER COATED BEADS 120 MG PO CP24: 120.0000 mg | ORAL_CAPSULE | Freq: Every day | ORAL | Status: DC

## 2020-03-20 MED ORDER — MECLIZINE HCL 25 MG PO TABS: 25.0000 mg | ORAL_TABLET | Freq: Every day | ORAL | Status: DC | PRN

## 2020-03-20 MED ORDER — SERTRALINE HCL 25 MG PO TABS: 25.0000 mg | ORAL_TABLET | Freq: Every day | ORAL | Status: DC

## 2020-03-20 MED ORDER — POTASSIUM CHLORIDE CRYS ER 20 MEQ PO TBCR
40.0000 meq | EXTENDED_RELEASE_TABLET | Freq: Two times a day (BID) | ORAL | Status: AC
  Administered 2020-03-20 (×2): 40 meq via ORAL
  Filled 2020-03-20 (×2): qty 2

## 2020-03-20 MED ORDER — DULOXETINE HCL 30 MG PO CPEP
30.0000 mg | ORAL_CAPSULE | Freq: Two times a day (BID) | ORAL | Status: DC
  Administered 2020-03-20 – 2020-03-26 (×12): 30 mg via ORAL
  Filled 2020-03-20 (×12): qty 1

## 2020-03-20 MED ORDER — ACETAMINOPHEN 500 MG PO TABS
500.0000 mg | ORAL_TABLET | Freq: Two times a day (BID) | ORAL | Status: DC
  Administered 2020-03-20 – 2020-03-26 (×12): 500 mg via ORAL
  Filled 2020-03-20 (×13): qty 1

## 2020-03-20 MED ORDER — OXYCODONE-ACETAMINOPHEN 5-325 MG PO TABS: 1.0000 | ORAL_TABLET | ORAL | Status: DC | PRN

## 2020-03-20 MED ORDER — PANTOPRAZOLE SODIUM 40 MG PO TBEC: 40.0000 mg | DELAYED_RELEASE_TABLET | Freq: Every day | ORAL | Status: DC

## 2020-03-20 MED ORDER — SODIUM CHLORIDE 0.9% FLUSH
3.0000 mL | Freq: Two times a day (BID) | INTRAVENOUS | Status: DC
  Administered 2020-03-21 – 2020-03-26 (×9): 3 mL via INTRAVENOUS

## 2020-03-20 MED ORDER — POLYETHYLENE GLYCOL 3350 17 G PO PACK
17.0000 g | PACK | Freq: Every day | ORAL | Status: DC | PRN
  Administered 2020-03-24: 17 g via ORAL
  Filled 2020-03-20: qty 1

## 2020-03-20 MED ORDER — CYCLOSPORINE 0.05 % OP EMUL
1.0000 [drp] | Freq: Two times a day (BID) | OPHTHALMIC | Status: DC
  Administered 2020-03-21 – 2020-03-26 (×11): 1 [drp] via OPHTHALMIC
  Filled 2020-03-20 (×14): qty 1

## 2020-03-20 MED ORDER — NITROFURANTOIN MACROCRYSTAL 50 MG PO CAPS
100.0000 mg | ORAL_CAPSULE | Freq: Every day | ORAL | Status: DC
  Administered 2020-03-21 – 2020-03-26 (×6): 100 mg via ORAL
  Filled 2020-03-20 (×7): qty 2
  Filled 2020-03-20: qty 1

## 2020-03-20 MED ORDER — PANTOPRAZOLE SODIUM 40 MG PO TBEC
40.0000 mg | DELAYED_RELEASE_TABLET | Freq: Every day | ORAL | Status: DC
  Administered 2020-03-21 – 2020-03-26 (×6): 40 mg via ORAL
  Filled 2020-03-20 (×6): qty 1

## 2020-03-20 MED ORDER — MELATONIN 3 MG PO TABS
6.0000 mg | ORAL_TABLET | Freq: Every day | ORAL | Status: DC
  Administered 2020-03-20 – 2020-03-25 (×6): 6 mg via ORAL
  Filled 2020-03-20 (×6): qty 2

## 2020-03-20 MED ORDER — APIXABAN 5 MG PO TABS
5.0000 mg | ORAL_TABLET | Freq: Two times a day (BID) | ORAL | Status: DC
  Administered 2020-03-20 – 2020-03-26 (×12): 5 mg via ORAL
  Filled 2020-03-20 (×12): qty 1

## 2020-03-20 MED ORDER — IBUPROFEN 200 MG PO TABS
400.0000 mg | ORAL_TABLET | Freq: Two times a day (BID) | ORAL | Status: DC
  Administered 2020-03-21 – 2020-03-26 (×11): 400 mg via ORAL
  Filled 2020-03-20 (×11): qty 2

## 2020-03-20 MED ORDER — DILTIAZEM HCL 25 MG/5ML IV SOLN
10.0000 mg | Freq: Once | INTRAVENOUS | Status: AC
  Administered 2020-03-20: 10 mg via INTRAVENOUS
  Filled 2020-03-20: qty 5

## 2020-03-20 MED ORDER — CALCIUM CARBONATE-VITAMIN D 500-200 MG-UNIT PO TABS
1.0000 | ORAL_TABLET | Freq: Two times a day (BID) | ORAL | Status: DC
  Administered 2020-03-21 – 2020-03-26 (×11): 1 via ORAL
  Filled 2020-03-20 (×14): qty 1

## 2020-03-20 MED ORDER — CALCIUM CARBONATE-VITAMIN D3 600-400 MG-UNIT PO TABS: 2.0000 | ORAL_TABLET | Freq: Two times a day (BID) | ORAL | Status: DC

## 2020-03-20 MED ORDER — SODIUM CHLORIDE 0.9 % IV SOLN: 250.0000 mL | INTRAVENOUS | Status: DC | PRN

## 2020-03-20 MED ORDER — DILTIAZEM HCL-DEXTROSE 125-5 MG/125ML-% IV SOLN (PREMIX)
5.0000 mg/h | INTRAVENOUS | Status: DC
  Administered 2020-03-20 – 2020-03-21 (×2): 5 mg/h via INTRAVENOUS
  Filled 2020-03-20 (×2): qty 125

## 2020-03-20 MED ORDER — ATORVASTATIN CALCIUM 10 MG PO TABS
10.0000 mg | ORAL_TABLET | Freq: Every day | ORAL | Status: DC
  Administered 2020-03-20 – 2020-03-26 (×7): 10 mg via ORAL
  Filled 2020-03-20 (×8): qty 1

## 2020-03-20 MED ORDER — VITAMIN D 25 MCG (1000 UNIT) PO TABS
1000.0000 [IU] | ORAL_TABLET | Freq: Every day | ORAL | Status: DC
  Administered 2020-03-20 – 2020-03-26 (×7): 1000 [IU] via ORAL
  Filled 2020-03-20 (×7): qty 1

## 2020-03-20 MED ORDER — DESMOPRESSIN ACETATE 0.2 MG PO TABS
600.0000 ug | ORAL_TABLET | Freq: Every day | ORAL | Status: DC
  Administered 2020-03-20 – 2020-03-24 (×5): 600 ug via ORAL
  Filled 2020-03-20 (×6): qty 3

## 2020-03-20 MED ORDER — DILTIAZEM HCL ER COATED BEADS 120 MG PO CP24
120.0000 mg | ORAL_CAPSULE | Freq: Once | ORAL | Status: AC
  Administered 2020-03-20: 120 mg via ORAL
  Filled 2020-03-20: qty 1

## 2020-03-20 MED ORDER — THYROID 60 MG PO TABS
60.0000 mg | ORAL_TABLET | Freq: Every day | ORAL | Status: DC
  Administered 2020-03-21 – 2020-03-26 (×6): 60 mg via ORAL
  Filled 2020-03-20 (×8): qty 1

## 2020-03-20 MED ORDER — SODIUM CHLORIDE 0.9% FLUSH: 3.0000 mL | INTRAVENOUS | Status: DC | PRN

## 2020-03-20 MED ORDER — SODIUM CHLORIDE 0.9% FLUSH
3.0000 mL | Freq: Once | INTRAVENOUS | Status: DC
Start: 2020-03-20 — End: 2020-03-27

## 2020-03-20 MED ORDER — SODIUM CHLORIDE 0.9 % IV BOLUS
500.0000 mL | Freq: Once | INTRAVENOUS | Status: AC
  Administered 2020-03-20: 500 mL via INTRAVENOUS

## 2020-03-20 NOTE — ED Notes (Signed)
Cynthia [name], pt's husband, [phone]

## 2020-03-20 NOTE — Plan of Care (Signed)

## 2020-03-20 NOTE — Telephone Encounter (Signed)
Orders need to be placed for home health care.

## 2020-03-20 NOTE — ED Triage Notes (Signed)
Pt to ED via [facility] from home, recent foot surgery bilateral, was attempting to get out of bed and slid to the floor. Husband called 20- when EMS got there, pt had a facial droop. Moves all extremities equally.

## 2020-03-20 NOTE — [place] FL2 (Signed)
MEDICAID FL2 LEVEL OF CARE SCREENING TOOL     IDENTIFICATION  Patient Name: Cynthia [name] Birthdate: 05-Aug-1940 Sex: female Admission Date (Current Location): 03/20/2020  [facility] and [place] Number:  Herbalist and Address:  [facility] [place], [place] [address] [place], [place] [address]      Provider Number: [id number]  Attending Physician Name and Address:  Oren Binet*  Relative Name and Phone Number:  Barrasso,[name] Spouse   [phone]    Current Level of Care: Hospital Recommended Level of Care: [place] Prior Approval Number:    Date Approved/Denied:   PASRR Number: [id number]  Discharge Plan: SNF    Current Diagnoses: Patient Active Problem List   Diagnosis Date Noted  . Atrial fibrillation with RVR ([place]) 03/20/2020  . Gait disturbance 01/29/2020  . Periodic limb movement disorder 08/30/2019  . Nocturia 07/01/2018  . Midline cystocele 06/08/2018  . Recurrent UTI 06/08/2018  . Vaginal vault prolapse 06/08/2018  . Lightheadedness 06/02/2018  . Near syncope   . Hypotension 03/17/2018  . Postural dizziness with presyncope 03/17/2018  . PAF (paroxysmal atrial fibrillation) ([place]) 03/17/2018  . Memory loss 11/25/2017  . Acute lower UTI 10/29/2017  . Abnormal stress test   . Chest pain 11/23/2016  . Hyperlipidemia 11/23/2016  . GERD (gastroesophageal reflux disease) 11/23/2016  . History of recurrent UTIs 11/23/2016  . Polyneuropathy 10/30/2016  . Bursitis, subacromial 10/30/2016  . Numbness 10/30/2016  . Arthritis, senescent 01/03/2016  . Left knee pain 01/03/2016  . Pain in joint, ankle and foot 02/28/2015  . Dry mouth 08/07/2014  . Dry eyes 08/07/2014  . Other headache syndrome 06/25/2014  . Sinusitis, chronic 06/25/2014  . Neck pain 06/25/2014  . OSA on CPAP 06/25/2014  . Essential hypertension, benign 06/25/2014    Orientation RESPIRATION BLADDER Height & Weight     Self, Time,  Situation, Place  Normal Incontinent, External catheter Weight: 145 lb 1 oz (65.8 kg) Height:  5\' 5"  (165.1 cm)  BEHAVIORAL SYMPTOMS/MOOD NEUROLOGICAL BOWEL NUTRITION STATUS      Continent Diet (heart healthy)  AMBULATORY STATUS COMMUNICATION OF NEEDS Skin   Extensive Assist Verbally Normal                       Personal Care Assistance Level of Assistance  Bathing, Feeding, Dressing Bathing Assistance: Limited assistance Feeding assistance: Independent Dressing Assistance: Maximum assistance     Functional Limitations Info  Sight, Hearing, Speech Sight Info: Adequate Hearing Info: Adequate Speech Info: Adequate    SPECIAL CARE FACTORS FREQUENCY  PT (By licensed PT), OT (By licensed OT)     PT Frequency: 5x weekly OT Frequency: 5x weekly            Contractures Contractures Info: Not present    Additional Factors Info  Code Status, Allergies Code Status Info: Full Allergies Info: Avelox,Alendronate Sodium           Current Medications (03/20/2020):  This is the current hospital active medication list Current Facility-Administered Medications  Medication Dose Route Frequency Provider Last Rate Last Admin  . 0.9 %  sodium chloride infusion  250 mL Intravenous PRN Bonnell Public Tublu, MD      . acetaminophen (TYLENOL) tablet 500 mg  500 mg Oral BID Bonnell Public Tublu, MD   500 mg at 03/20/20 1745  . apixaban (ELIQUIS) tablet 5 mg  5 mg Oral BID Vashti Hey, MD   5 mg at  03/20/20 1746  . atorvastatin (LIPITOR) tablet 10 mg  10 mg Oral Daily Bonnell Public Tublu, MD   10 mg at 03/20/20 1746  . calcium-vitamin D (OSCAL WITH D) 500-200 MG-UNIT per tablet 1 tablet  1 tablet Oral BID Bonnell Public Tublu, MD      . cholecalciferol (VITAMIN D3) tablet 1,000 Units  1,000 Units Oral Daily Vashti Hey, MD   1,000 Units at 03/20/20 1746  . cycloSPORINE (RESTASIS) 0.05 % ophthalmic emulsion 1 drop  1 drop Both Eyes BID  Bonnell Public Tublu, MD      . desmopressin (DDAVP) tablet 600 mcg  600 mcg Oral QHS Bonnell Public Tublu, MD      . diltiazem (CARDIZEM CD) 24 hr capsule 120 mg  120 mg Oral Daily Bonnell Public Tublu, MD   120 mg at 03/20/20 1745  . diltiazem (CARDIZEM) 125 mg in dextrose 5% 125 mL (1 mg/mL) infusion  5-15 mg/hr Intravenous Continuous Bonnell Public Tublu, MD 5 mL/hr at 03/20/20 1452 5 mg/hr at 03/20/20 1452  . DULoxetine (CYMBALTA) DR capsule 30 mg  30 mg Oral BID Bonnell Public Tublu, MD   30 mg at 03/20/20 1746  . ibuprofen (ADVIL) tablet 400 mg  400 mg Oral BID Bonnell Public Tublu, MD      . meclizine (ANTIVERT) tablet 25 mg  25 mg Oral Daily PRN Bonnell Public Tublu, MD      . melatonin tablet 6 mg  6 mg Oral QHS Bonnell Public Tublu, MD      . nitrofurantoin (MACRODANTIN) capsule 100 mg  100 mg Oral Daily Bonnell Public Tublu, MD      . oxyCODONE-acetaminophen (PERCOCET/ROXICET) 5-325 MG per tablet 1-2 tablet  1-2 tablet Oral Q4H PRN Bonnell Public Tublu, MD      . pantoprazole (PROTONIX) EC tablet 40 mg  40 mg Oral Daily Bonnell Public Tublu, MD      . polyethylene glycol (MIRALAX / GLYCOLAX) packet 17 g  17 g Oral Daily PRN Bonnell Public Tublu, MD      . potassium chloride SA (KLOR-CON) CR tablet 40 mEq  40 mEq Oral BID Bonnell Public Tublu, MD   40 mEq at 03/20/20 1745  . sodium chloride flush (NS) 0.9 % injection 3 mL  3 mL Intravenous Once Bonnell Public Tublu, MD      . sodium chloride flush (NS) 0.9 % injection 3 mL  3 mL Intravenous Q12H Bonnell Public Tublu, MD      . sodium chloride flush (NS) 0.9 % injection 3 mL  3 mL Intravenous PRN Vashti Hey, MD      . thyroid (ARMOUR) tablet 60 mg  60 mg Oral QAC breakfast Vashti Hey, MD       Current Outpatient Medications  Medication Sig Dispense Refill  . acetaminophen (TYLENOL) 500 MG tablet Take 500 mg by mouth 2 (two) times  daily.     . Alpha-Lipoic Acid (LIPOIC ACID PO) Take 2 tablets by mouth 2 (two) times daily.     Marland Kitchen atorvastatin (LIPITOR) 10 MG tablet TAKE 1 TABLET BY MOUTH EVERY DAY (Patient taking differently: Take 10 mg by mouth 2 (two) times daily. ) 90 tablet 3  . Calcium Carbonate-Vitamin D3 600-400 MG-UNIT TABS Take 1 tablet by mouth 2 (two) times a day.     . cholecalciferol (VITAMIN D) 1000 UNITS tablet Take 1,000 Units by mouth daily.    . cycloSPORINE (RESTASIS) 0.05 % ophthalmic emulsion Place 1 drop into both  eyes 2 (two) times daily.     Marland Kitchen desmopressin (DDAVP) 0.2 MG tablet Take 0.6 mg by mouth at bedtime.     Marland Kitchen diltiazem (CARDIZEM CD) 120 MG 24 hr capsule TAKE 1 CAPSULE BY MOUTH EVERY DAY (Patient taking differently: Take 120 mg by mouth daily. ) 90 capsule 1  . DULoxetine (CYMBALTA) 30 MG capsule TAKE 1 CAPSULE BY MOUTH TWICE A DAY (Patient taking differently: Take 30 mg by mouth daily. ) 180 capsule 3  . ELIQUIS 5 MG TABS tablet TAKE 1 TABLET BY MOUTH TWICE A DAY (Patient taking differently: Take 5 mg by mouth 2 (two) times daily. ) 60 tablet 5  . estradiol (ESTRACE) 0.1 MG/GM vaginal cream Place 1 Applicatorful vaginally at bedtime.    . fluticasone (FLONASE) 50 MCG/ACT nasal spray Place 1 spray into the nose daily as needed for allergies.     Marland Kitchen GLUCOSAMINE-CHONDROITIN PO Take 2 tablets by mouth daily with lunch.     . ibuprofen (ADVIL) 200 MG tablet Take 400 mg by mouth 2 (two) times daily.     . meclizine (ANTIVERT) 25 MG tablet Take 1 tablet by mouth daily as needed for dizziness.     . Melatonin 5 MG CAPS Take 5 mg by mouth at bedtime.     . Multiple Vitamin (MULTIVITAMIN WITH MINERALS) TABS tablet Take 1 tablet by mouth daily.    . nitrofurantoin (MACRODANTIN) 100 MG capsule Take 100 mg by mouth daily.     . nitroGLYCERIN (NITROLINGUAL) 0.4 MG/SPRAY spray Place 1 spray under the tongue every 5 (five) minutes x 3 doses as needed for chest pain. (Patient taking differently: Place 1 spray  under the tongue every 5 (five) minutes x 3 doses as needed for chest pain (max 3 doses). ) 12 g 1  . Omega-3 1000 MG CAPS Take 2,000 mg by mouth daily.     Marland Kitchen oxyCODONE-acetaminophen (PERCOCET) 5-325 MG tablet Take 1-2 tablets by mouth every 4 (four) hours as needed for severe pain. (Patient taking differently: Take 1 tablet by mouth every 8 (eight) hours as needed for severe pain. ) 30 tablet 0  . pantoprazole (PROTONIX) 40 MG tablet Take 40 mg by mouth daily before breakfast.   1  . thyroid (ARMOUR) 60 MG tablet Take 60 mg by mouth daily before breakfast.    . tiZANidine (ZANAFLEX) 4 MG tablet Take 1 tablet (4 mg total) by mouth 3 (three) times daily as needed. (Patient taking differently: Take 4 mg by mouth 3 (three) times daily as needed for muscle spasms. ) 90 tablet 5  . vitamin C (ASCORBIC ACID) 500 MG tablet Take 500 mg by mouth daily.     Marland Kitchen etodolac (LODINE XL) 400 MG 24 hr tablet Take 1 tablet by mouth twice daily. DO NOT TAKE WITH ANY OTHER NSAIDS (Patient not taking: Reported on 03/20/2020) 60 tablet 1  . ibuprofen (ADVIL) 800 MG tablet Take 1 tablet (800 mg total) by mouth every 6 (six) hours as needed. (Patient not taking: Reported on 03/20/2020) 60 tablet 1     Discharge Medications: Please see discharge summary for a list of discharge medications.  Relevant Imaging Results:  Relevant Lab Results:   Additional Information ssn# [id number]  Vergie Living, LCSW

## 2020-03-20 NOTE — Progress Notes (Signed)
FL2 Completed. Referrals faxed to several area SNFs

## 2020-03-20 NOTE — Progress Notes (Signed)
EDCSW met with Pt at bedside. Daughter, Malachy Mood was on speakerphone during assessment as well. Pt is pleasant 79 y/o woman, A&Ox4 who recently had foot surgery and has been experiencing falls. CSW will complete Fl2.  03/20/20 2005  TOC Assessment  Expected Discharge Plan [place]  Final next level of care Skilled Nursing Facility  Barriers to Discharge Continued Medical Work up  Patient states their goals for this hospitalization and ongoing recovery are: Get stronger at walking  Choice offered to / list presented to  [place]  Living arrangements for the past 2 months Single Family Home  Lives with: Spouse  Do you feel safe going back to the place where you live? Y  Permission sought to share information with  Facility Sport and exercise psychologist  Share Information with NAME Cutbirth,[name] Spouse   [phone]  Permission granted to share info w Relationship Lesperance,[name] Spouse   [phone]  Permission granted to share info w [place] Spouse   [phone]  Patient language and need for interpreter reviewed: Yes (not needed)  Criminal Activity/Legal Involvement Pertinent to Current Situation/Hospitalization No - Comment as needed  Need for Family Participation in Patient [place] giver support system in place? Y (husband, son, daughter and neighbor are all supports)  Appearance: Appears stated age  Attitude/Demeanor/Rapport Engaged  Affect (typically observed) Pleasant  Orientation:  Oriented to Self;Oriented to Place;Oriented to  Time;Oriented to Situation  Alcohol / Substance Use Not Applicable  Psych Involvement N

## 2020-03-20 NOTE — H&P (Signed)
History and Physical:    Cynthia [name]   KGU:[id number] DOB: 12/22/1940 DOA: 03/20/2020  Referring MD/provider:  Dr [name] PCP: Deland Pretty, MD   Patient coming from: Home  Chief Complaint: Altered mental status as well as RVR in a patient with known A. fib  History of Present Illness:   Cynthia [name] is an 79 y.o. female with PMH sig for Atrial fibrillation, exostosis of both feet status post surgery 2 days ago by podiatry on 03/18/2020, HL, GERD, OSA on CPAP, mild dementia and dizziness with gait abnormality presently being worked up for NPH was in her usual state of health until earlier this morning when she was noted to be confused by her family.  She apparently complained of significant pain in her feet with surgery, was given oxycodone and then 2 hours later was found sitting on the side of the bed with slurred speech and significant confusion.  Patient was called in as a code stroke to the ED.  Patient herself states she does remember what happened.  Notes "I was slipping and sliding on the side of the bed".  She states she is not confused at present other than her baseline memory loss.  She states "this is a part of who I am now".  Patient states she does remember coming into the ED.  She is aware that she is here due to the difficulty she had earlier with confusion and gait instability.  At present patient denies any chest pain, shortness of breath, dizziness.  She states she cannot tell that her heart is beating fast.  She thinks that she did take her diltiazem this morning but she may have forgotten is not really sure.  Patient states she did have an episode of chest pain a few weeks ago which was nitrate responsive.  Patient thinks that she has been able to get up and go to the bathroom at home since her surgery.  Daughter however is requesting placement in rehab as they are having difficulty managing her since her surgery and her difficulty with gait and pain.  Patient's  daughter however states that patient has been unable to use the bedside commode.  The daughter believes that patient forgets that she has had surgery and tries to get out of bed and immediately falls due to the pain which she does not remember that she has.    ED Course:  The patient was evaluated in the ED by neurology under code stroke.  Neurology felt this was more likely toxic metabolic encephalopathy most likely secondary to oxycodone and not using her CPAP overnight.  Emergent head CT was negative for any acute intracranial process.  Patient was however noted to be in RVR with heart rate of 150.  She responded well to diltiazem 10 mg IV with heart rate in the mid 90s however while she was being discharged her heart rate went back up to 150s.  Patient was asymptomatic throughout and she was able to maintain her blood pressure.  Patient is now admitted for management of difficult to control A. fib with RVR.  Of note patient has been off her Eliquis due to her recent foot surgery.  ROS:   ROS   Review of Systems: General: Denies fever, chills, malaise,  Respiratory: Denies cough, SOB at rest or hemoptysis Cardiovascular: Denies chest pain or palpitations GI: Denies nausea, vomiting, diarrhea or constipation GU: Denies dysuria, frequency or hematuria   Past Medical History:   Past Medical  History:  Diagnosis Date  . Acute blood loss anemia 10/27/2017  . Acute GI bleeding 10/27/2017  . Anemia   . Arthritis    "joints" (11/10/2017)  . Dizziness   . GERD (gastroesophageal reflux disease)   . GI bleed 11/06/2017  . Hearing loss   . History of blood transfusion 10/2017  . Hyperlipidemia   . Hyperthyroidism   . Melanoma ([place])    "cut off my back"  . Memory loss   . Sinus headache   . Sleep apnea     Past Surgical History:   Past Surgical History:  Procedure Laterality Date  . ABDOMINAL HYSTERECTOMY     "partial"  . APPENDECTOMY    . BIOPSY  11/17/2017   Procedure: BIOPSY;   Surgeon: Clarene Essex, MD;  Location: [facility];  Service: Endoscopy;;  . COLONOSCOPY WITH PROPOFOL N/A 11/17/2017   Procedure: COLONOSCOPY WITH PROPOFOL;  Surgeon: Clarene Essex, MD;  Location: [facility];  Service: Endoscopy;  Laterality: N/A;  . CRANIECTOMY FOR EXCISION OF ACOUSTIC NEUROMA    . ESOPHAGOGASTRODUODENOSCOPY (EGD) WITH PROPOFOL N/A 10/28/2017   Procedure: ESOPHAGOGASTRODUODENOSCOPY (EGD) WITH PROPOFOL;  Surgeon: Clarene Essex, MD;  Location: [facility];  Service: Endoscopy;  Laterality: N/A;  . KNEE ARTHROSCOPY Left   . LEFT HEART CATH AND CORONARY ANGIOGRAPHY N/A 11/24/2016   Procedure: LEFT HEART CATH AND CORONARY ANGIOGRAPHY;  Surgeon: Jettie Booze, MD;  Location: [facility];  Service: Cardiovascular;  Laterality: N/A;  . MELANOMA EXCISION     "off my back"  . NASAL SINUS SURGERY    . TONSILLECTOMY      Social History:   Social History   Socioeconomic History  . Marital status: Married    Spouse name: Not on file  . Number of children: 2  . Years of education: Not on file  . Highest education level: Not on file  Occupational History  . Not on file  Tobacco Use  . Smoking status: Former Smoker    Years: 2.00    Types: Cigarettes    Quit date: 1980    Years since quitting: 41.9  . Smokeless tobacco: Never Used  . Tobacco comment: 11/10/2017 "only smoked when I was on my period"  Vaping Use  . Vaping Use: Never used  Substance and Sexual Activity  . Alcohol use: Yes    Comment: 11/10/2017 "nothing lately; did have 1 glass of wine q night"  . Drug use: Never  . Sexual activity: Not Currently  Other Topics Concern  . Not on file  Social History Narrative   Right handed    Social Determinants of Health   Financial Resource Strain:   . Difficulty of Paying Living Expenses: Not on file  Food Insecurity:   . Worried About Charity fundraiser in the Last Year: Not on file  . Ran Out of Food in the Last Year: Not on file  Transportation Needs:    . Lack of Transportation (Medical): Not on file  . Lack of Transportation (Non-Medical): Not on file  Physical Activity:   . Days of Exercise per Week: Not on file  . Minutes of Exercise per Session: Not on file  Stress:   . Feeling of Stress : Not on file  Social Connections:   . Frequency of Communication with Friends and Family: Not on file  . Frequency of Social Gatherings with Friends and Family: Not on file  . Attends Religious Services: Not on file  . Active Member  of Clubs or Organizations: Not on file  . Attends Archivist Meetings: Not on file  . Marital Status: Not on file  Intimate Partner Violence:   . Fear of Current or Ex-Partner: Not on file  . Emotionally Abused: Not on file  . Physically Abused: Not on file  . Sexually Abused: Not on file    Allergies   Avelox [moxifloxacin hcl in nacl] and Alendronate sodium  Family history:   Family History  Problem Relation Age of Onset  . Congestive Heart Failure Mother   . Heart disease Father        History of heart attacks at a later age.    Marland Kitchen Heart disease Brother   . Hearing loss Brother     Current Medications:   Prior to Admission medications   Medication Sig Start Date End Date Taking? Authorizing Provider  acetaminophen (TYLENOL) 500 MG tablet Take 500 mg by mouth 2 (two) times daily.    Yes [provider]  Alpha-Lipoic Acid (LIPOIC ACID PO) Take 2 tablets by mouth 2 (two) times daily.    Yes [provider]  atorvastatin (LIPITOR) 10 MG tablet TAKE 1 TABLET BY MOUTH EVERY DAY Patient taking differently: Take 10 mg by mouth 2 (two) times daily.  04/25/19  Yes Adrian Prows, MD  Calcium Carbonate-Vitamin D3 600-400 MG-UNIT TABS Take 1 tablet by mouth 2 (two) times a day.    Yes [provider]  cholecalciferol (VITAMIN D) 1000 UNITS tablet Take 1,000 Units by mouth daily.   Yes [provider]  cycloSPORINE (RESTASIS) 0.05 % ophthalmic emulsion Place 1 drop into  both eyes 2 (two) times daily.    Yes [provider]  desmopressin (DDAVP) 0.2 MG tablet Take 0.6 mg by mouth at bedtime.  12/11/19  Yes [provider]  diltiazem (CARDIZEM CD) 120 MG 24 hr capsule TAKE 1 CAPSULE BY MOUTH EVERY DAY Patient taking differently: Take 120 mg by mouth daily.  02/22/20  Yes Adrian Prows, MD  DULoxetine (CYMBALTA) 30 MG capsule TAKE 1 CAPSULE BY MOUTH TWICE A DAY Patient taking differently: Take 30 mg by mouth daily.  12/12/19  Yes Sater, Nanine Means, MD  ELIQUIS 5 MG TABS tablet TAKE 1 TABLET BY MOUTH TWICE A DAY Patient taking differently: Take 5 mg by mouth 2 (two) times daily.  03/11/20  Yes Adrian Prows, MD  estradiol (ESTRACE) 0.1 MG/GM vaginal cream Place 1 Applicatorful vaginally at bedtime.   Yes [provider]  fluticasone (FLONASE) 50 MCG/ACT nasal spray Place 1 spray into the nose daily as needed for allergies.    Yes [provider]  GLUCOSAMINE-CHONDROITIN PO Take 2 tablets by mouth daily with lunch.    Yes [provider]  ibuprofen (ADVIL) 200 MG tablet Take 400 mg by mouth 2 (two) times daily.    Yes [provider]  meclizine (ANTIVERT) 25 MG tablet Take 1 tablet by mouth daily as needed for dizziness.    Yes [provider]  Melatonin 5 MG CAPS Take 5 mg by mouth at bedtime.    Yes [provider]  Multiple Vitamin (MULTIVITAMIN WITH MINERALS) TABS tablet Take 1 tablet by mouth daily.   Yes [provider]  nitrofurantoin (MACRODANTIN) 100 MG capsule Take 100 mg by mouth daily.  02/08/20  Yes [provider]  nitroGLYCERIN (NITROLINGUAL) 0.4 MG/SPRAY spray Place 1 spray under the tongue every 5 (five) minutes x 3 doses as needed for chest pain. Patient  taking differently: Place 1 spray under the tongue every 5 (five) minutes x 3 doses as needed for chest pain (max 3 doses).  02/02/20  Yes Adrian Prows, MD  Omega-3 1000 MG CAPS Take 2,000 mg by mouth daily.    Yes  [provider]  oxyCODONE-acetaminophen (PERCOCET) 5-325 MG tablet Take 1-2 tablets by mouth every 4 (four) hours as needed for severe pain. Patient taking differently: Take 1 tablet by mouth every 8 (eight) hours as needed for severe pain.  03/18/20  Yes Felipa Furnace, DPM  pantoprazole (PROTONIX) 40 MG tablet Take 40 mg by mouth daily before breakfast.  06/12/14  Yes [provider]  thyroid (ARMOUR) 60 MG tablet Take 60 mg by mouth daily before breakfast. 11/07/17  Yes [provider]  tiZANidine (ZANAFLEX) 4 MG tablet Take 1 tablet (4 mg total) by mouth 3 (three) times daily as needed. Patient taking differently: Take 4 mg by mouth 3 (three) times daily as needed for muscle spasms.  02/09/19  Yes Sater, Nanine Means, MD  vitamin C (ASCORBIC ACID) 500 MG tablet Take 500 mg by mouth daily.    Yes [provider]  etodolac (LODINE XL) 400 MG 24 hr tablet Take 1 tablet by mouth twice daily. DO NOT TAKE WITH ANY OTHER NSAIDS Patient not taking: Reported on 03/20/2020 03/15/20   Sater, Nanine Means, MD  ibuprofen (ADVIL) 800 MG tablet Take 1 tablet (800 mg total) by mouth every 6 (six) hours as needed. Patient not taking: Reported on 03/20/2020 03/18/20   Felipa Furnace, DPM    Physical Exam:   Vitals:   03/20/20 1330 03/20/20 1345 03/20/20 1400 03/20/20 1415  BP: 133/86 (!) 148/94 (!) 158/108 (!) 172/93  Pulse: (!) 143 (!) 59 (!) 134 (!) 159  Resp: 19 (!) 26 (!) 32 18  Temp:      TempSrc:      SpO2: 91% 93% (!) 89% 94%  Weight:      Height:         Physical Exam: Blood pressure (!) 172/93, pulse (!) 159, temperature 99.7 F (37.6 C), temperature source Oral, resp. rate 18, height 5\' 5"  (1.651 m), weight 65.8 kg, SpO2 94 %. Gen: Well-nourished pleasant female sitting up in bed in no acute distress. Eyes: sclera anicteric, conjuctiva mildly injected bilaterally CVS: S1-S2, regular, no gallops Respiratory:  decreased air entry likely secondary to decreased  inspiratory effort GI: NABS, soft, NT  LE: Patient has feet in bilateral boots with CDI.  Right calf is somewhat bigger than left calf although patient states this is her baseline since right knee has been bothering her. Neuro: Patient with mild dementia but is grossly nonfocal.  Psych: mood and affect appropriate to situation.   Data Review:    Labs: Basic Metabolic Panel: Recent Labs  Lab 03/20/20 0849 03/20/20 0850  NA 138 141  K 3.2* 3.2*  CL 101 100  CO2 28  --   GLUCOSE 115* 109*  BUN 21 25*  CREATININE 0.65 0.50  CALCIUM 8.8*  --    Liver Function Tests: Recent Labs  Lab 03/20/20 0849  AST 26  ALT 18  ALKPHOS 41  BILITOT 0.8  PROT 6.0*  ALBUMIN 3.3*   No results for input(s): LIPASE, AMYLASE in the last 168 hours. No results for input(s): AMMONIA in the last 168 hours. CBC: Recent Labs  Lab 03/20/20 0849 03/20/20 0850  WBC 9.9  --   NEUTROABS 6.9  --  HGB 11.7* 12.6  HCT 36.7 37.0  MCV 95.1  --   PLT 203  --    Cardiac Enzymes: No results for input(s): CKTOTAL, CKMB, CKMBINDEX, TROPONINI in the last 168 hours.  BNP (last 3 results) No results for input(s): PROBNP in the last 8760 hours. CBG: Recent Labs  Lab 03/20/20 0844  GLUCAP 109*    Urinalysis    Component Value Date/Time   COLORURINE YELLOW 03/20/2020 0917   APPEARANCEUR HAZY (A) 03/20/2020 0917   LABSPEC 1.016 03/20/2020 0917   PHURINE 7.0 03/20/2020 0917   GLUCOSEU NEGATIVE 03/20/2020 0917   HGBUR NEGATIVE 03/20/2020 0917   BILIRUBINUR NEGATIVE 03/20/2020 0917   KETONESUR 5 (A) 03/20/2020 0917   PROTEINUR NEGATIVE 03/20/2020 0917   NITRITE NEGATIVE 03/20/2020 0917   LEUKOCYTESUR NEGATIVE 03/20/2020 0917      Radiographic Studies: CT HEAD CODE STROKE WO CONTRAST  Result Date: 03/20/2020 CLINICAL DATA:  Code stroke.  Neuro deficit, acute, stroke suspected EXAM: CT HEAD WITHOUT CONTRAST TECHNIQUE: Contiguous axial images were obtained from the base of the skull through  the vertex without intravenous contrast. COMPARISON:  01/24/2020 and prior. FINDINGS: Brain: No acute infarct or intracranial hemorrhage. No mass lesion. No midline shift, ventriculomegaly or extra-axial fluid collection. Mild cerebral atrophy with ex vacuo dilatation. Chronic microvascular ischemic changes. Vascular: No hyperdense vessel or unexpected calcification. Minimal carotid siphon atherosclerotic calcifications. Skull: No acute finding. Sequela of prior left occipital craniectomy. Sinuses/Orbits: No acute orbital finding. Sequela of chronic left maxillary and right sphenoid sinus disease. Other: None. ASPECTS [facility] Stroke Program Early CT Score) - Ganglionic level infarction (caudate, lentiform nuclei, internal capsule, insula, M1-M3 cortex): 7 - Supraganglionic infarction (M4-M6 cortex): 3 Total score (0-10 with 10 being normal): 10 IMPRESSION: 1. No acute intracranial process. 2. Mild cerebral atrophy and chronic microvascular ischemic changes. 3. ASPECTS is 10 Code stroke imaging results were communicated on 03/20/2020 at 9:01 am to provider Dr. Leonel Ramsay via secure text paging. Electronically Signed   By: Primitivo Gauze M.D.   On: 03/20/2020 09:06    EKG: Independently reviewed.  Atrial fibrillation at 160.  LAD at -58.  Some T wave depressions in 2.  Poor baseline.   Assessment/Plan:   Principal Problem:   Atrial fibrillation with RVR (HCC) Active Problems:   OSA on CPAP   Essential hypertension, benign   GERD (gastroesophageal reflux disease)   History of recurrent UTIs   Memory loss   Postural dizziness with presyncope   Gait disturbance   79 year old female with mild dementia, being worked up for NPH given gait instability who is 2 days status post surgery of bilateral exostosis of her feet was brought to ED today for confusion after getting oxycodone and found to be in A. fib with RVR which was not able to be managed in the ED.  Atrial fibrillation with RVR Patient  had initially responded well to Cardizem 10 mg IV however heart rate increased back up to the 150s Patient is entirely asymptomatic with this with no chest pain shortness of breath or dizziness Will give another 10 mg dose of IV Cardizem and start patient on Cardizem drip It does not seem that patient took her Cardizem this morning, will give her usual dose of Cardizem 120 mg p.o. x1 now and titrate off drip as tolerated over the course of the day. Eliquis 5 mg p.o. twice daily to be restarted  Hypokalemia We will replete and recheck in the morning  Difficulty with ambulation status post  removal of exostosis 2 days ago 03/21/2020 Family states they are having difficulty managing patient at home due to inability to walk from pain given recent bilateral foot surgery and confusion with the oxycodone for pain management. They are hoping that she can be placed until she is able to be more ambulatory PT consult requested TOC consult placed  OSA Continue home doses of CPAP  GERD Continue pantoprazole  Hypocalcemia Mild, asymptomatic, ionized calcium 1.13 Continue new home doses of calcium carbonate and vitamin D  Mild dementia Patient is not on any antidementia medications at home  Undergoing outpatient work-up for NPH Patient with clear gait instability, will place patient on bedrest until PT can see patient High falls risk Further work-up per neurology as an outpatient  Hypothyroidism Continue Armour tablet    Other information:   DVT prophylaxis: Eliquis ordered. Code Status: Full Family Communication: Spoke with patient's daughter Cynthia Merl Disposition Plan: TBD Consults called: Neurology was consulted in the ED, no further neurology input is warranted Admission status: Observation  Pax Reasoner Tublu Cynthia [name] [facility]  If 7PM-7AM, please contact night-coverage [email or web address] Password [name] 03/20/2020, 2:43 PM

## 2020-03-20 NOTE — Telephone Encounter (Signed)
Pt called today wanting an update on home health care order. I informed the patient that the Home Healthcare order has been placed. We could fax over all the information to [facility]. This pt was admitted to the hospital today for Code Stroke. I advised the pt husband to speak with the social worker prior to discharge to see if they could set up SNF/ 24 hour assistance to help with this patient. After speaking with the the pt husband the pt would need 24 hour care.

## 2020-03-20 NOTE — Evaluation (Signed)
Physical Therapy Evaluation Patient Details Name: Cynthia [name] MRN: [id number] DOB: [birth year] Today's Date: 03/20/2020   History of Present Illness  Pt is a 79 y/o female admitted secondary to AMS and inability to ambulate. Pt with recent bilateral foot callous removal. PMH includes a fib.   Clinical Impression  Pt admitted secondary to problem above with deficits below. Pt requiring mod A for bed mobility. Attempted to stand, however, pt with too much pain in bilateral feet. Feel pt is at increased risk for falls and would benefit from SNF at d/c. Will continue to folow acutely.     Follow Up Recommendations SNF;Supervision/Assistance - 24 hour    Equipment Recommendations  Wheelchair cushion (measurements PT);Wheelchair (measurements PT);Other (comment) (hoyer and hoyer lift)    Recommendations for Other Services       Precautions / Restrictions Precautions Precautions: Fall Required Braces or Orthoses: Other Brace Other Brace: bilateral post op shoes Restrictions Weight Bearing Restrictions: Yes RLE Weight Bearing: Weight bearing as tolerated LLE Weight Bearing: Weight bearing as tolerated      Mobility  Bed Mobility Overal bed mobility: Needs Assistance Bed Mobility: Supine to Sit;Sit to Supine     Supine to sit: Mod assist Sit to supine: Mod assist   General bed mobility comments: Assist for LEs and trunk    Transfers                 General transfer comment: Attempted to stand, however, pt was unable to clear hips from stretcher. Difficulty bearing weight bilaterally secondary to pain.  Ambulation/Gait                Stairs            Wheelchair Mobility    Modified Rankin (Stroke Patients Only)       Balance Overall balance assessment: Needs assistance Sitting-balance support: No upper extremity supported;Feet supported Sitting balance-Leahy Scale: Fair                                       Pertinent  Vitals/Pain Pain Assessment: Faces Faces Pain Scale: Hurts whole lot Pain Location: bilateral feet and R knee  Pain Descriptors / Indicators: Grimacing;Guarding;Discomfort Pain Intervention(s): Limited activity within patient's tolerance;Monitored during session;Repositioned    Home Living Family/patient expects to be discharged to:: Private residence Living Arrangements: Spouse/significant other Available Help at Discharge: Family Type of Home: House Home Access: Stairs to enter Entrance Stairs-Rails: None Technical brewer of Steps: 2 Home Layout: One level Home Equipment: Environmental consultant - 2 wheels      Prior Function Level of Independence: Needs assistance   Gait / Transfers Assistance Needed: Has been requiring assist to ambulate and to perform steps with use of RW.   ADL's / Homemaking Assistance Needed: Needed assist for ADLs since recent foot surgery.         Hand Dominance        Extremity/Trunk Assessment   Upper Extremity Assessment Upper Extremity Assessment: Overall WFL for tasks assessed    Lower Extremity Assessment Lower Extremity Assessment: RLE deficits/detail;Generalized weakness RLE Deficits / Details: Increased R knee pain which limited ROM    Cervical / Trunk Assessment Cervical / Trunk Assessment: Kyphotic  Communication   Communication: No difficulties  Cognition Arousal/Alertness: Awake/alert Behavior During Therapy: WFL for tasks assessed/performed Overall Cognitive Status: No family/caregiver present to determine baseline cognitive functioning  General Comments      Exercises     Assessment/Plan    PT Assessment Patient needs continued PT services  PT Problem List Decreased strength;Decreased mobility;Decreased balance;Decreased activity tolerance;Decreased knowledge of use of DME;Pain       PT Treatment Interventions DME instruction;Gait training;Functional mobility  training;Therapeutic exercise;Therapeutic activities;Balance training;Patient/family education    PT Goals (Current goals can be found in the Care Plan section)  Acute Rehab PT Goals Patient Stated Goal: to be able to walk better PT Goal Formulation: With patient Time For Goal Achievement: 04/03/20 Potential to Achieve Goals: Good    Frequency Min 2X/week   Barriers to discharge        Co-evaluation               AM-PAC PT "6 Clicks" Mobility  Outcome Measure Help needed turning from your back to your side while in a flat bed without using bedrails?: A Lot Help needed moving from lying on your back to sitting on the side of a flat bed without using bedrails?: A Lot Help needed moving to and from a bed to a chair (including a wheelchair)?: Total Help needed standing up from a chair using your arms (e.g., wheelchair or bedside chair)?: Total Help needed to walk in hospital room?: Total Help needed climbing 3-5 steps with a railing? : Total 6 Click Score: 8    End of Session Equipment Utilized During Treatment: Gait belt Activity Tolerance: Patient limited by pain Patient left: in bed;with call bell/phone within reach (on stretcher in ED ) Nurse Communication: Mobility status PT Visit Diagnosis: Other abnormalities of gait and mobility (R26.89);Difficulty in walking, not elsewhere classified (R26.2);Pain;Unsteadiness on feet (R26.81);Muscle weakness (generalized) (M62.81) Pain - Right/Left:  (bilateral) Pain - part of body: Ankle and joints of foot    Time: 1035-1050 PT Time Calculation (min) (ACUTE ONLY): 15 min   Charges:   PT Evaluation $PT Eval Moderate Complexity: 1 Mod          Reuel Derby, PT, DPT  Acute Rehabilitation Services  Pager: [phone] Office: [phone]   Rudean Hitt 03/20/2020, 1:21 PM

## 2020-03-20 NOTE — Discharge Planning (Signed)
RNCM consulted regarding safe discharge planning (Home with Home Health vs Skilled Nursing Facility Placement).  Physical Therapy evaluation placed; will follow up after recommendations from PT.     

## 2020-03-20 NOTE — Code Documentation (Signed)
Stroke Response Nurse Documentation Code Documentation  Cynthia [name] is a 79 y.o. female arriving to [place] ED via [facility] on 03/20/2020 with past medical hx of atrial fibrillation on Eliquis and recent bilateral foot surgery. Code stroke was activated by EMS. Patient from home where she was LKW at 0400 and now complaining of right facial droop, aphasia, and altered mental status. On Eliquis (apixaban) daily. Stroke team at the bedside on patient arrival. Labs drawn and patient cleared for CT by Dr. [name]. Patient to CT with team. NIHSS 0, see documentation for details and code stroke times. The following imaging was completed: CT. Patient is not a candidate for tPA due to symptoms having resolved. Code stroke cancelled.  Care/Plan: Code stroke cancelled.  Bedside handoff with ED RN Marolyn Hammock L Shuntel Fishburn  Rapid Response RN

## 2020-03-20 NOTE — ED Notes (Signed)
Attempted to call report. Inpt nurse unavailable at this time-- will call for report when available.

## 2020-03-20 NOTE — ED Provider Notes (Signed)
[facility] Emergency Department Provider Note MRN:  [id number]  Arrival date & time: 03/20/20     Chief Complaint   Fall, code stroke History of Present Illness   Cynthia [name] is a 79 y.o. year-old female with a history of A. fib presenting to the ED with chief complaint of fall.  Patient recovering from bilateral foot surgery, woke up at 4 AM with some pain, was given oxycodone by family member. [place] out of bed at 6 AM, was found to have slurred speech and was somewhat confused. EMS was called, code stroke initiated in the field. Patient denies numbness or weakness to the arms or legs, denies any significant pain or injuries from the fall. Largely without complaints. Family is concerned that given her recent surgery she is not receiving the care she needs at home.  Review of Systems  A complete 10 system review of systems was obtained and all systems are negative except as noted in the HPI and PMH.   Patient's Health History    Past Medical History:  Diagnosis Date  . Acute blood loss anemia 10/27/2017  . Acute GI bleeding 10/27/2017  . Anemia   . Arthritis    "joints" (11/10/2017)  . Dizziness   . GERD (gastroesophageal reflux disease)   . GI bleed 11/06/2017  . Hearing loss   . History of blood transfusion 10/2017  . Hyperlipidemia   . Hyperthyroidism   . Melanoma ([place])    "cut off my back"  . Memory loss   . Sinus headache   . Sleep apnea     Past Surgical History:  Procedure Laterality Date  . ABDOMINAL HYSTERECTOMY     "partial"  . APPENDECTOMY    . BIOPSY  11/17/2017   Procedure: BIOPSY;  Surgeon: Clarene Essex, MD;  Location: [facility];  Service: Endoscopy;;  . COLONOSCOPY WITH PROPOFOL N/A 11/17/2017   Procedure: COLONOSCOPY WITH PROPOFOL;  Surgeon: Clarene Essex, MD;  Location: [facility];  Service: Endoscopy;  Laterality: N/A;  . CRANIECTOMY FOR EXCISION OF ACOUSTIC NEUROMA    . ESOPHAGOGASTRODUODENOSCOPY (EGD) WITH PROPOFOL N/A  10/28/2017   Procedure: ESOPHAGOGASTRODUODENOSCOPY (EGD) WITH PROPOFOL;  Surgeon: Clarene Essex, MD;  Location: [facility];  Service: Endoscopy;  Laterality: N/A;  . KNEE ARTHROSCOPY Left   . LEFT HEART CATH AND CORONARY ANGIOGRAPHY N/A 11/24/2016   Procedure: LEFT HEART CATH AND CORONARY ANGIOGRAPHY;  Surgeon: Jettie Booze, MD;  Location: [place] [facility];  Service: Cardiovascular;  Laterality: N/A;  . MELANOMA EXCISION     "off my back"  . NASAL SINUS SURGERY    . TONSILLECTOMY      Family History  Problem Relation Age of Onset  . Congestive Heart Failure Mother   . Heart disease Father        History of heart attacks at a later age.    Marland Kitchen Heart disease Brother   . Hearing loss Brother     Social History   Socioeconomic History  . Marital status: Married    Spouse name: Not on file  . Number of children: 2  . Years of education: Not on file  . Highest education level: Not on file  Occupational History  . Not on file  Tobacco Use  . Smoking status: Former Smoker    Years: 2.00    Types: Cigarettes    Quit date: 1980    Years since quitting: 41.9  . Smokeless tobacco: Never Used  . Tobacco comment: 11/10/2017 "  only smoked when I was on my period"  Vaping Use  . Vaping Use: Never used  Substance and Sexual Activity  . Alcohol use: Yes    Comment: 11/10/2017 "nothing lately; did have 1 glass of wine q night"  . Drug use: Never  . Sexual activity: Not Currently  Other Topics Concern  . Not on file  Social History Narrative   Right handed    Social Determinants of Health   Financial Resource Strain:   . Difficulty of Paying Living Expenses: Not on file  Food Insecurity:   . Worried About Charity fundraiser in the Last Year: Not on file  . Ran Out of Food in the Last Year: Not on file  Transportation Needs:   . Lack of Transportation (Medical): Not on file  . Lack of Transportation (Non-Medical): Not on file  Physical Activity:   . Days of Exercise per  Week: Not on file  . Minutes of Exercise per Session: Not on file  Stress:   . Feeling of Stress : Not on file  Social Connections:   . Frequency of Communication with Friends and Family: Not on file  . Frequency of Social Gatherings with Friends and Family: Not on file  . Attends Religious Services: Not on file  . Active Member of Clubs or Organizations: Not on file  . Attends Archivist Meetings: Not on file  . Marital Status: Not on file  Intimate Partner Violence:   . Fear of Current or Ex-Partner: Not on file  . Emotionally Abused: Not on file  . Physically Abused: Not on file  . Sexually Abused: Not on file     Physical Exam   Vitals:   03/20/20 1300 03/20/20 1330  BP: 125/76 133/86  Pulse: 92 (!) 143  Resp: 18 19  Temp:    SpO2: 92% 91%    CONSTITUTIONAL: Chronically ill-appearing, NAD NEURO:  Alert and oriented x 3, normal and symmetric strength and sensation, normal coordination, normal speech EYES:  eyes equal and reactive ENT/NECK:  no LAD, no JVD CARDIO: Regular rate, well-perfused, normal S1 and S2 PULM:  CTAB no wheezing or rhonchi GI/GU:  normal bowel sounds, non-distended, non-tender MSK/SPINE:  No gross deformities, no edema, dressings in place bilateral feet SKIN:  no rash, atraumatic PSYCH:  Appropriate speech and behavior  *Additional and/or pertinent findings included in MDM below  Diagnostic and Interventional Summary    EKG Interpretation  Date/Time:  Wednesday March 20 2020 09:37:37 EST Ventricular Rate:  159 PR Interval:    QRS Duration: 102 QT Interval:  282 QTC Calculation: 463 R Axis:   -34 Text Interpretation: Atrial fibrillation with rapid V-rate Left axis deviation Repolarization abnormality, prob rate related Artifact in lead(s) I III aVR aVL Confirmed by Gerlene Fee [phone]) on 03/20/2020 10:40:16 AM      Labs Reviewed  CBC - Abnormal; Notable for the following components:      Result Value   RBC 3.86 (*)     Hemoglobin 11.7 (*)    All other components within normal limits  DIFFERENTIAL - Abnormal; Notable for the following components:   Monocytes Absolute 1.2 (*)    All other components within normal limits  COMPREHENSIVE METABOLIC PANEL - Abnormal; Notable for the following components:   Potassium 3.2 (*)    Glucose, Bld 115 (*)    Calcium 8.8 (*)    Total Protein 6.0 (*)    Albumin 3.3 (*)    All other  components within normal limits  URINALYSIS, ROUTINE W REFLEX MICROSCOPIC - Abnormal; Notable for the following components:   APPearance HAZY (*)    Ketones, ur 5 (*)    All other components within normal limits  CBG MONITORING, ED - Abnormal; Notable for the following components:   Glucose-Capillary 109 (*)    All other components within normal limits  I-STAT CHEM 8, ED - Abnormal; Notable for the following components:   Potassium 3.2 (*)    BUN 25 (*)    Glucose, Bld 109 (*)    Calcium, Ion 1.13 (*)    All other components within normal limits  RESP PANEL BY RT-PCR (FLU A&B, COVID) ARPGX2  PROTIME-INR  APTT  CBG MONITORING, ED    CT HEAD CODE STROKE WO CONTRAST  Final Result      Medications  sodium chloride flush (NS) 0.9 % injection 3 mL (has no administration in time range)  atorvastatin (LIPITOR) tablet 10 mg (has no administration in time range)  desmopressin (DDAVP) tablet 600 mcg (has no administration in time range)  diltiazem (CARDIZEM CD) 24 hr capsule 120 mg (has no administration in time range)  DULoxetine (CYMBALTA) DR capsule 30 mg (has no administration in time range)  apixaban (ELIQUIS) tablet 5 mg (has no administration in time range)  oxyCODONE-acetaminophen (PERCOCET/ROXICET) 5-325 MG per tablet 1-2 tablet (has no administration in time range)  pantoprazole (PROTONIX) EC tablet 40 mg (has no administration in time range)  thyroid (ARMOUR) tablet 60 mg (has no administration in time range)  diltiazem (CARDIZEM) injection 10 mg (has no administration in time  range)  diltiazem (CARDIZEM) 125 mg in dextrose 5% 125 mL (1 mg/mL) infusion (has no administration in time range)  diltiazem (CARDIZEM CD) 24 hr capsule 120 mg (120 mg Oral Given 03/20/20 1116)  diltiazem (CARDIZEM) injection 10 mg (10 mg Intravenous Given 03/20/20 0949)  sodium chloride 0.9 % bolus 500 mL (500 mLs Intravenous New Bag/Given 03/20/20 0955)     Procedures  /  Critical Care .Critical Care Performed by: Maudie Flakes, MD Authorized by: Maudie Flakes, MD   Critical care provider statement:    Critical care time (minutes):  38   Critical care was necessary to treat or prevent imminent or life-threatening deterioration of the following conditions: Concern for acute ischemic stroke, initiation of code stroke protocol, A. fib with RVR.   Critical care was time spent personally by me on the following activities:  Discussions with consultants, evaluation of patient's response to treatment, examination of patient, ordering and performing treatments and interventions, ordering and review of laboratory studies, ordering and review of radiographic studies, pulse oximetry, re-evaluation of patient's condition, obtaining history from patient or surrogate and review of old charts    ED Course and Medical Decision Making  I have reviewed the triage vital signs, the nursing notes, and pertinent available records from the EMR.  Listed above are laboratory and imaging tests that I personally ordered, reviewed, and interpreted and then considered in my medical decision making (see below for details).  Nonfocal exam, normal vital signs, patient's speech is now normal. Recitation it is best explained by side effect of oxycodone. CT head is normal. Evaluated by Dr. Leonel Ramsay of neurology, code stroke is canceled. We will proceed with remainder of medical/trauma work-up. Patient may need case management evaluation and/or placement for rehab. Clinical Course as of Mar 20 1418  Wed Mar 20, 2020   1052 Heart rate is improved to the 80s-90s  after single dose of IV diltiazem.  She is about to receive her oral diltiazem.  We will continue to monitor.  If she is rate controlled on her normal medicines she will be medically cleared.  She is currently being evaluated by physical therapy.  May need rehab placement.  Awaiting case management recommendations.   [MB]    Clinical Course User Index [MB] Maudie Flakes, MD     Patient is exhibiting heart rate of 140-160, looks like A. fib versus flutter.  She has not taking her morning medications.  Providing small dose IV diltiazem while we wait for her home dose of 120 mg 24-hour diltiazem tablet.  She has no chest pain or shortness of breath, no evidence of leg pain or swelling, hemodynamically stable, suspect this is related to missing her home dose.  Patient had rate control for a few hours but has unfortunately returned to RVR with a rate of 150 despite home medication.  Will admit to hospital service for further rate control.  Her anticoagulation was paused for a few days in the setting of her recent surgery and so felt to be a poor cardioversion candidate at this time.  Barth Kirks. [name], [place] [email or web address]  Final Clinical Impressions(s) / ED Diagnoses     ICD-10-CM   1. Atrial fibrillation with RVR ([name])  I48.91     ED Discharge Orders    None       Discharge Instructions Discussed with and Provided to Patient:   Discharge Instructions   None       Maudie Flakes, MD 03/20/20 1420

## 2020-03-20 NOTE — Consult Note (Addendum)
Neurology Consultation Reason for Consult: Altered mental status Referring Physician: Code stroke Cynthia [name], M attending)  CC: Could not get back into bed  History is obtained from: Patient  HPI: Cynthia [name] is a 79 y.o. female with a recent bilateral foot surgery who presents with altered mental status that was noted this morning.  At 4 AM the patient was in a significant amount of pain and therefore her husband gave her oxycodone.  Couple of hours later, he heard her call out, and found her on the floor unable to get up.  She was acting inappropriately, laughing and saying "let us treat this as an adventure."  Due to inappropriate statements such as this, EMS was concerned for possible aphasia and therefore activated code stroke.   LKW: 4 AM tpa given?: no, not a stroke   ROS: A 14 point ROS was performed and is negative except as noted in the HPI.   Past Medical History:  Diagnosis Date  . Acute blood loss anemia 10/27/2017  . Acute GI bleeding 10/27/2017  . Anemia   . Arthritis    "joints" (11/10/2017)  . Dizziness   . GERD (gastroesophageal reflux disease)   . GI bleed 11/06/2017  . Hearing loss   . History of blood transfusion 10/2017  . Hyperlipidemia   . Hyperthyroidism   . Melanoma ([place])    "cut off my back"  . Memory loss   . Sinus headache   . Sleep apnea      Family History  Problem Relation Age of Onset  . Congestive Heart Failure Mother   . Heart disease Father        History of heart attacks at a later age.    Marland Kitchen Heart disease Brother   . Hearing loss Brother      Social History:  reports that she quit smoking about 41 years ago. Her smoking use included cigarettes. She quit after 2.00 years of use. She has never used smokeless tobacco. She reports current alcohol use. She reports that she does not use drugs.   Exam: Current vital signs: There were no vitals filed for this visit.  Vital signs in last 24 hours:     Physical Exam  Constitutional:  Appears elderly Psych: Affect appropriate to situation Eyes: No scleral injection HENT: No OP obstrucion MSK: no joint deformities.  Cardiovascular: Normal rate and regular rhythm.  Respiratory: Effort normal, non-labored breathing GI: Soft.  No distension. There is no tenderness.  Skin: WDI  Neuro: Mental Status: Patient is awake, alert, oriented to person, place, month, year, and situation. Patient is able to give a clear and coherent history. No signs of aphasia or neglect Her mouth is dry, but no definite dysarthria.  Cranial Nerves: II: Visual Fields are full. Pupils are equal, round, and reactive to light.   III,IV, VI: EOMI without ptosis or diploplia.  V: Facial sensation is symmetric to temperature VII: Facial movement is symmetric.  VIII: hearing is intact to voice X: Uvula elevates symmetrically XI: Shoulder shrug is symmetric. XII: tongue is midline without atrophy or fasciculations.  Motor: Tone is normal. Bulk is normal. 5/5 strength was present in all four extremities.  Sensory: Sensation is symmetric to light touch and temperature in the arms and legs. Cerebellar: FNF and HKS are intact bilaterally   I have reviewed labs in epic and the results pertinent to this consultation are: CBG 109  I have reviewed the images obtained:CT head - negative  Impression: 79 year old female  with inappropriate statements and laughter after taking oxycodone.  Also of note, she normally wears a CPAP and did not wear it last night which may have been contributing to some confusion.  At this point her exam is nonfocal, and from description it sounds more like medication effect than a focal neurological syndrome.  Also possible contribution from lack of CPAP as well as A. fib with RVR.  We will continue medical work-up with CMP, and defer further management to be ER physicians.   No further work-up is needed at this time.  Recommendations: 1) initially recommended a stat head CT  to rule out intracranial hemorrhage in this patient on Eliquis, is finalized at the time of dictation of this note. 2) no other recommendations at this time, please call if any further neurological input is needed.   Roland Rack, MD [facility] [phone]  If 7pm- 7am, please page neurology on call as listed in [place].

## 2020-03-20 NOTE — Telephone Encounter (Signed)
Orders placed.

## 2020-03-20 NOTE — ED Notes (Signed)
Report given to [place]. Inpt floor getting a low bed r/t to pt fall risk

## 2020-03-20 NOTE — ED Notes (Signed)
Very uncomfortable on stretcher ,constantly shifting on stretcher to get comfortable .  Regular hosp bed called for

## 2020-03-20 NOTE — Addendum Note (Signed)
Addended by: Boneta Lucks on: 03/20/2020 01:28 PM   Modules accepted: Orders

## 2020-03-20 NOTE — Telephone Encounter (Signed)
We can send in orders for home health for assistance see if it gets approved.

## 2020-03-20 NOTE — Discharge Planning (Signed)
Licensed Clinical Social Worker is seeking post-discharge placement for this patient at the following level of care: SNF    

## 2020-03-20 NOTE — ED Notes (Signed)
Placed on hospital bed

## 2020-03-21 ENCOUNTER — Observation Stay (HOSPITAL_COMMUNITY): Payer: Medicare PPO

## 2020-03-21 ENCOUNTER — Other Ambulatory Visit: Payer: Self-pay

## 2020-03-21 DIAGNOSIS — G4733 Obstructive sleep apnea (adult) (pediatric): Secondary | ICD-10-CM

## 2020-03-21 DIAGNOSIS — R413 Other amnesia: Secondary | ICD-10-CM | POA: Diagnosis not present

## 2020-03-21 DIAGNOSIS — I4891 Unspecified atrial fibrillation: Secondary | ICD-10-CM

## 2020-03-21 DIAGNOSIS — G9340 Encephalopathy, unspecified: Secondary | ICD-10-CM | POA: Diagnosis not present

## 2020-03-21 DIAGNOSIS — J9811 Atelectasis: Secondary | ICD-10-CM | POA: Diagnosis not present

## 2020-03-21 DIAGNOSIS — Z9989 Dependence on other enabling machines and devices: Secondary | ICD-10-CM

## 2020-03-21 DIAGNOSIS — R269 Unspecified abnormalities of gait and mobility: Secondary | ICD-10-CM

## 2020-03-21 DIAGNOSIS — I10 Essential (primary) hypertension: Secondary | ICD-10-CM | POA: Diagnosis not present

## 2020-03-21 LAB — CBC
HCT: 32.7 % — ABNORMAL LOW (ref 36.0–46.0)
Hemoglobin: 11.1 g/dL — ABNORMAL LOW (ref 12.0–15.0)
MCH: 31.4 pg (ref 26.0–34.0)
MCHC: 33.9 g/dL (ref 30.0–36.0)
MCV: 92.4 fL (ref 80.0–100.0)
Platelets: 215 10*3/uL (ref 150–400)
RBC: 3.54 MIL/uL — ABNORMAL LOW (ref 3.87–5.11)
RDW: 13.6 % (ref 11.5–15.5)
WBC: 8.7 10*3/uL (ref 4.0–10.5)
nRBC: 0 % (ref 0.0–0.2)

## 2020-03-21 LAB — BASIC METABOLIC PANEL
Anion gap: 11 (ref 5–15)
BUN: 18 mg/dL (ref 8–23)
CO2: 25 mmol/L (ref 22–32)
Calcium: 9.2 mg/dL (ref 8.9–10.3)
Chloride: 102 mmol/L (ref 98–111)
Creatinine, Ser: 0.68 mg/dL (ref 0.44–1.00)
GFR, Estimated: >60 mL/min (ref >60)
Glucose, Bld: 114 mg/dL — ABNORMAL HIGH (ref 70–99)
Potassium: 3.3 mmol/L — ABNORMAL LOW (ref 3.5–5.1)
Sodium: 138 mmol/L (ref 135–145)

## 2020-03-21 LAB — SARS CORONAVIRUS 2 BY RT PCR (HOSPITAL ORDER, PERFORMED IN ~~LOC~~ HOSPITAL LAB): SARS Coronavirus 2: NEGATIVE

## 2020-03-21 LAB — MAGNESIUM: Magnesium: 1.9 mg/dL (ref 1.7–2.4)

## 2020-03-21 LAB — TSH: TSH: 0.974 u[IU]/mL (ref 0.350–4.500)

## 2020-03-21 LAB — T4, FREE: Free T4: 0.89 ng/dL (ref 0.61–1.12)

## 2020-03-21 MED ORDER — POTASSIUM CHLORIDE CRYS ER 20 MEQ PO TBCR
40.0000 meq | EXTENDED_RELEASE_TABLET | Freq: Two times a day (BID) | ORAL | Status: AC
  Administered 2020-03-21 (×2): 40 meq via ORAL
  Filled 2020-03-21 (×2): qty 2

## 2020-03-21 MED ORDER — OXYCODONE-ACETAMINOPHEN 5-325 MG PO TABS
1.0000 | ORAL_TABLET | ORAL | Status: DC | PRN
  Administered 2020-03-22: 1 via ORAL
  Filled 2020-03-21: qty 1

## 2020-03-21 NOTE — Evaluation (Signed)
Occupational Therapy Evaluation Patient Details Name: Cynthia [name] MRN: [id number] DOB: January 04, 1941 Today's Date: 03/21/2020    History of Present Illness Pt is a 79 y/o female admitted secondary to AMS and inability to ambulate. Pt with recent bilateral foot callous removal. PMH includes a fib.    Clinical Impression   Patient admitted for the above diagnosis.  Primary deficit is post op pain to both feet limiting her mobility, which in turn, limits ADL abilities.  She needs Mod A for attempted half stand, and mod A for lower body ADL.  PTA and prior to her surgeries, she was able to walk without an assistive device, per the patient, and could perform her own ADL and toileting.  She no longer drives and her spouse would assist with her meds.  OT will continue to follow in the acute setting, and SNF has been recommended.         Follow Up Recommendations  SNF    Equipment Recommendations  3 in 1 bedside commode;Tub/shower seat;Wheelchair (measurements OT);Wheelchair cushion (measurements OT)    Recommendations for Other Services       Precautions / Restrictions Precautions Precautions: Fall Other Brace: bilateral post op shoes Restrictions RLE Weight Bearing: Weight bearing as tolerated LLE Weight Bearing: Weight bearing as tolerated      Mobility Bed Mobility   Bed Mobility: Supine to Sit;Sit to Supine     Supine to sit: Supervision Sit to supine: Supervision        Transfers Overall transfer level: Needs assistance Equipment used: Rolling walker (2 wheeled) Transfers: Sit to/from Stand Sit to Stand: Max assist              Balance   Sitting-balance support: No upper extremity supported;Feet supported Sitting balance-Leahy Scale: Fair                                     ADL either performed or assessed with clinical judgement   ADL Overall ADL's : Needs assistance/impaired     Grooming: Wash/dry hands;Wash/dry  face;Sitting;Supervision/safety   Upper Body Bathing: Supervision/ safety;Sitting   Lower Body Bathing: Moderate assistance;Sitting/lateral leans   Upper Body Dressing : Minimal assistance;Sitting   Lower Body Dressing: Moderate assistance;Sitting/lateral leans                 General ADL Comments: patient only able to achieve 1/2 stand due to pain in Bil Feet post surgery.     Vision Baseline Vision/History: Wears glasses Wears Glasses: At all times Patient Visual Report: No change from baseline       Perception     Praxis      Pertinent Vitals/Pain Faces Pain Scale: Hurts even more Pain Location: bilateral feet Pain Descriptors / Indicators: Grimacing;Guarding;Discomfort Pain Intervention(s): Monitored during session;Limited activity within patient's tolerance     Hand Dominance Right   Extremity/Trunk Assessment Upper Extremity Assessment Upper Extremity Assessment: Overall WFL for tasks assessed       Cervical / Trunk Assessment Cervical / Trunk Assessment: Kyphotic   Communication Communication Communication: No difficulties   Cognition Arousal/Alertness: Awake/alert Behavior During Therapy: WFL for tasks assessed/performed Overall Cognitive Status: History of cognitive impairments - at baseline                                 General Comments: Mild dementia with impaired memory  and decreased complex thought   General Comments  unable to achieve full stand.  BP 152/78 at the conclusion.      Exercises     Shoulder Instructions      Home Living Family/patient expects to be discharged to:: Skilled nursing facility Living Arrangements: Spouse/significant other Available Help at Discharge: Family Type of Home: House Home Access: Stairs to enter Technical brewer of Steps: 2 Entrance Stairs-Rails: None Home Layout: One level     Bathroom Shower/Tub: Teacher, early years/pre: Standard     Home Equipment:  Environmental consultant - 2 wheels          Prior Functioning/Environment Level of Independence: Needs assistance  Gait / Transfers Assistance Needed: Has been requiring assist to ambulate and to perform steps with use of RW.  ADL's / Homemaking Assistance Needed: Needed assist for ADLs since recent foot surgery, bedlevel and sitting edge of bed.            OT Problem List: Decreased activity tolerance;Impaired balance (sitting and/or standing);Decreased safety awareness;Decreased cognition;Pain      OT Treatment/Interventions: Self-care/ADL training;DME and/or AE instruction;Balance training;Therapeutic activities    OT Goals(Current goals can be found in the care plan section) Acute Rehab OT Goals Patient Stated Goal: I need to move better. OT Goal Formulation: With patient Time For Goal Achievement: 04/04/20 Potential to Achieve Goals: Fair ADL Goals Pt Will Perform Grooming: sitting;with set-up Pt Will Perform Lower Body Bathing: with supervision;sit to/from stand Pt Will Perform Lower Body Dressing: with supervision;sit to/from stand Pt Will Transfer to Toilet: with supervision;squat pivot transfer;bedside commode  OT Frequency: Min 2X/week   Barriers to D/C:    none noted       Co-evaluation              AM-PAC OT "6 Clicks" Daily Activity     Outcome Measure Help from another person eating meals?: None Help from another person taking care of personal grooming?: A Little Help from another person toileting, which includes using toliet, bedpan, or urinal?: A Lot Help from another person bathing (including washing, rinsing, drying)?: A Lot Help from another person to put on and taking off regular upper body clothing?: A Lot Help from another person to put on and taking off regular lower body clothing?: A Lot 6 Click Score: 15   End of Session Equipment Utilized During Treatment: Gait belt;Rolling walker;Other (comment) Nurse Communication: Mobility status  Activity  Tolerance: Patient limited by pain Patient left: in bed;with call bell/phone within reach;with bed alarm set  OT Visit Diagnosis: Unsteadiness on feet (R26.81);Muscle weakness (generalized) (M62.81);History of falling (Z91.81);Other symptoms and signs involving cognitive function;Pain Pain - Right/Left: Left Pain - part of body: Ankle and joints of foot                Time: 6606-0045 OT Time Calculation (min): 19 min Charges:  OT General Charges $OT Visit: 1 Visit OT Evaluation $OT Eval Moderate Complexity: 1 Mod  03/21/2020  Rich, OTR/L  Acute Rehabilitation Services  Office:  [place] 03/21/2020, 5:16 PM

## 2020-03-21 NOTE — Progress Notes (Signed)
Patient refusing CPAP.

## 2020-03-21 NOTE — Progress Notes (Signed)
PROGRESS NOTE  Cynthia [name]:[id number] DOB: [birth year] DOA: 03/20/2020 PCP: Deland Pretty, MD  HPI/Recap of past 24 hours: HPI from Dr Merleen Milliner Cynthia [name] is a 79 y.o. female with PMH sig for PAF, exostosis of both feet status post surgery by podiatry on 03/18/2020, HL, GERD, OSA on CPAP, mild dementia and dizziness with gait abnormality presently being worked up for NPH was in her usual state of health until was noted to be confused by her family. Pt c/o significant pain in her feet with surgery, was given oxycodone and then 2 hours later was found sitting on the side of the bed with slurred speech and significant confusion.  Patient was called in as a code stroke to the ED. Patient herself states she does remember what happened.  Notes "I was slipping and sliding on the side of the bed". Patient thinks that she has been able to get up and go to the bathroom at home since her surgery.  Daughter however is requesting placement in rehab as they are having difficulty managing her since her surgery and her difficulty with gait and pain. The daughter believes that patient forgets that she has had surgery and tries to get out of bed and immediately falls due to the pain which she does not remember that she has. In the ED, neurology felt this was more likely toxic metabolic encephalopathy most likely secondary to oxycodone and not using her CPAP overnight.  Emergent head CT was negative for any acute intracranial process.  Patient was however noted to be in RVR with heart rate of 150 in the ED, difficult to control, so pt was admitted. Of note patient has been off her Eliquis due to her recent foot surgery.    Today, patient denies any new complaints, reports she is tired of sitting/laying around.  Reminded her that she just recently had surgery, would need to take things easy.  Reported she called her primary care doctor for ??cough drops.  Patient denies any palpitations, chest pain, shortness of  breath, abdominal pain, nausea/vomiting, fever/chills.  Assessment/Plan: Principal Problem:   Atrial fibrillation with RVR (HCC) Active Problems:   OSA on CPAP   Essential hypertension, benign   GERD (gastroesophageal reflux disease)   History of recurrent UTIs   Memory loss   Postural dizziness with presyncope   Gait disturbance   Paroxysmal A. fib with RVR Rate controlled currently still requiring diltiazem drip, plan to wean off Continue p.o. Cardizem, Eliquis TSH, free T4 pending  Difficulty with ambulation status post bilateral foot surgery Patient is s/p bilateral removal of exostosis on 03/18/20 PT/OT consulted-SNF placement Fall precautions  Hypokalemia Replace as needed  Hypothyroidism Continue Synthroid  GERD PPI  OSA Continue CPAP  Mild Dementia Delirium precautions       Malnutrition Type:      Malnutrition Characteristics:      Nutrition Interventions:       Estimated body mass index is 24.14 kg/m as calculated from the following:   Height as of this encounter: 5\' 5"  (1.651 m).   Weight as of this encounter: 65.8 kg.     Code Status: Full  Family Communication: None at bedside  Disposition Plan: Status is: Observation  The patient will require care spanning > 2 midnights and should be moved to inpatient because: Inpatient level of care appropriate due to severity of illness  Dispo: The patient is from: Home  Anticipated d/c is to: SNF              Anticipated d/c date is: 1 day              Patient currently is not medically stable to d/c.    Consultants:  None  Procedures:  None  Antimicrobials:  None  DVT prophylaxis: Eliquis   Objective: Vitals:   03/21/20 0500 03/21/20 0858 03/21/20 1000 03/21/20 1105  BP: (!) 123/97 123/77 (!) 154/76 (!) 146/77  Pulse: 81 74 80 77  Resp: 20 20 18 15   Temp: 98.4 F (36.9 C) 98.2 F (36.8 C)  98.4 F (36.9 C)  TempSrc: Oral Oral  Oral  SpO2: 93% 94%  93% 93%  Weight:      Height:        Intake/Output Summary (Last 24 hours) at 03/21/2020 1110 Last data filed at 03/21/2020 0500 Gross per 24 hour  Intake --  Output 300 ml  Net -300 ml   Filed Weights   03/20/20 0917  Weight: 65.8 kg    Exam:  General: NAD, intermittent confusion noted  Cardiovascular: S1, S2 present  Respiratory: CTAB  Abdomen: Soft, nontender, nondistended, bowel sounds present  Musculoskeletal: No bilateral pedal edema noted, bilateral foot placed in boots  Skin: Normal  Psychiatry:  Intermittent confusion noted   Data Reviewed: CBC: Recent Labs  Lab 03/20/20 0849 03/20/20 0850 03/21/20 0222  WBC 9.9  --  8.7  NEUTROABS 6.9  --   --   HGB 11.7* 12.6 11.1*  HCT 36.7 37.0 32.7*  MCV 95.1  --  92.4  PLT 203  --  408   Basic Metabolic Panel: Recent Labs  Lab 03/20/20 0849 03/20/20 0850 03/21/20 0222  NA 138 141 138  K 3.2* 3.2* 3.3*  CL 101 100 102  CO2 28  --  25  GLUCOSE 115* 109* 114*  BUN 21 25* 18  CREATININE 0.65 0.50 0.68  CALCIUM 8.8*  --  9.2  MG  --   --  1.9   GFR: Estimated Creatinine Clearance: 53 mL/min (by C-G formula based on SCr of 0.68 mg/dL). Liver Function Tests: Recent Labs  Lab 03/20/20 0849  AST 26  ALT 18  ALKPHOS 41  BILITOT 0.8  PROT 6.0*  ALBUMIN 3.3*   No results for input(s): LIPASE, AMYLASE in the last 168 hours. No results for input(s): AMMONIA in the last 168 hours. Coagulation Profile: Recent Labs  Lab 03/20/20 0849  INR 1.0   Cardiac Enzymes: No results for input(s): CKTOTAL, CKMB, CKMBINDEX, TROPONINI in the last 168 hours. BNP (last 3 results) No results for input(s): PROBNP in the last 8760 hours. HbA1C: No results for input(s): HGBA1C in the last 72 hours. CBG: Recent Labs  Lab 03/20/20 0844  GLUCAP 109*   Lipid Profile: No results for input(s): CHOL, HDL, LDLCALC, TRIG, CHOLHDL, LDLDIRECT in the last 72 hours. Thyroid Function Tests: No results for input(s): TSH,  T4TOTAL, FREET4, T3FREE, THYROIDAB in the last 72 hours. Anemia Panel: No results for input(s): VITAMINB12, FOLATE, FERRITIN, TIBC, IRON, RETICCTPCT in the last 72 hours. Urine analysis:    Component Value Date/Time   COLORURINE YELLOW 03/20/2020 0917   APPEARANCEUR HAZY (A) 03/20/2020 0917   LABSPEC 1.016 03/20/2020 [place] 7.0 03/20/2020 [place] 03/20/2020 0917   HGBUR NEGATIVE 03/20/2020 [place] NEGATIVE 03/20/2020 0917   KETONESUR 5 (A) 03/20/2020 [place] NEGATIVE 03/20/2020 1448  NITRITE NEGATIVE 03/20/2020 0917   LEUKOCYTESUR NEGATIVE 03/20/2020 0917   Sepsis Labs: @LABRCNTIP (procalcitonin:4,lacticidven:4)  ) Recent Results (from the past 240 hour(s))  Resp Panel by RT-PCR (Flu A&B, Covid) Nasopharyngeal Swab     Status: None   Collection Time: 03/20/20  9:17 AM   Specimen: Nasopharyngeal Swab; Nasopharyngeal(NP) swabs in vial transport medium  Result Value Ref Range Status   SARS Coronavirus 2 by RT PCR NEGATIVE NEGATIVE Final    Comment: (NOTE) SARS-CoV-2 target nucleic acids are NOT DETECTED.  The SARS-CoV-2 RNA is generally detectable in upper respiratory specimens during the acute phase of infection. The lowest concentration of SARS-CoV-2 viral copies this assay can detect is 138 copies/mL. A negative result does not preclude SARS-Cov-2 infection and should not be used as the sole basis for treatment or other patient management decisions. A negative result may occur with  improper specimen collection/handling, submission of specimen other than nasopharyngeal swab, presence of viral mutation(s) within the areas targeted by this assay, and inadequate number of viral copies(<138 copies/mL). A negative result must be combined with clinical observations, patient history, and epidemiological information. The expected result is Negative.  Fact Sheet for Patients:  EntrepreneurPulse.com.au  Fact Sheet for  Healthcare Providers:  IncredibleEmployment.be  This test is no t yet approved or cleared by the [place] FDA and  has been authorized for detection and/or diagnosis of SARS-CoV-2 by FDA under an Emergency Use Authorization (EUA). This EUA will remain  in effect (meaning this test can be used) for the duration of the COVID-19 declaration under Section 564(b)(1) of the Act, 21 U.S.C.section 360bbb-3(b)(1), unless the authorization is terminated  or revoked sooner.       Influenza A by PCR NEGATIVE NEGATIVE Final   Influenza B by PCR NEGATIVE NEGATIVE Final    Comment: (NOTE) The Xpert Xpress SARS-CoV-2/FLU/RSV plus assay is intended as an aid in the diagnosis of influenza from Nasopharyngeal swab specimens and should not be used as a sole basis for treatment. Nasal washings and aspirates are unacceptable for Xpert Xpress SARS-CoV-2/FLU/RSV testing.  Fact Sheet for Patients: EntrepreneurPulse.com.au  Fact Sheet for Healthcare Providers: IncredibleEmployment.be  This test is not yet approved or cleared by the [place] FDA and has been authorized for detection and/or diagnosis of SARS-CoV-2 by FDA under an Emergency Use Authorization (EUA). This EUA will remain in effect (meaning this test can be used) for the duration of the COVID-19 declaration under Section 564(b)(1) of the Act, 21 U.S.C. section 360bbb-3(b)(1), unless the authorization is terminated or revoked.  Performed at [facility], [place] [address], [place], [place] [address]       Studies: No results found.  Scheduled Meds: . acetaminophen  500 mg Oral BID  . apixaban  5 mg Oral BID  . atorvastatin  10 mg Oral Daily  . calcium-vitamin D  1 tablet Oral BID  . cholecalciferol  1,000 Units Oral Daily  . cycloSPORINE  1 drop Both Eyes BID  . desmopressin  600 mcg Oral QHS  . diltiazem  120 mg Oral Daily  . DULoxetine  30 mg Oral BID  .  ibuprofen  400 mg Oral BID  . melatonin  6 mg Oral QHS  . nitrofurantoin  100 mg Oral Daily  . pantoprazole  40 mg Oral Daily  . potassium chloride  40 mEq Oral BID  . sodium chloride flush  3 mL Intravenous Once  . sodium chloride flush  3 mL Intravenous Q12H  . thyroid  60 mg Oral QAC breakfast    Continuous Infusions: . sodium chloride    . diltiazem (CARDIZEM) infusion 5 mg/hr (03/20/20 1452)     LOS: 0 days     Alma Friendly, MD [facility]  If 7PM-7AM, please contact night-coverage [email or web address] 03/21/2020, 11:10 AM

## 2020-03-21 NOTE — TOC Progression Note (Addendum)
Transition of Care [facility]) - Progression Note    Patient Details  Name: Cynthia [name] MRN: [id number] Date of Birth: 28-Apr-1940  Transition of Care [facility]) CM/SW [place], LCSW Phone Number: 03/21/2020, 1:27 PM  Clinical Narrative:    CSW provided bed offers to patient. She (and spouse) have selected [facility] and they are able to accept patient. CSW initiated insurance authorization with [employer]. CSW also updated patient's daughter who expressed concern that patient seemed confused when they spoke on the phone earlier.   Expected Discharge Plan: [place] Barriers to Discharge: Continued Medical Work up  Expected Discharge Plan and Services Expected Discharge Plan: [place] Choice: [place] arrangements for the past 2 months: Single Family Home                                       Social Determinants of Health (SDOH) Interventions    Readmission Risk Interventions No flowsheet data found.

## 2020-03-22 DIAGNOSIS — R413 Other amnesia: Secondary | ICD-10-CM | POA: Diagnosis not present

## 2020-03-22 DIAGNOSIS — R269 Unspecified abnormalities of gait and mobility: Secondary | ICD-10-CM | POA: Diagnosis not present

## 2020-03-22 DIAGNOSIS — I4891 Unspecified atrial fibrillation: Secondary | ICD-10-CM | POA: Diagnosis not present

## 2020-03-22 DIAGNOSIS — I10 Essential (primary) hypertension: Secondary | ICD-10-CM | POA: Diagnosis not present

## 2020-03-22 LAB — BASIC METABOLIC PANEL
Anion gap: 9 (ref 5–15)
BUN: 12 mg/dL (ref 8–23)
CO2: 24 mmol/L (ref 22–32)
Calcium: 8.8 mg/dL — ABNORMAL LOW (ref 8.9–10.3)
Chloride: 99 mmol/L (ref 98–111)
Creatinine, Ser: 0.57 mg/dL (ref 0.44–1.00)
GFR, Estimated: >60 mL/min (ref >60)
Glucose, Bld: 159 mg/dL — ABNORMAL HIGH (ref 70–99)
Potassium: 4 mmol/L (ref 3.5–5.1)
Sodium: 132 mmol/L — ABNORMAL LOW (ref 135–145)

## 2020-03-22 LAB — CBC WITH DIFFERENTIAL/PLATELET
Abs Immature Granulocytes: 0.04 10*3/uL (ref 0.00–0.07)
Basophils Absolute: 0 10*3/uL (ref 0.0–0.1)
Basophils Relative: 0 %
Eosinophils Absolute: 0.1 10*3/uL (ref 0.0–0.5)
Eosinophils Relative: 2 %
HCT: 35.6 % — ABNORMAL LOW (ref 36.0–46.0)
Hemoglobin: 11.8 g/dL — ABNORMAL LOW (ref 12.0–15.0)
Immature Granulocytes: 1 %
Lymphocytes Relative: 18 %
Lymphs Abs: 1.5 10*3/uL (ref 0.7–4.0)
MCH: 30.3 pg (ref 26.0–34.0)
MCHC: 33.1 g/dL (ref 30.0–36.0)
MCV: 91.5 fL (ref 80.0–100.0)
Monocytes Absolute: 0.9 10*3/uL (ref 0.1–1.0)
Monocytes Relative: 10 %
Neutro Abs: 5.9 10*3/uL (ref 1.7–7.7)
Neutrophils Relative %: 69 %
Platelets: 208 10*3/uL (ref 150–400)
RBC: 3.89 MIL/uL (ref 3.87–5.11)
RDW: 13.2 % (ref 11.5–15.5)
WBC: 8.4 10*3/uL (ref 4.0–10.5)
nRBC: 0 % (ref 0.0–0.2)

## 2020-03-22 MED ORDER — QUETIAPINE FUMARATE 25 MG PO TABS
25.0000 mg | ORAL_TABLET | Freq: Every day | ORAL | Status: DC
  Administered 2020-03-22: 25 mg via ORAL
  Filled 2020-03-22: qty 1

## 2020-03-22 MED ORDER — LORAZEPAM 0.5 MG PO TABS
0.5000 mg | ORAL_TABLET | Freq: Once | ORAL | Status: AC | PRN
  Administered 2020-03-22: 0.5 mg via ORAL
  Filled 2020-03-22: qty 1

## 2020-03-22 MED ORDER — LORAZEPAM 2 MG/ML IJ SOLN
0.5000 mg | Freq: Once | INTRAMUSCULAR | Status: AC
  Administered 2020-03-22: 0.5 mg via INTRAVENOUS
  Filled 2020-03-22: qty 1

## 2020-03-22 MED ORDER — LORAZEPAM 2 MG/ML IJ SOLN
1.0000 mg | Freq: Once | INTRAMUSCULAR | Status: AC
  Administered 2020-03-22: 1 mg via INTRAVENOUS
  Filled 2020-03-22: qty 1

## 2020-03-22 NOTE — Progress Notes (Signed)
PROGRESS NOTE  Cynthia [name] YBW:[id number] DOB: 26-May-1940 DOA: 03/20/2020 PCP: Deland Pretty, MD  HPI/Recap of past 24 hours: HPI from Dr Merleen Milliner Cynthia [name] is a 79 y.o. female with PMH sig for PAF, exostosis of both feet status post surgery by podiatry on 03/18/2020, HL, GERD, OSA on CPAP, mild dementia and dizziness with gait abnormality presently being worked up for NPH was in her usual state of health until was noted to be confused by her family. Pt c/o significant pain in her feet with surgery, was given oxycodone and then 2 hours later was found sitting on the side of the bed with slurred speech and significant confusion.  Patient was called in as a code stroke to the ED. Patient herself states she does remember what happened.  Notes "I was slipping and sliding on the side of the bed". Patient thinks that she has been able to get up and go to the bathroom at home since her surgery.  Daughter however is requesting placement in rehab as they are having difficulty managing her since her surgery and her difficulty with gait and pain. The daughter believes that patient forgets that she has had surgery and tries to get out of bed and immediately falls due to the pain which she does not remember that she has. In the ED, neurology felt this was more likely toxic metabolic encephalopathy most likely secondary to oxycodone and not using her CPAP overnight.  Emergent head CT was negative for any acute intracranial process.  Patient was however noted to be in RVR with heart rate of 150 in the ED, difficult to control, so pt was admitted. Of note patient has been off her Eliquis due to her recent foot surgery.     Today, patient noted to be confused throughout the day and as well as overnight, possibly sundowning. Disussed extensively with spouse and daughter about overall care and discharge plans.    Assessment/Plan: Principal Problem:   Atrial fibrillation with RVR (HCC) Active Problems:    OSA on CPAP   Essential hypertension, benign   GERD (gastroesophageal reflux disease)   History of recurrent UTIs   Memory loss   Postural dizziness with presyncope   Gait disturbance   Paroxysmal A. fib with RVR Rate controlled currently off diltiazem drip Continue p.o. Cardizem, Eliquis TSH, free T4 WNL  Difficulty with ambulation status post bilateral foot surgery Patient is s/p bilateral removal of exostosis on 03/18/20 PT/OT consulted-SNF placement Fall precautions  Hypokalemia Replace as needed  Hypothyroidism Continue Synthroid  GERD PPI  OSA Continue CPAP  History of possible ?NPH Memory loss Delirium precautions Continue to follow-up with outpatient neurologist Ativan as needed, start Seroquel at bedtime       Malnutrition Type:      Malnutrition Characteristics:      Nutrition Interventions:       Estimated body mass index is 24.14 kg/m as calculated from the following:   Height as of this encounter: 5\' 5"  (1.651 m).   Weight as of this encounter: 65.8 kg.      Code Status: Full  Family Communication: None at bedside  Disposition Plan: Status is: Observation  The patient will require care spanning > 2 midnights and should be moved to inpatient because: Inpatient level of care appropriate due to severity of illness  Dispo: The patient is from: Home              Anticipated d/c is to: SNF  Anticipated d/c date is: 1 day              Patient currently is not medically stable to d/c.    Consultants:  None  Procedures:  None  Antimicrobials:  None  DVT prophylaxis: Eliquis   Objective: Vitals:   03/22/20 0030 03/22/20 0533 03/22/20 0535 03/22/20 0756  BP:    (!) 119/92  Pulse: (P) 78  77 71  Resp:   (!) 21 18  Temp: (P) 98 F (36.7 C) 97.7 F (36.5 C)  (!) 97.3 F (36.3 C)  TempSrc: (P) Oral Axillary  Axillary  SpO2:   91% 97%  Weight:      Height:        Intake/Output Summary (Last 24 hours)  at 03/22/2020 1615 Last data filed at 03/22/2020 4315 Gross per 24 hour  Intake 76.75 ml  Output 1350 ml  Net -1273.25 ml   Filed Weights   03/20/20 0917  Weight: 65.8 kg    Exam:  General: NAD, intermittent confusion noted  Cardiovascular: S1, S2 present  Respiratory: CTAB  Abdomen: Soft, nontender, nondistended, bowel sounds present  Musculoskeletal: No bilateral pedal edema noted, bilateral foot placed in boots  Skin: Normal  Psychiatry:  Intermittent confusion noted   Data Reviewed: CBC: Recent Labs  Lab 03/20/20 0849 03/20/20 0850 03/21/20 0222 03/22/20 0057  WBC 9.9  --  8.7 8.4  NEUTROABS 6.9  --   --  5.9  HGB 11.7* 12.6 11.1* 11.8*  HCT 36.7 37.0 32.7* 35.6*  MCV 95.1  --  92.4 91.5  PLT 203  --  215 400   Basic Metabolic Panel: Recent Labs  Lab 03/20/20 0849 03/20/20 0850 03/21/20 0222 03/22/20 0554  NA 138 141 138 132*  K 3.2* 3.2* 3.3* 4.0  CL 101 100 102 99  CO2 28  --  25 24  GLUCOSE 115* 109* 114* 159*  BUN 21 25* 18 12  CREATININE 0.65 0.50 0.68 0.57  CALCIUM 8.8*  --  9.2 8.8*  MG  --   --  1.9  --    GFR: Estimated Creatinine Clearance: 53 mL/min (by C-G formula based on SCr of 0.57 mg/dL). Liver Function Tests: Recent Labs  Lab 03/20/20 0849  AST 26  ALT 18  ALKPHOS 41  BILITOT 0.8  PROT 6.0*  ALBUMIN 3.3*   No results for input(s): LIPASE, AMYLASE in the last 168 hours. No results for input(s): AMMONIA in the last 168 hours. Coagulation Profile: Recent Labs  Lab 03/20/20 0849  INR 1.0   Cardiac Enzymes: No results for input(s): CKTOTAL, CKMB, CKMBINDEX, TROPONINI in the last 168 hours. BNP (last 3 results) No results for input(s): PROBNP in the last 8760 hours. HbA1C: No results for input(s): HGBA1C in the last 72 hours. CBG: Recent Labs  Lab 03/20/20 0844  GLUCAP 109*   Lipid Profile: No results for input(s): CHOL, HDL, LDLCALC, TRIG, CHOLHDL, LDLDIRECT in the last 72 hours. Thyroid Function  Tests: Recent Labs    03/21/20 1219  TSH 0.974  FREET4 0.89   Anemia Panel: No results for input(s): VITAMINB12, FOLATE, FERRITIN, TIBC, IRON, RETICCTPCT in the last 72 hours. Urine analysis:    Component Value Date/Time   COLORURINE YELLOW 03/20/2020 0917   APPEARANCEUR HAZY (A) 03/20/2020 0917   LABSPEC 1.016 03/20/2020 0917   PHURINE 7.0 03/20/2020 0917   GLUCOSEU NEGATIVE 03/20/2020 0917   HGBUR NEGATIVE 03/20/2020 0917   BILIRUBINUR NEGATIVE 03/20/2020 0917   KETONESUR 5 (A)  03/20/2020 0917   PROTEINUR NEGATIVE 03/20/2020 0917   NITRITE NEGATIVE 03/20/2020 0917   LEUKOCYTESUR NEGATIVE 03/20/2020 0917   Sepsis Labs: @LABRCNTIP (procalcitonin:4,lacticidven:4)  ) Recent Results (from the past 240 hour(s))  Resp Panel by RT-PCR (Flu A&B, Covid) Nasopharyngeal Swab     Status: None   Collection Time: 03/20/20  9:17 AM   Specimen: Nasopharyngeal Swab; Nasopharyngeal(NP) swabs in vial transport medium  Result Value Ref Range Status   SARS Coronavirus 2 by RT PCR NEGATIVE NEGATIVE Final    Comment: (NOTE) SARS-CoV-2 target nucleic acids are NOT DETECTED.  The SARS-CoV-2 RNA is generally detectable in upper respiratory specimens during the acute phase of infection. The lowest concentration of SARS-CoV-2 viral copies this assay can detect is 138 copies/mL. A negative result does not preclude SARS-Cov-2 infection and should not be used as the sole basis for treatment or other patient management decisions. A negative result may occur with  improper specimen collection/handling, submission of specimen other than nasopharyngeal swab, presence of viral mutation(s) within the areas targeted by this assay, and inadequate number of viral copies(<138 copies/mL). A negative result must be combined with clinical observations, patient history, and epidemiological information. The expected result is Negative.  Fact Sheet for Patients:  EntrepreneurPulse.com.au  Fact  Sheet for Healthcare Providers:  IncredibleEmployment.be  This test is no t yet approved or cleared by the [place] FDA and  has been authorized for detection and/or diagnosis of SARS-CoV-2 by FDA under an Emergency Use Authorization (EUA). This EUA will remain  in effect (meaning this test can be used) for the duration of the COVID-19 declaration under Section 564(b)(1) of the Act, 21 U.S.C.section 360bbb-3(b)(1), unless the authorization is terminated  or revoked sooner.       Influenza A by PCR NEGATIVE NEGATIVE Final   Influenza B by PCR NEGATIVE NEGATIVE Final    Comment: (NOTE) The Xpert Xpress SARS-CoV-2/FLU/RSV plus assay is intended as an aid in the diagnosis of influenza from Nasopharyngeal swab specimens and should not be used as a sole basis for treatment. Nasal washings and aspirates are unacceptable for Xpert Xpress SARS-CoV-2/FLU/RSV testing.  Fact Sheet for Patients: EntrepreneurPulse.com.au  Fact Sheet for Healthcare Providers: IncredibleEmployment.be  This test is not yet approved or cleared by the [place] FDA and has been authorized for detection and/or diagnosis of SARS-CoV-2 by FDA under an Emergency Use Authorization (EUA). This EUA will remain in effect (meaning this test can be used) for the duration of the COVID-19 declaration under Section 564(b)(1) of the Act, 21 U.S.C. section 360bbb-3(b)(1), unless the authorization is terminated or revoked.  Performed at [facility], [place] [address], [place], [place] [address]   SARS Coronavirus 2 by RT PCR (hospital order, performed in [facility] hospital lab) Nasopharyngeal Nasopharyngeal Swab     Status: None   Collection Time: 03/21/20  4:55 PM   Specimen: Nasopharyngeal Swab  Result Value Ref Range Status   SARS Coronavirus 2 NEGATIVE NEGATIVE Final    Comment: (NOTE) SARS-CoV-2 target nucleic acids are NOT DETECTED.  The SARS-CoV-2  RNA is generally detectable in upper and lower respiratory specimens during the acute phase of infection. The lowest concentration of SARS-CoV-2 viral copies this assay can detect is 250 copies / mL. A negative result does not preclude SARS-CoV-2 infection and should not be used as the sole basis for treatment or other patient management decisions.  A negative result may occur with improper specimen collection / handling, submission of specimen other than  nasopharyngeal swab, presence of viral mutation(s) within the areas targeted by this assay, and inadequate number of viral copies (<250 copies / mL). A negative result must be combined with clinical observations, patient history, and epidemiological information.  Fact Sheet for Patients:   StrictlyIdeas.no  Fact Sheet for Healthcare Providers: BankingDealers.co.za  This test is not yet approved or  cleared by the [place] FDA and has been authorized for detection and/or diagnosis of SARS-CoV-2 by FDA under an Emergency Use Authorization (EUA).  This EUA will remain in effect (meaning this test can be used) for the duration of the COVID-19 declaration under Section 564(b)(1) of the Act, 21 U.S.C. section 360bbb-3(b)(1), unless the authorization is terminated or revoked sooner.  Performed at [facility], [place] [address] [place], [place] [address]       Studies: DG Chest Port 1 View  Result Date: 03/21/2020 CLINICAL DATA:  Acute encephalopathy EXAM: PORTABLE CHEST 1 VIEW COMPARISON:  Portable exam 1801 hours compared to 01/24/2020 FINDINGS: Normal heart size, mediastinal contours, and pulmonary vascularity. Atherosclerotic calcification aorta. Mild elevation of RIGHT diaphragm. Minimal LEFT basilar atelectasis. Lungs otherwise clear. No acute infiltrate, pleural effusion or pneumothorax. Bones demineralized. IMPRESSION: Minimal LEFT basilar atelectasis. Aortic Atherosclerosis  (ICD10-I70.0). Electronically Signed   By: Lavonia Dana M.D.   On: 03/21/2020 18:49    Scheduled Meds: . acetaminophen  500 mg Oral BID  . apixaban  5 mg Oral BID  . atorvastatin  10 mg Oral Daily  . calcium-vitamin D  1 tablet Oral BID  . cholecalciferol  1,000 Units Oral Daily  . cycloSPORINE  1 drop Both Eyes BID  . desmopressin  600 mcg Oral QHS  . diltiazem  120 mg Oral Daily  . DULoxetine  30 mg Oral BID  . ibuprofen  400 mg Oral BID  . LORazepam  1 mg Intravenous Once  . melatonin  6 mg Oral QHS  . nitrofurantoin  100 mg Oral Daily  . pantoprazole  40 mg Oral Daily  . QUEtiapine  25 mg Oral QHS  . sodium chloride flush  3 mL Intravenous Once  . sodium chloride flush  3 mL Intravenous Q12H  . thyroid  60 mg Oral QAC breakfast    Continuous Infusions: . sodium chloride       LOS: 0 days     Alma Friendly, MD [facility]  If 7PM-7AM, please contact night-coverage [email or web address] 03/22/2020, 4:15 PM

## 2020-03-22 NOTE — Progress Notes (Signed)
Pt constantly attempting to climb out of bed. Pt getting up so frequently that staff member had to come sit in the room with pt to keep her safe. Paged provider on call regarding the manner. Awaiting further orders. Will continue to monitor pt.

## 2020-03-22 NOTE — TOC Progression Note (Signed)
Transition of Care [facility]) - Progression Note    Patient Details  Name: Cynthia [name] MRN: [id number] Date of Birth: Jun 16, 1940  Transition of Care [facility]) CM/SW [place], [place] Phone Number: 03/22/2020, 5:30 PM  Clinical Narrative:      CSW spoke with Olivia Mackie with [facility] who can accept patient for SNF placement tomorrow. CSW spoke with patients spouse at bedside. All concerns addressed. No further questions reported at this time. CSW to continue to follow and assist with discharge planning needs.  Patient has SNF bed at [facility]. Insurance authorization has been approved.   Expected Discharge Plan: [place] Barriers to Discharge: Continued Medical Work up  Expected Discharge Plan and Services Expected Discharge Plan: [place] Choice: [place] arrangements for the past 2 months: Single Family Home                                       Social Determinants of Health (SDOH) Interventions    Readmission Risk Interventions No flowsheet data found.

## 2020-03-22 NOTE — Progress Notes (Addendum)
Pt sitting up comfortably in bed with no signs of agitation or restlessness. Pt requesting breakfast, and asking when her husband will be by to see her. Pt alert and oriented x3. Sitter order has been canceled due to improving orientation and compliance. Will continue to monitor.

## 2020-03-23 ENCOUNTER — Observation Stay (HOSPITAL_COMMUNITY): Payer: Medicare PPO

## 2020-03-23 ENCOUNTER — Telehealth: Payer: Self-pay | Admitting: Neurology

## 2020-03-23 DIAGNOSIS — G9340 Encephalopathy, unspecified: Secondary | ICD-10-CM | POA: Diagnosis not present

## 2020-03-23 DIAGNOSIS — R269 Unspecified abnormalities of gait and mobility: Secondary | ICD-10-CM | POA: Diagnosis not present

## 2020-03-23 DIAGNOSIS — I4891 Unspecified atrial fibrillation: Secondary | ICD-10-CM | POA: Diagnosis not present

## 2020-03-23 DIAGNOSIS — Z87440 Personal history of urinary (tract) infections: Secondary | ICD-10-CM | POA: Diagnosis not present

## 2020-03-23 DIAGNOSIS — I10 Essential (primary) hypertension: Secondary | ICD-10-CM | POA: Diagnosis not present

## 2020-03-23 LAB — CBC WITH DIFFERENTIAL/PLATELET
Abs Immature Granulocytes: 0.03 10*3/uL (ref 0.00–0.07)
Basophils Absolute: 0 10*3/uL (ref 0.0–0.1)
Basophils Relative: 0 %
Eosinophils Absolute: 0.1 10*3/uL (ref 0.0–0.5)
Eosinophils Relative: 1 %
HCT: 35.7 % — ABNORMAL LOW (ref 36.0–46.0)
Hemoglobin: 12 g/dL (ref 12.0–15.0)
Immature Granulocytes: 0 %
Lymphocytes Relative: 15 %
Lymphs Abs: 1.4 10*3/uL (ref 0.7–4.0)
MCH: 30.6 pg (ref 26.0–34.0)
MCHC: 33.6 g/dL (ref 30.0–36.0)
MCV: 91.1 fL (ref 80.0–100.0)
Monocytes Absolute: 1 10*3/uL (ref 0.1–1.0)
Monocytes Relative: 10 %
Neutro Abs: 7.1 10*3/uL (ref 1.7–7.7)
Neutrophils Relative %: 74 %
Platelets: 228 10*3/uL (ref 150–400)
RBC: 3.92 MIL/uL (ref 3.87–5.11)
RDW: 13.1 % (ref 11.5–15.5)
WBC: 9.7 10*3/uL (ref 4.0–10.5)
nRBC: 0 % (ref 0.0–0.2)

## 2020-03-23 LAB — URINALYSIS, ROUTINE W REFLEX MICROSCOPIC
Bacteria, UA: NONE SEEN
Bilirubin Urine: NEGATIVE
Glucose, UA: NEGATIVE mg/dL
Hgb urine dipstick: NEGATIVE
Ketones, ur: 80 mg/dL — AB
Leukocytes,Ua: NEGATIVE
Nitrite: NEGATIVE
Protein, ur: 100 mg/dL — AB
Specific Gravity, Urine: 1.015 (ref 1.005–1.030)
pH: 7 (ref 5.0–8.0)

## 2020-03-23 LAB — BASIC METABOLIC PANEL
Anion gap: 11 (ref 5–15)
BUN: 11 mg/dL (ref 8–23)
CO2: 23 mmol/L (ref 22–32)
Calcium: 9.1 mg/dL (ref 8.9–10.3)
Chloride: 100 mmol/L (ref 98–111)
Creatinine, Ser: 0.53 mg/dL (ref 0.44–1.00)
GFR, Estimated: >60 mL/min (ref >60)
Glucose, Bld: 113 mg/dL — ABNORMAL HIGH (ref 70–99)
Potassium: 3.4 mmol/L — ABNORMAL LOW (ref 3.5–5.1)
Sodium: 134 mmol/L — ABNORMAL LOW (ref 135–145)

## 2020-03-23 LAB — AMMONIA: Ammonia: 24 umol/L (ref 9–35)

## 2020-03-23 MED ORDER — POTASSIUM CHLORIDE CRYS ER 20 MEQ PO TBCR
40.0000 meq | EXTENDED_RELEASE_TABLET | Freq: Once | ORAL | Status: AC
  Administered 2020-03-23: 40 meq via ORAL
  Filled 2020-03-23: qty 2

## 2020-03-23 MED ORDER — QUETIAPINE FUMARATE 25 MG PO TABS
25.0000 mg | ORAL_TABLET | Freq: Every evening | ORAL | Status: DC | PRN
Start: 2020-03-23 — End: 2020-03-25

## 2020-03-23 NOTE — Progress Notes (Addendum)
PROGRESS NOTE  Cynthia [name] OIZ:[id number] DOB: February 28, 1941 DOA: 03/20/2020 PCP: Deland Pretty, MD  HPI/Recap of past 24 hours: HPI from Dr Merleen Milliner Cynthia [name] is a 79 y.o. female with PMH sig for PAF, exostosis of both feet status post surgery by podiatry on 03/18/2020, HL, GERD, OSA on CPAP, mild dementia and dizziness with gait abnormality presently being worked up for NPH was in her usual state of health until was noted to be confused by her family. Pt c/o significant pain in her feet with surgery, was given oxycodone and then 2 hours later was found sitting on the side of the bed with slurred speech and significant confusion.  Patient was called in as a code stroke to the ED. Patient herself states she does remember what happened.  Notes "I was slipping and sliding on the side of the bed". Patient thinks that she has been able to get up and go to the bathroom at home since her surgery.  Daughter however is requesting placement in rehab as they are having difficulty managing her since her surgery and her difficulty with gait and pain. The daughter believes that patient forgets that she has had surgery and tries to get out of bed and immediately falls due to the pain which she does not remember that she has. In the ED, neurology felt this was more likely toxic metabolic encephalopathy most likely secondary to oxycodone and not using her CPAP overnight.  Emergent head CT was negative for any acute intracranial process.  Patient was however noted to be in RVR with heart rate of 150 in the ED, difficult to control, so pt was admitted. Of note patient has been off her Eliquis due to her recent foot surgery.     Today, patient noted to be much drowsy received Seroquel, Ativan last night.  Still very disoriented, confused    Assessment/Plan: Principal Problem:   Atrial fibrillation with RVR (HCC) Active Problems:   OSA on CPAP   Essential hypertension, benign   GERD (gastroesophageal  reflux disease)   History of recurrent UTIs   Memory loss   Postural dizziness with presyncope   Gait disturbance   Paroxysmal A. fib with RVR Rate controlled currently off diltiazem drip Continue p.o. Cardizem, Eliquis TSH, free T4 WNL  Acute metabolic encephalopathy Unknown etiology, family reports this is not patient's baseline ?  Memory loss, history of NPH Rule out any source of infection-repeat UA/UC, BC x2, chest x-ray RPR, vitamin B12 pending CT head on admission, unremarkable, no evidence of acute intracranial abnormality, noted ventriculomegaly Reconsulted neurology, recommend neurosurgery consult Discussed with neurosurgery Dr. Venetia Constable on 03/23/2020, does not think acute encephalopathy is due to/related to NPH, and placement of a shunt is not indicated  Difficulty with ambulation status post bilateral foot surgery Patient is s/p bilateral removal of exostosis on 03/18/20 PT/OT consulted-SNF placement Fall precautions  Hypokalemia Replace as needed  Hypothyroidism Continue Synthroid  GERD PPI  OSA Continue CPAP  History of possible ?NPH Memory loss Delirium precautions Continue to follow-up with outpatient neurologist Ativan as needed, start Seroquel prn at bedtime       Malnutrition Type:      Malnutrition Characteristics:      Nutrition Interventions:       Estimated body mass index is 24.14 kg/m as calculated from the following:   Height as of this encounter: 5\' 5"  (1.651 m).   Weight as of this encounter: 65.8 kg.      Code  Status: Full  Family Communication: Discussed with daughter on 03/23/2020  Disposition Plan: Status is: Observation  The patient will require care spanning > 2 midnights and should be moved to inpatient because: Inpatient level of care appropriate due to severity of illness  Dispo: The patient is from: Home              Anticipated d/c is to: SNF              Anticipated d/c date is: 1 day               Patient currently is not medically stable to d/c.    Consultants:  None  Procedures:  None  Antimicrobials:  None  DVT prophylaxis: Eliquis   Objective: Vitals:   03/22/20 0756 03/23/20 0600 03/23/20 1109 03/23/20 1407  BP: (!) 119/92 110/87 (!) 187/148 (!) 166/107  Pulse: 71 76  77  Resp: 18     Temp: (!) 97.3 F (36.3 C) 97.9 F (36.6 C)  97.9 F (36.6 C)  TempSrc: Axillary Oral  Oral  SpO2: 97% 96%  94%  Weight:      Height:        Intake/Output Summary (Last 24 hours) at 03/23/2020 1653 Last data filed at 03/23/2020 1540 Gross per 24 hour  Intake 3 ml  Output 1000 ml  Net -997 ml   Filed Weights   03/20/20 0917  Weight: 65.8 kg    Exam:  General: NAD, drowsy  Cardiovascular: S1, S2 present  Respiratory: CTAB  Abdomen: Soft, nontender, nondistended, bowel sounds present  Musculoskeletal: No bilateral pedal edema noted, bilateral foot placed in boots  Skin: Normal  Psychiatry:  Intermittent confusion noted   Data Reviewed: CBC: Recent Labs  Lab 03/20/20 0849 03/20/20 0850 03/21/20 0222 03/22/20 0057 03/23/20 0218  WBC 9.9  --  8.7 8.4 9.7  NEUTROABS 6.9  --   --  5.9 7.1  HGB 11.7* 12.6 11.1* 11.8* 12.0  HCT 36.7 37.0 32.7* 35.6* 35.7*  MCV 95.1  --  92.4 91.5 91.1  PLT 203  --  215 208 341   Basic Metabolic Panel: Recent Labs  Lab 03/20/20 0849 03/20/20 0850 03/21/20 0222 03/22/20 0554 03/23/20 0218  NA 138 141 138 132* 134*  K 3.2* 3.2* 3.3* 4.0 3.4*  CL 101 100 102 99 100  CO2 28  --  25 24 23   GLUCOSE 115* 109* 114* 159* 113*  BUN 21 25* 18 12 11   CREATININE 0.65 0.50 0.68 0.57 0.53  CALCIUM 8.8*  --  9.2 8.8* 9.1  MG  --   --  1.9  --   --    GFR: Estimated Creatinine Clearance: 53 mL/min (by C-G formula based on SCr of 0.53 mg/dL). Liver Function Tests: Recent Labs  Lab 03/20/20 0849  AST 26  ALT 18  ALKPHOS 41  BILITOT 0.8  PROT 6.0*  ALBUMIN 3.3*   No results for input(s): LIPASE, AMYLASE in the  last 168 hours. No results for input(s): AMMONIA in the last 168 hours. Coagulation Profile: Recent Labs  Lab 03/20/20 0849  INR 1.0   Cardiac Enzymes: No results for input(s): CKTOTAL, CKMB, CKMBINDEX, TROPONINI in the last 168 hours. BNP (last 3 results) No results for input(s): PROBNP in the last 8760 hours. HbA1C: No results for input(s): HGBA1C in the last 72 hours. CBG: Recent Labs  Lab 03/20/20 0844  GLUCAP 109*   Lipid Profile: No results for input(s): CHOL, HDL, LDLCALC, TRIG,  CHOLHDL, LDLDIRECT in the last 72 hours. Thyroid Function Tests: Recent Labs    03/21/20 1219  TSH 0.974  FREET4 0.89   Anemia Panel: No results for input(s): VITAMINB12, FOLATE, FERRITIN, TIBC, IRON, RETICCTPCT in the last 72 hours. Urine analysis:    Component Value Date/Time   COLORURINE YELLOW 03/20/2020 0917   APPEARANCEUR HAZY (A) 03/20/2020 0917   LABSPEC 1.016 03/20/2020 0917   PHURINE 7.0 03/20/2020 0917   GLUCOSEU NEGATIVE 03/20/2020 0917   HGBUR NEGATIVE 03/20/2020 0917   BILIRUBINUR NEGATIVE 03/20/2020 0917   KETONESUR 5 (A) 03/20/2020 0917   PROTEINUR NEGATIVE 03/20/2020 0917   NITRITE NEGATIVE 03/20/2020 0917   LEUKOCYTESUR NEGATIVE 03/20/2020 0917   Sepsis Labs: @LABRCNTIP (procalcitonin:4,lacticidven:4)  ) Recent Results (from the past 240 hour(s))  Resp Panel by RT-PCR (Flu A&B, Covid) Nasopharyngeal Swab     Status: None   Collection Time: 03/20/20  9:17 AM   Specimen: Nasopharyngeal Swab; Nasopharyngeal(NP) swabs in vial transport medium  Result Value Ref Range Status   SARS Coronavirus 2 by RT PCR NEGATIVE NEGATIVE Final    Comment: (NOTE) SARS-CoV-2 target nucleic acids are NOT DETECTED.  The SARS-CoV-2 RNA is generally detectable in upper respiratory specimens during the acute phase of infection. The lowest concentration of SARS-CoV-2 viral copies this assay can detect is 138 copies/mL. A negative result does not preclude SARS-Cov-2 infection and  should not be used as the sole basis for treatment or other patient management decisions. A negative result may occur with  improper specimen collection/handling, submission of specimen other than nasopharyngeal swab, presence of viral mutation(s) within the areas targeted by this assay, and inadequate number of viral copies(<138 copies/mL). A negative result must be combined with clinical observations, patient history, and epidemiological information. The expected result is Negative.  Fact Sheet for Patients:  EntrepreneurPulse.com.au  Fact Sheet for Healthcare Providers:  IncredibleEmployment.be  This test is no t yet approved or cleared by the [place] FDA and  has been authorized for detection and/or diagnosis of SARS-CoV-2 by FDA under an Emergency Use Authorization (EUA). This EUA will remain  in effect (meaning this test can be used) for the duration of the COVID-19 declaration under Section 564(b)(1) of the Act, 21 U.S.C.section 360bbb-3(b)(1), unless the authorization is terminated  or revoked sooner.       Influenza A by PCR NEGATIVE NEGATIVE Final   Influenza B by PCR NEGATIVE NEGATIVE Final    Comment: (NOTE) The Xpert Xpress SARS-CoV-2/FLU/RSV plus assay is intended as an aid in the diagnosis of influenza from Nasopharyngeal swab specimens and should not be used as a sole basis for treatment. Nasal washings and aspirates are unacceptable for Xpert Xpress SARS-CoV-2/FLU/RSV testing.  Fact Sheet for Patients: EntrepreneurPulse.com.au  Fact Sheet for Healthcare Providers: IncredibleEmployment.be  This test is not yet approved or cleared by the [place] FDA and has been authorized for detection and/or diagnosis of SARS-CoV-2 by FDA under an Emergency Use Authorization (EUA). This EUA will remain in effect (meaning this test can be used) for the duration of the COVID-19 declaration  under Section 564(b)(1) of the Act, 21 U.S.C. section 360bbb-3(b)(1), unless the authorization is terminated or revoked.  Performed at [facility], [place] [address] [place], [place] [address]   SARS Coronavirus 2 by RT PCR (hospital order, performed in [facility] hospital lab) Nasopharyngeal Nasopharyngeal Swab     Status: None   Collection Time: 03/21/20  4:55 PM   Specimen: Nasopharyngeal Swab  Result Value Ref  Range Status   SARS Coronavirus 2 NEGATIVE NEGATIVE Final    Comment: (NOTE) SARS-CoV-2 target nucleic acids are NOT DETECTED.  The SARS-CoV-2 RNA is generally detectable in upper and lower respiratory specimens during the acute phase of infection. The lowest concentration of SARS-CoV-2 viral copies this assay can detect is 250 copies / mL. A negative result does not preclude SARS-CoV-2 infection and should not be used as the sole basis for treatment or other patient management decisions.  A negative result may occur with improper specimen collection / handling, submission of specimen other than nasopharyngeal swab, presence of viral mutation(s) within the areas targeted by this assay, and inadequate number of viral copies (<250 copies / mL). A negative result must be combined with clinical observations, patient history, and epidemiological information.  Fact Sheet for Patients:   StrictlyIdeas.no  Fact Sheet for Healthcare Providers: BankingDealers.co.za  This test is not yet approved or  cleared by the [place] FDA and has been authorized for detection and/or diagnosis of SARS-CoV-2 by FDA under an Emergency Use Authorization (EUA).  This EUA will remain in effect (meaning this test can be used) for the duration of the COVID-19 declaration under Section 564(b)(1) of the Act, 21 U.S.C. section 360bbb-3(b)(1), unless the authorization is terminated or revoked sooner.  Performed at [facility], [place] [address], [place], [place] [address]       Studies: No results found.  Scheduled Meds: . acetaminophen  500 mg Oral BID  . apixaban  5 mg Oral BID  . atorvastatin  10 mg Oral Daily  . calcium-vitamin D  1 tablet Oral BID  . cholecalciferol  1,000 Units Oral Daily  . cycloSPORINE  1 drop Both Eyes BID  . desmopressin  600 mcg Oral QHS  . diltiazem  120 mg Oral Daily  . DULoxetine  30 mg Oral BID  . ibuprofen  400 mg Oral BID  . melatonin  6 mg Oral QHS  . nitrofurantoin  100 mg Oral Daily  . pantoprazole  40 mg Oral Daily  . QUEtiapine  25 mg Oral QHS  . sodium chloride flush  3 mL Intravenous Once  . sodium chloride flush  3 mL Intravenous Q12H  . thyroid  60 mg Oral QAC breakfast    Continuous Infusions: . sodium chloride       LOS: 0 days     Alma Friendly, MD [facility]  If 7PM-7AM, please contact night-coverage [email or web address] 03/23/2020, 4:53 PM

## 2020-03-23 NOTE — Telephone Encounter (Signed)
Ms. Warth is in the hospital due to confusion.  Her daughter called me this afternoon.  At baseline, Ms. Pantaleo has minimal cognitive dysfunction (scored 27/30 on the Mini-Mental Status exam 12/13/2019.  She does appear to have normal pressure hydrocephalus.  She had a high-volume spinal tap 03/04/2020 and had improvement of her gait.  Additionally there may have been mild improvement of cognition with improved processing.  Therefore, she was referred to neurosurgery to consider ventriculostomy shunt.  Her daughter, Malachy Mood, notes that she is more confused today than she was at admission 2 days ago.  She is seeing things that are not present.  Laboratory tests were unremarkable and CT scan was stable.  I advised the daughter to discuss further with the hospitalist working with her mom and they may want to consider asking neurology to reevaluate as she has worsened.

## 2020-03-23 NOTE — Progress Notes (Signed)
Called by primary team, neurosurgical eval pending as an outpatient for VPS consideration for NPH sx that are LP responsive. From the description, she has had an acute worsening with some visual hallucinations, consistent with an encephalopathy picture. This raises the concern of a coexisting pathology worsening her mental status, as acute worsening over days is not seen in NPH. Shunt placement at this time would obviously not treat the co-existing pathology and we would not be able to see if she was shunt responsive or not. In an acute illness, there is also likely an increased risk of perioperative complications from shunt placement that I would like for her to avoid if possible. I would recommend continuing to evaluate for an alternative cause of altered mental status and, after recovery to her recent baseline, discussion regarding shunt placement. If she continues to decline and no cause is found, please let me know and we can re-discuss accordingly.

## 2020-03-24 DIAGNOSIS — Y92009 Unspecified place in unspecified non-institutional (private) residence as the place of occurrence of the external cause: Secondary | ICD-10-CM | POA: Diagnosis not present

## 2020-03-24 DIAGNOSIS — W06XXXA Fall from bed, initial encounter: Secondary | ICD-10-CM | POA: Diagnosis present

## 2020-03-24 DIAGNOSIS — H9190 Unspecified hearing loss, unspecified ear: Secondary | ICD-10-CM | POA: Diagnosis present

## 2020-03-24 DIAGNOSIS — G928 Other toxic encephalopathy: Secondary | ICD-10-CM | POA: Diagnosis present

## 2020-03-24 DIAGNOSIS — R262 Difficulty in walking, not elsewhere classified: Secondary | ICD-10-CM | POA: Diagnosis present

## 2020-03-24 DIAGNOSIS — E876 Hypokalemia: Secondary | ICD-10-CM | POA: Diagnosis present

## 2020-03-24 DIAGNOSIS — R269 Unspecified abnormalities of gait and mobility: Secondary | ICD-10-CM | POA: Diagnosis not present

## 2020-03-24 DIAGNOSIS — I4891 Unspecified atrial fibrillation: Secondary | ICD-10-CM | POA: Diagnosis present

## 2020-03-24 DIAGNOSIS — I700 Atherosclerosis of aorta: Secondary | ICD-10-CM | POA: Diagnosis present

## 2020-03-24 DIAGNOSIS — N3281 Overactive bladder: Secondary | ICD-10-CM | POA: Diagnosis present

## 2020-03-24 DIAGNOSIS — K219 Gastro-esophageal reflux disease without esophagitis: Secondary | ICD-10-CM

## 2020-03-24 DIAGNOSIS — Z8249 Family history of ischemic heart disease and other diseases of the circulatory system: Secondary | ICD-10-CM | POA: Diagnosis not present

## 2020-03-24 DIAGNOSIS — G4733 Obstructive sleep apnea (adult) (pediatric): Secondary | ICD-10-CM | POA: Diagnosis present

## 2020-03-24 DIAGNOSIS — Z87891 Personal history of nicotine dependence: Secondary | ICD-10-CM | POA: Diagnosis not present

## 2020-03-24 DIAGNOSIS — I480 Paroxysmal atrial fibrillation: Secondary | ICD-10-CM | POA: Diagnosis present

## 2020-03-24 DIAGNOSIS — F0390 Unspecified dementia without behavioral disturbance: Secondary | ICD-10-CM | POA: Diagnosis present

## 2020-03-24 DIAGNOSIS — R2689 Other abnormalities of gait and mobility: Secondary | ICD-10-CM | POA: Diagnosis present

## 2020-03-24 DIAGNOSIS — E785 Hyperlipidemia, unspecified: Secondary | ICD-10-CM | POA: Diagnosis present

## 2020-03-24 DIAGNOSIS — Z20822 Contact with and (suspected) exposure to covid-19: Secondary | ICD-10-CM | POA: Diagnosis present

## 2020-03-24 DIAGNOSIS — G9389 Other specified disorders of brain: Secondary | ICD-10-CM | POA: Diagnosis present

## 2020-03-24 DIAGNOSIS — G9340 Encephalopathy, unspecified: Secondary | ICD-10-CM | POA: Diagnosis not present

## 2020-03-24 DIAGNOSIS — Z87440 Personal history of urinary (tract) infections: Secondary | ICD-10-CM | POA: Diagnosis not present

## 2020-03-24 DIAGNOSIS — E871 Hypo-osmolality and hyponatremia: Secondary | ICD-10-CM | POA: Diagnosis present

## 2020-03-24 DIAGNOSIS — T402X5A Adverse effect of other opioids, initial encounter: Secondary | ICD-10-CM | POA: Diagnosis present

## 2020-03-24 DIAGNOSIS — Z85820 Personal history of malignant melanoma of skin: Secondary | ICD-10-CM | POA: Diagnosis not present

## 2020-03-24 DIAGNOSIS — I10 Essential (primary) hypertension: Secondary | ICD-10-CM | POA: Diagnosis present

## 2020-03-24 DIAGNOSIS — Z888 Allergy status to other drugs, medicaments and biological substances status: Secondary | ICD-10-CM | POA: Diagnosis not present

## 2020-03-24 DIAGNOSIS — E039 Hypothyroidism, unspecified: Secondary | ICD-10-CM | POA: Diagnosis present

## 2020-03-24 LAB — CBC WITH DIFFERENTIAL/PLATELET
Abs Immature Granulocytes: 0.03 10*3/uL (ref 0.00–0.07)
Basophils Absolute: 0.1 10*3/uL (ref 0.0–0.1)
Basophils Relative: 1 %
Eosinophils Absolute: 0.3 10*3/uL (ref 0.0–0.5)
Eosinophils Relative: 4 %
HCT: 35.8 % — ABNORMAL LOW (ref 36.0–46.0)
Hemoglobin: 12.4 g/dL (ref 12.0–15.0)
Immature Granulocytes: 0 %
Lymphocytes Relative: 22 %
Lymphs Abs: 1.9 10*3/uL (ref 0.7–4.0)
MCH: 31.2 pg (ref 26.0–34.0)
MCHC: 34.6 g/dL (ref 30.0–36.0)
MCV: 89.9 fL (ref 80.0–100.0)
Monocytes Absolute: 0.8 10*3/uL (ref 0.1–1.0)
Monocytes Relative: 9 %
Neutro Abs: 5.7 10*3/uL (ref 1.7–7.7)
Neutrophils Relative %: 64 %
Platelets: 278 10*3/uL (ref 150–400)
RBC: 3.98 MIL/uL (ref 3.87–5.11)
RDW: 12.9 % (ref 11.5–15.5)
WBC: 8.8 10*3/uL (ref 4.0–10.5)
nRBC: 0 % (ref 0.0–0.2)

## 2020-03-24 LAB — BASIC METABOLIC PANEL
Anion gap: 11 (ref 5–15)
BUN: 21 mg/dL (ref 8–23)
CO2: 22 mmol/L (ref 22–32)
Calcium: 9.6 mg/dL (ref 8.9–10.3)
Chloride: 97 mmol/L — ABNORMAL LOW (ref 98–111)
Creatinine, Ser: 0.64 mg/dL (ref 0.44–1.00)
GFR, Estimated: >60 mL/min (ref >60)
Glucose, Bld: 103 mg/dL — ABNORMAL HIGH (ref 70–99)
Potassium: 3.7 mmol/L (ref 3.5–5.1)
Sodium: 130 mmol/L — ABNORMAL LOW (ref 135–145)

## 2020-03-24 LAB — VITAMIN B12: Vitamin B-12: 2904 pg/mL — ABNORMAL HIGH (ref 180–914)

## 2020-03-24 LAB — RPR: RPR Ser Ql: NONREACTIVE

## 2020-03-24 MED ORDER — SODIUM CHLORIDE 0.9 % IV SOLN
1.0000 g | INTRAVENOUS | Status: DC
  Administered 2020-03-24 – 2020-03-26 (×3): 1 g via INTRAVENOUS
  Filled 2020-03-24 (×3): qty 10

## 2020-03-24 MED ORDER — SODIUM CHLORIDE 0.9 % IV SOLN
500.0000 mg | INTRAVENOUS | Status: DC
  Administered 2020-03-24 – 2020-03-26 (×3): 500 mg via INTRAVENOUS
  Filled 2020-03-24 (×3): qty 500

## 2020-03-24 NOTE — Progress Notes (Signed)
PROGRESS NOTE  Cynthia [name] KGU:[id number] DOB: [birth year] DOA: 03/20/2020 PCP: Deland Pretty, MD  HPI/Recap of past 24 hours: HPI from Dr Merleen Milliner Cynthia [name] is a 79 y.o. female with PMH sig for PAF, exostosis of both feet status post surgery by podiatry on 03/18/2020, HL, GERD, OSA on CPAP, mild dementia and dizziness with gait abnormality presently being worked up for NPH was in her usual state of health until was noted to be confused by her family. Pt c/o significant pain in her feet with surgery, was given oxycodone and then 2 hours later was found sitting on the side of the bed with slurred speech and significant confusion.  Patient was called in as a code stroke to the ED. Patient herself states she does remember what happened.  Notes "I was slipping and sliding on the side of the bed". Patient thinks that she has been able to get up and go to the bathroom at home since her surgery.  Daughter however is requesting placement in rehab as they are having difficulty managing her since her surgery and her difficulty with gait and pain. The daughter believes that patient forgets that she has had surgery and tries to get out of bed and immediately falls due to the pain which she does not remember that she has. In the ED, neurology felt this was more likely toxic metabolic encephalopathy most likely secondary to oxycodone and not using her CPAP overnight.  Emergent head CT was negative for any acute intracranial process.  Patient was however noted to be in RVR with heart rate of 150 in the ED, difficult to control, so pt was admitted. Of note patient has been off her Eliquis due to her recent foot surgery.    Today, met patient sitting up and having breakfast, noted to be oriented. Reports cough, denies any chest pain, abdominal pain, nausea/vomiting, fever/chills.    Assessment/Plan: Principal Problem:   Atrial fibrillation with RVR (HCC) Active Problems:   OSA on CPAP   Essential  hypertension, benign   GERD (gastroesophageal reflux disease)   History of recurrent UTIs   Memory loss   Postural dizziness with presyncope   Gait disturbance   Paroxysmal A-fib (HCC)   Paroxysmal A. fib with RVR Rate controlled currently off diltiazem drip Continue p.o. Cardizem, Eliquis TSH, free T4 WNL  Acute metabolic encephalopathy possibly 2/2 CAP Vs narcotic induced Reported cough, sat well on RA Improved mentation Hx of Memory loss, ?NPH UA neg, UC pending BC 1 out of 4 grew gram-positive rods, likely contaminant, repeat pending RPR negative, Vit B12 WNL CXR showed small patchy/nodular opacities in the left upper lung zone  CT head on admission, unremarkable, no evidence of acute intracranial abnormality, noted ventriculomegaly Reconsulted neurology, recommend neurosurgery consult Discussed with neurosurgery Dr. Venetia Constable on 03/23/2020, does not think acute encephalopathy is due to/related to NPH, and placement of a shunt is not indicated Status IV ceftriaxone, azithromycin Monitor closely  Hyponatremia Daily CBC  Difficulty with ambulation status post bilateral foot surgery Patient is s/p bilateral removal of exostosis on 03/18/20 PT/OT consulted-SNF placement Fall precautions  Hypokalemia Replace as needed  Hypothyroidism Continue Synthroid  GERD PPI  OSA Continue CPAP  History of possible ?NPH Memory loss Delirium precautions Continue to follow-up with outpatient neurologist Ativan as needed, start Seroquel prn at bedtime       Malnutrition Type:      Malnutrition Characteristics:      Nutrition Interventions:  Estimated body mass index is 26.96 kg/m as calculated from the following:   Height as of this encounter: '5\' 5"'  (1.651 m).   Weight as of this encounter: 73.5 kg.      Code Status: Full  Family Communication: Discussed with daughter on 03/23/2020  Disposition Plan: Status is: Inpatient  The patient will  require care spanning > 2 midnights and should be moved to inpatient because: Inpatient level of care appropriate due to severity of illness  Dispo: The patient is from: Home              Anticipated d/c is to: SNF              Anticipated d/c date is: 1 day              Patient currently is not medically stable to d/c.    Consultants:  Neurology  Neurosurgery  Procedures:  None  Antimicrobials:  Ceftriaxone  Azithromycin  DVT prophylaxis: Eliquis   Objective: Vitals:   03/23/20 1955 03/23/20 2315 03/24/20 0415 03/24/20 0632  BP: (!) 169/97 (!) 159/88 (!) 169/97   Pulse: 89 94 73   Resp: 14 14 (!) 22   Temp: 98.4 F (36.9 C) 98.4 F (36.9 C) 97.7 F (36.5 C)   TempSrc: Oral Oral Oral   SpO2: 96% 91% 97%   Weight:    73.5 kg  Height:        Intake/Output Summary (Last 24 hours) at 03/24/2020 1500 Last data filed at 03/24/2020 1018 Gross per 24 hour  Intake 123 ml  Output 450 ml  Net -327 ml   Filed Weights   03/20/20 0917 03/24/20 0632  Weight: 65.8 kg 73.5 kg    Exam:  General: NAD, oriented  Cardiovascular: S1, S2 present  Respiratory: CTAB  Abdomen: Soft, nontender, nondistended, bowel sounds present  Musculoskeletal: No bilateral pedal edema noted, bilateral foot placed in boots  Skin: Normal  Psychiatry:  Normal mood   Data Reviewed: CBC: Recent Labs  Lab 03/20/20 0849 03/20/20 0850 03/21/20 0222 03/22/20 0057 03/23/20 0218 03/24/20 0541  WBC 9.9  --  8.7 8.4 9.7 8.8  NEUTROABS 6.9  --   --  5.9 7.1 5.7  HGB 11.7* 12.6 11.1* 11.8* 12.0 12.4  HCT 36.7 37.0 32.7* 35.6* 35.7* 35.8*  MCV 95.1  --  92.4 91.5 91.1 89.9  PLT 203  --  215 208 228 315   Basic Metabolic Panel: Recent Labs  Lab 03/20/20 0849 03/20/20 0850 03/21/20 0222 03/22/20 0554 03/23/20 0218 03/24/20 0541  NA 138 141 138 132* 134* 130*  K 3.2* 3.2* 3.3* 4.0 3.4* 3.7  CL 101 100 102 99 100 97*  CO2 28  --  '25 24 23 22  ' GLUCOSE 115* 109* 114* 159*  113* 103*  BUN 21 25* '18 12 11 21  ' CREATININE 0.65 0.50 0.68 0.57 0.53 0.64  CALCIUM 8.8*  --  9.2 8.8* 9.1 9.6  MG  --   --  1.9  --   --   --    GFR: Estimated Creatinine Clearance: 59.1 mL/min (by C-G formula based on SCr of 0.64 mg/dL). Liver Function Tests: Recent Labs  Lab 03/20/20 0849  AST 26  ALT 18  ALKPHOS 41  BILITOT 0.8  PROT 6.0*  ALBUMIN 3.3*   No results for input(s): LIPASE, AMYLASE in the last 168 hours. Recent Labs  Lab 03/23/20 2039  AMMONIA 24   Coagulation Profile: Recent Labs  Lab 03/20/20  0849  INR 1.0   Cardiac Enzymes: No results for input(s): CKTOTAL, CKMB, CKMBINDEX, TROPONINI in the last 168 hours. BNP (last 3 results) No results for input(s): PROBNP in the last 8760 hours. HbA1C: No results for input(s): HGBA1C in the last 72 hours. CBG: Recent Labs  Lab 03/20/20 0844  GLUCAP 109*   Lipid Profile: No results for input(s): CHOL, HDL, LDLCALC, TRIG, CHOLHDL, LDLDIRECT in the last 72 hours. Thyroid Function Tests: No results for input(s): TSH, T4TOTAL, FREET4, T3FREE, THYROIDAB in the last 72 hours. Anemia Panel: Recent Labs    03/24/20 0541  VITAMINB12 2,904*   Urine analysis:    Component Value Date/Time   COLORURINE AMBER (A) 03/23/2020 2236   APPEARANCEUR CLEAR 03/23/2020 2236   LABSPEC 1.015 03/23/2020 2236   PHURINE 7.0 03/23/2020 2236   GLUCOSEU NEGATIVE 03/23/2020 2236   HGBUR NEGATIVE 03/23/2020 2236   BILIRUBINUR NEGATIVE 03/23/2020 2236   KETONESUR 80 (A) 03/23/2020 2236   PROTEINUR 100 (A) 03/23/2020 2236   NITRITE NEGATIVE 03/23/2020 2236   LEUKOCYTESUR NEGATIVE 03/23/2020 2236   Sepsis Labs: '@LABRCNTIP' (procalcitonin:4,lacticidven:4)  ) Recent Results (from the past 240 hour(s))  Resp Panel by RT-PCR (Flu A&B, Covid) Nasopharyngeal Swab     Status: None   Collection Time: 03/20/20  9:17 AM   Specimen: Nasopharyngeal Swab; Nasopharyngeal(NP) swabs in vial transport medium  Result Value Ref Range Status    SARS Coronavirus 2 by RT PCR NEGATIVE NEGATIVE Final    Comment: (NOTE) SARS-CoV-2 target nucleic acids are NOT DETECTED.  The SARS-CoV-2 RNA is generally detectable in upper respiratory specimens during the acute phase of infection. The lowest concentration of SARS-CoV-2 viral copies this assay can detect is 138 copies/mL. A negative result does not preclude SARS-Cov-2 infection and should not be used as the sole basis for treatment or other patient management decisions. A negative result may occur with  improper specimen collection/handling, submission of specimen other than nasopharyngeal swab, presence of viral mutation(s) within the areas targeted by this assay, and inadequate number of viral copies(<138 copies/mL). A negative result must be combined with clinical observations, patient history, and epidemiological information. The expected result is Negative.  Fact Sheet for Patients:  EntrepreneurPulse.com.au  Fact Sheet for Healthcare Providers:  IncredibleEmployment.be  This test is no t yet approved or cleared by the [place] FDA and  has been authorized for detection and/or diagnosis of SARS-CoV-2 by FDA under an Emergency Use Authorization (EUA). This EUA will remain  in effect (meaning this test can be used) for the duration of the COVID-19 declaration under Section 564(b)(1) of the Act, 21 U.S.C.section 360bbb-3(b)(1), unless the authorization is terminated  or revoked sooner.       Influenza A by PCR NEGATIVE NEGATIVE Final   Influenza B by PCR NEGATIVE NEGATIVE Final    Comment: (NOTE) The Xpert Xpress SARS-CoV-2/FLU/RSV plus assay is intended as an aid in the diagnosis of influenza from Nasopharyngeal swab specimens and should not be used as a sole basis for treatment. Nasal washings and aspirates are unacceptable for Xpert Xpress SARS-CoV-2/FLU/RSV testing.  Fact Sheet for  Patients: EntrepreneurPulse.com.au  Fact Sheet for Healthcare Providers: IncredibleEmployment.be  This test is not yet approved or cleared by the [place] FDA and has been authorized for detection and/or diagnosis of SARS-CoV-2 by FDA under an Emergency Use Authorization (EUA). This EUA will remain in effect (meaning this test can be used) for the duration of the COVID-19 declaration under Section 564(b)(1) of the Act, 21 U.S.C.  section 360bbb-3(b)(1), unless the authorization is terminated or revoked.  Performed at [facility], [place] [address] [place], [place] [address]   SARS Coronavirus 2 by RT PCR (hospital order, performed in [facility] hospital lab) Nasopharyngeal Nasopharyngeal Swab     Status: None   Collection Time: 03/21/20  4:55 PM   Specimen: Nasopharyngeal Swab  Result Value Ref Range Status   SARS Coronavirus 2 NEGATIVE NEGATIVE Final    Comment: (NOTE) SARS-CoV-2 target nucleic acids are NOT DETECTED.  The SARS-CoV-2 RNA is generally detectable in upper and lower respiratory specimens during the acute phase of infection. The lowest concentration of SARS-CoV-2 viral copies this assay can detect is 250 copies / mL. A negative result does not preclude SARS-CoV-2 infection and should not be used as the sole basis for treatment or other patient management decisions.  A negative result may occur with improper specimen collection / handling, submission of specimen other than nasopharyngeal swab, presence of viral mutation(s) within the areas targeted by this assay, and inadequate number of viral copies (<250 copies / mL). A negative result must be combined with clinical observations, patient history, and epidemiological information.  Fact Sheet for Patients:   StrictlyIdeas.no  Fact Sheet for Healthcare Providers: BankingDealers.co.za  This test is not yet approved or   cleared by the [place] FDA and has been authorized for detection and/or diagnosis of SARS-CoV-2 by FDA under an Emergency Use Authorization (EUA).  This EUA will remain in effect (meaning this test can be used) for the duration of the COVID-19 declaration under Section 564(b)(1) of the Act, 21 U.S.C. section 360bbb-3(b)(1), unless the authorization is terminated or revoked sooner.  Performed at [facility], [place] [address], [place], [place] [address]   Culture, blood (routine x 2)     Status: None   Collection Time: 03/23/20  8:14 PM   Specimen: BLOOD  Result Value Ref Range Status   Specimen Description BLOOD RIGHT ANTECUBITAL  Final   Special Requests   Final    BOTTLES DRAWN AEROBIC AND ANAEROBIC Blood Culture adequate volume   Culture  Setup Time   Final    GRAM POSITIVE RODS AEROBIC BOTTLE ONLY CRITICAL RESULT CALLED TO, READ BACK BY AND VERIFIED WITH: PHARMD CATHY P. [phone] FCP  Performed at [facility], [place] [address] [place], [place] [address]    Culture GRAM POSITIVE RODS  Final   Report Status 03/24/2020 FINAL  Final  Culture, blood (routine x 2)     Status: None (Preliminary result)   Collection Time: 03/23/20  8:14 PM   Specimen: BLOOD  Result Value Ref Range Status   Specimen Description BLOOD SITE NOT SPECIFIED  Final   Special Requests   Final    BOTTLES DRAWN AEROBIC AND ANAEROBIC Blood Culture results may not be optimal due to an excessive volume of blood received in culture bottles   Culture   Final    NO GROWTH < 24 HOURS Performed at [facility], [place] [address], [place], [place] [address]    Report Status PENDING  Incomplete      Studies: DG Chest Port 1 View  Result Date: 03/23/2020 CLINICAL DATA:  Acute encephalopathy. EXAM: PORTABLE CHEST 1 VIEW COMPARISON:  Radiograph 2 days ago 03/21/2020. Most recent CT 11/06/2017 FINDINGS: Normal heart size. Stable mediastinal contours with aortic atherosclerosis and tortuosity.  Chronic elevation of right hemidiaphragm. Subsegmental atelectasis or scarring at the left lung base unchanged. Small  patchy/nodular opacities in the left upper lung zone, not seen on prior. No pulmonary edema, pleural effusion or pneumothorax. Scoliotic curvature of the upper thoracic spine. No acute osseous abnormalities are seen. IMPRESSION: Small patchy/nodular opacities in the left upper lung zone, new from exam 2 days ago. This may be infectious or inflammatory. Recommend radiographic follow-up to resolution. Electronically Signed   By: Keith Rake M.D.   On: 03/23/2020 18:45    Scheduled Meds: . acetaminophen  500 mg Oral BID  . apixaban  5 mg Oral BID  . atorvastatin  10 mg Oral Daily  . calcium-vitamin D  1 tablet Oral BID  . cholecalciferol  1,000 Units Oral Daily  . cycloSPORINE  1 drop Both Eyes BID  . desmopressin  600 mcg Oral QHS  . diltiazem  120 mg Oral Daily  . DULoxetine  30 mg Oral BID  . ibuprofen  400 mg Oral BID  . melatonin  6 mg Oral QHS  . nitrofurantoin  100 mg Oral Daily  . pantoprazole  40 mg Oral Daily  . sodium chloride flush  3 mL Intravenous Once  . sodium chloride flush  3 mL Intravenous Q12H  . thyroid  60 mg Oral QAC breakfast    Continuous Infusions: . sodium chloride    . azithromycin 500 mg (03/24/20 1156)  . cefTRIAXone (ROCEPHIN)  IV 1 g (03/24/20 1021)     LOS: 0 days     Alma Friendly, MD [facility]  If 7PM-7AM, please contact night-coverage [email or web address] 03/24/2020, 3:00 PM

## 2020-03-24 NOTE — Progress Notes (Signed)
Brief History 79y/o lady who we were consulted on 12/8 for confusion after oxycodone was given for surgical pain s/p bilateral foot surgery. We were asked to see the pt again for "worse confusion". CTH was stable, Labs were unremarkable.   Subjective Today, pt is very bright, alert and briskly orients and has insightful conversations about both her health concerns and current events such as inflation and gas prices. She was able to pass the MME without difficulty. Non-focal exam.  Allergies Allergies  Allergen Reactions  . Avelox [Moxifloxacin Hcl In Nacl] Other (See Comments)    Unknown reaction  . Alendronate Sodium Nausea Only    Home Medications Medications Prior to Admission  Medication Sig Dispense Refill  . acetaminophen (TYLENOL) 500 MG tablet Take 500 mg by mouth 2 (two) times daily.     . Alpha-Lipoic Acid (LIPOIC ACID PO) Take 2 tablets by mouth 2 (two) times daily.     Marland Kitchen atorvastatin (LIPITOR) 10 MG tablet TAKE 1 TABLET BY MOUTH EVERY DAY (Patient taking differently: Take 10 mg by mouth 2 (two) times daily. ) 90 tablet 3  . Calcium Carbonate-Vitamin D3 600-400 MG-UNIT TABS Take 1 tablet by mouth 2 (two) times a day.     . cholecalciferol (VITAMIN D) 1000 UNITS tablet Take 1,000 Units by mouth daily.    . cycloSPORINE (RESTASIS) 0.05 % ophthalmic emulsion Place 1 drop into both eyes 2 (two) times daily.     Marland Kitchen desmopressin (DDAVP) 0.2 MG tablet Take 0.6 mg by mouth at bedtime.     Marland Kitchen diltiazem (CARDIZEM CD) 120 MG 24 hr capsule TAKE 1 CAPSULE BY MOUTH EVERY DAY (Patient taking differently: Take 120 mg by mouth daily. ) 90 capsule 1  . DULoxetine (CYMBALTA) 30 MG capsule TAKE 1 CAPSULE BY MOUTH TWICE A DAY (Patient taking differently: Take 30 mg by mouth daily. ) 180 capsule 3  . ELIQUIS 5 MG TABS tablet TAKE 1 TABLET BY MOUTH TWICE A DAY (Patient taking differently: Take 5 mg by mouth 2 (two) times daily. ) 60 tablet 5  . estradiol (ESTRACE) 0.1 MG/GM vaginal cream Place 1  Applicatorful vaginally at bedtime.    . fluticasone (FLONASE) 50 MCG/ACT nasal spray Place 1 spray into the nose daily as needed for allergies.     Marland Kitchen GLUCOSAMINE-CHONDROITIN PO Take 2 tablets by mouth daily with lunch.     . ibuprofen (ADVIL) 200 MG tablet Take 400 mg by mouth 2 (two) times daily.     . meclizine (ANTIVERT) 25 MG tablet Take 1 tablet by mouth daily as needed for dizziness.     . Melatonin 5 MG CAPS Take 5 mg by mouth at bedtime.     . Multiple Vitamin (MULTIVITAMIN WITH MINERALS) TABS tablet Take 1 tablet by mouth daily.    . nitrofurantoin (MACRODANTIN) 100 MG capsule Take 100 mg by mouth daily.     . nitroGLYCERIN (NITROLINGUAL) 0.4 MG/SPRAY spray Place 1 spray under the tongue every 5 (five) minutes x 3 doses as needed for chest pain. (Patient taking differently: Place 1 spray under the tongue every 5 (five) minutes x 3 doses as needed for chest pain (max 3 doses). ) 12 g 1  . Omega-3 1000 MG CAPS Take 2,000 mg by mouth daily.     Marland Kitchen oxyCODONE-acetaminophen (PERCOCET) 5-325 MG tablet Take 1-2 tablets by mouth every 4 (four) hours as needed for severe pain. (Patient taking differently: Take 1 tablet by mouth every 8 (eight) hours as needed for  severe pain. ) 30 tablet 0  . pantoprazole (PROTONIX) 40 MG tablet Take 40 mg by mouth daily before breakfast.   1  . thyroid (ARMOUR) 60 MG tablet Take 60 mg by mouth daily before breakfast.    . tiZANidine (ZANAFLEX) 4 MG tablet Take 1 tablet (4 mg total) by mouth 3 (three) times daily as needed. (Patient taking differently: Take 4 mg by mouth 3 (three) times daily as needed for muscle spasms. ) 90 tablet 5  . vitamin C (ASCORBIC ACID) 500 MG tablet Take 500 mg by mouth daily.     Marland Kitchen etodolac (LODINE XL) 400 MG 24 hr tablet Take 1 tablet by mouth twice daily. DO NOT TAKE WITH ANY OTHER NSAIDS (Patient not taking: Reported on 03/20/2020) 60 tablet 1  . ibuprofen (ADVIL) 800 MG tablet Take 1 tablet (800 mg total) by mouth every 6 (six) hours as  needed. (Patient not taking: Reported on 03/20/2020) 60 tablet 1    Hospital Medications . acetaminophen  500 mg Oral BID  . apixaban  5 mg Oral BID  . atorvastatin  10 mg Oral Daily  . calcium-vitamin D  1 tablet Oral BID  . cholecalciferol  1,000 Units Oral Daily  . cycloSPORINE  1 drop Both Eyes BID  . desmopressin  600 mcg Oral QHS  . diltiazem  120 mg Oral Daily  . DULoxetine  30 mg Oral BID  . ibuprofen  400 mg Oral BID  . melatonin  6 mg Oral QHS  . nitrofurantoin  100 mg Oral Daily  . pantoprazole  40 mg Oral Daily  . sodium chloride flush  3 mL Intravenous Once  . sodium chloride flush  3 mL Intravenous Q12H  . thyroid  60 mg Oral QAC breakfast     Objective  Intake/Output from previous day: 12/11 0701 - 12/12 0700 In: 3 [I.V.:3] Out: 1450 [Urine:1450] Intake/Output this shift: No intake/output data recorded. Nutritional status:  Diet Order            Diet Heart Room service appropriate? Yes; Fluid consistency: Thin  Diet effective now                  Physical Exam -  Vitals:   03/23/20 1955 03/23/20 2315 03/24/20 0415 03/24/20 0632  BP: (!) 169/97 (!) 159/88 (!) 169/97   Pulse: 89 94 73   Resp: 14 14 (!) 22   Temp: 98.4 F (36.9 C) 98.4 F (36.9 C) 97.7 F (36.5 C)   TempSrc: Oral Oral Oral   SpO2: 96% 91% 97%   Weight:    73.5 kg  Height:       General -  No acute distress Heart - Regular rate and rhythm - no murmer Lungs - Clear to auscultation Abdomen - Soft - non tender Extremities - Distal pulses intact - no edema Skin - Warm and dry  Neurologic Exam:   Mental Status:  Alert, oriented, thought content appropriate. Speech without evidence of dysarthria or aphasia. Able to follow 3 step commands without difficulty. MME 3/3 at 5 min. Able to quickly name 4/5 animals starting with letter D, got the 5th one with only one clue. Briskly spells the word "WORLD" backwards.  Cranial Nerves:  II-bilateral visual fields intact III/IV/VI-Pupils  were equal and reacted. Extraocular movements were full.  V/VII-no facial numbness and no facial weakness.  VIII-hearing normal.  X-normal speech and symmetrical palatal movement.  XII-midline tongue extension  Motor: Right : Upper extremity  5/5    Left:     Upper extremity   5/5  Lower extremity   5/5     Lower extremity   5/5 Tone and bulk:normal tone throughout; no atrophy noted Sensory: Intact to light touch in all extremities. Deep Tendon Reflexes: 2/4 throughout Plantars: Downgoing bilaterally  Cerebellar: Normal finger to nose and heel to shin bilaterally. Gait: not tested  LABORATORY RESULTS:  Basic Metabolic Panel: Recent Labs  Lab 03/20/20 0849 03/20/20 0850 03/21/20 0222 03/22/20 0554 03/23/20 0218 03/24/20 0541  NA 138 141 138 132* 134* 130*  K 3.2* 3.2* 3.3* 4.0 3.4* 3.7  CL 101 100 102 99 100 97*  CO2 28  --  25 24 23 22   GLUCOSE 115* 109* 114* 159* 113* 103*  BUN 21 25* 18 12 11 21   CREATININE 0.65 0.50 0.68 0.57 0.53 0.64  CALCIUM 8.8*  --  9.2 8.8* 9.1 9.6  MG  --   --  1.9  --   --   --     Liver Function Tests: Recent Labs  Lab 03/20/20 0849  AST 26  ALT 18  ALKPHOS 41  BILITOT 0.8  PROT 6.0*  ALBUMIN 3.3*   No results for input(s): LIPASE, AMYLASE in the last 168 hours. Recent Labs  Lab 03/23/20 2039  AMMONIA 24    CBC: Recent Labs  Lab 03/20/20 0849 03/20/20 0850 03/21/20 0222 03/22/20 0057 03/23/20 0218 03/24/20 0541  WBC 9.9  --  8.7 8.4 9.7 8.8  NEUTROABS 6.9  --   --  5.9 7.1 5.7  HGB 11.7* 12.6 11.1* 11.8* 12.0 12.4  HCT 36.7 37.0 32.7* 35.6* 35.7* 35.8*  MCV 95.1  --  92.4 91.5 91.1 89.9  PLT 203  --  215 208 228 278    Cardiac Enzymes: No results for input(s): CKTOTAL, CKMB, CKMBINDEX, TROPONINI in the last 168 hours.  Lipid Panel: No results for input(s): CHOL, TRIG, HDL, CHOLHDL, VLDL, LDLCALC in the last 168 hours.  CBG: Recent Labs  Lab 03/20/20 0844  GLUCAP 109*    Microbiology:   Coagulation  Studies: No results for input(s): LABPROT, INR in the last 72 hours.  Miscellaneous Labs:   IMAGING RESULTS DG Chest Port 1 View  Result Date: 03/23/2020 CLINICAL DATA:  Acute encephalopathy. EXAM: PORTABLE CHEST 1 VIEW COMPARISON:  Radiograph 2 days ago 03/21/2020. Most recent CT 11/06/2017 FINDINGS: Normal heart size. Stable mediastinal contours with aortic atherosclerosis and tortuosity. Chronic elevation of right hemidiaphragm. Subsegmental atelectasis or scarring at the left lung base unchanged. Small patchy/nodular opacities in the left upper lung zone, not seen on prior. No pulmonary edema, pleural effusion or pneumothorax. Scoliotic curvature of the upper thoracic spine. No acute osseous abnormalities are seen. IMPRESSION: Small patchy/nodular opacities in the left upper lung zone, new from exam 2 days ago. This may be infectious or inflammatory. Recommend radiographic follow-up to resolution. Electronically Signed   By: Keith Rake M.D.   On: 03/23/2020 18:45     Assessment/Plan: This is a 79yr old who presented with confusion after post-op pain medication was given. She is now back to baseline and will need rehab efforts for her post-op recovery . CTH shows stable ventricles, known NPH wk up and plan should be continued as an out pt with f/u with NSGY and Dr. Felecia Shelling. Labs are unremarkable. Her confusion may have been prolonged or wax/waning during her stay due to delirium and consequence of narcotic pain meds. No further recommendations at this time.  I have communicated this to the bedside RN and primary MD, Dr Horris Latino.    Baylor Cortez Metzger-Cihelka, ARNP-C, ANVP-BC Pager: [phone]

## 2020-03-24 NOTE — Plan of Care (Signed)
°  Problem: Education: Goal: Knowledge of General Education information will improve Description: Including pain rating scale, medication(s)/side effects and non-pharmacologic comfort measures Outcome: Progressing   Problem: Health Behavior/Discharge Planning: Goal: Ability to manage health-related needs will improve Outcome: Progressing   Problem: Clinical Measurements: Goal: Ability to maintain clinical measurements within normal limits will improve Outcome: Progressing   Problem: Education: Goal: Knowledge of disease or condition will improve Outcome: Progressing   Problem: Education: Goal: Understanding of medication regimen will improve Outcome: Progressing   Problem: Activity: Goal: Ability to tolerate increased activity will improve Outcome: Progressing

## 2020-03-24 NOTE — Plan of Care (Signed)

## 2020-03-24 NOTE — TOC Progression Note (Signed)
Transition of Care [facility]) - Progression Note    Patient Details  Name: Cynthia [name] MRN: [id number] Date of Birth: January 11, 1941  Transition of Care [facility]) CM/SW [place], [place] Phone Number: [phone] 03/24/2020, 10:13 AM  Clinical Narrative:     CSW messaged MD Horris Latino to follow up about potential discharge today.MD informed CSW that it will most likely be tomorrow however would update CSW.  TOC team will continue to assist with discharge planning needs.   Expected Discharge Plan: [place] Barriers to Discharge: Continued Medical Work up  Expected Discharge Plan and Services Expected Discharge Plan: [place] Choice: [place] arrangements for the past 2 months: Single Family Home                                       Social Determinants of Health (SDOH) Interventions    Readmission Risk Interventions No flowsheet data found.

## 2020-03-24 NOTE — Progress Notes (Signed)
Pt refusing CPAP for the night.  

## 2020-03-24 NOTE — Consult Note (Signed)
Neurosurgery Consultation  Reason for Consult: NPH, confusion Referring Physician: Horris Latino  CC: Confusion  HPI: This is a 79 y.o. woman that presents with worsening confusion in the setting of recent foot surgery. She was previously worked up by Dr. Felecia Shelling for NPH due to gait issues and responded favorably to high volume LP. She was admitted to [facility] and subsequently transferred here for acute confusion. Today she feels like she is back at baseline, she recalls being very confused and feels like she was "out of her mind". During my discussion, she is quite sharp. I noticed her retrosig craniectomy on her [facility] and she quickly told me she had a retrosigmoid craniotomy for a vestibular schwannoma with Dr. Erin Sons. She did not have post-op hydrocephalus, facial weakness, and retained functional hearing in the left ear. As of today, aside from some post-op pain in her feet, she has no complaints.    ROS: A 14 point ROS was performed and is negative except as noted in the HPI.   PMHx:  Past Medical History:  Diagnosis Date   Acute blood loss anemia 10/27/2017   Acute GI bleeding 10/27/2017   Anemia    Arthritis    "joints" (11/10/2017)   Dizziness    GERD (gastroesophageal reflux disease)    GI bleed 11/06/2017   Hearing loss    History of blood transfusion 10/2017   Hyperlipidemia    Hyperthyroidism    Melanoma ([place])    "cut off my back"   Memory loss    Sinus headache    Sleep apnea    FamHx:  Family History  Problem Relation Age of Onset   Congestive Heart Failure Mother    Heart disease Father        History of heart attacks at a later age.     Heart disease Brother    Hearing loss Brother    SocHx:  reports that she quit smoking about 41 years ago. Her smoking use included cigarettes. She quit after 2.00 years of use. She has never used smokeless tobacco. She reports current alcohol use. She reports that she does not use drugs.  Exam: Vital signs in last 24  hours: Temp:  [97.7 F (36.5 C)-99 F (37.2 C)] 99 F (37.2 C) (12/12 1512) Pulse Rate:  [73-94] 78 (12/12 1512) Resp:  [14-22] 17 (12/12 1512) BP: (159-169)/(88-98) 164/98 (12/12 1512) SpO2:  [91 %-97 %] 95 % (12/12 1512) Weight:  [73.5 kg] 73.5 kg (12/12 8366) General: Awake, alert, cooperative, lying in bed in NAD Head: Normocephalic and atruamatic HEENT: Neck supple Pulmonary: breathing room air comfortably, no evidence of increased work of breathing Cardiac: tachycardic Abdomen: S NT ND Extremities: +BLE in foot wraps with orthotic boots / sandals on Neuro: AOx3, PERRL, EOMI, FS & SS Strength 5/5 x4, SILTx4   Assessment and Plan: 79 y.o. woman with h/o possible NPH with acute confusion. [facility] personally reviewed, which shows left retrosig post-op changes, ventriculomegaly without transependymal flow or cisternal effacement.   -no acute neurosurgical intervention indicated at this time, acute worsening of AMS and spontaneous improvement are inconsistent with NPH, likely secondary process as already suggested by Dr. Horris Latino -Pt should follow up with me after discharge in 4-6 weeks to see how her gait is doing after her feet have healed some and evaluate further -please call with any concerns or questions  Judith Part, MD 03/24/20 3:57 PM [facility]

## 2020-03-24 NOTE — Progress Notes (Signed)
PHARMACY - PHYSICIAN COMMUNICATION CRITICAL VALUE ALERT - BLOOD CULTURE IDENTIFICATION (BCID)  Cynthia [name] is an 79 y.o. female who presented to [facility] on 03/20/2020 as a code stroke due to a chief complaint of slurred speech and significant confusion after taking oxycodone.  Assessment: 79 yo female presents as a code stroke s/p recent foot surgery d/t exostosis of both feet. Chest CT on 12/11 showed small patchy/nodular opacities in the left upper lung, new from 2 days prior. The patient was started on ceftriaxone and azithromycin for concern of CAP. BCID alert for gram positive rods in 1/4 aerobic bottles, which are likely contaminants. I would recommend continuing current antibiotic therapy for the treatment of pneumonia.   The patient is currently taking other oral medications, therefore, could switch to oral antibiotics. Per Dr. Horris Latino, the patient will likely discharge 12/13 and the provider would like to continue IV antibiotics up until discharge.   Name of physician (or Provider) Contacted: Dr. Horris Latino  Current antibiotics: Ceftriaxone 1 g q24h and azithromycin IV 500 mg q24h  Changes to prescribed antibiotics recommended: None, continue current therapy  - Recommendations accepted by provider  No results found for this or any previous visit.  Shauna Hugh, PharmD, [place]  PGY-1 Pharmacy Resident 03/24/2020 11:36 AM  Please check [email or web address] for unit-specific pharmacy phone numbers.

## 2020-03-24 NOTE — Plan of Care (Signed)

## 2020-03-25 LAB — CBC WITH DIFFERENTIAL/PLATELET
Abs Immature Granulocytes: 0.05 10*3/uL (ref 0.00–0.07)
Basophils Absolute: 0.1 10*3/uL (ref 0.0–0.1)
Basophils Relative: 1 %
Eosinophils Absolute: 0.3 10*3/uL (ref 0.0–0.5)
Eosinophils Relative: 3 %
HCT: 34.8 % — ABNORMAL LOW (ref 36.0–46.0)
Hemoglobin: 12.6 g/dL (ref 12.0–15.0)
Immature Granulocytes: 1 %
Lymphocytes Relative: 22 %
Lymphs Abs: 1.8 10*3/uL (ref 0.7–4.0)
MCH: 31.8 pg (ref 26.0–34.0)
MCHC: 36.2 g/dL — ABNORMAL HIGH (ref 30.0–36.0)
MCV: 87.9 fL (ref 80.0–100.0)
Monocytes Absolute: 1 10*3/uL (ref 0.1–1.0)
Monocytes Relative: 12 %
Neutro Abs: 5 10*3/uL (ref 1.7–7.7)
Neutrophils Relative %: 61 %
Platelets: 245 10*3/uL (ref 150–400)
RBC: 3.96 MIL/uL (ref 3.87–5.11)
RDW: 12.8 % (ref 11.5–15.5)
WBC: 8.2 10*3/uL (ref 4.0–10.5)
nRBC: 0 % (ref 0.0–0.2)

## 2020-03-25 LAB — CULTURE, BLOOD (ROUTINE X 2): Special Requests: ADEQUATE

## 2020-03-25 LAB — SARS CORONAVIRUS 2 BY RT PCR (HOSPITAL ORDER, PERFORMED IN ~~LOC~~ HOSPITAL LAB): SARS Coronavirus 2: NEGATIVE

## 2020-03-25 LAB — BASIC METABOLIC PANEL
Anion gap: 12 (ref 5–15)
Anion gap: 12 (ref 5–15)
BUN: 18 mg/dL (ref 8–23)
BUN: 16 mg/dL (ref 8–23)
CO2: 23 mmol/L (ref 22–32)
CO2: 24 mmol/L (ref 22–32)
Calcium: 9.3 mg/dL (ref 8.9–10.3)
Calcium: 9.1 mg/dL (ref 8.9–10.3)
Chloride: 92 mmol/L — ABNORMAL LOW (ref 98–111)
Chloride: 93 mmol/L — ABNORMAL LOW (ref 98–111)
Creatinine, Ser: 0.62 mg/dL (ref 0.44–1.00)
Creatinine, Ser: 0.68 mg/dL (ref 0.44–1.00)
GFR, Estimated: >60 mL/min (ref >60)
GFR, Estimated: >60 mL/min (ref >60)
Glucose, Bld: 102 mg/dL — ABNORMAL HIGH (ref 70–99)
Glucose, Bld: 128 mg/dL — ABNORMAL HIGH (ref 70–99)
Potassium: 3.4 mmol/L — ABNORMAL LOW (ref 3.5–5.1)
Potassium: 3.3 mmol/L — ABNORMAL LOW (ref 3.5–5.1)
Sodium: 127 mmol/L — ABNORMAL LOW (ref 135–145)
Sodium: 129 mmol/L — ABNORMAL LOW (ref 135–145)

## 2020-03-25 LAB — OSMOLALITY: Osmolality: 270 mOsm/kg — ABNORMAL LOW (ref 275–295)

## 2020-03-25 LAB — URINE CULTURE: Culture: <10000 — AB

## 2020-03-25 LAB — OSMOLALITY, URINE: Osmolality, Ur: 480 mOsm/kg (ref 300–900)

## 2020-03-25 LAB — SODIUM, URINE, RANDOM: Sodium, Ur: 108 mmol/L

## 2020-03-25 MED ORDER — HYDRALAZINE HCL 25 MG PO TABS
25.0000 mg | ORAL_TABLET | Freq: Once | ORAL | Status: AC
  Administered 2020-03-25: 25 mg via ORAL
  Filled 2020-03-25: qty 1

## 2020-03-25 MED ORDER — QUETIAPINE FUMARATE 25 MG PO TABS
25.0000 mg | ORAL_TABLET | Freq: Every day | ORAL | Status: DC
  Administered 2020-03-25: 25 mg via ORAL
  Filled 2020-03-25: qty 1

## 2020-03-25 MED ORDER — POTASSIUM CHLORIDE CRYS ER 20 MEQ PO TBCR
40.0000 meq | EXTENDED_RELEASE_TABLET | Freq: Once | ORAL | Status: AC
  Administered 2020-03-25: 40 meq via ORAL
  Filled 2020-03-25: qty 2

## 2020-03-25 MED ORDER — HYDRALAZINE HCL 20 MG/ML IJ SOLN
5.0000 mg | INTRAMUSCULAR | Status: DC | PRN
  Administered 2020-03-25: 5 mg via INTRAVENOUS
  Filled 2020-03-25: qty 1

## 2020-03-25 NOTE — Plan of Care (Signed)
  Problem: Education: Goal: Knowledge of General Education information will improve Description: Including pain rating scale, medication(s)/side effects and non-pharmacologic comfort measures Outcome: Progressing   Problem: Clinical Measurements: Goal: Diagnostic test results will improve Outcome: Progressing Goal: Respiratory complications will improve Outcome: Progressing Goal: Cardiovascular complication will be avoided Outcome: Progressing   Problem: Elimination: Goal: Will not experience complications related to bowel motility Outcome: Progressing   Problem: Safety: Goal: Ability to remain free from injury will improve Outcome: Progressing

## 2020-03-25 NOTE — Progress Notes (Signed)
Physical Therapy Treatment Patient Details Name: Cynthia [name] MRN: [id number] DOB: 06/23/1940 Today's Date: 03/25/2020    History of Present Illness Pt is a 79 y/o female admitted secondary to AMS and inability to ambulate. Pt with recent bilateral foot callous removal. PMH includes a fib.     PT Comments    Pt stated she needed to have BM and wanted to try [facility], pt unable to complete transfer prior to having the BM. Pt requiring increased assistance for sit<>stand due to increased posterior lean. Increased assist needed to maintain standing due to increased posterior lean. Pt asked for therapy to be done following hygiene due to fatigue. Pt demonstrating deficits in balance, strength, coordination, endurance, safety and gait. Pt will benefit from skilled PT to address deficits to maximize independence with functional mobility prior to discharge.     Follow Up Recommendations  SNF;Supervision/Assistance - 24 hour     Equipment Recommendations  Wheelchair cushion (measurements PT);Wheelchair (measurements PT);Other (comment)    Recommendations for Other Services       Precautions / Restrictions      Mobility  Bed Mobility Overal bed mobility: Needs Assistance Bed Mobility: Supine to Sit;Sit to Supine     Supine to sit: Min guard;HOB elevated Sit to supine: Min guard;HOB elevated      Transfers Overall transfer level: Needs assistance Equipment used: None Transfers: Sit to/from Stand Sit to Stand: Max assist;Mod assist         General transfer comment: attempted transfer from [facility] to [facility], pt unable to coordinate with Rw or with HHA, increased posterior lean with inability to coordinate movement of feet. Performed sit<>stand 5 times for hygiene wtih therapist providing mod-max A for transfer and to maintain balance in standing with 1 UE support on bed  Ambulation/Gait                 Stairs             Wheelchair Mobility    Modified Rankin (Stroke  Patients Only)       Balance                                            Cognition                                              Exercises      General Comments        Pertinent Vitals/Pain      Home Living                      Prior Function            PT Goals (current goals can now be found in the care plan section) Acute Rehab PT Goals Patient Stated Goal: I need to move better. PT Goal Formulation: With patient Time For Goal Achievement: 04/03/20 Potential to Achieve Goals: Good Progress towards PT goals: Progressing toward goals    Frequency    Min 2X/week      PT Plan Current plan remains appropriate    [name]-evaluation              AM-PAC PT "6 Clicks" Mobility   Outcome Measure  Help needed turning from  your back to your side while in a flat bed without using bedrails?: A Little Help needed moving from lying on your back to sitting on the side of a flat bed without using bedrails?: A Lot Help needed moving to and from a bed to a chair (including a wheelchair)?: Total Help needed standing up from a chair using your arms (e.g., wheelchair or bedside chair)?: A Lot Help needed to walk in hospital room?: Total Help needed climbing 3-5 steps with a railing? : Total 6 Click Score: 10    End of Session Equipment Utilized During Treatment: Gait belt Activity Tolerance: Patient limited by lethargy Patient left: in bed;with call bell/phone within reach Nurse Communication: Mobility status PT Visit Diagnosis: Other abnormalities of gait and mobility (R26.89);Difficulty in walking, not elsewhere classified (R26.2);Pain;Unsteadiness on feet (R26.81);Muscle weakness (generalized) (M62.81)     Time: 8891-6945 PT Time Calculation (min) (ACUTE ONLY): 14 min  Charges:  $Therapeutic Activity: 8-22 mins                     Cynthia [name], DPT Acute Rehabilitation Services [phone]   Cynthia [name] 03/25/2020, 1:48 PM

## 2020-03-25 NOTE — Progress Notes (Signed)
RT NOTE:  Patient refuses CPAP tonight.

## 2020-03-25 NOTE — Plan of Care (Signed)
  Problem: Education: Goal: Knowledge of General Education information will improve Description Including pain rating scale, medication(s)/side effects and non-pharmacologic comfort measures Outcome: Progressing   

## 2020-03-25 NOTE — TOC Progression Note (Signed)
Transition of Care [facility]) - Progression Note    Patient Details  Name: Cynthia [name] MRN: [id number] Date of Birth: 05/26/1940  [facility] CM/SW [place], [place] Phone Number: 03/25/2020, 3:26 PM  Clinical Narrative:     CSW submitted updated clinicals to patients insurance for review. Olivia Mackie with [facility] confirmed that they can accept patient tomorrow for SNF placement if medically ready.  Patient has SNF bed at [facility]. Insurance authorization has been approved.  CSW will continue to follow.    Expected Discharge Plan: [place] Barriers to Discharge: Continued Medical Work up  Expected Discharge Plan and Services Expected Discharge Plan: [place] Choice: [place] arrangements for the past 2 months: Single Family Home                                       Social Determinants of Health (SDOH) Interventions    Readmission Risk Interventions No flowsheet data found.

## 2020-03-25 NOTE — Progress Notes (Signed)
PROGRESS NOTE  Cynthia [name] DVV:[id number] DOB: [birth year] DOA: 03/20/2020 PCP: Deland Pretty, MD  HPI/Recap of past 24 hours: HPI from Dr Merleen Milliner Cynthia [name] is a 79 y.o. female with PMH sig for PAF, exostosis of both feet status post surgery by podiatry on 03/18/2020, HL, GERD, OSA on CPAP, mild dementia and dizziness with gait abnormality presently being worked up for NPH was in her usual state of health until was noted to be confused by her family. Pt c/o significant pain in her feet with surgery, was given oxycodone and then 2 hours later was found sitting on the side of the bed with slurred speech and significant confusion.  Patient was called in as a code stroke to the ED. Patient herself states she does remember what happened.  Notes "I was slipping and sliding on the side of the bed". Patient thinks that she has been able to get up and go to the bathroom at home since her surgery.  Daughter however is requesting placement in rehab as they are having difficulty managing her since her surgery and her difficulty with gait and pain. The daughter believes that patient forgets that she has had surgery and tries to get out of bed and immediately falls due to the pain which she does not remember that she has. In the ED, neurology felt this was more likely toxic metabolic encephalopathy most likely secondary to oxycodone and not using her CPAP overnight.  Emergent head CT was negative for any acute intracranial process.  Patient was however noted to be in RVR with heart rate of 150 in the ED, difficult to control, so pt was admitted. Of note patient has been off her Eliquis due to her recent foot surgery.    Today, patient denies any new complaints, as per daughter, spoke to patient, noted some intermittent confusion.  Sodium levels noted to have dropped    Assessment/Plan: Principal Problem:   Atrial fibrillation with RVR (HCC) Active Problems:   OSA on CPAP   Essential hypertension,  benign   GERD (gastroesophageal reflux disease)   History of recurrent UTIs   Memory loss   Postural dizziness with presyncope   Gait disturbance   Paroxysmal A-fib (HCC)   Paroxysmal A. fib with RVR Rate controlled currently off diltiazem drip Continue p.o. Cardizem, Eliquis TSH, free T4 WNL  Acute metabolic encephalopathy possibly 2/2 CAP Vs narcotic induced Reported cough, sat well on RA Improved mentation Hx of Memory loss, ?NPH UA neg, UC < 10,000 growth BC 1 out of 4 grew gram-positive rods, likely contaminant, repeat NGTD RPR negative, Vit B12 WNL CXR showed small patchy/nodular opacities in the left upper lung zone  CT head on admission, unremarkable, no evidence of acute intracranial abnormality, noted ventriculomegaly Reconsulted neurology, recommend neurosurgery consult Discussed with neurosurgery Dr. Venetia Constable on 03/23/2020, does not think acute encephalopathy is due to/related to NPH, and placement of a shunt is not indicated Continue IV ceftriaxone, azithromycin Monitor closely  Hyponatremia Worsening Hold patient's DDAVP/desmopressin as it can cause hyponatremia Daily BMP  Difficulty with ambulation status post bilateral foot surgery Patient is s/p bilateral removal of exostosis on 03/18/20 PT/OT consulted-SNF placement Fall precautions  Hypokalemia Replace as needed  Hypothyroidism Continue Synthroid  GERD PPI  OSA Continue CPAP  History of possible ?NPH Memory loss Delirium precautions Continue to follow-up with outpatient neurologist Ativan as needed, start Seroquel prn at bedtime       Malnutrition Type:      Malnutrition  Characteristics:      Nutrition Interventions:       Estimated body mass index is 28.12 kg/m as calculated from the following:   Height as of this encounter: 5\' 5"  (1.651 m).   Weight as of this encounter: 76.7 kg.      Code Status: Full  Family Communication: Discussed with daughter and spouse on  03/25/2020  Disposition Plan: Status is: Inpatient  The patient will require care spanning > 2 midnights and should be moved to inpatient because: Inpatient level of care appropriate due to severity of illness  Dispo: The patient is from: Home              Anticipated d/c is to: SNF              Anticipated d/c date is: 1 day              Patient currently is not medically stable to d/c.    Consultants:  Neurology  Neurosurgery  Procedures:  None  Antimicrobials:  Ceftriaxone  Azithromycin  DVT prophylaxis: Eliquis   Objective: Vitals:   03/24/20 1512 03/24/20 2104 03/25/20 0519 03/25/20 1441  BP: (!) 164/98 (!) 142/66 (!) 175/91 (!) 170/87  Pulse: 78 76 74 81  Resp: 17 17 16 19   Temp: 99 F (37.2 C) 98.9 F (37.2 C) 98.6 F (37 C) 97.9 F (36.6 C)  TempSrc: Oral Oral Oral Oral  SpO2: 95% 95% 95% 93%  Weight:   76.7 kg   Height:        Intake/Output Summary (Last 24 hours) at 03/25/2020 1510 Last data filed at 03/25/2020 1030 Gross per 24 hour  Intake 945.42 ml  Output 250 ml  Net 695.42 ml   Filed Weights   03/20/20 0917 03/24/20 0632 03/25/20 0519  Weight: 65.8 kg 73.5 kg 76.7 kg    Exam:  General: NAD, oriented  Cardiovascular: S1, S2 present  Respiratory: CTAB  Abdomen: Soft, nontender, nondistended, bowel sounds present  Musculoskeletal: No bilateral pedal edema noted, bilateral foot placed in boots  Skin: Normal  Psychiatry:  Normal mood   Data Reviewed: CBC: Recent Labs  Lab 03/20/20 0849 03/20/20 0850 03/21/20 0222 03/22/20 0057 03/23/20 0218 03/24/20 0541 03/25/20 0639  WBC 9.9  --  8.7 8.4 9.7 8.8 8.2  NEUTROABS 6.9  --   --  5.9 7.1 5.7 5.0  HGB 11.7*   < > 11.1* 11.8* 12.0 12.4 12.6  HCT 36.7   < > 32.7* 35.6* 35.7* 35.8* 34.8*  MCV 95.1  --  92.4 91.5 91.1 89.9 87.9  PLT 203  --  215 208 228 278 245   < > = values in this interval not displayed.   Basic Metabolic Panel: Recent Labs  Lab 03/21/20 0222  03/22/20 0554 03/23/20 0218 03/24/20 0541 03/25/20 0639 03/25/20 1308  NA 138 132* 134* 130* 127* 129*  K 3.3* 4.0 3.4* 3.7 3.4* 3.3*  CL 102 99 100 97* 92* 93*  CO2 25 24 23 22 23 24   GLUCOSE 114* 159* 113* 103* 102* 128*  BUN 18 12 11 21 18 16   CREATININE 0.68 0.57 0.53 0.64 0.62 0.68  CALCIUM 9.2 8.8* 9.1 9.6 9.3 9.1  MG 1.9  --   --   --   --   --    GFR: Estimated Creatinine Clearance: 60.3 mL/min (by C-G formula based on SCr of 0.68 mg/dL). Liver Function Tests: Recent Labs  Lab 03/20/20 0849  AST 26  ALT  18  ALKPHOS 41  BILITOT 0.8  PROT 6.0*  ALBUMIN 3.3*   No results for input(s): LIPASE, AMYLASE in the last 168 hours. Recent Labs  Lab 03/23/20 2039  AMMONIA 24   Coagulation Profile: Recent Labs  Lab 03/20/20 0849  INR 1.0   Cardiac Enzymes: No results for input(s): CKTOTAL, CKMB, CKMBINDEX, TROPONINI in the last 168 hours. BNP (last 3 results) No results for input(s): PROBNP in the last 8760 hours. HbA1C: No results for input(s): HGBA1C in the last 72 hours. CBG: Recent Labs  Lab 03/20/20 0844  GLUCAP 109*   Lipid Profile: No results for input(s): CHOL, HDL, LDLCALC, TRIG, CHOLHDL, LDLDIRECT in the last 72 hours. Thyroid Function Tests: No results for input(s): TSH, T4TOTAL, FREET4, T3FREE, THYROIDAB in the last 72 hours. Anemia Panel: Recent Labs    03/24/20 0541  VITAMINB12 2,904*   Urine analysis:    Component Value Date/Time   COLORURINE AMBER (A) 03/23/2020 2236   APPEARANCEUR CLEAR 03/23/2020 2236   LABSPEC 1.015 03/23/2020 2236   PHURINE 7.0 03/23/2020 2236   GLUCOSEU NEGATIVE 03/23/2020 2236   HGBUR NEGATIVE 03/23/2020 2236   BILIRUBINUR NEGATIVE 03/23/2020 2236   KETONESUR 80 (A) 03/23/2020 2236   PROTEINUR 100 (A) 03/23/2020 2236   NITRITE NEGATIVE 03/23/2020 2236   LEUKOCYTESUR NEGATIVE 03/23/2020 2236   Sepsis Labs: @LABRCNTIP (procalcitonin:4,lacticidven:4)  ) Recent Results (from the past 240 hour(s))  Resp  Panel by RT-PCR (Flu A&B, Covid) Nasopharyngeal Swab     Status: None   Collection Time: 03/20/20  9:17 AM   Specimen: Nasopharyngeal Swab; Nasopharyngeal(NP) swabs in vial transport medium  Result Value Ref Range Status   SARS Coronavirus 2 by RT PCR NEGATIVE NEGATIVE Final    Comment: (NOTE) SARS-CoV-2 target nucleic acids are NOT DETECTED.  The SARS-CoV-2 RNA is generally detectable in upper respiratory specimens during the acute phase of infection. The lowest concentration of SARS-CoV-2 viral copies this assay can detect is 138 copies/mL. A negative result does not preclude SARS-Cov-2 infection and should not be used as the sole basis for treatment or other patient management decisions. A negative result may occur with  improper specimen collection/handling, submission of specimen other than nasopharyngeal swab, presence of viral mutation(s) within the areas targeted by this assay, and inadequate number of viral copies(<138 copies/mL). A negative result must be combined with clinical observations, patient history, and epidemiological information. The expected result is Negative.  Fact Sheet for Patients:  EntrepreneurPulse.com.au  Fact Sheet for Healthcare Providers:  IncredibleEmployment.be  This test is no t yet approved or cleared by the [place] FDA and  has been authorized for detection and/or diagnosis of SARS-CoV-2 by FDA under an Emergency Use Authorization (EUA). This EUA will remain  in effect (meaning this test can be used) for the duration of the COVID-19 declaration under Section 564(b)(1) of the Act, 21 U.S.C.section 360bbb-3(b)(1), unless the authorization is terminated  or revoked sooner.       Influenza A by PCR NEGATIVE NEGATIVE Final   Influenza B by PCR NEGATIVE NEGATIVE Final    Comment: (NOTE) The Xpert Xpress SARS-CoV-2/FLU/RSV plus assay is intended as an aid in the diagnosis of influenza from Nasopharyngeal  swab specimens and should not be used as a sole basis for treatment. Nasal washings and aspirates are unacceptable for Xpert Xpress SARS-CoV-2/FLU/RSV testing.  Fact Sheet for Patients: EntrepreneurPulse.com.au  Fact Sheet for Healthcare Providers: IncredibleEmployment.be  This test is not yet approved or cleared by the [place] and  has been authorized for detection and/or diagnosis of SARS-CoV-2 by FDA under an Emergency Use Authorization (EUA). This EUA will remain in effect (meaning this test can be used) for the duration of the COVID-19 declaration under Section 564(b)(1) of the Act, 21 U.S.C. section 360bbb-3(b)(1), unless the authorization is terminated or revoked.  Performed at [facility], [place] [address] [place], [place] [address]   SARS Coronavirus 2 by RT PCR (hospital order, performed in [facility] hospital lab) Nasopharyngeal Nasopharyngeal Swab     Status: None   Collection Time: 03/21/20  4:55 PM   Specimen: Nasopharyngeal Swab  Result Value Ref Range Status   SARS Coronavirus 2 NEGATIVE NEGATIVE Final    Comment: (NOTE) SARS-CoV-2 target nucleic acids are NOT DETECTED.  The SARS-CoV-2 RNA is generally detectable in upper and lower respiratory specimens during the acute phase of infection. The lowest concentration of SARS-CoV-2 viral copies this assay can detect is 250 copies / mL. A negative result does not preclude SARS-CoV-2 infection and should not be used as the sole basis for treatment or other patient management decisions.  A negative result may occur with improper specimen collection / handling, submission of specimen other than nasopharyngeal swab, presence of viral mutation(s) within the areas targeted by this assay, and inadequate number of viral copies (<250 copies / mL). A negative result must be combined with clinical observations, patient history, and epidemiological information.  Fact Sheet  for Patients:   StrictlyIdeas.no  Fact Sheet for Healthcare Providers: BankingDealers.co.za  This test is not yet approved or  cleared by the [place] FDA and has been authorized for detection and/or diagnosis of SARS-CoV-2 by FDA under an Emergency Use Authorization (EUA).  This EUA will remain in effect (meaning this test can be used) for the duration of the COVID-19 declaration under Section 564(b)(1) of the Act, 21 U.S.C. section 360bbb-3(b)(1), unless the authorization is terminated or revoked sooner.  Performed at [facility], [place] [address], [place], [place] [address]   Culture, blood (routine x 2)     Status: Abnormal   Collection Time: 03/23/20  8:14 PM   Specimen: BLOOD  Result Value Ref Range Status   Specimen Description BLOOD RIGHT ANTECUBITAL  Final   Special Requests   Final    BOTTLES DRAWN AEROBIC AND ANAEROBIC Blood Culture adequate volume   Culture  Setup Time   Final    GRAM POSITIVE RODS AEROBIC BOTTLE ONLY CRITICAL RESULT CALLED TO, READ BACK BY AND VERIFIED WITH: PHARMD CATHY P. [phone] FCP     Culture (A)  Final    BACILLUS SPECIES Standardized susceptibility testing for this organism is not available. Performed at [facility], [place] [address] [place], [place] [address]    Report Status 03/25/2020 FINAL  Final  Culture, blood (routine x 2)     Status: None (Preliminary result)   Collection Time: 03/23/20  8:14 PM   Specimen: BLOOD  Result Value Ref Range Status   Specimen Description BLOOD SITE NOT SPECIFIED  Final   Special Requests   Final    BOTTLES DRAWN AEROBIC AND ANAEROBIC Blood Culture results may not be optimal due to an excessive volume of blood received in culture bottles   Culture   Final    NO GROWTH 2 DAYS Performed at [facility], [place] [address], [place], [place] [address]    Report Status PENDING  Incomplete  Culture, Urine     Status: Abnormal  Collection Time: 03/23/20 10:18 PM   Specimen: Urine, Clean Catch  Result Value Ref Range Status   Specimen Description URINE, CLEAN CATCH  Final   Special Requests NONE  Final   Culture (A)  Final    <10,000 COLONIES/mL INSIGNIFICANT GROWTH Performed at [facility], [address], [place], [place] [address]    Report Status 03/25/2020 FINAL  Final  Culture, blood (routine x 2)     Status: None (Preliminary result)   Collection Time: 03/24/20  3:24 PM   Specimen: BLOOD  Result Value Ref Range Status   Specimen Description BLOOD BLOOD RIGHT HAND  Final   Special Requests   Final    BOTTLES DRAWN AEROBIC ONLY Blood Culture adequate volume   Culture   Final    NO GROWTH < 24 HOURS Performed at [facility], [place] [address], [place], [place] [address]    Report Status PENDING  Incomplete  Culture, blood (routine x 2)     Status: None (Preliminary result)   Collection Time: 03/24/20  3:24 PM   Specimen: BLOOD  Result Value Ref Range Status   Specimen Description BLOOD RIGHT ANTECUBITAL  Final   Special Requests   Final    BOTTLES DRAWN AEROBIC ONLY Blood Culture adequate volume   Culture   Final    NO GROWTH < 24 HOURS Performed at [facility], [place] [address] [place], [place] [address]    Report Status PENDING  Incomplete  SARS Coronavirus 2 by RT PCR (hospital order, performed in [facility] hospital lab) Nasopharyngeal Nasopharyngeal Swab     Status: None   Collection Time: 03/25/20  9:47 AM   Specimen: Nasopharyngeal Swab  Result Value Ref Range Status   SARS Coronavirus 2 NEGATIVE NEGATIVE Final    Comment: (NOTE) SARS-CoV-2 target nucleic acids are NOT DETECTED.  The SARS-CoV-2 RNA is generally detectable in upper and lower respiratory specimens during the acute phase of infection. The lowest concentration of SARS-CoV-2 viral copies this assay can detect is 250 copies / mL. A negative result does not preclude SARS-CoV-2 infection and should not be  used as the sole basis for treatment or other patient management decisions.  A negative result may occur with improper specimen collection / handling, submission of specimen other than nasopharyngeal swab, presence of viral mutation(s) within the areas targeted by this assay, and inadequate number of viral copies (<250 copies / mL). A negative result must be combined with clinical observations, patient history, and epidemiological information.  Fact Sheet for Patients:   StrictlyIdeas.no  Fact Sheet for Healthcare Providers: BankingDealers.co.za  This test is not yet approved or  cleared by the [place] FDA and has been authorized for detection and/or diagnosis of SARS-CoV-2 by FDA under an Emergency Use Authorization (EUA).  This EUA will remain in effect (meaning this test can be used) for the duration of the COVID-19 declaration under Section 564(b)(1) of the Act, 21 U.S.C. section 360bbb-3(b)(1), unless the authorization is terminated or revoked sooner.  Performed at [facility], [place] [address], [place], [place] [address]       Studies: No results found.  Scheduled Meds: . acetaminophen  500 mg Oral BID  . apixaban  5 mg Oral BID  . atorvastatin  10 mg Oral Daily  . calcium-vitamin D  1 tablet Oral BID  . cholecalciferol  1,000 Units Oral Daily  . cycloSPORINE  1 drop Both Eyes BID  . diltiazem  120 mg Oral Daily  . DULoxetine  30 mg Oral BID  . ibuprofen  400 mg Oral BID  . melatonin  6 mg Oral QHS  . nitrofurantoin  100 mg Oral Daily  . pantoprazole  40 mg Oral Daily  . sodium chloride flush  3 mL Intravenous Once  . sodium chloride flush  3 mL Intravenous Q12H  . thyroid  60 mg Oral QAC breakfast    Continuous Infusions: . sodium chloride    . azithromycin 500 mg (03/25/20 1013)  . cefTRIAXone (ROCEPHIN)  IV Stopped (03/25/20 1008)     LOS: 1 day     Alma Friendly, MD [facility]  If 7PM-7AM, please contact night-coverage [email or web address] 03/25/2020, 3:10 PM

## 2020-03-26 ENCOUNTER — Inpatient Hospital Stay (HOSPITAL_COMMUNITY): Payer: Medicare PPO

## 2020-03-26 DIAGNOSIS — M2550 Pain in unspecified joint: Secondary | ICD-10-CM | POA: Diagnosis not present

## 2020-03-26 DIAGNOSIS — Z7401 Bed confinement status: Secondary | ICD-10-CM | POA: Diagnosis not present

## 2020-03-26 DIAGNOSIS — G4489 Other headache syndrome: Secondary | ICD-10-CM | POA: Diagnosis not present

## 2020-03-26 DIAGNOSIS — R413 Other amnesia: Secondary | ICD-10-CM | POA: Diagnosis not present

## 2020-03-26 DIAGNOSIS — M549 Dorsalgia, unspecified: Secondary | ICD-10-CM | POA: Diagnosis not present

## 2020-03-26 DIAGNOSIS — I4891 Unspecified atrial fibrillation: Secondary | ICD-10-CM | POA: Diagnosis not present

## 2020-03-26 DIAGNOSIS — N390 Urinary tract infection, site not specified: Secondary | ICD-10-CM | POA: Diagnosis not present

## 2020-03-26 DIAGNOSIS — R300 Dysuria: Secondary | ICD-10-CM | POA: Diagnosis not present

## 2020-03-26 DIAGNOSIS — R4020 Unspecified coma: Secondary | ICD-10-CM | POA: Diagnosis not present

## 2020-03-26 DIAGNOSIS — F0280 Dementia in other diseases classified elsewhere without behavioral disturbance: Secondary | ICD-10-CM | POA: Diagnosis not present

## 2020-03-26 DIAGNOSIS — G9341 Metabolic encephalopathy: Secondary | ICD-10-CM | POA: Diagnosis not present

## 2020-03-26 DIAGNOSIS — R52 Pain, unspecified: Secondary | ICD-10-CM | POA: Diagnosis not present

## 2020-03-26 DIAGNOSIS — E871 Hypo-osmolality and hyponatremia: Secondary | ICD-10-CM

## 2020-03-26 DIAGNOSIS — Z9989 Dependence on other enabling machines and devices: Secondary | ICD-10-CM | POA: Diagnosis not present

## 2020-03-26 DIAGNOSIS — R531 Weakness: Secondary | ICD-10-CM | POA: Diagnosis not present

## 2020-03-26 DIAGNOSIS — Z87891 Personal history of nicotine dependence: Secondary | ICD-10-CM | POA: Diagnosis not present

## 2020-03-26 DIAGNOSIS — Z87440 Personal history of urinary (tract) infections: Secondary | ICD-10-CM | POA: Diagnosis not present

## 2020-03-26 DIAGNOSIS — G4733 Obstructive sleep apnea (adult) (pediatric): Secondary | ICD-10-CM | POA: Diagnosis not present

## 2020-03-26 DIAGNOSIS — I480 Paroxysmal atrial fibrillation: Secondary | ICD-10-CM | POA: Diagnosis not present

## 2020-03-26 DIAGNOSIS — I10 Essential (primary) hypertension: Secondary | ICD-10-CM | POA: Diagnosis not present

## 2020-03-26 DIAGNOSIS — R269 Unspecified abnormalities of gait and mobility: Secondary | ICD-10-CM | POA: Diagnosis not present

## 2020-03-26 DIAGNOSIS — R350 Frequency of micturition: Secondary | ICD-10-CM | POA: Diagnosis not present

## 2020-03-26 DIAGNOSIS — Z7901 Long term (current) use of anticoagulants: Secondary | ICD-10-CM | POA: Diagnosis not present

## 2020-03-26 DIAGNOSIS — K219 Gastro-esophageal reflux disease without esophagitis: Secondary | ICD-10-CM | POA: Diagnosis not present

## 2020-03-26 DIAGNOSIS — Z79899 Other long term (current) drug therapy: Secondary | ICD-10-CM | POA: Diagnosis not present

## 2020-03-26 DIAGNOSIS — G9340 Encephalopathy, unspecified: Secondary | ICD-10-CM

## 2020-03-26 DIAGNOSIS — G4761 Periodic limb movement disorder: Secondary | ICD-10-CM | POA: Diagnosis not present

## 2020-03-26 DIAGNOSIS — R0902 Hypoxemia: Secondary | ICD-10-CM | POA: Diagnosis not present

## 2020-03-26 DIAGNOSIS — M898X7 Other specified disorders of bone, ankle and foot: Secondary | ICD-10-CM | POA: Diagnosis not present

## 2020-03-26 DIAGNOSIS — G319 Degenerative disease of nervous system, unspecified: Secondary | ICD-10-CM | POA: Diagnosis not present

## 2020-03-26 DIAGNOSIS — Z4789 Encounter for other orthopedic aftercare: Secondary | ICD-10-CM | POA: Diagnosis not present

## 2020-03-26 DIAGNOSIS — R55 Syncope and collapse: Secondary | ICD-10-CM | POA: Diagnosis present

## 2020-03-26 DIAGNOSIS — I700 Atherosclerosis of aorta: Secondary | ICD-10-CM | POA: Diagnosis not present

## 2020-03-26 DIAGNOSIS — I499 Cardiac arrhythmia, unspecified: Secondary | ICD-10-CM | POA: Diagnosis not present

## 2020-03-26 DIAGNOSIS — E559 Vitamin D deficiency, unspecified: Secondary | ICD-10-CM | POA: Diagnosis not present

## 2020-03-26 LAB — BASIC METABOLIC PANEL
Anion gap: 13 (ref 5–15)
BUN: 11 mg/dL (ref 8–23)
CO2: 22 mmol/L (ref 22–32)
Calcium: 9.9 mg/dL (ref 8.9–10.3)
Chloride: 97 mmol/L — ABNORMAL LOW (ref 98–111)
Creatinine, Ser: 0.57 mg/dL (ref 0.44–1.00)
GFR, Estimated: >60 mL/min (ref >60)
Glucose, Bld: 114 mg/dL — ABNORMAL HIGH (ref 70–99)
Potassium: 3.4 mmol/L — ABNORMAL LOW (ref 3.5–5.1)
Sodium: 132 mmol/L — ABNORMAL LOW (ref 135–145)

## 2020-03-26 MED ORDER — ACETAMINOPHEN 500 MG PO TABS
500.0000 mg | ORAL_TABLET | Freq: Once | ORAL | Status: AC
  Administered 2020-03-26: 500 mg via ORAL
  Filled 2020-03-26: qty 1

## 2020-03-26 MED ORDER — IBUPROFEN 200 MG PO TABS: 400.0000 mg | ORAL_TABLET | Freq: Two times a day (BID) | ORAL | 0 refills | Status: DC | PRN

## 2020-03-26 MED ORDER — POTASSIUM CHLORIDE CRYS ER 20 MEQ PO TBCR
40.0000 meq | EXTENDED_RELEASE_TABLET | Freq: Once | ORAL | Status: AC
  Administered 2020-03-26: 40 meq via ORAL
  Filled 2020-03-26: qty 2

## 2020-03-26 MED ORDER — POLYETHYLENE GLYCOL 3350 17 G PO PACK: 17.0000 g | PACK | Freq: Every day | ORAL | 0 refills | Status: DC | PRN

## 2020-03-26 MED ORDER — QUETIAPINE FUMARATE 25 MG PO TABS: 25.0000 mg | ORAL_TABLET | Freq: Every day | ORAL | Status: DC

## 2020-03-26 MED ORDER — CEFDINIR 300 MG PO CAPS: 300.0000 mg | ORAL_CAPSULE | Freq: Two times a day (BID) | ORAL | 0 refills | Status: AC

## 2020-03-26 MED ORDER — LORAZEPAM 2 MG/ML IJ SOLN
0.5000 mg | Freq: Once | INTRAMUSCULAR | Status: AC | PRN
  Administered 2020-03-26: 0.5 mg via INTRAVENOUS
  Filled 2020-03-26: qty 1

## 2020-03-26 MED ORDER — HYDRALAZINE HCL 25 MG PO TABS
25.0000 mg | ORAL_TABLET | Freq: Three times a day (TID) | ORAL | Status: DC
  Administered 2020-03-26: 25 mg via ORAL
  Filled 2020-03-26: qty 1

## 2020-03-26 NOTE — Discharge Summary (Signed)
Discharge Summary  Cynthia [name] MPN:[id number] DOB: 07/31/1940  PCP: Deland Pretty, MD  Admit date: 03/20/2020 Discharge date: 03/26/2020  Time spent: 40 mins  Recommendations for Outpatient Follow-up:  1. Follow-up with PCP in 1 week 2. Follow-up with podiatry on 03/27/2020 at 1:15 PM 3. Follow-up with neurology as scheduled    Discharge Diagnoses:  Active Hospital Problems   Diagnosis Date Noted  . Atrial fibrillation with RVR ([place]) 03/20/2020  . Paroxysmal A-fib ([place]) 03/24/2020  . Gait disturbance 01/29/2020  . Postural dizziness with presyncope 03/17/2018  . Memory loss 11/25/2017  . GERD (gastroesophageal reflux disease) 11/23/2016  . History of recurrent UTIs 11/23/2016  . Essential hypertension, benign 06/25/2014  . OSA on CPAP 06/25/2014    Resolved Hospital Problems  No resolved problems to display.    Discharge Condition: Stable  Diet recommendation: Heart healthy  Vitals:   03/25/20 2150 03/26/20 0500  BP: (!) 197/97 (!) 149/94  Pulse:  85  Resp: 13 18  Temp:  98 F (36.7 C)  SpO2:  96%    History of present illness:  Cynthia [name] a 79 y.o.femalewith PMH sig for PAF,exostosis of both feet status post surgery by podiatry on 03/18/2020, HL, GERD, OSA on CPAP, mild dementiaand dizziness with gait abnormality presently being worked up for NPH was in her usual state of health until was noted to be confused by her family. Pt c/o significant pain in her feet with surgery, was given oxycodone and then 2 hours later was found sitting on the side of the bed with slurred speech and significant confusion. Patient was called in as a code stroke to the ED. Patient herself states she does remember what happened. Notes "I was slipping and sliding on the side of the bed". Patient thinks that she has been able to get up and go to the bathroom at home since her surgery. Daughter however is requesting placement in rehab as they are having difficulty managing her  since her surgery and her difficulty with gait and pain.The daughter believes that patient forgets that she has had surgery and tries to get out of bed and immediately falls due to the pain which she does not remember that she has. In the ED, neurology felt this was more likely toxic metabolic encephalopathy most likely secondary to oxycodone and not using her CPAP overnight. Emergent head CT was negative for any acute intracranial process. Patient was however noted to be in RVR with heart rate of 150 in the ED, difficult to control, so pt was admitted.Of note patient has been off her Eliquis due to her recent foot surgery.     Today, patient denies any new complaints, denies any chest pain, abdominal pain, nausea/vomiting, fever/chills.  Patient stable to be discharged to SNF for further rehab needs    Hospital Course:  Principal Problem:   Atrial fibrillation with RVR ([place]) Active Problems:   OSA on CPAP   Essential hypertension, benign   GERD (gastroesophageal reflux disease)   History of recurrent UTIs   Memory loss   Postural dizziness with presyncope   Gait disturbance   Paroxysmal A-fib (H[facility])   Paroxysmal A. fib with RVR Rate controlled currently off diltiazem drip Continue p.o. Cardizem, Eliquis TSH, free T4 WNL  Acute metabolic encephalopathy possibly 2/2 CAP Vs narcotic induced Avoid narcotics in this patient Improved mentation Hx of Memory loss, ?NPH UA neg, UC < 10,000 growth BC 1 out of 4 grew gram-positive rods, likely contaminant, repeat NGTD  RPR negative, Vit B12 WNL CXR showed small patchy/nodular opacities in the left upper lung zone  CT head on admission, unremarkable, no evidence of acute intracranial abnormality, noted ventriculomegaly EEG with no seizure or epileptiform activities Reconsulted neurology, recommend neurosurgery consult Discussed with neurosurgery Dr. Venetia Constable on 03/23/2020, does not think acute encephalopathy is due to/related to NPH,  and placement of a shunt is not indicated S/P IV ceftriaxone, azithromycin--> p.o. Omnicef to complete 7 days of antibiotics, last day on 03/30/2020  Hyponatremia Improved Continue to hold patient's DDAVP/desmopressin 0.6 mg at bedtime for now (causes hyponatremia) Repeat BMP  Urinary urgency/frequency ?  Overactive bladder Prescribed desmopressin 0.6 mg at bedtime by urologist, will hold this for now due to hyponatremia  Hypertension Labile BP Continue diltiazem, may need as needed hydralazine for better BP control  Difficulty with ambulation status post bilateral foot surgery Patient is s/p bilateral removal of exostosis on 03/18/20 PT/OT consulted-SNF placement Fall precautions Follow-up with podiatry Dr. Boneta Lucks on 03/27/2020 at 1:15 PM  Hypokalemia Replace as needed Repeat labs  Hypothyroidism Continue Synthroid  GERD PPI  OSA Continue CPAP  History of possible ?NPH Memory loss Delirium precautions Continue to follow-up with outpatient neurologist Ativan as needed, started Seroquel at bedtime         Malnutrition Type:      Malnutrition Characteristics:      Nutrition Interventions:      Estimated body mass index is 29.12 kg/m as calculated from the following:   Height as of this encounter: 5\' 5"  (1.651 m).   Weight as of this encounter: 79.4 kg.    Procedures:  None  Consultations:  Neurology  Neurosurgery  Discharge Exam: BP (!) 149/94 (BP Location: Right Arm)   Pulse 85   Temp 98 F (36.7 C) (Oral)   Resp 18   Ht 5\' 5"  (1.651 m)   Wt 79.4 kg   SpO2 96%   BMI 29.12 kg/m   General: NAD Cardiovascular: S1, S2 present Respiratory: CTAB Neurology: Strength equal in all extremities, no obvious focal neurologic deficits noted    Discharge Instructions You were cared for by a hospitalist during your hospital stay. If you have any questions about your discharge medications or the care you received while you were  in the hospital after you are discharged, you can call the unit and asked to speak with the hospitalist on call if the hospitalist that took care of you is not available. Once you are discharged, your primary care physician will handle any further medical issues. Please note that NO REFILLS for any discharge medications will be authorized once you are discharged, as it is imperative that you return to your primary care physician (or establish a relationship with a primary care physician if you do not have one) for your aftercare needs so that they can reassess your need for medications and monitor your lab values.  Discharge Instructions    Diet - low sodium heart healthy   Complete by: As directed    Discharge wound care:   Complete by: As directed    As per podiatry   Increase activity slowly   Complete by: As directed      Allergies as of 03/26/2020      Reactions   Avelox [moxifloxacin Hcl In Nacl] Other (See Comments)   Unknown reaction   Alendronate Sodium Nausea Only      Medication List    STOP taking these medications   desmopressin 0.2 MG tablet Commonly  known as: DDAVP   etodolac 400 MG 24 hr tablet Commonly known as: LODINE XL   oxyCODONE-acetaminophen 5-325 MG tablet Commonly known as: Percocet     TAKE these medications   acetaminophen 500 MG tablet Commonly known as: TYLENOL Take 500 mg by mouth 2 (two) times daily.   atorvastatin 10 MG tablet Commonly known as: LIPITOR TAKE 1 TABLET BY MOUTH EVERY DAY What changed: when to take this   Calcium Carbonate-Vitamin D3 600-400 MG-UNIT Tabs Take 1 tablet by mouth 2 (two) times a day.   cefdinir 300 MG capsule Commonly known as: OMNICEF Take 1 capsule (300 mg total) by mouth 2 (two) times daily for 4 days.   cholecalciferol 1000 units tablet Commonly known as: VITAMIN D Take 1,000 Units by mouth daily.   diltiazem 120 MG 24 hr capsule Commonly known as: CARDIZEM CD TAKE 1 CAPSULE BY MOUTH EVERY DAY What  changed: how much to take   DULoxetine 30 MG capsule Commonly known as: CYMBALTA TAKE 1 CAPSULE BY MOUTH TWICE A DAY What changed: when to take this   Eliquis 5 MG Tabs tablet Generic drug: apixaban TAKE 1 TABLET BY MOUTH TWICE A DAY What changed: how much to take   estradiol 0.1 MG/GM vaginal cream Commonly known as: ESTRACE Place 1 Applicatorful vaginally at bedtime.   fluticasone 50 MCG/ACT nasal spray Commonly known as: FLONASE Place 1 spray into the nose daily as needed for allergies.   GLUCOSAMINE-CHONDROITIN PO Take 2 tablets by mouth daily with lunch.   ibuprofen 200 MG tablet Commonly known as: ADVIL Take 2 tablets (400 mg total) by mouth 2 (two) times daily as needed. What changed:   when to take this  reasons to take this  Another medication with the same name was removed. Continue taking this medication, and follow the directions you see here.   LIPOIC ACID PO Take 2 tablets by mouth 2 (two) times daily.   meclizine 25 MG tablet Commonly known as: ANTIVERT Take 1 tablet by mouth daily as needed for dizziness.   Melatonin 5 MG Caps Take 5 mg by mouth at bedtime.   multivitamin with minerals Tabs tablet Take 1 tablet by mouth daily.   nitrofurantoin 100 MG capsule Commonly known as: MACRODANTIN Take 100 mg by mouth daily.   nitroGLYCERIN 0.4 MG/SPRAY spray Commonly known as: NITROLINGUAL Place 1 spray under the tongue every 5 (five) minutes x 3 doses as needed for chest pain. What changed: reasons to take this   Omega-3 1000 MG Caps Take 2,000 mg by mouth daily.   pantoprazole 40 MG tablet Commonly known as: PROTONIX Take 40 mg by mouth daily before breakfast.   polyethylene glycol 17 g packet Commonly known as: MIRALAX / GLYCOLAX Take 17 g by mouth daily as needed for mild constipation.   QUEtiapine 25 MG tablet Commonly known as: SEROQUEL Take 1 tablet (25 mg total) by mouth at bedtime.   Restasis 0.05 % ophthalmic emulsion Generic  drug: cycloSPORINE Place 1 drop into both eyes 2 (two) times daily.   thyroid 60 MG tablet Commonly known as: ARMOUR Take 60 mg by mouth daily before breakfast.   tiZANidine 4 MG tablet Commonly known as: ZANAFLEX Take 1 tablet (4 mg total) by mouth 3 (three) times daily as needed. What changed: reasons to take this   vitamin C 500 MG tablet Commonly known as: ASCORBIC ACID Take 500 mg by mouth daily.  Discharge Care Instructions  (From admission, onward)         Start     Ordered   03/26/20 0000  Discharge wound care:       Comments: As per podiatry   03/26/20 1408         Allergies  Allergen Reactions  . Avelox [Moxifloxacin Hcl In Nacl] Other (See Comments)    Unknown reaction  . Alendronate Sodium Nausea Only    Follow-up Information    Deland Pretty, MD. Schedule an appointment as soon as possible for a visit in 1 week(s).   Specialty: Internal Medicine Contact information: [address] [place] [address] [phone]        Felipa Furnace, DPM. Go on 03/27/2020.   Specialty: Podiatry Why: Go to office on 03/27/20 at 1:15pm Contact information: [place] [address] [phone]                The results of significant diagnostics from this hospitalization (including imaging, microbiology, ancillary and laboratory) are listed below for reference.    Significant Diagnostic Studies: DG Chest Port 1 View  Result Date: 03/23/2020 CLINICAL DATA:  Acute encephalopathy. EXAM: PORTABLE CHEST 1 VIEW COMPARISON:  Radiograph 2 days ago 03/21/2020. Most recent CT 11/06/2017 FINDINGS: Normal heart size. Stable mediastinal contours with aortic atherosclerosis and tortuosity. Chronic elevation of right hemidiaphragm. Subsegmental atelectasis or scarring at the left lung base unchanged. Small patchy/nodular opacities in the left upper lung zone, not seen on prior. No pulmonary edema, pleural effusion or  pneumothorax. Scoliotic curvature of the upper thoracic spine. No acute osseous abnormalities are seen. IMPRESSION: Small patchy/nodular opacities in the left upper lung zone, new from exam 2 days ago. This may be infectious or inflammatory. Recommend radiographic follow-up to resolution. Electronically Signed   By: Keith Rake M.D.   On: 03/23/2020 18:45   DG Chest Port 1 View  Result Date: 03/21/2020 CLINICAL DATA:  Acute encephalopathy EXAM: PORTABLE CHEST 1 VIEW COMPARISON:  Portable exam 1801 hours compared to 01/24/2020 FINDINGS: Normal heart size, mediastinal contours, and pulmonary vascularity. Atherosclerotic calcification aorta. Mild elevation of RIGHT diaphragm. Minimal LEFT basilar atelectasis. Lungs otherwise clear. No acute infiltrate, pleural effusion or pneumothorax. Bones demineralized. IMPRESSION: Minimal LEFT basilar atelectasis. Aortic Atherosclerosis (ICD10-I70.0). Electronically Signed   By: Lavonia Dana M.D.   On: 03/21/2020 18:49   EEG adult  Result Date: 03/26/2020 Lora Havens, MD     03/26/2020  1:32 PM Patient Name: Domitila Stetler MRN: [id number] Epilepsy Attending: Lora Havens Referring Physician/Provider: Dr Mickeal Skinner Date: 03/26/2020 Duration: 25.45 mins Patient history: 79 year old female with altered mental status.  EEG to evaluate for seizures. Level of alertness: Awake, drowsy, sleep, comatose, lethargic AEDs during EEG study: None Technical aspects: This EEG study was done with scalp electrodes positioned according to the 10-20 International system of electrode placement. Electrical activity was acquired at a sampling rate of 500Hz  and reviewed with a high frequency filter of 70Hz  and a low frequency filter of 1Hz . EEG data were recorded continuously and digitally stored. Description: The posterior dominant rhythm consists of 8 Hz activity of moderate voltage (25-35 uV) seen predominantly in posterior head regions, symmetric and reactive to eye  opening and eye closing. EEG showed intermittent generalized 3 to 6 Hz theta-delta slowing. Physiologic photic driving was seen during photic stimulation.  Hyperventilation was not performed.   ABNORMALITY -Intermittent slow, generalized IMPRESSION: This study is suggestive of mild diffuse  encephalopathy, nonspecific etiology. No seizures or epileptiform discharges were seen throughout the recording. Lora Havens   CT HEAD CODE STROKE WO CONTRAST  Result Date: 03/20/2020 CLINICAL DATA:  Code stroke.  Neuro deficit, acute, stroke suspected EXAM: CT HEAD WITHOUT CONTRAST TECHNIQUE: Contiguous axial images were obtained from the base of the skull through the vertex without intravenous contrast. COMPARISON:  01/24/2020 and prior. FINDINGS: Brain: No acute infarct or intracranial hemorrhage. No mass lesion. No midline shift, ventriculomegaly or extra-axial fluid collection. Mild cerebral atrophy with ex vacuo dilatation. Chronic microvascular ischemic changes. Vascular: No hyperdense vessel or unexpected calcification. Minimal carotid siphon atherosclerotic calcifications. Skull: No acute finding. Sequela of prior left occipital craniectomy. Sinuses/Orbits: No acute orbital finding. Sequela of chronic left maxillary and right sphenoid sinus disease. Other: None. ASPECTS [facility] Stroke Program Early CT Score) - Ganglionic level infarction (caudate, lentiform nuclei, internal capsule, insula, M1-M3 cortex): 7 - Supraganglionic infarction (M4-M6 cortex): 3 Total score (0-10 with 10 being normal): 10 IMPRESSION: 1. No acute intracranial process. 2. Mild cerebral atrophy and chronic microvascular ischemic changes. 3. ASPECTS is 10 Code stroke imaging results were communicated on 03/20/2020 at 9:01 am to provider Dr. Leonel Ramsay via secure text paging. Electronically Signed   By: Primitivo Gauze M.D.   On: 03/20/2020 09:06   DG FL GUIDED LUMBAR PUNCTURE  Result Date: 03/04/2020 CLINICAL DATA:  Gait disturbance  and ventriculomegaly. High-volume lumbar puncture requested to evaluate for normal pressure hydrocephalus. EXAM: DIAGNOSTIC LUMBAR PUNCTURE UNDER FLUOROSCOPIC GUIDANCE FLUOROSCOPY TIME:  Fluoroscopy Time:  17 seconds Radiation Exposure Index (if provided by the fluoroscopic device): 1.5 mGy Number of Acquired Spot Images: 0 PROCEDURE: Informed consent was obtained from the patient prior to the procedure, including potential complications of headache, allergy, and pain. With the patient prone, the lower back was prepped with Betadine. 1% Lidocaine was used for local anesthesia. Lumbar puncture was performed at the L3-L4 level via a right interlaminar approach using a 3.5 inch 20 gauge needle with return of clear, colorless CSF with an opening pressure of 13 cm water. 35 ml of CSF were removed. No laboratory studies were requested. The patient tolerated the procedure well and there were no apparent complications. IMPRESSION: Technically successful fluoroscopically guided lumbar puncture. Electronically Signed   By: Titus Dubin M.D.   On: 03/04/2020 08:46    Microbiology: Recent Results (from the past 240 hour(s))  Resp Panel by RT-PCR (Flu A&B, Covid) Nasopharyngeal Swab     Status: None   Collection Time: 03/20/20  9:17 AM   Specimen: Nasopharyngeal Swab; Nasopharyngeal(NP) swabs in vial transport medium  Result Value Ref Range Status   SARS Coronavirus 2 by RT PCR NEGATIVE NEGATIVE Final    Comment: (NOTE) SARS-CoV-2 target nucleic acids are NOT DETECTED.  The SARS-CoV-2 RNA is generally detectable in upper respiratory specimens during the acute phase of infection. The lowest concentration of SARS-CoV-2 viral copies this assay can detect is 138 copies/mL. A negative result does not preclude SARS-Cov-2 infection and should not be used as the sole basis for treatment or other patient management decisions. A negative result may occur with  improper specimen collection/handling, submission of  specimen other than nasopharyngeal swab, presence of viral mutation(s) within the areas targeted by this assay, and inadequate number of viral copies(<138 copies/mL). A negative result must be combined with clinical observations, patient history, and epidemiological information. The expected result is Negative.  Fact Sheet for Patients:  EntrepreneurPulse.com.au  Fact Sheet for Healthcare Providers:  IncredibleEmployment.be  This test is no t yet approved or cleared by the [place] and  has been authorized for detection and/or diagnosis of SARS-CoV-2 by FDA under an Emergency Use Authorization (EUA). This EUA will remain  in effect (meaning this test can be used) for the duration of the COVID-19 declaration under Section 564(b)(1) of the Act, 21 U.S.C.section 360bbb-3(b)(1), unless the authorization is terminated  or revoked sooner.       Influenza A by PCR NEGATIVE NEGATIVE Final   Influenza B by PCR NEGATIVE NEGATIVE Final    Comment: (NOTE) The Xpert Xpress SARS-CoV-2/FLU/RSV plus assay is intended as an aid in the diagnosis of influenza from Nasopharyngeal swab specimens and should not be used as a sole basis for treatment. Nasal washings and aspirates are unacceptable for Xpert Xpress SARS-CoV-2/FLU/RSV testing.  Fact Sheet for Patients: EntrepreneurPulse.com.au  Fact Sheet for Healthcare Providers: IncredibleEmployment.be  This test is not yet approved or cleared by the [place] FDA and has been authorized for detection and/or diagnosis of SARS-CoV-2 by FDA under an Emergency Use Authorization (EUA). This EUA will remain in effect (meaning this test can be used) for the duration of the COVID-19 declaration under Section 564(b)(1) of the Act, 21 U.S.C. section 360bbb-3(b)(1), unless the authorization is terminated or revoked.  Performed at [facility], [place] [address]  [place], [place] [address]   SARS Coronavirus 2 by RT PCR (hospital order, performed in [facility] hospital lab) Nasopharyngeal Nasopharyngeal Swab     Status: None   Collection Time: 03/21/20  4:55 PM   Specimen: Nasopharyngeal Swab  Result Value Ref Range Status   SARS Coronavirus 2 NEGATIVE NEGATIVE Final    Comment: (NOTE) SARS-CoV-2 target nucleic acids are NOT DETECTED.  The SARS-CoV-2 RNA is generally detectable in upper and lower respiratory specimens during the acute phase of infection. The lowest concentration of SARS-CoV-2 viral copies this assay can detect is 250 copies / mL. A negative result does not preclude SARS-CoV-2 infection and should not be used as the sole basis for treatment or other patient management decisions.  A negative result may occur with improper specimen collection / handling, submission of specimen other than nasopharyngeal swab, presence of viral mutation(s) within the areas targeted by this assay, and inadequate number of viral copies (<250 copies / mL). A negative result must be combined with clinical observations, patient history, and epidemiological information.  Fact Sheet for Patients:   StrictlyIdeas.no  Fact Sheet for Healthcare Providers: BankingDealers.co.za  This test is not yet approved or  cleared by the [place] FDA and has been authorized for detection and/or diagnosis of SARS-CoV-2 by FDA under an Emergency Use Authorization (EUA).  This EUA will remain in effect (meaning this test can be used) for the duration of the COVID-19 declaration under Section 564(b)(1) of the Act, 21 U.S.C. section 360bbb-3(b)(1), unless the authorization is terminated or revoked sooner.  Performed at [facility], [place] [address] [place], [place] [address]   Culture, blood (routine x 2)     Status: Abnormal   Collection Time: 03/23/20  8:14 PM   Specimen: BLOOD  Result Value Ref Range Status    Specimen Description BLOOD RIGHT ANTECUBITAL  Final   Special Requests   Final    BOTTLES DRAWN AEROBIC AND ANAEROBIC Blood Culture adequate volume   Culture  Setup Time   Final    GRAM POSITIVE RODS AEROBIC BOTTLE ONLY CRITICAL RESULT CALLED TO, READ BACK BY AND VERIFIED  WITH: PHARMD CATHY P. [phone] FCP     Culture (A)  Final    BACILLUS SPECIES Standardized susceptibility testing for this organism is not available. Performed at [facility], [place] [address] [place], [place] [address]    Report Status 03/25/2020 FINAL  Final  Culture, blood (routine x 2)     Status: None (Preliminary result)   Collection Time: 03/23/20  8:14 PM   Specimen: BLOOD  Result Value Ref Range Status   Specimen Description BLOOD SITE NOT SPECIFIED  Final   Special Requests   Final    BOTTLES DRAWN AEROBIC AND ANAEROBIC Blood Culture results may not be optimal due to an excessive volume of blood received in culture bottles   Culture   Final    NO GROWTH 2 DAYS Performed at [facility], [place] [address] [place], [place] [address]    Report Status PENDING  Incomplete  Culture, Urine     Status: Abnormal   Collection Time: 03/23/20 10:18 PM   Specimen: Urine, Clean Catch  Result Value Ref Range Status   Specimen Description URINE, CLEAN CATCH  Final   Special Requests NONE  Final   Culture (A)  Final    <10,000 COLONIES/mL INSIGNIFICANT GROWTH Performed at [facility], [place] [address] [place], [place] [address]    Report Status 03/25/2020 FINAL  Final  Culture, blood (routine x 2)     Status: None (Preliminary result)   Collection Time: 03/24/20  3:24 PM   Specimen: BLOOD  Result Value Ref Range Status   Specimen Description BLOOD BLOOD RIGHT HAND  Final   Special Requests   Final    BOTTLES DRAWN AEROBIC ONLY Blood Culture adequate volume   Culture   Final    NO GROWTH < 24 HOURS Performed at [facility], [place] [address] [place], [place] [address]    Report Status  PENDING  Incomplete  Culture, blood (routine x 2)     Status: None (Preliminary result)   Collection Time: 03/24/20  3:24 PM   Specimen: BLOOD  Result Value Ref Range Status   Specimen Description BLOOD RIGHT ANTECUBITAL  Final   Special Requests   Final    BOTTLES DRAWN AEROBIC ONLY Blood Culture adequate volume   Culture   Final    NO GROWTH < 24 HOURS Performed at [facility], [place] [address], [place], [place] [address]    Report Status PENDING  Incomplete  SARS Coronavirus 2 by RT PCR (hospital order, performed in [facility] hospital lab) Nasopharyngeal Nasopharyngeal Swab     Status: None   Collection Time: 03/25/20  9:47 AM   Specimen: Nasopharyngeal Swab  Result Value Ref Range Status   SARS Coronavirus 2 NEGATIVE NEGATIVE Final    Comment: (NOTE) SARS-CoV-2 target nucleic acids are NOT DETECTED.  The SARS-CoV-2 RNA is generally detectable in upper and lower respiratory specimens during the acute phase of infection. The lowest concentration of SARS-CoV-2 viral copies this assay can detect is 250 copies / mL. A negative result does not preclude SARS-CoV-2 infection and should not be used as the sole basis for treatment or other patient management decisions.  A negative result may occur with improper specimen collection / handling, submission of specimen other than nasopharyngeal swab, presence of viral mutation(s) within the areas targeted by this assay, and inadequate number of viral copies (<250 copies / mL). A negative result must be combined with clinical observations,  patient history, and epidemiological information.  Fact Sheet for Patients:   StrictlyIdeas.no  Fact Sheet for Healthcare Providers: BankingDealers.co.za  This test is not yet approved or  cleared by the [place] FDA and has been authorized for detection and/or diagnosis of SARS-CoV-2 by FDA under an Emergency Use Authorization (EUA).  This EUA  will remain in effect (meaning this test can be used) for the duration of the COVID-19 declaration under Section 564(b)(1) of the Act, 21 U.S.C. section 360bbb-3(b)(1), unless the authorization is terminated or revoked sooner.  Performed at [facility], [place] [address] [place], [place] [address]      Labs: Basic Metabolic Panel: Recent Labs  Lab 03/21/20 0222 03/22/20 0554 03/23/20 0218 03/24/20 0541 03/25/20 0639 03/25/20 1308 03/26/20 0659  NA 138   < > 134* 130* 127* 129* 132*  K 3.3*   < > 3.4* 3.7 3.4* 3.3* 3.4*  CL 102   < > 100 97* 92* 93* 97*  CO2 25   < > 23 22 23 24 22   GLUCOSE 114*   < > 113* 103* 102* 128* 114*  BUN 18   < > 11 21 18 16 11   CREATININE 0.68   < > 0.53 0.64 0.62 0.68 0.57  CALCIUM 9.2   < > 9.1 9.6 9.3 9.1 9.9  MG 1.9  --   --   --   --   --   --    < > = values in this interval not displayed.   Liver Function Tests: Recent Labs  Lab 03/20/20 0849  AST 26  ALT 18  ALKPHOS 41  BILITOT 0.8  PROT 6.0*  ALBUMIN 3.3*   No results for input(s): LIPASE, AMYLASE in the last 168 hours. Recent Labs  Lab 03/23/20 2039  AMMONIA 24   CBC: Recent Labs  Lab 03/20/20 0849 03/20/20 0850 03/21/20 0222 03/22/20 0057 03/23/20 0218 03/24/20 0541 03/25/20 0639  WBC 9.9  --  8.7 8.4 9.7 8.8 8.2  NEUTROABS 6.9  --   --  5.9 7.1 5.7 5.0  HGB 11.7*   < > 11.1* 11.8* 12.0 12.4 12.6  HCT 36.7   < > 32.7* 35.6* 35.7* 35.8* 34.8*  MCV 95.1  --  92.4 91.5 91.1 89.9 87.9  PLT 203  --  215 208 228 278 245   < > = values in this interval not displayed.   Cardiac Enzymes: No results for input(s): CKTOTAL, CKMB, CKMBINDEX, TROPONINI in the last 168 hours. BNP: BNP (last 3 results) No results for input(s): BNP in the last 8760 hours.  ProBNP (last 3 results) No results for input(s): PROBNP in the last 8760 hours.  CBG: Recent Labs  Lab 03/20/20 0844  GLUCAP 109*       Signed:  Alma Friendly, MD [facility] 03/26/2020, 2:13 PM

## 2020-03-26 NOTE — Progress Notes (Signed)
Patient discharged via [facility].  Patient belongings of a brown pocket book & black coat was sent with the patient.  Patient's husband took remaining of patient's belongings with him.  CPAP was taken with husband.

## 2020-03-26 NOTE — TOC Transition Note (Addendum)
Transition of Care [facility]) - CM/SW Discharge Note   Patient Details  Name: Cynthia [name] MRN: [id number] Date of Birth: Aug 13, 1940  [facility] CM/SW Contact:  Cynthia [name], [place] Phone Number: 03/26/2020, 2:48 PM   Clinical Narrative:     Patient will DC to: [facility]   Anticipated DC date: 03/26/2020  Family notified: Cynthia [name]   Transport by: Cynthia [name]   ?  Per MD patient ready for DC to [facility] . RN, patient, patient's family, and facility notified of DC. Discharge Summary sent to facility. RN given number for report tele# [phone] RM#[phone]. Patient will take CPAP machine to facility from home.DC packet on chart. Ambulance transport requested for patient.  CSW signing off.   Final next level of care: Skilled Nursing Facility Barriers to Discharge: No Barriers Identified   Patient Goals and CMS Choice Patient states their goals for this hospitalization and ongoing recovery are:: to go to SNF CMS Medicare.gov Compare Post Acute Care list provided to:: Patient (Cynthia [name] spouse) Choice offered to / list presented to : Patient,Spouse Cynthia [name])  Discharge Placement              Patient chooses bed at: [place] [place] Patient to be transferred to facility by: [place] Name of family member notified: Cynthia [name] Patient and family notified of of transfer: 03/26/20  Discharge Plan and Services     Post Acute Care Choice: [place]                               Social Determinants of Health (SDOH) Interventions     Readmission Risk Interventions No flowsheet data found.

## 2020-03-26 NOTE — Progress Notes (Signed)
EEG complete - results pending 

## 2020-03-26 NOTE — Plan of Care (Signed)

## 2020-03-26 NOTE — Plan of Care (Signed)
  Problem: Clinical Measurements: Goal: Ability to maintain clinical measurements within normal limits will improve Outcome: Progressing   Problem: Clinical Measurements: Goal: Will remain free from infection Outcome: Progressing   

## 2020-03-26 NOTE — Procedures (Signed)
Patient Name: Cynthia [name]  MRN: [id number]  Epilepsy Attending: Lora Havens  Referring Physician/Provider: Dr Mickeal Skinner Date: 03/26/2020 Duration: 25.45 mins  Patient history: 79 year old female with altered mental status.  EEG to evaluate for seizures.  Level of alertness: Awake, drowsy, sleep, comatose, lethargic  AEDs during EEG study: None  Technical aspects: This EEG study was done with scalp electrodes positioned according to the 10-20 International system of electrode placement. Electrical activity was acquired at a sampling rate of 500Hz  and reviewed with a high frequency filter of 70Hz  and a low frequency filter of 1Hz . EEG data were recorded continuously and digitally stored.   Description: The posterior dominant rhythm consists of 8 Hz activity of moderate voltage (25-35 uV) seen predominantly in posterior head regions, symmetric and reactive to eye opening and eye closing. EEG showed intermittent generalized 3 to 6 Hz theta-delta slowing. Physiologic photic driving was seen during photic stimulation.  Hyperventilation was not performed.     ABNORMALITY -Intermittent slow, generalized  IMPRESSION: This study is suggestive of mild diffuse encephalopathy, nonspecific etiology. No seizures or epileptiform discharges were seen throughout the recording.  Jami Ohlin Barbra Sarks

## 2020-03-26 NOTE — Progress Notes (Signed)
Occupational Therapy Treatment Patient Details Name: Cynthia [name] MRN: [id number] DOB: 1941-01-24 Today's Date: 03/26/2020    History of present illness Pt is a 79 y/o female admitted secondary to AMS and inability to ambulate. Pt with recent bilateral foot callous removal. PMH includes a fib.    OT comments  Patient making minimal progress towards goals in skilled OT session. Patient's session encompassed sit<>stands in order to progress with functional mobility. While patient has mild dementia at baseline, she was unable to appropriately motor plan getting to EOB and completing sit<>stands without increased cues and assist, and pt stating she was hungry but lunch was sitting beside her untouched upon therapist's entry into the room. Pt requiring increased time and step by step commands in order to participate in therapy session, with heavy posterior lean with each sit<>stand, requiring max A from therapist. Discharge remains appropriate, therapy will continue to follow.    Follow Up Recommendations  SNF    Equipment Recommendations  3 in 1 bedside commode;Tub/shower seat;Wheelchair (measurements OT);Wheelchair cushion (measurements OT)    Recommendations for Other Services      Precautions / Restrictions Precautions Precautions: Fall Required Braces or Orthoses: Other Brace Other Brace: bilateral post op shoes Restrictions Weight Bearing Restrictions: No RLE Weight Bearing: Weight bearing as tolerated LLE Weight Bearing: Weight bearing as tolerated       Mobility Bed Mobility Overal bed mobility: Needs Assistance Bed Mobility: Supine to Sit;Sit to Supine     Supine to sit: Min guard;HOB elevated Sit to supine: Min guard;HOB elevated   General bed mobility comments: Assist for LEs and trunk  Transfers Overall transfer level: Needs assistance Equipment used: Rolling walker (2 wheeled) Transfers: Sit to/from Stand Sit to Stand: Max assist         General transfer  comment: attempted to complete functional ambulation, but unable to motor plan with RW and noted to have significant posterior lean each time. Pt completing 4 sit<>stand transfers, but heavily dependent on therapist in order to remain upright due to posterior lean    Balance Overall balance assessment: Needs assistance Sitting-balance support: No upper extremity supported;Feet supported Sitting balance-Leahy Scale: Fair       Standing balance-Leahy Scale: Zero Standing balance comment: reliant on max A in order to remain upright                           ADL either performed or assessed with clinical judgement   ADL Overall ADL's : Needs assistance/impaired Eating/Feeding: Set up                                   Functional mobility during ADLs: Maximal assistance;Rolling walker General ADL Comments: pt with increased confusion, posterior lean and lack of coordination requiring max A to complete sit<>stands x4 with significant posterior lean prohibiting attempts at gait     Vision       Perception     Praxis      Cognition Arousal/Alertness: Awake/alert Behavior During Therapy: Impulsive Overall Cognitive Status: Impaired/Different from baseline Area of Impairment: Attention;Memory;Following commands;Safety/judgement;Awareness;Problem solving;Orientation                 Orientation Level: Time Current Attention Level: Focused Memory: Decreased recall of precautions;Decreased short-term memory Following Commands: Follows one step commands with increased time Safety/Judgement: Decreased awareness of safety;Decreased awareness of deficits Awareness: Emergent Problem Solving: Slow  processing;Decreased initiation;Difficulty sequencing;Requires verbal cues;Requires tactile cues General Comments: Mild dementia at baseline, however unable to appropriately motor plan getting to EOB and completing sit<>stands without increased cues and assist, pt  stating she was hungry but lunch was sitting beside her untouched        Exercises     Shoulder Instructions       General Comments      Pertinent Vitals/ Pain       Pain Assessment: Faces Faces Pain Scale: Hurts little more Pain Location: bilateral feet Pain Descriptors / Indicators: Grimacing;Guarding;Discomfort Pain Intervention(s): Limited activity within patient's tolerance;Monitored during session;Repositioned  Home Living                                          Prior Functioning/Environment              Frequency  Min 2X/week        Progress Toward Goals  OT Goals(current goals can now be found in the care plan section)  Progress towards OT goals: Progressing toward goals  Acute Rehab OT Goals Patient Stated Goal: I need to move better. OT Goal Formulation: With patient Time For Goal Achievement: 04/04/20 Potential to Achieve Goals: [place]  Plan Discharge plan remains appropriate    Co-evaluation                 AM-PAC OT "6 Clicks" Daily Activity     Outcome Measure   Help from another person eating meals?: None Help from another person taking care of personal grooming?: A Little Help from another person toileting, which includes using toliet, bedpan, or urinal?: A Lot Help from another person bathing (including washing, rinsing, drying)?: A Lot Help from another person to put on and taking off regular upper body clothing?: A Lot Help from another person to put on and taking off regular lower body clothing?: A Lot 6 Click Score: 15    End of Session Equipment Utilized During Treatment: Gait belt;Rolling walker;Other (comment) (post op shoes)  OT Visit Diagnosis: Unsteadiness on feet (R26.81);Muscle weakness (generalized) (M62.81);History of falling (Z91.81);Other symptoms and signs involving cognitive function;Pain Pain - Right/Left: Left Pain - part of body: Ankle and joints of foot   Activity Tolerance Patient  limited by pain;Patient limited by fatigue;Other (comment) (increased confusion to complete basic tasks)   Patient Left in bed;with call bell/phone within reach;with bed alarm set   Nurse Communication Mobility status        Time: 1331-1401 OT Time Calculation (min): 30 min  Charges: OT General Charges $OT Visit: 1 Visit OT Treatments $Self Care/Home Management : 23-37 mins  [place]. Micah Barnier, COTA/L Acute Rehabilitation Services [place] 03/26/2020, 3:20 PM

## 2020-03-27 ENCOUNTER — Encounter: Payer: Medicare PPO | Admitting: Podiatry

## 2020-03-27 ENCOUNTER — Telehealth: Payer: Self-pay | Admitting: Podiatry

## 2020-03-27 ENCOUNTER — Other Ambulatory Visit: Payer: Self-pay | Admitting: Cardiology

## 2020-03-27 DIAGNOSIS — R0789 Other chest pain: Secondary | ICD-10-CM

## 2020-03-27 NOTE — Telephone Encounter (Signed)
[facility] called stating the patient needs wound dressing orders. Please advise.

## 2020-03-27 NOTE — Telephone Encounter (Signed)
Is Betadine wet-to-dry dressing changes every other day

## 2020-03-29 ENCOUNTER — Other Ambulatory Visit: Payer: Self-pay

## 2020-03-29 ENCOUNTER — Ambulatory Visit (INDEPENDENT_AMBULATORY_CARE_PROVIDER_SITE_OTHER): Payer: Medicare PPO

## 2020-03-29 ENCOUNTER — Ambulatory Visit (INDEPENDENT_AMBULATORY_CARE_PROVIDER_SITE_OTHER): Payer: Medicare PPO | Admitting: Podiatry

## 2020-03-29 ENCOUNTER — Encounter: Payer: Self-pay | Admitting: Podiatry

## 2020-03-29 DIAGNOSIS — Z9889 Other specified postprocedural states: Secondary | ICD-10-CM

## 2020-03-29 DIAGNOSIS — M898X7 Other specified disorders of bone, ankle and foot: Secondary | ICD-10-CM | POA: Diagnosis not present

## 2020-03-29 LAB — CULTURE, BLOOD (ROUTINE X 2)
Culture: NO GROWTH
Culture: NO GROWTH
Culture: NO GROWTH
Special Requests: ADEQUATE
Special Requests: ADEQUATE

## 2020-03-29 NOTE — Progress Notes (Signed)
Subjective:  [name], female    DOB: [birth year],  MRN: [id number]  Chief Complaint  Patient presents with   Routine Post Op    DOS: 03/04/2020 Procedure: Bilateral midfoot exostectomy  79 y.o. female returns for post-op check.  Patient is doing well.  Patient was recently admitted to the hospital for A. fib.  Patient states she is doing good overall.  She is at a nursing facility.  She denies any other acute complaints.  To be doing regular dressing changes.  Review of Systems: Negative except as noted in the HPI. Denies N/V/F/Ch.  Past Medical History:  Diagnosis Date   Acute blood loss anemia 10/27/2017   Acute GI bleeding 10/27/2017   Anemia    Arthritis    "joints" (11/10/2017)   Dizziness    GERD (gastroesophageal reflux disease)    GI bleed 11/06/2017   Hearing loss    History of blood transfusion 10/2017   Hyperlipidemia    Hyperthyroidism    Melanoma ([place])    "cut off my back"   Memory loss    Sinus headache    Sleep apnea     Current Outpatient Medications:    acetaminophen (TYLENOL) 500 MG tablet, Take 500 mg by mouth 2 (two) times daily. , Disp: , Rfl:    Alpha-Lipoic Acid (LIPOIC ACID PO), Take 2 tablets by mouth 2 (two) times daily. , Disp: , Rfl:    atorvastatin (LIPITOR) 10 MG tablet, TAKE 1 TABLET BY MOUTH EVERY DAY (Patient taking differently: Take 10 mg by mouth 2 (two) times daily. ), Disp: 90 tablet, Rfl: 3   Calcium Carbonate-Vitamin D3 600-400 MG-UNIT TABS, Take 1 tablet by mouth 2 (two) times a day. , Disp: , Rfl:    cefdinir (OMNICEF) 300 MG capsule, Take 1 capsule (300 mg total) by mouth 2 (two) times daily for 4 days., Disp: 8 capsule, Rfl: 0   cholecalciferol (VITAMIN D) 1000 UNITS tablet, Take 1,000 Units by mouth daily., Disp: , Rfl:    cycloSPORINE (RESTASIS) 0.05 % ophthalmic emulsion, Place 1 drop into both eyes 2 (two) times daily. , Disp: , Rfl:    diltiazem (CARDIZEM CD) 120 MG 24 hr capsule, TAKE 1  CAPSULE BY MOUTH EVERY DAY (Patient taking differently: Take 120 mg by mouth daily. ), Disp: 90 capsule, Rfl: 1   DULoxetine (CYMBALTA) 30 MG capsule, TAKE 1 CAPSULE BY MOUTH TWICE A DAY (Patient taking differently: Take 30 mg by mouth daily. ), Disp: 180 capsule, Rfl: 3   ELIQUIS 5 MG TABS tablet, TAKE 1 TABLET BY MOUTH TWICE A DAY (Patient taking differently: Take 5 mg by mouth 2 (two) times daily. ), Disp: 60 tablet, Rfl: 5   estradiol (ESTRACE) 0.1 MG/GM vaginal cream, Place 1 Applicatorful vaginally at bedtime., Disp: , Rfl:    fluticasone (FLONASE) 50 MCG/ACT nasal spray, Place 1 spray into the nose daily as needed for allergies. , Disp: , Rfl:    GLUCOSAMINE-CHONDROITIN PO, Take 2 tablets by mouth daily with lunch. , Disp: , Rfl:    ibuprofen (ADVIL) 200 MG tablet, Take 2 tablets (400 mg total) by mouth 2 (two) times daily as needed., Disp: 30 tablet, Rfl: 0   meclizine (ANTIVERT) 25 MG tablet, Take 1 tablet by mouth daily as needed for dizziness. , Disp: , Rfl:    Melatonin 5 MG CAPS, Take 5 mg by mouth at bedtime. , Disp: , Rfl:    Multiple Vitamin (MULTIVITAMIN WITH MINERALS) TABS tablet, Take  1 tablet by mouth daily., Disp: , Rfl:    nitrofurantoin (MACRODANTIN) 100 MG capsule, Take 100 mg by mouth daily. , Disp: , Rfl:    nitroGLYCERIN (NITROLINGUAL) 0.4 MG/SPRAY spray, PLACE 1 SPRAY UNDER THE TONGUE EVERY 5 (FIVE) MINUTES FOR 3 DOSES AS NEEDED FOR CHEST PAIN, Disp: 12 g, Rfl: 1   Omega-3 1000 MG CAPS, Take 2,000 mg by mouth daily. , Disp: , Rfl:    pantoprazole (PROTONIX) 40 MG tablet, Take 40 mg by mouth daily before breakfast. , Disp: , Rfl: 1   polyethylene glycol (MIRALAX / GLYCOLAX) 17 g packet, Take 17 g by mouth daily as needed for mild constipation., Disp: 14 each, Rfl: 0   QUEtiapine (SEROQUEL) 25 MG tablet, Take 1 tablet (25 mg total) by mouth at bedtime., Disp: , Rfl:    thyroid (ARMOUR) 60 MG tablet, Take 60 mg by mouth daily before breakfast., Disp: , Rfl:     tiZANidine (ZANAFLEX) 4 MG tablet, Take 1 tablet (4 mg total) by mouth 3 (three) times daily as needed. (Patient taking differently: Take 4 mg by mouth 3 (three) times daily as needed for muscle spasms. ), Disp: 90 tablet, Rfl: 5   vitamin C (ASCORBIC ACID) 500 MG tablet, Take 500 mg by mouth daily. , Disp: , Rfl:   Social History   Tobacco Use  Smoking Status Former Smoker   Years: 2.00   Types: Cigarettes   Quit date: 1980   Years since quitting: 41.9  Smokeless Tobacco Never Used  Tobacco Comment   11/10/2017 "only smoked when I was on my period"    Allergies  Allergen Reactions   Avelox [Moxifloxacin Hcl In Nacl] Other (See Comments)    Unknown reaction   Alendronate Sodium Nausea Only   Objective:  There were no vitals filed for this visit. There is no height or weight on file to calculate BMI. Constitutional Well developed. Well nourished.  Vascular Foot warm and well perfused. Capillary refill normal to all digits.   Neurologic Normal speech. Oriented to person, place, and time. Epicritic sensation to light touch grossly present bilaterally.  Dermatologic Skin healing well without signs of infection. Skin edges well coapted without signs of infection.  Orthopedic: Tenderness to palpation noted about the surgical site.   Radiographs: Adequate resection noted.  3 views of skeletally mature adult bilateral foot: Adequate resection good correction alignment noted.  Of pes planovalgus foot deformity noted. Assessment:   1. Post-operative state   2. Exostosis of right foot   3. Exostosis of left foot   4. Status post foot surgery    Plan:  Patient was evaluated and treated and all questions answered.  S/p foot surgery bilaterally -Progressing as expected post-operatively. -XR: See above -WB Status: Weightbearing as tolerated in surgical shoe -Sutures: Intact.  Mild superficial dehiscence noted to the right side.  No clinical signs of infection  noted. -Medications: None -Foot redressed with Betadine wet-to-dry dressing changes.  I have asked the nursing facility to do Betadine wet-to-dry dressing changes every other day.  No follow-ups on file.

## 2020-03-30 DIAGNOSIS — I4891 Unspecified atrial fibrillation: Secondary | ICD-10-CM | POA: Diagnosis not present

## 2020-03-30 DIAGNOSIS — N390 Urinary tract infection, site not specified: Secondary | ICD-10-CM | POA: Diagnosis not present

## 2020-03-30 DIAGNOSIS — R413 Other amnesia: Secondary | ICD-10-CM | POA: Diagnosis not present

## 2020-03-30 DIAGNOSIS — K219 Gastro-esophageal reflux disease without esophagitis: Secondary | ICD-10-CM | POA: Diagnosis not present

## 2020-03-30 DIAGNOSIS — R269 Unspecified abnormalities of gait and mobility: Secondary | ICD-10-CM | POA: Diagnosis not present

## 2020-03-30 DIAGNOSIS — G4733 Obstructive sleep apnea (adult) (pediatric): Secondary | ICD-10-CM | POA: Diagnosis not present

## 2020-03-30 DIAGNOSIS — I10 Essential (primary) hypertension: Secondary | ICD-10-CM | POA: Diagnosis not present

## 2020-03-30 DIAGNOSIS — I480 Paroxysmal atrial fibrillation: Secondary | ICD-10-CM | POA: Diagnosis not present

## 2020-04-05 DIAGNOSIS — R300 Dysuria: Secondary | ICD-10-CM | POA: Diagnosis not present

## 2020-04-05 DIAGNOSIS — R350 Frequency of micturition: Secondary | ICD-10-CM | POA: Diagnosis not present

## 2020-04-10 ENCOUNTER — Other Ambulatory Visit: Payer: Self-pay

## 2020-04-10 ENCOUNTER — Encounter: Payer: Medicare PPO | Admitting: Podiatry

## 2020-04-10 ENCOUNTER — Emergency Department (HOSPITAL_COMMUNITY): Payer: Medicare PPO

## 2020-04-10 ENCOUNTER — Emergency Department (HOSPITAL_COMMUNITY)
Admission: EM | Admit: 2020-04-10 | Discharge: 2020-04-10 | Disposition: A | Discharge status: Home / Self Care | Payer: Medicare PPO | Attending: Emergency Medicine | Admitting: Emergency Medicine

## 2020-04-10 DIAGNOSIS — R55 Syncope and collapse: Secondary | ICD-10-CM | POA: Diagnosis not present

## 2020-04-10 DIAGNOSIS — Z87891 Personal history of nicotine dependence: Secondary | ICD-10-CM | POA: Insufficient documentation

## 2020-04-10 DIAGNOSIS — Z7901 Long term (current) use of anticoagulants: Secondary | ICD-10-CM | POA: Insufficient documentation

## 2020-04-10 DIAGNOSIS — Z79899 Other long term (current) drug therapy: Secondary | ICD-10-CM | POA: Diagnosis not present

## 2020-04-10 DIAGNOSIS — I10 Essential (primary) hypertension: Secondary | ICD-10-CM | POA: Insufficient documentation

## 2020-04-10 DIAGNOSIS — I4891 Unspecified atrial fibrillation: Secondary | ICD-10-CM | POA: Diagnosis not present

## 2020-04-10 LAB — CBC WITH DIFFERENTIAL/PLATELET
Abs Immature Granulocytes: 0.01 10*3/uL (ref 0.00–0.07)
Basophils Absolute: 0 10*3/uL (ref 0.0–0.1)
Basophils Relative: 1 %
Eosinophils Absolute: 0.1 10*3/uL (ref 0.0–0.5)
Eosinophils Relative: 2 %
HCT: 35.7 % — ABNORMAL LOW (ref 36.0–46.0)
Hemoglobin: 11.8 g/dL — ABNORMAL LOW (ref 12.0–15.0)
Immature Granulocytes: 0 %
Lymphocytes Relative: 19 %
Lymphs Abs: 1.2 10*3/uL (ref 0.7–4.0)
MCH: 31.6 pg (ref 26.0–34.0)
MCHC: 33.1 g/dL (ref 30.0–36.0)
MCV: 95.7 fL (ref 80.0–100.0)
Monocytes Absolute: 0.6 10*3/uL (ref 0.1–1.0)
Monocytes Relative: 10 %
Neutro Abs: 4.2 10*3/uL (ref 1.7–7.7)
Neutrophils Relative %: 68 %
Platelets: 280 10*3/uL (ref 150–400)
RBC: 3.73 MIL/uL — ABNORMAL LOW (ref 3.87–5.11)
RDW: 13.8 % (ref 11.5–15.5)
WBC: 6.2 10*3/uL (ref 4.0–10.5)
nRBC: 0 % (ref 0.0–0.2)

## 2020-04-10 LAB — URINALYSIS, ROUTINE W REFLEX MICROSCOPIC
Bilirubin Urine: NEGATIVE
Glucose, UA: NEGATIVE mg/dL
Hgb urine dipstick: NEGATIVE
Ketones, ur: NEGATIVE mg/dL
Leukocytes,Ua: NEGATIVE
Nitrite: NEGATIVE
Protein, ur: NEGATIVE mg/dL
Specific Gravity, Urine: 1.012 (ref 1.005–1.030)
pH: 7 (ref 5.0–8.0)

## 2020-04-10 LAB — COMPREHENSIVE METABOLIC PANEL
ALT: 19 U/L (ref 0–44)
AST: 20 U/L (ref 15–41)
Albumin: 3.4 g/dL — ABNORMAL LOW (ref 3.5–5.0)
Alkaline Phosphatase: 47 U/L (ref 38–126)
Anion gap: 10 (ref 5–15)
BUN: 19 mg/dL (ref 8–23)
CO2: 27 mmol/L (ref 22–32)
Calcium: 9.3 mg/dL (ref 8.9–10.3)
Chloride: 107 mmol/L (ref 98–111)
Creatinine, Ser: 0.66 mg/dL (ref 0.44–1.00)
GFR, Estimated: >60 mL/min (ref >60)
Glucose, Bld: 105 mg/dL — ABNORMAL HIGH (ref 70–99)
Potassium: 3.2 mmol/L — ABNORMAL LOW (ref 3.5–5.1)
Sodium: 144 mmol/L (ref 135–145)
Total Bilirubin: 0.8 mg/dL (ref 0.3–1.2)
Total Protein: 6.1 g/dL — ABNORMAL LOW (ref 6.5–8.1)

## 2020-04-10 MED ORDER — POTASSIUM CHLORIDE CRYS ER 20 MEQ PO TBCR
40.0000 meq | EXTENDED_RELEASE_TABLET | Freq: Once | ORAL | Status: AC
  Administered 2020-04-10: 40 meq via ORAL
  Filled 2020-04-10: qty 2

## 2020-04-10 MED ORDER — SODIUM CHLORIDE 0.9 % IV BOLUS
500.0000 mL | Freq: Once | INTRAVENOUS | Status: AC
  Administered 2020-04-10: 500 mL via INTRAVENOUS

## 2020-04-10 NOTE — ED Triage Notes (Signed)
Pt arrived from [facility] by EMS after syncopal event witnesses by staff.   Medics states initial rhythm was Afib RVR in the 140s  Not then converted to NSR  Pt alert and oriented on arrival Denies pain at this time

## 2020-04-10 NOTE — ED Notes (Signed)
ED Tech is wrapping pt's feet at this time with a protective/dry covering.

## 2020-04-10 NOTE — ED Notes (Addendum)
Pt tolerated activity well. Pt endorses feeling unsteady while standing but denies feelings of lightheadedness or dizziness during the activity.    EDP notified of these findings.

## 2020-04-10 NOTE — ED Provider Notes (Signed)
[facility] EMERGENCY DEPARTMENT Provider Note   CSN: [id number] Arrival date & time: 04/10/20  7425     History Chief Complaint  Patient presents with  . Loss of Consciousness    Cynthia [name] is a 79 y.o. female.  HPI      79yo female hypertension, hyperlipidemia, atrial fibrillation on cardizem and eliquis, bilateral midfoot exostectomy 11/22, OSA, mild dementia, dizziness/gait abnormality being worked up for NPH, seeing Dr. Jacinto Halim for syncopal episodes presents with a syncopal episode.  Reports here from [facility] (has been there for a month, supposed to be released tomorrow per pt)--suddenly realized she was on the floor and staff nurse was there with her talking her through what happened, sounds like another atrial fibrillation event and taken to hospital. Per EMS was afib RVR in 140s but converted to sinus rhythm in route   Was feeling ok prior to this. Eating and drinking ok, no fevers, no n/v/d black or bloody stools, does not remember feeling lightheaded or dizzy. Had piror episode of syncope a long time ago.   No known symptoms prior to syncope, does not remember chest pain or dyspnea Went to restroom, CNA helping her back to bed and while she was pivoting to get into bed she had syncope, they laid her on the floor, put O2 on and was alert by the time EMTs were loading her up.   Past Medical History:  Diagnosis Date  . Acute blood loss anemia 10/27/2017  . Acute GI bleeding 10/27/2017  . Anemia   . Arthritis    "joints" (11/10/2017)  . Dizziness   . GERD (gastroesophageal reflux disease)   . GI bleed 11/06/2017  . Hearing loss   . History of blood transfusion 10/2017  . Hyperlipidemia   . Hyperthyroidism   . Melanoma (HCC)    "cut off my back"  . Memory loss   . Sinus headache   . Sleep apnea     Patient Active Problem List   Diagnosis Date Noted  . Paroxysmal A-fib (HCC) 03/24/2020  . Atrial fibrillation with RVR (HCC) 03/20/2020  . Gait  disturbance 01/29/2020  . Periodic limb movement disorder 08/30/2019  . Nocturia 07/01/2018  . Midline cystocele 06/08/2018  . Recurrent UTI 06/08/2018  . Vaginal vault prolapse 06/08/2018  . Lightheadedness 06/02/2018  . Near syncope   . Hypotension 03/17/2018  . Postural dizziness with presyncope 03/17/2018  . PAF (paroxysmal atrial fibrillation) (HCC) 03/17/2018  . Memory loss 11/25/2017  . Acute lower UTI 10/29/2017  . Abnormal stress test   . Chest pain 11/23/2016  . Hyperlipidemia 11/23/2016  . GERD (gastroesophageal reflux disease) 11/23/2016  . History of recurrent UTIs 11/23/2016  . Polyneuropathy 10/30/2016  . Bursitis, subacromial 10/30/2016  . Numbness 10/30/2016  . Arthritis, senescent 01/03/2016  . Left knee pain 01/03/2016  . Pain in joint, ankle and foot 02/28/2015  . Dry mouth 08/07/2014  . Dry eyes 08/07/2014  . Other headache syndrome 06/25/2014  . Sinusitis, chronic 06/25/2014  . Neck pain 06/25/2014  . OSA on CPAP 06/25/2014  . Essential hypertension, benign 06/25/2014    Past Surgical History:  Procedure Laterality Date  . ABDOMINAL HYSTERECTOMY     "partial"  . APPENDECTOMY    . BIOPSY  11/17/2017   Procedure: BIOPSY;  Surgeon: Vida Rigger, MD;  Location: [facility];  Service: Endoscopy;;  . COLONOSCOPY WITH PROPOFOL N/A 11/17/2017   Procedure: COLONOSCOPY WITH PROPOFOL;  Surgeon: Vida Rigger, MD;  Location: [facility];  Service: Endoscopy;  Laterality: N/A;  . CRANIECTOMY FOR EXCISION OF ACOUSTIC NEUROMA    . ESOPHAGOGASTRODUODENOSCOPY (EGD) WITH PROPOFOL N/A 10/28/2017   Procedure: ESOPHAGOGASTRODUODENOSCOPY (EGD) WITH PROPOFOL;  Surgeon: Clarene Essex, MD;  Location: [facility];  Service: Endoscopy;  Laterality: N/A;  . KNEE ARTHROSCOPY Left   . LEFT HEART CATH AND CORONARY ANGIOGRAPHY N/A 11/24/2016   Procedure: LEFT HEART CATH AND CORONARY ANGIOGRAPHY;  Surgeon: Jettie Booze, MD;  Location: [facility];  Service: Cardiovascular;   Laterality: N/A;  . MELANOMA EXCISION     "off my back"  . NASAL SINUS SURGERY    . TONSILLECTOMY       OB History   No obstetric history on file.     Family History  Problem Relation Age of Onset  . Congestive Heart Failure Mother   . Heart disease Father        History of heart attacks at a later age.    Marland Kitchen Heart disease Brother   . Hearing loss Brother     Social History   Tobacco Use  . Smoking status: Former Smoker    Years: 2.00    Types: Cigarettes    Quit date: 1980    Years since quitting: 42.0  . Smokeless tobacco: Never Used  . Tobacco comment: 11/10/2017 "only smoked when I was on my period"  Vaping Use  . Vaping Use: Never used  Substance Use Topics  . Alcohol use: Yes    Comment: 11/10/2017 "nothing lately; did have 1 glass of wine q night"  . Drug use: Never    Home Medications Prior to Admission medications   Medication Sig Start Date End Date Taking? Authorizing Provider  acetaminophen (TYLENOL) 500 MG tablet Take 500 mg by mouth 2 (two) times daily.   Yes [provider]  Alpha-Lipoic Acid (LIPOIC ACID PO) Take 600 mg by mouth 2 (two) times daily.   Yes [provider]  atorvastatin (LIPITOR) 10 MG tablet TAKE 1 TABLET BY MOUTH EVERY DAY Patient taking differently: Take 10 mg by mouth at bedtime. 04/25/19  Yes Adrian Prows, MD  Calcium Carbonate-Vitamin D3 600-400 MG-UNIT TABS Take 1 tablet by mouth 2 (two) times a day.    Yes [provider]  cholecalciferol (VITAMIN D) 1000 UNITS tablet Take 1,000 Units by mouth daily.   Yes [provider]  cycloSPORINE (RESTASIS) 0.05 % ophthalmic emulsion Place 1 drop into both eyes 2 (two) times daily.    Yes [provider]  diltiazem (CARDIZEM CD) 120 MG 24 hr capsule TAKE 1 CAPSULE BY MOUTH EVERY DAY Patient taking differently: Take 120 mg by mouth daily. 02/22/20  Yes Adrian Prows, MD  DULoxetine (CYMBALTA) 30 MG capsule TAKE 1 CAPSULE BY MOUTH TWICE A DAY Patient  taking differently: Take 30 mg by mouth 2 (two) times daily. 12/12/19  Yes Sater, Nanine Means, MD  ELIQUIS 5 MG TABS tablet TAKE 1 TABLET BY MOUTH TWICE A DAY Patient taking differently: Take 5 mg by mouth 2 (two) times daily. 03/11/20  Yes Adrian Prows, MD  estradiol (CLIMARA - DOSED IN MG/24 HR) 0.025 mg/24hr patch Place 0.025 mg onto the skin every Sunday.   Yes [provider]  feeding supplement (BOOST / RESOURCE BREEZE) LIQD Take 237 mLs by mouth in the morning and at bedtime.   Yes [provider]  fluticasone (FLONASE) 50 MCG/ACT nasal spray Place 1 spray into the nose daily as needed for allergies.    Yes  [provider]  GLUCOSAMINE-CHONDROITIN PO Take 2 tablets by mouth daily at 12 noon.   Yes [provider]  ibuprofen (ADVIL) 200 MG tablet Take 2 tablets (400 mg total) by mouth 2 (two) times daily as needed. Patient taking differently: Take 400 mg by mouth 2 (two) times daily as needed for mild pain. 03/26/20  Yes Alma Friendly, MD  meclizine (ANTIVERT) 25 MG tablet Take 25 mg by mouth daily as needed for dizziness.   Yes [provider]  Melatonin 5 MG CAPS Take 5 mg by mouth at bedtime.    Yes [provider]  Multiple Vitamin (MULTIVITAMIN WITH MINERALS) TABS tablet Take 1 tablet by mouth daily.   Yes [provider]  nitroGLYCERIN (NITROLINGUAL) 0.4 MG/SPRAY spray PLACE 1 SPRAY UNDER THE TONGUE EVERY 5 (FIVE) MINUTES FOR 3 DOSES AS NEEDED FOR CHEST PAIN Patient taking differently: Place 1 spray under the tongue every 5 (five) minutes x 3 doses as needed for chest pain. 03/27/20  Yes Adrian Prows, MD  Omega-3 1000 MG CAPS Take 2,000 mg by mouth daily.    Yes [provider]  pantoprazole (PROTONIX) 40 MG tablet Take 40 mg by mouth daily before breakfast.  06/12/14  Yes [provider]  polyethylene glycol (MIRALAX / GLYCOLAX) 17 g packet Take 17 g by mouth daily as needed for mild constipation. 03/26/20   Yes Alma Friendly, MD  QUEtiapine (SEROQUEL) 25 MG tablet Take 1 tablet (25 mg total) by mouth at bedtime. 03/26/20  Yes Alma Friendly, MD  thyroid (ARMOUR) 60 MG tablet Take 60 mg by mouth daily before breakfast. 11/07/17  Yes [provider]  tiZANidine (ZANAFLEX) 4 MG tablet Take 1 tablet (4 mg total) by mouth 3 (three) times daily as needed. Patient taking differently: Take 4 mg by mouth 3 (three) times daily as needed for muscle spasms. 02/09/19  Yes Sater, Nanine Means, MD  vitamin C (ASCORBIC ACID) 500 MG tablet Take 500 mg by mouth daily.    Yes [provider]  nitrofurantoin (MACRODANTIN) 100 MG capsule Take 100 mg by mouth daily.  Patient not taking: Reported on 04/10/2020 02/08/20   [provider]    Allergies    Avelox [moxifloxacin hcl in nacl], Other, and Alendronate sodium  Review of Systems   Review of Systems  Constitutional: Negative for fever.  HENT: Negative for sore throat.   Eyes: Negative for visual disturbance.  Respiratory: Negative for cough and shortness of breath.   Cardiovascular: Negative for chest pain.  Gastrointestinal: Negative for abdominal pain, blood in stool, diarrhea, nausea and vomiting.  Genitourinary: Negative for difficulty urinating. Frequency: no change, being evaluated for NPH.  Musculoskeletal: Negative for back pain.  Skin: Negative for rash.  Neurological: Positive for syncope. Negative for speech difficulty, weakness, numbness and headaches.    Physical Exam Updated Vital Signs BP (!) 168/79   Pulse (!) 44   Temp 97.9 F (36.6 C) (Oral)   Resp (!) 21   SpO2 95%   Physical Exam Vitals and nursing note reviewed.  Constitutional:      General: She is not in acute distress.    Appearance: She is well-developed and well-nourished. She is not diaphoretic.  HENT:     Head: Normocephalic and atraumatic.  Eyes:     Extraocular Movements: EOM normal.     Conjunctiva/sclera: Conjunctivae normal.   Cardiovascular:     Rate and Rhythm: Normal rate and regular rhythm.  Pulses: Intact distal pulses.     Heart sounds: Normal heart sounds. No murmur heard. No friction rub. No gallop.   Pulmonary:     Effort: Pulmonary effort is normal. No respiratory distress.     Breath sounds: Normal breath sounds. No wheezing or rales.  Abdominal:     General: There is no distension.     Palpations: Abdomen is soft.     Tenderness: There is no abdominal tenderness. There is no guarding.  Musculoskeletal:        General: No tenderness or edema.     Cervical back: Normal range of motion.  Skin:    General: Skin is warm and dry.     Findings: No erythema or rash.  Neurological:     Mental Status: She is alert and oriented to person, place, and time.     ED Results / Procedures / Treatments   Labs (all labs ordered are listed, but only abnormal results are displayed) Labs Reviewed  CBC WITH DIFFERENTIAL/PLATELET - Abnormal; Notable for the following components:      Result Value   RBC 3.73 (*)    Hemoglobin 11.8 (*)    HCT 35.7 (*)    All other components within normal limits  COMPREHENSIVE METABOLIC PANEL - Abnormal; Notable for the following components:   Potassium 3.2 (*)    Glucose, Bld 105 (*)    Total Protein 6.1 (*)    Albumin 3.4 (*)    All other components within normal limits  URINALYSIS, ROUTINE W REFLEX MICROSCOPIC - Abnormal; Notable for the following components:   APPearance CLOUDY (*)    All other components within normal limits  URINE CULTURE    EKG EKG Interpretation  Date/Time:  Wednesday April 10 2020 09:10:03 EST Ventricular Rate:  82 PR Interval:    QRS Duration: 107 QT Interval:  418 QTC Calculation: 489 R Axis:   -49 Text Interpretation: Sinus rhythm LAD, consider left anterior fascicular block Borderline prolonged QT interval Since prior ECG, she is now in sinus rhythm and rate has slowed Confirmed by Gareth Morgan [phone]) on 04/10/2020  11:14:28 AM   Radiology DG Chest Portable 1 View  Result Date: 04/10/2020 CLINICAL DATA:  Syncope EXAM: PORTABLE CHEST 1 VIEW COMPARISON:  March 23, 2020 FINDINGS: Apparent mild scarring in the right upper lobe. No edema or airspace opacity. Heart size and pulmonary vascularity normal. There is aortic atherosclerosis. No adenopathy. There is upper thoracic levoscoliosis. IMPRESSION: Mild scarring right upper lobe. No edema or consolidation. Stable cardiac silhouette. Aortic Atherosclerosis (ICD10-I70.0). Electronically Signed   By: Lowella Grip III M.D.   On: 04/10/2020 11:43    Procedures Procedures (including critical care time)  Medications Ordered in ED Medications  sodium chloride 0.9 % bolus 500 mL (0 mLs Intravenous Stopped 04/10/20 1249)  potassium chloride SA (KLOR-CON) CR tablet 40 mEq (40 mEq Oral Given 04/10/20 1246)    ED Course  I have reviewed the triage vital signs and the nursing notes.  Pertinent labs & imaging results that were available during my care of the patient were reviewed by me and considered in my medical decision making (see chart for details).    MDM Rules/Calculators/A&P                          79yo female hypertension, hyperlipidemia, atrial fibrillation on cardizem and eliquis, bilateral midfoot exostectomy 11/22, OSA, mild dementia, dizziness/gait abnormality being worked up for NPH, seeing Dr.  Ganji for syncopal episodes as well as atrial fibrillation presents with a syncopal episode.  With EMS, she was in atrial fibrillation with RVR however converted to NSR on arrival to the ED. DDx for syncope includes cardiac arrhythmia, MI, PE, electrolyte abnormality, hypovolemia including dehydration and anemia/GI bleed, infection.  No focal neurologic concerns on history or exam to suggest CVA, ICH or other central etiology.  Chest x-ray shows no acute findings. CBC showed no sign of significant anemia.  Electrolytes show mild hypokalemia, given K.   EKG shows sinus rhythm at this time, she does not have chest pain and have low suspicion for ACS.  Doubt PE as she is on anticoagulation. Symptoms not consistent with dissection, AAA, sepsis.    Discussed with Dr. Einar Gip, who reports she has had syncopal episodes in setting of UTIs in the past and recommends evaluation for this, orthostatics and close outpatient follow up and possible outpatient loop recorder placement.  Feels she is appropriate for outpatient follow up.  Urinalysis shows no sign of UTI. Given fluids for possible dehydration. Asymptomatic at this time.  Recommend close follow up with Dr. Einar Gip, likely loop recorder placement. Patient discharged in stable condition with understanding of reasons to return.       Final Clinical Impression(s) / ED Diagnoses Final diagnoses:  Syncope, unspecified syncope type  Atrial fibrillation with RVR [facility])    Rx / DC Orders ED Discharge Orders    None       Gareth Morgan, MD 04/10/20 2137

## 2020-04-11 LAB — URINE CULTURE: Culture: NO GROWTH

## 2020-04-12 DIAGNOSIS — G319 Degenerative disease of nervous system, unspecified: Secondary | ICD-10-CM | POA: Diagnosis not present

## 2020-04-12 DIAGNOSIS — G9341 Metabolic encephalopathy: Secondary | ICD-10-CM | POA: Diagnosis not present

## 2020-04-12 DIAGNOSIS — I10 Essential (primary) hypertension: Secondary | ICD-10-CM | POA: Diagnosis not present

## 2020-04-12 DIAGNOSIS — G4489 Other headache syndrome: Secondary | ICD-10-CM | POA: Diagnosis not present

## 2020-04-12 DIAGNOSIS — G4761 Periodic limb movement disorder: Secondary | ICD-10-CM | POA: Diagnosis not present

## 2020-04-12 DIAGNOSIS — Z4789 Encounter for other orthopedic aftercare: Secondary | ICD-10-CM | POA: Diagnosis not present

## 2020-04-12 DIAGNOSIS — F0280 Dementia in other diseases classified elsewhere without behavioral disturbance: Secondary | ICD-10-CM | POA: Diagnosis not present

## 2020-04-12 DIAGNOSIS — I480 Paroxysmal atrial fibrillation: Secondary | ICD-10-CM | POA: Diagnosis not present

## 2020-04-12 DIAGNOSIS — I700 Atherosclerosis of aorta: Secondary | ICD-10-CM | POA: Diagnosis not present

## 2020-04-15 DIAGNOSIS — I10 Essential (primary) hypertension: Secondary | ICD-10-CM | POA: Diagnosis not present

## 2020-04-15 DIAGNOSIS — R600 Localized edema: Secondary | ICD-10-CM | POA: Diagnosis not present

## 2020-04-16 DIAGNOSIS — I480 Paroxysmal atrial fibrillation: Secondary | ICD-10-CM | POA: Diagnosis not present

## 2020-04-16 DIAGNOSIS — G4489 Other headache syndrome: Secondary | ICD-10-CM | POA: Diagnosis not present

## 2020-04-16 DIAGNOSIS — I10 Essential (primary) hypertension: Secondary | ICD-10-CM | POA: Diagnosis not present

## 2020-04-16 DIAGNOSIS — I700 Atherosclerosis of aorta: Secondary | ICD-10-CM | POA: Diagnosis not present

## 2020-04-16 DIAGNOSIS — G319 Degenerative disease of nervous system, unspecified: Secondary | ICD-10-CM | POA: Diagnosis not present

## 2020-04-16 DIAGNOSIS — Z4789 Encounter for other orthopedic aftercare: Secondary | ICD-10-CM | POA: Diagnosis not present

## 2020-04-16 DIAGNOSIS — G4761 Periodic limb movement disorder: Secondary | ICD-10-CM | POA: Diagnosis not present

## 2020-04-16 DIAGNOSIS — G9341 Metabolic encephalopathy: Secondary | ICD-10-CM | POA: Diagnosis not present

## 2020-04-16 DIAGNOSIS — F0280 Dementia in other diseases classified elsewhere without behavioral disturbance: Secondary | ICD-10-CM | POA: Diagnosis not present

## 2020-04-17 ENCOUNTER — Ambulatory Visit (INDEPENDENT_AMBULATORY_CARE_PROVIDER_SITE_OTHER): Payer: Medicare PPO | Admitting: Podiatry

## 2020-04-17 ENCOUNTER — Other Ambulatory Visit: Payer: Self-pay

## 2020-04-17 DIAGNOSIS — I480 Paroxysmal atrial fibrillation: Secondary | ICD-10-CM | POA: Diagnosis not present

## 2020-04-17 DIAGNOSIS — I700 Atherosclerosis of aorta: Secondary | ICD-10-CM | POA: Diagnosis not present

## 2020-04-17 DIAGNOSIS — M898X7 Other specified disorders of bone, ankle and foot: Secondary | ICD-10-CM

## 2020-04-17 DIAGNOSIS — Z4789 Encounter for other orthopedic aftercare: Secondary | ICD-10-CM | POA: Diagnosis not present

## 2020-04-17 DIAGNOSIS — G4489 Other headache syndrome: Secondary | ICD-10-CM | POA: Diagnosis not present

## 2020-04-17 DIAGNOSIS — I10 Essential (primary) hypertension: Secondary | ICD-10-CM | POA: Diagnosis not present

## 2020-04-17 DIAGNOSIS — F0280 Dementia in other diseases classified elsewhere without behavioral disturbance: Secondary | ICD-10-CM | POA: Diagnosis not present

## 2020-04-17 DIAGNOSIS — G4761 Periodic limb movement disorder: Secondary | ICD-10-CM | POA: Diagnosis not present

## 2020-04-17 DIAGNOSIS — G9341 Metabolic encephalopathy: Secondary | ICD-10-CM | POA: Diagnosis not present

## 2020-04-17 DIAGNOSIS — G319 Degenerative disease of nervous system, unspecified: Secondary | ICD-10-CM | POA: Diagnosis not present

## 2020-04-17 DIAGNOSIS — Z9889 Other specified postprocedural states: Secondary | ICD-10-CM

## 2020-04-18 ENCOUNTER — Encounter: Payer: Self-pay | Admitting: Podiatry

## 2020-04-18 DIAGNOSIS — M810 Age-related osteoporosis without current pathological fracture: Secondary | ICD-10-CM | POA: Diagnosis not present

## 2020-04-18 HISTORY — PX: FOOT SURGERY: SHX648

## 2020-04-18 NOTE — Progress Notes (Signed)
Subjective:  [name], female    DOB: 1940/11/26,  MRN: [id number]  Chief Complaint  Patient presents with  . Routine Post Op    Pt stated that she is doing okay she has a little bit of pain here and there.    DOS: 03/04/2020 Procedure: Bilateral midfoot exostectomy  80 y.o. female returns for post-op check.  Patient is doing well.  Patient was recently admitted to the hospital for A. fib.  Patient states she is doing good overall.  She is at a nursing facility.  She denies any other acute complaints.  To be doing regular dressing changes.  Review of Systems: Negative except as noted in the HPI. Denies N/V/F/Ch.  Past Medical History:  Diagnosis Date  . Acute blood loss anemia 10/27/2017  . Acute GI bleeding 10/27/2017  . Anemia   . Arthritis    "joints" (11/10/2017)  . Dizziness   . GERD (gastroesophageal reflux disease)   . GI bleed 11/06/2017  . Hearing loss   . History of blood transfusion 10/2017  . Hyperlipidemia   . Hyperthyroidism   . Melanoma ([place])    "cut off my back"  . Memory loss   . Sinus headache   . Sleep apnea     Current Outpatient Medications:  .  acetaminophen (TYLENOL) 500 MG tablet, Take 500 mg by mouth 2 (two) times daily., Disp: , Rfl:  .  Alpha-Lipoic Acid (LIPOIC ACID PO), Take 600 mg by mouth 2 (two) times daily., Disp: , Rfl:  .  atorvastatin (LIPITOR) 10 MG tablet, TAKE 1 TABLET BY MOUTH EVERY DAY (Patient taking differently: Take 10 mg by mouth at bedtime.), Disp: 90 tablet, Rfl: 3 .  Calcium Carbonate-Vitamin D3 600-400 MG-UNIT TABS, Take 1 tablet by mouth 2 (two) times a day. , Disp: , Rfl:  .  cholecalciferol (VITAMIN D) 1000 UNITS tablet, Take 1,000 Units by mouth daily., Disp: , Rfl:  .  cycloSPORINE (RESTASIS) 0.05 % ophthalmic emulsion, Place 1 drop into both eyes 2 (two) times daily. , Disp: , Rfl:  .  diltiazem (CARDIZEM CD) 120 MG 24 hr capsule, TAKE 1 CAPSULE BY MOUTH EVERY DAY (Patient taking differently: Take 120 mg  by mouth daily.), Disp: 90 capsule, Rfl: 1 .  DULoxetine (CYMBALTA) 30 MG capsule, TAKE 1 CAPSULE BY MOUTH TWICE A DAY (Patient taking differently: Take 30 mg by mouth 2 (two) times daily.), Disp: 180 capsule, Rfl: 3 .  ELIQUIS 5 MG TABS tablet, TAKE 1 TABLET BY MOUTH TWICE A DAY (Patient taking differently: Take 5 mg by mouth 2 (two) times daily.), Disp: 60 tablet, Rfl: 5 .  estradiol (CLIMARA - DOSED IN MG/24 HR) 0.025 mg/24hr patch, Place 0.025 mg onto the skin every Sunday., Disp: , Rfl:  .  feeding supplement (BOOST / RESOURCE BREEZE) LIQD, Take 237 mLs by mouth in the morning and at bedtime., Disp: , Rfl:  .  fluticasone (FLONASE) 50 MCG/ACT nasal spray, Place 1 spray into the nose daily as needed for allergies. , Disp: , Rfl:  .  GLUCOSAMINE-CHONDROITIN PO, Take 2 tablets by mouth daily at 12 noon., Disp: , Rfl:  .  ibuprofen (ADVIL) 200 MG tablet, Take 2 tablets (400 mg total) by mouth 2 (two) times daily as needed. (Patient taking differently: Take 400 mg by mouth 2 (two) times daily as needed for mild pain.), Disp: 30 tablet, Rfl: 0 .  meclizine (ANTIVERT) 25 MG tablet, Take 25 mg by mouth daily as needed  for dizziness., Disp: , Rfl:  .  Melatonin 5 MG CAPS, Take 5 mg by mouth at bedtime. , Disp: , Rfl:  .  Multiple Vitamin (MULTIVITAMIN WITH MINERALS) TABS tablet, Take 1 tablet by mouth daily., Disp: , Rfl:  .  nitrofurantoin (MACRODANTIN) 100 MG capsule, Take 100 mg by mouth daily.  (Patient not taking: Reported on 04/10/2020), Disp: , Rfl:  .  nitroGLYCERIN (NITROLINGUAL) 0.4 MG/SPRAY spray, PLACE 1 SPRAY UNDER THE TONGUE EVERY 5 (FIVE) MINUTES FOR 3 DOSES AS NEEDED FOR CHEST PAIN (Patient taking differently: Place 1 spray under the tongue every 5 (five) minutes x 3 doses as needed for chest pain.), Disp: 12 g, Rfl: 1 .  Omega-3 1000 MG CAPS, Take 2,000 mg by mouth daily. , Disp: , Rfl:  .  pantoprazole (PROTONIX) 40 MG tablet, Take 40 mg by mouth daily before breakfast. , Disp: , Rfl:  1 .  polyethylene glycol (MIRALAX / GLYCOLAX) 17 g packet, Take 17 g by mouth daily as needed for mild constipation., Disp: 14 each, Rfl: 0 .  QUEtiapine (SEROQUEL) 25 MG tablet, Take 1 tablet (25 mg total) by mouth at bedtime., Disp: , Rfl:  .  thyroid (ARMOUR) 60 MG tablet, Take 60 mg by mouth daily before breakfast., Disp: , Rfl:  .  tiZANidine (ZANAFLEX) 4 MG tablet, Take 1 tablet (4 mg total) by mouth 3 (three) times daily as needed. (Patient taking differently: Take 4 mg by mouth 3 (three) times daily as needed for muscle spasms.), Disp: 90 tablet, Rfl: 5 .  vitamin C (ASCORBIC ACID) 500 MG tablet, Take 500 mg by mouth daily. , Disp: , Rfl:   Social History   Tobacco Use  Smoking Status Former Smoker  . Years: 2.00  . Types: Cigarettes  . Quit date: 24  . Years since quitting: 42.0  Smokeless Tobacco Never Used  Tobacco Comment   11/10/2017 "only smoked when I was on my period"    Allergies  Allergen Reactions  . Avelox [Moxifloxacin Hcl In Nacl] Other (See Comments)    Unknown reaction  . Other Other (See Comments)    Other reaction(s): flu like symptoms  . Alendronate Sodium Nausea Only   Objective:  There were no vitals filed for this visit. There is no height or weight on file to calculate BMI. Constitutional Well developed. Well nourished.  Vascular Foot warm and well perfused. Capillary refill normal to all digits.   Neurologic Normal speech. Oriented to person, place, and time. Epicritic sensation to light touch grossly present bilaterally.  Dermatologic Skin healing well without signs of infection. Skin edges well coapted without signs of infection.  Orthopedic:  No tenderness to palpation noted about the surgical site.   Radiographs: Adequate resection noted.  3 views of skeletally mature adult bilateral foot: Adequate resection good correction alignment noted.  Of pes planovalgus foot deformity noted. Assessment:   1. Post-operative state   2. Exostosis  of right foot   3. Exostosis of left foot    Plan:  Patient was evaluated and treated and all questions answered.  S/p foot surgery bilaterally -Progressing as expected post-operatively. -XR: See above -WB Status: Weightbearing as tolerated in surgical shoe -Sutures: Removed.  No dehiscence dehiscence noted to the both sides.  No clinical signs of infection noted. -Medications: None -Foot redressed with Betadine wet-to-dry dressing changes.  I have asked the nursing facility to do Betadine wet-to-dry dressing changes every other day.  No follow-ups on file.

## 2020-04-19 ENCOUNTER — Ambulatory Visit: Payer: Medicare PPO | Admitting: Student

## 2020-04-19 ENCOUNTER — Encounter: Payer: Self-pay | Admitting: Student

## 2020-04-19 ENCOUNTER — Other Ambulatory Visit: Payer: Self-pay

## 2020-04-19 VITALS — BP 147/83 | HR 89 | Resp 16 | Ht 65.0 in

## 2020-04-19 DIAGNOSIS — R55 Syncope and collapse: Secondary | ICD-10-CM | POA: Diagnosis not present

## 2020-04-19 DIAGNOSIS — G9341 Metabolic encephalopathy: Secondary | ICD-10-CM | POA: Diagnosis not present

## 2020-04-19 DIAGNOSIS — I10 Essential (primary) hypertension: Secondary | ICD-10-CM | POA: Diagnosis not present

## 2020-04-19 DIAGNOSIS — G4489 Other headache syndrome: Secondary | ICD-10-CM | POA: Diagnosis not present

## 2020-04-19 DIAGNOSIS — I480 Paroxysmal atrial fibrillation: Secondary | ICD-10-CM | POA: Diagnosis not present

## 2020-04-19 DIAGNOSIS — F0280 Dementia in other diseases classified elsewhere without behavioral disturbance: Secondary | ICD-10-CM | POA: Diagnosis not present

## 2020-04-19 DIAGNOSIS — Z4789 Encounter for other orthopedic aftercare: Secondary | ICD-10-CM | POA: Diagnosis not present

## 2020-04-19 DIAGNOSIS — G4761 Periodic limb movement disorder: Secondary | ICD-10-CM | POA: Diagnosis not present

## 2020-04-19 DIAGNOSIS — I700 Atherosclerosis of aorta: Secondary | ICD-10-CM | POA: Diagnosis not present

## 2020-04-19 DIAGNOSIS — G319 Degenerative disease of nervous system, unspecified: Secondary | ICD-10-CM | POA: Diagnosis not present

## 2020-04-19 NOTE — Progress Notes (Signed)
Primary Physician/Referring:  Merri Brunette, MD  [name], female    DOB: 03-Apr-1941, 80 y.o.   MRN: [id number]  Chief Complaint  Patient presents with  . Loss of Consciousness   HPI:    Cynthia [name]  is a 80 y.o. Caucasian female with hypertension, hyperlipidemia, frequent UTI,  paroxysmal atrial fibrillation, atrial tachycardia, obstructive sleep apnea on CPAP. Tolerating anticoagulation well. Flecainide discontinued due to dizziness. In the past she has had recurrent episodes of near syncope and severe orthostatic changes with recurrent UTI.    Patient underwent surgery to her foot on 03/04/2020, for which she held Eliquis. Patient has since resumed Eliquis. Following foot surgery patient was staying at rehabilitation facility.  Patient was then hospitalized 03/20/2020-03/26/2020 with toxic metabolic encephalopathy and atrial fibrillation with RVR. She was discharged back to rehabilitation facility, where on 04/10/2020 she had a syncopal episode and presented again to [facility] emergency department. Patient has no recollection of the syncopal events or the events prior to of following syncope. History is provided third hand by her husband has he did not witness the event. According to nursing staff patient was using the restroom and suddenly, without warning, lost consciousness. It is unclear if patient was walking or on the commode. It is unclear how long patient was unconscious and it is unclear if patient experienced prodromal symptoms. EMS was activated who noted patient to be in atrial fibrillation with RVR, spontaneously converted to normal sinus rhythm prior to arrival at the emergency department. Work up in the emergency department was negative for UTI, which has caused syncope in the past.   Patient's husband notes that patient drinks Pedialyte on a daily basis when at home but was not drinking at the time of syncopal episode.   Patient reports that since evaluation  in the emergency department she has had no recurrence of syncope or near syncope.  She had noticed that her blood pressure was elevated over the last 1 week, and therefore called her PCP who started her on losartan 100 mg daily and hydrochlorothiazide 25 mg daily approximately 5 days ago. Denies chest pain, palpitations, dizziness, fatigue, dyspnea, leg swelling. She does continue to have constant pain in her right foot since surgery.    Past Medical History:  Diagnosis Date  . Acute blood loss anemia 10/27/2017  . Acute GI bleeding 10/27/2017  . Anemia   . Arthritis    "joints" (11/10/2017)  . Dizziness   . GERD (gastroesophageal reflux disease)   . GI bleed 11/06/2017  . Hearing loss   . History of blood transfusion 10/2017  . Hyperlipidemia   . Hyperthyroidism   . Melanoma (HCC)    "cut off my back"  . Memory loss   . Sinus headache   . Sleep apnea    Past Surgical History:  Procedure Laterality Date  . ABDOMINAL HYSTERECTOMY     "partial"  . APPENDECTOMY    . BIOPSY  11/17/2017   Procedure: BIOPSY;  Surgeon: Vida Rigger, MD;  Location: [facility];  Service: Endoscopy;;  . COLONOSCOPY WITH PROPOFOL N/A 11/17/2017   Procedure: COLONOSCOPY WITH PROPOFOL;  Surgeon: Vida Rigger, MD;  Location: [facility];  Service: Endoscopy;  Laterality: N/A;  . CRANIECTOMY FOR EXCISION OF ACOUSTIC NEUROMA    . ESOPHAGOGASTRODUODENOSCOPY (EGD) WITH PROPOFOL N/A 10/28/2017   Procedure: ESOPHAGOGASTRODUODENOSCOPY (EGD) WITH PROPOFOL;  Surgeon: Vida Rigger, MD;  Location: [facility];  Service: Endoscopy;  Laterality: N/A;  . KNEE ARTHROSCOPY Left   .  LEFT HEART CATH AND CORONARY ANGIOGRAPHY N/A 11/24/2016   Procedure: LEFT HEART CATH AND CORONARY ANGIOGRAPHY;  Surgeon: Corky Crafts, MD;  Location: [facility] INVASIVE CV LAB;  Service: Cardiovascular;  Laterality: N/A;  . MELANOMA EXCISION     "off my back"  . NASAL SINUS SURGERY    . TONSILLECTOMY     Family History  Problem Relation Age of Onset   . Congestive Heart Failure Mother   . Heart disease Father        History of heart attacks at a later age.    Marland Kitchen Heart disease Brother   . Hearing loss Brother     Social History   Tobacco Use  . Smoking status: Former Smoker    Years: 2.00    Types: Cigarettes    Quit date: 1980    Years since quitting: 42.0  . Smokeless tobacco: Never Used  . Tobacco comment: 11/10/2017 "only smoked when I was on my period"  Substance Use Topics  . Alcohol use: Yes    Comment: 11/10/2017 "nothing lately; did have 1 glass of wine q night"  Marital Status: Married   ROS  Review of Systems  Constitutional: Negative for malaise/fatigue and weight gain.  Cardiovascular: Negative for chest pain, claudication, dyspnea on exertion, leg swelling, near-syncope, orthopnea, palpitations, paroxysmal nocturnal dyspnea and syncope.  Respiratory: Negative for shortness of breath.   Hematologic/Lymphatic: Does not bruise/bleed easily.  Gastrointestinal: Negative for melena.  Neurological: Positive for dizziness and light-headedness. Negative for weakness.   Objective  Blood pressure (!) 147/83, pulse 89, resp. rate 16, height 5\' 5"  (1.651 m), SpO2 90 %.  Vitals with BMI 04/19/2020 04/10/2020 04/10/2020  Height 5\' 5"  - -  Weight - - -  BMI - - -  Systolic 147 168 960  Diastolic 83 79 84  Pulse 89 44 78    Orthostatic VS for the past 72 hrs (Last 3 readings):  Orthostatic BP Patient Position BP Location Cuff Size Orthostatic Pulse  04/19/20 0925 144/71 Standing Left Arm Normal 55  04/19/20 0924 119/68 Sitting Left Arm Normal 85  04/19/20 0923 135/70 Supine Left Arm Normal 79     Physical Exam Cardiovascular:     Rate and Rhythm: Normal rate and regular rhythm.     Pulses: Intact distal pulses.     Heart sounds: Normal heart sounds. No murmur heard. No gallop.      Comments: No leg edema, no JVD. Pulmonary:     Effort: Pulmonary effort is normal.     Breath sounds: Normal breath sounds.  Abdominal:      General: Bowel sounds are normal.     Palpations: Abdomen is soft.    Laboratory examination:   Recent Labs    06/30/19 1740 01/24/20 1159 03/25/20 1308 03/26/20 0659 04/10/20 1034  NA 136   < > 129* 132* 144  K 3.2*   < > 3.3* 3.4* 3.2*  CL 101   < > 93* 97* 107  CO2 25   < > 24 22 27   GLUCOSE 118*   < > 128* 114* 105*  BUN 16   < > 16 11 19   CREATININE 0.67   < > 0.68 0.57 0.66  CALCIUM 8.9   < > 9.1 9.9 9.3  GFRNONAA >60   < > >60 >60 >60  GFRAA >60  --   --   --   --    < > = values in this interval not displayed.  CrCl cannot be calculated (Unknown ideal weight.).  CMP Latest Ref Rng & Units 04/10/2020 03/26/2020 03/25/2020  Glucose 70 - 99 mg/dL 161(W) 960(A) 540(J)  BUN 8 - 23 mg/dL 19 11 16   Creatinine 0.44 - 1.00 mg/dL 8.11 9.14 7.82  Sodium 135 - 145 mmol/L 144 132(L) 129(L)  Potassium 3.5 - 5.1 mmol/L 3.2(L) 3.4(L) 3.3(L)  Chloride 98 - 111 mmol/L 107 97(L) 93(L)  CO2 22 - 32 mmol/L 27 22 24   Calcium 8.9 - 10.3 mg/dL 9.3 9.9 9.1  Total Protein 6.5 - 8.1 g/dL 6.1(L) - -  Total Bilirubin 0.3 - 1.2 mg/dL 0.8 - -  Alkaline Phos 38 - 126 U/L 47 - -  AST 15 - 41 U/L 20 - -  ALT 0 - 44 U/L 19 - -   CBC Latest Ref Rng & Units 04/10/2020 03/25/2020 03/24/2020  WBC 4.0 - 10.5 K/uL 6.2 8.2 8.8  Hemoglobin 12.0 - 15.0 g/dL 11.8(L) 12.6 12.4  Hematocrit 36.0 - 46.0 % 35.7(L) 34.8(L) 35.8(L)  Platelets 150 - 400 K/uL 280 245 278   Lipid Panel     Component Value Date/Time   CHOL 165 11/24/2016 0040   TRIG 88 11/24/2016 0040   HDL 61 11/24/2016 0040   CHOLHDL 2.7 11/24/2016 0040   VLDL 18 11/24/2016 0040   LDLCALC 86 11/24/2016 0040   HEMOGLOBIN A1C Lab Results  Component Value Date   HGBA1C 5.6 11/24/2016   MPG 114 11/24/2016   TSH Recent Labs    03/21/20 1219  TSH 0.974    External labs:   Cholesterol, total 145.000 m 09/01/2019 HDL 67.000 mg 10/13/2019 LDL-C 94.000 cal 10/13/2019 Triglycerides 80.000 mg 10/13/2019  Hemoglobin 10.900 g/d  01/24/2020 INR 1.160 11/06/2017 Platelets 197.000 K/ 01/24/2020  Creatinine, Serum 0.580 mg/ 01/24/2020 Potassium 3.500 mm 01/24/2020 ALT (SGPT) 15.000 IU/ 09/01/2019  TSH 0.870 10/13/2019  Hemoglobin 9.300 12/14/2018;  INR 1.160 11/06/2017 Platelets 188.000 12/14/2018  Creatinine, Serum 0.970 12/14/2018 Potassium 4.000 12/14/2018 Magnesium N/D ALT (SGPT) 15.000 12/14/2018  Labs 11/02/2018: BUN 26, creatinine 0.9, EGFR greater than 60 mL, Hb 10.6/HCT 32.2, platelets 244.   Medications and allergies   Allergies  Allergen Reactions  . Avelox [Moxifloxacin Hcl In Nacl] Other (See Comments)    Unknown reaction  . Other Other (See Comments)    Other reaction(s): flu like symptoms  . Alendronate Sodium Nausea Only    Current Outpatient Medications on File Prior to Visit  Medication Sig Dispense Refill  . acetaminophen (TYLENOL) 500 MG tablet Take 500 mg by mouth 2 (two) times daily.    . Alpha-Lipoic Acid (LIPOIC ACID PO) Take 600 mg by mouth 2 (two) times daily.    Marland Kitchen atorvastatin (LIPITOR) 10 MG tablet TAKE 1 TABLET BY MOUTH EVERY DAY (Patient taking differently: Take 10 mg by mouth at bedtime.) 90 tablet 3  . Calcium Carbonate-Vitamin D3 600-400 MG-UNIT TABS Take 1 tablet by mouth 2 (two) times a day.     . cholecalciferol (VITAMIN D) 1000 UNITS tablet Take 1,000 Units by mouth daily.    . cycloSPORINE (RESTASIS) 0.05 % ophthalmic emulsion Place 1 drop into both eyes 2 (two) times daily.     Marland Kitchen diltiazem (CARDIZEM CD) 120 MG 24 hr capsule TAKE 1 CAPSULE BY MOUTH EVERY DAY (Patient taking differently: Take 120 mg by mouth daily.) 90 capsule 1  . DULoxetine (CYMBALTA) 30 MG capsule TAKE 1 CAPSULE BY MOUTH TWICE A DAY (Patient taking differently: Take 30 mg by mouth 2 (two) times  daily.) 180 capsule 3  . ELIQUIS 5 MG TABS tablet TAKE 1 TABLET BY MOUTH TWICE A DAY (Patient taking differently: Take 5 mg by mouth 2 (two) times daily.) 60 tablet 5  . estradiol (CLIMARA - DOSED IN MG/24 HR) 0.025 mg/24hr  patch Place 0.025 mg onto the skin every Sunday.    . fluticasone (FLONASE) 50 MCG/ACT nasal spray Place 1 spray into the nose daily as needed for allergies.     Marland Kitchen GLUCOSAMINE-CHONDROITIN PO Take 2 tablets by mouth daily at 12 noon.    . hydrochlorothiazide (HYDRODIURIL) 25 MG tablet Take 25 mg by mouth daily.    Marland Kitchen ibuprofen (ADVIL) 200 MG tablet Take 2 tablets (400 mg total) by mouth 2 (two) times daily as needed. (Patient taking differently: Take 400 mg by mouth 2 (two) times daily as needed for mild pain.) 30 tablet 0  . losartan (COZAAR) 100 MG tablet Take 100 mg by mouth daily.    . meclizine (ANTIVERT) 25 MG tablet Take 25 mg by mouth daily as needed for dizziness.    . Melatonin 5 MG CAPS Take 5 mg by mouth at bedtime.     . Multiple Vitamin (MULTIVITAMIN WITH MINERALS) TABS tablet Take 1 tablet by mouth daily.    . nitrofurantoin (MACRODANTIN) 100 MG capsule Take 100 mg by mouth daily.    . nitroGLYCERIN (NITROLINGUAL) 0.4 MG/SPRAY spray PLACE 1 SPRAY UNDER THE TONGUE EVERY 5 (FIVE) MINUTES FOR 3 DOSES AS NEEDED FOR CHEST PAIN (Patient taking differently: Place 1 spray under the tongue every 5 (five) minutes x 3 doses as needed for chest pain.) 12 g 1  . Omega-3 1000 MG CAPS Take 2,000 mg by mouth daily.     . pantoprazole (PROTONIX) 40 MG tablet Take 40 mg by mouth daily before breakfast.   1  . polyethylene glycol (MIRALAX / GLYCOLAX) 17 g packet Take 17 g by mouth daily as needed for mild constipation. 14 each 0  . QUEtiapine (SEROQUEL) 25 MG tablet Take 1 tablet (25 mg total) by mouth at bedtime.    Marland Kitchen thyroid (ARMOUR) 60 MG tablet Take 60 mg by mouth daily before breakfast.    . tiZANidine (ZANAFLEX) 4 MG tablet Take 1 tablet (4 mg total) by mouth 3 (three) times daily as needed. (Patient taking differently: Take 4 mg by mouth 3 (three) times daily as needed for muscle spasms.) 90 tablet 5  . vitamin C (ASCORBIC ACID) 500 MG tablet Take 500 mg by mouth daily.      No current  facility-administered medications on file prior to visit.    Radiology:   No results found.  Cardiac Studies:   Event Monitor for 30 days Start date 07/01/2018 for syncope : A. Fib one episode, no symptoms reported. No heart block or significant bradycardia  Carotid duplex 03/27/2018:  Right Carotid: Velocities in the right ICA are consistent with a 1-39% stenosis. Left Carotid: Velocities in the left ICA are consistent with a 1-39% stenosis. Vertebrals:  Bilateral vertebral arteries demonstrate antegrade flow. Subclavians: Normal flow hemodynamics were seen in bilateral subclavian arteries.   Event Monitor 30 days 11/11/2017: Predominant rhythm is normal sinus rhythm. Symptoms of fatigue reveals normal sinus rhythm. Asymptomatic atrial fibrillation and probable atypical atrial flutter noted on 11/13/2017, 11/18/2017 and 12/09/2017 at 3:00 AM. Occasional PACs. Arrhythmia/PVC burden 6%.   Echo 11/09/2017: Left ventricle: The cavity size was normal. Systolic function was   normal. The estimated ejection fraction was in the range of  60% to 65%. Wall motion was normal; there were no regional wall   motion abnormalities. The study is not technically sufficient to   allow evaluation of LV diastolic function.Pulmonary arteries: PA peak pressure: 32 mm Hg (S).   Coronary angiogram 11/24/2016:  Normal coronaries; tortuous LAD  EKG:   EKG 04/20/2019: Sinus rhythm at a rate of 79 bpm, left atrial enlargement.  Left axis, left anterior fascicular block.  Incomplete right bundle branch block.  Poor R wave progression, cannot exclude anteroseptal infarct old.  Nonspecific T wave abnormality.  Compared to EKG 02/02/2020, no significant change.  EKG 02/02/2020: Sinus tachycardia at the rate of 103 bpm, left axis deviation, left anterior fascicular block.  Incomplete right bundle branch block.  Poor R wave progression, cannot exclude anteroseptal infarct old.  Nonspecific T abnormality.  No significant change  from 06/12/2019.  EKG 06/20/2018: Atrial tachycardia at the rate of 133 bpm.  Left axis deviation, left anterior fascicular block.  Poor R-wave progression.  Incomplete right bundle branch block.  Assessment     ICD-10-CM   1. Paroxysmal atrial fibrillation (HCC)  I48.0   2. Syncope and collapse  R55 EKG 12-Lead    No orders of the defined types were placed in this encounter.   Medications Discontinued During This Encounter  Medication Reason  . feeding supplement (BOOST / RESOURCE BREEZE) LIQD No longer needed (for PRN medications)     Recommendations:   Cynthia [name]  is a 80 y.o. Caucasian female with hypertension, hyperlipidemia, frequent UTI,  paroxysmal atrial fibrillation, atrial tachycardia, obstructive sleep apnea on CPAP. Tolerating anticoagulation well. Flecainide discontinued due to dizziness. In the past she has had recurrent episodes of near syncope and severe orthostatic changes with recurrent UTI.   Patient presents for follow up of syncopal episode 04/10/2020. Details regarding syncopal episode are unclear as patient has no recollection the event and husband did not witness the event, therefore history of the event is third hand from the nursing aid who was helping patient to the restroom at the time. Evaluation in the emergency department was negative for anemia or electrolyte abnormalities as etiology of syncope. Etiology of syncopal episode is unclear, although suspect may have been related to underlying atrial fibrillation vs dehydration.   Discussed at length with patient and her husband regarding potential etiologies of syncope including atrial fibrillation. Discussed potential management and evaluation options including watchful waiting, antiarrythmic therapy vs implantation of loop recorder. Discussed indication, risks and benefits of loop recorder implantation, patient and husband verbalized understanding. In view of patient's sudden syncopal event presumably without  prodromal symptoms, shared decision was to proceed with implantation of loop recorder.   Patient's blood pressure elevated in the office today, however she has only been on losartan and HCTZ for PCP for 5 days and continues to have pain from foot surgery. Will reevaluate control of hypertension at follow up visit. She is not orthostatic today in the office.   Encouraged liberal hydration. Patient will notify our office if she experiences recurrence of symptoms.   Will schedule for loop recorder implantation.   Follow up in 1 month.   Patient was seen in collaboration with Dr. Jacinto Halim. He also reviewed patient's chart and Dr. Jacinto Halim is in agreement of the plan.     Rayford Halsted, PA-C 04/19/2020, 5:29 PM Office: [phone]

## 2020-04-22 DIAGNOSIS — F0280 Dementia in other diseases classified elsewhere without behavioral disturbance: Secondary | ICD-10-CM | POA: Diagnosis not present

## 2020-04-22 DIAGNOSIS — G319 Degenerative disease of nervous system, unspecified: Secondary | ICD-10-CM | POA: Diagnosis not present

## 2020-04-22 DIAGNOSIS — G9341 Metabolic encephalopathy: Secondary | ICD-10-CM | POA: Diagnosis not present

## 2020-04-22 DIAGNOSIS — I10 Essential (primary) hypertension: Secondary | ICD-10-CM | POA: Diagnosis not present

## 2020-04-22 DIAGNOSIS — G4761 Periodic limb movement disorder: Secondary | ICD-10-CM | POA: Diagnosis not present

## 2020-04-22 DIAGNOSIS — I480 Paroxysmal atrial fibrillation: Secondary | ICD-10-CM | POA: Diagnosis not present

## 2020-04-22 DIAGNOSIS — I700 Atherosclerosis of aorta: Secondary | ICD-10-CM | POA: Diagnosis not present

## 2020-04-22 DIAGNOSIS — Z4789 Encounter for other orthopedic aftercare: Secondary | ICD-10-CM | POA: Diagnosis not present

## 2020-04-22 DIAGNOSIS — G4489 Other headache syndrome: Secondary | ICD-10-CM | POA: Diagnosis not present

## 2020-04-23 DIAGNOSIS — I480 Paroxysmal atrial fibrillation: Secondary | ICD-10-CM | POA: Diagnosis not present

## 2020-04-23 DIAGNOSIS — G9341 Metabolic encephalopathy: Secondary | ICD-10-CM | POA: Diagnosis not present

## 2020-04-23 DIAGNOSIS — Z4789 Encounter for other orthopedic aftercare: Secondary | ICD-10-CM | POA: Diagnosis not present

## 2020-04-23 DIAGNOSIS — I10 Essential (primary) hypertension: Secondary | ICD-10-CM | POA: Diagnosis not present

## 2020-04-23 DIAGNOSIS — F0280 Dementia in other diseases classified elsewhere without behavioral disturbance: Secondary | ICD-10-CM | POA: Diagnosis not present

## 2020-04-23 DIAGNOSIS — G4489 Other headache syndrome: Secondary | ICD-10-CM | POA: Diagnosis not present

## 2020-04-23 DIAGNOSIS — G4761 Periodic limb movement disorder: Secondary | ICD-10-CM | POA: Diagnosis not present

## 2020-04-23 DIAGNOSIS — I700 Atherosclerosis of aorta: Secondary | ICD-10-CM | POA: Diagnosis not present

## 2020-04-23 DIAGNOSIS — G319 Degenerative disease of nervous system, unspecified: Secondary | ICD-10-CM | POA: Diagnosis not present

## 2020-04-25 DIAGNOSIS — R399 Unspecified symptoms and signs involving the genitourinary system: Secondary | ICD-10-CM | POA: Diagnosis not present

## 2020-04-25 DIAGNOSIS — N390 Urinary tract infection, site not specified: Secondary | ICD-10-CM | POA: Diagnosis not present

## 2020-04-25 DIAGNOSIS — I10 Essential (primary) hypertension: Secondary | ICD-10-CM | POA: Diagnosis not present

## 2020-04-25 DIAGNOSIS — G4489 Other headache syndrome: Secondary | ICD-10-CM | POA: Diagnosis not present

## 2020-04-25 DIAGNOSIS — I480 Paroxysmal atrial fibrillation: Secondary | ICD-10-CM | POA: Diagnosis not present

## 2020-04-25 DIAGNOSIS — G9341 Metabolic encephalopathy: Secondary | ICD-10-CM | POA: Diagnosis not present

## 2020-04-25 DIAGNOSIS — F0280 Dementia in other diseases classified elsewhere without behavioral disturbance: Secondary | ICD-10-CM | POA: Diagnosis not present

## 2020-04-25 DIAGNOSIS — Z4789 Encounter for other orthopedic aftercare: Secondary | ICD-10-CM | POA: Diagnosis not present

## 2020-04-25 DIAGNOSIS — G4761 Periodic limb movement disorder: Secondary | ICD-10-CM | POA: Diagnosis not present

## 2020-04-25 DIAGNOSIS — G319 Degenerative disease of nervous system, unspecified: Secondary | ICD-10-CM | POA: Diagnosis not present

## 2020-04-25 DIAGNOSIS — I700 Atherosclerosis of aorta: Secondary | ICD-10-CM | POA: Diagnosis not present

## 2020-04-30 DIAGNOSIS — F0280 Dementia in other diseases classified elsewhere without behavioral disturbance: Secondary | ICD-10-CM | POA: Diagnosis not present

## 2020-04-30 DIAGNOSIS — I10 Essential (primary) hypertension: Secondary | ICD-10-CM | POA: Diagnosis not present

## 2020-04-30 DIAGNOSIS — I700 Atherosclerosis of aorta: Secondary | ICD-10-CM | POA: Diagnosis not present

## 2020-04-30 DIAGNOSIS — G319 Degenerative disease of nervous system, unspecified: Secondary | ICD-10-CM | POA: Diagnosis not present

## 2020-04-30 DIAGNOSIS — G9341 Metabolic encephalopathy: Secondary | ICD-10-CM | POA: Diagnosis not present

## 2020-04-30 DIAGNOSIS — G4489 Other headache syndrome: Secondary | ICD-10-CM | POA: Diagnosis not present

## 2020-04-30 DIAGNOSIS — G4761 Periodic limb movement disorder: Secondary | ICD-10-CM | POA: Diagnosis not present

## 2020-04-30 DIAGNOSIS — Z4789 Encounter for other orthopedic aftercare: Secondary | ICD-10-CM | POA: Diagnosis not present

## 2020-04-30 DIAGNOSIS — I480 Paroxysmal atrial fibrillation: Secondary | ICD-10-CM | POA: Diagnosis not present

## 2020-05-01 ENCOUNTER — Ambulatory Visit (INDEPENDENT_AMBULATORY_CARE_PROVIDER_SITE_OTHER): Payer: Medicare PPO | Admitting: Podiatry

## 2020-05-01 ENCOUNTER — Other Ambulatory Visit: Payer: Self-pay

## 2020-05-01 DIAGNOSIS — Z9889 Other specified postprocedural states: Secondary | ICD-10-CM | POA: Diagnosis not present

## 2020-05-01 DIAGNOSIS — L97511 Non-pressure chronic ulcer of other part of right foot limited to breakdown of skin: Secondary | ICD-10-CM

## 2020-05-01 DIAGNOSIS — M898X7 Other specified disorders of bone, ankle and foot: Secondary | ICD-10-CM

## 2020-05-01 MED ORDER — DOXYCYCLINE HYCLATE 100 MG PO TABS: 100.0000 mg | ORAL_TABLET | Freq: Two times a day (BID) | ORAL | 0 refills | Status: DC

## 2020-05-02 ENCOUNTER — Encounter: Payer: Self-pay | Admitting: Podiatry

## 2020-05-02 DIAGNOSIS — I480 Paroxysmal atrial fibrillation: Secondary | ICD-10-CM | POA: Diagnosis not present

## 2020-05-02 DIAGNOSIS — G319 Degenerative disease of nervous system, unspecified: Secondary | ICD-10-CM | POA: Diagnosis not present

## 2020-05-02 DIAGNOSIS — G4489 Other headache syndrome: Secondary | ICD-10-CM | POA: Diagnosis not present

## 2020-05-02 DIAGNOSIS — I10 Essential (primary) hypertension: Secondary | ICD-10-CM | POA: Diagnosis not present

## 2020-05-02 DIAGNOSIS — F0280 Dementia in other diseases classified elsewhere without behavioral disturbance: Secondary | ICD-10-CM | POA: Diagnosis not present

## 2020-05-02 DIAGNOSIS — G4761 Periodic limb movement disorder: Secondary | ICD-10-CM | POA: Diagnosis not present

## 2020-05-02 DIAGNOSIS — Z4789 Encounter for other orthopedic aftercare: Secondary | ICD-10-CM | POA: Diagnosis not present

## 2020-05-02 DIAGNOSIS — I700 Atherosclerosis of aorta: Secondary | ICD-10-CM | POA: Diagnosis not present

## 2020-05-02 DIAGNOSIS — G9341 Metabolic encephalopathy: Secondary | ICD-10-CM | POA: Diagnosis not present

## 2020-05-02 MED ORDER — DOXYCYCLINE HYCLATE 100 MG PO TABS: 100.0000 mg | ORAL_TABLET | Freq: Two times a day (BID) | ORAL | 0 refills | Status: DC

## 2020-05-02 NOTE — Progress Notes (Signed)
Subjective:  [name], female    DOB: 1941-04-02,  MRN: [id number]  Chief Complaint  Patient presents with  . Routine Post Op    Pt is having some pain and redness at the incision site.    DOS: 03/04/2020 Procedure: Bilateral midfoot exostectomy  80 y.o. female returns for post-op check.  Patient is doing well.  She states that they have been doing Betadine wet to dressing changes to the right side.  The left side is doing really good.  Denies any other acute complaints  Review of Systems: Negative except as noted in the HPI. Denies N/V/F/Ch.  Past Medical History:  Diagnosis Date  . Acute blood loss anemia 10/27/2017  . Acute GI bleeding 10/27/2017  . Anemia   . Arthritis    "joints" (11/10/2017)  . Dizziness   . GERD (gastroesophageal reflux disease)   . GI bleed 11/06/2017  . Hearing loss   . History of blood transfusion 10/2017  . Hyperlipidemia   . Hyperthyroidism   . Melanoma ([place])    "cut off my back"  . Memory loss   . Sinus headache   . Sleep apnea     Current Outpatient Medications:  .  doxycycline (VIBRA-TABS) 100 MG tablet, Take 1 tablet (100 mg total) by mouth 2 (two) times daily., Disp: 20 tablet, Rfl: 0 .  acetaminophen (TYLENOL) 500 MG tablet, Take 500 mg by mouth 2 (two) times daily., Disp: , Rfl:  .  Alpha-Lipoic Acid (LIPOIC ACID PO), Take 600 mg by mouth 2 (two) times daily., Disp: , Rfl:  .  atorvastatin (LIPITOR) 10 MG tablet, TAKE 1 TABLET BY MOUTH EVERY DAY (Patient taking differently: Take 10 mg by mouth at bedtime.), Disp: 90 tablet, Rfl: 3 .  Calcium Carbonate-Vitamin D3 600-400 MG-UNIT TABS, Take 1 tablet by mouth 2 (two) times a day. , Disp: , Rfl:  .  cholecalciferol (VITAMIN D) 1000 UNITS tablet, Take 1,000 Units by mouth daily., Disp: , Rfl:  .  cycloSPORINE (RESTASIS) 0.05 % ophthalmic emulsion, Place 1 drop into both eyes 2 (two) times daily. , Disp: , Rfl:  .  diltiazem (CARDIZEM CD) 120 MG 24 hr capsule, TAKE 1 CAPSULE BY  MOUTH EVERY DAY (Patient taking differently: Take 120 mg by mouth daily.), Disp: 90 capsule, Rfl: 1 .  DULoxetine (CYMBALTA) 30 MG capsule, TAKE 1 CAPSULE BY MOUTH TWICE A DAY (Patient taking differently: Take 30 mg by mouth 2 (two) times daily.), Disp: 180 capsule, Rfl: 3 .  ELIQUIS 5 MG TABS tablet, TAKE 1 TABLET BY MOUTH TWICE A DAY (Patient taking differently: Take 5 mg by mouth 2 (two) times daily.), Disp: 60 tablet, Rfl: 5 .  estradiol (CLIMARA - DOSED IN MG/24 HR) 0.025 mg/24hr patch, Place 0.025 mg onto the skin every Sunday., Disp: , Rfl:  .  fluticasone (FLONASE) 50 MCG/ACT nasal spray, Place 1 spray into the nose daily as needed for allergies. , Disp: , Rfl:  .  GLUCOSAMINE-CHONDROITIN PO, Take 2 tablets by mouth daily at 12 noon., Disp: , Rfl:  .  hydrochlorothiazide (HYDRODIURIL) 25 MG tablet, Take 25 mg by mouth daily., Disp: , Rfl:  .  ibuprofen (ADVIL) 200 MG tablet, Take 2 tablets (400 mg total) by mouth 2 (two) times daily as needed. (Patient taking differently: Take 400 mg by mouth 2 (two) times daily as needed for mild pain.), Disp: 30 tablet, Rfl: 0 .  losartan (COZAAR) 100 MG tablet, Take 100 mg by mouth daily.,  Disp: , Rfl:  .  meclizine (ANTIVERT) 25 MG tablet, Take 25 mg by mouth daily as needed for dizziness., Disp: , Rfl:  .  Melatonin 5 MG CAPS, Take 5 mg by mouth at bedtime. , Disp: , Rfl:  .  Multiple Vitamin (MULTIVITAMIN WITH MINERALS) TABS tablet, Take 1 tablet by mouth daily., Disp: , Rfl:  .  nitrofurantoin (MACRODANTIN) 100 MG capsule, Take 100 mg by mouth daily., Disp: , Rfl:  .  nitroGLYCERIN (NITROLINGUAL) 0.4 MG/SPRAY spray, PLACE 1 SPRAY UNDER THE TONGUE EVERY 5 (FIVE) MINUTES FOR 3 DOSES AS NEEDED FOR CHEST PAIN (Patient taking differently: Place 1 spray under the tongue every 5 (five) minutes x 3 doses as needed for chest pain.), Disp: 12 g, Rfl: 1 .  Omega-3 1000 MG CAPS, Take 2,000 mg by mouth daily. , Disp: , Rfl:  .  pantoprazole (PROTONIX) 40 MG tablet,  Take 40 mg by mouth daily before breakfast. , Disp: , Rfl: 1 .  polyethylene glycol (MIRALAX / GLYCOLAX) 17 g packet, Take 17 g by mouth daily as needed for mild constipation., Disp: 14 each, Rfl: 0 .  QUEtiapine (SEROQUEL) 25 MG tablet, Take 1 tablet (25 mg total) by mouth at bedtime., Disp: , Rfl:  .  thyroid (ARMOUR) 60 MG tablet, Take 60 mg by mouth daily before breakfast., Disp: , Rfl:  .  tiZANidine (ZANAFLEX) 4 MG tablet, Take 1 tablet (4 mg total) by mouth 3 (three) times daily as needed. (Patient taking differently: Take 4 mg by mouth 3 (three) times daily as needed for muscle spasms.), Disp: 90 tablet, Rfl: 5 .  vitamin C (ASCORBIC ACID) 500 MG tablet, Take 500 mg by mouth daily. , Disp: , Rfl:   Social History   Tobacco Use  Smoking Status Former Smoker  . Years: 2.00  . Types: Cigarettes  . Quit date: 36  . Years since quitting: 42.0  Smokeless Tobacco Never Used  Tobacco Comment   11/10/2017 "only smoked when I was on my period"    Allergies  Allergen Reactions  . Avelox [Moxifloxacin Hcl In Nacl] Other (See Comments)    Unknown reaction  . Other Other (See Comments)    Other reaction(s): flu like symptoms  . Alendronate Sodium Nausea Only   Objective:  There were no vitals filed for this visit. There is no height or weight on file to calculate BMI. Constitutional Well developed. Well nourished.  Vascular Foot warm and well perfused. Capillary refill normal to all digits.   Neurologic Normal speech. Oriented to person, place, and time. Epicritic sensation to light touch grossly present bilaterally.  Dermatologic  right medial foot ulceration limited to the breakdown of the skin.  Does not probe down to bone.  No malodor present.  Beefy red/fibrotic granular tissue noted.  Orthopedic:  No tenderness to palpation noted about the surgical site.   Radiographs: Adequate resection noted.  3 views of skeletally mature adult bilateral foot: Adequate resection good  correction alignment noted.  Of pes planovalgus foot deformity noted. Assessment:   1. Post-operative state   2. Exostosis of right foot   3. Exostosis of left foot   4. Right foot ulcer, limited to breakdown of skin (Protivin)    Plan:  Patient was evaluated and treated and all questions answered.  Right foot ulcer limited to the breakdown of the skin -I explained the patient the etiology of ulceration versus treatment options were discussed.  Given that patient has a formed ulceration we  will plan on doing local wound care to allow it to close up.  Continue doing Betadine wet-to-dry dressing changes as well as wearing surgical shoe to allow proper offloading.  Patient states understanding and will do so.  Procedure: Excisional Debridement of Wound Tool: Sharp chisel blade/tissue nipper Rationale: Removal of non-viable soft tissue from the wound to promote healing.  Anesthesia: none Pre-Debridement Wound Measurements: 0.7 cm x 0.5 cm x 0.3 cm  Post-Debridement Wound Measurements: 0.9 cm x 0.6 cm x 0.2 cm  Type of Debridement: Sharp Excisional Tissue Removed: Non-viable soft tissue Blood loss: Minimal (<50cc) Depth of Debridement: subcutaneous tissue. Technique: Sharp excisional debridement to bleeding, viable wound base.  Wound Progress: This is my initial evaluation I will continue monitor the progression of the wound. Site healing conversation 7 Dressing: Dry, sterile, compression dressing. Disposition: Patient tolerated procedure well. Patient to return in 1 week for follow-up.  No follow-ups on file.     Status post foot surgery bilaterally -Clinically healing well especially left side.  The left side has completely epithelialized.  The right side there is still a wound presentation that we will need some local wound care to allow it to close up.  I discussed that continue doing Betadine wet-to-dry dressing changes.  However left side can go back to regular shoes without any  restrictions.  No follow-ups on file.

## 2020-05-02 NOTE — Addendum Note (Signed)
Addended by: Boneta Lucks on: 05/02/2020 09:59 AM   Modules accepted: Orders

## 2020-05-03 ENCOUNTER — Ambulatory Visit: Payer: Medicare PPO | Admitting: Podiatry

## 2020-05-03 DIAGNOSIS — F0280 Dementia in other diseases classified elsewhere without behavioral disturbance: Secondary | ICD-10-CM | POA: Diagnosis not present

## 2020-05-03 DIAGNOSIS — I10 Essential (primary) hypertension: Secondary | ICD-10-CM | POA: Diagnosis not present

## 2020-05-03 DIAGNOSIS — G319 Degenerative disease of nervous system, unspecified: Secondary | ICD-10-CM | POA: Diagnosis not present

## 2020-05-03 DIAGNOSIS — G4761 Periodic limb movement disorder: Secondary | ICD-10-CM | POA: Diagnosis not present

## 2020-05-03 DIAGNOSIS — I480 Paroxysmal atrial fibrillation: Secondary | ICD-10-CM | POA: Diagnosis not present

## 2020-05-03 DIAGNOSIS — I700 Atherosclerosis of aorta: Secondary | ICD-10-CM | POA: Diagnosis not present

## 2020-05-03 DIAGNOSIS — G4489 Other headache syndrome: Secondary | ICD-10-CM | POA: Diagnosis not present

## 2020-05-03 DIAGNOSIS — G9341 Metabolic encephalopathy: Secondary | ICD-10-CM | POA: Diagnosis not present

## 2020-05-03 DIAGNOSIS — Z4789 Encounter for other orthopedic aftercare: Secondary | ICD-10-CM | POA: Diagnosis not present

## 2020-05-06 DIAGNOSIS — G4489 Other headache syndrome: Secondary | ICD-10-CM | POA: Diagnosis not present

## 2020-05-06 DIAGNOSIS — I700 Atherosclerosis of aorta: Secondary | ICD-10-CM | POA: Diagnosis not present

## 2020-05-06 DIAGNOSIS — F0280 Dementia in other diseases classified elsewhere without behavioral disturbance: Secondary | ICD-10-CM | POA: Diagnosis not present

## 2020-05-06 DIAGNOSIS — Z4789 Encounter for other orthopedic aftercare: Secondary | ICD-10-CM | POA: Diagnosis not present

## 2020-05-06 DIAGNOSIS — G4761 Periodic limb movement disorder: Secondary | ICD-10-CM | POA: Diagnosis not present

## 2020-05-06 DIAGNOSIS — G319 Degenerative disease of nervous system, unspecified: Secondary | ICD-10-CM | POA: Diagnosis not present

## 2020-05-06 DIAGNOSIS — I10 Essential (primary) hypertension: Secondary | ICD-10-CM | POA: Diagnosis not present

## 2020-05-06 DIAGNOSIS — I480 Paroxysmal atrial fibrillation: Secondary | ICD-10-CM | POA: Diagnosis not present

## 2020-05-06 DIAGNOSIS — G9341 Metabolic encephalopathy: Secondary | ICD-10-CM | POA: Diagnosis not present

## 2020-05-08 ENCOUNTER — Telehealth: Payer: Self-pay | Admitting: *Deleted

## 2020-05-08 DIAGNOSIS — G4489 Other headache syndrome: Secondary | ICD-10-CM | POA: Diagnosis not present

## 2020-05-08 DIAGNOSIS — Z4789 Encounter for other orthopedic aftercare: Secondary | ICD-10-CM | POA: Diagnosis not present

## 2020-05-08 DIAGNOSIS — I10 Essential (primary) hypertension: Secondary | ICD-10-CM | POA: Diagnosis not present

## 2020-05-08 DIAGNOSIS — I700 Atherosclerosis of aorta: Secondary | ICD-10-CM | POA: Diagnosis not present

## 2020-05-08 DIAGNOSIS — I480 Paroxysmal atrial fibrillation: Secondary | ICD-10-CM | POA: Diagnosis not present

## 2020-05-08 DIAGNOSIS — G319 Degenerative disease of nervous system, unspecified: Secondary | ICD-10-CM | POA: Diagnosis not present

## 2020-05-08 DIAGNOSIS — F0280 Dementia in other diseases classified elsewhere without behavioral disturbance: Secondary | ICD-10-CM | POA: Diagnosis not present

## 2020-05-08 DIAGNOSIS — G9341 Metabolic encephalopathy: Secondary | ICD-10-CM | POA: Diagnosis not present

## 2020-05-08 DIAGNOSIS — G4761 Periodic limb movement disorder: Secondary | ICD-10-CM | POA: Diagnosis not present

## 2020-05-08 NOTE — Telephone Encounter (Signed)
Rica Mote w/ [facility] is having concerns about patient's right foot which is still throbbing, tender,stabbing pain esp.w/ walking, vitals are normal and last dosage of Doxycycline-100mg  will be taken on the Jan. 29th. Please advise.

## 2020-05-08 NOTE — Telephone Encounter (Signed)
[place], left VM for return call back.

## 2020-05-08 NOTE — Telephone Encounter (Signed)
She is having  that pain because the would is still open and hasnt closed. We can continue her doxycycline for another ten days. I will decided if she needs wound care center afterward.

## 2020-05-09 ENCOUNTER — Other Ambulatory Visit: Payer: Self-pay | Admitting: Neurology

## 2020-05-09 DIAGNOSIS — I480 Paroxysmal atrial fibrillation: Secondary | ICD-10-CM | POA: Diagnosis not present

## 2020-05-09 DIAGNOSIS — I10 Essential (primary) hypertension: Secondary | ICD-10-CM | POA: Diagnosis not present

## 2020-05-09 DIAGNOSIS — Z4789 Encounter for other orthopedic aftercare: Secondary | ICD-10-CM | POA: Diagnosis not present

## 2020-05-09 DIAGNOSIS — G9341 Metabolic encephalopathy: Secondary | ICD-10-CM | POA: Diagnosis not present

## 2020-05-10 ENCOUNTER — Telehealth: Payer: Self-pay | Admitting: *Deleted

## 2020-05-10 NOTE — Telephone Encounter (Signed)
Patient's husband is calling because she is having throbbing pain at times, tenderness of the surgery right foot. She is currently on antibiotics but may not be strong enough. The left foot is healing fine. Please advise.

## 2020-05-10 NOTE — Telephone Encounter (Signed)
Called and left [facility] for [facility] per Dr Serita Grit note concerning patient still having foot pain.

## 2020-05-12 MED ORDER — ACETAMINOPHEN-CODEINE #3 300-30 MG PO TABS: 1.0000 | ORAL_TABLET | ORAL | 0 refills | Status: DC | PRN

## 2020-05-12 NOTE — Telephone Encounter (Signed)
I sent her tylenol #3 for pain control see if that helps

## 2020-05-13 ENCOUNTER — Telehealth: Payer: Self-pay

## 2020-05-13 DIAGNOSIS — G4761 Periodic limb movement disorder: Secondary | ICD-10-CM | POA: Diagnosis not present

## 2020-05-13 DIAGNOSIS — G9341 Metabolic encephalopathy: Secondary | ICD-10-CM | POA: Diagnosis not present

## 2020-05-13 DIAGNOSIS — G319 Degenerative disease of nervous system, unspecified: Secondary | ICD-10-CM | POA: Diagnosis not present

## 2020-05-13 DIAGNOSIS — I10 Essential (primary) hypertension: Secondary | ICD-10-CM | POA: Diagnosis not present

## 2020-05-13 DIAGNOSIS — Z4789 Encounter for other orthopedic aftercare: Secondary | ICD-10-CM | POA: Diagnosis not present

## 2020-05-13 DIAGNOSIS — F0280 Dementia in other diseases classified elsewhere without behavioral disturbance: Secondary | ICD-10-CM | POA: Diagnosis not present

## 2020-05-13 DIAGNOSIS — I480 Paroxysmal atrial fibrillation: Secondary | ICD-10-CM | POA: Diagnosis not present

## 2020-05-13 DIAGNOSIS — I700 Atherosclerosis of aorta: Secondary | ICD-10-CM | POA: Diagnosis not present

## 2020-05-13 DIAGNOSIS — G4489 Other headache syndrome: Secondary | ICD-10-CM | POA: Diagnosis not present

## 2020-05-13 MED ORDER — DOXYCYCLINE HYCLATE 100 MG PO TABS: 100.0000 mg | ORAL_TABLET | Freq: Two times a day (BID) | ORAL | 0 refills | Status: DC

## 2020-05-13 NOTE — Telephone Encounter (Signed)
I sent her antibiotics  as well as pain meds to her pharmacy

## 2020-05-13 NOTE — Telephone Encounter (Signed)
Informed patient and husband that a prescription has been to pharmacy on file per Dr Posey Pronto, both verbalized understanding.

## 2020-05-13 NOTE — Telephone Encounter (Signed)
[facility] care nurse called today, stating that the pt is in pain. The pt just finished he antibiotics and still has redness on her right foot. Please advise

## 2020-05-14 DIAGNOSIS — G4761 Periodic limb movement disorder: Secondary | ICD-10-CM | POA: Diagnosis not present

## 2020-05-14 DIAGNOSIS — Z4789 Encounter for other orthopedic aftercare: Secondary | ICD-10-CM | POA: Diagnosis not present

## 2020-05-14 DIAGNOSIS — G319 Degenerative disease of nervous system, unspecified: Secondary | ICD-10-CM | POA: Diagnosis not present

## 2020-05-14 DIAGNOSIS — G9341 Metabolic encephalopathy: Secondary | ICD-10-CM | POA: Diagnosis not present

## 2020-05-14 DIAGNOSIS — G4489 Other headache syndrome: Secondary | ICD-10-CM | POA: Diagnosis not present

## 2020-05-14 DIAGNOSIS — I700 Atherosclerosis of aorta: Secondary | ICD-10-CM | POA: Diagnosis not present

## 2020-05-14 DIAGNOSIS — I480 Paroxysmal atrial fibrillation: Secondary | ICD-10-CM | POA: Diagnosis not present

## 2020-05-14 DIAGNOSIS — F0280 Dementia in other diseases classified elsewhere without behavioral disturbance: Secondary | ICD-10-CM | POA: Diagnosis not present

## 2020-05-14 DIAGNOSIS — I10 Essential (primary) hypertension: Secondary | ICD-10-CM | POA: Diagnosis not present

## 2020-05-15 ENCOUNTER — Other Ambulatory Visit: Payer: Self-pay

## 2020-05-15 ENCOUNTER — Encounter: Payer: Self-pay | Admitting: Podiatry

## 2020-05-15 ENCOUNTER — Ambulatory Visit (INDEPENDENT_AMBULATORY_CARE_PROVIDER_SITE_OTHER): Payer: Medicare PPO | Admitting: Podiatry

## 2020-05-15 ENCOUNTER — Ambulatory Visit (INDEPENDENT_AMBULATORY_CARE_PROVIDER_SITE_OTHER): Payer: Medicare PPO

## 2020-05-15 DIAGNOSIS — M898X7 Other specified disorders of bone, ankle and foot: Secondary | ICD-10-CM

## 2020-05-15 DIAGNOSIS — L97511 Non-pressure chronic ulcer of other part of right foot limited to breakdown of skin: Secondary | ICD-10-CM

## 2020-05-15 DIAGNOSIS — Z9889 Other specified postprocedural states: Secondary | ICD-10-CM

## 2020-05-16 NOTE — Progress Notes (Signed)
Primary Physician/Referring:  Deland Pretty, MD  [name], female    DOB: [birth year], 80 y.o.   MRN: [id number]  Chief Complaint  Patient presents with  . Atrial Fibrillation  . Loss of Consciousness    4 weeks   HPI:    Cynthia [name]  is a 80 y.o. Caucasian female with hypertension, hyperlipidemia, frequent UTI,  paroxysmal atrial fibrillation, atrial tachycardia, obstructive sleep apnea on CPAP. Tolerating anticoagulation well. Flecainide discontinued due to dizziness. In the past she has had recurrent episodes of near syncope and severe orthostatic changes with recurrent UTI.  However patient with episodes of syncope and near syncope not associated with UTI.  Patient underwent surgery to her right foot 03/04/2020 and had an episode of syncope while at rehabilitation facility on 04/10/2020.   Patient presents for 1 month follow up of paroxysmal atrial fibrillation and syncope. At last visit discussed loop recorder implantation, which patient had elected to move forward with.  Patient has had no recurrence of syncope or near syncope since 04/10/2020.  She continues to maintain sinus rhythm.  Denies chest pain, palpitations, fatigue, dyspnea, leg swelling.  She does continue to have pain in her right foot since surgery.  She also has occasional brief episodes of dizziness associated with change in position.  This is significantly improved since being home and drinking Pedialyte on a daily basis.  Patient's home blood pressure readings continue be elevated above goal primarily 140-150/80-90 mmHg.  Patient's husband reports that they have been working with Dr. Alyse Low, a doctor of pharmacology, on hypertension management as well.   Past Medical History:  Diagnosis Date  . Acute blood loss anemia 10/27/2017  . Acute GI bleeding 10/27/2017  . Anemia   . Arthritis    "joints" (11/10/2017)  . Dizziness   . GERD (gastroesophageal reflux disease)   . GI bleed 11/06/2017  .  Hearing loss   . History of blood transfusion 10/2017  . Hyperlipidemia   . Hyperthyroidism   . Melanoma ([place])    "cut off my back"  . Memory loss   . Sinus headache   . Sleep apnea    Past Surgical History:  Procedure Laterality Date  . ABDOMINAL HYSTERECTOMY     "partial"  . APPENDECTOMY    . BIOPSY  11/17/2017   Procedure: BIOPSY;  Surgeon: Clarene Essex, MD;  Location: [facility];  Service: Endoscopy;;  . COLONOSCOPY WITH PROPOFOL N/A 11/17/2017   Procedure: COLONOSCOPY WITH PROPOFOL;  Surgeon: Clarene Essex, MD;  Location: [facility];  Service: Endoscopy;  Laterality: N/A;  . CRANIECTOMY FOR EXCISION OF ACOUSTIC NEUROMA    . ESOPHAGOGASTRODUODENOSCOPY (EGD) WITH PROPOFOL N/A 10/28/2017   Procedure: ESOPHAGOGASTRODUODENOSCOPY (EGD) WITH PROPOFOL;  Surgeon: Clarene Essex, MD;  Location: [facility];  Service: Endoscopy;  Laterality: N/A;  . FOOT SURGERY Bilateral 04/18/2020  . KNEE ARTHROSCOPY Left   . LEFT HEART CATH AND CORONARY ANGIOGRAPHY N/A 11/24/2016   Procedure: LEFT HEART CATH AND CORONARY ANGIOGRAPHY;  Surgeon: Jettie Booze, MD;  Location: [facility];  Service: Cardiovascular;  Laterality: N/A;  . MELANOMA EXCISION     "off my back"  . NASAL SINUS SURGERY    . TONSILLECTOMY     Family History  Problem Relation Age of Onset  . Congestive Heart Failure Mother   . Heart disease Father        History of heart attacks at a later age.    Marland Kitchen Heart disease Brother   .  Hearing loss Brother     Social History   Tobacco Use  . Smoking status: Former Smoker    Years: 2.00    Types: Cigarettes    Quit date: 1980    Years since quitting: 42.1  . Smokeless tobacco: Never Used  . Tobacco comment: 11/10/2017 "only smoked when I was on my period"  Substance Use Topics  . Alcohol use: Yes    Comment: 11/10/2017 "nothing lately; did have 1 glass of wine q night"  Marital Status: Married   ROS  Review of Systems  Constitutional: Negative for malaise/fatigue and  weight gain.  Cardiovascular: Negative for chest pain, claudication, dyspnea on exertion, leg swelling, near-syncope, orthopnea, palpitations, paroxysmal nocturnal dyspnea and syncope.  Respiratory: Negative for shortness of breath.   Hematologic/Lymphatic: Does not bruise/bleed easily.  Gastrointestinal: Negative for melena.  Neurological: Positive for dizziness and light-headedness. Negative for weakness.   Objective  Blood pressure (!) 142/75, pulse 82, temperature 97.7 F (36.5 C), temperature source Temporal, resp. rate 17, height $RemoveBe'5\' 5"'XbooXgxFY$  (1.651 m), weight 150 lb 6.4 oz (68.2 kg), SpO2 97 %.  Vitals with BMI 05/17/2020 05/17/2020 05/17/2020  Height - - -  Weight - - -  BMI - - -  Systolic 176 160 737  Diastolic 75 76 80  Pulse 82 75 77    Orthostatic VS for the past 72 hrs (Last 3 readings):  Patient Position BP Location Cuff Size  05/17/20 0936 Sitting Left Arm Normal  05/17/20 0935 Sitting Left Arm Normal  05/17/20 0934 Supine Left Arm Normal     Physical Exam Vitals reviewed.  HENT:     Head: Normocephalic and atraumatic.  Cardiovascular:     Rate and Rhythm: Normal rate and regular rhythm.     Pulses: Intact distal pulses.     Heart sounds: Normal heart sounds, S1 normal and S2 normal. No murmur heard. No gallop.      Comments: No leg edema, no JVD. Pulmonary:     Effort: Pulmonary effort is normal. No respiratory distress.     Breath sounds: Normal breath sounds. No wheezing, rhonchi or rales.  Musculoskeletal:     Right lower leg: No edema.     Left lower leg: No edema.  Skin:    General: Skin is warm and dry.  Neurological:     Mental Status: She is alert.    Laboratory examination:   Recent Labs    06/30/19 1740 01/24/20 1159 03/25/20 1308 03/26/20 0659 04/10/20 1034  NA 136   < > 129* 132* 144  K 3.2*   < > 3.3* 3.4* 3.2*  CL 101   < > 93* 97* 107  CO2 25   < > $R'24 22 27  'PG$ GLUCOSE 118*   < > 128* 114* 105*  BUN 16   < > $R'16 11 19  'Vp$ CREATININE 0.67   < >  0.68 0.57 0.66  CALCIUM 8.9   < > 9.1 9.9 9.3  GFRNONAA >60   < > >60 >60 >60  GFRAA >60  --   --   --   --    < > = values in this interval not displayed.   CrCl cannot be calculated (Patient's most recent lab result is older than the maximum 21 days allowed.).  CMP Latest Ref Rng & Units 04/10/2020 03/26/2020 03/25/2020  Glucose 70 - 99 mg/dL 105(H) 114(H) 128(H)  BUN 8 - 23 mg/dL $Remove'19 11 16  'ELVQYEc$ Creatinine 0.44 - 1.00 mg/dL 0.66  0.57 0.68  Sodium 135 - 145 mmol/L 144 132(L) 129(L)  Potassium 3.5 - 5.1 mmol/L 3.2(L) 3.4(L) 3.3(L)  Chloride 98 - 111 mmol/L 107 97(L) 93(L)  CO2 22 - 32 mmol/L $RemoveB'27 22 24  'nQeaDUfj$ Calcium 8.9 - 10.3 mg/dL 9.3 9.9 9.1  Total Protein 6.5 - 8.1 g/dL 6.1(L) - -  Total Bilirubin 0.3 - 1.2 mg/dL 0.8 - -  Alkaline Phos 38 - 126 U/L 47 - -  AST 15 - 41 U/L 20 - -  ALT 0 - 44 U/L 19 - -   CBC Latest Ref Rng & Units 04/10/2020 03/25/2020 03/24/2020  WBC 4.0 - 10.5 K/uL 6.2 8.2 8.8  Hemoglobin 12.0 - 15.0 g/dL 11.8(L) 12.6 12.4  Hematocrit 36.0 - 46.0 % 35.7(L) 34.8(L) 35.8(L)  Platelets 150 - 400 K/uL 280 245 278   Lipid Panel     Component Value Date/Time   CHOL 165 11/24/2016 0040   TRIG 88 11/24/2016 0040   HDL 61 11/24/2016 0040   CHOLHDL 2.7 11/24/2016 0040   VLDL 18 11/24/2016 0040   LDLCALC 86 11/24/2016 0040   HEMOGLOBIN A1C Lab Results  Component Value Date   HGBA1C 5.6 11/24/2016   MPG 114 11/24/2016   TSH Recent Labs    03/21/20 1219  TSH 0.974    External labs:   Cholesterol, total 145.000 m 09/01/2019 HDL 67.000 mg 10/13/2019 LDL-C 94.000 cal 10/13/2019 Triglycerides 80.000 mg 10/13/2019  Hemoglobin 10.900 g/d 01/24/2020 INR 1.160 11/06/2017 Platelets 197.000 K/ 01/24/2020  Creatinine, Serum 0.580 mg/ 01/24/2020 Potassium 3.500 mm 01/24/2020 ALT (SGPT) 15.000 IU/ 09/01/2019  TSH 0.870 10/13/2019  Hemoglobin 9.300 12/14/2018;  INR 1.160 11/06/2017 Platelets 188.000 12/14/2018  Creatinine, Serum 0.970 12/14/2018 Potassium 4.000  12/14/2018 Magnesium N/D ALT (SGPT) 15.000 12/14/2018  Labs 11/02/2018: BUN 26, creatinine 0.9, EGFR greater than 60 mL, Hb 10.6/HCT 32.2, platelets 244.   Medications and allergies   Allergies  Allergen Reactions  . Avelox [Moxifloxacin Hcl In Nacl] Other (See Comments)    Unknown reaction  . Other Other (See Comments)    Other reaction(s): flu like symptoms  . Alendronate Sodium Nausea Only    Current Outpatient Medications on File Prior to Visit  Medication Sig Dispense Refill  . acetaminophen-codeine (TYLENOL #3) 300-30 MG tablet Take 1-2 tablets by mouth every 4 (four) hours as needed for moderate pain. 30 tablet 0  . Alpha-Lipoic Acid (LIPOIC ACID PO) Take 600 mg by mouth 2 (two) times daily.    Marland Kitchen atorvastatin (LIPITOR) 10 MG tablet TAKE 1 TABLET BY MOUTH EVERY DAY (Patient taking differently: Take 10 mg by mouth at bedtime.) 90 tablet 3  . Calcium Carbonate-Vitamin D3 600-400 MG-UNIT TABS Take 1 tablet by mouth 2 (two) times a day.     . cholecalciferol (VITAMIN D) 1000 UNITS tablet Take 1,000 Units by mouth daily.    . cycloSPORINE (RESTASIS) 0.05 % ophthalmic emulsion Place 1 drop into both eyes 2 (two) times daily.     Marland Kitchen diltiazem (CARDIZEM CD) 120 MG 24 hr capsule TAKE 1 CAPSULE BY MOUTH EVERY DAY (Patient taking differently: Take 120 mg by mouth daily.) 90 capsule 1  . DULoxetine (CYMBALTA) 30 MG capsule TAKE 1 CAPSULE BY MOUTH TWICE A DAY (Patient taking differently: Take 30 mg by mouth 2 (two) times daily.) 180 capsule 3  . ELIQUIS 5 MG TABS tablet TAKE 1 TABLET BY MOUTH TWICE A DAY (Patient taking differently: Take 5 mg by mouth 2 (two) times daily.) 60 tablet 5  .  estradiol (CLIMARA - DOSED IN MG/24 HR) 0.025 mg/24hr patch Place 0.025 mg onto the skin every Sunday.    . fluticasone (FLONASE) 50 MCG/ACT nasal spray Place 1 spray into the nose daily as needed for allergies.     Marland Kitchen GLUCOSAMINE-CHONDROITIN PO Take 2 tablets by mouth daily at 12 noon.    . hydrochlorothiazide  (HYDRODIURIL) 25 MG tablet Take 25 mg by mouth daily.    Marland Kitchen losartan (COZAAR) 100 MG tablet Take 100 mg by mouth daily.    . meclizine (ANTIVERT) 25 MG tablet Take 25 mg by mouth daily as needed for dizziness.    . Melatonin 5 MG CAPS Take 5 mg by mouth at bedtime.     . Multiple Vitamin (MULTIVITAMIN WITH MINERALS) TABS tablet Take 1 tablet by mouth daily.    . nitrofurantoin (MACRODANTIN) 100 MG capsule Take 100 mg by mouth daily.    . nitroGLYCERIN (NITROLINGUAL) 0.4 MG/SPRAY spray PLACE 1 SPRAY UNDER THE TONGUE EVERY 5 (FIVE) MINUTES FOR 3 DOSES AS NEEDED FOR CHEST PAIN (Patient taking differently: Place 1 spray under the tongue every 5 (five) minutes x 3 doses as needed for chest pain.) 12 g 1  . Omega-3 1000 MG CAPS Take 2,000 mg by mouth daily.     . pantoprazole (PROTONIX) 40 MG tablet Take 40 mg by mouth daily before breakfast.   1  . sertraline (ZOLOFT) 25 MG tablet Take 1 tablet by mouth daily.    Marland Kitchen thyroid (ARMOUR) 60 MG tablet Take 60 mg by mouth daily before breakfast.    . tiZANidine (ZANAFLEX) 4 MG tablet Take 1 tablet (4 mg total) by mouth 3 (three) times daily as needed. (Patient taking differently: Take 4 mg by mouth 3 (three) times daily as needed for muscle spasms.) 90 tablet 5  . vitamin C (ASCORBIC ACID) 500 MG tablet Take 500 mg by mouth daily.     Marland Kitchen acetaminophen (TYLENOL) 500 MG tablet Take 500 mg by mouth 2 (two) times daily.    Marland Kitchen ibuprofen (ADVIL) 200 MG tablet Take 2 tablets (400 mg total) by mouth 2 (two) times daily as needed. 30 tablet 0  . polyethylene glycol (MIRALAX / GLYCOLAX) 17 g packet Take 17 g by mouth daily as needed for mild constipation. (Patient not taking: Reported on 05/17/2020) 14 each 0  . QUEtiapine (SEROQUEL) 25 MG tablet Take 1 tablet (25 mg total) by mouth at bedtime.     No current facility-administered medications on file prior to visit.    Radiology:   DG Foot Complete Right  Result Date: 05/16/2020 Please see detailed radiograph report in  office note.   Cardiac Studies:   Event Monitor for 30 days Start date 07/01/2018 for syncope : A. Fib one episode, no symptoms reported. No heart block or significant bradycardia  Carotid duplex 03/27/2018:  Right Carotid: Velocities in the right ICA are consistent with a 1-39% stenosis. Left Carotid: Velocities in the left ICA are consistent with a 1-39% stenosis. Vertebrals:  Bilateral vertebral arteries demonstrate antegrade flow. Subclavians: Normal flow hemodynamics were seen in bilateral subclavian arteries.   Event Monitor 30 days 11/11/2017: Predominant rhythm is normal sinus rhythm. Symptoms of fatigue reveals normal sinus rhythm. Asymptomatic atrial fibrillation and probable atypical atrial flutter noted on 11/13/2017, 11/18/2017 and 12/09/2017 at 3:00 AM. Occasional PACs. Arrhythmia/PVC burden 6%.   Echo 11/09/2017: Left ventricle: The cavity size was normal. Systolic function was   normal. The estimated ejection fraction was in the range of  60% to 65%. Wall motion was normal; there were no regional wall   motion abnormalities. The study is not technically sufficient to   allow evaluation of LV diastolic function.Pulmonary arteries: PA peak pressure: 32 mm Hg (S).   Coronary angiogram 11/24/2016:  Normal coronaries; tortuous LAD  EKG:   EKG 05/17/2020: Sinus rhythm at a rate of 60 bpm, left atrial enlargement.  Left axis, left anterior fascicular block.  Nonspecific T wave abnormality.  Compared to EKG 04/19/2020, incomplete right bundle branch block and poor progression not present.  EKG 04/19/2020: Sinus rhythm at a rate of 79 bpm, left atrial enlargement.  Left axis, left anterior fascicular block.  Incomplete right bundle branch block.  Poor R wave progression, cannot exclude anteroseptal infarct old.  Nonspecific T wave abnormality.  Compared to EKG 02/02/2020, no significant change.  EKG 02/02/2020: Sinus tachycardia at the rate of 103 bpm, left axis deviation, left anterior  fascicular block.  Incomplete right bundle branch block.  Poor R wave progression, cannot exclude anteroseptal infarct old.  Nonspecific T abnormality.  No significant change from 06/12/2019.  EKG 06/20/2018: Atrial tachycardia at the rate of 133 bpm.  Left axis deviation, left anterior fascicular block.  Poor R-wave progression.  Incomplete right bundle branch block.  Assessment     ICD-10-CM   1. Syncope and collapse  R55   2. Paroxysmal atrial fibrillation (HCC)  I48.0 EKG 12-Lead  3. Essential hypertension, benign  I10     No orders of the defined types were placed in this encounter.   Medications Discontinued During This Encounter  Medication Reason  . doxycycline (VIBRA-TABS) 100 MG tablet Error  . doxycycline (VIBRA-TABS) 100 MG tablet Patient Preference     This patients CHA2DS2-VASc Score 4 (HTN, F, Age)and yearly risk of stroke 4.8%.   Recommendations:   Nehemie Casserly  is a 80 y.o. Caucasian female with hypertension, hyperlipidemia, frequent UTI,  paroxysmal atrial fibrillation, atrial tachycardia, obstructive sleep apnea on CPAP. Tolerating anticoagulation well. Flecainide discontinued due to dizziness. In the past she has had recurrent episodes of near syncope and severe orthostatic changes with recurrent UTI.  However patient with episodes of syncope and near syncope not associated with UTI.  Patient underwent surgery to her right foot 03/04/2020 and had an episode of syncope while at rehabilitation facility on 04/10/2020.   Patient presents for 1 month follow-up.  She has had no recurrence of syncope or near syncope and has had no known episodes of atrial fibrillation.  Overall patient is feeling well.  In view of recurrent episodes of syncope and near syncope with unknown etiology and concerning for cardiac arrhythmias we will schedule for loop recorder implantation, pending insurance approval.  In regard to paroxysmal atrial fibrillation, patient is currently maintaining  sinus rhythm and is tolerating Eliquis well without bleeding diathesis, therefore will continue.  In regard to hypertension, patient's blood pressure remains elevated above goal.  She is tolerating losartan and  hydrochlorthiazide well without issue.  She is also presently on diltiazem 120 mg daily.  Patient has been working with Dr. Alyse Low, [place].D., to improve blood pressure control.  Therefore I will reach out to her prior to making adjustments to patient's antihypertensive medications at today's office visit. Could consider switching hydrochlorothiazide to chlorthalidone or adding spironolactone.   Encouraged liberal hydration. Patient will notify our office if she experiences recurrence of syncope.   Follow-up in 6 weeks, sooner if needed, for hypertension.   Alethia Berthold, PA-C 05/17/2020, 12:46  PM Office: [phone]

## 2020-05-17 ENCOUNTER — Ambulatory Visit: Payer: Medicare PPO | Admitting: Student

## 2020-05-17 ENCOUNTER — Other Ambulatory Visit: Payer: Self-pay

## 2020-05-17 ENCOUNTER — Encounter: Payer: Self-pay | Admitting: Student

## 2020-05-17 VITALS — BP 142/75 | HR 82 | Temp 97.7°F | Resp 17 | Ht 65.0 in | Wt 150.4 lb

## 2020-05-17 DIAGNOSIS — R55 Syncope and collapse: Secondary | ICD-10-CM

## 2020-05-17 DIAGNOSIS — I10 Essential (primary) hypertension: Secondary | ICD-10-CM | POA: Diagnosis not present

## 2020-05-17 DIAGNOSIS — I480 Paroxysmal atrial fibrillation: Secondary | ICD-10-CM

## 2020-05-20 ENCOUNTER — Encounter (HOSPITAL_COMMUNITY): Payer: Self-pay | Admitting: Internal Medicine

## 2020-05-20 DIAGNOSIS — Z4789 Encounter for other orthopedic aftercare: Secondary | ICD-10-CM | POA: Diagnosis not present

## 2020-05-20 DIAGNOSIS — I700 Atherosclerosis of aorta: Secondary | ICD-10-CM | POA: Diagnosis not present

## 2020-05-20 DIAGNOSIS — G9341 Metabolic encephalopathy: Secondary | ICD-10-CM | POA: Diagnosis not present

## 2020-05-20 DIAGNOSIS — G4489 Other headache syndrome: Secondary | ICD-10-CM | POA: Diagnosis not present

## 2020-05-20 DIAGNOSIS — F0280 Dementia in other diseases classified elsewhere without behavioral disturbance: Secondary | ICD-10-CM | POA: Diagnosis not present

## 2020-05-20 DIAGNOSIS — I480 Paroxysmal atrial fibrillation: Secondary | ICD-10-CM | POA: Diagnosis not present

## 2020-05-20 DIAGNOSIS — G319 Degenerative disease of nervous system, unspecified: Secondary | ICD-10-CM | POA: Diagnosis not present

## 2020-05-20 DIAGNOSIS — G4761 Periodic limb movement disorder: Secondary | ICD-10-CM | POA: Diagnosis not present

## 2020-05-20 DIAGNOSIS — I10 Essential (primary) hypertension: Secondary | ICD-10-CM | POA: Diagnosis not present

## 2020-05-21 ENCOUNTER — Encounter: Payer: Self-pay | Admitting: Podiatry

## 2020-05-21 ENCOUNTER — Telehealth: Payer: Self-pay

## 2020-05-21 ENCOUNTER — Other Ambulatory Visit: Payer: Self-pay | Admitting: Student

## 2020-05-21 DIAGNOSIS — M1990 Unspecified osteoarthritis, unspecified site: Secondary | ICD-10-CM | POA: Diagnosis not present

## 2020-05-21 DIAGNOSIS — M25561 Pain in right knee: Secondary | ICD-10-CM | POA: Diagnosis not present

## 2020-05-21 DIAGNOSIS — I10 Essential (primary) hypertension: Secondary | ICD-10-CM

## 2020-05-21 MED ORDER — SPIRONOLACTONE 25 MG PO TABS: 12.5000 mg | ORAL_TABLET | Freq: Every day | ORAL | 3 refills | Status: DC

## 2020-05-21 NOTE — Progress Notes (Signed)
Subjective:  [name], female    DOB: Feb 25, 1941,  MRN: [id number]  Chief Complaint  Patient presents with  . Routine Post Op    POV #3 DOS 03/18/2020 B/L MIDFOOT EXOSTECOTMY   "The right one is still so sore. The left one seems fine"    DOS: 03/04/2020 Procedure: Bilateral midfoot exostectomy  80 y.o. female returns for post-op check.  Patient is doing well.  She states that they have been doing Betadine wet to dressing changes to the right side.  The left side is doing really good.  Denies any other acute complaints  Review of Systems: Negative except as noted in the HPI. Denies N/V/F/Ch.  Past Medical History:  Diagnosis Date  . Acute blood loss anemia 10/27/2017  . Acute GI bleeding 10/27/2017  . Anemia   . Arthritis    "joints" (11/10/2017)  . Dizziness   . GERD (gastroesophageal reflux disease)   . GI bleed 11/06/2017  . Hearing loss   . History of blood transfusion 10/2017  . Hyperlipidemia   . Hyperthyroidism   . Melanoma ([place])    "cut off my back"  . Memory loss   . Sinus headache   . Sleep apnea     Current Outpatient Medications:  .  acetaminophen-codeine (TYLENOL #3) 300-30 MG tablet, Take 1-2 tablets by mouth every 4 (four) hours as needed for moderate pain., Disp: 30 tablet, Rfl: 0 .  Alpha-Lipoic Acid (LIPOIC ACID PO), Take 600 mg by mouth 2 (two) times daily., Disp: , Rfl:  .  atorvastatin (LIPITOR) 10 MG tablet, TAKE 1 TABLET BY MOUTH EVERY DAY (Patient taking differently: Take 10 mg by mouth at bedtime.), Disp: 90 tablet, Rfl: 3 .  Calcium Carbonate-Vitamin D3 600-400 MG-UNIT TABS, Take 1 tablet by mouth 2 (two) times a day. , Disp: , Rfl:  .  cholecalciferol (VITAMIN D) 1000 UNITS tablet, Take 1,000 Units by mouth daily., Disp: , Rfl:  .  cycloSPORINE (RESTASIS) 0.05 % ophthalmic emulsion, Place 1 drop into both eyes 2 (two) times daily. , Disp: , Rfl:  .  diltiazem (CARDIZEM CD) 120 MG 24 hr capsule, TAKE 1 CAPSULE BY MOUTH EVERY DAY  (Patient taking differently: Take 120 mg by mouth daily.), Disp: 90 capsule, Rfl: 1 .  DULoxetine (CYMBALTA) 30 MG capsule, TAKE 1 CAPSULE BY MOUTH TWICE A DAY (Patient taking differently: Take 30 mg by mouth 2 (two) times daily.), Disp: 180 capsule, Rfl: 3 .  ELIQUIS 5 MG TABS tablet, TAKE 1 TABLET BY MOUTH TWICE A DAY (Patient taking differently: Take 5 mg by mouth 2 (two) times daily.), Disp: 60 tablet, Rfl: 5 .  estradiol (CLIMARA - DOSED IN MG/24 HR) 0.025 mg/24hr patch, Place 0.025 mg onto the skin every Sunday., Disp: , Rfl:  .  fluticasone (FLONASE) 50 MCG/ACT nasal spray, Place 1 spray into the nose daily as needed for allergies. , Disp: , Rfl:  .  GLUCOSAMINE-CHONDROITIN PO, Take 2 tablets by mouth daily at 12 noon., Disp: , Rfl:  .  hydrochlorothiazide (HYDRODIURIL) 25 MG tablet, Take 25 mg by mouth daily., Disp: , Rfl:  .  ibuprofen (ADVIL) 200 MG tablet, Take 2 tablets (400 mg total) by mouth 2 (two) times daily as needed., Disp: 30 tablet, Rfl: 0 .  losartan (COZAAR) 100 MG tablet, Take 100 mg by mouth daily., Disp: , Rfl:  .  meclizine (ANTIVERT) 25 MG tablet, Take 25 mg by mouth daily as needed for dizziness., Disp: , Rfl:  .  Melatonin 5 MG CAPS, Take 5 mg by mouth at bedtime. , Disp: , Rfl:  .  Multiple Vitamin (MULTIVITAMIN WITH MINERALS) TABS tablet, Take 1 tablet by mouth daily., Disp: , Rfl:  .  nitrofurantoin (MACRODANTIN) 100 MG capsule, Take 100 mg by mouth daily., Disp: , Rfl:  .  nitroGLYCERIN (NITROLINGUAL) 0.4 MG/SPRAY spray, PLACE 1 SPRAY UNDER THE TONGUE EVERY 5 (FIVE) MINUTES FOR 3 DOSES AS NEEDED FOR CHEST PAIN (Patient taking differently: Place 1 spray under the tongue every 5 (five) minutes x 3 doses as needed for chest pain.), Disp: 12 g, Rfl: 1 .  Omega-3 1000 MG CAPS, Take 2,000 mg by mouth daily. , Disp: , Rfl:  .  pantoprazole (PROTONIX) 40 MG tablet, Take 40 mg by mouth daily before breakfast. , Disp: , Rfl: 1 .  QUEtiapine (SEROQUEL) 25 MG tablet, Take 1  tablet (25 mg total) by mouth at bedtime., Disp: , Rfl:  .  sertraline (ZOLOFT) 25 MG tablet, Take 1 tablet by mouth daily., Disp: , Rfl:  .  thyroid (ARMOUR) 60 MG tablet, Take 60 mg by mouth daily before breakfast., Disp: , Rfl:  .  tiZANidine (ZANAFLEX) 4 MG tablet, Take 1 tablet (4 mg total) by mouth 3 (three) times daily as needed. (Patient taking differently: Take 4 mg by mouth 3 (three) times daily as needed for muscle spasms.), Disp: 90 tablet, Rfl: 5 .  vitamin C (ASCORBIC ACID) 500 MG tablet, Take 500 mg by mouth daily. , Disp: , Rfl:   Social History   Tobacco Use  Smoking Status Former Smoker  . Years: 2.00  . Types: Cigarettes  . Quit date: 69  . Years since quitting: 42.1  Smokeless Tobacco Never Used  Tobacco Comment   11/10/2017 "only smoked when I was on my period"    Allergies  Allergen Reactions  . Avelox [Moxifloxacin Hcl In Nacl] Other (See Comments)    Unknown reaction  . Other Other (See Comments)    Other reaction(s): flu like symptoms  . Alendronate Sodium Nausea Only   Objective:  There were no vitals filed for this visit. There is no height or weight on file to calculate BMI. Constitutional Well developed. Well nourished.  Vascular Foot warm and well perfused. Capillary refill normal to all digits.   Neurologic Normal speech. Oriented to person, place, and time. Epicritic sensation to light touch grossly present bilaterally.  Dermatologic  right medial foot ulceration limited to the breakdown of the skin.  Does not probe down to bone.  No malodor present.  Beefy red/fibrotic granular tissue noted.  Orthopedic:  No tenderness to palpation noted about the surgical site.   Radiographs: Adequate resection noted.  3 views of skeletally mature adult bilateral foot: Adequate resection good correction alignment noted.  Of pes planovalgus foot deformity noted. Assessment:   1. Right foot ulcer, limited to breakdown of skin ([place])   2. Exostosis of right  foot   3. Exostosis of left foot   4. Status post foot surgery    Plan:  Patient was evaluated and treated and all questions answered.  Right foot ulcer limited to the breakdown of the skin -I explained the patient the etiology of ulceration versus treatment options were discussed.  Given that patient has a formed ulceration we will plan on doing local wound care to allow it to close up.  Continue doing Betadine wet-to-dry dressing changes as well as wearing surgical shoe to allow proper offloading.  Patient states understanding  and will do so.  Procedure: Excisional Debridement of Wound Tool: Sharp chisel blade/tissue nipper Rationale: Removal of non-viable soft tissue from the wound to promote healing.  Anesthesia: none Pre-Debridement Wound Measurements: 0.5 cm x 0.6 cm x 0.3 cm  Post-Debridement Wound Measurements: 0.6 cm x 0.7 cm x 0.2 cm  Type of Debridement: Sharp Excisional Tissue Removed: Non-viable soft tissue Blood loss: Minimal (<50cc) Depth of Debridement: subcutaneous tissue. Technique: Sharp excisional debridement to bleeding, viable wound base.  Wound Progress: The wound is decreasing in size. Dressing: Dry, sterile, compression dressing. Disposition: Patient tolerated procedure well. Patient to return in 1 week for follow-up.  No follow-ups on file.     Status post foot surgery bilaterally -Clinically healing well especially left side.  The left side has completely epithelialized.  The right side there is still a wound presentation that we will need some local wound care to allow it to close up.  I discussed that continue doing Betadine wet-to-dry dressing changes.  However left side can go back to regular shoes without any restrictions.  No follow-ups on file.

## 2020-05-21 NOTE — Progress Notes (Signed)
Hypertension remains uncontrolled, will add spironolactone 12.5 mg daily will recheck BMP in 7-10 days.

## 2020-05-21 NOTE — Telephone Encounter (Signed)
Loop recorder scheduled for Thursday 05/23/2020 @ 8am. Pt aware

## 2020-05-21 NOTE — Telephone Encounter (Signed)
I also called patient, left a voicemail to call the office back to discuss antihypertensive medications.

## 2020-05-22 DIAGNOSIS — Z4789 Encounter for other orthopedic aftercare: Secondary | ICD-10-CM | POA: Diagnosis not present

## 2020-05-22 DIAGNOSIS — G4761 Periodic limb movement disorder: Secondary | ICD-10-CM | POA: Diagnosis not present

## 2020-05-22 DIAGNOSIS — I10 Essential (primary) hypertension: Secondary | ICD-10-CM | POA: Diagnosis not present

## 2020-05-22 DIAGNOSIS — G319 Degenerative disease of nervous system, unspecified: Secondary | ICD-10-CM | POA: Diagnosis not present

## 2020-05-22 DIAGNOSIS — I700 Atherosclerosis of aorta: Secondary | ICD-10-CM | POA: Diagnosis not present

## 2020-05-22 DIAGNOSIS — F0280 Dementia in other diseases classified elsewhere without behavioral disturbance: Secondary | ICD-10-CM | POA: Diagnosis not present

## 2020-05-22 DIAGNOSIS — G4489 Other headache syndrome: Secondary | ICD-10-CM | POA: Diagnosis not present

## 2020-05-22 DIAGNOSIS — I480 Paroxysmal atrial fibrillation: Secondary | ICD-10-CM | POA: Diagnosis not present

## 2020-05-22 DIAGNOSIS — G9341 Metabolic encephalopathy: Secondary | ICD-10-CM | POA: Diagnosis not present

## 2020-05-23 ENCOUNTER — Encounter: Payer: Self-pay | Admitting: Cardiology

## 2020-05-23 ENCOUNTER — Other Ambulatory Visit: Payer: Self-pay | Admitting: Neurology

## 2020-05-23 ENCOUNTER — Other Ambulatory Visit: Payer: Self-pay

## 2020-05-23 ENCOUNTER — Ambulatory Visit: Payer: Medicare PPO | Admitting: Cardiology

## 2020-05-23 VITALS — BP 164/85 | HR 72 | Temp 98.2°F | Resp 16 | Ht 65.0 in | Wt 153.0 lb

## 2020-05-23 DIAGNOSIS — I480 Paroxysmal atrial fibrillation: Secondary | ICD-10-CM

## 2020-05-23 DIAGNOSIS — R55 Syncope and collapse: Secondary | ICD-10-CM

## 2020-05-23 DIAGNOSIS — Z9889 Other specified postprocedural states: Secondary | ICD-10-CM

## 2020-05-23 HISTORY — DX: Other specified postprocedural states: Z98.890

## 2020-05-23 NOTE — Patient Instructions (Addendum)
Implantable Loop Recorder Placement, Care After This sheet gives you information about how to care for yourself after your procedure. Your health care provider may also give you more specific instructions. If you have problems or questions, contact your health care provider. What can I expect after the procedure? After the procedure, it is common to have:  Soreness or discomfort near the incision.  Some swelling or bruising near the incision. Follow these instructions at home: Incision care  Follow instructions from your health care provider about how to take care of your incision. Make sure you: ? Wash your hands with soap and water before you change your bandage (dressing). If soap and water are not available, use hand sanitizer. ? Change your dressing as told by your health care provider. ? Keep your dressing dry. ? Leave stitches (sutures), skin glue, or adhesive strips in place. These skin closures may need to stay in place for 2 weeks or longer. If adhesive strip edges start to loosen and curl up, you may trim the loose edges. Do not remove adhesive strips completely unless your health care provider tells you to do that.  Check your incision area every day for signs of infection. Check for: ? Redness, swelling, or pain. ? Fluid or blood. ? Warmth. ? Pus or a bad smell.  Do not take baths, swim, or use a hot tub until your health care provider approves. Ask your health care provider if you can take showers.   Activity  Return to your normal activities as told by your health care provider. Ask your health care provider what activities are safe for you.  Do not drive for 24 hours if you were given a sedative during your procedure.   General instructions  Follow instructions from your health care provider about how to manage your implantable loop recorder and transmit the information. Learn how to activate a recording if this is necessary for your type of device.  Do not go through  a metal detection gate, and do not let someone hold a metal detector over your chest. Show your ID card.  Do not have an MRI unless you check with your health care provider first.  Take over-the-counter and prescription medicines only as told by your health care provider.  Keep all follow-up visits as told by your health care provider. This is important. Contact a health care provider if:  You have redness, swelling, or pain around your incision.  You have a fever.  You have pain that is not relieved by your pain medicine.  You have triggered your device because of fainting (syncope) or because of a heartbeat that feels like it is racing, slow, fluttering, or skipping (palpitations). Get help right away if you have:  Chest pain.  Difficulty breathing. Summary  After the procedure, it is common to have soreness or discomfort near the incision.  Change your dressing as told by your health care provider.  Follow instructions from your health care provider about how to manage your implantable loop recorder and transmit the information.  Keep all follow-up visits as told by your health care provider. This is important. This information is not intended to replace advice given to you by your health care provider. Make sure you discuss any questions you have with your health care provider. Document Revised: 05/15/2017 Document Reviewed: 05/15/2017 Elsevier Patient Education  2021 [employer].

## 2020-05-23 NOTE — Progress Notes (Signed)
Primary Physician/Referring:  Deland Pretty, MD  Patient ID: Tacey Heap, female    DOB: January 03, 1941, 80 y.o.   MRN: [id number]  Chief Complaint  Patient presents with  . Atrial Fibrillation  . Loss of Consciousness    Will do a loop recorder today   HPI:    Sunshine Mackowski  is a 80 y.o. Caucasian female with hypertension, hyperlipidemia, frequent UTI,  paroxysmal atrial fibrillation, atrial tachycardia, obstructive sleep apnea on CPAP.n the past she has had recurrent episodes of near syncope and severe orthostatic changes with recurrent UTI.  However patient with episodes of syncope and near syncope not associated with UTI.  Patient underwent surgery to her right foot 03/04/2020 and had an episode of syncope while at rehabilitation facility on 04/10/2020 which was unexplained.    She also has occasional brief episodes of dizziness associated with change in position.  This is significantly improved since being home and drinking Pedialyte on a daily basis. She now presents for loop recorder implantation to evaluate syncope.  Past Medical History:  Diagnosis Date  . Acute blood loss anemia 10/27/2017  . Acute GI bleeding 10/27/2017  . Anemia   . Arthritis    "joints" (11/10/2017)  . Dizziness   . GERD (gastroesophageal reflux disease)   . GI bleed 11/06/2017  . Hearing loss   . History of blood transfusion 10/2017  . Hyperlipidemia   . Hyperthyroidism   . Melanoma ([place])    "cut off my back"  . Memory loss   . Sinus headache   . Sleep apnea    Past Surgical History:  Procedure Laterality Date  . ABDOMINAL HYSTERECTOMY     "partial"  . APPENDECTOMY    . BIOPSY  11/17/2017   Procedure: BIOPSY;  Surgeon: Clarene Essex, MD;  Location: [facility];  Service: Endoscopy;;  . COLONOSCOPY WITH PROPOFOL N/A 11/17/2017   Procedure: COLONOSCOPY WITH PROPOFOL;  Surgeon: Clarene Essex, MD;  Location: [facility];  Service: Endoscopy;  Laterality: N/A;  . CRANIECTOMY FOR EXCISION OF ACOUSTIC  NEUROMA    . ESOPHAGOGASTRODUODENOSCOPY (EGD) WITH PROPOFOL N/A 10/28/2017   Procedure: ESOPHAGOGASTRODUODENOSCOPY (EGD) WITH PROPOFOL;  Surgeon: Clarene Essex, MD;  Location: [facility];  Service: Endoscopy;  Laterality: N/A;  . FOOT SURGERY Bilateral 04/18/2020  . KNEE ARTHROSCOPY Left   . LEFT HEART CATH AND CORONARY ANGIOGRAPHY N/A 11/24/2016   Procedure: LEFT HEART CATH AND CORONARY ANGIOGRAPHY;  Surgeon: Jettie Booze, MD;  Location: [facility];  Service: Cardiovascular;  Laterality: N/A;  . MELANOMA EXCISION     "off my back"  . NASAL SINUS SURGERY    . TONSILLECTOMY     Family History  Problem Relation Age of Onset  . Congestive Heart Failure Mother   . Heart disease Father        History of heart attacks at a later age.    Marland Kitchen Heart disease Brother   . Hearing loss Brother     Social History   Tobacco Use  . Smoking status: Former Smoker    Years: 2.00    Types: Cigarettes    Quit date: 1980    Years since quitting: 42.1  . Smokeless tobacco: Never Used  . Tobacco comment: 11/10/2017 "only smoked when I was on my period"  Substance Use Topics  . Alcohol use: Yes    Comment: 11/10/2017 "nothing lately; did have 1 glass of wine q night"  Marital Status: Married   ROS  Review of Systems  Cardiovascular: Positive for syncope. Negative for chest pain, dyspnea on exertion and leg swelling.  Gastrointestinal: Negative for melena.  Neurological: Positive for dizziness and loss of balance.   Objective  Blood pressure (!) 164/85, pulse 72, temperature 98.2 F (36.8 C), resp. rate 16, height '5\' 5"'  (1.651 m), weight 153 lb (69.4 kg), SpO2 97 %.  Vitals with BMI 05/23/2020 05/17/2020 05/17/2020  Height '5\' 5"'  - -  Weight 153 lbs - -  BMI 14.23 - -  Systolic 953 202 334  Diastolic 85 75 76  Pulse 72 82 75    Orthostatic VS for the past 72 hrs (Last 3 readings):  Patient Position BP Location Cuff Size  05/23/20 0809 Sitting Left Arm Large     Physical Exam Vitals  reviewed.  HENT:     Head: Normocephalic and atraumatic.  Cardiovascular:     Rate and Rhythm: Normal rate and regular rhythm.     Pulses: Intact distal pulses.     Heart sounds: Normal heart sounds, S1 normal and S2 normal. No murmur heard. No gallop.      Comments: No leg edema, no JVD. Pulmonary:     Effort: Pulmonary effort is normal. No respiratory distress.     Breath sounds: Normal breath sounds. No wheezing, rhonchi or rales.  Musculoskeletal:     Right lower leg: No edema.     Left lower leg: No edema.  Skin:    General: Skin is warm and dry.  Neurological:     General: No focal deficit present.     Mental Status: She is alert and oriented to person, place, and time.    Laboratory examination:   Recent Labs    06/30/19 1740 01/24/20 1159 03/25/20 1308 03/26/20 0659 04/10/20 1034  NA 136   < > 129* 132* 144  K 3.2*   < > 3.3* 3.4* 3.2*  CL 101   < > 93* 97* 107  CO2 25   < > '24 22 27  ' GLUCOSE 118*   < > 128* 114* 105*  BUN 16   < > '16 11 19  ' CREATININE 0.67   < > 0.68 0.57 0.66  CALCIUM 8.9   < > 9.1 9.9 9.3  GFRNONAA >60   < > >60 >60 >60  GFRAA >60  --   --   --   --    < > = values in this interval not displayed.   CrCl cannot be calculated (Patient's most recent lab result is older than the maximum 21 days allowed.).  CMP Latest Ref Rng & Units 04/10/2020 03/26/2020 03/25/2020  Glucose 70 - 99 mg/dL 105(H) 114(H) 128(H)  BUN 8 - 23 mg/dL '19 11 16  ' Creatinine 0.44 - 1.00 mg/dL 0.66 0.57 0.68  Sodium 135 - 145 mmol/L 144 132(L) 129(L)  Potassium 3.5 - 5.1 mmol/L 3.2(L) 3.4(L) 3.3(L)  Chloride 98 - 111 mmol/L 107 97(L) 93(L)  CO2 22 - 32 mmol/L '27 22 24  ' Calcium 8.9 - 10.3 mg/dL 9.3 9.9 9.1  Total Protein 6.5 - 8.1 g/dL 6.1(L) - -  Total Bilirubin 0.3 - 1.2 mg/dL 0.8 - -  Alkaline Phos 38 - 126 U/L 47 - -  AST 15 - 41 U/L 20 - -  ALT 0 - 44 U/L 19 - -   CBC Latest Ref Rng & Units 04/10/2020 03/25/2020 03/24/2020  WBC 4.0 - 10.5 K/uL 6.2 8.2 8.8   Hemoglobin 12.0 - 15.0 g/dL 11.8(L) 12.6 12.4  Hematocrit  36.0 - 46.0 % 35.7(L) 34.8(L) 35.8(L)  Platelets 150 - 400 K/uL 280 245 278   Lipid Panel     Component Value Date/Time   CHOL 165 11/24/2016 0040   TRIG 88 11/24/2016 0040   HDL 61 11/24/2016 0040   CHOLHDL 2.7 11/24/2016 0040   VLDL 18 11/24/2016 0040   LDLCALC 86 11/24/2016 0040   HEMOGLOBIN A1C Lab Results  Component Value Date   HGBA1C 5.6 11/24/2016   MPG 114 11/24/2016   TSH Recent Labs    03/21/20 1219  TSH 0.974    External labs:   Cholesterol, total 145.000 m 09/01/2019 HDL 67.000 mg 10/13/2019 LDL-C 94.000 cal 10/13/2019 Triglycerides 80.000 mg 10/13/2019  Hemoglobin 10.900 g/d 01/24/2020 INR 1.160 11/06/2017 Platelets 197.000 K/ 01/24/2020  Creatinine, Serum 0.580 mg/ 01/24/2020 Potassium 3.500 mm 01/24/2020 ALT (SGPT) 15.000 IU/ 09/01/2019  TSH 0.870 10/13/2019  Hemoglobin 9.300 12/14/2018;  INR 1.160 11/06/2017 Platelets 188.000 12/14/2018  Creatinine, Serum 0.970 12/14/2018 Potassium 4.000 12/14/2018 Magnesium N/D ALT (SGPT) 15.000 12/14/2018  Labs 11/02/2018: BUN 26, creatinine 0.9, EGFR greater than 60 mL, Hb 10.6/HCT 32.2, platelets 244.   Medications and allergies   Allergies  Allergen Reactions  . Flecainide Other (See Comments)    Severe dizziness  . Avelox [Moxifloxacin Hcl In Nacl] Other (See Comments)    Unknown reaction  . Other Other (See Comments)    Other reaction(s): flu like symptoms  . Alendronate Sodium Nausea Only    Current Outpatient Medications on File Prior to Visit  Medication Sig Dispense Refill  . acetaminophen-codeine (TYLENOL #3) 300-30 MG tablet Take 1-2 tablets by mouth every 4 (four) hours as needed for moderate pain. 30 tablet 0  . Alpha-Lipoic Acid (LIPOIC ACID PO) Take 600 mg by mouth daily.    Marland Kitchen atorvastatin (LIPITOR) 10 MG tablet TAKE 1 TABLET BY MOUTH EVERY DAY (Patient taking differently: Take 10 mg by mouth at bedtime.) 90 tablet 3  . Calcium  Carbonate-Vitamin D3 600-400 MG-UNIT TABS Take 1 tablet by mouth daily.    . cycloSPORINE (RESTASIS) 0.05 % ophthalmic emulsion Place 1 drop into both eyes 2 (two) times daily.     Marland Kitchen diltiazem (CARDIZEM CD) 120 MG 24 hr capsule TAKE 1 CAPSULE BY MOUTH EVERY DAY (Patient taking differently: Take 120 mg by mouth daily.) 90 capsule 1  . DULoxetine (CYMBALTA) 30 MG capsule TAKE 1 CAPSULE BY MOUTH TWICE A DAY (Patient taking differently: Take 30 mg by mouth 2 (two) times daily.) 180 capsule 3  . ELIQUIS 5 MG TABS tablet TAKE 1 TABLET BY MOUTH TWICE A DAY (Patient taking differently: Take 5 mg by mouth 2 (two) times daily.) 60 tablet 5  . fluticasone (FLONASE) 50 MCG/ACT nasal spray Place 1 spray into the nose daily as needed for allergies.     Marland Kitchen GLUCOSAMINE-CHONDROITIN PO Take 1 tablet by mouth daily at 12 noon.    . hydrochlorothiazide (HYDRODIURIL) 25 MG tablet Take 25 mg by mouth daily.    Marland Kitchen losartan (COZAAR) 100 MG tablet Take 100 mg by mouth daily.    . meclizine (ANTIVERT) 25 MG tablet Take 25 mg by mouth daily as needed for dizziness.    . Melatonin 5 MG CAPS Take 5 mg by mouth at bedtime.     . Multiple Vitamin (MULTIVITAMIN WITH MINERALS) TABS tablet Take 1 tablet by mouth daily.    . nitrofurantoin (MACRODANTIN) 100 MG capsule Take 100 mg by mouth daily.    . nitroGLYCERIN (NITROLINGUAL) 0.4 MG/SPRAY  spray PLACE 1 SPRAY UNDER THE TONGUE EVERY 5 (FIVE) MINUTES FOR 3 DOSES AS NEEDED FOR CHEST PAIN (Patient taking differently: Place 1 spray under the tongue every 5 (five) minutes x 3 doses as needed for chest pain.) 12 g 1  . Omega-3 1000 MG CAPS Take 2,000 mg by mouth daily.     . pantoprazole (PROTONIX) 40 MG tablet Take 40 mg by mouth daily before breakfast.   1  . spironolactone (ALDACTONE) 25 MG tablet Take 0.5 tablets (12.5 mg total) by mouth daily. 15 tablet 3  . thyroid (ARMOUR) 60 MG tablet Take 60 mg by mouth daily before breakfast.    . tiZANidine (ZANAFLEX) 4 MG tablet Take 1 tablet  (4 mg total) by mouth 3 (three) times daily as needed. (Patient taking differently: Take 4 mg by mouth 3 (three) times daily as needed for muscle spasms.) 90 tablet 5  . vitamin C (ASCORBIC ACID) 500 MG tablet Take 500 mg by mouth daily.      No current facility-administered medications on file prior to visit.    Radiology:   No results found.  Cardiac Studies:   Event Monitor for 30 days Start date 07/01/2018 for syncope : A. Fib one episode, no symptoms reported. No heart block or significant bradycardia  Carotid duplex 03/27/2018:  Right Carotid: Velocities in the right ICA are consistent with a 1-39% stenosis. Left Carotid: Velocities in the left ICA are consistent with a 1-39% stenosis. Vertebrals:  Bilateral vertebral arteries demonstrate antegrade flow. Subclavians: Normal flow hemodynamics were seen in bilateral subclavian arteries.   Event Monitor 30 days 11/11/2017: Predominant rhythm is normal sinus rhythm. Symptoms of fatigue reveals normal sinus rhythm. Asymptomatic atrial fibrillation and probable atypical atrial flutter noted on 11/13/2017, 11/18/2017 and 12/09/2017 at 3:00 AM. Occasional PACs. Arrhythmia/PVC burden 6%.   Echo 11/09/2017: Left ventricle: The cavity size was normal. Systolic function was   normal. The estimated ejection fraction was in the range of 60% to 65%. Wall motion was normal; there were no regional wall   motion abnormalities. The study is not technically sufficient to   allow evaluation of LV diastolic function.Pulmonary arteries: PA peak pressure: 32 mm Hg (S).   Coronary angiogram 11/24/2016:  Normal coronaries; tortuous LAD  EKG:   EKG 05/17/2020: Sinus rhythm at a rate of 60 bpm, left atrial enlargement.  Left axis, left anterior fascicular block.  Nonspecific T wave abnormality.  Compared to EKG 04/19/2020, incomplete right bundle branch block and poor progression not present.  EKG 04/19/2020: Sinus rhythm at a rate of 79 bpm, left atrial enlargement.   Left axis, left anterior fascicular block.  Incomplete right bundle branch block.  Poor R wave progression, cannot exclude anteroseptal infarct old.  Nonspecific T wave abnormality.  Compared to EKG 02/02/2020, no significant change.  EKG 02/02/2020: Sinus tachycardia at the rate of 103 bpm, left axis deviation, left anterior fascicular block.  Incomplete right bundle branch block.  Poor R wave progression, cannot exclude anteroseptal infarct old.  Nonspecific T abnormality.  No significant change from 06/12/2019.  EKG 06/20/2018: Atrial tachycardia at the rate of 133 bpm.  Left axis deviation, left anterior fascicular block.  Poor R-wave progression.  Incomplete right bundle branch block.  Assessment     ICD-10-CM   1. Syncope and collapse  R55   2. Paroxysmal atrial fibrillation (HCC)  I48.0     No orders of the defined types were placed in this encounter.   There are no discontinued  medications.   This patients CHA2DS2-VASc Score 4 (HTN, F, Age)and yearly risk of stroke 4.8%.   Recommendations:     Prodedure performed: Insertion of Biotronik Loop Recorder. Serial #[id number]  Indication:    ICD-10-CM   1. Syncope and collapse  R55   2. Paroxysmal atrial fibrillation (HCC)  I48.0      After obtaining informed consent, explaining the procedure to the patient, under sterile precautions, local anesthesia with 1% lidocaine with epinephrine was injected subcutaneously in the left parasternal region.  20 mL utilized.  A small nick was made in the left paraspinal region at the 3rd intercostal space. Biotronik loop recorder was inserted without any complications.  Patient tolerated the procedure well.  The incision was closed with 30 silk sutures.  There is no blood loss, no hematoma.  Written instrtuctions regarding wound care given to the patient.  Patient will return in 1 week for suture removal.  Device setting:  R wave amplitude 1.20 mV.  Programmed to AF detection to low.  HVR  160/min. Bradycardia 40 BPM Sudden rate drop 50 bpm Asystole 3 Sec   Adrian Prows, PA-C 05/23/2020, 8:30 PM Office: [phone]

## 2020-05-24 ENCOUNTER — Ambulatory Visit: Payer: Medicare PPO | Admitting: Cardiology

## 2020-05-26 ENCOUNTER — Other Ambulatory Visit: Payer: Self-pay | Admitting: Cardiology

## 2020-05-26 DIAGNOSIS — E782 Mixed hyperlipidemia: Secondary | ICD-10-CM

## 2020-05-27 DIAGNOSIS — G4761 Periodic limb movement disorder: Secondary | ICD-10-CM | POA: Diagnosis not present

## 2020-05-27 DIAGNOSIS — Z4789 Encounter for other orthopedic aftercare: Secondary | ICD-10-CM | POA: Diagnosis not present

## 2020-05-27 DIAGNOSIS — I10 Essential (primary) hypertension: Secondary | ICD-10-CM | POA: Diagnosis not present

## 2020-05-27 DIAGNOSIS — G9341 Metabolic encephalopathy: Secondary | ICD-10-CM | POA: Diagnosis not present

## 2020-05-27 DIAGNOSIS — I480 Paroxysmal atrial fibrillation: Secondary | ICD-10-CM | POA: Diagnosis not present

## 2020-05-27 DIAGNOSIS — I700 Atherosclerosis of aorta: Secondary | ICD-10-CM | POA: Diagnosis not present

## 2020-05-27 DIAGNOSIS — G4489 Other headache syndrome: Secondary | ICD-10-CM | POA: Diagnosis not present

## 2020-05-27 DIAGNOSIS — G319 Degenerative disease of nervous system, unspecified: Secondary | ICD-10-CM | POA: Diagnosis not present

## 2020-05-27 DIAGNOSIS — F0280 Dementia in other diseases classified elsewhere without behavioral disturbance: Secondary | ICD-10-CM | POA: Diagnosis not present

## 2020-05-28 ENCOUNTER — Telehealth: Payer: Self-pay

## 2020-05-28 NOTE — Telephone Encounter (Signed)
[place]  Manufacturer : [employer] Model # : [id number] SN : [id number] PID : [id number]

## 2020-05-29 DIAGNOSIS — F0280 Dementia in other diseases classified elsewhere without behavioral disturbance: Secondary | ICD-10-CM | POA: Diagnosis not present

## 2020-05-29 DIAGNOSIS — G4489 Other headache syndrome: Secondary | ICD-10-CM | POA: Diagnosis not present

## 2020-05-29 DIAGNOSIS — I700 Atherosclerosis of aorta: Secondary | ICD-10-CM | POA: Diagnosis not present

## 2020-05-29 DIAGNOSIS — I10 Essential (primary) hypertension: Secondary | ICD-10-CM | POA: Diagnosis not present

## 2020-05-29 DIAGNOSIS — M79672 Pain in left foot: Secondary | ICD-10-CM | POA: Diagnosis not present

## 2020-05-29 DIAGNOSIS — Z4789 Encounter for other orthopedic aftercare: Secondary | ICD-10-CM | POA: Diagnosis not present

## 2020-05-29 DIAGNOSIS — I480 Paroxysmal atrial fibrillation: Secondary | ICD-10-CM | POA: Diagnosis not present

## 2020-05-29 DIAGNOSIS — M79671 Pain in right foot: Secondary | ICD-10-CM | POA: Diagnosis not present

## 2020-05-29 DIAGNOSIS — R5382 Chronic fatigue, unspecified: Secondary | ICD-10-CM | POA: Diagnosis not present

## 2020-05-29 DIAGNOSIS — G9341 Metabolic encephalopathy: Secondary | ICD-10-CM | POA: Diagnosis not present

## 2020-05-29 DIAGNOSIS — G4761 Periodic limb movement disorder: Secondary | ICD-10-CM | POA: Diagnosis not present

## 2020-05-29 DIAGNOSIS — R5381 Other malaise: Secondary | ICD-10-CM | POA: Diagnosis not present

## 2020-05-29 DIAGNOSIS — G319 Degenerative disease of nervous system, unspecified: Secondary | ICD-10-CM | POA: Diagnosis not present

## 2020-05-30 ENCOUNTER — Other Ambulatory Visit: Payer: Self-pay

## 2020-05-30 ENCOUNTER — Ambulatory Visit: Payer: Medicare PPO | Admitting: Student

## 2020-05-30 DIAGNOSIS — I480 Paroxysmal atrial fibrillation: Secondary | ICD-10-CM

## 2020-05-30 DIAGNOSIS — I10 Essential (primary) hypertension: Secondary | ICD-10-CM

## 2020-05-30 DIAGNOSIS — E782 Mixed hyperlipidemia: Secondary | ICD-10-CM

## 2020-05-30 MED ORDER — SPIRONOLACTONE 25 MG PO TABS: 12.5000 mg | ORAL_TABLET | Freq: Every day | ORAL | 3 refills | Status: DC

## 2020-05-30 MED ORDER — ATORVASTATIN CALCIUM 10 MG PO TABS: 10.0000 mg | ORAL_TABLET | Freq: Every day | ORAL | 11 refills | Status: DC

## 2020-05-30 MED ORDER — APIXABAN 5 MG PO TABS: 5.0000 mg | ORAL_TABLET | Freq: Two times a day (BID) | ORAL | 5 refills | Status: DC

## 2020-05-30 MED ORDER — DILTIAZEM HCL ER COATED BEADS 120 MG PO CP24: 120.0000 mg | ORAL_CAPSULE | Freq: Every morning | ORAL | 1 refills | Status: DC

## 2020-05-30 NOTE — Progress Notes (Signed)
Patient here for suture removal. Patient underwent loop recorder implantation 7 days ago, which required closure with 3 sutures. Patient denies pain, redness, or drainage from the wound. The area was cleaned, suture was removed using aseptic technique, and implantation site was cleaned again. Informed patient to monitor the area for signs of infection and to let the office know if these develop, they expressed understanding.

## 2020-05-31 NOTE — Progress Notes (Signed)
    Subjective:    CC: R foot pain  I, Molly Weber, LAT, ATC, am serving as scribe for Dr. Lynne Leader.  HPI: Pt is a 80 y/o female presenting w/ R foot pain since surgery in Dec .  Of note, pt had a B midfoot exostectomy on 03/18/20 w/ Dr. Boneta Lucks and most recently followed up w/ him on 05/15/20 w/ reports of an ulcer that formed on her R foot. Pt this morning pt reports her L foot is hurting across dorsal aspect of MT heads. Pt is here for a second opinion because she feels the foot isn't healing as quickly. Of note, pt is wearing post-op walking shoulder on both feet.  Diagnostic testing: R foot XR- 05/15/20, 03/29/20  Pertinent review of Systems: No fevers or chills  Relevant historical information: Mild dementia.  Bilateral foot surgery   Objective:    Vitals:   06/03/20 0907  BP: 118/76  Pulse: 81  SpO2: 98%   General: Well Developed, well nourished, and in no acute distress.   MSK: Bilateral feet significant ankle pronation and pes planus.  Healing wound midfoot with callus formation. Pulses cap refill and sensation intact distally. Right foot is tender palpation at bony prominence midfoot left is nontender.  Lab and Radiology Results  X-ray images feet bilaterally obtained in podiatry May 15, 2020 personally and independently interpreted  No fractures.  Degenerative changes present.     Impression and Recommendations:    Assessment and Plan: 80 y.o. female with midfoot pain.  Fundamentally patient has excessive pressure on the bony prominence and midfoot secondary to her pes planus and ankle pronation.  She is currently using postoperative shoes following surgery 3 months ago.  I think in general this is all a good idea.  However she is still having trouble with excessive pressure.  I modified the insoles in the postoperative shoe to reduce pressure at the bony prominence and showed her and her husband how to modify subsequent shoes in the future.  Plan to  recheck in 2 to 4 weeks and will modify subsequent more than normal shoes.  She may benefit from custom orthotics for similar options.  Additionally I would recommend that she use a walker as well as this may help offload some of the pressure as well.  Marland Kitchen  PDMP not reviewed this encounter. No orders of the defined types were placed in this encounter.  No orders of the defined types were placed in this encounter.   Discussed warning signs or symptoms. Please see discharge instructions. Patient expresses understanding.   The above documentation has been reviewed and is accurate and complete Lynne Leader, M.D.

## 2020-06-03 ENCOUNTER — Other Ambulatory Visit: Payer: Self-pay

## 2020-06-03 ENCOUNTER — Ambulatory Visit: Payer: Self-pay

## 2020-06-03 ENCOUNTER — Ambulatory Visit (INDEPENDENT_AMBULATORY_CARE_PROVIDER_SITE_OTHER): Payer: Medicare PPO | Admitting: Family Medicine

## 2020-06-03 VITALS — BP 118/76 | HR 81 | Ht 65.0 in | Wt 145.2 lb

## 2020-06-03 DIAGNOSIS — M25571 Pain in right ankle and joints of right foot: Secondary | ICD-10-CM

## 2020-06-03 NOTE — Patient Instructions (Addendum)
Thank you for coming in today.  Please complete the exercises that the athletic trainer went over with you: View at [email or web address] using code: [email or web address]  Lets off load the pressure on the nodule.   Work on the exercises on the post tibials tendon.   Recheck in 2-4 weeks.

## 2020-06-06 DIAGNOSIS — F0280 Dementia in other diseases classified elsewhere without behavioral disturbance: Secondary | ICD-10-CM | POA: Diagnosis not present

## 2020-06-06 DIAGNOSIS — G4761 Periodic limb movement disorder: Secondary | ICD-10-CM | POA: Diagnosis not present

## 2020-06-06 DIAGNOSIS — I10 Essential (primary) hypertension: Secondary | ICD-10-CM | POA: Diagnosis not present

## 2020-06-06 DIAGNOSIS — G319 Degenerative disease of nervous system, unspecified: Secondary | ICD-10-CM | POA: Diagnosis not present

## 2020-06-06 DIAGNOSIS — I700 Atherosclerosis of aorta: Secondary | ICD-10-CM | POA: Diagnosis not present

## 2020-06-06 DIAGNOSIS — G4489 Other headache syndrome: Secondary | ICD-10-CM | POA: Diagnosis not present

## 2020-06-06 DIAGNOSIS — I480 Paroxysmal atrial fibrillation: Secondary | ICD-10-CM | POA: Diagnosis not present

## 2020-06-06 DIAGNOSIS — Z4789 Encounter for other orthopedic aftercare: Secondary | ICD-10-CM | POA: Diagnosis not present

## 2020-06-06 DIAGNOSIS — G9341 Metabolic encephalopathy: Secondary | ICD-10-CM | POA: Diagnosis not present

## 2020-06-09 ENCOUNTER — Encounter: Payer: Self-pay | Admitting: Cardiology

## 2020-06-09 ENCOUNTER — Telehealth: Payer: Self-pay | Admitting: Cardiology

## 2020-06-09 DIAGNOSIS — Z4509 Encounter for adjustment and management of other cardiac device: Secondary | ICD-10-CM

## 2020-06-09 HISTORY — DX: Encounter for adjustment and management of other cardiac device: Z45.09

## 2020-06-11 NOTE — Telephone Encounter (Signed)
Tried calling patient, someone picked up, then hung up.

## 2020-06-13 DIAGNOSIS — I700 Atherosclerosis of aorta: Secondary | ICD-10-CM | POA: Diagnosis not present

## 2020-06-13 DIAGNOSIS — I480 Paroxysmal atrial fibrillation: Secondary | ICD-10-CM | POA: Diagnosis not present

## 2020-06-13 DIAGNOSIS — I10 Essential (primary) hypertension: Secondary | ICD-10-CM | POA: Diagnosis not present

## 2020-06-13 DIAGNOSIS — F0280 Dementia in other diseases classified elsewhere without behavioral disturbance: Secondary | ICD-10-CM | POA: Diagnosis not present

## 2020-06-13 DIAGNOSIS — R2689 Other abnormalities of gait and mobility: Secondary | ICD-10-CM | POA: Diagnosis not present

## 2020-06-13 DIAGNOSIS — Z4789 Encounter for other orthopedic aftercare: Secondary | ICD-10-CM | POA: Diagnosis not present

## 2020-06-13 DIAGNOSIS — G319 Degenerative disease of nervous system, unspecified: Secondary | ICD-10-CM | POA: Diagnosis not present

## 2020-06-13 DIAGNOSIS — S0990XA Unspecified injury of head, initial encounter: Secondary | ICD-10-CM | POA: Diagnosis not present

## 2020-06-13 DIAGNOSIS — N390 Urinary tract infection, site not specified: Secondary | ICD-10-CM | POA: Diagnosis not present

## 2020-06-13 DIAGNOSIS — G4761 Periodic limb movement disorder: Secondary | ICD-10-CM | POA: Diagnosis not present

## 2020-06-13 DIAGNOSIS — N8110 Cystocele, unspecified: Secondary | ICD-10-CM | POA: Diagnosis not present

## 2020-06-13 DIAGNOSIS — G4489 Other headache syndrome: Secondary | ICD-10-CM | POA: Diagnosis not present

## 2020-06-13 DIAGNOSIS — R296 Repeated falls: Secondary | ICD-10-CM | POA: Diagnosis not present

## 2020-06-13 DIAGNOSIS — G9341 Metabolic encephalopathy: Secondary | ICD-10-CM | POA: Diagnosis not present

## 2020-06-14 ENCOUNTER — Ambulatory Visit (INDEPENDENT_AMBULATORY_CARE_PROVIDER_SITE_OTHER): Payer: Medicare PPO | Admitting: Podiatry

## 2020-06-14 ENCOUNTER — Other Ambulatory Visit: Payer: Self-pay

## 2020-06-14 DIAGNOSIS — S0990XA Unspecified injury of head, initial encounter: Secondary | ICD-10-CM | POA: Diagnosis not present

## 2020-06-14 DIAGNOSIS — Z9889 Other specified postprocedural states: Secondary | ICD-10-CM

## 2020-06-14 DIAGNOSIS — M898X7 Other specified disorders of bone, ankle and foot: Secondary | ICD-10-CM

## 2020-06-14 NOTE — Telephone Encounter (Signed)
Called patient, regarding her loop transmission results.

## 2020-06-18 ENCOUNTER — Encounter: Payer: Self-pay | Admitting: Podiatry

## 2020-06-18 ENCOUNTER — Other Ambulatory Visit: Payer: Self-pay | Admitting: Student

## 2020-06-18 ENCOUNTER — Other Ambulatory Visit: Payer: Self-pay | Admitting: Neurology

## 2020-06-18 DIAGNOSIS — I10 Essential (primary) hypertension: Secondary | ICD-10-CM

## 2020-06-18 NOTE — Progress Notes (Signed)
Subjective:  [name], female    DOB: Mar 18, 1941,  MRN: [id number]  Chief Complaint  Patient presents with  . Routine Post Op    POST OP DOS 12.6.21 PT requested to have her nails trimmed    DOS: 03/04/2020 Procedure: Bilateral midfoot exostectomy  80 y.o. female returns for post-op check.  Patient is doing well.  She states that they have been doing Betadine wet to dressing changes to the right side.  The left side is doing really good.  Denies any other acute complaints  Review of Systems: Negative except as noted in the HPI. Denies N/V/F/Ch.  Past Medical History:  Diagnosis Date  . Acute blood loss anemia 10/27/2017  . Acute GI bleeding 10/27/2017  . Anemia   . Arthritis    "joints" (11/10/2017)  . Dizziness   . Encounter for loop recorder check 06/09/2020  . GERD (gastroesophageal reflux disease)   . GI bleed 11/06/2017  . Hearing loss   . History of blood transfusion 10/2017  . Hyperlipidemia   . Hyperthyroidism   . Loop recorder Biotronik Loop Recorder 05/23/2020 05/23/2020  . Melanoma ([place])    "cut off my back"  . Memory loss   . Sinus headache   . Sleep apnea   . Syncope and collapse     Current Outpatient Medications:  .  acetaminophen-codeine (TYLENOL #3) 300-30 MG tablet, Take 1-2 tablets by mouth every 4 (four) hours as needed for moderate pain. (Patient not taking: Reported on 06/03/2020), Disp: 30 tablet, Rfl: 0 .  Alpha-Lipoic Acid (LIPOIC ACID PO), Take 600 mg by mouth daily., Disp: , Rfl:  .  apixaban (ELIQUIS) 5 MG TABS tablet, Take 1 tablet (5 mg total) by mouth 2 (two) times daily. With breakfast and dinner, Disp: 60 tablet, Rfl: 5 .  atorvastatin (LIPITOR) 10 MG tablet, Take 1 tablet (10 mg total) by mouth daily. At night, Disp: 30 tablet, Rfl: 11 .  Calcium Carbonate-Vitamin D3 600-400 MG-UNIT TABS, Take 1 tablet by mouth daily., Disp: , Rfl:  .  cycloSPORINE (RESTASIS) 0.05 % ophthalmic emulsion, Place 1 drop into both eyes 2 (two)  times daily. , Disp: , Rfl:  .  diltiazem (CARDIZEM CD) 120 MG 24 hr capsule, Take 1 capsule (120 mg total) by mouth in the morning., Disp: 90 capsule, Rfl: 1 .  DULoxetine (CYMBALTA) 30 MG capsule, TAKE 1 CAPSULE BY MOUTH TWICE A DAY (Patient taking differently: Take 30 mg by mouth 2 (two) times daily.), Disp: 180 capsule, Rfl: 3 .  fluticasone (FLONASE) 50 MCG/ACT nasal spray, Place 1 spray into the nose daily as needed for allergies. , Disp: , Rfl:  .  GLUCOSAMINE-CHONDROITIN PO, Take 1 tablet by mouth daily at 12 noon., Disp: , Rfl:  .  hydrochlorothiazide (HYDRODIURIL) 25 MG tablet, Take 25 mg by mouth in the morning., Disp: , Rfl:  .  losartan (COZAAR) 100 MG tablet, Take 100 mg by mouth every evening., Disp: , Rfl:  .  meclizine (ANTIVERT) 25 MG tablet, Take 25 mg by mouth daily as needed for dizziness., Disp: , Rfl:  .  Melatonin 5 MG CAPS, Take 5 mg by mouth at bedtime. , Disp: , Rfl:  .  Multiple Vitamin (MULTIVITAMIN WITH MINERALS) TABS tablet, Take 1 tablet by mouth daily., Disp: , Rfl:  .  nitrofurantoin (MACRODANTIN) 100 MG capsule, Take 100 mg by mouth daily., Disp: , Rfl:  .  nitroGLYCERIN (NITROLINGUAL) 0.4 MG/SPRAY spray, PLACE 1 SPRAY UNDER THE TONGUE  EVERY 5 (FIVE) MINUTES FOR 3 DOSES AS NEEDED FOR CHEST PAIN (Patient taking differently: Place 1 spray under the tongue every 5 (five) minutes x 3 doses as needed for chest pain.), Disp: 12 g, Rfl: 1 .  Omega-3 1000 MG CAPS, Take 2,000 mg by mouth daily. , Disp: , Rfl:  .  pantoprazole (PROTONIX) 40 MG tablet, Take 40 mg by mouth daily before breakfast. , Disp: , Rfl: 1 .  spironolactone (ALDACTONE) 25 MG tablet, TAKE 1/2 TABLET BY MOUTH EVERY DAY, Disp: 45 tablet, Rfl: 1 .  thyroid (ARMOUR) 60 MG tablet, Take 60 mg by mouth daily before breakfast., Disp: , Rfl:  .  tiZANidine (ZANAFLEX) 4 MG tablet, Take 1 tablet (4 mg total) by mouth 3 (three) times daily as needed. (Patient taking differently: Take 4 mg by mouth 3 (three) times  daily as needed for muscle spasms.), Disp: 90 tablet, Rfl: 5 .  vitamin C (ASCORBIC ACID) 500 MG tablet, Take 500 mg by mouth daily. , Disp: , Rfl:   Social History   Tobacco Use  Smoking Status Former Smoker  . Years: 2.00  . Types: Cigarettes  . Quit date: 24  . Years since quitting: 42.2  Smokeless Tobacco Never Used  Tobacco Comment   11/10/2017 "only smoked when I was on my period"    Allergies  Allergen Reactions  . Flecainide Other (See Comments)    Severe dizziness  . Avelox [Moxifloxacin Hcl In Nacl] Other (See Comments)    Unknown reaction  . Other Other (See Comments)    Other reaction(s): flu like symptoms  . Oxycodone Other (See Comments)    uphoria  . Alendronate Sodium Nausea Only   Objective:  There were no vitals filed for this visit. There is no height or weight on file to calculate BMI. Constitutional Well developed. Well nourished.  Vascular Foot warm and well perfused. Capillary refill normal to all digits.   Neurologic Normal speech. Oriented to person, place, and time. Epicritic sensation to light touch grossly present bilaterally.  Dermatologic  right medial foot ulceration limited to the breakdown of the skin.  Does not probe down to bone.  No malodor present.  Beefy red/fibrotic granular tissue noted.  Orthopedic:  No tenderness to palpation noted about the surgical site.   Radiographs: Adequate resection noted.  3 views of skeletally mature adult bilateral foot: Adequate resection good correction alignment noted.  Of pes planovalgus foot deformity noted. Assessment:   1. Exostosis of right foot   2. Status post foot surgery   3. Exostosis of left foot    Plan:  Patient was evaluated and treated and all questions answered.  Right foot ulcer limited to the breakdown of the skin -Clinically healed.  Skin completely epithelialized.  At this time I have asked her to resume normal activities and will continue monitor the recurrence of any  ulceration or hyperkeratotic lesion that she was previously dealing with. No follow-ups on file.     Status post foot surgery bilaterally -Clinically healing well especially left side.  Both sides have completely reepithelialized and healed well.  I have asked him if any foot and ankle issues arise in the future come back and see me.  Patient is officially discharged from my care.  No follow-ups on file.

## 2020-06-20 DIAGNOSIS — Z4789 Encounter for other orthopedic aftercare: Secondary | ICD-10-CM | POA: Diagnosis not present

## 2020-06-20 DIAGNOSIS — I700 Atherosclerosis of aorta: Secondary | ICD-10-CM | POA: Diagnosis not present

## 2020-06-20 DIAGNOSIS — G4761 Periodic limb movement disorder: Secondary | ICD-10-CM | POA: Diagnosis not present

## 2020-06-20 DIAGNOSIS — G9341 Metabolic encephalopathy: Secondary | ICD-10-CM | POA: Diagnosis not present

## 2020-06-20 DIAGNOSIS — I10 Essential (primary) hypertension: Secondary | ICD-10-CM | POA: Diagnosis not present

## 2020-06-20 DIAGNOSIS — I480 Paroxysmal atrial fibrillation: Secondary | ICD-10-CM | POA: Diagnosis not present

## 2020-06-20 DIAGNOSIS — F0280 Dementia in other diseases classified elsewhere without behavioral disturbance: Secondary | ICD-10-CM | POA: Diagnosis not present

## 2020-06-20 DIAGNOSIS — G4489 Other headache syndrome: Secondary | ICD-10-CM | POA: Diagnosis not present

## 2020-06-20 DIAGNOSIS — G319 Degenerative disease of nervous system, unspecified: Secondary | ICD-10-CM | POA: Diagnosis not present

## 2020-06-21 NOTE — Progress Notes (Signed)
   I, Wendy Poet, LAT, ATC, am serving as scribe for Dr. Lynne Leader.  Cynthia [name] is a 80 y.o. female who presents to [place] at [facility] today for f/u of R foot pain.  She was last seen by Dr. Georgina Snell on 06/03/20 and noted the formation of an ulcer on her R foot after having a B midfoot exostectomy on 03/18/20 w/ Dr. Boneta Lucks.  She was advised to con't using her B post-op shoes and Dr. Georgina Snell modified the insoles for her shoes.  She was also advised that she may need to get custom insoles in her shoes.  She was shown a HEP focusing on her post tib.  She followed-up w/ Dr. Posey Pronto on 06/14/20.  Since her last visit w/ Dr. Georgina Snell, pt reports that her R foot sore/ulcer is getting much better.  She states that she does con't to get chafing at her R medial 1st MTPJ.  She is to f/u w/ Dr. Posey Pronto in approximately 3 months.  She has not been doing her HEP as advised at her last visit.  Diagnostic testing: R foot XR- 05/15/20, 03/29/20; L foot XR- 03/29/20  Pertinent review of systems: No fevers or chills  Relevant historical information: Multiple medical problems including GERD and sleep apnea and A. fib   Exam:  BP 122/76 (BP Location: Right Arm, Patient Position: Sitting, Cuff Size: Normal)   Pulse 77   Ht 5\' 5"  (1.651 m)   Wt 146 lb 3.2 oz (66.3 kg)   SpO2 97%   BMI 24.33 kg/m  General: Well Developed, well nourished, and in no acute distress.   MSK: Right foot: Mature scar overlying navicular prominence.  Significant pronation.  Nontender.    Assessment and Plan: 80 y.o. female with right foot pain improving.  Patient has significant insufficiency of posterior tibialis tendon.  She should benefit from some physical therapy as well in addition to arch support and custom orthotics.  Plan for referral to PT and recheck in 3 months.   PDMP not reviewed this encounter. Orders Placed This Encounter  Procedures  . Ambulatory referral to Physical Therapy    Referral  Priority:   Routine    Referral Type:   Physical Medicine    Referral Reason:   Specialty Services Required    Requested Specialty:   Physical Therapy   No orders of the defined types were placed in this encounter.    Discussed warning signs or symptoms. Please see discharge instructions. Patient expresses understanding.   The above documentation has been reviewed and is accurate and complete Lynne Leader, M.D.

## 2020-06-23 DIAGNOSIS — Z4509 Encounter for adjustment and management of other cardiac device: Secondary | ICD-10-CM | POA: Diagnosis not present

## 2020-06-23 DIAGNOSIS — Z95818 Presence of other cardiac implants and grafts: Secondary | ICD-10-CM | POA: Diagnosis not present

## 2020-06-23 DIAGNOSIS — R55 Syncope and collapse: Secondary | ICD-10-CM | POA: Diagnosis not present

## 2020-06-24 ENCOUNTER — Encounter: Payer: Self-pay | Admitting: Family Medicine

## 2020-06-24 ENCOUNTER — Other Ambulatory Visit: Payer: Self-pay | Admitting: Cardiology

## 2020-06-24 ENCOUNTER — Other Ambulatory Visit: Payer: Self-pay

## 2020-06-24 ENCOUNTER — Ambulatory Visit (INDEPENDENT_AMBULATORY_CARE_PROVIDER_SITE_OTHER): Payer: Medicare PPO | Admitting: Family Medicine

## 2020-06-24 VITALS — BP 122/76 | HR 77 | Ht 65.0 in | Wt 146.2 lb

## 2020-06-24 DIAGNOSIS — M25571 Pain in right ankle and joints of right foot: Secondary | ICD-10-CM | POA: Diagnosis not present

## 2020-06-24 NOTE — Patient Instructions (Signed)
Thank you for coming in today.  I've referred you to Physical Therapy.  Let us know if you don't hear from them in one week.  Continue comfortable shoes.   Recheck in 3 month or as needed.

## 2020-06-26 DIAGNOSIS — M545 Low back pain, unspecified: Secondary | ICD-10-CM | POA: Diagnosis not present

## 2020-06-26 DIAGNOSIS — R102 Pelvic and perineal pain: Secondary | ICD-10-CM | POA: Diagnosis not present

## 2020-06-26 DIAGNOSIS — M533 Sacrococcygeal disorders, not elsewhere classified: Secondary | ICD-10-CM | POA: Diagnosis not present

## 2020-06-27 DIAGNOSIS — G4761 Periodic limb movement disorder: Secondary | ICD-10-CM | POA: Diagnosis not present

## 2020-06-27 DIAGNOSIS — G4489 Other headache syndrome: Secondary | ICD-10-CM | POA: Diagnosis not present

## 2020-06-27 DIAGNOSIS — G9341 Metabolic encephalopathy: Secondary | ICD-10-CM | POA: Diagnosis not present

## 2020-06-27 DIAGNOSIS — I700 Atherosclerosis of aorta: Secondary | ICD-10-CM | POA: Diagnosis not present

## 2020-06-27 DIAGNOSIS — I10 Essential (primary) hypertension: Secondary | ICD-10-CM | POA: Diagnosis not present

## 2020-06-27 DIAGNOSIS — G319 Degenerative disease of nervous system, unspecified: Secondary | ICD-10-CM | POA: Diagnosis not present

## 2020-06-27 DIAGNOSIS — I480 Paroxysmal atrial fibrillation: Secondary | ICD-10-CM | POA: Diagnosis not present

## 2020-06-27 DIAGNOSIS — Z4789 Encounter for other orthopedic aftercare: Secondary | ICD-10-CM | POA: Diagnosis not present

## 2020-06-27 DIAGNOSIS — F0280 Dementia in other diseases classified elsewhere without behavioral disturbance: Secondary | ICD-10-CM | POA: Diagnosis not present

## 2020-06-27 NOTE — Progress Notes (Signed)
Primary Physician/Referring:  Deland Pretty, MD  [name], female    DOB: January 20, 1941, 80 y.o.   MRN: [id number]  Chief Complaint  Patient presents with  . Hypertension  . Follow-up   HPI:    Cynthia [name]  is a 80 y.o. Caucasian female with hypertension, hyperlipidemia, frequent UTI,  paroxysmal atrial fibrillation, atrial tachycardia, obstructive sleep apnea on CPAP. Tolerating anticoagulation well. Flecainide discontinued due to dizziness. In the past she has had recurrent episodes of near syncope and severe orthostatic changes with recurrent UTI.  However patient with episodes of syncope and near syncope not associated with UTI.  Patient underwent surgery to her right foot 03/04/2020 and had an episode of syncope while at rehabilitation facility on 04/10/2020.   Patient presents for follow-up of hypertension, last visit added spironolactone 12.5 mg daily, however patient has not started this medication for an unknown reason.  Her blood pressure however today in the office is well controlled, despite not starting spironolactone.  She is accompanied by her husband at bedside, their primary concerns today for patient regarding short-term memory loss and physical deconditioning.  Patient has recently been seen by Dr. Georgina Snell, sports medicine, who had referred her to physical therapy to assist with muscle weakness and imbalance.  However they have not heard regarding scheduling physical therapy.  Patient also follows with Dr. Felecia Shelling neurology, has an upcoming appointment next month.  She has previously been evaluated by neurosurgery for shunt placement, however surgeon recommended holding off surgical intervention at this time.  Patient has had no recurrence of syncope or near syncope.  She is tolerating anticoagulation without bleeding diathesis.  There have been no alerts on her loop recorder for atrial fibrillation.    Past Medical History:  Diagnosis Date  . Acute blood loss  anemia 10/27/2017  . Acute GI bleeding 10/27/2017  . Anemia   . Arthritis    "joints" (11/10/2017)  . Dizziness   . Encounter for loop recorder check 06/09/2020  . GERD (gastroesophageal reflux disease)   . GI bleed 11/06/2017  . Hearing loss   . History of blood transfusion 10/2017  . Hyperlipidemia   . Hyperthyroidism   . Loop recorder Biotronik Loop Recorder 05/23/2020 05/23/2020  . Melanoma ([place])    "cut off my back"  . Memory loss   . Sinus headache   . Sleep apnea   . Syncope and collapse    Past Surgical History:  Procedure Laterality Date  . ABDOMINAL HYSTERECTOMY     "partial"  . APPENDECTOMY    . BIOPSY  11/17/2017   Procedure: BIOPSY;  Surgeon: Clarene Essex, MD;  Location: [facility];  Service: Endoscopy;;  . COLONOSCOPY WITH PROPOFOL N/A 11/17/2017   Procedure: COLONOSCOPY WITH PROPOFOL;  Surgeon: Clarene Essex, MD;  Location: [facility];  Service: Endoscopy;  Laterality: N/A;  . CRANIECTOMY FOR EXCISION OF ACOUSTIC NEUROMA    . ESOPHAGOGASTRODUODENOSCOPY (EGD) WITH PROPOFOL N/A 10/28/2017   Procedure: ESOPHAGOGASTRODUODENOSCOPY (EGD) WITH PROPOFOL;  Surgeon: Clarene Essex, MD;  Location: [facility];  Service: Endoscopy;  Laterality: N/A;  . FOOT SURGERY Bilateral 04/18/2020  . KNEE ARTHROSCOPY Left   . LEFT HEART CATH AND CORONARY ANGIOGRAPHY N/A 11/24/2016   Procedure: LEFT HEART CATH AND CORONARY ANGIOGRAPHY;  Surgeon: Jettie Booze, MD;  Location: [facility];  Service: Cardiovascular;  Laterality: N/A;  . MELANOMA EXCISION     "off my back"  . NASAL SINUS SURGERY    . TONSILLECTOMY  Family History  Problem Relation Age of Onset  . Congestive Heart Failure Mother   . Heart disease Father        History of heart attacks at a later age.    Marland Kitchen Heart disease Brother   . Hearing loss Brother     Social History   Tobacco Use  . Smoking status: Former Smoker    Years: 2.00    Types: Cigarettes    Quit date: 1980    Years since quitting: 42.2  .  Smokeless tobacco: Never Used  . Tobacco comment: 11/10/2017 "only smoked when I was on my period"  Substance Use Topics  . Alcohol use: Yes    Comment: 11/10/2017 "nothing lately; did have 1 glass of wine q night"  Marital Status: Married   ROS  Review of Systems  Constitutional: Negative for malaise/fatigue and weight gain.  Cardiovascular: Negative for chest pain, claudication, dyspnea on exertion, leg swelling, near-syncope, orthopnea, palpitations, paroxysmal nocturnal dyspnea and syncope.  Respiratory: Negative for shortness of breath.   Hematologic/Lymphatic: Does not bruise/bleed easily.  Gastrointestinal: Negative for melena.  Neurological: Positive for dizziness and light-headedness. Negative for weakness.   Objective  Blood pressure 127/66, pulse 77, temperature 98 F (36.7 C), height '5\' 5"'  (1.651 m), weight 141 lb (64 kg), SpO2 97 %.  Vitals with BMI 07/01/2020 06/24/2020 06/03/2020  Height '5\' 5"'  '5\' 5"'  '5\' 5"'   Weight 141 lbs 146 lbs 3 oz 145 lbs 3 oz  BMI 23.46 56.38 93.73  Systolic 428 768 115  Diastolic 66 76 76  Pulse 77 77 81    Orthostatic VS for the past 72 hrs (Last 3 readings):  Patient Position BP Location Cuff Size  07/01/20 0858 Sitting Left Arm Normal     Physical Exam Vitals reviewed.  HENT:     Head: Normocephalic and atraumatic.  Cardiovascular:     Rate and Rhythm: Normal rate and regular rhythm.     Pulses: Intact distal pulses.     Heart sounds: Normal heart sounds, S1 normal and S2 normal. No murmur heard. No gallop.      Comments: No leg edema, no JVD. Pulmonary:     Effort: Pulmonary effort is normal. No respiratory distress.     Breath sounds: Normal breath sounds. No wheezing, rhonchi or rales.  Musculoskeletal:     Right lower leg: No edema.     Left lower leg: No edema.  Skin:    General: Skin is warm and dry.  Neurological:     Mental Status: She is alert.    Laboratory examination:   Recent Labs    03/25/20 1308 03/26/20 0659  04/10/20 1034  NA 129* 132* 144  K 3.3* 3.4* 3.2*  CL 93* 97* 107  CO2 '24 22 27  ' GLUCOSE 128* 114* 105*  BUN '16 11 19  ' CREATININE 0.68 0.57 0.66  CALCIUM 9.1 9.9 9.3  GFRNONAA >60 >60 >60   CrCl cannot be calculated (Patient's most recent lab result is older than the maximum 21 days allowed.).  CMP Latest Ref Rng & Units 04/10/2020 03/26/2020 03/25/2020  Glucose 70 - 99 mg/dL 105(H) 114(H) 128(H)  BUN 8 - 23 mg/dL '19 11 16  ' Creatinine 0.44 - 1.00 mg/dL 0.66 0.57 0.68  Sodium 135 - 145 mmol/L 144 132(L) 129(L)  Potassium 3.5 - 5.1 mmol/L 3.2(L) 3.4(L) 3.3(L)  Chloride 98 - 111 mmol/L 107 97(L) 93(L)  CO2 22 - 32 mmol/L '27 22 24  ' Calcium 8.9 - 10.3  mg/dL 9.3 9.9 9.1  Total Protein 6.5 - 8.1 g/dL 6.1(L) - -  Total Bilirubin 0.3 - 1.2 mg/dL 0.8 - -  Alkaline Phos 38 - 126 U/L 47 - -  AST 15 - 41 U/L 20 - -  ALT 0 - 44 U/L 19 - -   CBC Latest Ref Rng & Units 04/10/2020 03/25/2020 03/24/2020  WBC 4.0 - 10.5 K/uL 6.2 8.2 8.8  Hemoglobin 12.0 - 15.0 g/dL 11.8(L) 12.6 12.4  Hematocrit 36.0 - 46.0 % 35.7(L) 34.8(L) 35.8(L)  Platelets 150 - 400 K/uL 280 245 278   Lipid Panel     Component Value Date/Time   CHOL 165 11/24/2016 0040   TRIG 88 11/24/2016 0040   HDL 61 11/24/2016 0040   CHOLHDL 2.7 11/24/2016 0040   VLDL 18 11/24/2016 0040   LDLCALC 86 11/24/2016 0040   HEMOGLOBIN A1C Lab Results  Component Value Date   HGBA1C 5.6 11/24/2016   MPG 114 11/24/2016   TSH Recent Labs    03/21/20 1219  TSH 0.974    External labs:   Cholesterol, total 145.000 m 09/01/2019 HDL 67.000 mg 10/13/2019 LDL-C 94.000 cal 10/13/2019 Triglycerides 80.000 mg 10/13/2019  Hemoglobin 10.900 g/d 01/24/2020 INR 1.160 11/06/2017 Platelets 197.000 K/ 01/24/2020  Creatinine, Serum 0.580 mg/ 01/24/2020 Potassium 3.500 mm 01/24/2020 ALT (SGPT) 15.000 IU/ 09/01/2019  TSH 0.870 10/13/2019  Hemoglobin 9.300 12/14/2018;  INR 1.160 11/06/2017 Platelets 188.000 12/14/2018  Creatinine, Serum 0.970  12/14/2018 Potassium 4.000 12/14/2018 Magnesium N/D ALT (SGPT) 15.000 12/14/2018  Labs 11/02/2018: BUN 26, creatinine 0.9, EGFR greater than 60 mL, Hb 10.6/HCT 32.2, platelets 244.   Medications and allergies   Allergies  Allergen Reactions  . Flecainide Other (See Comments)    Severe dizziness  . Avelox [Moxifloxacin Hcl In Nacl] Other (See Comments)    Unknown reaction  . Other Other (See Comments)    Other reaction(s): flu like symptoms  . Oxycodone Other (See Comments)    uphoria  . Alendronate Sodium Nausea Only    Current Outpatient Medications on File Prior to Visit  Medication Sig Dispense Refill  . acetaminophen (TYLENOL) 500 MG tablet Take 500 mg by mouth every 6 (six) hours as needed.    . Alpha-Lipoic Acid (LIPOIC ACID PO) Take 600 mg by mouth daily.    Marland Kitchen apixaban (ELIQUIS) 5 MG TABS tablet Take 1 tablet (5 mg total) by mouth 2 (two) times daily. With breakfast and dinner 60 tablet 5  . atorvastatin (LIPITOR) 10 MG tablet Take 1 tablet (10 mg total) by mouth daily. At night 30 tablet 11  . Calcium Carbonate-Vitamin D3 600-400 MG-UNIT TABS Take 1 tablet by mouth daily.    . cycloSPORINE (RESTASIS) 0.05 % ophthalmic emulsion Place 1 drop into both eyes 2 (two) times daily.     Marland Kitchen desmopressin (DDAVP) 0.2 MG tablet TAKE 1 TABLET BY MOUTH NIGHTLY. SLOWLY INCREASE AS NEEDED TO 3 TABLETS NIGHTLY    . diltiazem (CARDIZEM CD) 120 MG 24 hr capsule TAKE 1 CAPSULE BY MOUTH EVERY DAY 90 capsule 1  . DULoxetine (CYMBALTA) 30 MG capsule TAKE 1 CAPSULE BY MOUTH TWICE A DAY (Patient taking differently: Take 30 mg by mouth 2 (two) times daily.) 180 capsule 3  . fluticasone (FLONASE) 50 MCG/ACT nasal spray Place 1 spray into the nose daily as needed for allergies.     Marland Kitchen GLUCOSAMINE-CHONDROITIN PO Take 1 tablet by mouth daily at 12 noon.    . hydrochlorothiazide (HYDRODIURIL) 25 MG tablet Take 25 mg  by mouth in the morning.    Marland Kitchen losartan (COZAAR) 100 MG tablet Take 100 mg by mouth every evening.     . meclizine (ANTIVERT) 25 MG tablet Take 25 mg by mouth daily as needed for dizziness.    . Melatonin 5 MG CAPS Take 5 mg by mouth at bedtime.     . Multiple Vitamin (MULTIVITAMIN WITH MINERALS) TABS tablet Take 1 tablet by mouth daily.    . nitrofurantoin (MACRODANTIN) 100 MG capsule Take 100 mg by mouth daily.    . nitroGLYCERIN (NITROLINGUAL) 0.4 MG/SPRAY spray PLACE 1 SPRAY UNDER THE TONGUE EVERY 5 (FIVE) MINUTES FOR 3 DOSES AS NEEDED FOR CHEST PAIN (Patient taking differently: Place 1 spray under the tongue every 5 (five) minutes x 3 doses as needed for chest pain.) 12 g 1  . Omega-3 1000 MG CAPS Take 2,000 mg by mouth daily.     . pantoprazole (PROTONIX) 40 MG tablet Take 40 mg by mouth daily before breakfast.   1  . thyroid (ARMOUR) 60 MG tablet Take 60 mg by mouth daily before breakfast.    . tiZANidine (ZANAFLEX) 4 MG tablet Take 1 tablet (4 mg total) by mouth 3 (three) times daily as needed. (Patient taking differently: Take 4 mg by mouth 3 (three) times daily as needed for muscle spasms.) 90 tablet 5  . traMADol (ULTRAM) 50 MG tablet Take 1 tablet by mouth as needed.    . vitamin C (ASCORBIC ACID) 500 MG tablet Take 500 mg by mouth daily.      No current facility-administered medications on file prior to visit.    Radiology:   No results found.  Cardiac Studies:   Event Monitor for 30 days Start date 07/01/2018 for syncope : A. Fib one episode, no symptoms reported. No heart block or significant bradycardia  Carotid duplex 03/27/2018:  Right Carotid: Velocities in the right ICA are consistent with a 1-39% stenosis. Left Carotid: Velocities in the left ICA are consistent with a 1-39% stenosis. Vertebrals:  Bilateral vertebral arteries demonstrate antegrade flow. Subclavians: Normal flow hemodynamics were seen in bilateral subclavian arteries.   Event Monitor 30 days 11/11/2017: Predominant rhythm is normal sinus rhythm. Symptoms of fatigue reveals normal sinus rhythm.  Asymptomatic atrial fibrillation and probable atypical atrial flutter noted on 11/13/2017, 11/18/2017 and 12/09/2017 at 3:00 AM. Occasional PACs. Arrhythmia/PVC burden 6%.   Echo 11/09/2017: Left ventricle: The cavity size was normal. Systolic function was   normal. The estimated ejection fraction was in the range of 60% to 65%. Wall motion was normal; there were no regional wall   motion abnormalities. The study is not technically sufficient to   allow evaluation of LV diastolic function.Pulmonary arteries: PA peak pressure: 32 mm Hg (S).   Coronary angiogram 11/24/2016:  Normal coronaries; tortuous LAD   Loop Recorder   Loop recorder implantation 05/23/2020  Scheduled Remote loop recorder check 06/04/2020: Unremarkable transmission. Occasional PVC. No heart block, no atrial fibrillation. No symptoms reported  EKG:   EKG 05/17/2020: Sinus rhythm at a rate of 60 bpm, left atrial enlargement.  Left axis, left anterior fascicular block.  Nonspecific T wave abnormality.  Compared to EKG 04/19/2020, incomplete right bundle branch block and poor progression not present.  EKG 04/19/2020: Sinus rhythm at a rate of 79 bpm, left atrial enlargement.  Left axis, left anterior fascicular block.  Incomplete right bundle branch block.  Poor R wave progression, cannot exclude anteroseptal infarct old.  Nonspecific T wave abnormality.  Compared to  EKG 02/02/2020, no significant change.  EKG 02/02/2020: Sinus tachycardia at the rate of 103 bpm, left axis deviation, left anterior fascicular block.  Incomplete right bundle branch block.  Poor R wave progression, cannot exclude anteroseptal infarct old.  Nonspecific T abnormality.  No significant change from 06/12/2019.  EKG 06/20/2018: Atrial tachycardia at the rate of 133 bpm.  Left axis deviation, left anterior fascicular block.  Poor R-wave progression.  Incomplete right bundle branch block.  Assessment     ICD-10-CM   1. Essential hypertension, benign  I10   2.  Paroxysmal atrial fibrillation (HCC)  I48.0   3. Short-term memory loss  R41.3   4. Weakness of both lower extremities  R29.898     No orders of the defined types were placed in this encounter.   Medications Discontinued During This Encounter  Medication Reason  . acetaminophen-codeine (TYLENOL #3) 300-30 MG tablet Completed Course  . sucralfate (CARAFATE) 1 g tablet Completed Course  . spironolactone (ALDACTONE) 25 MG tablet Patient has not taken in last 30 days     This patients CHA2DS2-VASc Score 4 (HTN, F, Age)and yearly risk of stroke 4.8%.   Recommendations:   Cynthia [name]  is a 80 y.o. Caucasian female with hypertension, hyperlipidemia, frequent UTI,  paroxysmal atrial fibrillation, atrial tachycardia, obstructive sleep apnea on CPAP. Tolerating anticoagulation well. Flecainide discontinued due to dizziness. In the past she has had recurrent episodes of near syncope and severe orthostatic changes with recurrent UTI.  However patient with episodes of syncope and near syncope not associated with UTI.  Patient underwent surgery to her right foot 03/04/2020 and had an episode of syncope while at rehabilitation facility on 04/10/2020.   Patient presents for follow-up of hypertension. Blood pressure is now well controlled despite patient not starting spironolactone.  Will not make changes to her medications today.  From a cardiovascular standpoint patient is doing well overall, without recurrence of atrial fibrillation, and no recurrence of syncope or near syncope.  Advised patient to continue to drink Pedialyte daily as well as continue Eliquis, she is tolerating this without bleeding diathesis and has CHA2DS2-VASc score 4.  Patient will continue present medications, advised her not to start spironolactone at this time as her blood pressure is well controlled.   In regard to patient's concerns of physical deconditioning and imbalance, advised him to follow-up with Dr. Georgina Snell as per his last  note he had referred her to physical therapy, feel this would benefit patient greatly.  In regard to memory loss, patient has upcoming appointment in April with her neurologist, advised patient and her husband to discuss memory concerns with neurology.  From a cardiovascular standpoint patient is presently relatively stable.  Follow-up in 3 months, sooner if needed, for hypertension, paroxysmal atrial fibrillation, and hyperlipidemia.   Cynthia Berthold, PA-C 07/01/2020, 11:46 AM Office: [phone]

## 2020-07-01 ENCOUNTER — Ambulatory Visit: Payer: Medicare PPO | Admitting: Student

## 2020-07-01 ENCOUNTER — Other Ambulatory Visit: Payer: Self-pay

## 2020-07-01 ENCOUNTER — Encounter: Payer: Self-pay | Admitting: Student

## 2020-07-01 VITALS — BP 127/66 | HR 77 | Temp 98.0°F | Ht 65.0 in | Wt 141.0 lb

## 2020-07-01 DIAGNOSIS — R413 Other amnesia: Secondary | ICD-10-CM | POA: Diagnosis not present

## 2020-07-01 DIAGNOSIS — R29898 Other symptoms and signs involving the musculoskeletal system: Secondary | ICD-10-CM

## 2020-07-01 DIAGNOSIS — I480 Paroxysmal atrial fibrillation: Secondary | ICD-10-CM

## 2020-07-01 DIAGNOSIS — I10 Essential (primary) hypertension: Secondary | ICD-10-CM

## 2020-07-03 DIAGNOSIS — G44209 Tension-type headache, unspecified, not intractable: Secondary | ICD-10-CM | POA: Diagnosis not present

## 2020-07-03 DIAGNOSIS — M542 Cervicalgia: Secondary | ICD-10-CM | POA: Diagnosis not present

## 2020-07-04 DIAGNOSIS — G4761 Periodic limb movement disorder: Secondary | ICD-10-CM | POA: Diagnosis not present

## 2020-07-04 DIAGNOSIS — I10 Essential (primary) hypertension: Secondary | ICD-10-CM | POA: Diagnosis not present

## 2020-07-04 DIAGNOSIS — F0280 Dementia in other diseases classified elsewhere without behavioral disturbance: Secondary | ICD-10-CM | POA: Diagnosis not present

## 2020-07-04 DIAGNOSIS — G319 Degenerative disease of nervous system, unspecified: Secondary | ICD-10-CM | POA: Diagnosis not present

## 2020-07-04 DIAGNOSIS — G4489 Other headache syndrome: Secondary | ICD-10-CM | POA: Diagnosis not present

## 2020-07-04 DIAGNOSIS — G9341 Metabolic encephalopathy: Secondary | ICD-10-CM | POA: Diagnosis not present

## 2020-07-04 DIAGNOSIS — Z4789 Encounter for other orthopedic aftercare: Secondary | ICD-10-CM | POA: Diagnosis not present

## 2020-07-04 DIAGNOSIS — I700 Atherosclerosis of aorta: Secondary | ICD-10-CM | POA: Diagnosis not present

## 2020-07-04 DIAGNOSIS — I480 Paroxysmal atrial fibrillation: Secondary | ICD-10-CM | POA: Diagnosis not present

## 2020-07-10 ENCOUNTER — Ambulatory Visit: Payer: Medicare PPO | Attending: Family Medicine | Admitting: Physical Therapy

## 2020-07-11 ENCOUNTER — Telehealth: Payer: Self-pay | Admitting: Cardiology

## 2020-07-11 NOTE — Telephone Encounter (Signed)
Spoke with patient. She did not know it had not transmitted. I have contacted Ruby Cola with [facility], and she will contact her to help troubleshoot.

## 2020-07-12 ENCOUNTER — Other Ambulatory Visit: Payer: Self-pay | Admitting: Neurology

## 2020-07-13 ENCOUNTER — Encounter: Payer: Self-pay | Admitting: Student

## 2020-07-14 ENCOUNTER — Telehealth: Payer: Self-pay | Admitting: Student

## 2020-07-14 DIAGNOSIS — I951 Orthostatic hypotension: Secondary | ICD-10-CM | POA: Diagnosis not present

## 2020-07-14 DIAGNOSIS — R42 Dizziness and giddiness: Secondary | ICD-10-CM | POA: Diagnosis not present

## 2020-07-14 NOTE — Telephone Encounter (Signed)
ON-CALL CARDIOLOGY 07/12/2020 9:40am   Patient's name: Cynthia [name].   MRN: [id number].    DOB: 1940/08/24 Primary care provider: Deland Pretty, MD. Primary cardiologist: Dr. Einar Gip, Lawerance Cruel, PA-C  Interaction regarding this patient's care today: Patient called today with complaints of dizziness and fatigue. I advised her to check her blood pressure and it was 88/68 mmHg per home monitor. She denies chest pain, dyspnea, syncope, near-syncope. She reports symptoms were worse earlier in the morning, but have improved following eating toast and drinking Pedilyte. Notably patient has had similar symptoms in the past with UTI.    Impression:   ICD-10-CM   1. Dizziness  R42   2. Orthostatic hypotension  I95.1     No orders of the defined types were placed in this encounter.   No orders of the defined types were placed in this encounter.   Recommendations: Advised patient to discoutinue hydrochlorothiazide and to hold diltiazem, and losartan today. Then to monitor her blood pressure closely tomorrow. If blood pressure >096 mmHg systolic tomorrow she may resume taking losartan and diltiazem. Counseled patient regarding signs and symptoms that would warrant emergent evaluation, she and her husband bother verbalized understanding.   Also discussed with patient that her symptoms are also similar to previous UTI symptoms and if symptoms persist despite holding antihypertensive medications would recommend she follow up with her PCP.   Telephone encounter total time: 17 minutes    Alethia Berthold, PA-C 07/14/2020, 10:02 PM Office: [phone]

## 2020-07-15 DIAGNOSIS — N390 Urinary tract infection, site not specified: Secondary | ICD-10-CM | POA: Diagnosis not present

## 2020-07-17 ENCOUNTER — Telehealth: Payer: Self-pay | Admitting: Neurology

## 2020-07-17 DIAGNOSIS — R413 Other amnesia: Secondary | ICD-10-CM

## 2020-07-17 DIAGNOSIS — I480 Paroxysmal atrial fibrillation: Secondary | ICD-10-CM

## 2020-07-17 DIAGNOSIS — G4733 Obstructive sleep apnea (adult) (pediatric): Secondary | ICD-10-CM

## 2020-07-17 DIAGNOSIS — R269 Unspecified abnormalities of gait and mobility: Secondary | ICD-10-CM

## 2020-07-17 DIAGNOSIS — G629 Polyneuropathy, unspecified: Secondary | ICD-10-CM

## 2020-07-17 DIAGNOSIS — R42 Dizziness and giddiness: Secondary | ICD-10-CM

## 2020-07-17 DIAGNOSIS — G4489 Other headache syndrome: Secondary | ICD-10-CM

## 2020-07-17 DIAGNOSIS — G912 (Idiopathic) normal pressure hydrocephalus: Secondary | ICD-10-CM

## 2020-07-17 DIAGNOSIS — M542 Cervicalgia: Secondary | ICD-10-CM

## 2020-07-17 DIAGNOSIS — G9389 Other specified disorders of brain: Secondary | ICD-10-CM

## 2020-07-17 NOTE — Telephone Encounter (Signed)
Dr. Felecia Shelling agreed to place referral. I placed referral as requested.

## 2020-07-17 NOTE — Telephone Encounter (Signed)
Pt's husband, Cinzia Devos (on [place]) called, requesting a referral for second opinion for [facility] neurological department.  [place] contact info: [phone]

## 2020-07-22 NOTE — Telephone Encounter (Signed)
[facility] is reviewing referral. Patient's husband will  be contacted .

## 2020-07-29 DIAGNOSIS — J309 Allergic rhinitis, unspecified: Secondary | ICD-10-CM | POA: Diagnosis not present

## 2020-07-29 DIAGNOSIS — K5900 Constipation, unspecified: Secondary | ICD-10-CM | POA: Diagnosis not present

## 2020-07-31 ENCOUNTER — Ambulatory Visit: Payer: Medicare PPO | Admitting: Neurology

## 2020-08-02 ENCOUNTER — Ambulatory Visit: Payer: Medicare PPO | Admitting: Cardiology

## 2020-08-09 DIAGNOSIS — R55 Syncope and collapse: Secondary | ICD-10-CM | POA: Diagnosis not present

## 2020-08-09 DIAGNOSIS — Z4509 Encounter for adjustment and management of other cardiac device: Secondary | ICD-10-CM | POA: Diagnosis not present

## 2020-08-09 DIAGNOSIS — Z95818 Presence of other cardiac implants and grafts: Secondary | ICD-10-CM | POA: Diagnosis not present

## 2020-08-19 ENCOUNTER — Encounter: Payer: Self-pay | Admitting: Neurology

## 2020-08-19 ENCOUNTER — Ambulatory Visit (INDEPENDENT_AMBULATORY_CARE_PROVIDER_SITE_OTHER): Payer: Medicare PPO | Admitting: Neurology

## 2020-08-19 ENCOUNTER — Other Ambulatory Visit: Payer: Self-pay

## 2020-08-19 VITALS — BP 132/71 | HR 67 | Ht 65.0 in | Wt 149.5 lb

## 2020-08-19 DIAGNOSIS — G4489 Other headache syndrome: Secondary | ICD-10-CM | POA: Diagnosis not present

## 2020-08-19 DIAGNOSIS — G912 (Idiopathic) normal pressure hydrocephalus: Secondary | ICD-10-CM

## 2020-08-19 DIAGNOSIS — G629 Polyneuropathy, unspecified: Secondary | ICD-10-CM

## 2020-08-19 DIAGNOSIS — R3915 Urgency of urination: Secondary | ICD-10-CM | POA: Diagnosis not present

## 2020-08-19 DIAGNOSIS — R269 Unspecified abnormalities of gait and mobility: Secondary | ICD-10-CM

## 2020-08-19 NOTE — Progress Notes (Signed)
[facility]  PATIENT: Cynthia [name] DOB: Aug 17, 1940 _________________________________   HISTORICAL  CHIEF COMPLAINT:  Chief Complaint  Patient presents with  . Follow-up    RM 12 w/ husband. Last seen 01/29/2020. Ambulates w/ rolling walker.    HISTORY OF PRESENT ILLNESS:  [name] is a 80 y.o. woman with polyneuropathy, sleep apnea and memory loss.   She had a cardiac arrest 11/06/2017  Update 08/19/2020: She is having more pain in the right hip.  Pain started 6 months ago and has worsened.   She has had steroid shots in the past.   She is seeing rheumatology later this week.     She is seeing [facility] (sees Mable Paris, DPT)  She denies headaches recently.  Neuropathy pain in the feet has improved with alpha lipoic acid OTC.  She had a high volume LP 03/04/2020 (35 cc removed).   Her husband felt her gait was noticeably better x 2 days and a little better for a few weeks.   She saw Neurosurgery (Dr. Zada Finders).   Around that time, she had been placed on oxycodone for foot surgery and was having hallucinations.    He had been concerned about cognition and to reconsider after she get back to baseline.    She has urinary frequency and feels it is better now than last year.  Of note, she takes desmopressin at night.     She is on Eliquis for AFib.  Her daughter has Ehlers-Danlos  I reviewed the 03/20/2020 CT scan.  It was unchanged compared to 01/24/2020 and shows ventriculomegaly and microvascular ischemic changes.   REVIEW OF SYSTEMS: Constitutional: No fevers, chills, sweats, or change in appetite Eyes: No visual changes, double vision, eye pain Ear, nose and throat: Chronic sinusutis.  Bilat hearing loss, worse on left  Cardiovascular: No chest pain, palpitations Respiratory: No shortness of breath at rest or with exertion.   No wheezes.  Has OSA GastrointestinaI: No nausea, vomiting, diarrhea, abdominal pain, fecal incontinence.   Occ  dysphagia Genitourinary: Frequent UTIs.  SOme frequency. Musculoskeletal: as above Integumentary: No rash, pruritus, skin lesions Neurological: as above Psychiatric: No depression at this time.  No anxiety Endocrine: No palpitations, diaphoresis, change in appetite.  Increased thirst Hematologic/Lymphatic: No anemia, purpura, petechiae. Allergic/Immunologic: No itchy/runny eyes, recent allergic reactions, rashes  ALLERGIES: Allergies  Allergen Reactions  . Flecainide Other (See Comments)    Severe dizziness  . Avelox [Moxifloxacin Hcl In Nacl] Other (See Comments)    Unknown reaction  . Other Other (See Comments)    Other reaction(s): flu like symptoms  . Oxycodone Other (See Comments)    uphoria  . Alendronate Sodium Nausea Only    HOME MEDICATIONS:  Current Outpatient Medications:  .  acetaminophen (TYLENOL) 500 MG tablet, Take 500 mg by mouth every 6 (six) hours as needed., Disp: , Rfl:  .  Alpha-Lipoic Acid (LIPOIC ACID PO), Take 600 mg by mouth daily., Disp: , Rfl:  .  apixaban (ELIQUIS) 5 MG TABS tablet, Take 1 tablet (5 mg total) by mouth 2 (two) times daily. With breakfast and dinner, Disp: 60 tablet, Rfl: 5 .  atorvastatin (LIPITOR) 10 MG tablet, Take 1 tablet (10 mg total) by mouth daily. At night, Disp: 30 tablet, Rfl: 11 .  Calcium Carbonate-Vitamin D3 600-400 MG-UNIT TABS, Take 1 tablet by mouth daily., Disp: , Rfl:  .  cycloSPORINE (RESTASIS) 0.05 % ophthalmic emulsion, Place 1 drop into both eyes 2 (two) times daily. ,  Disp: , Rfl:  .  desmopressin (DDAVP) 0.2 MG tablet, TAKE 1 TABLET BY MOUTH NIGHTLY. SLOWLY INCREASE AS NEEDED TO 3 TABLETS NIGHTLY, Disp: , Rfl:  .  diltiazem (CARDIZEM CD) 120 MG 24 hr capsule, TAKE 1 CAPSULE BY MOUTH EVERY DAY, Disp: 90 capsule, Rfl: 1 .  DULoxetine (CYMBALTA) 30 MG capsule, TAKE 1 CAPSULE BY MOUTH TWICE A DAY (Patient taking differently: Take 30 mg by mouth 2 (two) times daily.), Disp: 180 capsule, Rfl: 3 .  fluticasone  (FLONASE) 50 MCG/ACT nasal spray, Place 1 spray into the nose daily as needed for allergies. , Disp: , Rfl:  .  GLUCOSAMINE-CHONDROITIN PO, Take 1 tablet by mouth daily at 12 noon., Disp: , Rfl:  .  hydrochlorothiazide (HYDRODIURIL) 25 MG tablet, Take 25 mg by mouth in the morning., Disp: , Rfl:  .  losartan (COZAAR) 100 MG tablet, Take 100 mg by mouth every evening., Disp: , Rfl:  .  meclizine (ANTIVERT) 25 MG tablet, Take 25 mg by mouth daily as needed for dizziness., Disp: , Rfl:  .  Melatonin 5 MG CAPS, Take 5 mg by mouth at bedtime. , Disp: , Rfl:  .  Multiple Vitamin (MULTIVITAMIN WITH MINERALS) TABS tablet, Take 1 tablet by mouth daily., Disp: , Rfl:  .  nitrofurantoin (MACRODANTIN) 100 MG capsule, Take 100 mg by mouth daily., Disp: , Rfl:  .  nitroGLYCERIN (NITROLINGUAL) 0.4 MG/SPRAY spray, PLACE 1 SPRAY UNDER THE TONGUE EVERY 5 (FIVE) MINUTES FOR 3 DOSES AS NEEDED FOR CHEST PAIN (Patient taking differently: Place 1 spray under the tongue every 5 (five) minutes x 3 doses as needed for chest pain.), Disp: 12 g, Rfl: 1 .  Omega-3 1000 MG CAPS, Take 2,000 mg by mouth daily. , Disp: , Rfl:  .  pantoprazole (PROTONIX) 40 MG tablet, Take 40 mg by mouth daily before breakfast. , Disp: , Rfl: 1 .  thyroid (ARMOUR) 60 MG tablet, Take 60 mg by mouth daily before breakfast., Disp: , Rfl:  .  tiZANidine (ZANAFLEX) 4 MG tablet, Take 1 tablet (4 mg total) by mouth 3 (three) times daily as needed. (Patient taking differently: Take 4 mg by mouth 3 (three) times daily as needed for muscle spasms.), Disp: 90 tablet, Rfl: 5 .  traMADol (ULTRAM) 50 MG tablet, Take 1 tablet by mouth as needed., Disp: , Rfl:  .  vitamin C (ASCORBIC ACID) 500 MG tablet, Take 500 mg by mouth daily. , Disp: , Rfl:   PAST MEDICAL HISTORY: Past Medical History:  Diagnosis Date  . Acute blood loss anemia 10/27/2017  . Acute GI bleeding 10/27/2017  . Anemia   . Arthritis    "joints" (11/10/2017)  . Dizziness   . Encounter for loop  recorder check 06/09/2020  . GERD (gastroesophageal reflux disease)   . GI bleed 11/06/2017  . Hearing loss   . History of blood transfusion 10/2017  . Hyperlipidemia   . Hyperthyroidism   . Loop recorder Biotronik Loop Recorder 05/23/2020 05/23/2020  . Melanoma ([place])    "cut off my back"  . Memory loss   . Sinus headache   . Sleep apnea   . Syncope and collapse     PAST SURGICAL HISTORY: Past Surgical History:  Procedure Laterality Date  . ABDOMINAL HYSTERECTOMY     "partial"  . APPENDECTOMY    . BIOPSY  11/17/2017   Procedure: BIOPSY;  Surgeon: Clarene Essex, MD;  Location: [facility];  Service: Endoscopy;;  . COLONOSCOPY WITH PROPOFOL N/A  11/17/2017   Procedure: COLONOSCOPY WITH PROPOFOL;  Surgeon: Clarene Essex, MD;  Location: [facility];  Service: Endoscopy;  Laterality: N/A;  . CRANIECTOMY FOR EXCISION OF ACOUSTIC NEUROMA    . ESOPHAGOGASTRODUODENOSCOPY (EGD) WITH PROPOFOL N/A 10/28/2017   Procedure: ESOPHAGOGASTRODUODENOSCOPY (EGD) WITH PROPOFOL;  Surgeon: Clarene Essex, MD;  Location: [facility];  Service: Endoscopy;  Laterality: N/A;  . FOOT SURGERY Bilateral 04/18/2020  . KNEE ARTHROSCOPY Left   . LEFT HEART CATH AND CORONARY ANGIOGRAPHY N/A 11/24/2016   Procedure: LEFT HEART CATH AND CORONARY ANGIOGRAPHY;  Surgeon: Jettie Booze, MD;  Location: [facility];  Service: Cardiovascular;  Laterality: N/A;  . MELANOMA EXCISION     "off my back"  . NASAL SINUS SURGERY    . TONSILLECTOMY      FAMILY HISTORY: Family History  Problem Relation Age of Onset  . Congestive Heart Failure Mother   . Heart disease Father        History of heart attacks at a later age.    Marland Kitchen Heart disease Brother   . Hearing loss Brother     SOCIAL HISTORY:  Social History   Socioeconomic History  . Marital status: Married    Spouse name: Not on file  . Number of children: 2  . Years of education: Not on file  . Highest education level: Not on file  Occupational History  . Not  on file  Tobacco Use  . Smoking status: Former Smoker    Years: 2.00    Types: Cigarettes    Quit date: 1980    Years since quitting: 42.3  . Smokeless tobacco: Never Used  . Tobacco comment: 11/10/2017 "only smoked when I was on my period"  Vaping Use  . Vaping Use: Never used  Substance and Sexual Activity  . Alcohol use: Yes    Comment: 11/10/2017 "nothing lately; did have 1 glass of wine q night"  . Drug use: Never  . Sexual activity: Not Currently  Other Topics Concern  . Not on file  Social History Narrative   Right handed    Social Determinants of Health   Financial Resource Strain: Not on file  Food Insecurity: Not on file  Transportation Needs: Not on file  Physical Activity: Not on file  Stress: Not on file  Social Connections: Not on file  Intimate Partner Violence: Not on file     PHYSICAL EXAM  Vitals:   08/19/20 1114  BP: 132/71  Pulse: 67  Weight: 149 lb 8 oz (67.8 kg)  Height: 5\' 5"  (1.651 m)    Body mass index is 24.88 kg/m.   General: The patient is well-developed and well-nourished and in no acute distress  Neck:   She has only mild occipital tenderness at this time with a mildly reduced range of motion of the neck.  Skin: Extremities are without significant edema, rash.     Neurologic Exam  Mental status: The patient is alert and oriented x 2 1/2 (wrong date) at the time of the examination.  See MMSE/MoCA scores above for details.Marland Kitchen   Speech is normal.  Cranial nerves: Extraocular movements are full.   Facial strength and sensation is normal.  Trapezius strength is normal.   Reduced hearing on the left.   Motor:  Muscle bulk is normal.   Tone is normal. Strength is  5 / 5 in all 4 extremities.   Sensory: Sensory testing is intact to soft touch in the arms but reduced sensation to vibration  in the toes.  Fairly normal sensation at the ankles.  Coordination: Cerebellar testing shows good finger-nose-finger and heel-to-shin   Gait and  station: Station is normal.   Her gait is slightly wide.  Tandem gait is wide..  Reflexes: Symmetric in arms and legs.    ASSESSMENT AND PLAN  Gait disturbance  NPH (normal pressure hydrocephalus) (HCC)  Urinary urgency  Other headache syndrome   1.   Ventricles are enlarged out of proportion to the mild atrophy and gait improved for a couple days after a high-volume lumbar puncture.  Therefore, she most likely has normal pressure hydrocephalus.  Around the time she saw neurosurgery, she had had an emergency room visit visit for hallucinations.  These were occurring when she was on oxycodone after foot surgery and she is not experiencing any more mental status changes (at baseline).  We discussed consideration of a shunt for NPH and she will consider making another appointment to see Dr. Zada Finders. 2.   Continue APAP nightly.   She has some sleepiness.    If this worsens consider a low-dose of stimulant or if she also has sleepiness of wake promoting agent such as Nuvigil.     3  continue alpha lipoic acid 600-800 mg daily for neuropathy 4.   rtc 6 months for scheduled visit if stable.    Tanyah Debruyne A. Felecia Shelling, MD, PhD 11/11/8561, 1:49 PM Certified in Neurology, Clinical Neurophysiology, Sleep Medicine, Pain Medicine and Neuroimaging  [facility] [address], [place], [place] [address] [phone]

## 2020-08-21 DIAGNOSIS — M19012 Primary osteoarthritis, left shoulder: Secondary | ICD-10-CM | POA: Diagnosis not present

## 2020-08-21 DIAGNOSIS — M549 Dorsalgia, unspecified: Secondary | ICD-10-CM | POA: Diagnosis not present

## 2020-08-21 DIAGNOSIS — M1990 Unspecified osteoarthritis, unspecified site: Secondary | ICD-10-CM | POA: Diagnosis not present

## 2020-08-21 DIAGNOSIS — M7582 Other shoulder lesions, left shoulder: Secondary | ICD-10-CM | POA: Diagnosis not present

## 2020-08-21 DIAGNOSIS — M25512 Pain in left shoulder: Secondary | ICD-10-CM | POA: Diagnosis not present

## 2020-08-21 DIAGNOSIS — M25561 Pain in right knee: Secondary | ICD-10-CM | POA: Diagnosis not present

## 2020-08-21 DIAGNOSIS — I700 Atherosclerosis of aorta: Secondary | ICD-10-CM | POA: Diagnosis not present

## 2020-08-22 DIAGNOSIS — N390 Urinary tract infection, site not specified: Secondary | ICD-10-CM | POA: Diagnosis not present

## 2020-08-26 ENCOUNTER — Telehealth: Payer: Self-pay

## 2020-08-26 ENCOUNTER — Encounter: Payer: Self-pay | Admitting: Student

## 2020-08-26 ENCOUNTER — Other Ambulatory Visit: Payer: Self-pay

## 2020-08-26 ENCOUNTER — Ambulatory Visit: Payer: Medicare PPO | Admitting: Student

## 2020-08-26 VITALS — BP 163/89 | HR 84 | Temp 98.2°F | Ht 65.0 in | Wt 148.0 lb

## 2020-08-26 DIAGNOSIS — I480 Paroxysmal atrial fibrillation: Secondary | ICD-10-CM | POA: Diagnosis not present

## 2020-08-26 DIAGNOSIS — R072 Precordial pain: Secondary | ICD-10-CM

## 2020-08-26 DIAGNOSIS — I10 Essential (primary) hypertension: Secondary | ICD-10-CM

## 2020-08-26 MED ORDER — METOPROLOL SUCCINATE ER 25 MG PO TB24: 25.0000 mg | ORAL_TABLET | Freq: Every day | ORAL | 3 refills | Status: DC

## 2020-08-26 NOTE — Progress Notes (Signed)
Primary Physician/Referring:  Deland Pretty, MD  [name], female    DOB: 01-29-1941, 80 y.o.   MRN: [id number]  Chief Complaint  Patient presents with  . Chest Pain  . Follow-up   HPI:    Cynthia [name]  is a 80 y.o. Caucasian female with hypertension, hyperlipidemia, frequent UTI,  paroxysmal atrial fibrillation, atrial tachycardia, obstructive sleep apnea on CPAP. Tolerating anticoagulation well. Flecainide discontinued due to dizziness. In the past she has had recurrent episodes of near syncope and severe orthostatic changes with recurrent UTI.  However patient with episodes of syncope and near syncope not associated with UTI.  Patient underwent surgery to her right foot 03/04/2020 and had an episode of syncope while at rehabilitation facility on 04/10/2020.   Patient was last seen in our office 07/01/2020 at which time she was doing well and recommended for 26-monthfollow-up.  She then called the office 07/14/2020 with concerns of lightheadedness and dizziness, at that time patient was advised to discontinue hydrochlorothiazide.  Patient now presents for urgent visit with complaints of chest pain.   Patient reports she has continued to have episodes of dizziness, headaches, and gait imbalance. She was recently seen by neurology who suspects symptoms may be related to underlying normal pressure hydrocephalus and recommends evaluation for shunt placement by neurosurgery. Patient was also recently diagnosed by PCP with UTI which may be contributing to symptoms. She is planning to pick up an antibiotic to start today for treatment of UTI.   She presents to our office today concerned as she had an episode of chest pain last night while in bed lasting approximately 15 minutes. Patient did take nitroglycerin and Tums which she reports improved the pain. Denies associated symptoms. Denies dyspnea, palpitations, syncope, near-syncope.   Patient has had no recurrence of syncope or near  syncope.  She is tolerating anticoagulation without bleeding diathesis. Loop recorder transmission did reveal atrial fibrillation with RVR.   Past Medical History:  Diagnosis Date  . Acute blood loss anemia 10/27/2017  . Acute GI bleeding 10/27/2017  . Anemia   . Arthritis    "joints" (11/10/2017)  . Dizziness   . Encounter for loop recorder check 06/09/2020  . GERD (gastroesophageal reflux disease)   . GI bleed 11/06/2017  . Hearing loss   . History of blood transfusion 10/2017  . Hyperlipidemia   . Hyperthyroidism   . Loop recorder Biotronik Loop Recorder 05/23/2020 05/23/2020  . [place]    [place]"  . Memory loss   . Sinus headache   . Sleep apnea   . Syncope and collapse    Past Surgical History:  Procedure Laterality Date  . ABDOMINAL HYSTERECTOMY     "partial"  . APPENDECTOMY    . BIOPSY  11/17/2017   Procedure: BIOPSY;  Surgeon: MClarene Essex MD;  Location: [facility];  Service: Endoscopy;;  . COLONOSCOPY WITH PROPOFOL N/A 11/17/2017   Procedure: COLONOSCOPY WITH PROPOFOL;  Surgeon: MClarene Essex MD;  Location: [facility];  Service: Endoscopy;  Laterality: N/A;  . CRANIECTOMY FOR EXCISION OF ACOUSTIC NEUROMA    . ESOPHAGOGASTRODUODENOSCOPY (EGD) WITH PROPOFOL N/A 10/28/2017   Procedure: ESOPHAGOGASTRODUODENOSCOPY (EGD) WITH PROPOFOL;  Surgeon: MClarene Essex MD;  Location: [facility];  Service: Endoscopy;  Laterality: N/A;  . FOOT SURGERY Bilateral 04/18/2020  . KNEE ARTHROSCOPY Left   . LEFT HEART CATH AND CORONARY ANGIOGRAPHY N/A 11/24/2016   Procedure: LEFT HEART CATH AND CORONARY ANGIOGRAPHY;  Surgeon: VLarae Grooms  S, MD;  Location: [facility];  Service: Cardiovascular;  Laterality: N/A;  . MELANOMA EXCISION     "off my back"  . NASAL SINUS SURGERY    . TONSILLECTOMY     Family History  Problem Relation Age of Onset  . Congestive Heart Failure Mother   . Heart disease Father        History of heart attacks at a later age.    Marland Kitchen Heart  disease Brother   . Hearing loss Brother     Social History   Tobacco Use  . Smoking status: Former Smoker    Years: 2.00    Types: Cigarettes    Quit date: 1980    Years since quitting: 42.4  . Smokeless tobacco: Never Used  . Tobacco comment: 11/10/2017 "only smoked when I was on my period"  Substance Use Topics  . Alcohol use: Yes    Comment: 11/10/2017 "nothing lately; did have 1 glass of wine q night"  Marital Status: Married   ROS  Review of Systems  Constitutional: Negative for malaise/fatigue and weight gain.  Cardiovascular: Positive for chest pain (15 min episode last night). Negative for claudication, dyspnea on exertion, leg swelling, near-syncope, orthopnea, palpitations, paroxysmal nocturnal dyspnea and syncope.  Respiratory: Negative for shortness of breath.   Hematologic/Lymphatic: Does not bruise/bleed easily.  Gastrointestinal: Negative for melena.  Neurological: Positive for dizziness and light-headedness. Negative for weakness.   Objective  Blood pressure (!) 163/89, pulse 84, temperature 98.2 F (36.8 C), height '5\' 5"'  (1.651 m), weight 148 lb (67.1 kg), SpO2 96 %.  Vitals with BMI 08/26/2020 08/19/2020 07/01/2020  Height '5\' 5"'  '5\' 5"'  '5\' 5"'   Weight 148 lbs 149 lbs 8 oz 141 lbs  BMI 24.63 96.04 54.09  Systolic 811 914 782  Diastolic 89 71 66  Pulse 84 67 77    Physical Exam Vitals reviewed.  Constitutional:      General: She is not in acute distress. Cardiovascular:     Rate and Rhythm: Normal rate and regular rhythm.     Pulses: Intact distal pulses.     Heart sounds: Normal heart sounds, S1 normal and S2 normal. No murmur heard. No gallop.      Comments: No leg edema, no JVD. Pulmonary:     Effort: Pulmonary effort is normal. No respiratory distress.     Breath sounds: Normal breath sounds. No wheezing, rhonchi or rales.  Musculoskeletal:     Right lower leg: No edema.     Left lower leg: No edema.  Skin:    General: Skin is warm and dry.   Neurological:     Mental Status: She is alert.    Laboratory examination:   Recent Labs    03/25/20 1308 03/26/20 0659 04/10/20 1034  NA 129* 132* 144  K 3.3* 3.4* 3.2*  CL 93* 97* 107  CO2 '24 22 27  ' GLUCOSE 128* 114* 105*  BUN '16 11 19  ' CREATININE 0.68 0.57 0.66  CALCIUM 9.1 9.9 9.3  GFRNONAA >60 >60 >60   CrCl cannot be calculated (Patient's most recent lab result is older than the maximum 21 days allowed.).  CMP Latest Ref Rng & Units 04/10/2020 03/26/2020 03/25/2020  Glucose 70 - 99 mg/dL 105(H) 114(H) 128(H)  BUN 8 - 23 mg/dL '19 11 16  ' Creatinine 0.44 - 1.00 mg/dL 0.66 0.57 0.68  Sodium 135 - 145 mmol/L 144 132(L) 129(L)  Potassium 3.5 - 5.1 mmol/L 3.2(L) 3.4(L) 3.3(L)  Chloride 98 -  111 mmol/L 107 97(L) 93(L)  CO2 22 - 32 mmol/L '27 22 24  ' Calcium 8.9 - 10.3 mg/dL 9.3 9.9 9.1  Total Protein 6.5 - 8.1 g/dL 6.1(L) - -  Total Bilirubin 0.3 - 1.2 mg/dL 0.8 - -  Alkaline Phos 38 - 126 U/L 47 - -  AST 15 - 41 U/L 20 - -  ALT 0 - 44 U/L 19 - -   CBC Latest Ref Rng & Units 04/10/2020 03/25/2020 03/24/2020  WBC 4.0 - 10.5 K/uL 6.2 8.2 8.8  Hemoglobin 12.0 - 15.0 g/dL 11.8(L) 12.6 12.4  Hematocrit 36.0 - 46.0 % 35.7(L) 34.8(L) 35.8(L)  Platelets 150 - 400 K/uL 280 245 278   Lipid Panel     Component Value Date/Time   CHOL 165 11/24/2016 0040   TRIG 88 11/24/2016 0040   HDL 61 11/24/2016 0040   CHOLHDL 2.7 11/24/2016 0040   VLDL 18 11/24/2016 0040   LDLCALC 86 11/24/2016 0040   HEMOGLOBIN A1C Lab Results  Component Value Date   HGBA1C 5.6 11/24/2016   MPG 114 11/24/2016   TSH Recent Labs    03/21/20 1219  TSH 0.974    External labs:   Cholesterol, total 145.000 m 09/01/2019 HDL 67.000 mg 10/13/2019 LDL-C 94.000 cal 10/13/2019 Triglycerides 80.000 mg 10/13/2019  Hemoglobin 10.900 g/d 01/24/2020 INR 1.160 11/06/2017 Platelets 197.000 K/ 01/24/2020  Creatinine, Serum 0.580 mg/ 01/24/2020 Potassium 3.500 mm 01/24/2020 ALT (SGPT) 15.000 IU/  09/01/2019  TSH 0.870 10/13/2019  Hemoglobin 9.300 12/14/2018;  INR 1.160 11/06/2017 Platelets 188.000 12/14/2018  Creatinine, Serum 0.970 12/14/2018 Potassium 4.000 12/14/2018 Magnesium N/D ALT (SGPT) 15.000 12/14/2018  Labs 11/02/2018: BUN 26, creatinine 0.9, EGFR greater than 60 mL, Hb 10.6/HCT 32.2, platelets 244.   Medications and allergies   Allergies  Allergen Reactions  . Flecainide Other (See Comments)    Severe dizziness  . Avelox [Moxifloxacin Hcl In Nacl] Other (See Comments)    Unknown reaction  . Other Other (See Comments)    Other reaction(s): flu like symptoms  . Oxycodone Other (See Comments)    uphoria  . Alendronate Sodium Nausea Only    Current Outpatient Medications on File Prior to Visit  Medication Sig Dispense Refill  . acetaminophen (TYLENOL) 500 MG tablet Take 500 mg by mouth every 6 (six) hours as needed.    . Alpha-Lipoic Acid (LIPOIC ACID PO) Take 600 mg by mouth daily.    Marland Kitchen apixaban (ELIQUIS) 5 MG TABS tablet Take 1 tablet (5 mg total) by mouth 2 (two) times daily. With breakfast and dinner 60 tablet 5  . atorvastatin (LIPITOR) 10 MG tablet Take 1 tablet (10 mg total) by mouth daily. At night 30 tablet 11  . Calcium Carbonate-Vitamin D3 600-400 MG-UNIT TABS Take 1 tablet by mouth daily.    Marland Kitchen desmopressin (DDAVP) 0.2 MG tablet TAKE 1 TABLET BY MOUTH NIGHTLY. SLOWLY INCREASE AS NEEDED TO 3 TABLETS NIGHTLY    . diltiazem (CARDIZEM CD) 120 MG 24 hr capsule TAKE 1 CAPSULE BY MOUTH EVERY DAY 90 capsule 1  . DULoxetine (CYMBALTA) 30 MG capsule TAKE 1 CAPSULE BY MOUTH TWICE A DAY (Patient taking differently: Take 30 mg by mouth 2 (two) times daily.) 180 capsule 3  . fluticasone (FLONASE) 50 MCG/ACT nasal spray Place 1 spray into the nose daily as needed for allergies.     Marland Kitchen GLUCOSAMINE-CHONDROITIN PO Take 1 tablet by mouth daily at 12 noon.    . hydrochlorothiazide (HYDRODIURIL) 25 MG tablet Take 25 mg by mouth  in the morning.    Marland Kitchen losartan (COZAAR) 100 MG tablet Take  100 mg by mouth every evening.    . meclizine (ANTIVERT) 25 MG tablet Take 25 mg by mouth daily as needed for dizziness.    . Melatonin 5 MG CAPS Take 5 mg by mouth at bedtime.     . Multiple Vitamin (MULTIVITAMIN WITH MINERALS) TABS tablet Take 1 tablet by mouth daily.    . nitrofurantoin (MACRODANTIN) 100 MG capsule Take 100 mg by mouth daily.    . nitroGLYCERIN (NITROLINGUAL) 0.4 MG/SPRAY spray PLACE 1 SPRAY UNDER THE TONGUE EVERY 5 (FIVE) MINUTES FOR 3 DOSES AS NEEDED FOR CHEST PAIN (Patient taking differently: Place 1 spray under the tongue every 5 (five) minutes x 3 doses as needed for chest pain.) 12 g 1  . Omega-3 1000 MG CAPS Take 2,000 mg by mouth daily.     . pantoprazole (PROTONIX) 40 MG tablet Take 40 mg by mouth daily before breakfast.   1  . thyroid (ARMOUR) 60 MG tablet Take 60 mg by mouth daily before breakfast.    . tiZANidine (ZANAFLEX) 4 MG tablet Take 1 tablet (4 mg total) by mouth 3 (three) times daily as needed. (Patient taking differently: Take 4 mg by mouth 3 (three) times daily as needed for muscle spasms.) 90 tablet 5  . traMADol (ULTRAM) 50 MG tablet Take 1 tablet by mouth as needed.    . vitamin C (ASCORBIC ACID) 500 MG tablet Take 500 mg by mouth daily.     . cycloSPORINE (RESTASIS) 0.05 % ophthalmic emulsion Place 1 drop into both eyes 2 (two) times daily.      No current facility-administered medications on file prior to visit.    Radiology:   No results found.  Cardiac Studies:   Event Monitor for 30 days Start date 07/01/2018 for syncope : A. Fib one episode, no symptoms reported. No heart block or significant bradycardia  Carotid duplex 03/27/2018:  Right Carotid: Velocities in the right ICA are consistent with a 1-39% stenosis. Left Carotid: Velocities in the left ICA are consistent with a 1-39% stenosis. Vertebrals:  Bilateral vertebral arteries demonstrate antegrade flow. Subclavians: Normal flow hemodynamics were seen in bilateral subclavian  arteries.   Event Monitor 30 days 11/11/2017: Predominant rhythm is normal sinus rhythm. Symptoms of fatigue reveals normal sinus rhythm. Asymptomatic atrial fibrillation and probable atypical atrial flutter noted on 11/13/2017, 11/18/2017 and 12/09/2017 at 3:00 AM. Occasional PACs. Arrhythmia/PVC burden 6%.   Echo 11/09/2017: Left ventricle: The cavity size was normal. Systolic function was   normal. The estimated ejection fraction was in the range of 60% to 65%. Wall motion was normal; there were no regional wall   motion abnormalities. The study is not technically sufficient to   allow evaluation of LV diastolic function.Pulmonary arteries: PA peak pressure: 32 mm Hg (S).   Coronary angiogram 11/24/2016:  Normal coronaries; tortuous LAD   Loop Recorder   Loop recorder implantation 05/23/2020  Scheduled Remote loop recorder check 06/04/2020: Unremarkable transmission. Occasional PVC. No heart block, no atrial fibrillation. No symptoms reported  Remote scheduled loop recorder transmission 07/19/2020:  There were 4 high ventricular rate episodes, EGM suggest brief atrial fibrillation with RVR. There were no bradycardia or heart block episodes. No symptoms reported. AT/AF burden <1%. Mean ventricular rate during AF = 130 bpm   EKG:   EKG 08/26/2020: Sinus rhythm at a rate of 72 bpm.  Left atrial enlargement.  Left axis, left anterior fascicular block.  Incomplete right bundle branch block.  EKG 05/17/2020: Sinus rhythm at a rate of 60 bpm, left atrial enlargement.  Left axis, left anterior fascicular block.  Nonspecific T wave abnormality.  Compared to EKG 04/19/2020, incomplete right bundle branch block and poor progression not present.  EKG 04/19/2020: Sinus rhythm at a rate of 79 bpm, left atrial enlargement.  Left axis, left anterior fascicular block.  Incomplete right bundle branch block.  Poor R wave progression, cannot exclude anteroseptal infarct old.  Nonspecific T wave abnormality.  Compared to  EKG 02/02/2020, no significant change.  EKG 02/02/2020: Sinus tachycardia at the rate of 103 bpm, left axis deviation, left anterior fascicular block.  Incomplete right bundle branch block.  Poor R wave progression, cannot exclude anteroseptal infarct old.  Nonspecific T abnormality.  No significant change from 06/12/2019.  EKG 06/20/2018: Atrial tachycardia at the rate of 133 bpm.  Left axis deviation, left anterior fascicular block.  Poor R-wave progression.  Incomplete right bundle branch block.  Assessment     ICD-10-CM   1. Precordial pain  R07.2 EKG 12-Lead  2. Essential hypertension, benign  I10   3. Paroxysmal atrial fibrillation (HCC)  I48.0     Meds ordered this encounter  Medications  . metoprolol succinate (TOPROL-XL) 25 MG 24 hr tablet    Sig: Take 1 tablet (25 mg total) by mouth daily. Take with or immediately following a meal.    Dispense:  30 tablet    Refill:  3    There are no discontinued medications.   This patients CHA2DS2-VASc Score 4 (HTN, F, Age)and yearly risk of stroke 4.8%.   Recommendations:   Cynthia [name]  is a 80 y.o. Caucasian female with hypertension, hyperlipidemia, frequent UTI,  paroxysmal atrial fibrillation, atrial tachycardia, obstructive sleep apnea on CPAP. Tolerating anticoagulation well. Flecainide discontinued due to dizziness. In the past she has had recurrent episodes of near syncope and severe orthostatic changes with recurrent UTI.  However patient with episodes of syncope and near syncope not associated with UTI.  Patient underwent surgery to her right foot 03/04/2020 and had an episode of syncope while at rehabilitation facility on 04/10/2020.   Patient was last seen in our office 07/01/2020 at which time she was doing well and recommended for 53-monthfollow-up.  She then called the office 07/14/2020 with concerns of lightheadedness and dizziness, at that time patient was advised to discontinue hydrochlorothiazide.  Patient followed up with  PCP at that time and was diangosed with UTI. Patient now presents for urgent visit with complaints of an episode of chest pain.   Patient EKG today is unchanged compared to previous and symptoms are atypical, although she did report improvement with NTG and Tums. Notably patient's blood pressure is elevated today, which may be contributing to symptoms. Also loop recorder has noted atrial fibrillation, reviewed and discussed this with patient and her husband. She is tolerating Eliquis without bleeding diathesis, will continue this. In view of patient's elevated blood pressure and episodes of a fib with RVR on loop transmission will start her on metoprolol succinate 25 mg once daily. If symptoms failure to improve could consider further ischemic evaluation in future.   Counseled patient and her husband regarding signs and symptoms that would warrant urgent/emergent evaluation.   Follow up in 4 weeks, sooner if needed, for hypertension, chest pain, and atrial fibrillation.    Cynthia Berthold PA-C 08/27/2020, 8:10 AM Office: [phone]

## 2020-08-26 NOTE — Telephone Encounter (Signed)
Patient's husband called that his wife has been experiencing angina for the past 3 days she has been taking her nitroglycerin and it has helped take away the pressure the last time she checked her bp was on Friday 5/13 it was 120/80. Patient also feels dizziness and light headedness patient is scheduled to see CC this afternoon @ 3

## 2020-09-02 DIAGNOSIS — G912 (Idiopathic) normal pressure hydrocephalus: Secondary | ICD-10-CM | POA: Diagnosis not present

## 2020-09-03 ENCOUNTER — Other Ambulatory Visit: Payer: Self-pay | Admitting: Neurological Surgery

## 2020-09-03 DIAGNOSIS — G912 (Idiopathic) normal pressure hydrocephalus: Secondary | ICD-10-CM

## 2020-09-04 ENCOUNTER — Ambulatory Visit
Admission: RE | Admit: 2020-09-04 | Discharge: 2020-09-04 | Disposition: A | Discharge status: Home / Self Care | Payer: Medicare PPO | Source: Ambulatory Visit | Attending: Neurological Surgery | Admitting: Neurological Surgery

## 2020-09-04 ENCOUNTER — Other Ambulatory Visit: Payer: Self-pay

## 2020-09-04 DIAGNOSIS — G912 (Idiopathic) normal pressure hydrocephalus: Secondary | ICD-10-CM | POA: Diagnosis not present

## 2020-09-04 NOTE — Discharge Instructions (Signed)
Lumbar Puncture Discharge Instructions  1. Go home and rest quietly as needed. You may resume normal activities; however, do not exert yourself strongly or do any heavy lifting today and tomorrow.   2. DO NOT drive today.    3. You may resume your normal diet and medications unless otherwise indicated. Drink lots of extra fluids today and tomorrow.   4. The incidence of headache, nausea, or vomiting is about 5% (one in 20 patients).  If you develop a headache, lie flat for 24 hours and drink plenty of fluids until the headache goes away.  Caffeinated beverages may be helpful. If when you get up you still have a headache when standing, go back to bed and force fluids for another 24 hours.   5. If you develop severe nausea and vomiting or a headache that does not go away with the flat bedrest after 48 hours, please call [phone].   6. Call your physician for a follow-up appointment.  The results of your Lumbar Puncture will be sent directly to your physician and they will contact you.   7. If you have any questions or if complications develop after you arrive home, please call [phone].  Discharge instructions have been explained to the patient.  The patient, or the person responsible for the patient, fully understands these instructions.   Thank you for visiting our office today.

## 2020-09-11 ENCOUNTER — Other Ambulatory Visit: Payer: Self-pay | Admitting: Student

## 2020-09-18 ENCOUNTER — Ambulatory Visit (INDEPENDENT_AMBULATORY_CARE_PROVIDER_SITE_OTHER): Payer: Medicare PPO | Admitting: Podiatry

## 2020-09-18 ENCOUNTER — Other Ambulatory Visit: Payer: Self-pay

## 2020-09-18 DIAGNOSIS — Z9181 History of falling: Secondary | ICD-10-CM

## 2020-09-18 DIAGNOSIS — R2689 Other abnormalities of gait and mobility: Secondary | ICD-10-CM

## 2020-09-18 DIAGNOSIS — M25561 Pain in right knee: Secondary | ICD-10-CM | POA: Diagnosis not present

## 2020-09-18 DIAGNOSIS — R2681 Unsteadiness on feet: Secondary | ICD-10-CM | POA: Diagnosis not present

## 2020-09-20 DIAGNOSIS — N390 Urinary tract infection, site not specified: Secondary | ICD-10-CM | POA: Diagnosis not present

## 2020-09-22 NOTE — Progress Notes (Signed)
   I, Peterson Lombard, LAT, ATC acting as a scribe for Lynne Leader, MD.  Cynthia [name] is a 80 y.o. female who presents to [facility] today for f/u of R foot pain.  She was last seen by Dr. Georgina Snell on 06/24/20 and was advised to get custom orthotics, arch supports, and was referred to PT.  Pt has her f/u visit w/ Dr. Posey Pronto on 09/18/20. Today, pt reports R medial arch is bothering her this morning. Swelling and redness is present along the R medial arch.  Additionally, pt c/o UTI symptoms and has a pending culture.  Dx imaging: 05/15/20 R foot XR  03/29/20 R foot XR  06/20/19 R foot XR  Pertinent review of systems: No fevers or chills  Relevant historical information: Hypertension   Exam:  BP 124/80 (BP Location: Left Arm, Patient Position: Sitting, Cuff Size: Normal)   Pulse 73   Ht 5\' 5"  (1.651 m)   Wt 151 lb 12.8 oz (68.9 kg)   SpO2 92%   BMI 25.26 kg/m  General: Well Developed, well nourished, and in no acute distress.   MSK: Right foot significant pes planus with mild ankle subluxation.  Prominent navicular prominence.  Navicular prominence is erythematous with minimal skin breakdown.  Tender to palpation.         Assessment and Plan: 80 y.o. female with right foot pain.  Patient has a very prominent navicular prominence with significant pes planus and medial ankle and foot subluxation. This irritation and erythema is a pressure issue has started just recently.  Plan to offload pressure using metatarsal pads and modification of footwear.  Follow back up with podiatry in the near future if not improving.  Additionally if worsening will refer to wound care.  Recheck back with me in 1 month.  Discussed a variety of options about offloading pressure.    Discussed warning signs or symptoms. Please see discharge instructions. Patient expresses understanding.   The above documentation has been reviewed and is accurate and complete Lynne Leader,  M.D.  Total encounter time 30 minutes including face-to-face time with the patient and, reviewing past medical record, and charting on the date of service.   Treatment plan and options

## 2020-09-23 ENCOUNTER — Ambulatory Visit (INDEPENDENT_AMBULATORY_CARE_PROVIDER_SITE_OTHER): Payer: Medicare PPO | Admitting: Family Medicine

## 2020-09-23 ENCOUNTER — Other Ambulatory Visit: Payer: Self-pay

## 2020-09-23 VITALS — BP 124/80 | HR 73 | Ht 65.0 in | Wt 151.8 lb

## 2020-09-23 DIAGNOSIS — M2141 Flat foot [pes planus] (acquired), right foot: Secondary | ICD-10-CM | POA: Diagnosis not present

## 2020-09-23 DIAGNOSIS — L89891 Pressure ulcer of other site, stage 1: Secondary | ICD-10-CM

## 2020-09-23 DIAGNOSIS — M2142 Flat foot [pes planus] (acquired), left foot: Secondary | ICD-10-CM

## 2020-09-23 NOTE — Patient Instructions (Addendum)
Thank you for coming in today.   Offload the pressure.   Try the metatarsal pad with a post op shoe.   Try the foam adhesive sheets  Lucio Edward [id number] Self Adhesive Foam Sheets - Pack of 20, Class Pack of [employer] for [employer] [place], [place] for [employer], [employer] or [employer]  If you get worse let me know and I will refer you to wound management.   I will ask your podiatrist about orthotics which I think will help.   Please recheck in 1 month.

## 2020-09-24 ENCOUNTER — Encounter: Payer: Self-pay | Admitting: Podiatry

## 2020-09-24 NOTE — Progress Notes (Signed)
Subjective:  [name], female    DOB: 08-08-1940,  MRN: [id number]  Chief Complaint  Patient presents with   Routine Post Op    PT stated that she is doing well     80 y.o. female presents with the above complaint.  Patient presents with new complaint of multiple history of fall from the lower extremity.  She states her surgical site is doing well has completely healed.  She has a history of following 2-3 times over the past few months.  She states that she has more of a shuffling gait when she is ambulating.  She would like to know if there is any kind of bracing available Feels like sugars are good before.  She denies any other acute complaints.  She has not seen anyone else prior to seeing me for the unsteady gait/antalgic gait   Review of Systems: Negative except as noted in the HPI. Denies N/V/F/Ch.  Past Medical History:  Diagnosis Date   Acute blood loss anemia 10/27/2017   Acute GI bleeding 10/27/2017   Anemia    Arthritis    "joints" (11/10/2017)   Dizziness    Encounter for loop recorder check 06/09/2020   GERD (gastroesophageal reflux disease)    GI bleed 11/06/2017   Hearing loss    History of blood transfusion 10/2017   Hyperlipidemia    Hyperthyroidism    Loop recorder Biotronik Loop Recorder 05/23/2020 05/23/2020   Melanoma ([place])    "cut off my back"   Memory loss    Sinus headache    Sleep apnea    Syncope and collapse     Current Outpatient Medications:    acetaminophen (TYLENOL) 500 MG tablet, Take 500 mg by mouth every 6 (six) hours as needed., Disp: , Rfl:    Alpha-Lipoic Acid (LIPOIC ACID PO), Take 600 mg by mouth daily., Disp: , Rfl:    apixaban (ELIQUIS) 5 MG TABS tablet, Take 1 tablet (5 mg total) by mouth 2 (two) times daily. With breakfast and dinner, Disp: 60 tablet, Rfl: 5   atorvastatin (LIPITOR) 10 MG tablet, Take 1 tablet (10 mg total) by mouth daily. At night, Disp: 30 tablet, Rfl: 11   Calcium Carbonate-Vitamin D3 600-400  MG-UNIT TABS, Take 1 tablet by mouth daily., Disp: , Rfl:    cycloSPORINE (RESTASIS) 0.05 % ophthalmic emulsion, Place 1 drop into both eyes 2 (two) times daily. , Disp: , Rfl:    desmopressin (DDAVP) 0.2 MG tablet, TAKE 1 TABLET BY MOUTH NIGHTLY. SLOWLY INCREASE AS NEEDED TO 3 TABLETS NIGHTLY, Disp: , Rfl:    diltiazem (CARDIZEM CD) 120 MG 24 hr capsule, TAKE 1 CAPSULE BY MOUTH EVERY DAY, Disp: 90 capsule, Rfl: 1   DULoxetine (CYMBALTA) 30 MG capsule, TAKE 1 CAPSULE BY MOUTH TWICE A DAY (Patient taking differently: Take 30 mg by mouth 2 (two) times daily.), Disp: 180 capsule, Rfl: 3   fluticasone (FLONASE) 50 MCG/ACT nasal spray, Place 1 spray into the nose daily as needed for allergies. , Disp: , Rfl:    GLUCOSAMINE-CHONDROITIN PO, Take 1 tablet by mouth daily at 12 noon., Disp: , Rfl:    hydrochlorothiazide (HYDRODIURIL) 25 MG tablet, Take 25 mg by mouth in the morning., Disp: , Rfl:    losartan (COZAAR) 100 MG tablet, Take 100 mg by mouth every evening., Disp: , Rfl:    meclizine (ANTIVERT) 25 MG tablet, Take 25 mg by mouth daily as needed for dizziness., Disp: , Rfl:  Melatonin 5 MG CAPS, Take 5 mg by mouth at bedtime. , Disp: , Rfl:    metoprolol succinate (TOPROL-XL) 25 MG 24 hr tablet, TAKE 1 TABLET BY MOUTH DAILY. TAKE WITH OR IMMEDIATELY FOLLOWING A MEAL., Disp: 90 tablet, Rfl: 1   Multiple Vitamin (MULTIVITAMIN WITH MINERALS) TABS tablet, Take 1 tablet by mouth daily., Disp: , Rfl:    nitrofurantoin (MACRODANTIN) 100 MG capsule, Take 100 mg by mouth daily., Disp: , Rfl:    nitroGLYCERIN (NITROLINGUAL) 0.4 MG/SPRAY spray, PLACE 1 SPRAY UNDER THE TONGUE EVERY 5 (FIVE) MINUTES FOR 3 DOSES AS NEEDED FOR CHEST PAIN (Patient taking differently: Place 1 spray under the tongue every 5 (five) minutes x 3 doses as needed for chest pain.), Disp: 12 g, Rfl: 1   Omega-3 1000 MG CAPS, Take 2,000 mg by mouth daily. , Disp: , Rfl:    pantoprazole (PROTONIX) 40 MG tablet, Take 40 mg by mouth daily before  breakfast. , Disp: , Rfl: 1   thyroid (ARMOUR) 60 MG tablet, Take 60 mg by mouth daily before breakfast., Disp: , Rfl:    tiZANidine (ZANAFLEX) 4 MG tablet, Take 1 tablet (4 mg total) by mouth 3 (three) times daily as needed. (Patient taking differently: Take 4 mg by mouth 3 (three) times daily as needed for muscle spasms.), Disp: 90 tablet, Rfl: 5   traMADol (ULTRAM) 50 MG tablet, Take 1 tablet by mouth as needed., Disp: , Rfl:    vitamin C (ASCORBIC ACID) 500 MG tablet, Take 500 mg by mouth daily. , Disp: , Rfl:   Social History   Tobacco Use  Smoking Status Former   Years: 2.00   Pack years: 0.00   Types: Cigarettes   Quit date: 1980   Years since quitting: 42.4  Smokeless Tobacco Never  Tobacco Comments   11/10/2017 "only smoked when I was on my period"    Allergies  Allergen Reactions   Flecainide Other (See Comments)    Severe dizziness   Avelox [Moxifloxacin Hcl In Nacl] Other (See Comments)    Unknown reaction   Other Other (See Comments)    Other reaction(s): flu like symptoms   Oxycodone Other (See Comments)    uphoria   Alendronate Sodium Nausea Only   Objective:  There were no vitals filed for this visit. There is no height or weight on file to calculate BMI. Constitutional Well developed. Well nourished.  Vascular Dorsalis pedis pulses palpable bilaterally. Posterior tibial pulses palpable bilaterally. Capillary refill normal to all digits.  No cyanosis or clubbing noted. Pedal hair growth normal.  Neurologic Normal speech. Oriented to person, place, and time. Epicritic sensation to light touch grossly present bilaterally.  Dermatologic Nails well groomed and normal in appearance. No open wounds. No skin lesions.  Orthopedic: Gait examination shows shuffled gait with no heel or toe pushoff.  Pes planovalgus foot structure noted.  No ulcers or lesions noted.  Incision sites are healed.   Radiographs: None Assessment:   1. Antalgic gait   2. Gait  instability   3. History of falling    Plan:  Patient was evaluated and treated and all questions answered.  Bilateral shuffle gait/antalgic gait with a history of multiple falls -I explained the patient the etiology of antalgic gait and various treatment options were discussed.  Given that she has a history of multiple force due to on balance/unsteady gait I believe she will benefit from worse balance bracing. -She will be scheduled to see EJ for moore balance  bracing fitting, I believe that this will help guide her with steady gait and prevent falls.  She states understanding would like to proceed with obtaining the balance bracing  No follow-ups on file.

## 2020-09-25 DIAGNOSIS — N390 Urinary tract infection, site not specified: Secondary | ICD-10-CM | POA: Diagnosis not present

## 2020-09-25 DIAGNOSIS — F0390 Unspecified dementia without behavioral disturbance: Secondary | ICD-10-CM | POA: Diagnosis not present

## 2020-09-26 ENCOUNTER — Encounter: Payer: Self-pay | Admitting: Student

## 2020-09-26 ENCOUNTER — Other Ambulatory Visit: Payer: Self-pay

## 2020-09-26 ENCOUNTER — Ambulatory Visit: Payer: Medicare PPO | Admitting: Student

## 2020-09-26 VITALS — BP 118/73 | HR 82 | Temp 98.1°F | Ht 65.0 in | Wt 150.0 lb

## 2020-09-26 DIAGNOSIS — I10 Essential (primary) hypertension: Secondary | ICD-10-CM

## 2020-09-26 DIAGNOSIS — I480 Paroxysmal atrial fibrillation: Secondary | ICD-10-CM

## 2020-09-26 DIAGNOSIS — R072 Precordial pain: Secondary | ICD-10-CM | POA: Diagnosis not present

## 2020-09-26 NOTE — Progress Notes (Signed)
Primary Physician/Referring:  Deland Pretty, MD  [name], female    DOB: 11-03-1940, 80 y.o.   MRN: [id number]  Chief Complaint  Patient presents with   Hypertension   Hyperlipidemia   Atrial Fibrillation   HPI:    Cynthia [name]  is a 80 y.o. Caucasian female with hypertension, hyperlipidemia, frequent UTI,  paroxysmal atrial fibrillation, atrial tachycardia, obstructive sleep apnea on CPAP. Tolerating anticoagulation well. Flecainide discontinued due to dizziness. In the past she has had recurrent episodes of near syncope and severe orthostatic changes with recurrent UTI.  However patient with episodes of syncope and near syncope not associated with UTI.  Patient underwent surgery to her right foot 03/04/2020 and had an episode of syncope while at rehabilitation facility on 04/10/2020.   Patient presents for 4-week follow-up of hypertension, chest pain, and atrial fibrillation.  At last visit added metoprolol succinate 25 mg once daily, which she is tolerating without issue.  Patient's primary concern today is that she feels she is having UTI symptoms which causes her fatigue and episodes of dizziness.  She also states she has had improvement of chest discomfort symptoms since last visit, although she has had a single brief episode of chest pain which was relieved with nitroglycerin.  Otherwise patient denies palpitations, dyspnea, dizziness, syncope, near syncope.  Patient has had no recurrence of syncope or near syncope.  She is tolerating anticoagulation without bleeding diathesis. Loop recorder transmission did reveal atrial fibrillation with RVR.   Past Medical History:  Diagnosis Date   Acute blood loss anemia 10/27/2017   Acute GI bleeding 10/27/2017   Anemia    Arthritis    "joints" (11/10/2017)   Dizziness    Encounter for loop recorder check 06/09/2020   GERD (gastroesophageal reflux disease)    GI bleed 11/06/2017   Hearing loss    History of blood  transfusion 10/2017   Hyperlipidemia    Hyperthyroidism    Loop recorder Biotronik Loop Recorder 05/23/2020 05/23/2020   Melanoma ([place])    "cut off my back"   Memory loss    Sinus headache    Sleep apnea    Syncope and collapse    Past Surgical History:  Procedure Laterality Date   ABDOMINAL HYSTERECTOMY     "partial"   APPENDECTOMY     BIOPSY  11/17/2017   Procedure: BIOPSY;  Surgeon: Clarene Essex, MD;  Location: [facility];  Service: Endoscopy;;   COLONOSCOPY WITH PROPOFOL N/A 11/17/2017   Procedure: COLONOSCOPY WITH PROPOFOL;  Surgeon: Clarene Essex, MD;  Location: [facility];  Service: Endoscopy;  Laterality: N/A;   CRANIECTOMY FOR EXCISION OF ACOUSTIC NEUROMA     ESOPHAGOGASTRODUODENOSCOPY (EGD) WITH PROPOFOL N/A 10/28/2017   Procedure: ESOPHAGOGASTRODUODENOSCOPY (EGD) WITH PROPOFOL;  Surgeon: Clarene Essex, MD;  Location: [facility];  Service: Endoscopy;  Laterality: N/A;   FOOT SURGERY Bilateral 04/18/2020   KNEE ARTHROSCOPY Left    LEFT HEART CATH AND CORONARY ANGIOGRAPHY N/A 11/24/2016   Procedure: LEFT HEART CATH AND CORONARY ANGIOGRAPHY;  Surgeon: Jettie Booze, MD;  Location: [facility];  Service: Cardiovascular;  Laterality: N/A;   MELANOMA EXCISION     "off my back"   NASAL SINUS SURGERY     TONSILLECTOMY     Family History  Problem Relation Age of Onset   Congestive Heart Failure Mother    Heart disease Father        History of heart attacks at a later age.  Heart disease Brother    Hearing loss Brother     Social History   Tobacco Use   Smoking status: Former    Years: 2.00    Pack years: 0.00    Types: Cigarettes    Quit date: 1980    Years since quitting: 42.4   Smokeless tobacco: Never   Tobacco comments:    11/10/2017 "only smoked when I was on my period"  Substance Use Topics   Alcohol use: Yes    Comment: 11/10/2017 "nothing lately; did have 1 glass of wine q night"  Marital Status: Married   ROS  Review of Systems   Constitutional: Negative for malaise/fatigue and weight gain.  Cardiovascular:  Positive for chest pain (single brief episode relieved with NTG). Negative for claudication, dyspnea on exertion, leg swelling, near-syncope, orthopnea, palpitations, paroxysmal nocturnal dyspnea and syncope.  Respiratory:  Negative for shortness of breath.   Hematologic/Lymphatic: Does not bruise/bleed easily.  Gastrointestinal:  Negative for melena.  Neurological:  Negative for dizziness, light-headedness and weakness.  Objective  Blood pressure 118/73, pulse 82, temperature 98.1 F (36.7 C), height '5\' 5"'  (1.651 m), weight 150 lb (68 kg), SpO2 97 %.  Vitals with BMI 09/26/2020 09/23/2020 09/04/2020  Height '5\' 5"'  '5\' 5"'  -  Weight 150 lbs 151 lbs 13 oz -  BMI 07.62 26.33 -  Systolic 354 562 563  Diastolic 73 80 74  Pulse 82 73 67    Physical Exam Vitals reviewed.  Constitutional:      General: She is not in acute distress. Cardiovascular:     Rate and Rhythm: Normal rate and regular rhythm.     Pulses: Intact distal pulses.     Heart sounds: Normal heart sounds, S1 normal and S2 normal. No murmur heard.   No gallop.     Comments: No leg edema, no JVD. Pulmonary:     Effort: Pulmonary effort is normal. No respiratory distress.     Breath sounds: Normal breath sounds. No wheezing, rhonchi or rales.  Musculoskeletal:     Right lower leg: No edema.     Left lower leg: No edema.  Skin:    General: Skin is warm and dry.  Neurological:     Mental Status: She is alert.  Physical exam unchanged compared to previous   Laboratory examination:   Recent Labs    03/25/20 1308 03/26/20 0659 04/10/20 1034  NA 129* 132* 144  K 3.3* 3.4* 3.2*  CL 93* 97* 107  CO2 '24 22 27  ' GLUCOSE 128* 114* 105*  BUN '16 11 19  ' CREATININE 0.68 0.57 0.66  CALCIUM 9.1 9.9 9.3  GFRNONAA >60 >60 >60   CrCl cannot be calculated (Patient's most recent lab result is older than the maximum 21 days allowed.).  CMP Latest Ref  Rng & Units 04/10/2020 03/26/2020 03/25/2020  Glucose 70 - 99 mg/dL 105(H) 114(H) 128(H)  BUN 8 - 23 mg/dL '19 11 16  ' Creatinine 0.44 - 1.00 mg/dL 0.66 0.57 0.68  Sodium 135 - 145 mmol/L 144 132(L) 129(L)  Potassium 3.5 - 5.1 mmol/L 3.2(L) 3.4(L) 3.3(L)  Chloride 98 - 111 mmol/L 107 97(L) 93(L)  CO2 22 - 32 mmol/L '27 22 24  ' Calcium 8.9 - 10.3 mg/dL 9.3 9.9 9.1  Total Protein 6.5 - 8.1 g/dL 6.1(L) - -  Total Bilirubin 0.3 - 1.2 mg/dL 0.8 - -  Alkaline Phos 38 - 126 U/L 47 - -  AST 15 - 41 U/L 20 - -  ALT  0 - 44 U/L 19 - -   CBC Latest Ref Rng & Units 04/10/2020 03/25/2020 03/24/2020  WBC 4.0 - 10.5 K/uL 6.2 8.2 8.8  Hemoglobin 12.0 - 15.0 g/dL 11.8(L) 12.6 12.4  Hematocrit 36.0 - 46.0 % 35.7(L) 34.8(L) 35.8(L)  Platelets 150 - 400 K/uL 280 245 278   Lipid Panel     Component Value Date/Time   CHOL 165 11/24/2016 0040   TRIG 88 11/24/2016 0040   HDL 61 11/24/2016 0040   CHOLHDL 2.7 11/24/2016 0040   VLDL 18 11/24/2016 0040   LDLCALC 86 11/24/2016 0040   HEMOGLOBIN A1C Lab Results  Component Value Date   HGBA1C 5.6 11/24/2016   MPG 114 11/24/2016   TSH Recent Labs    03/21/20 1219  TSH 0.974    External labs:  06/13/2020: Glucose 89, sodium 141, potassium 3.8, BUN 34, creatinine 0.8, GFR >60, AST 16, ALT 23, alk phos 40  03/11/2020: Hemoglobin 10.9, hematocrit 34.2, platelet 241, MCV 94.7 Total cholesterol 165, triglycerides 56, HDL 65, LDL 89 TSH 0.24  Cholesterol, total 145.000 m 09/01/2019 HDL 67.000 mg 10/13/2019 LDL-C 94.000 cal 10/13/2019 Triglycerides 80.000 mg 10/13/2019  Hemoglobin 10.900 g/d 01/24/2020 INR 1.160 11/06/2017 Platelets 197.000 K/ 01/24/2020  Creatinine, Serum 0.580 mg/ 01/24/2020 Potassium 3.500 mm 01/24/2020 ALT (SGPT) 15.000 IU/ 09/01/2019  TSH 0.870 10/13/2019  Hemoglobin 9.300 12/14/2018;  INR 1.160 11/06/2017 Platelets 188.000 12/14/2018  Creatinine, Serum 0.970 12/14/2018 Potassium 4.000 12/14/2018 Magnesium N/D ALT (SGPT) 15.000  12/14/2018  Labs 11/02/2018: BUN 26, creatinine 0.9, EGFR greater than 60 mL, Hb 10.6/HCT 32.2, platelets 244.   Medications and allergies   Allergies  Allergen Reactions   Flecainide Other (See Comments)    Severe dizziness   Avelox [Moxifloxacin Hcl In Nacl] Other (See Comments)    Unknown reaction   Other Other (See Comments)    Other reaction(s): flu like symptoms   Oxycodone Other (See Comments)    uphoria   Alendronate Sodium Nausea Only    Current Outpatient Medications on File Prior to Visit  Medication Sig Dispense Refill   acetaminophen (TYLENOL) 500 MG tablet Take 500 mg by mouth daily.     Alpha-Lipoic Acid (LIPOIC ACID PO) Take 600 mg by mouth daily.     apixaban (ELIQUIS) 5 MG TABS tablet Take 1 tablet (5 mg total) by mouth 2 (two) times daily. With breakfast and dinner 60 tablet 5   atorvastatin (LIPITOR) 10 MG tablet Take 1 tablet (10 mg total) by mouth daily. At night 30 tablet 11   Calcium Carbonate-Vitamin D3 600-400 MG-UNIT TABS Take 1 tablet by mouth daily.     desmopressin (DDAVP) 0.2 MG tablet TAKE 1 TABLET BY MOUTH NIGHTLY. SLOWLY INCREASE AS NEEDED TO 3 TABLETS NIGHTLY     diltiazem (CARDIZEM CD) 120 MG 24 hr capsule TAKE 1 CAPSULE BY MOUTH EVERY DAY 90 capsule 1   DULoxetine (CYMBALTA) 30 MG capsule TAKE 1 CAPSULE BY MOUTH TWICE A DAY (Patient taking differently: Take 30 mg by mouth 2 (two) times daily.) 180 capsule 3   fluticasone (FLONASE) 50 MCG/ACT nasal spray Place 1 spray into the nose daily as needed for allergies.      GLUCOSAMINE-CHONDROITIN PO Take 1 tablet by mouth daily at 12 noon.     hydrochlorothiazide (HYDRODIURIL) 25 MG tablet Take 25 mg by mouth in the morning.     losartan (COZAAR) 100 MG tablet Take 100 mg by mouth every evening.     meclizine (ANTIVERT) 25 MG tablet  Take 25 mg by mouth daily as needed for dizziness.     Melatonin 5 MG CAPS Take 5 mg by mouth at bedtime.      metoprolol succinate (TOPROL-XL) 25 MG 24 hr tablet TAKE 1 TABLET  BY MOUTH DAILY. TAKE WITH OR IMMEDIATELY FOLLOWING A MEAL. 90 tablet 1   Multiple Vitamin (MULTIVITAMIN WITH MINERALS) TABS tablet Take 1 tablet by mouth daily.     nitrofurantoin (MACRODANTIN) 100 MG capsule Take 100 mg by mouth daily.     nitroGLYCERIN (NITROLINGUAL) 0.4 MG/SPRAY spray PLACE 1 SPRAY UNDER THE TONGUE EVERY 5 (FIVE) MINUTES FOR 3 DOSES AS NEEDED FOR CHEST PAIN (Patient taking differently: Place 1 spray under the tongue every 5 (five) minutes x 3 doses as needed for chest pain.) 12 g 1   Omega-3 1000 MG CAPS Take 2,000 mg by mouth daily.      pantoprazole (PROTONIX) 40 MG tablet Take 40 mg by mouth daily before breakfast.   1   thyroid (ARMOUR) 60 MG tablet Take 60 mg by mouth daily before breakfast.     tiZANidine (ZANAFLEX) 4 MG tablet Take 1 tablet (4 mg total) by mouth 3 (three) times daily as needed. (Patient taking differently: Take 4 mg by mouth 3 (three) times daily as needed for muscle spasms.) 90 tablet 5   traMADol (ULTRAM) 50 MG tablet Take 1 tablet by mouth as needed.     vitamin C (ASCORBIC ACID) 500 MG tablet Take 500 mg by mouth daily.      cycloSPORINE (RESTASIS) 0.05 % ophthalmic emulsion Place 1 drop into both eyes 2 (two) times daily.  (Patient not taking: Reported on 09/26/2020)     No current facility-administered medications on file prior to visit.    Radiology:   No results found.  Cardiac Studies:   Event Monitor for 30 days Start date 07/01/2018 for syncope : A. Fib one episode, no symptoms reported. No heart block or significant bradycardia  Carotid duplex 03/27/2018:  Right Carotid: Velocities in the right ICA are consistent with a 1-39% stenosis. Left Carotid: Velocities in the left ICA are consistent with a 1-39% stenosis. Vertebrals:  Bilateral vertebral arteries demonstrate antegrade flow. Subclavians: Normal flow hemodynamics were seen in bilateral subclavian arteries.   Event Monitor 30 days 11/11/2017: Predominant rhythm is normal sinus  rhythm. Symptoms of fatigue reveals normal sinus rhythm. Asymptomatic atrial fibrillation and probable atypical atrial flutter noted on 11/13/2017, 11/18/2017 and 12/09/2017 at 3:00 AM. Occasional PACs. Arrhythmia/PVC burden 6%.   Echo 11/09/2017: Left ventricle: The cavity size was normal. Systolic function was   normal. The estimated ejection fraction was in the range of 60% to 65%. Wall motion was normal; there were no regional wall   motion abnormalities. The study is not technically sufficient to   allow evaluation of LV diastolic function.Pulmonary arteries: PA peak pressure: 32 mm Hg (S).   Coronary angiogram 11/24/2016:  Normal coronaries; tortuous LAD   Loop Recorder   Loop recorder implantation 05/23/2020  Scheduled Remote loop recorder check 06/04/2020: Unremarkable transmission. Occasional PVC. No heart block, no atrial fibrillation. No symptoms reported  Remote scheduled loop recorder transmission 07/19/2020:  There were 4 high ventricular rate episodes, EGM suggest brief atrial fibrillation with RVR. There were no bradycardia or heart block episodes. No symptoms reported. AT/AF burden <1%. Mean ventricular rate during AF = 130 bpm   EKG:   EKG 08/26/2020: Sinus rhythm at a rate of 72 bpm.  Left atrial enlargement.  Left axis,  left anterior fascicular block.  Incomplete right bundle branch block.  EKG 05/17/2020: Sinus rhythm at a rate of 60 bpm, left atrial enlargement.  Left axis, left anterior fascicular block.  Nonspecific T wave abnormality.  Compared to EKG 04/19/2020, incomplete right bundle branch block and poor progression not present.  EKG 04/19/2020: Sinus rhythm at a rate of 79 bpm, left atrial enlargement.  Left axis, left anterior fascicular block.  Incomplete right bundle branch block.  Poor R wave progression, cannot exclude anteroseptal infarct old.  Nonspecific T wave abnormality.  Compared to EKG 02/02/2020, no significant change.  EKG 02/02/2020: Sinus tachycardia at the  rate of 103 bpm, left axis deviation, left anterior fascicular block.  Incomplete right bundle branch block.  Poor R wave progression, cannot exclude anteroseptal infarct old.  Nonspecific T abnormality.  No significant change from 06/12/2019.  EKG 06/20/2018: Atrial tachycardia at the rate of 133 bpm.  Left axis deviation, left anterior fascicular block.  Poor R-wave progression.  Incomplete right bundle branch block.  Assessment     ICD-10-CM   1. Precordial pain  R07.2 PCV ECHOCARDIOGRAM COMPLETE    PCV MYOCARDIAL PERFUSION WITH LEXISCAN    2. Essential hypertension, benign  I10     3. Paroxysmal atrial fibrillation (HCC)  I48.0 PCV ECHOCARDIOGRAM COMPLETE    PCV MYOCARDIAL PERFUSION WITH LEXISCAN      No orders of the defined types were placed in this encounter.   There are no discontinued medications.   This patients CHA2DS2-VASc Score 4 (HTN, F, Age)and yearly risk of stroke 4.8%.   Recommendations:   Cynthia [name]  is a 80 y.o. Caucasian female with hypertension, hyperlipidemia, frequent UTI,  paroxysmal atrial fibrillation, atrial tachycardia, obstructive sleep apnea on CPAP. Tolerating anticoagulation well. Flecainide discontinued due to dizziness. In the past she has had recurrent episodes of near syncope and severe orthostatic changes with recurrent UTI.  However patient with episodes of syncope and near syncope not associated with UTI.  Patient underwent surgery to her right foot 03/04/2020 and had an episode of syncope while at rehabilitation facility on 04/10/2020.   Patient presents for 4-week follow-up of hypertension, chest pain, and atrial fibrillation.  At last visit added metoprolol succinate 25 mg once daily.  Patient is presently doing relatively well from a cardiovascular standpoint with blood pressure well controlled no recurrence of near syncopal episodes.  Patient does continue to have episodes of fatigue and dizziness which she associates with UTI symptoms,  however underlying atrial fibrillation with RVR cannot be ruled out.  Recommend patient undergo further ischemic evaluation to investigate if there is an ischemic underlying etiology for patient's atrial fibrillation.  We will also obtain echocardiogram at this time to further evaluate patient's LV systolic function.   Following nuclear stress test could consider addition of antiarrhythmic medication to reduce A. fib burden and potentially improve patient's symptoms particularly of fatigue, dizziness, intermittent chest pain.  Would like to get patient CAD status prior to initiation of antiarrhythmic therapy.  Could consider Multaq versus Rythmol. She is tolerating Eliquis without bleeding diathesis, will continue this.   Follow-up in 3 months, sooner if needed, for results of cardiac testing and paroxysmal atrial fibrillation.   Cynthia Berthold, PA-C 09/26/2020, 2:44 PM Office: [phone]

## 2020-10-08 DIAGNOSIS — R399 Unspecified symptoms and signs involving the genitourinary system: Secondary | ICD-10-CM | POA: Diagnosis not present

## 2020-10-10 DIAGNOSIS — Z95818 Presence of other cardiac implants and grafts: Secondary | ICD-10-CM | POA: Diagnosis not present

## 2020-10-10 DIAGNOSIS — Z4509 Encounter for adjustment and management of other cardiac device: Secondary | ICD-10-CM | POA: Diagnosis not present

## 2020-10-10 DIAGNOSIS — R55 Syncope and collapse: Secondary | ICD-10-CM | POA: Diagnosis not present

## 2020-10-17 ENCOUNTER — Other Ambulatory Visit: Payer: Medicare PPO

## 2020-10-17 ENCOUNTER — Other Ambulatory Visit: Payer: Self-pay

## 2020-10-17 DIAGNOSIS — M810 Age-related osteoporosis without current pathological fracture: Secondary | ICD-10-CM | POA: Diagnosis not present

## 2020-10-17 DIAGNOSIS — R21 Rash and other nonspecific skin eruption: Secondary | ICD-10-CM | POA: Diagnosis not present

## 2020-10-17 DIAGNOSIS — N3000 Acute cystitis without hematuria: Secondary | ICD-10-CM | POA: Diagnosis not present

## 2020-10-18 ENCOUNTER — Other Ambulatory Visit: Payer: Medicare PPO

## 2020-10-21 NOTE — Progress Notes (Signed)
   I, Cynthia [name], LAT, ATC, am serving as scribe for Dr. Lynne [name].  Cynthia [name] is a 80 y.o. female who presents to [facility] [facility] today for f/u of R foot / medial arch pain due to a pressure wound.  She was last seen by Dr. Georgina [name] on 09/23/20 and was shown how to use MT pads to offload the area while weight-bearing.  She was also advised to purchase a post-op shoe.  Since her last visit, pt reports that her R foot is doing somewhat better.  She con't to use the MT pads in her shoes and feels that these help decrease the pain w/ walking.  Diagnostic imaging: R foot XR- 06/04/20, 03/29/20  Pertinent review of systems: No fevers or chills  Relevant historical information: Hypertension   Exam:  BP 108/64 (BP Location: Right Arm, Patient Position: Sitting, Cuff Size: Normal)   Pulse 71   Ht 5\' 5"  (1.651 m)   Wt 149 lb 9.6 oz (67.9 kg)   SpO2 96%   BMI 24.89 kg/m  General: Well Developed, well nourished, and in no acute distress.   MSK: Right foot significant pes planus and ankle pronation with prominent navicular prominence mildly erythematous and nontender.   Navicular prominence float created using adhesive foam pads.   Assessment and Plan: 80 y.o. female with tenderness overlying navicular prominence.  Improved today with adhesive pad creating a float to offload pressure.  I taught Cynthia [name] and her husband how to do this themselves and provided some materials and instructions on how to buy some more materials.  This should help quite a bit.  Recheck in 3 months or sooner if needed.   Discussed warning signs or symptoms. Please see discharge instructions. Patient expresses understanding.   The above documentation has been reviewed and is accurate and complete Cynthia [name], M.D.

## 2020-10-22 ENCOUNTER — Other Ambulatory Visit: Payer: Self-pay | Admitting: Neurology

## 2020-10-23 ENCOUNTER — Ambulatory Visit (INDEPENDENT_AMBULATORY_CARE_PROVIDER_SITE_OTHER): Payer: Medicare PPO | Admitting: Family Medicine

## 2020-10-23 ENCOUNTER — Other Ambulatory Visit: Payer: Self-pay

## 2020-10-23 ENCOUNTER — Encounter: Payer: Self-pay | Admitting: Family Medicine

## 2020-10-23 VITALS — BP 108/64 | HR 71 | Ht 65.0 in | Wt 149.6 lb

## 2020-10-23 DIAGNOSIS — M2141 Flat foot [pes planus] (acquired), right foot: Secondary | ICD-10-CM | POA: Diagnosis not present

## 2020-10-23 DIAGNOSIS — L89891 Pressure ulcer of other site, stage 1: Secondary | ICD-10-CM | POA: Diagnosis not present

## 2020-10-23 DIAGNOSIS — M2142 Flat foot [pes planus] (acquired), left foot: Secondary | ICD-10-CM | POA: Diagnosis not present

## 2020-10-23 NOTE — Patient Instructions (Signed)
Thank you for coming in today.   Try the little lift using the adhesive foam sheets.   I am happy to do this again with other shoes in the future.   Recheck 3 months or as needed.   Lucio Edward [id number] Self Adhesive Foam Sheets - Pack of 20, Class Pack of Craft Pages for [employer] and [employer], Byars for [employer], [employer] or [employer]!

## 2020-10-24 ENCOUNTER — Telehealth: Payer: Self-pay | Admitting: Podiatry

## 2020-10-24 NOTE — Telephone Encounter (Signed)
Per Allie @ [facility] [facility] no Josem Kaufmann is needed for cpt L1940/L2820/L2330.Marland Kitchen valid and billable codes covered @ 96%. Deductible is 180.00(pt has met) out of pocket is 3500.00(met 533.55) pt has 4% coinsurance.. ref # [id number].Marland Kitchen

## 2020-10-28 DIAGNOSIS — R399 Unspecified symptoms and signs involving the genitourinary system: Secondary | ICD-10-CM | POA: Diagnosis not present

## 2020-11-10 DIAGNOSIS — Z4509 Encounter for adjustment and management of other cardiac device: Secondary | ICD-10-CM | POA: Diagnosis not present

## 2020-11-10 DIAGNOSIS — R55 Syncope and collapse: Secondary | ICD-10-CM | POA: Diagnosis not present

## 2020-11-10 DIAGNOSIS — Z95818 Presence of other cardiac implants and grafts: Secondary | ICD-10-CM | POA: Diagnosis not present

## 2020-11-10 NOTE — Progress Notes (Signed)
Note created in error.

## 2020-11-12 DIAGNOSIS — F419 Anxiety disorder, unspecified: Secondary | ICD-10-CM | POA: Insufficient documentation

## 2020-11-12 DIAGNOSIS — N952 Postmenopausal atrophic vaginitis: Secondary | ICD-10-CM | POA: Diagnosis not present

## 2020-11-12 DIAGNOSIS — I8393 Asymptomatic varicose veins of bilateral lower extremities: Secondary | ICD-10-CM | POA: Insufficient documentation

## 2020-11-12 DIAGNOSIS — Z974 Presence of external hearing-aid: Secondary | ICD-10-CM | POA: Insufficient documentation

## 2020-11-12 DIAGNOSIS — R351 Nocturia: Secondary | ICD-10-CM | POA: Diagnosis not present

## 2020-11-12 DIAGNOSIS — I8390 Asymptomatic varicose veins of unspecified lower extremity: Secondary | ICD-10-CM | POA: Insufficient documentation

## 2020-11-12 DIAGNOSIS — H9190 Unspecified hearing loss, unspecified ear: Secondary | ICD-10-CM | POA: Insufficient documentation

## 2020-11-12 DIAGNOSIS — E049 Nontoxic goiter, unspecified: Secondary | ICD-10-CM | POA: Insufficient documentation

## 2020-11-12 DIAGNOSIS — M810 Age-related osteoporosis without current pathological fracture: Secondary | ICD-10-CM | POA: Insufficient documentation

## 2020-11-12 DIAGNOSIS — R296 Repeated falls: Secondary | ICD-10-CM | POA: Insufficient documentation

## 2020-11-12 DIAGNOSIS — Z9049 Acquired absence of other specified parts of digestive tract: Secondary | ICD-10-CM | POA: Insufficient documentation

## 2020-11-12 DIAGNOSIS — D508 Other iron deficiency anemias: Secondary | ICD-10-CM | POA: Insufficient documentation

## 2020-11-12 DIAGNOSIS — N390 Urinary tract infection, site not specified: Secondary | ICD-10-CM | POA: Diagnosis not present

## 2020-11-12 DIAGNOSIS — R5381 Other malaise: Secondary | ICD-10-CM | POA: Insufficient documentation

## 2020-11-12 DIAGNOSIS — G9332 Myalgic encephalomyelitis/chronic fatigue syndrome: Secondary | ICD-10-CM | POA: Insufficient documentation

## 2020-11-12 DIAGNOSIS — R300 Dysuria: Secondary | ICD-10-CM | POA: Diagnosis not present

## 2020-11-13 ENCOUNTER — Telehealth: Payer: Self-pay | Admitting: Podiatry

## 2020-11-13 NOTE — Telephone Encounter (Signed)
Braces in.. lvm for pt to call to schedule an appt to pick them up.. needs to be with EJ.Marland Kitchen

## 2020-11-20 ENCOUNTER — Other Ambulatory Visit: Payer: Self-pay

## 2020-11-20 ENCOUNTER — Ambulatory Visit: Payer: Medicare PPO

## 2020-11-20 DIAGNOSIS — I480 Paroxysmal atrial fibrillation: Secondary | ICD-10-CM | POA: Diagnosis not present

## 2020-11-20 DIAGNOSIS — R072 Precordial pain: Secondary | ICD-10-CM | POA: Diagnosis not present

## 2020-11-25 NOTE — Progress Notes (Signed)
Low risk stress test. Will discuss further at upcoming office visit.

## 2020-11-26 NOTE — Progress Notes (Signed)
Called and spoke with patient regarding her stress test results.

## 2020-12-02 DIAGNOSIS — L905 Scar conditions and fibrosis of skin: Secondary | ICD-10-CM | POA: Diagnosis not present

## 2020-12-02 DIAGNOSIS — L570 Actinic keratosis: Secondary | ICD-10-CM | POA: Diagnosis not present

## 2020-12-02 DIAGNOSIS — D225 Melanocytic nevi of trunk: Secondary | ICD-10-CM | POA: Diagnosis not present

## 2020-12-02 DIAGNOSIS — L814 Other melanin hyperpigmentation: Secondary | ICD-10-CM | POA: Diagnosis not present

## 2020-12-02 DIAGNOSIS — Z85820 Personal history of malignant melanoma of skin: Secondary | ICD-10-CM | POA: Diagnosis not present

## 2020-12-02 DIAGNOSIS — L821 Other seborrheic keratosis: Secondary | ICD-10-CM | POA: Diagnosis not present

## 2020-12-02 DIAGNOSIS — D1801 Hemangioma of skin and subcutaneous tissue: Secondary | ICD-10-CM | POA: Diagnosis not present

## 2020-12-04 DIAGNOSIS — M545 Low back pain, unspecified: Secondary | ICD-10-CM | POA: Diagnosis not present

## 2020-12-04 DIAGNOSIS — M25561 Pain in right knee: Secondary | ICD-10-CM | POA: Diagnosis not present

## 2020-12-04 DIAGNOSIS — M25562 Pain in left knee: Secondary | ICD-10-CM | POA: Diagnosis not present

## 2020-12-09 DIAGNOSIS — M25551 Pain in right hip: Secondary | ICD-10-CM | POA: Diagnosis not present

## 2020-12-09 DIAGNOSIS — M7061 Trochanteric bursitis, right hip: Secondary | ICD-10-CM | POA: Diagnosis not present

## 2020-12-12 ENCOUNTER — Ambulatory Visit: Payer: Medicare PPO | Admitting: Neurology

## 2020-12-17 DIAGNOSIS — M545 Low back pain, unspecified: Secondary | ICD-10-CM | POA: Insufficient documentation

## 2020-12-17 DIAGNOSIS — R2689 Other abnormalities of gait and mobility: Secondary | ICD-10-CM | POA: Diagnosis not present

## 2020-12-17 DIAGNOSIS — M25551 Pain in right hip: Secondary | ICD-10-CM | POA: Diagnosis not present

## 2020-12-19 ENCOUNTER — Other Ambulatory Visit: Payer: Self-pay

## 2020-12-19 ENCOUNTER — Ambulatory Visit: Payer: Medicare PPO

## 2020-12-19 DIAGNOSIS — M25551 Pain in right hip: Secondary | ICD-10-CM | POA: Diagnosis not present

## 2020-12-19 DIAGNOSIS — I480 Paroxysmal atrial fibrillation: Secondary | ICD-10-CM

## 2020-12-19 DIAGNOSIS — R2689 Other abnormalities of gait and mobility: Secondary | ICD-10-CM | POA: Diagnosis not present

## 2020-12-19 DIAGNOSIS — R072 Precordial pain: Secondary | ICD-10-CM | POA: Diagnosis not present

## 2020-12-19 DIAGNOSIS — M545 Low back pain, unspecified: Secondary | ICD-10-CM | POA: Diagnosis not present

## 2020-12-23 ENCOUNTER — Ambulatory Visit: Payer: Medicare PPO | Admitting: Student

## 2020-12-23 DIAGNOSIS — Z23 Encounter for immunization: Secondary | ICD-10-CM | POA: Diagnosis not present

## 2020-12-23 DIAGNOSIS — J069 Acute upper respiratory infection, unspecified: Secondary | ICD-10-CM | POA: Diagnosis not present

## 2020-12-23 DIAGNOSIS — R42 Dizziness and giddiness: Secondary | ICD-10-CM | POA: Diagnosis not present

## 2020-12-23 NOTE — Progress Notes (Signed)
Pt aware.

## 2020-12-23 NOTE — Progress Notes (Signed)
Echocardiogram unchanged compared to previous in 2019.

## 2020-12-25 ENCOUNTER — Other Ambulatory Visit: Payer: Self-pay

## 2020-12-25 ENCOUNTER — Emergency Department (HOSPITAL_COMMUNITY)
Admission: EM | Admit: 2020-12-25 | Discharge: 2020-12-25 | Disposition: A | Discharge status: Home / Self Care | Payer: Medicare PPO | Attending: Emergency Medicine | Admitting: Emergency Medicine

## 2020-12-25 ENCOUNTER — Encounter (HOSPITAL_COMMUNITY): Payer: Self-pay | Admitting: Emergency Medicine

## 2020-12-25 DIAGNOSIS — I10 Essential (primary) hypertension: Secondary | ICD-10-CM | POA: Diagnosis not present

## 2020-12-25 DIAGNOSIS — R42 Dizziness and giddiness: Secondary | ICD-10-CM | POA: Diagnosis not present

## 2020-12-25 DIAGNOSIS — R1110 Vomiting, unspecified: Secondary | ICD-10-CM | POA: Diagnosis not present

## 2020-12-25 DIAGNOSIS — R531 Weakness: Secondary | ICD-10-CM | POA: Diagnosis not present

## 2020-12-25 DIAGNOSIS — R55 Syncope and collapse: Secondary | ICD-10-CM | POA: Diagnosis not present

## 2020-12-25 DIAGNOSIS — Z79899 Other long term (current) drug therapy: Secondary | ICD-10-CM | POA: Insufficient documentation

## 2020-12-25 DIAGNOSIS — R110 Nausea: Secondary | ICD-10-CM | POA: Diagnosis not present

## 2020-12-25 DIAGNOSIS — Z20822 Contact with and (suspected) exposure to covid-19: Secondary | ICD-10-CM | POA: Diagnosis not present

## 2020-12-25 DIAGNOSIS — Z7901 Long term (current) use of anticoagulants: Secondary | ICD-10-CM | POA: Insufficient documentation

## 2020-12-25 DIAGNOSIS — Z955 Presence of coronary angioplasty implant and graft: Secondary | ICD-10-CM | POA: Diagnosis not present

## 2020-12-25 DIAGNOSIS — Z87891 Personal history of nicotine dependence: Secondary | ICD-10-CM | POA: Insufficient documentation

## 2020-12-25 DIAGNOSIS — R0902 Hypoxemia: Secondary | ICD-10-CM | POA: Diagnosis not present

## 2020-12-25 DIAGNOSIS — I4891 Unspecified atrial fibrillation: Secondary | ICD-10-CM | POA: Insufficient documentation

## 2020-12-25 DIAGNOSIS — I959 Hypotension, unspecified: Secondary | ICD-10-CM | POA: Diagnosis not present

## 2020-12-25 LAB — RESP PANEL BY RT-PCR (FLU A&B, COVID) ARPGX2
Influenza A by PCR: NEGATIVE
Influenza B by PCR: NEGATIVE
SARS Coronavirus 2 by RT PCR: NEGATIVE

## 2020-12-25 LAB — URINALYSIS, ROUTINE W REFLEX MICROSCOPIC
Bilirubin Urine: NEGATIVE
Glucose, UA: NEGATIVE mg/dL
Hgb urine dipstick: NEGATIVE
Ketones, ur: NEGATIVE mg/dL
Leukocytes,Ua: NEGATIVE
Nitrite: NEGATIVE
Protein, ur: NEGATIVE mg/dL
Specific Gravity, Urine: 1.01 (ref 1.005–1.030)
pH: 6.5 (ref 5.0–8.0)

## 2020-12-25 LAB — CBC WITH DIFFERENTIAL/PLATELET
Abs Immature Granulocytes: 0.04 10*3/uL (ref 0.00–0.07)
Basophils Absolute: 0 10*3/uL (ref 0.0–0.1)
Basophils Relative: 0 %
Eosinophils Absolute: 0 10*3/uL (ref 0.0–0.5)
Eosinophils Relative: 0 %
HCT: 35.6 % — ABNORMAL LOW (ref 36.0–46.0)
Hemoglobin: 11.7 g/dL — ABNORMAL LOW (ref 12.0–15.0)
Immature Granulocytes: 1 %
Lymphocytes Relative: 17 %
Lymphs Abs: 1.3 10*3/uL (ref 0.7–4.0)
MCH: 31.8 pg (ref 26.0–34.0)
MCHC: 32.9 g/dL (ref 30.0–36.0)
MCV: 96.7 fL (ref 80.0–100.0)
Monocytes Absolute: 1 10*3/uL (ref 0.1–1.0)
Monocytes Relative: 12 %
Neutro Abs: 5.5 10*3/uL (ref 1.7–7.7)
Neutrophils Relative %: 70 %
Platelets: 213 10*3/uL (ref 150–400)
RBC: 3.68 MIL/uL — ABNORMAL LOW (ref 3.87–5.11)
RDW: 12.5 % (ref 11.5–15.5)
WBC: 7.9 10*3/uL (ref 4.0–10.5)
nRBC: 0 % (ref 0.0–0.2)

## 2020-12-25 LAB — BASIC METABOLIC PANEL
Anion gap: 11 (ref 5–15)
BUN: 31 mg/dL — ABNORMAL HIGH (ref 8–23)
CO2: 25 mmol/L (ref 22–32)
Calcium: 9.1 mg/dL (ref 8.9–10.3)
Chloride: 99 mmol/L (ref 98–111)
Creatinine, Ser: 0.91 mg/dL (ref 0.44–1.00)
GFR, Estimated: >60 mL/min (ref >60)
Glucose, Bld: 115 mg/dL — ABNORMAL HIGH (ref 70–99)
Potassium: 3.4 mmol/L — ABNORMAL LOW (ref 3.5–5.1)
Sodium: 135 mmol/L (ref 135–145)

## 2020-12-25 LAB — CBG MONITORING, ED: Glucose-Capillary: 103 mg/dL — ABNORMAL HIGH (ref 70–99)

## 2020-12-25 MED ORDER — SODIUM CHLORIDE 0.9 % IV BOLUS
1000.0000 mL | Freq: Once | INTRAVENOUS | Status: AC
  Administered 2020-12-25: 1000 mL via INTRAVENOUS

## 2020-12-25 NOTE — ED Provider Notes (Signed)
[facility] DEPT Provider Note   CSN: [id number] Arrival date & time: 12/25/20  1948     History Chief Complaint  Patient presents with   Near Syncope    Cynthia [name] is a 80 y.o. female.  HPI She states that she was at home cooking eggs, when she suddenly felt weak and dizzy so sat down.  She had previously been ill for the last several days with a vomiting illness.  She states she saw her PCP, 2 days ago and was told that she had "the flu."  She denies shortness of breath, cough, focal weakness or paresthesia.  She is not having any diarrhea or changes in her urine habits or urinary tract symptoms.  There are no other known active modifying factors.    Past Medical History:  Diagnosis Date   Acute blood loss anemia 10/27/2017   Acute GI bleeding 10/27/2017   Anemia    Arthritis    "joints" (11/10/2017)   Dizziness    Encounter for loop recorder check 06/09/2020   GERD (gastroesophageal reflux disease)    GI bleed 11/06/2017   Hearing loss    History of blood transfusion 10/2017   Hyperlipidemia    Hyperthyroidism    Loop recorder Biotronik Loop Recorder 05/23/2020 05/23/2020   Melanoma ([place])    "cut off my back"   Memory loss    Sinus headache    Sleep apnea    Syncope and collapse     Patient Active Problem List   Diagnosis Date Noted   NPH (normal pressure hydrocephalus) ([place]) 08/19/2020   Urinary urgency 08/19/2020   Encounter for loop recorder check 06/09/2020   Loop recorder Biotronik Loop Recorder 05/23/2020 05/23/2020   Paroxysmal A-fib ([place]) 03/24/2020   Atrial fibrillation with RVR ([place]) 03/20/2020   Gait disturbance 01/29/2020   Periodic limb movement disorder 08/30/2019   Nocturia 07/01/2018   Midline cystocele 06/08/2018   Recurrent UTI 06/08/2018   Vaginal vault prolapse 06/08/2018   Lightheadedness 06/02/2018   Syncope and collapse    Hypotension 03/17/2018   Postural dizziness with presyncope 03/17/2018   PAF  (paroxysmal atrial fibrillation) ([place]) 03/17/2018   Memory loss 11/25/2017   Acute lower UTI 10/29/2017   Abnormal stress test    Chest pain 11/23/2016   Hyperlipidemia 11/23/2016   GERD (gastroesophageal reflux disease) 11/23/2016   History of recurrent UTIs 11/23/2016   Polyneuropathy 10/30/2016   Bursitis, subacromial 10/30/2016   Numbness 10/30/2016   Arthritis, senescent 01/03/2016   Left knee pain 01/03/2016   Pain in joint, ankle and foot 02/28/2015   Dry mouth 08/07/2014   Dry eyes 08/07/2014   Other headache syndrome 06/25/2014   Sinusitis, chronic 06/25/2014   Neck pain 06/25/2014   OSA on CPAP 06/25/2014   Essential hypertension, benign 06/25/2014    Past Surgical History:  Procedure Laterality Date   ABDOMINAL HYSTERECTOMY     "partial"   APPENDECTOMY     BIOPSY  11/17/2017   Procedure: BIOPSY;  Surgeon: Clarene Essex, MD;  Location: [facility];  Service: Endoscopy;;   COLONOSCOPY WITH PROPOFOL N/A 11/17/2017   Procedure: COLONOSCOPY WITH PROPOFOL;  Surgeon: Clarene Essex, MD;  Location: [facility];  Service: Endoscopy;  Laterality: N/A;   CRANIECTOMY FOR EXCISION OF ACOUSTIC NEUROMA     ESOPHAGOGASTRODUODENOSCOPY (EGD) WITH PROPOFOL N/A 10/28/2017   Procedure: ESOPHAGOGASTRODUODENOSCOPY (EGD) WITH PROPOFOL;  Surgeon: Clarene Essex, MD;  Location: [facility];  Service: Endoscopy;  Laterality: N/A;   FOOT SURGERY  Bilateral 04/18/2020   KNEE ARTHROSCOPY Left    LEFT HEART CATH AND CORONARY ANGIOGRAPHY N/A 11/24/2016   Procedure: LEFT HEART CATH AND CORONARY ANGIOGRAPHY;  Surgeon: Jettie Booze, MD;  Location: [facility];  Service: Cardiovascular;  Laterality: N/A;   MELANOMA EXCISION     "off my back"   NASAL SINUS SURGERY     TONSILLECTOMY       OB History   No obstetric history on file.     Family History  Problem Relation Age of Onset   Congestive Heart Failure Mother    Heart disease Father        History of heart attacks at a later age.      Heart disease Brother    Hearing loss Brother     Social History   Tobacco Use   Smoking status: Former    Years: 2.00    Types: Cigarettes    Quit date: 1980    Years since quitting: 42.7   Smokeless tobacco: Never   Tobacco comments:    11/10/2017 "only smoked when I was on my period"  Vaping Use   Vaping Use: Never used  Substance Use Topics   Alcohol use: Yes    Comment: 11/10/2017 "nothing lately; did have 1 glass of wine q night"   Drug use: Never    Home Medications Prior to Admission medications   Medication Sig Start Date End Date Taking? Authorizing Provider  acetaminophen (TYLENOL) 500 MG tablet Take 500 mg by mouth daily.   Yes [provider]  apixaban (ELIQUIS) 5 MG TABS tablet Take 1 tablet (5 mg total) by mouth 2 (two) times daily. With breakfast and dinner 05/30/20  Yes Cantwell, Celeste C, PA-C  atorvastatin (LIPITOR) 10 MG tablet Take 1 tablet (10 mg total) by mouth daily. At night 05/30/20  Yes Cantwell, Celeste C, PA-C  Calcium Carbonate-Vitamin D3 600-400 MG-UNIT TABS Take 1 tablet by mouth daily.   Yes [provider]  diltiazem (CARDIZEM CD) 120 MG 24 hr capsule TAKE 1 CAPSULE BY MOUTH EVERY DAY 06/25/20  Yes Cantwell, Celeste C, PA-C  fluticasone (FLONASE) 50 MCG/ACT nasal spray Place 1 spray into the nose daily as needed for allergies.    Yes [provider]  GLUCOSAMINE-CHONDROITIN PO Take 1 tablet by mouth daily at 12 noon.   Yes [provider]  losartan (COZAAR) 100 MG tablet Take 100 mg by mouth every evening. 04/15/20  Yes [provider]  meclizine (ANTIVERT) 25 MG tablet Take 25 mg by mouth daily as needed for dizziness.   Yes [provider]  Melatonin 5 MG CAPS Take 5 mg by mouth at bedtime.    Yes [provider]  metoprolol succinate (TOPROL-XL) 25 MG 24 hr tablet TAKE 1 TABLET BY MOUTH DAILY. TAKE WITH OR IMMEDIATELY FOLLOWING A MEAL. 09/11/20  Yes Cantwell, Celeste C, PA-C  Multiple  Vitamin (MULTIVITAMIN WITH MINERALS) TABS tablet Take 1 tablet by mouth daily.   Yes [provider]  nitroGLYCERIN (NITROLINGUAL) 0.4 MG/SPRAY spray PLACE 1 SPRAY UNDER THE TONGUE EVERY 5 (FIVE) MINUTES FOR 3 DOSES AS NEEDED FOR CHEST PAIN Patient taking differently: Place 1 spray under the tongue every 5 (five) minutes x 3 doses as needed for chest pain. 03/27/20  Yes Adrian Prows, MD  pantoprazole (PROTONIX) 40 MG tablet Take 40 mg by mouth daily before breakfast.  06/12/14  Yes [provider]  thyroid (ARMOUR) 60 MG tablet Take 60 mg by  mouth daily before breakfast. 11/07/17  Yes [provider]  traMADol (ULTRAM) 50 MG tablet Take 1 tablet by mouth as needed. 06/26/20  Yes [provider]  vitamin C (ASCORBIC ACID) 500 MG tablet Take 500 mg by mouth daily.    Yes [provider]  Alpha-Lipoic Acid (LIPOIC ACID PO) Take 600 mg by mouth daily.    [provider]  cycloSPORINE (RESTASIS) 0.05 % ophthalmic emulsion Place 1 drop into both eyes 2 (two) times daily.  Patient not taking: No sig reported    [provider]  desmopressin (DDAVP) 0.2 MG tablet TAKE 1 TABLET BY MOUTH NIGHTLY. SLOWLY INCREASE AS NEEDED TO 3 TABLETS NIGHTLY Patient not taking: No sig reported 06/25/20   [provider]  DULoxetine (CYMBALTA) 30 MG capsule TAKE 1 CAPSULE BY MOUTH TWICE A DAY Patient not taking: No sig reported 12/12/19   Sater, Nanine Means, MD  hydrochlorothiazide (HYDRODIURIL) 25 MG tablet Take 25 mg by mouth in the morning. 04/14/20   [provider]  nitrofurantoin (MACRODANTIN) 100 MG capsule Take 100 mg by mouth daily. 02/08/20   [provider]  Omega-3 1000 MG CAPS Take 2,000 mg by mouth daily.     [provider]  tiZANidine (ZANAFLEX) 4 MG tablet Take 1 tablet (4 mg total) by mouth 3 (three) times daily as needed. Patient not taking: Reported on 12/25/2020 02/09/19   [name], MD    Allergies     Flecainide, Avelox [moxifloxacin hcl in nacl], Other, Oxycodone, and Alendronate sodium  Review of Systems   Review of Systems  All other systems reviewed and are negative.  Physical Exam Updated Vital Signs BP 123/69   Pulse 80   Temp 98.1 F (36.7 C) (Oral)   Resp 16   SpO2 95%   Physical Exam Vitals and nursing note reviewed.  Constitutional:      General: She is not in acute distress.    Appearance: She is well-developed. She is not ill-appearing or toxic-appearing.  HENT:     Head: Normocephalic and atraumatic.     Right Ear: External ear normal.     Left Ear: External ear normal.  Eyes:     Conjunctiva/sclera: Conjunctivae normal.     Pupils: Pupils are equal, round, and reactive to light.  Neck:     Trachea: Phonation normal.  Cardiovascular:     Rate and Rhythm: Normal rate and regular rhythm.     Heart sounds: Normal heart sounds.  Pulmonary:     Effort: Pulmonary effort is normal. No respiratory distress.     Breath sounds: Normal breath sounds. No stridor.  Abdominal:     General: There is no distension.     Palpations: Abdomen is soft.     Tenderness: There is no abdominal tenderness.  Musculoskeletal:        General: No swelling or tenderness. Normal range of motion.     Cervical back: Normal range of motion and neck supple.  Skin:    General: Skin is warm and dry.  Neurological:     Mental Status: She is alert and oriented to person, place, and time.     Cranial Nerves: No cranial nerve deficit.     Sensory: No sensory deficit.     Motor: No abnormal muscle tone.     Coordination: Coordination normal.  Psychiatric:        Behavior: Behavior normal.        Thought Content: Thought content normal.  Judgment: Judgment normal.    ED Results / Procedures / Treatments   Labs (all labs ordered are listed, but only abnormal results are displayed) Labs Reviewed  BASIC METABOLIC PANEL - Abnormal; Notable for the following components:       Result Value   Potassium 3.4 (*)    Glucose, Bld 115 (*)    BUN 31 (*)    All other components within normal limits  CBC WITH DIFFERENTIAL/PLATELET - Abnormal; Notable for the following components:   RBC 3.68 (*)    Hemoglobin 11.7 (*)    HCT 35.6 (*)    All other components within normal limits  CBG MONITORING, ED - Abnormal; Notable for the following components:   Glucose-Capillary 103 (*)    All other components within normal limits  RESP PANEL BY RT-PCR (FLU A&B, COVID) ARPGX2  URINALYSIS, ROUTINE W REFLEX MICROSCOPIC    EKG None  Radiology No results found.  Procedures Procedures   Medications Ordered in ED Medications  sodium chloride 0.9 % bolus 1,000 mL (0 mLs Intravenous Stopped 12/25/20 2311)    ED Course  I have reviewed the triage vital signs and the nursing notes.  Pertinent labs & imaging results that were available during my care of the patient were reviewed by me and considered in my medical decision making (see chart for details).    MDM Rules/Calculators/A&P                            Patient Vitals for the past 24 hrs:  BP Temp Temp src Pulse Resp SpO2  12/25/20 2330 -- 98.1 F (36.7 C) Oral -- -- --  12/25/20 2300 123/69 -- -- 80 16 95 %  12/25/20 2200 (!) 144/70 -- -- 79 19 96 %  12/25/20 2145 137/68 -- -- 78 18 99 %  12/25/20 2130 136/75 -- -- 67 19 100 %  12/25/20 2100 (!) 134/55 -- -- 69 (!) 21 96 %  12/25/20 2000 106/65 -- -- 67 -- 98 %  12/25/20 1958 114/60 98.1 F (36.7 C) Oral 65 20 95 %    11:32 PM Reevaluation with update and discussion. After initial assessment and treatment, an updated evaluation reveals she feels better after IV fluids.  No further complaints.  Findings discussed and questions answered.  Husband at bedside during this discussion. Daleen Bo   Medical Decision Making:  This patient is presenting for evaluation of near syncope, which does require a range of treatment options, and is a complaint that involves  a moderate risk of morbidity and mortality. The differential diagnoses include acute illness, metabolic disorder, cardiac arrhythmia. I decided to review old records, and in summary elderly female presenting with near syncopal episode.  She has a history of UTI, postural dizziness, paroxysmal atrial fibrillation, syncope.  I obtained additional historical information from husband at bedside.  Clinical Laboratory Tests Ordered, included CBC, Metabolic panel, Urinalysis, and viral panel . Review indicates normal findings except hemoglobin low, potassium slightly low, glucose high, BUN high.   Critical Interventions-clinical evaluation, laboratory testing, IV fluids, observation reassessment  After These Interventions, the Patient was reevaluated and was found she felt better after IV fluids and does not require further evaluation.  She is stable for discharge.  CRITICAL CARE-no Performed by: Daleen Bo  Nursing Notes Reviewed/ Care Coordinated Applicable Imaging Reviewed Interpretation of Laboratory Data incorporated into ED treatment  The patient appears reasonably screened and/or stabilized for discharge and  I doubt any other medical condition or other [facility] requiring further screening, evaluation, or treatment in the ED at this time prior to discharge.  Plan: Home Medications-continue usual; Home Treatments-rest, fluids; return here if the recommended treatment, does not improve the symptoms; Recommended follow up-PCP, as needed     Final Clinical Impression(s) / ED Diagnoses Final diagnoses:  Near syncope    Rx / DC Orders ED Discharge Orders     None        Daleen Bo, MD 12/25/20 2335

## 2020-12-25 NOTE — ED Triage Notes (Signed)
Patient presents from home due to a near syncopal episode. She reports feeling lightheaded after standing up in her kitchen. She has had flu like symptoms since last weak with nausea and vomiting the past few days.   EMS vitals: 100/60 BP prior to Bolus-> 118/76 BP post 500 cc fluid bolus 65 HR 85-95% RA 155 CBG

## 2020-12-25 NOTE — Discharge Instructions (Addendum)
The testing today did not show any problems to explain your dizziness.  Make sure you are getting plenty of rest and drinking a lot of fluids.  Call your primary care doctor for a follow-up appointment, next week.

## 2020-12-26 ENCOUNTER — Ambulatory Visit: Payer: Medicare PPO | Admitting: Student

## 2020-12-26 ENCOUNTER — Encounter: Payer: Self-pay | Admitting: Student

## 2020-12-26 VITALS — BP 105/51 | HR 66 | Temp 97.9°F | Ht 65.0 in | Wt 147.6 lb

## 2020-12-26 DIAGNOSIS — I951 Orthostatic hypotension: Secondary | ICD-10-CM

## 2020-12-26 DIAGNOSIS — R55 Syncope and collapse: Secondary | ICD-10-CM

## 2020-12-26 DIAGNOSIS — I480 Paroxysmal atrial fibrillation: Secondary | ICD-10-CM

## 2020-12-26 NOTE — Progress Notes (Signed)
Primary Physician/Referring:  Deland Pretty, MD  [name], female    DOB: [birth year], 80 y.o.   MRN: [id number]  Chief Complaint  Patient presents with   Hypertension   Follow-up   Results   Hospitalization Follow-up   HPI:    Cynthia [name]  is a 80 y.o. Caucasian female with hypertension, hyperlipidemia, frequent UTI,  paroxysmal atrial fibrillation, atrial tachycardia, obstructive sleep apnea on CPAP. Tolerating anticoagulation well. Flecainide discontinued due to dizziness. In the past she has had recurrent episodes of near syncope and severe orthostatic changes with recurrent UTI.  However patient with episodes of syncope and near syncope not associated with UTI.  Patient underwent surgery to her right foot 03/04/2020 and had an episode of syncope while at rehabilitation facility on 04/10/2020.   Patient presents for 78-monthfollow-up of atrial fibrillation and results of cardiac testing.  Last office visit ordered nuclear stress test and echocardiogram.  Office visit she was evaluated yesterday in the emergency department following a syncopal episode.  Patient states she was standing in the kitchen and became lightheaded she then had witnessed syncopal episode, husband reports patient lost consciousness for approximately 1 minute.  In the ED patient's blood pressure was soft upon presentation.  She had been diagnosed with the flu a few days prior to this and has been vomiting consistently.  Patient was given IV fluids in the ED and responded well.  Patient has otherwise been feeling well since last office visit.  Denies chest pain, palpitations, dyspnea.  Past Medical History:  Diagnosis Date   Acute blood loss anemia 10/27/2017   Acute GI bleeding 10/27/2017   Anemia    Arthritis    "joints" (11/10/2017)   Dizziness    Encounter for loop recorder check 06/09/2020   GERD (gastroesophageal reflux disease)    GI bleed 11/06/2017   Hearing loss    History of blood  transfusion 10/2017   Hyperlipidemia    Hyperthyroidism    Loop recorder [employer] Loop Recorder 05/23/2020 05/23/2020   [place]    "cut [place] my [place]"   Memory loss    Sinus headache    Sleep apnea    Syncope and collapse    Past Surgical History:  Procedure Laterality Date   ABDOMINAL HYSTERECTOMY     "partial"   APPENDECTOMY     BIOPSY  11/17/2017   Procedure: BIOPSY;  Surgeon: MClarene Essex MD;  Location: [facility];  Service: Endoscopy;;   COLONOSCOPY WITH PROPOFOL N/A 11/17/2017   Procedure: COLONOSCOPY WITH PROPOFOL;  Surgeon: MClarene Essex MD;  Location: [facility];  Service: Endoscopy;  Laterality: N/A;   CRANIECTOMY FOR EXCISION OF ACOUSTIC NEUROMA     ESOPHAGOGASTRODUODENOSCOPY (EGD) WITH PROPOFOL N/A 10/28/2017   Procedure: ESOPHAGOGASTRODUODENOSCOPY (EGD) WITH PROPOFOL;  Surgeon: MClarene Essex MD;  Location: [facility];  Service: Endoscopy;  Laterality: N/A;   FOOT SURGERY Bilateral 04/18/2020   KNEE ARTHROSCOPY Left    LEFT HEART CATH AND CORONARY ANGIOGRAPHY N/A 11/24/2016   Procedure: LEFT HEART CATH AND CORONARY ANGIOGRAPHY;  Surgeon: VJettie Booze MD;  [facility];  Service: Cardiovascular;  Laterality: N/A;   MELANOMA EXCISION     "off my back"   NASAL SINUS SURGERY     TONSILLECTOMY     Family History  Problem Relation Age of Onset   Congestive Heart Failure Mother    Heart disease Father        History of heart attacks at  a later age.     Heart disease Brother    Hearing loss Brother     Social History   Tobacco Use   Smoking status: Former    Years: 2.00    Types: Cigarettes    Quit date: 1980    Years since quitting: 42.7   Smokeless tobacco: Never   Tobacco comments:    11/10/2017 "only smoked when I was on my period"  Substance Use Topics   Alcohol use: Yes    Comment: 11/10/2017 "nothing lately; did have 1 glass of wine q night"  Marital Status: Married   ROS  Review of Systems  Constitutional: Negative for  malaise/fatigue and weight gain.  Cardiovascular:  Positive for syncope. Negative for chest pain, claudication, dyspnea on exertion, leg swelling, near-syncope, orthopnea, palpitations and paroxysmal nocturnal dyspnea.  Respiratory:  Negative for shortness of breath.   Hematologic/Lymphatic: Does not bruise/bleed easily.  Gastrointestinal:  Negative for melena.  Neurological:  Negative for dizziness, light-headedness and weakness.  Objective  Blood pressure (!) 105/51, pulse 66, temperature 97.9 F (36.6 C), temperature source Temporal, height 5' 5" (1.651 m), weight 147 lb 9.6 oz (67 kg), SpO2 95 %.  Vitals with BMI 12/26/2020 12/25/2020 12/25/2020  Height 5' 5" - -  Weight 147 lbs 10 oz - -  BMI 97.74 - -  Systolic 142 395 320  Diastolic 51 69 70  Pulse 66 80 79    Physical Exam Vitals reviewed.  Constitutional:      General: She is not in acute distress.    Comments: Patient presents in wheelchair, walks independently at home.  Cardiovascular:     Rate and Rhythm: Normal rate and regular rhythm.     Pulses: Intact distal pulses.     Heart sounds: Normal heart sounds, S1 normal and S2 normal. No murmur heard.   No gallop.     Comments: No leg edema, no JVD. Pulmonary:     Effort: Pulmonary effort is normal. No respiratory distress.     Breath sounds: Normal breath sounds. No wheezing, rhonchi or rales.  Musculoskeletal:     Right lower leg: No edema.     Left lower leg: No edema.  Skin:    General: Skin is warm and dry.  Neurological:     Mental Status: She is alert.   Laboratory examination:   Recent Labs    03/26/20 0659 04/10/20 1034 12/25/20 2112  NA 132* 144 135  K 3.4* 3.2* 3.4*  CL 97* 107 99  CO2 _0 GLUCOSE 114* 105* 115*  BUN 11 19 31*  CREATININE 0.57 0.66 0.91  CALCIUM 9.9 9.3 9.1  GFRNONAA >60 >60 >60   estimated creatinine clearance is 45.8 mL/min (by C-G formula based on SCr of 0.91 mg/dL).  CMP Latest Ref Rng & Units 12/25/2020 04/10/2020  03/26/2020  Glucose 70 - 99 mg/dL 115(H) 105(H) 114(H)  BUN 8 - 23 mg/dL 31(H) 19 11  Creatinine 0.44 - 1.00 mg/dL 0.91 0.66 0.57  Sodium 135 - 145 mmol/L 135 144 132(L)  Potassium 3.5 - 5.1 mmol/L 3.4(L) 3.2(L) 3.4(L)  Chloride 98 - 111 mmol/L 99 107 97(L)  CO2 22 - 32 mmol/L _1 Calcium 8.9 - 10.3 mg/dL 9.1 9.3 9.9  Total Protein 6.5 - 8.1 g/dL - 6.1(L) -  Total Bilirubin 0.3 - 1.2 mg/dL - 0.8 -  Alkaline Phos 38 - 126 U/L - 47 -  AST 15 - 41 U/L - 20 -  ALT 0 - 44 U/L - 19 -   CBC Latest Ref Rng & Units 12/25/2020 04/10/2020 03/25/2020  WBC 4.0 - 10.5 K/uL 7.9 6.2 8.2  Hemoglobin 12.0 - 15.0 g/dL 11.7(L) 11.8(L) 12.6  Hematocrit 36.0 - 46.0 % 35.6(L) 35.7(L) 34.8(L)  Platelets 150 - 400 K/uL 213 280 245   Lipid Panel     Component Value Date/Time   CHOL 165 11/24/2016 0040   TRIG 88 11/24/2016 0040   HDL 61 11/24/2016 0040   CHOLHDL 2.7 11/24/2016 0040   VLDL 18 11/24/2016 0040   LDLCALC 86 11/24/2016 0040   HEMOGLOBIN A1C Lab Results  Component Value Date   HGBA1C 5.6 11/24/2016   MPG 114 11/24/2016   TSH Recent Labs    03/21/20 1219  TSH 0.974    External labs:  06/13/2020: Glucose 89, sodium 141, potassium 3.8, BUN 34, creatinine 0.8, GFR >60, AST 16, ALT 23, alk phos 40  03/11/2020: Hemoglobin 10.9, hematocrit 34.2, platelet 241, MCV 94.7 Total cholesterol 165, triglycerides 56, HDL 65, LDL 89 TSH 0.24  Cholesterol, total 145.000 m 09/01/2019 HDL 67.000 mg 10/13/2019 LDL-C 94.000 cal 10/13/2019 Triglycerides 80.000 mg 10/13/2019  Hemoglobin 10.900 g/d 01/24/2020 INR 1.160 11/06/2017 Platelets 197.000 K/ 01/24/2020  Creatinine, Serum 0.580 mg/ 01/24/2020 Potassium 3.500 mm 01/24/2020 ALT (SGPT) 15.000 IU/ 09/01/2019  TSH 0.870 10/13/2019  Hemoglobin 9.300 12/14/2018;  INR 1.160 11/06/2017 Platelets 188.000 12/14/2018  Creatinine, Serum 0.970 12/14/2018 Potassium 4.000 12/14/2018 Magnesium N/D ALT (SGPT) 15.000 12/14/2018  Labs 11/02/2018: BUN 26,  creatinine 0.9, EGFR greater than 60 mL, Hb 10.6/HCT 32.2, platelets 244.   Allergies   Allergies  Allergen Reactions   Flecainide Other (See Comments)    Severe dizziness   Avelox [Moxifloxacin Hcl In Nacl] Other (See Comments)    Unknown reaction   Ciprofloxacin     Other reaction(s): multiple medication interactions   Other Other (See Comments)    Avelox+cymbalta Together Other reaction(s): flu like symptoms   Oxycodone Other (See Comments)    uphoria   Trimethoprim Other (See Comments)    Other reaction(s): hives   Alendronate Sodium Nausea Only      Medications Prior to Visit:   Outpatient Medications Prior to Visit  Medication Sig Dispense Refill   acetaminophen (TYLENOL) 500 MG tablet Take 500 mg by mouth daily.     Alpha-Lipoic Acid (LIPOIC ACID PO) Take 600 mg by mouth daily.     apixaban (ELIQUIS) 5 MG TABS tablet Take 1 tablet (5 mg total) by mouth 2 (two) times daily. With breakfast and dinner 60 tablet 5   atorvastatin (LIPITOR) 10 MG tablet Take 1 tablet (10 mg total) by mouth daily. At night 30 tablet 11   Calcium Carbonate-Vitamin D3 600-400 MG-UNIT TABS Take 1 tablet by mouth daily.     cycloSPORINE (RESTASIS) 0.05 % ophthalmic emulsion Place 1 drop into both eyes 2 (two) times daily.     desmopressin (DDAVP) 0.2 MG tablet TAKE 1 TABLET BY MOUTH NIGHTLY. SLOWLY INCREASE AS NEEDED TO 3 TABLETS NIGHTLY     diltiazem (CARDIZEM CD) 120 MG 24 hr capsule TAKE 1 CAPSULE BY MOUTH EVERY DAY 90 capsule 1   DULoxetine (CYMBALTA) 30 MG capsule TAKE 1 CAPSULE BY MOUTH TWICE A DAY 180 capsule 3   fluticasone (FLONASE) 50 MCG/ACT nasal spray Place 1 spray into the nose daily as needed for allergies.      GLUCOSAMINE-CHONDROITIN PO Take 1 tablet by mouth daily at 12 noon.  hydrochlorothiazide (HYDRODIURIL) 25 MG tablet Take 25 mg by mouth in the morning.     losartan (COZAAR) 100 MG tablet Take 100 mg by mouth every evening.     meclizine (ANTIVERT) 25 MG tablet Take 25 mg  by mouth daily as needed for dizziness.     Melatonin 5 MG CAPS Take 5 mg by mouth at bedtime.      metoprolol succinate (TOPROL-XL) 25 MG 24 hr tablet TAKE 1 TABLET BY MOUTH DAILY. TAKE WITH OR IMMEDIATELY FOLLOWING A MEAL. 90 tablet 1   Multiple Vitamin (MULTIVITAMIN WITH MINERALS) TABS tablet Take 1 tablet by mouth daily.     nitrofurantoin (MACRODANTIN) 100 MG capsule Take 100 mg by mouth daily.     nitroGLYCERIN (NITROLINGUAL) 0.4 MG/SPRAY spray PLACE 1 SPRAY UNDER THE TONGUE EVERY 5 (FIVE) MINUTES FOR 3 DOSES AS NEEDED FOR CHEST PAIN (Patient taking differently: Place 1 spray under the tongue every 5 (five) minutes x 3 doses as needed for chest pain.) 12 g 1   Omega-3 1000 MG CAPS Take 2,000 mg by mouth daily.      pantoprazole (PROTONIX) 40 MG tablet Take 40 mg by mouth daily before breakfast.   1   thyroid (ARMOUR) 60 MG tablet Take 60 mg by mouth daily before breakfast.     tiZANidine (ZANAFLEX) 4 MG tablet Take 1 tablet (4 mg total) by mouth 3 (three) times daily as needed. 90 tablet 5   traMADol (ULTRAM) 50 MG tablet Take 1 tablet by mouth as needed.     vitamin C (ASCORBIC ACID) 500 MG tablet Take 500 mg by mouth daily.      No facility-administered medications prior to visit.     Final Medications at End of Visit    Current Meds  Medication Sig   acetaminophen (TYLENOL) 500 MG tablet Take 500 mg by mouth daily.   Alpha-Lipoic Acid (LIPOIC ACID PO) Take 600 mg by mouth daily.   apixaban (ELIQUIS) 5 MG TABS tablet Take 1 tablet (5 mg total) by mouth 2 (two) times daily. With breakfast and dinner   atorvastatin (LIPITOR) 10 MG tablet Take 1 tablet (10 mg total) by mouth daily. At night   Calcium Carbonate-Vitamin D3 600-400 MG-UNIT TABS Take 1 tablet by mouth daily.   cycloSPORINE (RESTASIS) 0.05 % ophthalmic emulsion Place 1 drop into both eyes 2 (two) times daily.   desmopressin (DDAVP) 0.2 MG tablet TAKE 1 TABLET BY MOUTH NIGHTLY. SLOWLY INCREASE AS NEEDED TO 3 TABLETS NIGHTLY    diltiazem (CARDIZEM CD) 120 MG 24 hr capsule TAKE 1 CAPSULE BY MOUTH EVERY DAY   DULoxetine (CYMBALTA) 30 MG capsule TAKE 1 CAPSULE BY MOUTH TWICE A DAY   fluticasone (FLONASE) 50 MCG/ACT nasal spray Place 1 spray into the nose daily as needed for allergies.    GLUCOSAMINE-CHONDROITIN PO Take 1 tablet by mouth daily at 12 noon.   hydrochlorothiazide (HYDRODIURIL) 25 MG tablet Take 25 mg by mouth in the morning.   losartan (COZAAR) 100 MG tablet Take 100 mg by mouth every evening.   meclizine (ANTIVERT) 25 MG tablet Take 25 mg by mouth daily as needed for dizziness.   Melatonin 5 MG CAPS Take 5 mg by mouth at bedtime.    metoprolol succinate (TOPROL-XL) 25 MG 24 hr tablet TAKE 1 TABLET BY MOUTH DAILY. TAKE WITH OR IMMEDIATELY FOLLOWING A MEAL.   Multiple Vitamin (MULTIVITAMIN WITH MINERALS) TABS tablet Take 1 tablet by mouth daily.   nitrofurantoin (MACRODANTIN) 100 MG capsule  Take 100 mg by mouth daily.   nitroGLYCERIN (NITROLINGUAL) 0.4 MG/SPRAY spray PLACE 1 SPRAY UNDER THE TONGUE EVERY 5 (FIVE) MINUTES FOR 3 DOSES AS NEEDED FOR CHEST PAIN (Patient taking differently: Place 1 spray under the tongue every 5 (five) minutes x 3 doses as needed for chest pain.)   Omega-3 1000 MG CAPS Take 2,000 mg by mouth daily.    pantoprazole (PROTONIX) 40 MG tablet Take 40 mg by mouth daily before breakfast.    thyroid (ARMOUR) 60 MG tablet Take 60 mg by mouth daily before breakfast.   tiZANidine (ZANAFLEX) 4 MG tablet Take 1 tablet (4 mg total) by mouth 3 (three) times daily as needed.   traMADol (ULTRAM) 50 MG tablet Take 1 tablet by mouth as needed.   vitamin C (ASCORBIC ACID) 500 MG tablet Take 500 mg by mouth daily.       Radiology:   No results found.  Cardiac Studies:   Event Monitor for 30 days Start date 07/01/2018 for syncope : A. Fib one episode, no symptoms reported. No heart block or significant bradycardia  Carotid duplex 03/27/2018:  Right Carotid: Velocities in the right ICA are  consistent with a 1-39% stenosis. Left Carotid: Velocities in the left ICA are consistent with a 1-39% stenosis. Vertebrals:  Bilateral vertebral arteries demonstrate antegrade flow. Subclavians: Normal flow hemodynamics were seen in bilateral subclavian arteries.   Event Monitor 30 days 11/11/2017: Predominant rhythm is normal sinus rhythm. Symptoms of fatigue reveals normal sinus rhythm. Asymptomatic atrial fibrillation and probable atypical atrial flutter noted on 11/13/2017, 11/18/2017 and 12/09/2017 at 3:00 AM. Occasional PACs. Arrhythmia/PVC burden 6%.   Echo 11/09/2017: Left ventricle: The cavity size was normal. Systolic function was   normal. The estimated ejection fraction was in the range of 60% to 65%. Wall motion was normal; there were no regional wall   motion abnormalities. The study is not technically sufficient to   allow evaluation of LV diastolic function.Pulmonary arteries: PA peak pressure: 32 mm Hg (S).   Coronary angiogram 11/24/2016:  Normal coronaries; tortuous LAD   Loop Recorder   Loop recorder implantation 05/23/2020  Scheduled Remote loop recorder check 06/04/2020: Unremarkable transmission. Occasional PVC. No heart block, no atrial fibrillation. No symptoms reported  Remote loop transmission 11/10/2020: Predominant rhythm = normal sinus rhythm. There were 2 high ventricular rate episodes since 05/24/2020, EGM = A. fib with RVR. Longest 20 minutes. AF burden <0.1%. There were no significant bradycardia or ventricular pauses. Triggered recordings: Feature turned off.   EKG:   EKG 08/26/2020: Sinus rhythm at a rate of 72 bpm.  Left atrial enlargement.  Left axis, left anterior fascicular block.  Incomplete right bundle branch block.  EKG 05/17/2020: Sinus rhythm at a rate of 60 bpm, left atrial enlargement.  Left axis, left anterior fascicular block.  Nonspecific T wave abnormality.  Compared to EKG 04/19/2020, incomplete right bundle branch block and poor progression  not present.  EKG 04/19/2020: Sinus rhythm at a rate of 79 bpm, left atrial enlargement.  Left axis, left anterior fascicular block.  Incomplete right bundle branch block.  Poor R wave progression, cannot exclude anteroseptal infarct old.  Nonspecific T wave abnormality.  Compared to EKG 02/02/2020, no significant change.  EKG 02/02/2020: Sinus tachycardia at the rate of 103 bpm, left axis deviation, left anterior fascicular block.  Incomplete right bundle branch block.  Poor R wave progression, cannot exclude anteroseptal infarct old.  Nonspecific T abnormality.  No significant change from 06/12/2019.  EKG 06/20/2018:  Atrial tachycardia at the rate of 133 bpm.  Left axis deviation, left anterior fascicular block.  Poor R-wave progression.  Incomplete right bundle branch block.  Assessment     ICD-10-CM   1. Syncope and collapse  R55     2. Orthostatic hypotension  I95.1     3. Paroxysmal atrial fibrillation (HCC)  I48.0        No orders of the defined types were placed in this encounter.   There are no discontinued medications.   This patients CHA2DS2-VASc Score 4 (HTN, F, Age)and yearly risk of stroke 4.8%.   Recommendations:   Cynthia [name]  is a 80 y.o. Caucasian female with hypertension, hyperlipidemia, frequent UTI,  paroxysmal atrial fibrillation, atrial tachycardia, obstructive sleep apnea on CPAP. Tolerating anticoagulation well. Flecainide discontinued due to dizziness. In the past she has had recurrent episodes of near syncope and severe orthostatic changes with recurrent UTI.  However patient with episodes of syncope and near syncope not associated with UTI.  Patient underwent surgery to her right foot 03/04/2020 and had an episode of syncope while at rehabilitation facility on 04/10/2020.   Patient presents for 44-monthfollow-up.  Suspect syncopal episode yesterday to be related to dehydration secondary to vomiting. Patient blood pressure remains soft Have advised her to hold  Hydrochlorothiazide today and tomorrow resume at reduced dose.  Advised patient to take hydrochlorothiazide 12.5 mg once daily, instead of 25 mg.  Patient's blood pressure remains soft we will discontinue hydrochlorothiazide altogether.  Reviewed and discussed with patient results of stress test and echocardiogram, details above.  Echocardiogram without significant abnormalities and normal LVEF, stress test was low risk.  We will hold off on initiation of antiarrhythmic therapy at this time.    Follow-up in 4 weeks, sooner if needed, blood pressure and atrial fibrillation.   CAlethia Berthold PA-C 12/26/2020, 4:01 PM Office: [phone]

## 2020-12-27 ENCOUNTER — Other Ambulatory Visit: Payer: Medicare PPO

## 2020-12-27 ENCOUNTER — Other Ambulatory Visit: Payer: Self-pay

## 2020-12-27 DIAGNOSIS — Z9181 History of falling: Secondary | ICD-10-CM | POA: Diagnosis not present

## 2020-12-27 DIAGNOSIS — R2681 Unsteadiness on feet: Secondary | ICD-10-CM | POA: Diagnosis not present

## 2020-12-27 DIAGNOSIS — R2689 Other abnormalities of gait and mobility: Secondary | ICD-10-CM | POA: Diagnosis not present

## 2021-01-03 DIAGNOSIS — R2689 Other abnormalities of gait and mobility: Secondary | ICD-10-CM | POA: Diagnosis not present

## 2021-01-03 DIAGNOSIS — M545 Low back pain, unspecified: Secondary | ICD-10-CM | POA: Diagnosis not present

## 2021-01-03 DIAGNOSIS — M25551 Pain in right hip: Secondary | ICD-10-CM | POA: Diagnosis not present

## 2021-01-07 DIAGNOSIS — R110 Nausea: Secondary | ICD-10-CM | POA: Diagnosis not present

## 2021-01-07 DIAGNOSIS — M545 Low back pain, unspecified: Secondary | ICD-10-CM | POA: Diagnosis not present

## 2021-01-07 DIAGNOSIS — R2689 Other abnormalities of gait and mobility: Secondary | ICD-10-CM | POA: Diagnosis not present

## 2021-01-07 DIAGNOSIS — M25551 Pain in right hip: Secondary | ICD-10-CM | POA: Diagnosis not present

## 2021-01-10 DIAGNOSIS — R55 Syncope and collapse: Secondary | ICD-10-CM | POA: Diagnosis not present

## 2021-01-10 DIAGNOSIS — Z95818 Presence of other cardiac implants and grafts: Secondary | ICD-10-CM | POA: Diagnosis not present

## 2021-01-10 DIAGNOSIS — Z4509 Encounter for adjustment and management of other cardiac device: Secondary | ICD-10-CM | POA: Diagnosis not present

## 2021-01-20 DIAGNOSIS — M25551 Pain in right hip: Secondary | ICD-10-CM | POA: Diagnosis not present

## 2021-01-20 DIAGNOSIS — M7061 Trochanteric bursitis, right hip: Secondary | ICD-10-CM | POA: Diagnosis not present

## 2021-01-22 NOTE — Progress Notes (Deleted)
   I, Wendy Poet, LAT, ATC, am serving as scribe for Dr. Lynne Leader.  Cynthia [name] is a 80 y.o. female who presents to [place] [facility] today for f/u of R foot/medial arch pain due to a pressure wound.  She was last seen by Dr. Georgina Snell on 10/23/20 and noted some limited improvement.  She was continuing to use MT pads in her shoes.  Today, pt reports   Diagnostic imaging: R foot XR- 06/04/20, 03/29/20  Pertinent review of systems: ***  Relevant historical information: ***   Exam:  There were no vitals taken for this visit. General: Well Developed, well nourished, and in no acute distress.   MSK: ***    Lab and Radiology Results No results found for this or any previous visit (from the past 72 hour(s)). No results found.     Assessment and Plan: 80 y.o. female with ***   PDMP not reviewed this encounter. No orders of the defined types were placed in this encounter.  No orders of the defined types were placed in this encounter.    Discussed warning signs or symptoms. Please see discharge instructions. Patient expresses understanding.   ***

## 2021-01-23 ENCOUNTER — Ambulatory Visit: Payer: Medicare PPO | Admitting: Family Medicine

## 2021-02-03 DIAGNOSIS — Z742 Need for assistance at home and no other household member able to render care: Secondary | ICD-10-CM | POA: Diagnosis not present

## 2021-02-05 ENCOUNTER — Ambulatory Visit: Payer: Medicare PPO | Admitting: Student

## 2021-02-05 ENCOUNTER — Other Ambulatory Visit: Payer: Self-pay

## 2021-02-05 ENCOUNTER — Encounter: Payer: Self-pay | Admitting: Student

## 2021-02-05 VITALS — BP 120/68 | HR 90 | Temp 98.0°F | Ht 65.0 in | Wt 152.0 lb

## 2021-02-05 DIAGNOSIS — I480 Paroxysmal atrial fibrillation: Secondary | ICD-10-CM

## 2021-02-05 DIAGNOSIS — I951 Orthostatic hypotension: Secondary | ICD-10-CM

## 2021-02-05 MED ORDER — HYDROCHLOROTHIAZIDE 12.5 MG PO TABS: 12.5000 mg | ORAL_TABLET | Freq: Every morning | ORAL | 3 refills | Status: DC

## 2021-02-05 NOTE — Progress Notes (Signed)
Primary Physician/Referring:  Deland Pretty, MD  [name], female    DOB: 14-Apr-1940, 80 y.o.   MRN: [id number]  Chief Complaint  Patient presents with   Atrial Fibrillation   Follow-up   HPI:    Cynthia [name]  is a 80 y.o. Caucasian female with hypertension, hyperlipidemia, frequent UTI,  paroxysmal atrial fibrillation, atrial tachycardia, obstructive sleep apnea on CPAP. Tolerating anticoagulation well. Flecainide discontinued due to dizziness. In the past she has had recurrent episodes of near syncope and severe orthostatic changes with recurrent UTI.  However patient with episodes of syncope and near syncope not associated with UTI.  Patient underwent surgery to her right foot 03/04/2020 and had an episode of syncope while at rehabilitation facility on 04/10/2020.   Patient presents for 4-week follow-up.  At last office visit patient had had a syncopal episode likely secondary to dehydration due to vomiting.  Blood pressure was soft, therefore reduce hydrochlorothiazide from 25 mg to 12.5 mg daily.  Patient's blood pressure has improved and she has had no recurrence of syncope or near syncope since last office visit.  She is tolerating reduced dose of hydrochlorothiazide without issue.  Notably patient is under significant stress as her husband is presently in the emergency department.  Denies chest pain, palpitations, dyspnea, syncope, near syncope.  Denies orthopnea, PND.  Patient is accompanied by her daughter at today's office visit.  Past Medical History:  Diagnosis Date   Acute blood loss anemia 10/27/2017   Acute GI bleeding 10/27/2017   Anemia    Arthritis    "joints" (11/10/2017)   Dizziness    Encounter for loop recorder check 06/09/2020   GERD (gastroesophageal reflux disease)    GI bleed 11/06/2017   Hearing loss    History of blood transfusion 10/2017   Hyperlipidemia    Hyperthyroidism    Loop recorder [employer] Loop Recorder 05/23/2020 05/23/2020    Melanoma ([place])    "cut off my back"   Memory loss    Sinus headache    Sleep apnea    Syncope and collapse    Past Surgical History:  Procedure Laterality Date   ABDOMINAL HYSTERECTOMY     "partial"   APPENDECTOMY     BIOPSY  11/17/2017   Procedure: BIOPSY;  Surgeon: Clarene Essex, MD;  Location: [facility];  Service: Endoscopy;;   COLONOSCOPY WITH PROPOFOL N/A 11/17/2017   Procedure: COLONOSCOPY WITH PROPOFOL;  Surgeon: Clarene Essex, MD;  Location: [facility];  Service: Endoscopy;  Laterality: N/A;   CRANIECTOMY FOR EXCISION OF ACOUSTIC NEUROMA     ESOPHAGOGASTRODUODENOSCOPY (EGD) WITH PROPOFOL N/A 10/28/2017   Procedure: ESOPHAGOGASTRODUODENOSCOPY (EGD) WITH PROPOFOL;  Surgeon: Clarene Essex, MD;  Location: [facility];  Service: Endoscopy;  Laterality: N/A;   FOOT SURGERY Bilateral 04/18/2020   KNEE ARTHROSCOPY Left    LEFT HEART CATH AND CORONARY ANGIOGRAPHY N/A 11/24/2016   Procedure: LEFT HEART CATH AND CORONARY ANGIOGRAPHY;  Surgeon: Jettie Booze, MD;  Location: [facility];  Service: Cardiovascular;  Laterality: N/A;   MELANOMA EXCISION     "off my back"   NASAL SINUS SURGERY     TONSILLECTOMY     Family History  Problem Relation Age of Onset   Congestive Heart Failure Mother    Heart disease Father        History of heart attacks at a later age.     Heart disease Brother    Hearing loss Brother     Social  History   Tobacco Use   Smoking status: Former    Years: 2.00    Types: Cigarettes    Quit date: 1980    Years since quitting: 42.8   Smokeless tobacco: Never   Tobacco comments:    11/10/2017 "only smoked when I was on my period"  Substance Use Topics   Alcohol use: Yes    Comment: 11/10/2017 "nothing lately; did have 1 glass of wine q night"  Marital Status: Married   ROS  Review of Systems  Constitutional: Negative for malaise/fatigue and weight gain.  Cardiovascular:  Negative for chest pain, claudication, dyspnea on exertion, leg swelling,  near-syncope, orthopnea, palpitations, paroxysmal nocturnal dyspnea and syncope.  Respiratory:  Negative for shortness of breath.   Hematologic/Lymphatic: Does not bruise/bleed easily.  Gastrointestinal:  Negative for melena.  Neurological:  Negative for dizziness, light-headedness and weakness.  Objective  Blood pressure 120/68, pulse 90, temperature 98 F (36.7 C), temperature source Temporal, height '5\' 5"'  (1.651 m), weight 152 lb (68.9 kg), SpO2 97 %.  Vitals with BMI 02/05/2021 12/26/2020 12/25/2020  Height '5\' 5"'  '5\' 5"'  -  Weight 152 lbs 147 lbs 10 oz -  BMI 57.01 77.93 -  Systolic 903 009 233  Diastolic 68 51 69  Pulse 90 66 80    Physical Exam Vitals reviewed.  Constitutional:      General: She is not in acute distress.    Comments: Patient using walker today.  HENT:     Head: Normocephalic and atraumatic.  Cardiovascular:     Rate and Rhythm: Normal rate and regular rhythm.     Pulses: Intact distal pulses.     Heart sounds: Normal heart sounds, S1 normal and S2 normal. No murmur heard.   No gallop.     Comments: No JVD. Pulmonary:     Effort: Pulmonary effort is normal. No respiratory distress.     Breath sounds: Normal breath sounds. No wheezing, rhonchi or rales.  Musculoskeletal:     Right lower leg: No edema.     Left lower leg: No edema.  Skin:    General: Skin is warm and dry.  Neurological:     Mental Status: She is alert.   Laboratory examination:   Recent Labs    03/26/20 0659 04/10/20 1034 12/25/20 2112  NA 132* 144 135  K 3.4* 3.2* 3.4*  CL 97* 107 99  CO2 '22 27 25  ' GLUCOSE 114* 105* 115*  BUN 11 19 31*  CREATININE 0.57 0.66 0.91  CALCIUM 9.9 9.3 9.1  GFRNONAA >60 >60 >60   CrCl cannot be calculated (Patient's most recent lab result is older than the maximum 21 days allowed.).  CMP Latest Ref Rng & Units 12/25/2020 04/10/2020 03/26/2020  Glucose 70 - 99 mg/dL 115(H) 105(H) 114(H)  BUN 8 - 23 mg/dL 31(H) 19 11  Creatinine 0.44 - 1.00 mg/dL  0.91 0.66 0.57  Sodium 135 - 145 mmol/L 135 144 132(L)  Potassium 3.5 - 5.1 mmol/L 3.4(L) 3.2(L) 3.4(L)  Chloride 98 - 111 mmol/L 99 107 97(L)  CO2 22 - 32 mmol/L '25 27 22  ' Calcium 8.9 - 10.3 mg/dL 9.1 9.3 9.9  Total Protein 6.5 - 8.1 g/dL - 6.1(L) -  Total Bilirubin 0.3 - 1.2 mg/dL - 0.8 -  Alkaline Phos 38 - 126 U/L - 47 -  AST 15 - 41 U/L - 20 -  ALT 0 - 44 U/L - 19 -   CBC Latest Ref Rng & Units 12/25/2020  04/10/2020 03/25/2020  WBC 4.0 - 10.5 K/uL 7.9 6.2 8.2  Hemoglobin 12.0 - 15.0 g/dL 11.7(L) 11.8(L) 12.6  Hematocrit 36.0 - 46.0 % 35.6(L) 35.7(L) 34.8(L)  Platelets 150 - 400 K/uL 213 280 245   Lipid Panel     Component Value Date/Time   CHOL 165 11/24/2016 0040   TRIG 88 11/24/2016 0040   HDL 61 11/24/2016 0040   CHOLHDL 2.7 11/24/2016 0040   VLDL 18 11/24/2016 0040   LDLCALC 86 11/24/2016 0040   HEMOGLOBIN A1C Lab Results  Component Value Date   HGBA1C 5.6 11/24/2016   MPG 114 11/24/2016   TSH Recent Labs    03/21/20 1219  TSH 0.974    External labs:  06/13/2020: Glucose 89, sodium 141, potassium 3.8, BUN 34, creatinine 0.8, GFR >60, AST 16, ALT 23, alk phos 40  03/11/2020: Hemoglobin 10.9, hematocrit 34.2, platelet 241, MCV 94.7 Total cholesterol 165, triglycerides 56, HDL 65, LDL 89 TSH 0.24  Cholesterol, total 145.000 m 09/01/2019 HDL 67.000 mg 10/13/2019 LDL-C 94.000 cal 10/13/2019 Triglycerides 80.000 mg 10/13/2019  Hemoglobin 10.900 g/d 01/24/2020 INR 1.160 11/06/2017 Platelets 197.000 K/ 01/24/2020  Creatinine, Serum 0.580 mg/ 01/24/2020 Potassium 3.500 mm 01/24/2020 ALT (SGPT) 15.000 IU/ 09/01/2019  TSH 0.870 10/13/2019  Hemoglobin 9.300 12/14/2018;  INR 1.160 11/06/2017 Platelets 188.000 12/14/2018  Creatinine, Serum 0.970 12/14/2018 Potassium 4.000 12/14/2018 Magnesium N/D ALT (SGPT) 15.000 12/14/2018  Labs 11/02/2018: BUN 26, creatinine 0.9, EGFR greater than 60 mL, Hb 10.6/HCT 32.2, platelets 244.   Allergies   Allergies  Allergen Reactions    Flecainide Other (See Comments)    Severe dizziness   Avelox [Moxifloxacin Hcl In Nacl] Other (See Comments)    Unknown reaction   Ciprofloxacin     Other reaction(s): multiple medication interactions   Other Other (See Comments)    Avelox+cymbalta Together Other reaction(s): flu like symptoms   Oxycodone Other (See Comments)    uphoria   Trimethoprim Other (See Comments)    Other reaction(s): hives   Alendronate Sodium Nausea Only      Medications Prior to Visit:   Outpatient Medications Prior to Visit  Medication Sig Dispense Refill   acetaminophen (TYLENOL) 500 MG tablet Take 500 mg by mouth daily.     Alpha-Lipoic Acid (LIPOIC ACID PO) Take 600 mg by mouth daily.     apixaban (ELIQUIS) 5 MG TABS tablet Take 1 tablet (5 mg total) by mouth 2 (two) times daily. With breakfast and dinner 60 tablet 5   atorvastatin (LIPITOR) 10 MG tablet Take 1 tablet (10 mg total) by mouth daily. At night 30 tablet 11   Calcium Carbonate-Vitamin D3 600-400 MG-UNIT TABS Take 1 tablet by mouth daily.     cycloSPORINE (RESTASIS) 0.05 % ophthalmic emulsion Place 1 drop into both eyes 2 (two) times daily.     desmopressin (DDAVP) 0.2 MG tablet TAKE 1 TABLET BY MOUTH NIGHTLY. SLOWLY INCREASE AS NEEDED TO 3 TABLETS NIGHTLY     diltiazem (CARDIZEM CD) 120 MG 24 hr capsule TAKE 1 CAPSULE BY MOUTH EVERY DAY 90 capsule 1   fluticasone (FLONASE) 50 MCG/ACT nasal spray Place 1 spray into the nose daily as needed for allergies.      losartan (COZAAR) 100 MG tablet Take 100 mg by mouth every evening.     meclizine (ANTIVERT) 25 MG tablet Take 25 mg by mouth daily as needed for dizziness.     Melatonin 5 MG CAPS Take 5 mg by mouth at bedtime.  metoprolol succinate (TOPROL-XL) 25 MG 24 hr tablet TAKE 1 TABLET BY MOUTH DAILY. TAKE WITH OR IMMEDIATELY FOLLOWING A MEAL. 90 tablet 1   Multiple Vitamin (MULTIVITAMIN WITH MINERALS) TABS tablet Take 1 tablet by mouth daily.     nitrofurantoin (MACRODANTIN) 100 MG  capsule Take 100 mg by mouth daily.     nitroGLYCERIN (NITROLINGUAL) 0.4 MG/SPRAY spray PLACE 1 SPRAY UNDER THE TONGUE EVERY 5 (FIVE) MINUTES FOR 3 DOSES AS NEEDED FOR CHEST PAIN (Patient taking differently: Place 1 spray under the tongue every 5 (five) minutes x 3 doses as needed for chest pain.) 12 g 1   Omega-3 1000 MG CAPS Take 2,000 mg by mouth daily.      pantoprazole (PROTONIX) 40 MG tablet Take 40 mg by mouth daily before breakfast.   1   thyroid (ARMOUR) 60 MG tablet Take 60 mg by mouth daily before breakfast.     traMADol (ULTRAM) 50 MG tablet Take 1 tablet by mouth as needed.     vitamin C (ASCORBIC ACID) 500 MG tablet Take 500 mg by mouth daily.      hydrochlorothiazide (HYDRODIURIL) 25 MG tablet Take 25 mg by mouth in the morning.     DULoxetine (CYMBALTA) 30 MG capsule TAKE 1 CAPSULE BY MOUTH TWICE A DAY (Patient not taking: Reported on 02/05/2021) 180 capsule 3   GLUCOSAMINE-CHONDROITIN PO Take 1 tablet by mouth daily at 12 noon. (Patient not taking: Reported on 02/05/2021)     tiZANidine (ZANAFLEX) 4 MG tablet Take 1 tablet (4 mg total) by mouth 3 (three) times daily as needed. (Patient not taking: Reported on 02/05/2021) 90 tablet 5   No facility-administered medications prior to visit.     Final Medications at End of Visit    Current Meds  Medication Sig   acetaminophen (TYLENOL) 500 MG tablet Take 500 mg by mouth daily.   Alpha-Lipoic Acid (LIPOIC ACID PO) Take 600 mg by mouth daily.   apixaban (ELIQUIS) 5 MG TABS tablet Take 1 tablet (5 mg total) by mouth 2 (two) times daily. With breakfast and dinner   atorvastatin (LIPITOR) 10 MG tablet Take 1 tablet (10 mg total) by mouth daily. At night   Calcium Carbonate-Vitamin D3 600-400 MG-UNIT TABS Take 1 tablet by mouth daily.   cycloSPORINE (RESTASIS) 0.05 % ophthalmic emulsion Place 1 drop into both eyes 2 (two) times daily.   desmopressin (DDAVP) 0.2 MG tablet TAKE 1 TABLET BY MOUTH NIGHTLY. SLOWLY INCREASE AS NEEDED TO 3  TABLETS NIGHTLY   diltiazem (CARDIZEM CD) 120 MG 24 hr capsule TAKE 1 CAPSULE BY MOUTH EVERY DAY   fluticasone (FLONASE) 50 MCG/ACT nasal spray Place 1 spray into the nose daily as needed for allergies.    losartan (COZAAR) 100 MG tablet Take 100 mg by mouth every evening.   meclizine (ANTIVERT) 25 MG tablet Take 25 mg by mouth daily as needed for dizziness.   Melatonin 5 MG CAPS Take 5 mg by mouth at bedtime.    metoprolol succinate (TOPROL-XL) 25 MG 24 hr tablet TAKE 1 TABLET BY MOUTH DAILY. TAKE WITH OR IMMEDIATELY FOLLOWING A MEAL.   Multiple Vitamin (MULTIVITAMIN WITH MINERALS) TABS tablet Take 1 tablet by mouth daily.   nitrofurantoin (MACRODANTIN) 100 MG capsule Take 100 mg by mouth daily.   nitroGLYCERIN (NITROLINGUAL) 0.4 MG/SPRAY spray PLACE 1 SPRAY UNDER THE TONGUE EVERY 5 (FIVE) MINUTES FOR 3 DOSES AS NEEDED FOR CHEST PAIN (Patient taking differently: Place 1 spray under the tongue every 5 (five)  minutes x 3 doses as needed for chest pain.)   Omega-3 1000 MG CAPS Take 2,000 mg by mouth daily.    pantoprazole (PROTONIX) 40 MG tablet Take 40 mg by mouth daily before breakfast.    thyroid (ARMOUR) 60 MG tablet Take 60 mg by mouth daily before breakfast.   traMADol (ULTRAM) 50 MG tablet Take 1 tablet by mouth as needed.   vitamin C (ASCORBIC ACID) 500 MG tablet Take 500 mg by mouth daily.    [DISCONTINUED] hydrochlorothiazide (HYDRODIURIL) 25 MG tablet Take 25 mg by mouth in the morning.      Radiology:   No results found.  Cardiac Studies:   Event Monitor for 30 days Start date 07/01/2018 for syncope : A. Fib one episode, no symptoms reported. No heart block or significant bradycardia  Carotid duplex 03/27/2018:  Right Carotid: Velocities in the right ICA are consistent with a 1-39% stenosis. Left Carotid: Velocities in the left ICA are consistent with a 1-39% stenosis. Vertebrals:  Bilateral vertebral arteries demonstrate antegrade flow. Subclavians: Normal flow hemodynamics  were seen in bilateral subclavian arteries.   Event Monitor 30 days 11/11/2017: Predominant rhythm is normal sinus rhythm. Symptoms of fatigue reveals normal sinus rhythm. Asymptomatic atrial fibrillation and probable atypical atrial flutter noted on 11/13/2017, 11/18/2017 and 12/09/2017 at 3:00 AM. Occasional PACs. Arrhythmia/PVC burden 6%.   Echo 11/09/2017: Left ventricle: The cavity size was normal. Systolic function was   normal. The estimated ejection fraction was in the range of 60% to 65%. Wall motion was normal; there were no regional wall   motion abnormalities. The study is not technically sufficient to   allow evaluation of LV diastolic function.Pulmonary arteries: PA peak pressure: 32 mm Hg (S).   Coronary angiogram 11/24/2016:  Normal coronaries; tortuous LAD   Loop Recorder   Loop recorder implantation 05/23/2020  Scheduled Remote loop recorder check 06/04/2020: Unremarkable transmission. Occasional PVC. No heart block, no atrial fibrillation. No symptoms reported  Remote loop transmission 11/10/2020: Predominant rhythm = normal sinus rhythm. There were 2 high ventricular rate episodes since 05/24/2020, EGM = A. fib with RVR. Longest 20 minutes. AF burden <0.1%. There were no significant bradycardia or ventricular pauses. Triggered recordings: Feature turned off.   EKG:  02/05/2021: Sinus rhythm at a rate of 85 bpm.  Left axis.  Left atrial monitor.  Left anterior fascicular block.  Incomplete right bundle branch block.  Nonspecific T wave abnormality.  Compared EKG 08/2020, no significant change.  EKG 05/17/2020: Sinus rhythm at a rate of 60 bpm, left atrial enlargement.  Left axis, left anterior fascicular block.  Nonspecific T wave abnormality.  Compared to EKG 04/19/2020, incomplete right bundle branch block and poor progression not present.  EKG 04/19/2020: Sinus rhythm at a rate of 79 bpm, left atrial enlargement.  Left axis, left anterior fascicular block.  Incomplete right  bundle branch block.  Poor R wave progression, cannot exclude anteroseptal infarct old.  Nonspecific T wave abnormality.  Compared to EKG 02/02/2020, no significant change.  EKG 02/02/2020: Sinus tachycardia at the rate of 103 bpm, left axis deviation, left anterior fascicular block.  Incomplete right bundle branch block.  Poor R wave progression, cannot exclude anteroseptal infarct old.  Nonspecific T abnormality.  No significant change from 06/12/2019.  EKG 06/20/2018: Atrial tachycardia at the rate of 133 bpm.  Left axis deviation, left anterior fascicular block.  Poor R-wave progression.  Incomplete right bundle branch block.  Assessment     ICD-10-CM   1. Orthostatic  hypotension  I95.1     2. Paroxysmal atrial fibrillation (HCC)  I48.0 EKG 12-Lead       Meds ordered this encounter  Medications   hydrochlorothiazide (HYDRODIURIL) 12.5 MG tablet    Sig: Take 1 tablet (12.5 mg total) by mouth in the morning.    Dispense:  90 tablet    Refill:  3     Medications Discontinued During This Encounter  Medication Reason   GLUCOSAMINE-CHONDROITIN PO Error   tiZANidine (ZANAFLEX) 4 MG tablet Error   hydrochlorothiazide (HYDRODIURIL) 25 MG tablet Reorder   DULoxetine (CYMBALTA) 30 MG capsule Patient has not taken in last 30 days     This patients CHA2DS2-VASc Score 4 (HTN, F, Age)and yearly risk of stroke 4.8%.   Recommendations:   Cynthia [name]  is a 80 y.o. Caucasian female with hypertension, hyperlipidemia, frequent UTI,  paroxysmal atrial fibrillation, atrial tachycardia, obstructive sleep apnea on CPAP. Tolerating anticoagulation well. Flecainide discontinued due to dizziness. In the past she has had recurrent episodes of near syncope and severe orthostatic changes with recurrent UTI.  However patient with episodes of syncope and near syncope not associated with UTI.  Patient underwent surgery to her right foot 03/04/2020 and had an episode of syncope while at rehabilitation  facility on 04/10/2020.   Patient presents for 4-week follow-up.  At last office visit patient had had a syncopal episode likely secondary to dehydration due to vomiting.  Blood pressure was soft, therefore reduce hydrochlorothiazide from 25 mg to 12.5 mg daily.  Patient is doing well with reduced dose of hydrochlorothiazide.  She has had no recurrence of syncope or near syncope, however her blood pressure remains well controlled.  We will continue hydrochlorothiazide 12.5 mg once daily.  Will not make changes to medications at this time.  Patient is otherwise stable from a cardiovascular standpoint, and clinically maintaining sinus rhythm.  Follow-up in 6 months, sooner if needed, for atrial fibrillation, hypertension, hyperlipidemia.   Alethia Berthold, PA-C 02/05/2021, 12:25 PM Office: [phone]

## 2021-02-07 DIAGNOSIS — M19012 Primary osteoarthritis, left shoulder: Secondary | ICD-10-CM | POA: Diagnosis not present

## 2021-02-07 DIAGNOSIS — K219 Gastro-esophageal reflux disease without esophagitis: Secondary | ICD-10-CM | POA: Diagnosis not present

## 2021-02-07 DIAGNOSIS — F419 Anxiety disorder, unspecified: Secondary | ICD-10-CM | POA: Diagnosis not present

## 2021-02-07 DIAGNOSIS — J069 Acute upper respiratory infection, unspecified: Secondary | ICD-10-CM | POA: Diagnosis not present

## 2021-02-07 DIAGNOSIS — E785 Hyperlipidemia, unspecified: Secondary | ICD-10-CM | POA: Diagnosis not present

## 2021-02-07 DIAGNOSIS — E049 Nontoxic goiter, unspecified: Secondary | ICD-10-CM | POA: Diagnosis not present

## 2021-02-07 DIAGNOSIS — I10 Essential (primary) hypertension: Secondary | ICD-10-CM | POA: Diagnosis not present

## 2021-02-07 DIAGNOSIS — M1711 Unilateral primary osteoarthritis, right knee: Secondary | ICD-10-CM | POA: Diagnosis not present

## 2021-02-07 DIAGNOSIS — I8390 Asymptomatic varicose veins of unspecified lower extremity: Secondary | ICD-10-CM | POA: Diagnosis not present

## 2021-02-10 DIAGNOSIS — Z95818 Presence of other cardiac implants and grafts: Secondary | ICD-10-CM | POA: Diagnosis not present

## 2021-02-10 DIAGNOSIS — Z4509 Encounter for adjustment and management of other cardiac device: Secondary | ICD-10-CM | POA: Diagnosis not present

## 2021-02-10 DIAGNOSIS — R55 Syncope and collapse: Secondary | ICD-10-CM | POA: Diagnosis not present

## 2021-02-12 ENCOUNTER — Other Ambulatory Visit (HOSPITAL_COMMUNITY): Payer: Self-pay | Admitting: Orthopedic Surgery

## 2021-02-12 ENCOUNTER — Other Ambulatory Visit: Payer: Self-pay | Admitting: Orthopedic Surgery

## 2021-02-12 DIAGNOSIS — M25551 Pain in right hip: Secondary | ICD-10-CM

## 2021-02-13 DIAGNOSIS — I8390 Asymptomatic varicose veins of unspecified lower extremity: Secondary | ICD-10-CM | POA: Diagnosis not present

## 2021-02-13 DIAGNOSIS — M19012 Primary osteoarthritis, left shoulder: Secondary | ICD-10-CM | POA: Diagnosis not present

## 2021-02-13 DIAGNOSIS — F419 Anxiety disorder, unspecified: Secondary | ICD-10-CM | POA: Diagnosis not present

## 2021-02-13 DIAGNOSIS — J069 Acute upper respiratory infection, unspecified: Secondary | ICD-10-CM | POA: Diagnosis not present

## 2021-02-13 DIAGNOSIS — M1711 Unilateral primary osteoarthritis, right knee: Secondary | ICD-10-CM | POA: Diagnosis not present

## 2021-02-13 DIAGNOSIS — E785 Hyperlipidemia, unspecified: Secondary | ICD-10-CM | POA: Diagnosis not present

## 2021-02-13 DIAGNOSIS — K219 Gastro-esophageal reflux disease without esophagitis: Secondary | ICD-10-CM | POA: Diagnosis not present

## 2021-02-13 DIAGNOSIS — E049 Nontoxic goiter, unspecified: Secondary | ICD-10-CM | POA: Diagnosis not present

## 2021-02-13 DIAGNOSIS — I10 Essential (primary) hypertension: Secondary | ICD-10-CM | POA: Diagnosis not present

## 2021-02-18 DIAGNOSIS — M79661 Pain in right lower leg: Secondary | ICD-10-CM | POA: Diagnosis not present

## 2021-02-18 DIAGNOSIS — R2689 Other abnormalities of gait and mobility: Secondary | ICD-10-CM | POA: Diagnosis not present

## 2021-02-18 DIAGNOSIS — Z6379 Other stressful life events affecting family and household: Secondary | ICD-10-CM | POA: Insufficient documentation

## 2021-02-18 DIAGNOSIS — R413 Other amnesia: Secondary | ICD-10-CM | POA: Diagnosis not present

## 2021-02-18 DIAGNOSIS — N3289 Other specified disorders of bladder: Secondary | ICD-10-CM | POA: Diagnosis not present

## 2021-02-18 DIAGNOSIS — G3184 Mild cognitive impairment, so stated: Secondary | ICD-10-CM | POA: Diagnosis not present

## 2021-02-18 DIAGNOSIS — M79662 Pain in left lower leg: Secondary | ICD-10-CM | POA: Diagnosis not present

## 2021-02-18 DIAGNOSIS — R32 Unspecified urinary incontinence: Secondary | ICD-10-CM | POA: Diagnosis not present

## 2021-02-19 DIAGNOSIS — R300 Dysuria: Secondary | ICD-10-CM | POA: Diagnosis not present

## 2021-02-19 DIAGNOSIS — N952 Postmenopausal atrophic vaginitis: Secondary | ICD-10-CM | POA: Diagnosis not present

## 2021-02-19 DIAGNOSIS — R351 Nocturia: Secondary | ICD-10-CM | POA: Diagnosis not present

## 2021-02-19 DIAGNOSIS — N390 Urinary tract infection, site not specified: Secondary | ICD-10-CM | POA: Diagnosis not present

## 2021-02-20 DIAGNOSIS — M19012 Primary osteoarthritis, left shoulder: Secondary | ICD-10-CM | POA: Diagnosis not present

## 2021-02-20 DIAGNOSIS — M1711 Unilateral primary osteoarthritis, right knee: Secondary | ICD-10-CM | POA: Diagnosis not present

## 2021-02-20 DIAGNOSIS — K219 Gastro-esophageal reflux disease without esophagitis: Secondary | ICD-10-CM | POA: Diagnosis not present

## 2021-02-20 DIAGNOSIS — E785 Hyperlipidemia, unspecified: Secondary | ICD-10-CM | POA: Diagnosis not present

## 2021-02-20 DIAGNOSIS — I8390 Asymptomatic varicose veins of unspecified lower extremity: Secondary | ICD-10-CM | POA: Diagnosis not present

## 2021-02-20 DIAGNOSIS — F419 Anxiety disorder, unspecified: Secondary | ICD-10-CM | POA: Diagnosis not present

## 2021-02-20 DIAGNOSIS — J069 Acute upper respiratory infection, unspecified: Secondary | ICD-10-CM | POA: Diagnosis not present

## 2021-02-20 DIAGNOSIS — I10 Essential (primary) hypertension: Secondary | ICD-10-CM | POA: Diagnosis not present

## 2021-02-20 DIAGNOSIS — E049 Nontoxic goiter, unspecified: Secondary | ICD-10-CM | POA: Diagnosis not present

## 2021-02-21 DIAGNOSIS — I8390 Asymptomatic varicose veins of unspecified lower extremity: Secondary | ICD-10-CM | POA: Diagnosis not present

## 2021-02-21 DIAGNOSIS — J069 Acute upper respiratory infection, unspecified: Secondary | ICD-10-CM | POA: Diagnosis not present

## 2021-02-21 DIAGNOSIS — E785 Hyperlipidemia, unspecified: Secondary | ICD-10-CM | POA: Diagnosis not present

## 2021-02-21 DIAGNOSIS — K219 Gastro-esophageal reflux disease without esophagitis: Secondary | ICD-10-CM | POA: Diagnosis not present

## 2021-02-21 DIAGNOSIS — I10 Essential (primary) hypertension: Secondary | ICD-10-CM | POA: Diagnosis not present

## 2021-02-21 DIAGNOSIS — F419 Anxiety disorder, unspecified: Secondary | ICD-10-CM | POA: Diagnosis not present

## 2021-02-21 DIAGNOSIS — M19012 Primary osteoarthritis, left shoulder: Secondary | ICD-10-CM | POA: Diagnosis not present

## 2021-02-21 DIAGNOSIS — M1711 Unilateral primary osteoarthritis, right knee: Secondary | ICD-10-CM | POA: Diagnosis not present

## 2021-02-21 DIAGNOSIS — E049 Nontoxic goiter, unspecified: Secondary | ICD-10-CM | POA: Diagnosis not present

## 2021-02-25 ENCOUNTER — Other Ambulatory Visit: Payer: Self-pay | Admitting: Student

## 2021-02-25 DIAGNOSIS — I8390 Asymptomatic varicose veins of unspecified lower extremity: Secondary | ICD-10-CM | POA: Diagnosis not present

## 2021-02-25 DIAGNOSIS — F419 Anxiety disorder, unspecified: Secondary | ICD-10-CM | POA: Diagnosis not present

## 2021-02-25 DIAGNOSIS — I10 Essential (primary) hypertension: Secondary | ICD-10-CM | POA: Diagnosis not present

## 2021-02-25 DIAGNOSIS — E785 Hyperlipidemia, unspecified: Secondary | ICD-10-CM | POA: Diagnosis not present

## 2021-02-25 DIAGNOSIS — K219 Gastro-esophageal reflux disease without esophagitis: Secondary | ICD-10-CM | POA: Diagnosis not present

## 2021-02-25 DIAGNOSIS — M1711 Unilateral primary osteoarthritis, right knee: Secondary | ICD-10-CM | POA: Diagnosis not present

## 2021-02-25 DIAGNOSIS — M19012 Primary osteoarthritis, left shoulder: Secondary | ICD-10-CM | POA: Diagnosis not present

## 2021-02-25 DIAGNOSIS — E049 Nontoxic goiter, unspecified: Secondary | ICD-10-CM | POA: Diagnosis not present

## 2021-02-25 DIAGNOSIS — J069 Acute upper respiratory infection, unspecified: Secondary | ICD-10-CM | POA: Diagnosis not present

## 2021-02-28 DIAGNOSIS — M1711 Unilateral primary osteoarthritis, right knee: Secondary | ICD-10-CM | POA: Diagnosis not present

## 2021-02-28 DIAGNOSIS — E049 Nontoxic goiter, unspecified: Secondary | ICD-10-CM | POA: Diagnosis not present

## 2021-02-28 DIAGNOSIS — J069 Acute upper respiratory infection, unspecified: Secondary | ICD-10-CM | POA: Diagnosis not present

## 2021-02-28 DIAGNOSIS — F419 Anxiety disorder, unspecified: Secondary | ICD-10-CM | POA: Diagnosis not present

## 2021-02-28 DIAGNOSIS — I8390 Asymptomatic varicose veins of unspecified lower extremity: Secondary | ICD-10-CM | POA: Diagnosis not present

## 2021-02-28 DIAGNOSIS — I10 Essential (primary) hypertension: Secondary | ICD-10-CM | POA: Diagnosis not present

## 2021-02-28 DIAGNOSIS — E785 Hyperlipidemia, unspecified: Secondary | ICD-10-CM | POA: Diagnosis not present

## 2021-02-28 DIAGNOSIS — K219 Gastro-esophageal reflux disease without esophagitis: Secondary | ICD-10-CM | POA: Diagnosis not present

## 2021-02-28 DIAGNOSIS — M19012 Primary osteoarthritis, left shoulder: Secondary | ICD-10-CM | POA: Diagnosis not present

## 2021-03-03 ENCOUNTER — Telehealth: Payer: Self-pay | Admitting: *Deleted

## 2021-03-03 ENCOUNTER — Ambulatory Visit (INDEPENDENT_AMBULATORY_CARE_PROVIDER_SITE_OTHER): Payer: Medicare PPO | Admitting: Neurology

## 2021-03-03 ENCOUNTER — Encounter: Payer: Self-pay | Admitting: Neurology

## 2021-03-03 ENCOUNTER — Ambulatory Visit: Payer: Medicare PPO | Admitting: Neurology

## 2021-03-03 VITALS — BP 113/67 | HR 67 | Ht 65.0 in | Wt 152.0 lb

## 2021-03-03 DIAGNOSIS — R269 Unspecified abnormalities of gait and mobility: Secondary | ICD-10-CM | POA: Diagnosis not present

## 2021-03-03 DIAGNOSIS — N390 Urinary tract infection, site not specified: Secondary | ICD-10-CM | POA: Diagnosis not present

## 2021-03-03 DIAGNOSIS — G629 Polyneuropathy, unspecified: Secondary | ICD-10-CM | POA: Diagnosis not present

## 2021-03-03 DIAGNOSIS — G912 (Idiopathic) normal pressure hydrocephalus: Secondary | ICD-10-CM

## 2021-03-03 DIAGNOSIS — R351 Nocturia: Secondary | ICD-10-CM

## 2021-03-03 NOTE — Progress Notes (Signed)
[facility]  PATIENT: Cynthia [name] DOB: June 28, 1940 _________________________________   HISTORICAL  CHIEF COMPLAINT:  Chief Complaint  Patient presents with   Follow-up    Pt alone rm 1. Presents today for follow up. She had the neuro cognitive testing and was advised overall did well. Still using her CPAP machine. Overall states that is well.  DME [facility]     HISTORY OF PRESENT ILLNESS:  Cynthia [name] is a 80 y.o. woman with polyneuropathy, sleep apnea and memory loss.   She had a cardiac arrest 11/06/2017  Update 03/03/2021: She feels her gait is stable.   She uses a walker and could go 1/4 mile without a stop.   Gait is limited by probable NPH as well as hip pain.   She had a high volume LP 03/04/2020 (35 cc removed).   She and her husband felt that the gait was better for a few days afterwards.  Unfortunately, she was having mental status changes related to oxycodone when she had her neurosurgery appointment.  Therefore, decision was made to wait till the cognitive changes resolved.  She feels that she has been stable since that time and is less interested in proceeding unless symptoms worsen again.  Urinary symptoms are also stable.  Desmopressin helps the frequency at night.  Headaches have been better.  Neck pain is doing better as well.  Neuropathy pain in the feet has improved with alpha lipoic acid OTC.  She is on Eliquis for AFib.  Her daughter has Ehlers-Danlos  She is having more stress.   Her husband has had a 'nervous breakdown' this week and will be admitted for evaluation.  He lost a good friend and had forced retirement.    I reviewed the 03/20/2020 CT scan.  It was unchanged compared to 01/24/2020 and shows ventriculomegaly and microvascular ischemic changes.  She has OSA and uses CPAP every night (though d/L showed great compliance in September and no compliance on October - might be technical)   She feels better when she uses it the whole night.     AHI on the d/l was excellent AT 3.3.      REVIEW OF SYSTEMS: Constitutional: No fevers, chills, sweats, or change in appetite Eyes: No visual changes, double vision, eye pain Ear, nose and throat: Chronic sinusutis.  Bilat hearing loss, worse on left  Cardiovascular: No chest pain, palpitations Respiratory:  No shortness of breath at rest or with exertion.   No wheezes.  Has OSA GastrointestinaI: No nausea, vomiting, diarrhea, abdominal pain, fecal incontinence.   Occ dysphagia Genitourinary:  Frequent UTIs.  SOme frequency. Musculoskeletal:  as above Integumentary: No rash, pruritus, skin lesions Neurological: as above Psychiatric: No depression at this time.  No anxiety Endocrine: No palpitations, diaphoresis, change in appetite.  Increased thirst Hematologic/Lymphatic:  No anemia, purpura, petechiae. Allergic/Immunologic: No itchy/runny eyes, recent allergic reactions, rashes  ALLERGIES: Allergies  Allergen Reactions   Flecainide Other (See Comments)    Severe dizziness   Avelox [Moxifloxacin Hcl In Nacl] Other (See Comments)    Unknown reaction   Ciprofloxacin     Other reaction(s): multiple medication interactions   Other Other (See Comments)    Avelox+cymbalta Together Other reaction(s): flu like symptoms   Oxycodone Other (See Comments)    uphoria   Trimethoprim Other (See Comments)    Other reaction(s): hives   Alendronate Sodium Nausea Only    HOME MEDICATIONS:  Current Outpatient Medications:    acetaminophen (TYLENOL) 500 MG tablet,  Take 500 mg by mouth daily., Disp: , Rfl:    Alpha-Lipoic Acid (LIPOIC ACID PO), Take 600 mg by mouth daily., Disp: , Rfl:    apixaban (ELIQUIS) 5 MG TABS tablet, Take 1 tablet (5 mg total) by mouth 2 (two) times daily. With breakfast and dinner, Disp: 60 tablet, Rfl: 5   atorvastatin (LIPITOR) 10 MG tablet, Take 1 tablet (10 mg total) by mouth daily. At night, Disp: 30 tablet, Rfl: 11   Calcium Carbonate-Vitamin D3 600-400 MG-UNIT  TABS, Take 1 tablet by mouth daily., Disp: , Rfl:    cycloSPORINE (RESTASIS) 0.05 % ophthalmic emulsion, Place 1 drop into both eyes 2 (two) times daily., Disp: , Rfl:    desmopressin (DDAVP) 0.2 MG tablet, TAKE 1 TABLET BY MOUTH NIGHTLY. SLOWLY INCREASE AS NEEDED TO 3 TABLETS NIGHTLY, Disp: , Rfl:    diltiazem (CARDIZEM CD) 120 MG 24 hr capsule, TAKE 1 CAPSULE BY MOUTH EVERY DAY, Disp: 90 capsule, Rfl: 1   fluticasone (FLONASE) 50 MCG/ACT nasal spray, Place 1 spray into the nose daily as needed for allergies. , Disp: , Rfl:    hydrochlorothiazide (HYDRODIURIL) 12.5 MG tablet, Take 1 tablet (12.5 mg total) by mouth in the morning., Disp: 90 tablet, Rfl: 3   losartan (COZAAR) 100 MG tablet, Take 100 mg by mouth every evening., Disp: , Rfl:    meclizine (ANTIVERT) 25 MG tablet, Take 25 mg by mouth daily as needed for dizziness., Disp: , Rfl:    Melatonin 5 MG CAPS, Take 5 mg by mouth at bedtime. , Disp: , Rfl:    metoprolol succinate (TOPROL-XL) 25 MG 24 hr tablet, TAKE 1 TABLET BY MOUTH DAILY WITH OR IMMEDIATELY FOLLOWING A MEAL, Disp: 90 tablet, Rfl: 1   Multiple Vitamin (MULTIVITAMIN WITH MINERALS) TABS tablet, Take 1 tablet by mouth daily., Disp: , Rfl:    nitrofurantoin (MACRODANTIN) 100 MG capsule, Take 100 mg by mouth daily., Disp: , Rfl:    nitroGLYCERIN (NITROLINGUAL) 0.4 MG/SPRAY spray, PLACE 1 SPRAY UNDER THE TONGUE EVERY 5 (FIVE) MINUTES FOR 3 DOSES AS NEEDED FOR CHEST PAIN (Patient taking differently: Place 1 spray under the tongue every 5 (five) minutes x 3 doses as needed for chest pain.), Disp: 12 g, Rfl: 1   Omega-3 1000 MG CAPS, Take 2,000 mg by mouth daily. , Disp: , Rfl:    pantoprazole (PROTONIX) 40 MG tablet, Take 40 mg by mouth daily before breakfast. , Disp: , Rfl: 1   thyroid (ARMOUR) 60 MG tablet, Take 60 mg by mouth daily before breakfast., Disp: , Rfl:    traMADol (ULTRAM) 50 MG tablet, Take 1 tablet by mouth as needed., Disp: , Rfl:    vitamin C (ASCORBIC ACID) 500 MG  tablet, Take 500 mg by mouth daily. , Disp: , Rfl:   PAST MEDICAL HISTORY: Past Medical History:  Diagnosis Date   Acute blood loss anemia 10/27/2017   Acute GI bleeding 10/27/2017   Anemia    Arthritis    "joints" (11/10/2017)   Dizziness    Encounter for loop recorder check 06/09/2020   GERD (gastroesophageal reflux disease)    GI bleed 11/06/2017   Hearing loss    History of blood transfusion 10/2017   Hyperlipidemia    Hyperthyroidism    Loop recorder Biotronik Loop Recorder 05/23/2020 05/23/2020   Melanoma ([place])    "cut off my back"   Memory loss    Sinus headache    Sleep apnea    Syncope and collapse  PAST SURGICAL HISTORY: Past Surgical History:  Procedure Laterality Date   ABDOMINAL HYSTERECTOMY     "partial"   APPENDECTOMY     BIOPSY  11/17/2017   Procedure: BIOPSY;  Surgeon: Clarene Essex, MD;  Location: [facility];  Service: Endoscopy;;   COLONOSCOPY WITH PROPOFOL N/A 11/17/2017   Procedure: COLONOSCOPY WITH PROPOFOL;  Surgeon: Clarene Essex, MD;  Location: [facility];  Service: Endoscopy;  Laterality: N/A;   CRANIECTOMY FOR EXCISION OF ACOUSTIC NEUROMA     ESOPHAGOGASTRODUODENOSCOPY (EGD) WITH PROPOFOL N/A 10/28/2017   Procedure: ESOPHAGOGASTRODUODENOSCOPY (EGD) WITH PROPOFOL;  Surgeon: Clarene Essex, MD;  Location: [facility];  Service: Endoscopy;  Laterality: N/A;   FOOT SURGERY Bilateral 04/18/2020   KNEE ARTHROSCOPY Left    LEFT HEART CATH AND CORONARY ANGIOGRAPHY N/A 11/24/2016   Procedure: LEFT HEART CATH AND CORONARY ANGIOGRAPHY;  Surgeon: Jettie Booze, MD;  Location: [facility];  Service: Cardiovascular;  Laterality: N/A;   MELANOMA EXCISION     "off my back"   NASAL SINUS SURGERY     TONSILLECTOMY      FAMILY HISTORY: Family History  Problem Relation Age of Onset   Congestive Heart Failure Mother    Heart disease Father        History of heart attacks at a later age.     Heart disease Brother    Hearing loss Brother     SOCIAL  HISTORY:  Social History   Socioeconomic History   Marital status: Married    Spouse name: Not on file   Number of children: 2   Years of education: Not on file   Highest education level: Not on file  Occupational History   Not on file  Tobacco Use   Smoking status: Former    Years: 2.00    Types: Cigarettes    Quit date: 1980    Years since quitting: 42.9   Smokeless tobacco: Never   Tobacco comments:    11/10/2017 "only smoked when I was on my period"  Vaping Use   Vaping Use: Never used  Substance and Sexual Activity   Alcohol use: Yes    Comment: 11/10/2017 "nothing lately; did have 1 glass of wine q night"   Drug use: Never   Sexual activity: Not Currently  Other Topics Concern   Not on file  Social History Narrative   Right handed    Social Determinants of Health   Financial Resource Strain: Not on file  Food Insecurity: Not on file  Transportation Needs: Not on file  Physical Activity: Not on file  Stress: Not on file  Social Connections: Not on file  Intimate Partner Violence: Not on file     PHYSICAL EXAM  Vitals:   03/03/21 1457  BP: 113/67  Pulse: 67  Weight: 152 lb (68.9 kg)  Height: 5\' 5"  (1.651 m)     Body mass index is 25.29 kg/m.   General: The patient is well-developed and well-nourished and in no acute distress  Neck:   She notes no neck tenderness at this time.  Range of motion is good.  Skin: Extremities are without significant edema, rash.     Neurologic Exam  Mental status: The patient is alert and oriented x 2 1/2 (wrong date) at the time of the examination.  See MMSE/MoCA scores above for details.Marland Kitchen   Speech is normal.  Cranial nerves: Extraocular movements are full.   Facial strength and sensation is normal.  Trapezius strength is normal.  Reduced hearing on the left.   Motor:  Muscle bulk is normal.   Tone is normal. Strength is  5 / 5 in all 4 extremities.   Sensory: Sensory testing is intact to soft touch in the arms  but reduced sensation to vibration in the toes.  Fairly normal sensation at the ankles.  Coordination: Cerebellar testing shows good finger-nose-finger and heel-to-shin   Gait and station: Station is normal.   Her gait is slightly wide.  With a walker she has a fairly good stride though does poorly without her walker.  Tandem gait is wide..  Reflexes: Symmetric in arms and legs.    ASSESSMENT AND PLAN  NPH (normal pressure hydrocephalus) (HCC)  Polyneuropathy  Nocturia  Gait disturbance   1.   She has ventriculomegaly and a high-volume lumbar puncture had helped her gait for a few days.  Since she feels that she has stabilized over the last year, she is not interested in pursuing a VP shunt at this time.  She will reconsider if symptoms worsen 2.   Continue APAP nightly.   She has some sleepiness.    If this worsens consider a low-dose of stimulant or if she also has sleepiness of wake promoting agent such as Nuvigil.     3  continue alpha lipoic acid 600-800 mg daily for neuropathy 4.   rtc 6 months for scheduled visit if stable.    Jejuan Scala A. Felecia Shelling, MD, PhD 20/80/2233, 6:12 PM Certified in Neurology, Clinical Neurophysiology, Sleep Medicine, Pain Medicine and Neuroimaging  [facility] [address] [place], [place] [address] [phone]

## 2021-03-03 NOTE — Telephone Encounter (Signed)
Called [facility] at [phone] and spoke w/ Coralyn Mark. She will have tech fax last 30 days compliance report to Dr. Garth Bigness attn at [phone]. Aware pt here now. If possible, asking they fax asap.

## 2021-03-04 DIAGNOSIS — L570 Actinic keratosis: Secondary | ICD-10-CM | POA: Diagnosis not present

## 2021-03-04 DIAGNOSIS — L814 Other melanin hyperpigmentation: Secondary | ICD-10-CM | POA: Diagnosis not present

## 2021-03-07 DIAGNOSIS — E049 Nontoxic goiter, unspecified: Secondary | ICD-10-CM | POA: Diagnosis not present

## 2021-03-07 DIAGNOSIS — K219 Gastro-esophageal reflux disease without esophagitis: Secondary | ICD-10-CM | POA: Diagnosis not present

## 2021-03-07 DIAGNOSIS — J069 Acute upper respiratory infection, unspecified: Secondary | ICD-10-CM | POA: Diagnosis not present

## 2021-03-07 DIAGNOSIS — I8390 Asymptomatic varicose veins of unspecified lower extremity: Secondary | ICD-10-CM | POA: Diagnosis not present

## 2021-03-07 DIAGNOSIS — E785 Hyperlipidemia, unspecified: Secondary | ICD-10-CM | POA: Diagnosis not present

## 2021-03-07 DIAGNOSIS — I10 Essential (primary) hypertension: Secondary | ICD-10-CM | POA: Diagnosis not present

## 2021-03-07 DIAGNOSIS — M1711 Unilateral primary osteoarthritis, right knee: Secondary | ICD-10-CM | POA: Diagnosis not present

## 2021-03-07 DIAGNOSIS — F419 Anxiety disorder, unspecified: Secondary | ICD-10-CM | POA: Diagnosis not present

## 2021-03-07 DIAGNOSIS — M19012 Primary osteoarthritis, left shoulder: Secondary | ICD-10-CM | POA: Diagnosis not present

## 2021-03-09 ENCOUNTER — Other Ambulatory Visit: Payer: Self-pay | Admitting: Cardiology

## 2021-03-09 DIAGNOSIS — E782 Mixed hyperlipidemia: Secondary | ICD-10-CM

## 2021-03-11 ENCOUNTER — Ambulatory Visit (HOSPITAL_COMMUNITY): Admission: RE | Admit: 2021-03-11 | Payer: Medicare PPO | Source: Ambulatory Visit

## 2021-03-11 ENCOUNTER — Encounter (HOSPITAL_COMMUNITY): Payer: Self-pay

## 2021-03-11 DIAGNOSIS — E785 Hyperlipidemia, unspecified: Secondary | ICD-10-CM | POA: Diagnosis not present

## 2021-03-11 DIAGNOSIS — I8390 Asymptomatic varicose veins of unspecified lower extremity: Secondary | ICD-10-CM | POA: Diagnosis not present

## 2021-03-11 DIAGNOSIS — K219 Gastro-esophageal reflux disease without esophagitis: Secondary | ICD-10-CM | POA: Diagnosis not present

## 2021-03-11 DIAGNOSIS — M1711 Unilateral primary osteoarthritis, right knee: Secondary | ICD-10-CM | POA: Diagnosis not present

## 2021-03-11 DIAGNOSIS — F419 Anxiety disorder, unspecified: Secondary | ICD-10-CM | POA: Diagnosis not present

## 2021-03-11 DIAGNOSIS — J069 Acute upper respiratory infection, unspecified: Secondary | ICD-10-CM | POA: Diagnosis not present

## 2021-03-11 DIAGNOSIS — M19012 Primary osteoarthritis, left shoulder: Secondary | ICD-10-CM | POA: Diagnosis not present

## 2021-03-11 DIAGNOSIS — E049 Nontoxic goiter, unspecified: Secondary | ICD-10-CM | POA: Diagnosis not present

## 2021-03-11 DIAGNOSIS — I10 Essential (primary) hypertension: Secondary | ICD-10-CM | POA: Diagnosis not present

## 2021-03-13 ENCOUNTER — Other Ambulatory Visit: Payer: Self-pay | Admitting: Physician Assistant

## 2021-03-13 ENCOUNTER — Other Ambulatory Visit (HOSPITAL_COMMUNITY): Payer: Self-pay | Admitting: Physician Assistant

## 2021-03-13 ENCOUNTER — Other Ambulatory Visit: Payer: Self-pay | Admitting: Student

## 2021-03-13 DIAGNOSIS — M1711 Unilateral primary osteoarthritis, right knee: Secondary | ICD-10-CM | POA: Diagnosis not present

## 2021-03-13 DIAGNOSIS — E049 Nontoxic goiter, unspecified: Secondary | ICD-10-CM | POA: Diagnosis not present

## 2021-03-13 DIAGNOSIS — K219 Gastro-esophageal reflux disease without esophagitis: Secondary | ICD-10-CM | POA: Diagnosis not present

## 2021-03-13 DIAGNOSIS — I8390 Asymptomatic varicose veins of unspecified lower extremity: Secondary | ICD-10-CM | POA: Diagnosis not present

## 2021-03-13 DIAGNOSIS — R55 Syncope and collapse: Secondary | ICD-10-CM | POA: Diagnosis not present

## 2021-03-13 DIAGNOSIS — Z95818 Presence of other cardiac implants and grafts: Secondary | ICD-10-CM | POA: Diagnosis not present

## 2021-03-13 DIAGNOSIS — I10 Essential (primary) hypertension: Secondary | ICD-10-CM | POA: Diagnosis not present

## 2021-03-13 DIAGNOSIS — M25561 Pain in right knee: Secondary | ICD-10-CM

## 2021-03-13 DIAGNOSIS — J069 Acute upper respiratory infection, unspecified: Secondary | ICD-10-CM | POA: Diagnosis not present

## 2021-03-13 DIAGNOSIS — F419 Anxiety disorder, unspecified: Secondary | ICD-10-CM | POA: Diagnosis not present

## 2021-03-13 DIAGNOSIS — M19012 Primary osteoarthritis, left shoulder: Secondary | ICD-10-CM | POA: Diagnosis not present

## 2021-03-13 DIAGNOSIS — E785 Hyperlipidemia, unspecified: Secondary | ICD-10-CM | POA: Diagnosis not present

## 2021-03-13 DIAGNOSIS — Z4509 Encounter for adjustment and management of other cardiac device: Secondary | ICD-10-CM | POA: Diagnosis not present

## 2021-03-19 ENCOUNTER — Other Ambulatory Visit: Payer: Self-pay

## 2021-03-19 DIAGNOSIS — R0789 Other chest pain: Secondary | ICD-10-CM

## 2021-03-19 MED ORDER — NITROGLYCERIN 0.4 MG/SPRAY TL SOLN: 1.0000 | 1 refills | Status: DC | PRN

## 2021-03-20 DIAGNOSIS — I8390 Asymptomatic varicose veins of unspecified lower extremity: Secondary | ICD-10-CM | POA: Diagnosis not present

## 2021-03-20 DIAGNOSIS — M19012 Primary osteoarthritis, left shoulder: Secondary | ICD-10-CM | POA: Diagnosis not present

## 2021-03-20 DIAGNOSIS — I10 Essential (primary) hypertension: Secondary | ICD-10-CM | POA: Diagnosis not present

## 2021-03-20 DIAGNOSIS — F419 Anxiety disorder, unspecified: Secondary | ICD-10-CM | POA: Diagnosis not present

## 2021-03-20 DIAGNOSIS — J069 Acute upper respiratory infection, unspecified: Secondary | ICD-10-CM | POA: Diagnosis not present

## 2021-03-20 DIAGNOSIS — K219 Gastro-esophageal reflux disease without esophagitis: Secondary | ICD-10-CM | POA: Diagnosis not present

## 2021-03-20 DIAGNOSIS — E785 Hyperlipidemia, unspecified: Secondary | ICD-10-CM | POA: Diagnosis not present

## 2021-03-20 DIAGNOSIS — M1711 Unilateral primary osteoarthritis, right knee: Secondary | ICD-10-CM | POA: Diagnosis not present

## 2021-03-20 DIAGNOSIS — E049 Nontoxic goiter, unspecified: Secondary | ICD-10-CM | POA: Diagnosis not present

## 2021-03-22 ENCOUNTER — Ambulatory Visit (HOSPITAL_COMMUNITY): Payer: Medicare PPO

## 2021-03-22 ENCOUNTER — Ambulatory Visit (HOSPITAL_COMMUNITY)
Admission: RE | Admit: 2021-03-22 | Discharge: 2021-03-22 | Disposition: A | Discharge status: Home / Self Care | Payer: Medicare PPO | Source: Ambulatory Visit | Attending: Orthopedic Surgery | Admitting: Orthopedic Surgery

## 2021-03-22 DIAGNOSIS — M25551 Pain in right hip: Secondary | ICD-10-CM | POA: Diagnosis not present

## 2021-03-22 DIAGNOSIS — S76311A Strain of muscle, fascia and tendon of the posterior muscle group at thigh level, right thigh, initial encounter: Secondary | ICD-10-CM | POA: Diagnosis not present

## 2021-03-22 DIAGNOSIS — M25561 Pain in right knee: Secondary | ICD-10-CM | POA: Insufficient documentation

## 2021-03-22 DIAGNOSIS — S83231A Complex tear of medial meniscus, current injury, right knee, initial encounter: Secondary | ICD-10-CM | POA: Diagnosis not present

## 2021-03-22 DIAGNOSIS — S83271A Complex tear of lateral meniscus, current injury, right knee, initial encounter: Secondary | ICD-10-CM | POA: Diagnosis not present

## 2021-03-25 DIAGNOSIS — I10 Essential (primary) hypertension: Secondary | ICD-10-CM | POA: Diagnosis not present

## 2021-03-25 DIAGNOSIS — E785 Hyperlipidemia, unspecified: Secondary | ICD-10-CM | POA: Diagnosis not present

## 2021-03-25 DIAGNOSIS — I8390 Asymptomatic varicose veins of unspecified lower extremity: Secondary | ICD-10-CM | POA: Diagnosis not present

## 2021-03-25 DIAGNOSIS — E049 Nontoxic goiter, unspecified: Secondary | ICD-10-CM | POA: Diagnosis not present

## 2021-03-25 DIAGNOSIS — M1711 Unilateral primary osteoarthritis, right knee: Secondary | ICD-10-CM | POA: Diagnosis not present

## 2021-03-25 DIAGNOSIS — M19012 Primary osteoarthritis, left shoulder: Secondary | ICD-10-CM | POA: Diagnosis not present

## 2021-03-25 DIAGNOSIS — F419 Anxiety disorder, unspecified: Secondary | ICD-10-CM | POA: Diagnosis not present

## 2021-03-25 DIAGNOSIS — J069 Acute upper respiratory infection, unspecified: Secondary | ICD-10-CM | POA: Diagnosis not present

## 2021-03-25 DIAGNOSIS — K219 Gastro-esophageal reflux disease without esophagitis: Secondary | ICD-10-CM | POA: Diagnosis not present

## 2021-03-31 DIAGNOSIS — M25551 Pain in right hip: Secondary | ICD-10-CM | POA: Diagnosis not present

## 2021-04-02 DIAGNOSIS — K219 Gastro-esophageal reflux disease without esophagitis: Secondary | ICD-10-CM | POA: Diagnosis not present

## 2021-04-02 DIAGNOSIS — I8390 Asymptomatic varicose veins of unspecified lower extremity: Secondary | ICD-10-CM | POA: Diagnosis not present

## 2021-04-02 DIAGNOSIS — J069 Acute upper respiratory infection, unspecified: Secondary | ICD-10-CM | POA: Diagnosis not present

## 2021-04-02 DIAGNOSIS — E785 Hyperlipidemia, unspecified: Secondary | ICD-10-CM | POA: Diagnosis not present

## 2021-04-02 DIAGNOSIS — M1711 Unilateral primary osteoarthritis, right knee: Secondary | ICD-10-CM | POA: Diagnosis not present

## 2021-04-02 DIAGNOSIS — M19012 Primary osteoarthritis, left shoulder: Secondary | ICD-10-CM | POA: Diagnosis not present

## 2021-04-02 DIAGNOSIS — E049 Nontoxic goiter, unspecified: Secondary | ICD-10-CM | POA: Diagnosis not present

## 2021-04-02 DIAGNOSIS — I10 Essential (primary) hypertension: Secondary | ICD-10-CM | POA: Diagnosis not present

## 2021-04-02 DIAGNOSIS — F419 Anxiety disorder, unspecified: Secondary | ICD-10-CM | POA: Diagnosis not present

## 2021-04-09 DIAGNOSIS — M25551 Pain in right hip: Secondary | ICD-10-CM | POA: Diagnosis not present

## 2021-04-13 DIAGNOSIS — Z4509 Encounter for adjustment and management of other cardiac device: Secondary | ICD-10-CM | POA: Diagnosis not present

## 2021-04-13 DIAGNOSIS — Z95818 Presence of other cardiac implants and grafts: Secondary | ICD-10-CM | POA: Diagnosis not present

## 2021-04-13 DIAGNOSIS — R55 Syncope and collapse: Secondary | ICD-10-CM | POA: Diagnosis not present

## 2021-04-28 DIAGNOSIS — M1711 Unilateral primary osteoarthritis, right knee: Secondary | ICD-10-CM | POA: Diagnosis not present

## 2021-04-29 DIAGNOSIS — Z0000 Encounter for general adult medical examination without abnormal findings: Secondary | ICD-10-CM | POA: Diagnosis not present

## 2021-04-29 DIAGNOSIS — R7303 Prediabetes: Secondary | ICD-10-CM | POA: Diagnosis not present

## 2021-04-29 DIAGNOSIS — E039 Hypothyroidism, unspecified: Secondary | ICD-10-CM | POA: Diagnosis not present

## 2021-04-29 DIAGNOSIS — R300 Dysuria: Secondary | ICD-10-CM | POA: Diagnosis not present

## 2021-04-29 DIAGNOSIS — E7801 Familial hypercholesterolemia: Secondary | ICD-10-CM | POA: Diagnosis not present

## 2021-05-06 ENCOUNTER — Ambulatory Visit: Payer: Self-pay | Admitting: Orthopedic Surgery

## 2021-05-06 DIAGNOSIS — G3184 Mild cognitive impairment, so stated: Secondary | ICD-10-CM | POA: Diagnosis not present

## 2021-05-06 DIAGNOSIS — Z01818 Encounter for other preprocedural examination: Secondary | ICD-10-CM | POA: Diagnosis not present

## 2021-05-06 DIAGNOSIS — I480 Paroxysmal atrial fibrillation: Secondary | ICD-10-CM | POA: Diagnosis not present

## 2021-05-06 DIAGNOSIS — F325 Major depressive disorder, single episode, in full remission: Secondary | ICD-10-CM | POA: Diagnosis not present

## 2021-05-06 DIAGNOSIS — R7303 Prediabetes: Secondary | ICD-10-CM | POA: Diagnosis not present

## 2021-05-06 DIAGNOSIS — E039 Hypothyroidism, unspecified: Secondary | ICD-10-CM | POA: Diagnosis not present

## 2021-05-06 DIAGNOSIS — M810 Age-related osteoporosis without current pathological fracture: Secondary | ICD-10-CM | POA: Diagnosis not present

## 2021-05-06 DIAGNOSIS — Z0000 Encounter for general adult medical examination without abnormal findings: Secondary | ICD-10-CM | POA: Diagnosis not present

## 2021-05-06 DIAGNOSIS — M8589 Other specified disorders of bone density and structure, multiple sites: Secondary | ICD-10-CM | POA: Diagnosis not present

## 2021-05-06 DIAGNOSIS — Z23 Encounter for immunization: Secondary | ICD-10-CM | POA: Diagnosis not present

## 2021-05-08 NOTE — Progress Notes (Signed)
04/29/2021: A1c 6.3% Total cholesterol 135, HDL 74, LDL 66, triglycerides 77 TSH 0.95 Hgb 11.7, HCT 35, platelet 220 BUN 26, creatinine 0.66, potassium 3.8, sodium 137, GFR >60

## 2021-05-14 DIAGNOSIS — R55 Syncope and collapse: Secondary | ICD-10-CM | POA: Diagnosis not present

## 2021-05-14 DIAGNOSIS — Z4509 Encounter for adjustment and management of other cardiac device: Secondary | ICD-10-CM | POA: Diagnosis not present

## 2021-05-14 DIAGNOSIS — Z95818 Presence of other cardiac implants and grafts: Secondary | ICD-10-CM | POA: Diagnosis not present

## 2021-05-19 NOTE — Progress Notes (Addendum)
Anesthesia Review:  [id number] Prevost,NP- clerance dated 05/06/21 on chart  Cardiologist : Celeste cardwell, 05/02/21 clearance on chart.  02/05/21 [place]  Neurology- 03/03/21- LOV DR Arlice Colt  Chest x-ray : EKG :02/05/21  Echo : 12/22/20  Stress test:11/23/20  Cardiac Cath :  2018  Activity level:  cannot do a flgiht of stairs without difficulty  Sleep Study/ CPAP : has cpap  Fasting Blood Sugar :      / Checks Blood Sugar -- times a day:   Blood Thinner/ Instructions /Last Dose: ASA / Instructions/ Last Dose :   Eliquis - per husband- last dose of eliquis on 06/01/21.   Covid test on 06/02/21  at 0930am  PT and dtr were unaware of preop appt on 05/23/21.  Medical hx and instructions were completed via phone call with dtr and mother on phone.  Lab and covid test appt set up for 06/02/21 at 0930am .  Bag in lab.   PT to see DR Swinteck on 05/27/21 for preop appt.  Dtr stated they were supposed to receive Eliquis instructions at that time.

## 2021-05-19 NOTE — Progress Notes (Addendum)
Your procedure is scheduled on:    06/05/21.   Report to [facility] Main  Entrance   Report to admitting at   [place] am      Call this number if you have problems the morning of surgery [phone]    REMEMBER: NO  SOLID FOOD CANDY OR GUM AFTER MIDNIGHT. CLEAR LIQUIDS UNTIL   0430am       . NOTHING BY MOUTH EXCEPT CLEAR LIQUIDS UNTIL  0430am   . PLEASE FINISH ENSURE DRINK PER SURGEON ORDER  WHICH NEEDS TO BE COMPLETED AT  0430am  .      CLEAR LIQUID DIET   Foods Allowed                                                                    Coffee and tea, regular and decaf                            Fruit ices (not with fruit pulp)                                      Iced Popsicles                                    Carbonated beverages, regular and diet                                    Cranberry, grape and apple juices Sports drinks like Gatorade Lightly seasoned clear broth or consume(fat free) Sugar, ___________________________________________________________________      BRUSH YOUR TEETH MORNING OF SURGERY AND RINSE YOUR MOUTH OUT, NO CHEWING GUM CANDY OR MINTS.     Take these medicines the morning of surgery with A SIP OF WATER:  cardizem, eye drops as usual, , toprol ( if takes in am ) , protonix, thyroid armour , zoloft  DO NOT TAKE ANY DIABETIC MEDICATIONS DAY OF YOUR SURGERY                               You may not have any metal on your body including hair pins and              piercings  Do not wear jewelry, make-up, lotions, powders or perfumes, deodorant             Do not wear nail polish on your fingernails.  Do not shave  48 hours prior to surgery.              Men may shave face and neck.   Do not bring valuables to the hospital. [place].  Contacts, dentures or bridgework may not be worn into surgery.  Leave suitcase in the car. After surgery it may be brought to your  room.  Patients discharged the day of surgery will not be allowed to drive home. IF YOU ARE HAVING SURGERY AND GOING HOME THE SAME DAY, YOU MUST HAVE AN ADULT TO DRIVE YOU HOME AND BE WITH YOU FOR 24 HOURS. YOU MAY GO HOME BY TAXI OR UBER OR ORTHERWISE, BUT AN ADULT MUST ACCOMPANY YOU HOME AND STAY WITH YOU FOR 24 HOURS.  Name and phone number of your driver:  Special Instructions: N/A              Please read over the following fact sheets you were given: _____________________________________________________________________  [facility] - Preparing for Surgery Before surgery, you can play an important role.  Because skin is not sterile, your skin needs to be as free of germs as possible.  You can reduce the number of germs on your skin by washing with CHG (chlorahexidine gluconate) soap before surgery.  CHG is an antiseptic cleaner which kills germs and bonds with the skin to continue killing germs even after washing. Please DO NOT use if you have an allergy to CHG or antibacterial soaps.  If your skin becomes reddened/irritated stop using the CHG and inform your nurse when you arrive at Short Stay. Do not shave (including legs and underarms) for at least 48 hours prior to the first CHG shower.  You may shave your face/neck. Please follow these instructions carefully:  1.  Shower with CHG Soap the night before surgery and the  morning of Surgery.  2.  If you choose to wash your hair, wash your hair first as usual with your  normal  shampoo.  3.  After you shampoo, rinse your hair and body thoroughly to remove the  shampoo.                           4.  Use CHG as you would any other liquid soap.  You can apply chg directly  to the skin and wash                       Gently with a scrungie or clean washcloth.  5.  Apply the CHG Soap to your body ONLY FROM THE NECK DOWN.   Do not use on face/ open                           Wound or open sores. Avoid contact with eyes, ears mouth and genitals  (private parts).                       Wash face,  Genitals (private parts) with your normal soap.             6.  Wash thoroughly, paying special attention to the area where your surgery  will be performed.  7.  Thoroughly rinse your body with warm water from the neck down.  8.  DO NOT shower/wash with your normal soap after using and rinsing off  the CHG Soap.                9.  Pat yourself dry with a clean towel.            10.  Wear clean pajamas.            11.  Place clean sheets on your bed the night of your first shower and do not  sleep with pets.  Day of Surgery : Do not apply any lotions/deodorants the morning of surgery.  Please wear clean clothes to the hospital/surgery center.  FAILURE TO FOLLOW THESE INSTRUCTIONS MAY RESULT IN THE CANCELLATION OF YOUR SURGERY PATIENT SIGNATURE_________________________________  NURSE SIGNATURE__________________________________  ________________________________________________________________________

## 2021-05-22 DIAGNOSIS — R399 Unspecified symptoms and signs involving the genitourinary system: Secondary | ICD-10-CM | POA: Diagnosis not present

## 2021-05-23 ENCOUNTER — Encounter (HOSPITAL_COMMUNITY)
Admission: RE | Admit: 2021-05-23 | Discharge: 2021-05-23 | Disposition: A | Discharge status: Home / Self Care | Payer: Medicare PPO | Source: Ambulatory Visit | Attending: Orthopedic Surgery | Admitting: Orthopedic Surgery

## 2021-05-23 ENCOUNTER — Encounter (HOSPITAL_COMMUNITY): Payer: Self-pay

## 2021-05-23 ENCOUNTER — Other Ambulatory Visit: Payer: Self-pay

## 2021-05-23 DIAGNOSIS — I480 Paroxysmal atrial fibrillation: Secondary | ICD-10-CM

## 2021-05-23 DIAGNOSIS — Z01818 Encounter for other preprocedural examination: Secondary | ICD-10-CM

## 2021-05-23 HISTORY — DX: Pneumonia, unspecified organism: J18.9

## 2021-05-23 HISTORY — DX: Cardiac arrhythmia, unspecified: I49.9

## 2021-05-27 ENCOUNTER — Ambulatory Visit: Payer: Self-pay | Admitting: Orthopedic Surgery

## 2021-05-27 NOTE — H&P (Signed)
TOTAL KNEE ADMISSION H&P  Patient is being admitted for right total knee arthroplasty.  Subjective:  Chief Complaint:right knee pain.  HPI: Cynthia [name], 81 y.o. female, has a history of pain and functional disability in the right knee due to arthritis and has failed non-surgical conservative treatments for greater than 12 weeks to includeNSAID's and/or analgesics, corticosteriod injections, viscosupplementation injections, flexibility and strengthening excercises, use of assistive devices, weight reduction as appropriate, and activity modification.  Onset of symptoms was gradual, starting >10 years ago with gradually worsening course since that time. The patient noted no past surgery on the right knee(s).  Patient currently rates pain in the right knee(s) at 10 out of 10 with activity. Patient has night pain, worsening of pain with activity and weight bearing, pain that interferes with activities of daily living, pain with passive range of motion, crepitus, and joint swelling.  Patient has evidence of subchondral cysts, subchondral sclerosis, periarticular osteophytes, and joint space narrowing by imaging studies. There is no active infection.  Patient Active Problem List   Diagnosis Date Noted   NPH (normal pressure hydrocephalus) ([place]) 08/19/2020   Urinary urgency 08/19/2020   Encounter for loop recorder check 06/09/2020   Loop recorder Biotronik Loop Recorder 05/23/2020 05/23/2020   Paroxysmal A-fib ([place]) 03/24/2020   Atrial fibrillation with RVR ([place]) 03/20/2020   Gait disturbance 01/29/2020   Periodic limb movement disorder 08/30/2019   Nocturia 07/01/2018   Midline cystocele 06/08/2018   Recurrent UTI 06/08/2018   Vaginal vault prolapse 06/08/2018   Lightheadedness 06/02/2018   Syncope and collapse    Hypotension 03/17/2018   Postural dizziness with presyncope 03/17/2018   PAF (paroxysmal atrial fibrillation) ([place]) 03/17/2018   Memory loss 11/25/2017   Acute lower UTI  10/29/2017   Abnormal stress test    Chest pain 11/23/2016   Hyperlipidemia 11/23/2016   GERD (gastroesophageal reflux disease) 11/23/2016   History of recurrent UTIs 11/23/2016   Polyneuropathy 10/30/2016   Bursitis, subacromial 10/30/2016   Numbness 10/30/2016   Arthritis, senescent 01/03/2016   Left knee pain 01/03/2016   Pain in joint, ankle and foot 02/28/2015   Dry mouth 08/07/2014   Dry eyes 08/07/2014   Other headache syndrome 06/25/2014   Sinusitis, chronic 06/25/2014   Neck pain 06/25/2014   OSA on CPAP 06/25/2014   Essential hypertension, benign 06/25/2014   Past Medical History:  Diagnosis Date   Acute GI bleeding 10/27/2017   Arthritis    "joints" (11/10/2017)   Dizziness    Dysrhythmia    afib   Encounter for loop recorder check 06/09/2020   GERD (gastroesophageal reflux disease)    GI bleed 11/06/2017   Hearing loss    History of blood transfusion 10/2017   Hyperlipidemia    Hypertension    Hyperthyroidism    Loop recorder Biotronik Loop Recorder 05/23/2020 05/23/2020   Melanoma ([place])    "cut off my back"   Memory loss    Pneumonia    Sleep apnea    cpap   Syncope and collapse     Past Surgical History:  Procedure Laterality Date   ABDOMINAL HYSTERECTOMY     "partial"   APPENDECTOMY     BIOPSY  11/17/2017   Procedure: BIOPSY;  Surgeon: Clarene Essex, MD;  Location: [facility];  Service: Endoscopy;;   COLONOSCOPY WITH PROPOFOL N/A 11/17/2017   Procedure: COLONOSCOPY WITH PROPOFOL;  Surgeon: Clarene Essex, MD;  Location: [facility];  Service: Endoscopy;  Laterality: N/A;   CRANIECTOMY FOR EXCISION OF  ACOUSTIC NEUROMA     ESOPHAGOGASTRODUODENOSCOPY (EGD) WITH PROPOFOL N/A 10/28/2017   Procedure: ESOPHAGOGASTRODUODENOSCOPY (EGD) WITH PROPOFOL;  Surgeon: Clarene Essex, MD;  Location: [facility];  Service: Endoscopy;  Laterality: N/A;   FOOT SURGERY Bilateral 04/18/2020   KNEE ARTHROSCOPY Left    LEFT HEART CATH AND CORONARY ANGIOGRAPHY N/A 11/24/2016    Procedure: LEFT HEART CATH AND CORONARY ANGIOGRAPHY;  Surgeon: Jettie Booze, MD;  Location: [facility];  Service: Cardiovascular;  Laterality: N/A;   MELANOMA EXCISION     "off my back"   NASAL SINUS SURGERY     TONSILLECTOMY      Current Outpatient Medications  Medication Sig Dispense Refill Last Dose   acetaminophen (TYLENOL) 500 MG tablet Take 1,000 mg by mouth at bedtime.      Alpha-Lipoic Acid (LIPOIC ACID PO) Take 600 mg by mouth daily.      apixaban (ELIQUIS) 5 MG TABS tablet Take 1 tablet (5 mg total) by mouth 2 (two) times daily. With breakfast and dinner 60 tablet 5    atorvastatin (LIPITOR) 10 MG tablet TAKE 1 TABLET BY MOUTH EVERY DAY 90 tablet 3    AZO-CRANBERRY PO Take 1 tablet by mouth daily.      Calcium Carbonate-Vitamin D3 600-400 MG-UNIT TABS Take 1 tablet by mouth daily.      cephALEXin (KEFLEX) 500 MG capsule Take 500 mg by mouth daily.      cycloSPORINE (RESTASIS) 0.05 % ophthalmic emulsion Place 1 drop into both eyes daily.      diltiazem (CARDIZEM CD) 120 MG 24 hr capsule TAKE 1 CAPSULE BY MOUTH EVERY DAY 90 capsule 1    fluticasone (FLONASE) 50 MCG/ACT nasal spray Place 1 spray into the nose at bedtime.      hydrochlorothiazide (HYDRODIURIL) 12.5 MG tablet Take 1 tablet (12.5 mg total) by mouth in the morning. 90 tablet 3    losartan (COZAAR) 100 MG tablet Take 100 mg by mouth every evening.      meclizine (ANTIVERT) 25 MG tablet Take 25 mg by mouth daily as needed for dizziness.      Melatonin 5 MG CAPS Take 5 mg by mouth at bedtime.       metoprolol succinate (TOPROL-XL) 25 MG 24 hr tablet TAKE 1 TABLET BY MOUTH DAILY WITH OR IMMEDIATELY FOLLOWING A MEAL 90 tablet 1    Multiple Vitamin (MULTIVITAMIN WITH MINERALS) TABS tablet Take 1-2 tablets by mouth daily. flintstone vitamins Chewable with Iron      Multiple Vitamins-Minerals (MULTIVITAMIN WITH MINERALS) tablet Take 1 tablet by mouth daily. Centrum      nitroGLYCERIN (NITROLINGUAL) 0.4 MG/SPRAY  spray Place 1 spray under the tongue every 5 (five) minutes x 3 doses as needed for chest pain. 12 g 1    Omega-3 1000 MG CAPS Take 2,000 mg by mouth daily.       pantoprazole (PROTONIX) 40 MG tablet Take 40 mg by mouth daily before breakfast.   1    sertraline (ZOLOFT) 25 MG tablet Take 25 mg by mouth daily.      thyroid (ARMOUR) 60 MG tablet Take 60 mg by mouth daily before breakfast.      traMADol (ULTRAM) 50 MG tablet Take 50 mg by mouth at bedtime.      vitamin C (ASCORBIC ACID) 500 MG tablet Take 500 mg by mouth daily.       No current facility-administered medications for this visit.   Allergies  Allergen Reactions   Flecainide Other (  See Comments)    Severe dizziness   Avelox [Moxifloxacin Hcl In Nacl] Other (See Comments)    Unknown reaction   Ciprofloxacin     Other reaction(s): multiple medication interactions   Other Other (See Comments)    Avelox+cymbalta Together Other reaction(s): flu like symptoms   Oxycodone Other (See Comments)    uphoria   Trimethoprim Other (See Comments)    Other reaction(s): hives   Alendronate Sodium Nausea Only    Social History   Tobacco Use   Smoking status: Former    Years: 2.00    Types: Cigarettes    Quit date: 1980    Years since quitting: 43.1   Smokeless tobacco: Never   Tobacco comments:    11/10/2017 "only smoked when I was on my period"  Substance Use Topics   Alcohol use: Yes    Comment: wine on occas    Family History  Problem Relation Age of Onset   Congestive Heart Failure Mother    Heart disease Father        History of heart attacks at a later age.     Heart disease Brother    Hearing loss Brother      Review of Systems  Musculoskeletal:  Positive for arthralgias, back pain, gait problem and joint swelling.  All other systems reviewed and are negative.  Objective:  Physical Exam Constitutional:      Appearance: Normal appearance.  HENT:     Head: Normocephalic and atraumatic.     Nose: Nose normal.      Mouth/Throat:     Mouth: Mucous membranes are moist.     Pharynx: Oropharynx is clear.  Eyes:     Extraocular Movements: Extraocular movements intact.     Conjunctiva/sclera: Conjunctivae normal.     Pupils: Pupils are equal, round, and reactive to light.  Cardiovascular:     Rate and Rhythm: Normal rate and regular rhythm.     Pulses: Normal pulses.  Pulmonary:     Effort: Pulmonary effort is normal. No respiratory distress.  Abdominal:     General: Abdomen is flat. There is no distension.     Palpations: Abdomen is soft.  Genitourinary:    Comments: deferred Musculoskeletal:     Cervical back: Normal range of motion and neck supple.     Right knee: Swelling, deformity, effusion, bony tenderness and crepitus present. Decreased range of motion. Abnormal alignment.  Skin:    General: Skin is warm and dry.     Capillary Refill: Capillary refill takes less than 2 seconds.  Neurological:     General: No focal deficit present.     Mental Status: She is alert and oriented to person, place, and time. Mental status is at baseline.  Psychiatric:        Mood and Affect: Mood normal.        Behavior: Behavior normal.        Thought Content: Thought content normal.        Judgment: Judgment normal.    Vital signs in last 24 hours: @VSRANGES @  Labs:   Estimated body mass index is 25.29 kg/m as calculated from the following:   Height as of 03/03/21: 5\' 5"  (1.651 m).   Weight as of 03/03/21: 68.9 kg.   Imaging Review Plain radiographs demonstrate severe degenerative joint disease of the right knee(s). The overall alignment issignificant valgus. The bone quality appears to be adequate for age and reported activity level.      Assessment/Plan:  End stage arthritis, right knee   The patient history, physical examination, clinical judgment of the provider and imaging studies are consistent with end stage degenerative joint disease of the right knee(s) and total knee  arthroplasty is deemed medically necessary. The treatment options including medical management, injection therapy arthroscopy and arthroplasty were discussed at length. The risks and benefits of total knee arthroplasty were presented and reviewed. The risks due to aseptic loosening, infection, stiffness, patella tracking problems, thromboembolic complications and other imponderables were discussed. The patient acknowledged the explanation, agreed to proceed with the plan and consent was signed. Patient is being admitted for inpatient treatment for surgery, pain control, PT, OT, prophylactic antibiotics, VTE prophylaxis, progressive ambulation and ADL's and discharge planning. The patient is planning to be discharged  home with OPPT.     Patient's anticipated LOS is less than 2 midnights, meeting these requirements: - Younger than 75 - Lives within 1 hour of care - Has a competent adult at home to recover with post-op recover - NO history of  - Chronic pain requiring opiods  - Diabetes  - Coronary Artery Disease  - Heart failure  - Heart attack  - Stroke  - DVT/VTE  - Cardiac arrhythmia  - Respiratory Failure/COPD  - Renal failure  - Anemia  - Advanced Liver disease

## 2021-05-27 NOTE — H&P (View-Only) (Signed)
TOTAL KNEE ADMISSION H&P  Patient is being admitted for right total knee arthroplasty.  Subjective:  Chief Complaint:right knee pain.  HPI: Cynthia [name], 81 y.o. female, has a history of pain and functional disability in the right knee due to arthritis and has failed non-surgical conservative treatments for greater than 12 weeks to includeNSAID's and/or analgesics, corticosteriod injections, viscosupplementation injections, flexibility and strengthening excercises, use of assistive devices, weight reduction as appropriate, and activity modification.  Onset of symptoms was gradual, starting >10 years ago with gradually worsening course since that time. The patient noted no past surgery on the right knee(s).  Patient currently rates pain in the right knee(s) at 10 out of 10 with activity. Patient has night pain, worsening of pain with activity and weight bearing, pain that interferes with activities of daily living, pain with passive range of motion, crepitus, and joint swelling.  Patient has evidence of subchondral cysts, subchondral sclerosis, periarticular osteophytes, and joint space narrowing by imaging studies. There is no active infection.  Patient Active Problem List   Diagnosis Date Noted   NPH (normal pressure hydrocephalus) ([place]) 08/19/2020   Urinary urgency 08/19/2020   Encounter for loop recorder check 06/09/2020   Loop recorder Biotronik Loop Recorder 05/23/2020 05/23/2020   Paroxysmal A-fib ([place]) 03/24/2020   Atrial fibrillation with RVR ([place]) 03/20/2020   Gait disturbance 01/29/2020   Periodic limb movement disorder 08/30/2019   Nocturia 07/01/2018   Midline cystocele 06/08/2018   Recurrent UTI 06/08/2018   Vaginal vault prolapse 06/08/2018   Lightheadedness 06/02/2018   Syncope and collapse    Hypotension 03/17/2018   Postural dizziness with presyncope 03/17/2018   PAF (paroxysmal atrial fibrillation) ([place]) 03/17/2018   Memory loss 11/25/2017   Acute lower UTI  10/29/2017   Abnormal stress test    Chest pain 11/23/2016   Hyperlipidemia 11/23/2016   GERD (gastroesophageal reflux disease) 11/23/2016   History of recurrent UTIs 11/23/2016   Polyneuropathy 10/30/2016   Bursitis, subacromial 10/30/2016   Numbness 10/30/2016   Arthritis, senescent 01/03/2016   Left knee pain 01/03/2016   Pain in joint, ankle and foot 02/28/2015   Dry mouth 08/07/2014   Dry eyes 08/07/2014   Other headache syndrome 06/25/2014   Sinusitis, chronic 06/25/2014   Neck pain 06/25/2014   OSA on CPAP 06/25/2014   Essential hypertension, benign 06/25/2014   Past Medical History:  Diagnosis Date   Acute GI bleeding 10/27/2017   Arthritis    "joints" (11/10/2017)   Dizziness    Dysrhythmia    afib   Encounter for loop recorder check 06/09/2020   GERD (gastroesophageal reflux disease)    GI bleed 11/06/2017   Hearing loss    History of blood transfusion 10/2017   Hyperlipidemia    Hypertension    Hyperthyroidism    Loop recorder Biotronik Loop Recorder 05/23/2020 05/23/2020   Melanoma ([place])    "cut off my back"   Memory loss    Pneumonia    Sleep apnea    cpap   Syncope and collapse     Past Surgical History:  Procedure Laterality Date   ABDOMINAL HYSTERECTOMY     "partial"   APPENDECTOMY     BIOPSY  11/17/2017   Procedure: BIOPSY;  Surgeon: Clarene Essex, MD;  [facility]: [facility];  Service: Endoscopy;;   COLONOSCOPY WITH PROPOFOL N/A 11/17/2017   Procedure: COLONOSCOPY WITH PROPOFOL;  Surgeon: Clarene Essex, MD;  Location: [facility];  Service: Endoscopy;  Laterality: N/A;   CRANIECTOMY FOR EXCISION OF  ACOUSTIC NEUROMA     ESOPHAGOGASTRODUODENOSCOPY (EGD) WITH PROPOFOL N/A 10/28/2017   Procedure: ESOPHAGOGASTRODUODENOSCOPY (EGD) WITH PROPOFOL;  Surgeon: Clarene Essex, MD;  [facility];  Service: Endoscopy;  Laterality: N/A;   FOOT SURGERY Bilateral 04/18/2020   KNEE ARTHROSCOPY Left    LEFT HEART CATH AND CORONARY ANGIOGRAPHY N/A 11/24/2016    Procedure: LEFT HEART CATH AND CORONARY ANGIOGRAPHY;  Surgeon: Jettie Booze, MD;  Location: [facility];  Service: Cardiovascular;  Laterality: N/A;   MELANOMA EXCISION     "off my back"   NASAL SINUS SURGERY     TONSILLECTOMY      Current Outpatient Medications  Medication Sig Dispense Refill Last Dose   acetaminophen (TYLENOL) 500 MG tablet Take 1,000 mg by mouth at bedtime.      Alpha-Lipoic Acid (LIPOIC ACID PO) Take 600 mg by mouth daily.      apixaban (ELIQUIS) 5 MG TABS tablet Take 1 tablet (5 mg total) by mouth 2 (two) times daily. With breakfast and dinner 60 tablet 5    atorvastatin (LIPITOR) 10 MG tablet TAKE 1 TABLET BY MOUTH EVERY DAY 90 tablet 3    AZO-CRANBERRY PO Take 1 tablet by mouth daily.      Calcium Carbonate-Vitamin D3 600-400 MG-UNIT TABS Take 1 tablet by mouth daily.      cephALEXin (KEFLEX) 500 MG capsule Take 500 mg by mouth daily.      cycloSPORINE (RESTASIS) 0.05 % ophthalmic emulsion Place 1 drop into both eyes daily.      diltiazem (CARDIZEM CD) 120 MG 24 hr capsule TAKE 1 CAPSULE BY MOUTH EVERY DAY 90 capsule 1    fluticasone (FLONASE) 50 MCG/ACT nasal spray Place 1 spray into the nose at bedtime.      hydrochlorothiazide (HYDRODIURIL) 12.5 MG tablet Take 1 tablet (12.5 mg total) by mouth in the morning. 90 tablet 3    losartan (COZAAR) 100 MG tablet Take 100 mg by mouth every evening.      meclizine (ANTIVERT) 25 MG tablet Take 25 mg by mouth daily as needed for dizziness.      Melatonin 5 MG CAPS Take 5 mg by mouth at bedtime.       metoprolol succinate (TOPROL-XL) 25 MG 24 hr tablet TAKE 1 TABLET BY MOUTH DAILY WITH OR IMMEDIATELY FOLLOWING A MEAL 90 tablet 1    Multiple Vitamin (MULTIVITAMIN WITH MINERALS) TABS tablet Take 1-2 tablets by mouth daily. flintstone vitamins Chewable with Iron      Multiple Vitamins-Minerals (MULTIVITAMIN WITH MINERALS) tablet Take 1 tablet by mouth daily. Centrum      nitroGLYCERIN (NITROLINGUAL) 0.4 MG/SPRAY  spray Place 1 spray under the tongue every 5 (five) minutes x 3 doses as needed for chest pain. 12 g 1    Omega-3 1000 MG CAPS Take 2,000 mg by mouth daily.       pantoprazole (PROTONIX) 40 MG tablet Take 40 mg by mouth daily before breakfast.   1    sertraline (ZOLOFT) 25 MG tablet Take 25 mg by mouth daily.      thyroid (ARMOUR) 60 MG tablet Take 60 mg by mouth daily before breakfast.      traMADol (ULTRAM) 50 MG tablet Take 50 mg by mouth at bedtime.      vitamin C (ASCORBIC ACID) 500 MG tablet Take 500 mg by mouth daily.       No current facility-administered medications for this visit.   Allergies  Allergen Reactions   Flecainide Other (  See Comments)    Severe dizziness   Avelox [Moxifloxacin Hcl In Nacl] Other (See Comments)    Unknown reaction   Ciprofloxacin     Other reaction(s): multiple medication interactions   Other Other (See Comments)    Avelox+cymbalta Together Other reaction(s): flu like symptoms   Oxycodone Other (See Comments)    uphoria   Trimethoprim Other (See Comments)    Other reaction(s): hives   Alendronate Sodium Nausea Only    Social History   Tobacco Use   Smoking status: Former    Years: 2.00    Types: Cigarettes    Quit date: 1980    Years since quitting: 43.1   Smokeless tobacco: Never   Tobacco comments:    11/10/2017 "only smoked when I was on my period"  Substance Use Topics   Alcohol use: Yes    Comment: wine on occas    Family History  Problem Relation Age of Onset   Congestive Heart Failure Mother    Heart disease Father        History of heart attacks at a later age.     Heart disease Brother    Hearing loss Brother      Review of Systems  Musculoskeletal:  Positive for arthralgias, back pain, gait problem and joint swelling.  All other systems reviewed and are negative.  Objective:  Physical Exam Constitutional:      Appearance: Normal appearance.  HENT:     Head: Normocephalic and atraumatic.     Nose: Nose normal.      Mouth/Throat:     Mouth: Mucous membranes are moist.     Pharynx: Oropharynx is clear.  Eyes:     Extraocular Movements: Extraocular movements intact.     Conjunctiva/sclera: Conjunctivae normal.     Pupils: Pupils are equal, round, and reactive to light.  Cardiovascular:     Rate and Rhythm: Normal rate and regular rhythm.     Pulses: Normal pulses.  Pulmonary:     Effort: Pulmonary effort is normal. No respiratory distress.  Abdominal:     General: Abdomen is flat. There is no distension.     Palpations: Abdomen is soft.  Genitourinary:    Comments: deferred Musculoskeletal:     Cervical back: Normal range of motion and neck supple.     Right knee: Swelling, deformity, effusion, bony tenderness and crepitus present. Decreased range of motion. Abnormal alignment.  Skin:    General: Skin is warm and dry.     Capillary Refill: Capillary refill takes less than 2 seconds.  Neurological:     General: No focal deficit present.     Mental Status: She is alert and oriented to person, place, and time. Mental status is at baseline.  Psychiatric:        Mood and Affect: Mood normal.        Behavior: Behavior normal.        Thought Content: Thought content normal.        Judgment: Judgment normal.    Vital signs in last 24 hours: @VSRANGES @  Labs:   Estimated body mass index is 25.29 kg/m as calculated from the following:   Height as of 03/03/21: 5\' 5"  (1.651 m).   Weight as of 03/03/21: 68.9 kg.   Imaging Review Plain radiographs demonstrate severe degenerative joint disease of the right knee(s). The overall alignment issignificant valgus. The bone quality appears to be adequate for age and reported activity level.      Assessment/Plan:  End stage arthritis, right knee   The patient history, physical examination, clinical judgment of the provider and imaging studies are consistent with end stage degenerative joint disease of the right knee(s) and total knee  arthroplasty is deemed medically necessary. The treatment options including medical management, injection therapy arthroscopy and arthroplasty were discussed at length. The risks and benefits of total knee arthroplasty were presented and reviewed. The risks due to aseptic loosening, infection, stiffness, patella tracking problems, thromboembolic complications and other imponderables were discussed. The patient acknowledged the explanation, agreed to proceed with the plan and consent was signed. Patient is being admitted for inpatient treatment for surgery, pain control, PT, OT, prophylactic antibiotics, VTE prophylaxis, progressive ambulation and ADL's and discharge planning. The patient is planning to be discharged  home with OPPT.     Patient's anticipated LOS is less than 2 midnights, meeting these requirements: - Younger than 70 - Lives within 1 hour of care - Has a competent adult at home to recover with post-op recover - NO history of  - Chronic pain requiring opiods  - Diabetes  - Coronary Artery Disease  - Heart failure  - Heart attack  - Stroke  - DVT/VTE  - Cardiac arrhythmia  - Respiratory Failure/COPD  - Renal failure  - Anemia  - Advanced Liver disease

## 2021-05-29 DIAGNOSIS — N390 Urinary tract infection, site not specified: Secondary | ICD-10-CM | POA: Diagnosis not present

## 2021-06-02 ENCOUNTER — Encounter (HOSPITAL_COMMUNITY)
Admission: RE | Admit: 2021-06-02 | Discharge: 2021-06-02 | Disposition: A | Discharge status: Home / Self Care | Payer: Medicare PPO | Source: Ambulatory Visit | Attending: Orthopedic Surgery | Admitting: Orthopedic Surgery

## 2021-06-02 ENCOUNTER — Other Ambulatory Visit (HOSPITAL_COMMUNITY): Payer: Medicare PPO

## 2021-06-02 ENCOUNTER — Other Ambulatory Visit: Payer: Self-pay

## 2021-06-02 DIAGNOSIS — Z01812 Encounter for preprocedural laboratory examination: Secondary | ICD-10-CM | POA: Insufficient documentation

## 2021-06-02 DIAGNOSIS — I480 Paroxysmal atrial fibrillation: Secondary | ICD-10-CM | POA: Diagnosis not present

## 2021-06-02 DIAGNOSIS — Z01818 Encounter for other preprocedural examination: Secondary | ICD-10-CM

## 2021-06-02 DIAGNOSIS — Z20822 Contact with and (suspected) exposure to covid-19: Secondary | ICD-10-CM | POA: Diagnosis not present

## 2021-06-02 LAB — CBC
HCT: 35.9 % — ABNORMAL LOW (ref 36.0–46.0)
Hemoglobin: 11.9 g/dL — ABNORMAL LOW (ref 12.0–15.0)
MCH: 31.6 pg (ref 26.0–34.0)
MCHC: 33.1 g/dL (ref 30.0–36.0)
MCV: 95.2 fL (ref 80.0–100.0)
Platelets: 196 10*3/uL (ref 150–400)
RBC: 3.77 MIL/uL — ABNORMAL LOW (ref 3.87–5.11)
RDW: 12.4 % (ref 11.5–15.5)
WBC: 6.3 10*3/uL (ref 4.0–10.5)
nRBC: 0 % (ref 0.0–0.2)

## 2021-06-02 LAB — BASIC METABOLIC PANEL
Anion gap: 6 (ref 5–15)
BUN: 25 mg/dL — ABNORMAL HIGH (ref 8–23)
CO2: 28 mmol/L (ref 22–32)
Calcium: 9.1 mg/dL (ref 8.9–10.3)
Chloride: 100 mmol/L (ref 98–111)
Creatinine, Ser: 0.75 mg/dL (ref 0.44–1.00)
GFR, Estimated: >60 mL/min (ref >60)
Glucose, Bld: 90 mg/dL (ref 70–99)
Potassium: 3.8 mmol/L (ref 3.5–5.1)
Sodium: 134 mmol/L — ABNORMAL LOW (ref 135–145)

## 2021-06-02 LAB — SURGICAL PCR SCREEN
MRSA, PCR: NEGATIVE
Staphylococcus aureus: NEGATIVE

## 2021-06-02 LAB — SARS CORONAVIRUS 2 (TAT 6-24 HRS): SARS Coronavirus 2: NEGATIVE

## 2021-06-03 ENCOUNTER — Encounter (HOSPITAL_COMMUNITY): Payer: Medicare PPO

## 2021-06-04 NOTE — Anesthesia Preprocedure Evaluation (Addendum)
Anesthesia Evaluation  Patient identified by MRN, date of birth, ID band Patient awake    Reviewed: Allergy & Precautions, NPO status , Patient's Chart, lab work & pertinent test results, reviewed documented beta blocker date and time   Airway Mallampati: I       Dental no notable dental hx.    Pulmonary former smoker,    Pulmonary exam normal        Cardiovascular hypertension, Pt. on medications and Pt. on home beta blockers Normal cardiovascular exam     Neuro/Psych negative psych ROS   GI/Hepatic GERD  Medicated,  Endo/Other  Hyperthyroidism   Renal/GU   negative genitourinary   Musculoskeletal   Abdominal Normal abdominal exam  (+)   Peds  Hematology  (+) Blood dyscrasia, anemia ,   Anesthesia Other Findings Alethia Berthold, PA-C  12/23/2020 10:02 AM EDT   Echocardiogram unchanged compared to previous in 2019.    Study Result  Echocardiogram 12/19/2020:  Normal LV systolic function with visual EF 60-65%. Left ventricle cavity  is normal in size. Mild left ventricular hypertrophy. Normal global wall  motion. Normal diastolic filling pattern, normal LAP.  Mild (Grade I) mitral regurgitation. Mild calcification of the mitral  valve annulus.  Mild tricuspid regurgitation. No evidence of pulmonary hypertension.  Compared to study 11/09/2017 no significant change  C Cantwell, PA-C  11/25/2020 12:35 PM EDT   Low risk stress test. Will discuss further at upcoming office visit.        Reproductive/Obstetrics                          Anesthesia Physical Anesthesia Plan  ASA: 3  Anesthesia Plan: Spinal   Post-op Pain Management: Regional block*   Induction:   PONV Risk Score and Plan: Ondansetron, Dexamethasone and Midazolam  Airway Management Planned: LMA  Additional Equipment: None  Intra-op Plan:   Post-operative Plan:   Informed Consent: I have reviewed the  patients History and Physical, chart, labs and discussed the procedure including the risks, benefits and alternatives for the proposed anesthesia with the patient or authorized representative who has indicated his/her understanding and acceptance.       Plan Discussed with:   Anesthesia Plan Comments:        Anesthesia Quick Evaluation

## 2021-06-05 ENCOUNTER — Ambulatory Visit (HOSPITAL_COMMUNITY): Payer: Medicare PPO

## 2021-06-05 ENCOUNTER — Inpatient Hospital Stay (HOSPITAL_COMMUNITY)
Admission: AD | Admit: 2021-06-05 | Discharge: 2021-06-10 | DRG: 470 | Disposition: A | Discharge status: Home / Self Care | Payer: Medicare PPO | Attending: Orthopedic Surgery | Admitting: Orthopedic Surgery

## 2021-06-05 ENCOUNTER — Ambulatory Visit (HOSPITAL_BASED_OUTPATIENT_CLINIC_OR_DEPARTMENT_OTHER): Payer: Medicare PPO | Admitting: Anesthesiology

## 2021-06-05 ENCOUNTER — Ambulatory Visit (HOSPITAL_COMMUNITY): Payer: Medicare PPO | Admitting: Physician Assistant

## 2021-06-05 ENCOUNTER — Other Ambulatory Visit: Payer: Self-pay

## 2021-06-05 ENCOUNTER — Encounter (HOSPITAL_COMMUNITY): Payer: Self-pay | Admitting: Orthopedic Surgery

## 2021-06-05 ENCOUNTER — Encounter (HOSPITAL_COMMUNITY)
Admission: AD | Disposition: A | Discharge status: Home / Self Care | Payer: Self-pay | Source: Home / Self Care | Attending: Orthopedic Surgery

## 2021-06-05 DIAGNOSIS — Z8701 Personal history of pneumonia (recurrent): Secondary | ICD-10-CM

## 2021-06-05 DIAGNOSIS — D649 Anemia, unspecified: Secondary | ICD-10-CM

## 2021-06-05 DIAGNOSIS — G629 Polyneuropathy, unspecified: Secondary | ICD-10-CM | POA: Diagnosis present

## 2021-06-05 DIAGNOSIS — Z87891 Personal history of nicotine dependence: Secondary | ICD-10-CM

## 2021-06-05 DIAGNOSIS — T40605A Adverse effect of unspecified narcotics, initial encounter: Secondary | ICD-10-CM | POA: Diagnosis not present

## 2021-06-05 DIAGNOSIS — Z85820 Personal history of malignant melanoma of skin: Secondary | ICD-10-CM

## 2021-06-05 DIAGNOSIS — Z95818 Presence of other cardiac implants and grafts: Secondary | ICD-10-CM

## 2021-06-05 DIAGNOSIS — Z79899 Other long term (current) drug therapy: Secondary | ICD-10-CM | POA: Diagnosis not present

## 2021-06-05 DIAGNOSIS — Z7901 Long term (current) use of anticoagulants: Secondary | ICD-10-CM

## 2021-06-05 DIAGNOSIS — G8918 Other acute postprocedural pain: Secondary | ICD-10-CM | POA: Diagnosis not present

## 2021-06-05 DIAGNOSIS — I495 Sick sinus syndrome: Secondary | ICD-10-CM | POA: Diagnosis present

## 2021-06-05 DIAGNOSIS — I10 Essential (primary) hypertension: Secondary | ICD-10-CM | POA: Diagnosis present

## 2021-06-05 DIAGNOSIS — I484 Atypical atrial flutter: Secondary | ICD-10-CM | POA: Diagnosis present

## 2021-06-05 DIAGNOSIS — M21061 Valgus deformity, not elsewhere classified, right knee: Secondary | ICD-10-CM | POA: Diagnosis not present

## 2021-06-05 DIAGNOSIS — M1711 Unilateral primary osteoarthritis, right knee: Secondary | ICD-10-CM | POA: Diagnosis present

## 2021-06-05 DIAGNOSIS — K219 Gastro-esophageal reflux disease without esophagitis: Secondary | ICD-10-CM | POA: Diagnosis present

## 2021-06-05 DIAGNOSIS — G4733 Obstructive sleep apnea (adult) (pediatric): Secondary | ICD-10-CM | POA: Diagnosis present

## 2021-06-05 DIAGNOSIS — Y92230 Patient room in hospital as the place of occurrence of the external cause: Secondary | ICD-10-CM | POA: Diagnosis not present

## 2021-06-05 DIAGNOSIS — R112 Nausea with vomiting, unspecified: Secondary | ICD-10-CM | POA: Diagnosis not present

## 2021-06-05 DIAGNOSIS — I4819 Other persistent atrial fibrillation: Secondary | ICD-10-CM | POA: Diagnosis present

## 2021-06-05 DIAGNOSIS — Z79890 Hormone replacement therapy (postmenopausal): Secondary | ICD-10-CM

## 2021-06-05 DIAGNOSIS — Z87440 Personal history of urinary (tract) infections: Secondary | ICD-10-CM | POA: Diagnosis not present

## 2021-06-05 DIAGNOSIS — Z90710 Acquired absence of both cervix and uterus: Secondary | ICD-10-CM

## 2021-06-05 DIAGNOSIS — E0590 Thyrotoxicosis, unspecified without thyrotoxic crisis or storm: Secondary | ICD-10-CM | POA: Diagnosis present

## 2021-06-05 DIAGNOSIS — I480 Paroxysmal atrial fibrillation: Secondary | ICD-10-CM | POA: Diagnosis not present

## 2021-06-05 DIAGNOSIS — I4589 Other specified conduction disorders: Secondary | ICD-10-CM | POA: Diagnosis present

## 2021-06-05 DIAGNOSIS — I4892 Unspecified atrial flutter: Secondary | ICD-10-CM | POA: Diagnosis not present

## 2021-06-05 DIAGNOSIS — I440 Atrioventricular block, first degree: Secondary | ICD-10-CM | POA: Diagnosis not present

## 2021-06-05 DIAGNOSIS — Z471 Aftercare following joint replacement surgery: Secondary | ICD-10-CM | POA: Diagnosis not present

## 2021-06-05 DIAGNOSIS — Z96651 Presence of right artificial knee joint: Secondary | ICD-10-CM | POA: Diagnosis not present

## 2021-06-05 DIAGNOSIS — H9190 Unspecified hearing loss, unspecified ear: Secondary | ICD-10-CM | POA: Diagnosis present

## 2021-06-05 DIAGNOSIS — Z8249 Family history of ischemic heart disease and other diseases of the circulatory system: Secondary | ICD-10-CM

## 2021-06-05 DIAGNOSIS — M25461 Effusion, right knee: Secondary | ICD-10-CM | POA: Diagnosis not present

## 2021-06-05 DIAGNOSIS — E785 Hyperlipidemia, unspecified: Secondary | ICD-10-CM | POA: Diagnosis present

## 2021-06-05 HISTORY — PX: KNEE ARTHROPLASTY: SHX992

## 2021-06-05 SURGERY — ARTHROPLASTY, KNEE, TOTAL, USING IMAGELESS COMPUTER-ASSISTED NAVIGATION
Anesthesia: Spinal | Site: Knee | Laterality: Right

## 2021-06-05 MED ORDER — ISOPROPYL ALCOHOL 70 % SOLN
Status: DC | PRN
  Administered 2021-06-05: 1 via TOPICAL

## 2021-06-05 MED ORDER — PROPOFOL 1000 MG/100ML IV EMUL
INTRAVENOUS | Status: AC
  Filled 2021-06-05: qty 100

## 2021-06-05 MED ORDER — PROPOFOL 10 MG/ML IV BOLUS
INTRAVENOUS | Status: AC
  Filled 2021-06-05: qty 20

## 2021-06-05 MED ORDER — PANTOPRAZOLE SODIUM 40 MG PO TBEC
40.0000 mg | DELAYED_RELEASE_TABLET | Freq: Every day | ORAL | Status: DC
  Administered 2021-06-06 – 2021-06-10 (×5): 40 mg via ORAL
  Filled 2021-06-05 (×5): qty 1

## 2021-06-05 MED ORDER — POVIDONE-IODINE 10 % EX SWAB
2.0000 "application " | Freq: Once | CUTANEOUS | Status: AC
  Administered 2021-06-05: 2 via TOPICAL

## 2021-06-05 MED ORDER — SODIUM CHLORIDE 0.9 % IV SOLN: INTRAVENOUS | Status: DC

## 2021-06-05 MED ORDER — ACETAMINOPHEN 10 MG/ML IV SOLN: 1000.0000 mg | Freq: Once | INTRAVENOUS | Status: DC | PRN

## 2021-06-05 MED ORDER — FENTANYL CITRATE PF 50 MCG/ML IJ SOSY: 25.0000 ug | PREFILLED_SYRINGE | INTRAMUSCULAR | Status: DC | PRN

## 2021-06-05 MED ORDER — CLONIDINE HCL (ANALGESIA) 100 MCG/ML EP SOLN
EPIDURAL | Status: DC | PRN
  Administered 2021-06-05: 100 ug

## 2021-06-05 MED ORDER — THYROID 60 MG PO TABS
60.0000 mg | ORAL_TABLET | Freq: Every day | ORAL | Status: DC
  Administered 2021-06-06 – 2021-06-10 (×5): 60 mg via ORAL
  Filled 2021-06-05 (×5): qty 1

## 2021-06-05 MED ORDER — FENTANYL CITRATE (PF) 100 MCG/2ML IJ SOLN
INTRAMUSCULAR | Status: AC
  Filled 2021-06-05: qty 2

## 2021-06-05 MED ORDER — TRANEXAMIC ACID-NACL 1000-0.7 MG/100ML-% IV SOLN
1000.0000 mg | INTRAVENOUS | Status: AC
  Administered 2021-06-05: 1000 mg via INTRAVENOUS
  Filled 2021-06-05: qty 100

## 2021-06-05 MED ORDER — DEXAMETHASONE SODIUM PHOSPHATE 10 MG/ML IJ SOLN
INTRAMUSCULAR | Status: DC | PRN
  Administered 2021-06-05: 7 mg via INTRAVENOUS

## 2021-06-05 MED ORDER — HYDROCODONE-ACETAMINOPHEN 5-325 MG PO TABS
1.0000 | ORAL_TABLET | ORAL | Status: DC | PRN
  Administered 2021-06-05 – 2021-06-06 (×3): 2 via ORAL
  Filled 2021-06-05 (×3): qty 2

## 2021-06-05 MED ORDER — METOCLOPRAMIDE HCL 5 MG/ML IJ SOLN
5.0000 mg | Freq: Three times a day (TID) | INTRAMUSCULAR | Status: DC | PRN
  Administered 2021-06-06 (×2): 10 mg via INTRAVENOUS
  Filled 2021-06-05 (×2): qty 2

## 2021-06-05 MED ORDER — PRONTOSAN WOUND IRRIGATION OPTIME
TOPICAL | Status: DC | PRN
  Administered 2021-06-05: 1 via TOPICAL

## 2021-06-05 MED ORDER — ASCORBIC ACID 500 MG PO TABS
500.0000 mg | ORAL_TABLET | Freq: Every day | ORAL | Status: DC
  Administered 2021-06-06 – 2021-06-10 (×5): 500 mg via ORAL
  Filled 2021-06-05 (×5): qty 1

## 2021-06-05 MED ORDER — METHOCARBAMOL 500 MG IVPB - SIMPLE MED
500.0000 mg | Freq: Four times a day (QID) | INTRAVENOUS | Status: DC | PRN
  Filled 2021-06-05: qty 50

## 2021-06-05 MED ORDER — ONDANSETRON HCL 4 MG/2ML IJ SOLN: 4.0000 mg | Freq: Once | INTRAMUSCULAR | Status: DC | PRN

## 2021-06-05 MED ORDER — LACTATED RINGERS IV SOLN: INTRAVENOUS | Status: DC

## 2021-06-05 MED ORDER — ATORVASTATIN CALCIUM 10 MG PO TABS
10.0000 mg | ORAL_TABLET | Freq: Every day | ORAL | Status: DC
Start: 2021-06-06 — End: 2021-06-10
  Administered 2021-06-06 – 2021-06-10 (×5): 10 mg via ORAL
  Filled 2021-06-05 (×5): qty 1

## 2021-06-05 MED ORDER — CEFAZOLIN SODIUM-DEXTROSE 2-4 GM/100ML-% IV SOLN
2.0000 g | Freq: Four times a day (QID) | INTRAVENOUS | Status: AC
  Administered 2021-06-05 (×2): 2 g via INTRAVENOUS
  Filled 2021-06-05 (×2): qty 100

## 2021-06-05 MED ORDER — FLUTICASONE PROPIONATE 50 MCG/ACT NA SUSP
1.0000 | Freq: Every day | NASAL | Status: DC
  Administered 2021-06-05 – 2021-06-09 (×5): 1 via NASAL
  Filled 2021-06-05: qty 16

## 2021-06-05 MED ORDER — ONDANSETRON HCL 4 MG/2ML IJ SOLN
4.0000 mg | Freq: Four times a day (QID) | INTRAMUSCULAR | Status: DC | PRN
  Administered 2021-06-06 – 2021-06-08 (×3): 4 mg via INTRAVENOUS
  Filled 2021-06-05 (×3): qty 2

## 2021-06-05 MED ORDER — PROPOFOL 500 MG/50ML IV EMUL
INTRAVENOUS | Status: DC | PRN
  Administered 2021-06-05: 50 ug/kg/min via INTRAVENOUS

## 2021-06-05 MED ORDER — ADULT MULTIVITAMIN W/MINERALS CH
1.0000 | ORAL_TABLET | Freq: Every day | ORAL | Status: DC
  Administered 2021-06-06 – 2021-06-10 (×5): 1 via ORAL
  Filled 2021-06-05 (×5): qty 1

## 2021-06-05 MED ORDER — ALUM & MAG HYDROXIDE-SIMETH 200-200-20 MG/5ML PO SUSP: 30.0000 mL | ORAL | Status: DC | PRN

## 2021-06-05 MED ORDER — PHENOL 1.4 % MT LIQD: 1.0000 | OROMUCOSAL | Status: DC | PRN

## 2021-06-05 MED ORDER — OYSTER SHELL CALCIUM/D3 500-5 MG-MCG PO TABS
1.0000 | ORAL_TABLET | Freq: Every day | ORAL | Status: DC
  Administered 2021-06-06 – 2021-06-10 (×5): 1 via ORAL
  Filled 2021-06-05 (×5): qty 1

## 2021-06-05 MED ORDER — METOPROLOL SUCCINATE ER 25 MG PO TB24
25.0000 mg | ORAL_TABLET | Freq: Every day | ORAL | Status: DC
  Administered 2021-06-05 – 2021-06-07 (×3): 25 mg via ORAL
  Filled 2021-06-05 (×3): qty 1

## 2021-06-05 MED ORDER — SERTRALINE HCL 25 MG PO TABS
25.0000 mg | ORAL_TABLET | Freq: Every day | ORAL | Status: DC
  Administered 2021-06-06 – 2021-06-10 (×5): 25 mg via ORAL
  Filled 2021-06-05 (×5): qty 1

## 2021-06-05 MED ORDER — DEXAMETHASONE SODIUM PHOSPHATE 10 MG/ML IJ SOLN
INTRAMUSCULAR | Status: DC | PRN
  Administered 2021-06-05: 10 mg

## 2021-06-05 MED ORDER — ORAL CARE MOUTH RINSE: 15.0000 mL | Freq: Once | OROMUCOSAL | Status: AC

## 2021-06-05 MED ORDER — NITROGLYCERIN 0.4 MG/SPRAY TL SOLN
1.0000 | Status: DC | PRN
  Filled 2021-06-05: qty 12

## 2021-06-05 MED ORDER — OMEGA-3-ACID ETHYL ESTERS 1 G PO CAPS
2.0000 g | ORAL_CAPSULE | Freq: Every day | ORAL | Status: DC
  Administered 2021-06-06 – 2021-06-10 (×5): 2 g via ORAL
  Filled 2021-06-05 (×5): qty 2

## 2021-06-05 MED ORDER — SODIUM CHLORIDE (PF) 0.9 % IJ SOLN
INTRAMUSCULAR | Status: AC
  Filled 2021-06-05: qty 10

## 2021-06-05 MED ORDER — ONDANSETRON HCL 4 MG PO TABS: 4.0000 mg | ORAL_TABLET | Freq: Four times a day (QID) | ORAL | Status: DC | PRN

## 2021-06-05 MED ORDER — SENNA 8.6 MG PO TABS
1.0000 | ORAL_TABLET | Freq: Two times a day (BID) | ORAL | Status: DC
  Administered 2021-06-05 – 2021-06-10 (×10): 8.6 mg via ORAL
  Filled 2021-06-05 (×10): qty 1

## 2021-06-05 MED ORDER — KETOROLAC TROMETHAMINE 15 MG/ML IJ SOLN
7.5000 mg | Freq: Four times a day (QID) | INTRAMUSCULAR | Status: AC
  Administered 2021-06-05 – 2021-06-06 (×4): 7.5 mg via INTRAVENOUS
  Filled 2021-06-05 (×4): qty 1

## 2021-06-05 MED ORDER — ONDANSETRON HCL 4 MG/2ML IJ SOLN
INTRAMUSCULAR | Status: AC
  Filled 2021-06-05: qty 2

## 2021-06-05 MED ORDER — ACETAMINOPHEN 325 MG PO TABS
325.0000 mg | ORAL_TABLET | Freq: Four times a day (QID) | ORAL | Status: DC | PRN
  Administered 2021-06-06 – 2021-06-09 (×4): 650 mg via ORAL
  Filled 2021-06-05 (×4): qty 2

## 2021-06-05 MED ORDER — DILTIAZEM HCL ER COATED BEADS 120 MG PO CP24
120.0000 mg | ORAL_CAPSULE | Freq: Every day | ORAL | Status: DC
  Administered 2021-06-06 – 2021-06-09 (×4): 120 mg via ORAL
  Filled 2021-06-05 (×4): qty 1

## 2021-06-05 MED ORDER — BUPIVACAINE-EPINEPHRINE (PF) 0.25% -1:200000 IJ SOLN
INTRAMUSCULAR | Status: AC
  Filled 2021-06-05: qty 30

## 2021-06-05 MED ORDER — CEFAZOLIN SODIUM-DEXTROSE 2-4 GM/100ML-% IV SOLN
2.0000 g | INTRAVENOUS | Status: AC
  Administered 2021-06-05: 2 g via INTRAVENOUS
  Filled 2021-06-05: qty 100

## 2021-06-05 MED ORDER — APIXABAN 2.5 MG PO TABS
2.5000 mg | ORAL_TABLET | Freq: Two times a day (BID) | ORAL | Status: DC
  Administered 2021-06-06 – 2021-06-10 (×9): 2.5 mg via ORAL
  Filled 2021-06-05 (×9): qty 1

## 2021-06-05 MED ORDER — PHENYLEPHRINE HCL (PRESSORS) 10 MG/ML IV SOLN
INTRAVENOUS | Status: AC
  Filled 2021-06-05: qty 1

## 2021-06-05 MED ORDER — HYDROCODONE-ACETAMINOPHEN 7.5-325 MG PO TABS: 1.0000 | ORAL_TABLET | ORAL | Status: DC | PRN

## 2021-06-05 MED ORDER — MIDAZOLAM HCL 2 MG/2ML IJ SOLN
INTRAMUSCULAR | Status: AC
  Filled 2021-06-05: qty 2

## 2021-06-05 MED ORDER — FENTANYL CITRATE (PF) 100 MCG/2ML IJ SOLN
INTRAMUSCULAR | Status: DC | PRN
Start: 2021-06-05 — End: 2021-06-05
  Administered 2021-06-05 (×2): 25 ug via INTRAVENOUS
  Administered 2021-06-05: 50 ug via INTRAVENOUS

## 2021-06-05 MED ORDER — METHOCARBAMOL 500 MG PO TABS
500.0000 mg | ORAL_TABLET | Freq: Four times a day (QID) | ORAL | Status: DC | PRN
  Administered 2021-06-05 – 2021-06-09 (×7): 500 mg via ORAL
  Filled 2021-06-05 (×7): qty 1

## 2021-06-05 MED ORDER — ALBUMIN HUMAN 5 % IV SOLN
12.5000 g | Freq: Once | INTRAVENOUS | Status: AC
  Administered 2021-06-05: 12.5 g via INTRAVENOUS

## 2021-06-05 MED ORDER — ROPIVACAINE HCL 5 MG/ML IJ SOLN
INTRAMUSCULAR | Status: DC | PRN
  Administered 2021-06-05 (×6): 5 mL via PERINEURAL

## 2021-06-05 MED ORDER — CHLORHEXIDINE GLUCONATE 0.12 % MT SOLN
15.0000 mL | Freq: Once | OROMUCOSAL | Status: AC
  Administered 2021-06-05: 15 mL via OROMUCOSAL

## 2021-06-05 MED ORDER — LIPOIC ACID 150 MG PO CAPS
600.0000 mg | ORAL_CAPSULE | Freq: Every day | ORAL | Status: DC
Start: 2021-06-05 — End: 2021-06-05

## 2021-06-05 MED ORDER — SODIUM CHLORIDE (PF) 0.9 % IJ SOLN
INTRAMUSCULAR | Status: DC | PRN
  Administered 2021-06-05: 30 mL

## 2021-06-05 MED ORDER — MENTHOL 3 MG MT LOZG: 1.0000 | LOZENGE | OROMUCOSAL | Status: DC | PRN

## 2021-06-05 MED ORDER — CYCLOSPORINE 0.05 % OP EMUL
1.0000 [drp] | Freq: Every day | OPHTHALMIC | Status: DC
  Administered 2021-06-05 – 2021-06-09 (×5): 1 [drp] via OPHTHALMIC
  Filled 2021-06-05 (×5): qty 30

## 2021-06-05 MED ORDER — METOCLOPRAMIDE HCL 5 MG PO TABS
5.0000 mg | ORAL_TABLET | Freq: Three times a day (TID) | ORAL | Status: DC | PRN
  Administered 2021-06-07 (×2): 10 mg via ORAL
  Filled 2021-06-05 (×2): qty 2

## 2021-06-05 MED ORDER — PROPOFOL 10 MG/ML IV BOLUS
INTRAVENOUS | Status: DC | PRN
  Administered 2021-06-05: 20 mg via INTRAVENOUS
  Administered 2021-06-05: 10 mg via INTRAVENOUS

## 2021-06-05 MED ORDER — POLYETHYLENE GLYCOL 3350 17 G PO PACK: 17.0000 g | PACK | Freq: Every day | ORAL | Status: DC | PRN

## 2021-06-05 MED ORDER — KETOROLAC TROMETHAMINE 30 MG/ML IJ SOLN
INTRAMUSCULAR | Status: DC | PRN
  Administered 2021-06-05: 30 mg

## 2021-06-05 MED ORDER — BUPIVACAINE-EPINEPHRINE 0.25% -1:200000 IJ SOLN
INTRAMUSCULAR | Status: DC | PRN
  Administered 2021-06-05: 30 mL

## 2021-06-05 MED ORDER — MORPHINE SULFATE (PF) 2 MG/ML IV SOLN: 0.5000 mg | INTRAVENOUS | Status: DC | PRN

## 2021-06-05 MED ORDER — KETOROLAC TROMETHAMINE 30 MG/ML IJ SOLN
INTRAMUSCULAR | Status: AC
  Filled 2021-06-05: qty 1

## 2021-06-05 MED ORDER — STERILE WATER FOR IRRIGATION IR SOLN
Status: DC | PRN
Start: 2021-06-05 — End: 2021-06-05
  Administered 2021-06-05: 2000 mL

## 2021-06-05 MED ORDER — DIPHENHYDRAMINE HCL 12.5 MG/5ML PO ELIX: 12.5000 mg | ORAL_SOLUTION | ORAL | Status: DC | PRN

## 2021-06-05 MED ORDER — DEXAMETHASONE SODIUM PHOSPHATE 10 MG/ML IJ SOLN
INTRAMUSCULAR | Status: AC
  Filled 2021-06-05: qty 1

## 2021-06-05 MED ORDER — SODIUM CHLORIDE 0.9 % IR SOLN
Status: DC | PRN
  Administered 2021-06-05 (×2): 1000 mL

## 2021-06-05 MED ORDER — ALBUMIN HUMAN 5 % IV SOLN
INTRAVENOUS | Status: AC
  Filled 2021-06-05: qty 250

## 2021-06-05 MED ORDER — MELATONIN 5 MG PO TABS
5.0000 mg | ORAL_TABLET | Freq: Every day | ORAL | Status: DC
  Administered 2021-06-05 – 2021-06-09 (×5): 5 mg via ORAL
  Filled 2021-06-05 (×5): qty 1

## 2021-06-05 MED ORDER — DOCUSATE SODIUM 100 MG PO CAPS
100.0000 mg | ORAL_CAPSULE | Freq: Two times a day (BID) | ORAL | Status: DC
  Administered 2021-06-05 – 2021-06-10 (×10): 100 mg via ORAL
  Filled 2021-06-05 (×10): qty 1

## 2021-06-05 MED ORDER — POVIDONE-IODINE 10 % EX SWAB
2.0000 | Freq: Once | CUTANEOUS | Status: DC
Start: 2021-06-05 — End: 2021-06-05

## 2021-06-05 MED ORDER — ONDANSETRON HCL 4 MG/2ML IJ SOLN
INTRAMUSCULAR | Status: DC | PRN
  Administered 2021-06-05: 4 mg via INTRAVENOUS

## 2021-06-05 MED ORDER — MECLIZINE HCL 25 MG PO TABS: 25.0000 mg | ORAL_TABLET | Freq: Every day | ORAL | Status: DC | PRN

## 2021-06-05 MED ORDER — OMEGA-3 1000 MG PO CAPS: 2000.0000 mg | ORAL_CAPSULE | Freq: Every day | ORAL | Status: DC

## 2021-06-05 MED ORDER — PHENYLEPHRINE HCL-NACL 20-0.9 MG/250ML-% IV SOLN
INTRAVENOUS | Status: DC | PRN
  Administered 2021-06-05: 50 ug/min via INTRAVENOUS

## 2021-06-05 SURGICAL SUPPLY — 68 items
BAG COUNTER SPONGE SURGICOUNT (BAG) ×1 IMPLANT
BAG ZIPLOCK 12X15 (MISCELLANEOUS) ×1 IMPLANT
BATTERY INSTRU NAVIGATION (MISCELLANEOUS) ×6 IMPLANT
BEARING TIBIA INSRT KNEE SZ4 9 (Insert) IMPLANT
BLADE SAW RECIPROCATING 77.5 (BLADE) ×2 IMPLANT
BNDG ELASTIC 4X5.8 VLCR STR LF (GAUZE/BANDAGES/DRESSINGS) ×2 IMPLANT
BNDG ELASTIC 6X5.8 VLCR STR LF (GAUZE/BANDAGES/DRESSINGS) ×2 IMPLANT
CHLORAPREP W/TINT 26 (MISCELLANEOUS) ×4 IMPLANT
COMPONENT TRI CR FEM SZ5 KNEE (Orthopedic Implant) IMPLANT
COVER SURGICAL LIGHT HANDLE (MISCELLANEOUS) ×2 IMPLANT
DERMABOND ADVANCED (GAUZE/BANDAGES/DRESSINGS) ×2
DERMABOND ADVANCED .7 DNX12 (GAUZE/BANDAGES/DRESSINGS) ×2 IMPLANT
DRAPE INCISE IOBAN 66X45 STRL (DRAPES) ×1 IMPLANT
DRAPE SHEET LG 3/4 BI-LAMINATE (DRAPES) ×6 IMPLANT
DRAPE U-SHAPE 47X51 STRL (DRAPES) ×2 IMPLANT
DRSG AQUACEL AG ADV 3.5X10 (GAUZE/BANDAGES/DRESSINGS) ×1 IMPLANT
DRSG AQUACEL AG ADV 3.5X14 (GAUZE/BANDAGES/DRESSINGS) ×2 IMPLANT
ELECT BLADE TIP CTD 4 INCH (ELECTRODE) ×2 IMPLANT
ELECT REM PT RETURN 15FT ADLT (MISCELLANEOUS) ×2 IMPLANT
GAUZE SPONGE 4X4 12PLY STRL (GAUZE/BANDAGES/DRESSINGS) ×2 IMPLANT
GLOVE SRG 8 PF TXTR STRL LF DI (GLOVE) ×2 IMPLANT
GLOVE SURG ENC MOIS LTX SZ8.5 (GLOVE) ×4 IMPLANT
GLOVE SURG ENC TEXT LTX SZ7.5 (GLOVE) ×6 IMPLANT
GLOVE SURG UNDER POLY LF SZ8 (GLOVE) ×4
GLOVE SURG UNDER POLY LF SZ8.5 (GLOVE) ×2 IMPLANT
GOWN SPEC L3 XXLG W/TWL (GOWN DISPOSABLE) ×2 IMPLANT
GOWN STRL REUS W/TWL XL LVL3 (GOWN DISPOSABLE) ×2 IMPLANT
HANDPIECE INTERPULSE COAX TIP (DISPOSABLE) ×2
HOLDER FOLEY CATH W/STRAP (MISCELLANEOUS) ×2 IMPLANT
HOOD PEEL AWAY FLYTE STAYCOOL (MISCELLANEOUS) ×6 IMPLANT
JET LAVAGE IRRISEPT WOUND (IRRIGATION / IRRIGATOR)
KIT TURNOVER KIT A (KITS) IMPLANT
KNEE PATELLA ASYMMETRIC 10X32 (Knees) ×1 IMPLANT
KNEE TIBIAL COMP TRI SZ4 (Knees) ×1 IMPLANT
LAVAGE JET IRRISEPT WOUND (IRRIGATION / IRRIGATOR) IMPLANT
MARKER SKIN DUAL TIP RULER LAB (MISCELLANEOUS) ×2 IMPLANT
NDL SAFETY ECLIPSE 18X1.5 (NEEDLE) ×1 IMPLANT
NDL SPNL 18GX3.5 QUINCKE PK (NEEDLE) ×1 IMPLANT
NEEDLE HYPO 18GX1.5 SHARP (NEEDLE) ×2
NEEDLE SPNL 18GX3.5 QUINCKE PK (NEEDLE) ×2 IMPLANT
NS IRRIG 1000ML POUR BTL (IV SOLUTION) ×2 IMPLANT
PACK TOTAL KNEE CUSTOM (KITS) ×2 IMPLANT
PADDING CAST COTTON 6X4 STRL (CAST SUPPLIES) ×2 IMPLANT
PROTECTOR NERVE ULNAR (MISCELLANEOUS) ×2 IMPLANT
SAW OSC TIP CART 19.5X105X1.3 (SAW) ×2 IMPLANT
SEALER BIPOLAR AQUA 6.0 (INSTRUMENTS) ×2 IMPLANT
SET HNDPC FAN SPRY TIP SCT (DISPOSABLE) ×1 IMPLANT
SET PAD KNEE POSITIONER (MISCELLANEOUS) ×2 IMPLANT
SPIKE FLUID TRANSFER (MISCELLANEOUS) ×2 IMPLANT
SPONGE T-LAP 18X18 ~~LOC~~+RFID (SPONGE) ×5 IMPLANT
SUT MNCRL AB 3-0 PS2 18 (SUTURE) ×2 IMPLANT
SUT MNCRL AB 4-0 PS2 18 (SUTURE) ×2 IMPLANT
SUT MON AB 2-0 CT1 36 (SUTURE) ×3 IMPLANT
SUT STRATAFIX PDO 1 14 VIOLET (SUTURE) ×2
SUT STRATFX PDO 1 14 VIOLET (SUTURE) ×1
SUT VIC AB 1 CTX 36 (SUTURE) ×4
SUT VIC AB 1 CTX36XBRD ANBCTR (SUTURE) ×2 IMPLANT
SUT VIC AB 2-0 CT1 27 (SUTURE) ×2
SUT VIC AB 2-0 CT1 TAPERPNT 27 (SUTURE) ×1 IMPLANT
SUTURE STRATFX PDO 1 14 VIOLET (SUTURE) ×1 IMPLANT
SYR 3ML LL SCALE MARK (SYRINGE) ×1 IMPLANT
TIBIA BEAR INSERT KNEE SZ4 9 (Insert) ×2 IMPLANT
TRAY FOLEY MTR SLVR 14FR STAT (SET/KITS/TRAYS/PACK) ×1 IMPLANT
TRAY FOLEY MTR SLVR 16FR STAT (SET/KITS/TRAYS/PACK) IMPLANT
TRI CRUC RET FEM SZ5 KNEE (Orthopedic Implant) ×2 IMPLANT
TUBE SUCTION HIGH CAP CLEAR NV (SUCTIONS) ×2 IMPLANT
WATER STERILE IRR 1000ML POUR (IV SOLUTION) ×4 IMPLANT
WRAP KNEE MAXI GEL POST OP (GAUZE/BANDAGES/DRESSINGS) ×1 IMPLANT

## 2021-06-05 NOTE — Anesthesia Procedure Notes (Signed)
Spinal  Patient location during procedure: OR End time: 06/05/2021 7:43 AM Staffing Resident/CRNA: Lissa Morales, CRNA Preanesthetic Checklist Completed: patient identified, IV checked, site marked, risks and benefits discussed, surgical consent, monitors and equipment checked, pre-op evaluation and timeout performed Spinal Block Patient position: sitting Prep: DuraPrep Patient monitoring: heart rate, continuous pulse ox and blood pressure Approach: midline Location: L4-5 Needle Needle type: Spinocan and Pencan  Needle gauge: 24 G Needle length: 9 cm Assessment Sensory level: T4 Events: CSF return Additional Notes Monitors applied, IV functioning, Sterile prep/drape/gloves used, skin prepped and anesthetized with lidocaine, free flow of clear CSF prior to injecting local anesthetic on  first   attempt, negative heme, negative paraesthesia, needle carefully withdrawn, patient tolerated procedure well.

## 2021-06-05 NOTE — Evaluation (Signed)
Physical Therapy Evaluation Patient Details Name: Cynthia [name] MRN: [id number] DOB: [birth year] Today's Date: 06/05/2021  History of Present Illness  81 y.o. female admitted 06/05/21 for R TKA. PMH includes: hypotension, normal pressure hydocephalus, polyneuropathy, limb movement d/o, acoustic neuroma excision.  Clinical Impression  Pt is s/p TKA resulting in the deficits listed below (see PT Problem List). Pt ambulated 52' with RW, no loss of balance. Initiated TKA HEP. Good progress expected.  Pt will benefit from skilled PT to increase their independence and safety with mobility to allow discharge to the venue listed below.         Recommendations for follow up therapy are one component of a multi-disciplinary discharge planning process, led by the attending physician.  Recommendations may be updated based on patient status, additional functional criteria and insurance authorization.  Follow Up Recommendations Outpatient PT    Assistance Recommended at Discharge Intermittent Supervision/Assistance  Patient can return home with the following  A little help with bathing/dressing/bathroom;Assist for transportation;Help with stairs or ramp for entrance    Equipment Recommendations None recommended by PT  Recommendations for Other Services       Functional Status Assessment Patient has had a recent decline in their functional status and demonstrates the ability to make significant improvements in function in a reasonable and predictable amount of time.     Precautions / Restrictions Precautions Precautions: Knee Precaution Comments: instructed pt in no pillow under knee Restrictions Weight Bearing Restrictions: No      Mobility  Bed Mobility Overal bed mobility: Needs Assistance Bed Mobility: Supine to Sit     Supine to sit: Min assist, HOB elevated     General bed mobility comments: used rail, min A to raise trunk    Transfers Overall transfer level: Needs  assistance Equipment used: Rolling walker (2 wheels) Transfers: Sit to/from Stand Sit to Stand: Min assist           General transfer comment: min A to power up, VCs hand placement    Ambulation/Gait Ambulation/Gait assistance: Min guard Gait Distance (Feet): 45 Feet Assistive device: Rolling walker (2 wheels) Gait Pattern/deviations: Step-to pattern, Decreased step length - right, Decreased step length - left Gait velocity: decr     General Gait Details: VCs sequencing, no loss of balance  Stairs            Wheelchair Mobility    Modified Rankin (Stroke Patients Only)       Balance Overall balance assessment: Modified Independent             Standing balance comment: steady with RW                             Pertinent Vitals/Pain Pain Assessment Pain Assessment: 0-10 Pain Score: 7  Pain Location: R knee with walking Pain Descriptors / Indicators: Sore Pain Intervention(s): Limited activity within patient's tolerance, Monitored during session, Premedicated before session, Ice applied    Home Living Family/patient expects to be discharged to:: Private residence Living Arrangements: Spouse/significant other Available Help at Discharge: Family Type of Home: House Home Access: Stairs to enter Entrance Stairs-Rails: None Entrance Stairs-Number of Steps: 2   Home Layout: One level Home Equipment: Conservation officer, nature (2 wheels)      Prior Function Prior Level of Function : Independent/Modified Independent             Mobility Comments: walks with RW, no falls in 6 months  Hand Dominance   Dominant Hand: Right    Extremity/Trunk Assessment   Upper Extremity Assessment Upper Extremity Assessment: Overall WFL for tasks assessed    Lower Extremity Assessment Lower Extremity Assessment: RLE deficits/detail RLE Deficits / Details: knee ext -3/5, SLR -3/5 RLE Sensation: WNL RLE Coordination: WNL    Cervical / Trunk  Assessment Cervical / Trunk Assessment: Normal  Communication   Communication: No difficulties  Cognition Arousal/Alertness: Awake/alert Behavior During Therapy: WFL for tasks assessed/performed Overall Cognitive Status: Within Functional Limits for tasks assessed                                          General Comments      Exercises Total Joint Exercises Ankle Circles/Pumps: AROM, Both, 10 reps, Supine Heel Slides: AAROM, Right, 10 reps, Supine Long Arc Quad: AROM, Right, 5 reps, Seated   Assessment/Plan    PT Assessment Patient needs continued PT services  PT Problem List Decreased activity tolerance;Decreased mobility;Pain;Decreased range of motion;Decreased strength       PT Treatment Interventions Gait training;Therapeutic activities;Therapeutic exercise;Patient/family education    PT Goals (Current goals can be found in the Care Plan section)  Acute Rehab PT Goals Patient Stated Goal: decrease pain, be able to walk PT Goal Formulation: With patient Time For Goal Achievement: 06/12/21 Potential to Achieve Goals: Good    Frequency 7X/week     Co-evaluation               AM-PAC PT "6 Clicks" Mobility  Outcome Measure Help needed turning from your back to your side while in a flat bed without using bedrails?: A Little Help needed moving from lying on your back to sitting on the side of a flat bed without using bedrails?: A Little Help needed moving to and from a bed to a chair (including a wheelchair)?: A Little Help needed standing up from a chair using your arms (e.g., wheelchair or bedside chair)?: A Little Help needed to walk in hospital room?: A Little Help needed climbing 3-5 steps with a railing? : A Lot 6 Click Score: 17    End of Session Equipment Utilized During Treatment: Gait belt Activity Tolerance: Patient tolerated treatment well Patient left: in chair;with call bell/phone within reach;with chair alarm set Nurse  Communication: Mobility status PT Visit Diagnosis: Difficulty in walking, not elsewhere classified (R26.2);Pain Pain - Right/Left: Right Pain - part of body: Knee    Time: 5465-6812 PT Time Calculation (min) (ACUTE ONLY): 26 min   Charges:   PT Evaluation $PT Eval Moderate Complexity: 1 Mod PT Treatments $Gait Training: 8-22 mins       Blondell Reveal Kistler PT 06/05/2021  Acute Rehabilitation Services Pager [phone] Office [phone]

## 2021-06-05 NOTE — Anesthesia Procedure Notes (Signed)
Anesthesia Regional Block: Adductor canal block   Pre-Anesthetic Checklist: , timeout performed,  Correct Patient, Correct Site, Correct Laterality,  Correct Procedure, Correct Position, site marked,  Risks and benefits discussed,  Surgical consent,  Pre-op evaluation,  At surgeon's request and post-op pain management  Laterality: Lower and Right  Prep: chloraprep       Needles:  Injection technique: Single-shot  Needle Type: Echogenic Stimulator Needle     Needle Length: 9cm  Needle Gauge: 20   Needle insertion depth: 2.5 cm   Additional Needles:   Procedures:,,,, ultrasound used (permanent image in chart),,    Narrative:  Start time: 06/05/2021 7:20 AM End time: 06/05/2021 7:27 AM Injection made incrementally with aspirations every 5 mL.  Performed by: Personally  Anesthesiologist: Lyn Hollingshead, MD

## 2021-06-05 NOTE — Discharge Instructions (Signed)
 Dr. Josephine Rudnick Total Joint Specialist [facility] [address] [place], [place] [address] [phone]  TOTAL KNEE REPLACEMENT POSTOPERATIVE DIRECTIONS    Knee Rehabilitation, Guidelines Following Surgery  Results after knee surgery are often greatly improved when you follow the exercise, range of motion and muscle strengthening exercises prescribed by your doctor. Safety measures are also important to protect the knee from further injury. Any time any of these exercises cause you to have increased pain or swelling in your knee joint, decrease the amount until you are comfortable again and slowly increase them. If you have problems or questions, call your caregiver or physical therapist for advice.   WEIGHT BEARING Weight bearing as tolerated with assist device (walker, cane, etc) as directed, use it as long as suggested by your surgeon or therapist, typically at least 4-6 weeks.  HOME CARE INSTRUCTIONS  Remove items at home which could result in a fall. This includes throw rugs or furniture in walking pathways.  Continue medications as instructed at time of discharge. You may have some home medications which will be placed on hold until you complete the course of blood thinner medication.  You may start showering once you are discharged home but do not submerge the incision under water. Just pat the incision dry and apply a dry gauze dressing on daily. Walk with walker as instructed.  You may resume a sexual relationship in one month or when given the OK by your doctor.  Use walker as long as suggested by your caregivers. Avoid periods of inactivity such as sitting longer than an hour when not asleep. This helps prevent blood clots.  You may put full weight on your legs and walk as much as is comfortable.  You may return to work once you are cleared by your doctor.  Do not drive a car for 6 weeks or until released by you surgeon.  Do not drive while  taking narcotics.  Wear the elastic stockings for three weeks following surgery during the day but you may remove then at night. Make sure you keep all of your appointments after your operation with all of your doctors and caregivers. You should call the office at the above phone number and make an appointment for approximately two weeks after the date of your surgery. Do not remove your surgical dressing. The dressing is waterproof; you may take showers in 3 days, but do not take tub baths or submerge the dressing. Please pick up a stool softener and laxative for home use as long as you are requiring pain medications. ICE to the affected knee every three hours for 30 minutes at a time and then as needed for pain and swelling.  Continue to use ice on the knee for pain and swelling from surgery. You may notice swelling that will progress down to the foot and ankle.  This is normal after surgery.  Elevate the leg when you are not up walking on it.   It is important for you to complete the blood thinner medication as prescribed by your doctor. Continue to use the breathing machine which will help keep your temperature down.  It is common for your temperature to cycle up and down following surgery, especially at night when you are not up moving around and exerting yourself.  The breathing machine keeps your lungs expanded and your temperature down.  RANGE OF MOTION AND STRENGTHENING EXERCISES  Rehabilitation of the knee is important following a knee injury or an   operation. After just a few days of immobilization, the muscles of the thigh which control the knee become weakened and shrink (atrophy). Knee exercises are designed to build up the tone and strength of the thigh muscles and to improve knee motion. Often times heat used for twenty to thirty minutes before working out will loosen up your tissues and help with improving the range of motion but do not use heat for the first two weeks following surgery.  These exercises can be done on a training (exercise) mat, on the floor, on a table or on a bed. Use what ever works the best and is most comfortable for you Knee exercises include:  Leg Lifts - While your knee is still immobilized in a splint or cast, you can do straight leg raises. Lift the leg to 60 degrees, hold for 3 sec, and slowly lower the leg. Repeat 10-20 times 2-3 times daily. Perform this exercise against resistance later as your knee gets better.  Quad and Hamstring Sets - Tighten up the muscle on the front of the thigh (Quad) and hold for 5-10 sec. Repeat this 10-20 times hourly. Hamstring sets are done by pushing the foot backward against an object and holding for 5-10 sec. Repeat as with quad sets.  A rehabilitation program following serious knee injuries can speed recovery and prevent re-injury in the future due to weakened muscles. Contact your doctor or a physical therapist for more information on knee rehabilitation.   POST-OPERATIVE OPIOID TAPER INSTRUCTIONS: It is important to wean off of your opioid medication as soon as possible. If you do not need pain medication after your surgery it is ok to stop day one. Opioids include: Codeine, Hydrocodone(Norco, Vicodin), Oxycodone(Percocet, oxycontin) and hydromorphone amongst others.  Long term and even short term use of opiods can cause: Increased pain response Dependence Constipation Depression Respiratory depression And more.  Withdrawal symptoms can include Flu like symptoms Nausea, vomiting And more Techniques to manage these symptoms Hydrate well Eat regular healthy meals Stay active Use relaxation techniques(deep breathing, meditating, yoga) Do Not substitute Alcohol to help with tapering If you have been on opioids for less than two weeks and do not have pain than it is ok to stop all together.  Plan to wean off of opioids This plan should start within one week post op of your joint replacement. Maintain the same  interval or time between taking each dose and first decrease the dose.  Cut the total daily intake of opioids by one tablet each day Next start to increase the time between doses. The last dose that should be eliminated is the evening dose.    SKILLED REHAB INSTRUCTIONS: If the patient is transferred to a skilled rehab facility following release from the hospital, a list of the current medications will be sent to the facility for the patient to continue.  When discharged from the skilled rehab facility, please have the facility set up the patient's Home Health Physical Therapy prior to being released. Also, the skilled facility will be responsible for providing the patient with their medications at time of release from the facility to include their pain medication, the muscle relaxants, and their blood thinner medication. If the patient is still at the rehab facility at time of the two week follow up appointment, the skilled rehab facility will also need to assist the patient in arranging follow up appointment in our office and any transportation needs.  MAKE SURE YOU:  Understand these instructions.  Will watch   your condition.  Will get help right away if you are not doing well or get worse.    Pick up stool softner and laxative for home use following surgery while on pain medications. Do NOT remove your dressing. You may shower.  Do not take tub baths or submerge incision under water. May shower starting three days after surgery. Please use a clean towel to pat the incision dry following showers. Continue to use ice for pain and swelling after surgery. Do not use any lotions or creams on the incision until instructed by your surgeon.  

## 2021-06-05 NOTE — Progress Notes (Signed)
PHARMACIST - PHYSICIAN ORDER COMMUNICATION  CONCERNING: P&T Medication Policy on Herbal Medications  DESCRIPTION:  This patients order for:  Lipoic Acid  has been noted.  This product(s) is classified as an herbal or natural product. Due to a lack of definitive safety studies or FDA approval, nonstandard manufacturing practices, plus the potential risk of unknown drug-drug interactions while on inpatient medications, the Pharmacy and Therapeutics Committee does not permit the use of herbal or natural products of this type within [facility].   ACTION TAKEN: The pharmacy department is unable to verify this order at this time and your patient has been informed of this safety policy. Please reevaluate patients clinical condition at discharge and address if the herbal or natural product(s) should be resumed at that time.   Leone Haven, PharmD

## 2021-06-05 NOTE — Op Note (Signed)
OPERATIVE REPORT  SURGEON: Rod Can, MD   ASSISTANT: Nehemiah Massed, PA-C  PREOPERATIVE DIAGNOSIS: Primary Right knee arthritis.   POSTOPERATIVE DIAGNOSIS: Primary Right knee arthritis.   PROCEDURE: Computer assisted Right total knee arthroplasty.   IMPLANTS: Stryker Triathlon CR femur, size 5. Stryker Tritanium tibia, size 4. X3 polyethelyene insert, size 9 mm, CR. 3 button asymmetric patella, size 32 mm.  ANESTHESIA:  MAC, Regional, and Spinal  TOURNIQUET TIME: Not utilized.   ESTIMATED BLOOD LOSS:-100 mL    ANTIBIOTICS: 2 g Ancef.  DRAINS: None.  COMPLICATIONS: None   CONDITION: PACU - hemodynamically stable.   BRIEF CLINICAL NOTE: Cynthia [name] is a 81 y.o. female with a long-standing history of Right knee arthritis. After failing conservative management, the patient was indicated for total knee arthroplasty. The risks, benefits, and alternatives to the procedure were explained, and the patient elected to proceed.  PROCEDURE IN DETAIL: Adductor canal block was obtained in the pre-op holding area. Once inside the operative room, spinal anesthesia was obtained, and a foley catheter was inserted. The patient was then positioned and the lower extremity was prepped and draped in the normal sterile surgical fashion.  A time-out was called verifying side and site of surgery. The patient received IV antibiotics within 60 minutes of beginning the procedure. A tourniquet was not utilized.   An anterior approach to the knee was performed utilizing a midvastus arthrotomy. A medial release was performed and the patellar fat pad was excised. Stryker imageless navigation was used to cut the distal femur perpendicular to the mechanical axis. A freehand patellar resection was performed, and the patella was sized and prepared with 3 lug holes.  Nagivation was used to make a neutral  proximal tibia resection, taking 3 mm of bone from the less affected medial side with 3 degrees of slope. The menisci were excised. A spacer block was placed, and the alignment and balance in extension were confirmed.   The distal femur was sized using the 3-degree external rotation guide referencing the posterior femoral cortex. The appropriate 4-in-1 cutting block was pinned into place. Rotation was checked using Whiteside's line, the epicondylar axis, and then confirmed with a spacer block in flexion. The remaining femoral cuts were performed, taking care to protect the MCL.  The tibia was sized and the trial tray was pinned into place. The remaining trail components were inserted. The knee was stable to varus and valgus stress through a full range of motion. The patella tracked centrally, and the PCL was well balanced. The trial components were removed, and the proximal tibial surface was prepared. Final components were impacted into place. The knee was tested for a final time and found to be well balanced.   The wound was copiously irrigated with Prontosan solution and normal saline using pule lavage.  Marcaine solution was injected into the periarticular soft tissue.  The wound was closed in layers using #1 Vicryl and Stratafix for the fascia, 2-0 Vicryl for the subcutaneous fat, 2-0 Monocryl for the deep dermal layer, 3-0 running Monocryl subcuticular Stitch, and 4-0 Monocryl stay sutures at both ends of the wound. Dermabond was applied to the skin.  Once the glue was fully dried, an Aquacell Ag and compressive dressing were applied.  The patient was transported to the recovery room in stable condition.  Sponge, needle, and instrument counts were correct at the end of the case x2.  The patient tolerated the procedure well and there were no known complications.  Please note that  a surgical assistant was a medical necessity for this procedure in order to perform it in a safe and expeditious manner.  Surgical assistant was necessary to retract the ligaments and vital neurovascular structures to prevent injury to them and also necessary for proper positioning of the limb to allow for anatomic placement of the prosthesis.

## 2021-06-05 NOTE — Anesthesia Procedure Notes (Signed)
Procedure Name: MAC Date/Time: 06/05/2021 7:38 AM Performed by: Lissa Morales, CRNA Pre-anesthesia Checklist: Patient identified, Emergency Drugs available, Suction available and Patient being monitored Patient Re-evaluated:Patient Re-evaluated prior to induction Oxygen Delivery Method: Simple face mask Preoxygenation: Pre-oxygenation with 100% oxygen Placement Confirmation: positive ETCO2

## 2021-06-05 NOTE — Transfer of Care (Signed)
Immediate Anesthesia Transfer of Care Note  Patient: Cynthia [name]  Procedure(s) Performed: COMPUTER ASSISTED TOTAL KNEE ARTHROPLASTY (Right: Knee)  Patient Location: PACU  Anesthesia Type:Spinal  Level of Consciousness: sedated and patient cooperative  Airway & Oxygen Therapy: Patient Spontanous Breathing and Patient connected to face mask oxygen  Post-op Assessment: Report given to RN and Post -op Vital signs reviewed and stable  Post vital signs: stable  Last Vitals:  Vitals Value Taken Time  BP 91/51 06/05/21 1024  Temp 36.5 C 06/05/21 1022  Pulse 58 06/05/21 1026  Resp 13 06/05/21 1026  SpO2 99 % 06/05/21 1026  Vitals shown include unvalidated device data.  Last Pain:  Vitals:   06/05/21 0609  TempSrc:   PainSc: 0-No pain      Patients Stated Pain Goal: 4 (88/82/80 0349)  Complications: No notable events documented.

## 2021-06-05 NOTE — Care Plan (Signed)
Ortho Bundle Case Management Note  Patient Details  Name: Cynthia [name] MRN: [id number] Date of Birth: 09/06/1940                  R TKA on 06-05-21. DCP: Home with husband DME: No needs, has a RW PT: EO on 06-09-21   DME Arranged:  N/A DME Agency:     HH Arranged:    [place] Agency:     Additional Comments: Please contact me with any questions of if this plan should need to change.  Marianne Sofia, RN,CCM [facility]  [phone] 06/05/2021, 10:37 AM

## 2021-06-05 NOTE — Anesthesia Postprocedure Evaluation (Signed)
Anesthesia Post Note  Patient: Cynthia [name]  Procedure(s) Performed: COMPUTER ASSISTED TOTAL KNEE ARTHROPLASTY (Right: Knee)     Patient location during evaluation: PACU Anesthesia Type: Spinal Level of consciousness: awake and sedated Pain management: pain level controlled Vital Signs Assessment: post-procedure vital signs reviewed and stable Respiratory status: spontaneous breathing Cardiovascular status: stable Postop Assessment: no headache, no backache, spinal receding, patient able to bend at knees and no apparent nausea or vomiting Anesthetic complications: no   No notable events documented.  Last Vitals:  Vitals:   06/05/21 1030 06/05/21 1035  BP: (!) 86/48 (!) 79/49  Pulse: (!) 56 (!) 57  Resp: 14 14  Temp:    SpO2: 99% 100%    Last Pain:  Vitals:   06/05/21 1022  TempSrc:   PainSc: [place] [facility]

## 2021-06-05 NOTE — Plan of Care (Signed)
°  Problem: Health Behavior/Discharge Planning: Goal: Ability to manage health-related needs will improve Outcome: Progressing   Problem: Clinical Measurements: Goal: Ability to maintain clinical measurements within normal limits will improve Outcome: Progressing Goal: Respiratory complications will improve Outcome: Progressing   Problem: Activity: Goal: Risk for activity intolerance will decrease Outcome: Progressing   Problem: Elimination: Goal: Will not experience complications related to urinary retention Outcome: Progressing   Problem: Pain Managment: Goal: General experience of comfort will improve Outcome: Progressing

## 2021-06-05 NOTE — Interval H&P Note (Signed)
History and Physical Interval Note:  06/05/2021 7:32 AM  Cynthia [name]  has presented today for surgery, with the diagnosis of Right knee osteoarthritis- valgus.  The various methods of treatment have been discussed with the patient and family. After consideration of risks, benefits and other options for treatment, the patient has consented to  Procedure(s): COMPUTER ASSISTED TOTAL KNEE ARTHROPLASTY (Right) as a surgical intervention.  The patient's history has been reviewed, patient examined, no change in status, stable for surgery.  I have reviewed the patient's chart and labs.  Questions were answered to the patient's satisfaction.    The risks, benefits, and alternatives were discussed with the patient. There are risks associated with the surgery including, but not limited to, problems with anesthesia (death), infection, instability (giving out of the joint), dislocation, differences in leg length/angulation/rotation, fracture of bones, loosening or failure of implants, hematoma (blood accumulation) which may require surgical drainage, blood clots, pulmonary embolism, nerve injury (foot drop and lateral thigh numbness), and blood vessel injury. The patient understands these risks and elects to proceed.    Hilton Cork Jennilee Demarco

## 2021-06-06 ENCOUNTER — Encounter (HOSPITAL_COMMUNITY): Payer: Self-pay | Admitting: Orthopedic Surgery

## 2021-06-06 LAB — BASIC METABOLIC PANEL
Anion gap: 6 (ref 5–15)
BUN: 24 mg/dL — ABNORMAL HIGH (ref 8–23)
CO2: 23 mmol/L (ref 22–32)
Calcium: 8.6 mg/dL — ABNORMAL LOW (ref 8.9–10.3)
Chloride: 108 mmol/L (ref 98–111)
Creatinine, Ser: 0.69 mg/dL (ref 0.44–1.00)
GFR, Estimated: >60 mL/min (ref >60)
Glucose, Bld: 156 mg/dL — ABNORMAL HIGH (ref 70–99)
Potassium: 4 mmol/L (ref 3.5–5.1)
Sodium: 137 mmol/L (ref 135–145)

## 2021-06-06 LAB — CBC
HCT: 28.5 % — ABNORMAL LOW (ref 36.0–46.0)
Hemoglobin: 9.5 g/dL — ABNORMAL LOW (ref 12.0–15.0)
MCH: 32.1 pg (ref 26.0–34.0)
MCHC: 33.3 g/dL (ref 30.0–36.0)
MCV: 96.3 fL (ref 80.0–100.0)
Platelets: 145 10*3/uL — ABNORMAL LOW (ref 150–400)
RBC: 2.96 MIL/uL — ABNORMAL LOW (ref 3.87–5.11)
RDW: 12.8 % (ref 11.5–15.5)
WBC: 15.6 10*3/uL — ABNORMAL HIGH (ref 4.0–10.5)
nRBC: 0 % (ref 0.0–0.2)

## 2021-06-06 MED ORDER — PROMETHAZINE HCL 25 MG RE SUPP
12.5000 mg | Freq: Four times a day (QID) | RECTAL | Status: DC | PRN
Start: 2021-06-06 — End: 2021-06-10
  Filled 2021-06-06: qty 1

## 2021-06-06 MED ORDER — TRAMADOL HCL 50 MG PO TABS
50.0000 mg | ORAL_TABLET | Freq: Four times a day (QID) | ORAL | Status: DC | PRN
  Administered 2021-06-06 – 2021-06-10 (×8): 50 mg via ORAL
  Filled 2021-06-06 (×8): qty 1

## 2021-06-06 MED ORDER — PROCHLORPERAZINE EDISYLATE 10 MG/2ML IJ SOLN: 10.0000 mg | Freq: Four times a day (QID) | INTRAMUSCULAR | Status: DC | PRN

## 2021-06-06 MED ORDER — PROMETHAZINE HCL 25 MG PO TABS: 12.5000 mg | ORAL_TABLET | Freq: Four times a day (QID) | ORAL | Status: DC | PRN

## 2021-06-06 NOTE — Progress Notes (Signed)
Physical Therapy Treatment Patient Details Name: Cynthia [name] MRN: [id number] DOB: 04/14/1940 Today's Date: 06/06/2021   History of Present Illness 81 y.o. female admitted 06/05/21 for R TKA. PMH includes: hypotension, normal pressure hydocephalus, polyneuropathy, limb movement d/o, acoustic neuroma excision.    PT Comments    Pt ambulated 30' with RW, distance limited by nausea and vomiting, vital signs stable. Instructed pt in TKA HEP, she demonstrates good understanding. Will return for second session today to complete gait and stair training.     Recommendations for follow up therapy are one component of a multi-disciplinary discharge planning process, led by the attending physician.  Recommendations may be updated based on patient status, additional functional criteria and insurance authorization.  Follow Up Recommendations  Outpatient PT     Assistance Recommended at Discharge Intermittent Supervision/Assistance  Patient can return home with the following A little help with bathing/dressing/bathroom;Assist for transportation;Help with stairs or ramp for entrance   Equipment Recommendations  None recommended by PT    Recommendations for Other Services       Precautions / Restrictions Precautions Precautions: Knee Precaution Booklet Issued: Yes (comment) Precaution Comments: instructed pt in no pillow under knee Restrictions Weight Bearing Restrictions: No     Mobility  Bed Mobility               General bed mobility comments: up in recliner    Transfers Overall transfer level: Needs assistance Equipment used: Rolling walker (2 wheels) Transfers: Sit to/from Stand Sit to Stand: Min guard           General transfer comment: VCs hand placement    Ambulation/Gait Ambulation/Gait assistance: Min guard Gait Distance (Feet): 30 Feet Assistive device: Rolling walker (2 wheels)   Gait velocity: decr     General Gait Details: VCs sequencing and  positioning in RW, no loss of balance, distance limited by nausea and vomiting, assisted pt to recliner where BP was 140/66, HR 67, SaO2 96% on room air   Stairs             Wheelchair Mobility    Modified Rankin (Stroke Patients Only)       Balance Overall balance assessment: Modified Independent   Sitting balance-Leahy Scale: Good       Standing balance-Leahy Scale: Fair Standing balance comment: steady with RW                            Cognition Arousal/Alertness: Awake/alert Behavior During Therapy: WFL for tasks assessed/performed Overall Cognitive Status: Within Functional Limits for tasks assessed                                          Exercises Total Joint Exercises Ankle Circles/Pumps: AROM, Both, 10 reps, Supine Quad Sets: AROM, Both, 5 reps, Supine Towel Squeeze: AROM, Both, 5 reps, Supine Short Arc Quad: AROM, Right, 5 reps, Supine Heel Slides: AAROM, Right, Supine, 5 reps Hip ABduction/ADduction: AROM, Right, 5 reps, Supine Straight Leg Raises: AROM, Right, 5 reps, Supine Long Arc Quad: AROM, Right, 5 reps, Seated Knee Flexion: AAROM, Right, 5 reps, Seated Goniometric ROM: 0-55* aAROM R knee    General Comments        Pertinent Vitals/Pain Pain Assessment Pain Score: 7  Pain Location: R knee with walking Pain Descriptors / Indicators: Sore Pain Intervention(s): Limited activity within patient's  tolerance, Monitored during session, Premedicated before session, Ice applied    Home Living                          Prior Function            PT Goals (current goals can now be found in the care plan section) Acute Rehab PT Goals Patient Stated Goal: decrease pain, be able to walk PT Goal Formulation: With patient Time For Goal Achievement: 06/12/21 Potential to Achieve Goals: Good Progress towards PT goals: Progressing toward goals    Frequency    7X/week      PT Plan Current plan  remains appropriate    Co-evaluation              AM-PAC PT "6 Clicks" Mobility   Outcome Measure  Help needed turning from your back to your side while in a flat bed without using bedrails?: A Little Help needed moving from lying on your back to sitting on the side of a flat bed without using bedrails?: A Little Help needed moving to and from a bed to a chair (including a wheelchair)?: A Little Help needed standing up from a chair using your arms (e.g., wheelchair or bedside chair)?: A Little Help needed to walk in hospital room?: A Little Help needed climbing 3-5 steps with a railing? : A Lot 6 Click Score: 17    End of Session Equipment Utilized During Treatment: Gait belt Activity Tolerance: Patient tolerated treatment well Patient left: in chair;with call bell/phone within reach;with chair alarm set Nurse Communication: Mobility status PT Visit Diagnosis: Difficulty in walking, not elsewhere classified (R26.2);Pain Pain - Right/Left: Right Pain - part of body: Knee     Time: 5631-4970 PT Time Calculation (min) (ACUTE ONLY): 29 min  Charges:  $Gait Training: 8-22 mins $Therapeutic Exercise: 8-22 mins                     Blondell Reveal Kistler PT 06/06/2021  Acute Rehabilitation Services Pager [phone] Office [phone]

## 2021-06-06 NOTE — Plan of Care (Signed)
°  Problem: Clinical Measurements: Goal: Respiratory complications will improve Outcome: Not Applicable

## 2021-06-06 NOTE — Progress Notes (Addendum)
° ° °  Subjective:  Patient reports pain as mild to moderate.  Denies CP/SOB. Emesis early this am after norco.   Objective:   VITALS:   Vitals:   06/05/21 2030 06/06/21 0140 06/06/21 0542 06/06/21 0945  BP: (!) 107/56 (!) 103/49 (!) 112/51 140/66  Pulse: 67 64 65 67  Resp: 17 16 17 17   Temp: 98.2 F (36.8 C) 97.8 F (36.6 C) 98.1 F (36.7 C)   TempSrc: Oral Oral Oral   SpO2: 95% 95% 95% 96%  Weight:      Height:        NAD ABD soft Sensation intact distally Intact pulses distally Dorsiflexion/Plantar flexion intact Incision: dressing C/D/I Compartment soft   Lab Results  Component Value Date   WBC 15.6 (H) 06/06/2021   HGB 9.5 (L) 06/06/2021   HCT 28.5 (L) 06/06/2021   MCV 96.3 06/06/2021   PLT 145 (L) 06/06/2021   BMET    Component Value Date/Time   NA 137 06/06/2021 0323   K 4.0 06/06/2021 0323   CL 108 06/06/2021 0323   CO2 23 06/06/2021 0323   GLUCOSE 156 (H) 06/06/2021 0323   BUN 24 (H) 06/06/2021 0323   CREATININE 0.69 06/06/2021 0323   CALCIUM 8.6 (L) 06/06/2021 0323   GFRNONAA >60 06/06/2021 0323     Assessment/Plan: 1 Day Post-Op   Principal Problem:   Degenerative arthritis of right knee Active Problems:   Osteoarthritis of right knee   WBAT RLE with walker DVT ppx: Aspirin, SCDs, TEDS PO pain control PT/OT N/V: likely due to narcotics - d/c narcotics & start PRN ultram, antiemetics, observe, bowel regimen Dispo: D/C when when clears therapy and N/V resolved, likely tomorrow vs sunday   Hilton Cork Shellie Goettl 06/06/2021, 10:22 AM   Rod Can, MD [phone] [place] is now [facility]   [place] [address], [address], [place], [place] [address] Phone: [phone] [email or web address] Facebook   [employer]

## 2021-06-06 NOTE — TOC Transition Note (Signed)
Transition of Care [facility]) - CM/SW Discharge Note  Patient Details  Name: Cynthia [name] MRN: [id number] Date of Birth: 03/07/1941  [facility] of [facility]) CM/SW Contact:  Sherie Don, LCSW Phone Number: 06/06/2021, 9:58 AM  Clinical Narrative: Patient is expected to discharge home after working with PT. CSW met with patient to confirm discharge plan. Patient will discharge home with OPPT at [facility] with the first appointment scheduled for 06/09/21. Patient has a rolling walker and elevated toilet at home, so there are no DME needs at this time. TOC signing off.  Final next level of care: OP Rehab Barriers to Discharge: No Barriers Identified  Patient Goals and CMS Choice Patient states their goals for this hospitalization and ongoing recovery are:: Discharge home with OPPT Choice offered to / list presented to : NA  Discharge Plan and Services        DME Arranged: N/A DME Agency: NA  Readmission Risk Interventions No flowsheet data found.

## 2021-06-06 NOTE — Progress Notes (Signed)
Physical Therapy Treatment Patient Details Name: Cynthia [name] MRN: [id number] DOB: October 07, 1940 Today's Date: 06/06/2021   History of Present Illness 81 y.o. female admitted 06/05/21 for R TKA. PMH includes: hypotension, normal pressure hydocephalus, polyneuropathy, limb movement d/o, acoustic neuroma excision.    PT Comments    Pt reports continued nausea and generally feels unwell, she stated she's not able to tolerate getting out of bed at present. She agreed to bed level TKA exercises.    Recommendations for follow up therapy are one component of a multi-disciplinary discharge planning process, led by the attending physician.  Recommendations may be updated based on patient status, additional functional criteria and insurance authorization.  Follow Up Recommendations  Outpatient PT     Assistance Recommended at Discharge Intermittent Supervision/Assistance  Patient can return home with the following A little help with bathing/dressing/bathroom;Assist for transportation;Help with stairs or ramp for entrance   Equipment Recommendations  None recommended by PT    Recommendations for Other Services       Precautions / Restrictions Precautions Precautions: Knee Precaution Booklet Issued: Yes (comment) Precaution Comments: instructed pt in no pillow under knee Restrictions Weight Bearing Restrictions: No           Cognition Arousal/Alertness: Awake/alert Behavior During Therapy: WFL for tasks assessed/performed Overall Cognitive Status: Within Functional Limits for tasks assessed                                          Exercises Total Joint Exercises Ankle Circles/Pumps: AROM, Both, 10 reps, Supine Quad Sets: AROM, Both, 5 reps, Supine Towel Squeeze: AROM, Both, 5 reps, Supine Short Arc Quad: AROM, Right, Supine, 10 reps Heel Slides: AAROM, Right, Supine, 10 reps Hip ABduction/ADduction: AROM, Right, 5 reps, Supine Straight Leg Raises: AROM, Right, 5  reps, Supine     General Comments        Pertinent Vitals/Pain Pain Assessment Pain Score: 7  Pain Location: R knee Pain Descriptors / Indicators: Sore Pain Intervention(s): Limited activity within patient's tolerance, Monitored during session, Premedicated before session, Ice applied, Repositioned    Home Living                          Prior Function            PT Goals (current goals can now be found in the care plan section) Acute Rehab PT Goals Patient Stated Goal: decrease pain, be able to walk PT Goal Formulation: With patient Time For Goal Achievement: 06/12/21 Potential to Achieve Goals: Good Progress towards PT goals: Not progressing toward goals - comment (nausea)    Frequency    7X/week      PT Plan Current plan remains appropriate    Co-evaluation              AM-PAC PT "6 Clicks" Mobility   Outcome Measure  Help needed turning from your back to your side while in a flat bed without using bedrails?: A Little Help needed moving from lying on your back to sitting on the side of a flat bed without using bedrails?: A Little Help needed moving to and from a bed to a chair (including a wheelchair)?: A Little Help needed standing up from a chair using your arms (e.g., wheelchair or bedside chair)?: A Little Help needed to walk in hospital room?: A Little Help needed climbing  3-5 steps with a railing? : A Lot 6 Click Score: 17    End of Session Equipment Utilized During Treatment: Gait belt Activity Tolerance: Patient tolerated treatment well Patient left: with call bell/phone within reach;in bed;with bed alarm set;with family/visitor present Nurse Communication: Mobility status PT Visit Diagnosis: Difficulty in walking, not elsewhere classified (R26.2);Pain Pain - Right/Left: Right Pain - part of body: Knee     Time: 1450-1459 PT Time Calculation (min) (ACUTE ONLY): 9 min  Charges:  $Therapeutic Exercise: 8-22 mins                      Blondell Reveal Kistler PT 06/06/2021  Acute Rehabilitation Services Pager [phone] Office [phone]

## 2021-06-06 NOTE — Progress Notes (Addendum)
Physical Therapy Treatment Patient Details Name: Cynthia [name] MRN: [id number] DOB: Apr 10, 1941 Today's Date: 06/06/2021   History of Present Illness 81 y.o. female admitted 06/05/21 for R TKA. PMH includes: hypotension, normal pressure hydocephalus, polyneuropathy, limb movement d/o, acoustic neuroma excision.    PT Comments    Pt reports she's still somewhat nauseous and doesn't feel she can tolerate getting out of bed. Pt agreed to gentle ROM exercises in bed. Pt's spouse and daughter present and requested order for tramadol for pain control as pt has tolerated this well in the past. RN notified of this request.  Pt is not yet ready to DC home from a PT standpoint due to pain and nausea issues. Will attempt ambulation this afternoon.   Recommendations for follow up therapy are one component of a multi-disciplinary discharge planning process, led by the attending physician.  Recommendations may be updated based on patient status, additional functional criteria and insurance authorization.  Follow Up Recommendations  Outpatient PT     Assistance Recommended at Discharge Intermittent Supervision/Assistance  Patient can return home with the following A little help with bathing/dressing/bathroom;Assist for transportation;Help with stairs or ramp for entrance   Equipment Recommendations  None recommended by PT    Recommendations for Other Services       Precautions / Restrictions Precautions Precautions: Knee Precaution Booklet Issued: Yes (comment) Precaution Comments: instructed pt in no pillow under knee Restrictions Weight Bearing Restrictions: No        Cognition Arousal/Alertness: Awake/alert Behavior During Therapy: WFL for tasks assessed/performed Overall Cognitive Status: Within Functional Limits for tasks assessed                                          Exercises Total Joint Exercises Ankle Circles/Pumps: AROM, Both, 10 reps, Supine    Heel  Slides: AAROM, Right, Supine, 10 reps  Straight Leg Raises: AROM, Right, 5 reps, Supine   Goniometric ROM: 0-55* aAROM R knee    General Comments        Pertinent Vitals/Pain Pain Assessment Pain Score: 7  Pain Location: R knee with walking Pain Descriptors / Indicators: Sore Pain Intervention(s): Limited activity within patient's tolerance, Monitored during session, Premedicated before session, Ice applied, Repositioned    Home Living                          Prior Function            PT Goals (current goals can now be found in the care plan section) Acute Rehab PT Goals Patient Stated Goal: decrease pain, be able to walk PT Goal Formulation: With patient Time For Goal Achievement: 06/12/21 Potential to Achieve Goals: Good Progress towards PT goals: Not progressing toward goals - comment (nausea)    Frequency    7X/week      PT Plan Current plan remains appropriate    Co-evaluation              AM-PAC PT "6 Clicks" Mobility   Outcome Measure  Help needed turning from your back to your side while in a flat bed without using bedrails?: A Little Help needed moving from lying on your back to sitting on the side of a flat bed without using bedrails?: A Little Help needed moving to and from a bed to a chair (including a wheelchair)?: A Little Help needed  standing up from a chair using your arms (e.g., wheelchair or bedside chair)?: A Little Help needed to walk in hospital room?: A Little Help needed climbing 3-5 steps with a railing? : A Lot 6 Click Score: 17    End of Session Equipment Utilized During Treatment: Gait belt Activity Tolerance: Patient tolerated treatment well Patient left: with call bell/phone within reach;in bed;with bed alarm set;with family/visitor present Nurse Communication: Mobility status PT Visit Diagnosis: Difficulty in walking, not elsewhere classified (R26.2);Pain Pain - Right/Left: Right Pain - part of body: Knee      Time: 4458-4835 PT Time Calculation (min) (ACUTE ONLY): 26 min  Charges:  $Therapeutic Exercise: 8-22 mins                     Blondell Reveal Kistler PT 06/06/2021  Acute Rehabilitation Services Pager [phone] Office [phone]

## 2021-06-07 DIAGNOSIS — E0590 Thyrotoxicosis, unspecified without thyrotoxic crisis or storm: Secondary | ICD-10-CM | POA: Diagnosis present

## 2021-06-07 DIAGNOSIS — I484 Atypical atrial flutter: Secondary | ICD-10-CM | POA: Diagnosis present

## 2021-06-07 DIAGNOSIS — I4589 Other specified conduction disorders: Secondary | ICD-10-CM | POA: Diagnosis present

## 2021-06-07 DIAGNOSIS — K219 Gastro-esophageal reflux disease without esophagitis: Secondary | ICD-10-CM | POA: Diagnosis present

## 2021-06-07 DIAGNOSIS — E785 Hyperlipidemia, unspecified: Secondary | ICD-10-CM | POA: Diagnosis present

## 2021-06-07 DIAGNOSIS — I4819 Other persistent atrial fibrillation: Secondary | ICD-10-CM | POA: Diagnosis present

## 2021-06-07 DIAGNOSIS — Z8701 Personal history of pneumonia (recurrent): Secondary | ICD-10-CM | POA: Diagnosis not present

## 2021-06-07 DIAGNOSIS — Z87440 Personal history of urinary (tract) infections: Secondary | ICD-10-CM | POA: Diagnosis not present

## 2021-06-07 DIAGNOSIS — H9190 Unspecified hearing loss, unspecified ear: Secondary | ICD-10-CM | POA: Diagnosis present

## 2021-06-07 DIAGNOSIS — Z90710 Acquired absence of both cervix and uterus: Secondary | ICD-10-CM | POA: Diagnosis not present

## 2021-06-07 DIAGNOSIS — Z79899 Other long term (current) drug therapy: Secondary | ICD-10-CM | POA: Diagnosis not present

## 2021-06-07 DIAGNOSIS — G629 Polyneuropathy, unspecified: Secondary | ICD-10-CM | POA: Diagnosis present

## 2021-06-07 DIAGNOSIS — Z85820 Personal history of malignant melanoma of skin: Secondary | ICD-10-CM | POA: Diagnosis not present

## 2021-06-07 DIAGNOSIS — I495 Sick sinus syndrome: Secondary | ICD-10-CM | POA: Diagnosis present

## 2021-06-07 DIAGNOSIS — M1711 Unilateral primary osteoarthritis, right knee: Secondary | ICD-10-CM | POA: Diagnosis present

## 2021-06-07 DIAGNOSIS — Z87891 Personal history of nicotine dependence: Secondary | ICD-10-CM | POA: Diagnosis not present

## 2021-06-07 DIAGNOSIS — I440 Atrioventricular block, first degree: Secondary | ICD-10-CM | POA: Diagnosis not present

## 2021-06-07 DIAGNOSIS — Z79890 Hormone replacement therapy (postmenopausal): Secondary | ICD-10-CM | POA: Diagnosis not present

## 2021-06-07 DIAGNOSIS — Z7901 Long term (current) use of anticoagulants: Secondary | ICD-10-CM | POA: Diagnosis not present

## 2021-06-07 DIAGNOSIS — I10 Essential (primary) hypertension: Secondary | ICD-10-CM | POA: Diagnosis present

## 2021-06-07 DIAGNOSIS — Y92230 Patient room in hospital as the place of occurrence of the external cause: Secondary | ICD-10-CM | POA: Diagnosis not present

## 2021-06-07 DIAGNOSIS — R112 Nausea with vomiting, unspecified: Secondary | ICD-10-CM | POA: Diagnosis not present

## 2021-06-07 DIAGNOSIS — G4733 Obstructive sleep apnea (adult) (pediatric): Secondary | ICD-10-CM | POA: Diagnosis present

## 2021-06-07 DIAGNOSIS — Z95818 Presence of other cardiac implants and grafts: Secondary | ICD-10-CM | POA: Diagnosis not present

## 2021-06-07 DIAGNOSIS — T40605A Adverse effect of unspecified narcotics, initial encounter: Secondary | ICD-10-CM | POA: Diagnosis not present

## 2021-06-07 LAB — CBC
HCT: 26.3 % — ABNORMAL LOW (ref 36.0–46.0)
Hemoglobin: 8.7 g/dL — ABNORMAL LOW (ref 12.0–15.0)
MCH: 31.6 pg (ref 26.0–34.0)
MCHC: 33.1 g/dL (ref 30.0–36.0)
MCV: 95.6 fL (ref 80.0–100.0)
Platelets: 136 10*3/uL — ABNORMAL LOW (ref 150–400)
RBC: 2.75 MIL/uL — ABNORMAL LOW (ref 3.87–5.11)
RDW: 13.2 % (ref 11.5–15.5)
WBC: 14.3 10*3/uL — ABNORMAL HIGH (ref 4.0–10.5)
nRBC: 0 % (ref 0.0–0.2)

## 2021-06-07 MED ORDER — LIP MEDEX EX OINT
TOPICAL_OINTMENT | CUTANEOUS | Status: DC | PRN
  Administered 2021-06-08: 1 via TOPICAL
  Filled 2021-06-07: qty 7

## 2021-06-07 NOTE — Plan of Care (Signed)
°  Problem: Pain Managment: Goal: General experience of comfort will improve Outcome: Progressing   Problem: Education: Goal: Knowledge of the prescribed therapeutic regimen will improve Outcome: Progressing

## 2021-06-07 NOTE — Progress Notes (Signed)
° °  Subjective:  Cynthia [name] is a 81 y.o. female, 2 Days Post-Op    s/p Procedure(s): COMPUTER ASSISTED TOTAL KNEE ARTHROPLASTY   Patient reports pain as mild to moderate.  Patient reports that pain has been manageable with ice and tramadol.  Reports she continues to be nauseous and is going to slowly try to advance her diet.  Denies numbness or tingling.  Denies fever or chills.  Does report she feels weaker.  No shortness of breath.  Positive urination, positive flatus  Objective:   VITALS:   Vitals:   06/06/21 1130 06/06/21 1546 06/06/21 2115 06/07/21 0459  BP: (!) 117/52 133/63 (!) 154/76 (!) 161/77  Pulse: 66 70 76 96  Resp: 14 17 16 16   Temp: 97.8 F (36.6 C) 98.4 F (36.9 C) 99.5 F (37.5 C) 99.2 F (37.3 C)  TempSrc: Oral  Oral Oral  SpO2: 91% 92% 91% 90%  Weight:      Height:       No acute distress   ABD soft Neurovascular intact Sensation intact distally Intact pulses distally Dorsiflexion/Plantar flexion intact Incision: dressing C/D/I Compartments soft Abdomen is soft and nondistended, bilateral calfs are soft and supple.  Dressing is intact.  2+ DP pulse.  Lab Results  Component Value Date   WBC 14.3 (H) 06/07/2021   HGB 8.7 (L) 06/07/2021   HCT 26.3 (L) 06/07/2021   MCV 95.6 06/07/2021   PLT 136 (L) 06/07/2021   BMET    Component Value Date/Time   NA 137 06/06/2021 0323   K 4.0 06/06/2021 0323   CL 108 06/06/2021 0323   CO2 23 06/06/2021 0323   GLUCOSE 156 (H) 06/06/2021 0323   BUN 24 (H) 06/06/2021 0323   CREATININE 0.69 06/06/2021 0323   CALCIUM 8.6 (L) 06/06/2021 0323   GFRNONAA >60 06/06/2021 0323     Assessment/Plan: 2 Days Post-Op   Principal Problem:   Degenerative arthritis of right knee Active Problems:   Osteoarthritis of right knee   Advance diet Up with therapy Nausea /vomiting: Continue to hold stronger opioids and continue tramadol for pain as well as antiemetics  Hemoglobin 8.7   Weightbearing Status:  DVT  Prophylaxis: Aspirin, SCDs, teds Dispo: DC once patient clears therapy and nausea and vomiting is resolved   Faythe Casa 06/07/2021, 6:57 AM  Cynthia Sidle PA-C  Physician Assistant with Dr. Lillia Abed [facility] [facility]

## 2021-06-07 NOTE — Progress Notes (Signed)
Physical Therapy Treatment Patient Details Name: Cynthia [name] MRN: [id number] DOB: [birth year] Today's Date: 06/07/2021   History of Present Illness 81 y.o. female admitted 06/05/21 for R TKA. PMH includes: hypotension, normal pressure hydocephalus, polyneuropathy, limb movement d/o, acoustic neuroma excision.    PT Comments    Assisted pt out of bathroom. Pt felt like she wanted to try to walk a little more. She only made it ~20 feet before she reported feeling "sweaty.......a little nauseous...Marland KitchenMarland Kitchena little dizzy". Assisted pt into chair and wheeled her back to her room at her request. Repeated cueing required for safe mobility this session. Will continue to progress activity as pt is able to tolerate.    Recommendations for follow up therapy are one component of a multi-disciplinary discharge planning process, led by the attending physician.  Recommendations may be updated based on patient status, additional functional criteria and insurance authorization.  Follow Up Recommendations  Follow physician's recommendations for discharge plan and follow up therapies     Assistance Recommended at Discharge Frequent or constant Supervision/Assistance  Patient can return home with the following A little help with bathing/dressing/bathroom;Assist for transportation;Help with stairs or ramp for entrance;Assistance with cooking/housework;A little help with walking and/or transfers   Equipment Recommendations  None recommended by PT    Recommendations for Other Services       Precautions / Restrictions Precautions Precautions: Fall;Knee Precaution Comments: instructed pt in no pillow under knee and to try to keep knee straight when at rest (in bed, in recliner) Restrictions Weight Bearing Restrictions: No Other Position/Activity Restrictions: WBAT     Mobility  Bed Mobility Overal bed mobility: Needs Assistance Bed Mobility: Supine to Sit, Sit to Supine     Supine to sit: Min guard, HOB  elevated Sit to supine: Min guard, HOB elevated   General bed mobility comments: Pt used gait belt to assist R LE off bed. Increased time.    Transfers Overall transfer level: Needs assistance Equipment used: Rolling walker (2 wheels) Transfers: Sit to/from Stand Sit to Stand: Min assist           General transfer comment: Cues for safety, technique, hand/LE placement. Assist to rise, steady, control descent.    Ambulation/Gait Ambulation/Gait assistance: Min assist Gait Distance (Feet): 20 Feet Assistive device: Rolling walker (2 wheels) Gait Pattern/deviations: Step-to pattern, Step-through pattern, Decreased stride length, Trunk flexed       General Gait Details: Cues for posture, RW proximity. Assist to steady intermittently. Had to sit after ~20 feet 2* pt reporting she felt sweaty, a little nauseous, a little dizzy. Pt was rolled back to the room this time at her request.   Stairs             Wheelchair Mobility    Modified Rankin (Stroke Patients Only)       Balance Overall balance assessment: Needs assistance         Standing balance support: Bilateral upper extremity supported, Reliant on assistive device for balance, During functional activity Standing balance-Leahy Scale: Poor                              Cognition Arousal/Alertness: Awake/alert Behavior During Therapy: WFL for tasks assessed/performed Overall Cognitive Status: Within Functional Limits for tasks assessed Area of Impairment: Problem solving  Problem Solving: Requires verbal cues General Comments: Repeated cueing required.        Exercises Total Joint Exercises Quad Sets: AROM, Right, 5 reps, Seated    General Comments        Pertinent Vitals/Pain Pain Assessment Pain Assessment: 0-10 Pain Score: 6  Pain Location: R knee Pain Descriptors / Indicators: Discomfort, Sore Pain Intervention(s): Limited activity  within patient's tolerance, Monitored during session, Repositioned, Ice applied    Home Living Family/patient expects to be discharged to:: Private residence Living Arrangements: Spouse/significant other                      Prior Function            PT Goals (current goals can now be found in the care plan section) Progress towards PT goals: Progressing toward goals    Frequency    7X/week      PT Plan Current plan remains appropriate    Co-evaluation              AM-PAC PT "6 Clicks" Mobility   Outcome Measure  Help needed turning from your back to your side while in a flat bed without using bedrails?: A Little Help needed moving from lying on your back to sitting on the side of a flat bed without using bedrails?: A Little Help needed moving to and from a bed to a chair (including a wheelchair)?: A Little Help needed standing up from a chair using your arms (e.g., wheelchair or bedside chair)?: A Little Help needed to walk in hospital room?: A Little Help needed climbing 3-5 steps with a railing? : A Lot 6 Click Score: 17    End of Session Equipment Utilized During Treatment: Gait belt Activity Tolerance: Patient limited by fatigue Patient left: in chair;with call bell/phone within reach;with chair alarm set   PT Visit Diagnosis: Difficulty in walking, not elsewhere classified (R26.2);Pain Pain - Right/Left: Right Pain - part of body: Knee     Time: 1941-7408 PT Time Calculation (min) (ACUTE ONLY): 15 min  Charges:  $Gait Training: 8-22 mins                        Doreatha Massed, PT Acute Rehabilitation  Office: [phone] Pager: [phone]

## 2021-06-07 NOTE — Plan of Care (Signed)
  Problem: Pain Managment: Goal: General experience of comfort will improve Outcome: Progressing   Problem: Activity: Goal: Risk for activity intolerance will decrease Outcome: Progressing   

## 2021-06-07 NOTE — Progress Notes (Signed)
Physical Therapy Treatment Patient Details Name: Cynthia [name] MRN: [id number] DOB: [birth year] Today's Date: 06/07/2021   History of Present Illness 81 y.o. female admitted 06/05/21 for R TKA. PMH includes: hypotension, normal pressure hydocephalus, polyneuropathy, limb movement d/o, acoustic neuroma excision.    PT Comments    Progressing slowly with mobility. Mobility remains limited by nausea. Will continue to progress activity as pt is able to tolerated. Potential d/c plan for tomorrow.    Recommendations for follow up therapy are one component of a multi-disciplinary discharge planning process, led by the attending physician.  Recommendations may be updated based on patient status, additional functional criteria and insurance authorization.  Follow Up Recommendations  Follow physician's recommendations for discharge plan and follow up therapies (plan is for OP PT)     Assistance Recommended at Discharge Frequent or constant Supervision/Assistance  Patient can return home with the following A little help with bathing/dressing/bathroom;Assist for transportation;Help with stairs or ramp for entrance;Assistance with cooking/housework;A little help with walking and/or transfers   Equipment Recommendations  None recommended by PT    Recommendations for Other Services       Precautions / Restrictions Precautions Precautions: Knee;Fall Restrictions Weight Bearing Restrictions: No Other Position/Activity Restrictions: WBAT     Mobility  Bed Mobility Overal bed mobility: Needs Assistance Bed Mobility: Supine to Sit     Supine to sit: Min guard, HOB elevated     General bed mobility comments: Pt used gait belt to assist R LE off bed. Increased time.    Transfers Overall transfer level: Needs assistance Equipment used: Rolling walker (2 wheels) Transfers: Sit to/from Stand, Bed to chair/wheelchair/BSC Sit to Stand: Min guard, From elevated surface           General  transfer comment: Cues for safety, technique, hand/LE placement. Step pivot over to recliner using RW. Mobility limited 2* dizziness.    Ambulation/Gait               General Gait Details: Did not attempt this session 2* pt's breakfast arrived and pt also experiencing nausea.   Stairs             Wheelchair Mobility    Modified Rankin (Stroke Patients Only)       Balance Overall balance assessment: Needs assistance         Standing balance support: Bilateral upper extremity supported Standing balance-Leahy Scale: Fair                              Cognition Arousal/Alertness: Awake/alert Behavior During Therapy: WFL for tasks assessed/performed Overall Cognitive Status: Within Functional Limits for tasks assessed                                          Exercises Total Joint Exercises Quad Sets: AROM, Right, 5 reps, Seated    General Comments        Pertinent Vitals/Pain Pain Assessment Pain Assessment: 0-10 Pain Score: 7  Pain Location: R knee Pain Descriptors / Indicators: Discomfort, Sore Pain Intervention(s): Limited activity within patient's tolerance, Monitored during session, Ice applied, Repositioned    Home Living                          Prior Function  PT Goals (current goals can now be found in the care plan section) Progress towards PT goals: Progressing toward goals (but still limited by nauea)    Frequency    7X/week      PT Plan Current plan remains appropriate    Co-evaluation              AM-PAC PT "6 Clicks" Mobility   Outcome Measure  Help needed turning from your back to your side while in a flat bed without using bedrails?: A Little Help needed moving from lying on your back to sitting on the side of a flat bed without using bedrails?: A Little Help needed moving to and from a bed to a chair (including a wheelchair)?: A Little Help needed standing up  from a chair using your arms (e.g., wheelchair or bedside chair)?: A Little Help needed to walk in hospital room?: A Little Help needed climbing 3-5 steps with a railing? : A Little 6 Click Score: 18    End of Session Equipment Utilized During Treatment: Gait belt Activity Tolerance: Patient tolerated treatment well Patient left: in chair;with call bell/phone within reach;with chair alarm set   PT Visit Diagnosis: Difficulty in walking, not elsewhere classified (R26.2);Pain Pain - Right/Left: Right Pain - part of body: Knee     Time: 7893-8101 PT Time Calculation (min) (ACUTE ONLY): 11 min  Charges:  $Gait Training: 8-22 mins                        Doreatha Massed, PT Acute Rehabilitation  Office: [phone] Pager: [phone]

## 2021-06-07 NOTE — Progress Notes (Signed)
Pt placed on 2lnc due to low 02 sat of 86-88 percent on room air. Pt is not short of breath with these findings. She states she just feels weak and tired overall. Her lung sounds remain unchanged. Rn encouraged her to use her incentive spirometer as well. Pt remains stable. Rn will continue to monitor.

## 2021-06-07 NOTE — Progress Notes (Signed)
Pt has eaten better throughout the day though amounts remain small. Pt reports much improvement with nausea today. Pt continues to be very weak overall need many cues to safely ambulate in room with walker. Family is concerned that pt will need home health versus outpatient physical therapy due to memory issues that are present. Rn will continue to monitor.

## 2021-06-07 NOTE — Progress Notes (Signed)
Physical Therapy Treatment Patient Details Name: Cynthia [name] MRN: [id number] DOB: Jun 22, 1940 Today's Date: 06/07/2021   History of Present Illness 81 y.o. female admitted 06/05/21 for R TKA. PMH includes: hypotension, normal pressure hydocephalus, polyneuropathy, limb movement d/o, acoustic neuroma excision.    PT Comments    Able to ambulate this session. Pt walked ~50 feet before she reported feeling sweaty and a little nauseous. After a brief seated rest break, she was able to complete the walk back to her room. Pt appeared a little less sharp this afternoon compared to this morning. Will continue PT tomorrow with plan to work on stair negotiation.    Recommendations for follow up therapy are one component of a multi-disciplinary discharge planning process, led by the attending physician.  Recommendations may be updated based on patient status, additional functional criteria and insurance authorization.  Follow Up Recommendations  Follow physician's recommendations for discharge plan and follow up therapies (plan is for OPPT)     Assistance Recommended at Discharge Frequent or constant Supervision/Assistance  Patient can return home with the following A little help with bathing/dressing/bathroom;Assist for transportation;Help with stairs or ramp for entrance;Assistance with cooking/housework;A little help with walking and/or transfers   Equipment Recommendations  None recommended by PT    Recommendations for Other Services       Precautions / Restrictions Precautions Precautions: Knee;Fall Precaution Comments: instructed pt in no pillow under knee and to try to keep knee straight when at rest (in bed, in recliner) Restrictions Weight Bearing Restrictions: No Other Position/Activity Restrictions: WBAT     Mobility  Bed Mobility Overal bed mobility: Needs Assistance Bed Mobility: Supine to Sit, Sit to Supine     Supine to sit: Min guard, HOB elevated Sit to supine: Min  guard, HOB elevated   General bed mobility comments: Pt used gait belt to assist R LE off bed. Increased time.    Transfers Overall transfer level: Needs assistance Equipment used: Rolling walker (2 wheels) Transfers: Sit to/from Stand Sit to Stand: Min assist           General transfer comment: Cues for safety, technique, hand/LE placement. Assist to rise, steady, control descent.    Ambulation/Gait Ambulation/Gait assistance: Min assist Gait Distance (Feet): 60 Feet Assistive device: Rolling walker (2 wheels) Gait Pattern/deviations: Step-to pattern, Step-through pattern, Decreased stride length, Trunk flexed       General Gait Details: Cues for posture, RW proximity. Assist to steady intermittently. Had to sit after ~50 feet 2* pt reporting she felt sweaty and a little nauseous. After a brief rest, she was able to continue walking back to her room   Stairs             Wheelchair Mobility    Modified Rankin (Stroke Patients Only)       Balance Overall balance assessment: Needs assistance         Standing balance support: Bilateral upper extremity supported Standing balance-Leahy Scale: Fair                              Cognition Arousal/Alertness: Awake/alert Behavior During Therapy: WFL for tasks assessed/performed Overall Cognitive Status: Within Functional Limits for tasks assessed                                 General Comments: seemed a little less shart this afternoon compared to this morning  Exercises Total Joint Exercises Quad Sets: AROM, Right, 5 reps, Seated    General Comments        Pertinent Vitals/Pain Pain Assessment Pain Assessment: 0-10 Pain Score: 6  Pain Location: R knee Pain Descriptors / Indicators: Discomfort, Sore Pain Intervention(s): Limited activity within patient's tolerance, Monitored during session, Repositioned    Home Living                          Prior  Function            PT Goals (current goals can now be found in the care plan section) Progress towards PT goals: Progressing toward goals    Frequency    7X/week      PT Plan Current plan remains appropriate    Co-evaluation              AM-PAC PT "6 Clicks" Mobility   Outcome Measure  Help needed turning from your back to your side while in a flat bed without using bedrails?: A Little Help needed moving from lying on your back to sitting on the side of a flat bed without using bedrails?: A Little Help needed moving to and from a bed to a chair (including a wheelchair)?: A Little Help needed standing up from a chair using your arms (e.g., wheelchair or bedside chair)?: A Little Help needed to walk in hospital room?: A Little Help needed climbing 3-5 steps with a railing? : A Little 6 Click Score: 18    End of Session Equipment Utilized During Treatment: Gait belt Activity Tolerance: Patient tolerated treatment well Patient left: in bed;with call bell/phone within reach;with bed alarm set   PT Visit Diagnosis: Difficulty in walking, not elsewhere classified (R26.2);Pain Pain - Right/Left: Right Pain - part of body: Knee     Time: 5329-9242 PT Time Calculation (min) (ACUTE ONLY): 15 min  Charges:  $Gait Training: 8-22 mins                         Doreatha Massed, PT Acute Rehabilitation  Office: [phone] Pager: [phone]

## 2021-06-08 LAB — CBC
HCT: 26.5 % — ABNORMAL LOW (ref 36.0–46.0)
Hemoglobin: 8.9 g/dL — ABNORMAL LOW (ref 12.0–15.0)
MCH: 32 pg (ref 26.0–34.0)
MCHC: 33.6 g/dL (ref 30.0–36.0)
MCV: 95.3 fL (ref 80.0–100.0)
Platelets: 141 10*3/uL — ABNORMAL LOW (ref 150–400)
RBC: 2.78 MIL/uL — ABNORMAL LOW (ref 3.87–5.11)
RDW: 13 % (ref 11.5–15.5)
WBC: 12.4 10*3/uL — ABNORMAL HIGH (ref 4.0–10.5)
nRBC: 0.2 % (ref 0.0–0.2)

## 2021-06-08 MED ORDER — METOPROLOL SUCCINATE ER 25 MG PO TB24
25.0000 mg | ORAL_TABLET | Freq: Two times a day (BID) | ORAL | Status: DC
Start: 2021-06-08 — End: 2021-06-08

## 2021-06-08 MED ORDER — METOPROLOL SUCCINATE ER 25 MG PO TB24
25.0000 mg | ORAL_TABLET | Freq: Two times a day (BID) | ORAL | Status: DC
  Administered 2021-06-08 – 2021-06-10 (×4): 25 mg via ORAL
  Filled 2021-06-08 (×5): qty 1

## 2021-06-08 MED ORDER — METOCLOPRAMIDE HCL 5 MG PO TABS: 5.0000 mg | ORAL_TABLET | Freq: Three times a day (TID) | ORAL | 0 refills | Status: DC | PRN

## 2021-06-08 MED ORDER — METHOCARBAMOL 500 MG PO TABS: 500.0000 mg | ORAL_TABLET | Freq: Four times a day (QID) | ORAL | 0 refills | Status: DC | PRN

## 2021-06-08 MED ORDER — ALPRAZOLAM 0.25 MG PO TABS
0.2500 mg | ORAL_TABLET | Freq: Once | ORAL | Status: AC
  Administered 2021-06-08: 0.25 mg via ORAL
  Filled 2021-06-08: qty 1

## 2021-06-08 MED ORDER — TRAMADOL HCL 50 MG PO TABS: 50.0000 mg | ORAL_TABLET | Freq: Four times a day (QID) | ORAL | 0 refills | Status: DC | PRN

## 2021-06-08 NOTE — Progress Notes (Signed)
Physical Therapy Treatment Patient Details Name: Cynthia [name] MRN: [id number] DOB: 02-06-1941 Today's Date: 06/08/2021   History of Present Illness 81 y.o. female admitted 06/05/21 for R TKA. PMH includes: hypotension, normal pressure hydocephalus, polyneuropathy, limb movement d/o, acoustic neuroma excision.    PT Comments    Pt continues to exhibit poor safety awareness and short term memory. She requires repeated cueing for mobility tasks. She has only been able to walk ~20 feet before needing to sit down. She is at risk for falls when ambulating. She has not been able to meet her PT goals on today and thus is not safe to d/c home.    Recommendations for follow up therapy are one component of a multi-disciplinary discharge planning process, led by the attending physician.  Recommendations may be updated based on patient status, additional functional criteria and insurance authorization.  Follow Up Recommendations  Follow physician's recommendations for discharge plan and follow up therapies (may need to consider HHPT. Pt's husband is requesting nursing assistance in home as well)     Assistance Recommended at Discharge Frequent or constant Supervision/Assistance  Patient can return home with the following A little help with walking and/or transfers;A little help with bathing/dressing/bathroom;Assist for transportation;Assistance with cooking/housework;Help with stairs or ramp for entrance   Equipment Recommendations  None recommended by PT    Recommendations for Other Services       Precautions / Restrictions Precautions Precautions: Fall;Knee Precaution Comments: instructed pt in no pillow under knee and to try to keep knee straight when at rest (in bed, in recliner) Restrictions Weight Bearing Restrictions: No RLE Weight Bearing: Weight bearing as tolerated Other Position/Activity Restrictions: WBAT     Mobility  Bed Mobility Overal bed mobility: Needs Assistance Bed  Mobility: Sit to Supine      Sit to supine: Min assist   General bed mobility comments: Increased time. Assist for trunk and R LE positioning. Pt did use gait belt as a leg lifter with cueing.    Transfers Overall transfer level: Needs assistance Equipment used: Rolling walker (2 wheels) Transfers: Sit to/from Stand Sit to Stand: Min assist           General transfer comment: Repeatd cueing for safety, technique, hand/LE placement. Assist to rise, steady, control descent.    Ambulation/Gait Ambulation/Gait assistance: Min assist, +2 safety/equipment Gait Distance (Feet): 20 Feet (x2) Assistive device: Rolling walker (2 wheels) Gait Pattern/deviations: Step-to pattern, Step-through pattern, Decreased stride length, Trunk flexed       General Gait Details: Repeated cueing for posture, RW proximity, proper use of RW. Assist to steady throughout short distance. Had to sit after ~20 feet 2* pt reporting she fatigue, weakness, UE discomfort.  Pt was rolled back to the room in recliner. She is at risk for falls when ambulating.   Stairs             Wheelchair Mobility    Modified Rankin (Stroke Patients Only)       Balance Overall balance assessment: Needs assistance         Standing balance support: Bilateral upper extremity supported, Reliant on assistive device for balance, During functional activity Standing balance-Leahy Scale: Poor                              Cognition Arousal/Alertness: Awake/alert Behavior During Therapy: WFL for tasks assessed/performed Overall Cognitive Status: Within Functional Limits for tasks assessed Area of Impairment: Problem solving, Safety/judgement,  Memory                     Memory: Decreased short-term memory   Safety/Judgement: Decreased awareness of safety   Problem Solving: Requires verbal cues, Slow processing General Comments: Repeated cueing required.        Exercises Total Joint  Exercises Ankle Circles/Pumps: AROM, Both, 10 reps Quad Sets: Right, 10 reps, AAROM Hip ABduction/ADduction: AAROM, Right, 10 reps Straight Leg Raises: AAROM, AROM, Right, 10 reps Goniometric ROM: ~15-70 degrees    General Comments        Pertinent Vitals/Pain Pain Assessment Pain Assessment: Faces Faces Pain Scale: Hurts little more Pain Location: R knee Pain Descriptors / Indicators: Discomfort, Sore Pain Intervention(s): Limited activity within patient's tolerance, Monitored during session, Repositioned    Home Living                          Prior Function            PT Goals (current goals can now be found in the care plan section) Progress towards PT goals: Progressing toward goals    Frequency    7X/week      PT Plan Current plan remains appropriate    Co-evaluation              AM-PAC PT "6 Clicks" Mobility   Outcome Measure  Help needed turning from your back to your side while in a flat bed without using bedrails?: A Little Help needed moving from lying on your back to sitting on the side of a flat bed without using bedrails?: A Little Help needed moving to and from a bed to a chair (including a wheelchair)?: A Little Help needed standing up from a chair using your arms (e.g., wheelchair or bedside chair)?: A Little Help needed to walk in hospital room?: A Lot Help needed climbing 3-5 steps with a railing? : A Lot 6 Click Score: 16    End of Session Equipment Utilized During Treatment: Gait belt Activity Tolerance: Patient limited by fatigue Patient left: in bed;with call bell/phone within reach;with family/visitor present;with bed alarm set   PT Visit Diagnosis: Difficulty in walking, not elsewhere classified (R26.2);Pain Pain - Right/Left: Right Pain - part of body: Knee     Time: 1401-1433 PT Time Calculation (min) (ACUTE ONLY): 32 min  Charges:  $Gait Training: 23-37 mins $Therapeutic Exercise: 8-22 mins                         Doreatha Massed, PT Acute Rehabilitation  Office: [phone] Pager: [phone]

## 2021-06-08 NOTE — Progress Notes (Signed)
Pt has intermittent confusion while talking to daughter over the phone, frustrated with not knowing what and how to order for lunch. No other symptoms were reported by pt. VS was taken and HR was high, pt has Afib.  EKG was done and PA was called. Xanax was ordered over the phone, HR is lower now. Will continue to monitor pt.  06/08/21 1307  Assess: MEWS Score  BP 135/81  Pulse Rate (!) 114  Resp 18  SpO2 (!) 88 %  O2 Device Room Air  Assess: MEWS Score  MEWS Temp 0  MEWS Systolic 0  MEWS Pulse 2  MEWS RR 0  MEWS LOC 0  MEWS Score 2  MEWS Score Color Yellow  Assess: if the MEWS score is Yellow or Red  Were vital signs taken at a resting state? Yes  Focused Assessment No change from prior assessment  Does the patient meet 2 or more of the SIRS criteria? No  MEWS guidelines implemented *See Row Information* No, other (Comment) (repeat VS.)  Treat  Pain Scale 0-10  Pain Score 0  Escalate  MEWS: Escalate Yellow: discuss with charge nurse/RN and consider discussing with provider and RRT  Notify: Charge Nurse/RN  Name of Charge Nurse/RN Notified Charge  Date Charge Nurse/RN Notified 06/08/21  Time Charge Nurse/RN Notified 1308  Notify: Provider  Provider Name/Title Haus PA  Date Provider Notified 06/08/21  Time Provider Notified 1310  Notification Type Call  Notification Reason Other (Comment) (hifgh HR)  Provider response See new orders (EKG. Xanax given.)  Date of Provider Response 06/08/21  Time of Provider Response 1310  Assess: SIRS CRITERIA  SIRS Temperature  0  SIRS Pulse 1  SIRS Respirations  0  SIRS WBC 1  SIRS Score Sum  2

## 2021-06-08 NOTE — Progress Notes (Addendum)
Pt alert and resting in bed, vital signs taken per protocol. HR continues to be elevated, Pt denies chest pain or SOB. Leanne-PA made aware. New orders received.    06/08/21 1505  Vitals  Temp 98.8 F (37.1 C)  Temp Source Oral  BP (!) 142/92  MAP (mmHg) 106  BP Location Left Arm  BP Method Automatic  Patient Position (if appropriate) Lying  Pulse Rate (!) 146  Pulse Rate Source Monitor  Resp 17  Level of Consciousness  Level of Consciousness Alert  MEWS COLOR  MEWS Score Color Yellow  Oxygen Therapy  SpO2 91 %  O2 Device Nasal Cannula  O2 Flow Rate (L/min) 3 L/min  Patient Activity (if Appropriate) In bed  Pulse Oximetry Type Intermittent  Pain Assessment  Pain Scale 0-10  Pain Score 7  Pain Type Surgical pain  Pain Location Knee  Pain Orientation Right  Pain Descriptors / Indicators Aching  Pain Frequency Intermittent  Pain Onset With Activity  Pain Intervention(s) Medication (See eMAR)  MEWS Score  MEWS Temp 0  MEWS Systolic 0  MEWS Pulse 3  MEWS RR 0  MEWS LOC 0  MEWS Score 3

## 2021-06-08 NOTE — Progress Notes (Signed)
Received call from nursing staff, patient HR elevated to 130-140. EKG done showing sin. Tach with 1st degree AV Block. Cardiology called and they recommended increase Metoprolol 25 mg BID. They will come see patient tomorrow when she is still in hospital. I spoke with patients Cardiologist Dr. Einar Gip.

## 2021-06-08 NOTE — Plan of Care (Signed)
°  Problem: Pain Managment: Goal: General experience of comfort will improve Outcome: Progressing   Problem: Education: Goal: Knowledge of the prescribed therapeutic regimen will improve Outcome: Progressing

## 2021-06-08 NOTE — Progress Notes (Signed)
Physical Therapy Treatment Patient Details Name: Cynthia [name] MRN: [id number] DOB: 12-Feb-1941 Today's Date: 06/08/2021   History of Present Illness 81 y.o. female admitted 06/05/21 for R TKA. PMH includes: hypotension, normal pressure hydocephalus, polyneuropathy, limb movement d/o, acoustic neuroma excision.    PT Comments    Pt continues to exhibit poor safety awareness, impaired memory. She requires repeated cueing throughout session. She reports feeling "woozy" and "tired" after only walking about 15 feet. BP 107/52. Will continue to assess readiness for d/c home later today however I do not anticipate her meeting her PT goals on today.     Recommendations for follow up therapy are one component of a multi-disciplinary discharge planning process, led by the attending physician.  Recommendations may be updated based on patient status, additional functional criteria and insurance authorization.  Follow Up Recommendations  Follow physician's recommendations for discharge plan and follow up therapies (may need to consider HHPT instead)     Assistance Recommended at Discharge Frequent or constant Supervision/Assistance  Patient can return home with the following A little help with walking and/or transfers;A little help with bathing/dressing/bathroom;Assist for transportation;Assistance with cooking/housework;Help with stairs or ramp for entrance   Equipment Recommendations  None recommended by PT    Recommendations for Other Services       Precautions / Restrictions Precautions Precautions: Fall;Knee Precaution Comments: instructed pt in no pillow under knee and to try to keep knee straight when at rest (in bed, in recliner) Restrictions Weight Bearing Restrictions: No RLE Weight Bearing: Weight bearing as tolerated Other Position/Activity Restrictions: WBAT     Mobility  Bed Mobility Overal bed mobility: Needs Assistance Bed Mobility: Supine to Sit     Supine to sit: Min  guard, HOB elevated     General bed mobility comments: Min guard for safety. Increased time.    Transfers Overall transfer level: Needs assistance Equipment used: Rolling walker (2 wheels) Transfers: Sit to/from Stand Sit to Stand: Min assist           General transfer comment: Cues for safety, technique, hand/LE placement. Assist to rise, steady, control descent.    Ambulation/Gait Ambulation/Gait assistance: Min assist, +2 safety/equipment Gait Distance (Feet): 15 Feet (x2) Assistive device: Rolling walker (2 wheels) Gait Pattern/deviations: Step-to pattern, Step-through pattern, Decreased stride length, Trunk flexed       General Gait Details: Cues for posture, RW proximity. Assist to steady intermittently. Had to sit after ~15 feet 2* pt reporting she felt woozy. BP 107/52.  Pt was rolled back to the room in recliner. She reports feeling "woozy" and "tired" with ambulation.   Stairs             Wheelchair Mobility    Modified Rankin (Stroke Patients Only)       Balance Overall balance assessment: Needs assistance         Standing balance support: Bilateral upper extremity supported, Reliant on assistive device for balance, During functional activity Standing balance-Leahy Scale: Poor                              Cognition Arousal/Alertness: Awake/alert Behavior During Therapy: WFL for tasks assessed/performed Overall Cognitive Status: Within Functional Limits for tasks assessed Area of Impairment: Problem solving, Safety/judgement                         Safety/Judgement: Decreased awareness of safety   Problem Solving: Requires verbal cues,  Slow processing General Comments: Repeated cueing required.        Exercises Total Joint Exercises Ankle Circles/Pumps: AROM, Both, 10 reps Quad Sets: Right, 10 reps, AAROM Hip ABduction/ADduction: AAROM, Right, 10 reps Straight Leg Raises: AAROM, AROM, Right, 10 reps Goniometric  ROM: ~15-70 degrees    General Comments        Pertinent Vitals/Pain Pain Assessment Pain Assessment: Faces Faces Pain Scale: Hurts little more Pain Location: R knee Pain Descriptors / Indicators: Discomfort, Sore Pain Intervention(s): Limited activity within patient's tolerance, Monitored during session, Ice applied    Home Living                          Prior Function            PT Goals (current goals can now be found in the care plan section) Progress towards PT goals: Progressing toward goals    Frequency    7X/week      PT Plan Current plan remains appropriate    Co-evaluation              AM-PAC PT "6 Clicks" Mobility   Outcome Measure  Help needed turning from your back to your side while in a flat bed without using bedrails?: A Little Help needed moving from lying on your back to sitting on the side of a flat bed without using bedrails?: A Little Help needed moving to and from a bed to a chair (including a wheelchair)?: A Little Help needed standing up from a chair using your arms (e.g., wheelchair or bedside chair)?: A Little Help needed to walk in hospital room?: A Little Help needed climbing 3-5 steps with a railing? : A Lot 6 Click Score: 17    End of Session Equipment Utilized During Treatment: Gait belt Activity Tolerance: Patient limited by fatigue Patient left: in chair;with call bell/phone within reach;with chair alarm set   PT Visit Diagnosis: Difficulty in walking, not elsewhere classified (R26.2);Pain Pain - Right/Left: Right Pain - part of body: Knee     Time: 9935-7017 PT Time Calculation (min) (ACUTE ONLY): 32 min  Charges:  $Gait Training: 8-22 mins $Therapeutic Exercise: 8-22 mins                        Doreatha Massed, PT Acute Rehabilitation  Office: [phone] Pager: [phone]

## 2021-06-08 NOTE — Progress Notes (Signed)
Subjective: 3 Days Post-Op Procedure(s) (LRB): COMPUTER ASSISTED TOTAL KNEE ARTHROPLASTY (Right) Patient seen in rounds for Dr. Lyla Glassing Patient reports pain as mild.   Patient did get up with PT yesterday but reported some Nausea with PT Overall feeling better this am, was sitting in the chair and eating breakfast  Objective: Vital signs in last 24 hours: Temp:  [98.3 F (36.8 C)-98.9 F (37.2 C)] 98.4 F (36.9 C) (02/26 0544) Pulse Rate:  [74-93] 85 (02/26 0839) Resp:  [16-18] 18 (02/26 0544) BP: (135-155)/(78-94) 153/78 (02/26 0839) SpO2:  [81 %-94 %] 87 % (02/26 0839) Weight:  [68.5 kg] 68.5 kg (02/25 1500)  Intake/Output from previous day: 02/25 0701 - 02/26 0700 In: 1020 [P.O.:1020] Out: -  Intake/Output this shift: No intake/output data recorded.  Recent Labs    06/06/21 0323 06/07/21 0314 06/08/21 0343  HGB 9.5* 8.7* 8.9*   Recent Labs    06/07/21 0314 06/08/21 0343  WBC 14.3* 12.4*  RBC 2.75* 2.78*  HCT 26.3* 26.5*  PLT 136* 141*   Recent Labs    06/06/21 0323  NA 137  K 4.0  CL 108  CO2 23  BUN 24*  CREATININE 0.69  GLUCOSE 156*  CALCIUM 8.6*   No results for input(s): LABPT, INR in the last 72 hours.  Neurologically intact Neurovascular intact Sensation intact distally Intact pulses distally Dorsiflexion/Plantar flexion intact Incision: dressing C/D/I Compartment soft   Assessment/Plan: 3 Days Post-Op Procedure(s) (LRB): COMPUTER ASSISTED TOTAL KNEE ARTHROPLASTY (Right) Advance diet Up with therapy once cleared by PT patient is okay to be d/c home She will be doing OP PT at our office DVT ppx: ASA    Derrick Ravel [phone] 06/08/2021, 8:43 AM

## 2021-06-09 MED ORDER — DILTIAZEM HCL ER COATED BEADS 180 MG PO CP24
180.0000 mg | ORAL_CAPSULE | ORAL | Status: AC
  Administered 2021-06-09: 180 mg via ORAL
  Filled 2021-06-09: qty 1

## 2021-06-09 MED ORDER — DILTIAZEM HCL 25 MG/5ML IV SOLN
10.0000 mg | Freq: Once | INTRAVENOUS | Status: AC
  Administered 2021-06-09: 10 mg via INTRAVENOUS
  Filled 2021-06-09: qty 5

## 2021-06-09 MED ORDER — AMIODARONE HCL 200 MG PO TABS
400.0000 mg | ORAL_TABLET | Freq: Two times a day (BID) | ORAL | Status: DC
  Administered 2021-06-09: 400 mg via ORAL
  Filled 2021-06-09: qty 2

## 2021-06-09 NOTE — Progress Notes (Signed)
° ° °  Subjective:  Patient reports pain as mild to moderate.  Denies N/VCP/SOB.   Objective:   VITALS:   Vitals:   06/09/21 0846 06/09/21 1130 06/09/21 1229 06/09/21 1231  BP: (!) 143/93 (!) 134/96 (!) 126/93   Pulse: 100  (!) 149 (!) 153  Resp: 17  15   Temp: 98.8 F (37.1 C)  98.6 F (37 C)   TempSrc: Oral  Oral   SpO2: 95%  93%   Weight:      Height:        NAD ABD soft Sensation intact distally Intact pulses distally Dorsiflexion/Plantar flexion intact Incision: dressing C/D/I Compartment soft   Lab Results  Component Value Date   WBC 12.4 (H) 06/08/2021   HGB 8.9 (L) 06/08/2021   HCT 26.5 (L) 06/08/2021   MCV 95.3 06/08/2021   PLT 141 (L) 06/08/2021   BMET    Component Value Date/Time   NA 137 06/06/2021 0323   K 4.0 06/06/2021 0323   CL 108 06/06/2021 0323   CO2 23 06/06/2021 0323   GLUCOSE 156 (H) 06/06/2021 0323   BUN 24 (H) 06/06/2021 0323   CREATININE 0.69 06/06/2021 0323   CALCIUM 8.6 (L) 06/06/2021 0323   GFRNONAA >60 06/06/2021 0323     Assessment/Plan: 4 Days Post-Op   Principal Problem:   Degenerative arthritis of right knee Active Problems:   Osteoarthritis of right knee   WBAT RLE with walker DVT ppx: Aspirin, SCDs, TEDS PO pain control PT/OT N/V: resolved since stopping narcotics, cont tramadol PRN Tachycardia: Dr. Einar Gip has ordered diltiazen gtt and xfer orders to higher acuity, appreciate assistance Dispo: D/C home with OPPT when ok with cardiology   Hilton Cork [place] 06/09/2021, 1:14 PM   Rod Can, MD [phone] [facility] now [facility]   [place] [address], [address], [place], [place] [address] Phone: [phone] [email or web address]   [employer]

## 2021-06-09 NOTE — Consult Note (Signed)
CARDIOLOGY CONSULT NOTE  [name] MRN: [id number] DOB/AGE: [birth year] 81 y.o.  Admit date: 06/05/2021 Referring Physician  Rod Can, MD Primary Physician:  Deland Pretty, MD Reason for Consultation  A. Fib  [name], female    DOB: [birth year], 81 y.o.   MRN: [id number]  No chief complaint on file.  HPI:    Cynthia [name]  is a 81 y.o. Caucasian female with hypertension, hyperlipidemia, frequent UTI leading to severe orthostatic hypotension,  paroxysmal atrial fibrillation, atrial tachycardia, obstructive sleep apnea on CPAP.  She also has loop recorder implantation for evaluation of syncope and sinus node dysfunction.  Admitted on 06/05/2021 for right total knee arthroplasty.  Yesterday patient went into atrial fibrillation with rapid ventricular response.  Due to persistent atrial fibrillation with RVR, I was consulted for management.  This morning after reviewing her chart, I had started her on amiodarone due to persistent atrial fibrillation with RVR and also gave her 1 dose of IV diltiazem.  This afternoon patient converted to sinus rhythm and has received only 1 dose of amiodarone.  Remains asymptomatic, except for arthritis pain.  Past Medical History:  Diagnosis Date   Acute GI bleeding 10/27/2017   Arthritis    "joints" (11/10/2017)   Dizziness    Dysrhythmia    afib   Encounter for loop recorder check 06/09/2020   GERD (gastroesophageal reflux disease)    GI bleed 11/06/2017   Hearing loss    History of blood transfusion 10/2017   Hyperlipidemia    Hypertension    Hyperthyroidism    Loop recorder Biotronik Loop Recorder 05/23/2020 05/23/2020   Melanoma ([place])    "cut off my back"   Memory loss    Pneumonia    Sleep apnea    cpap   Syncope and collapse    Past Surgical History:  Procedure Laterality Date   ABDOMINAL HYSTERECTOMY     "partial"   APPENDECTOMY     BIOPSY  11/17/2017   Procedure: BIOPSY;  Surgeon: Clarene Essex,  MD;  Location: [facility];  Service: Endoscopy;;   COLONOSCOPY WITH PROPOFOL N/A 11/17/2017   Procedure: COLONOSCOPY WITH PROPOFOL;  Surgeon: Clarene Essex, MD;  Location: [facility];  Service: Endoscopy;  Laterality: N/A;   CRANIECTOMY FOR EXCISION OF ACOUSTIC NEUROMA     ESOPHAGOGASTRODUODENOSCOPY (EGD) WITH PROPOFOL N/A 10/28/2017   Procedure: ESOPHAGOGASTRODUODENOSCOPY (EGD) WITH PROPOFOL;  Surgeon: Clarene Essex, MD;  Location: [facility];  Service: Endoscopy;  Laterality: N/A;   FOOT SURGERY Bilateral 04/18/2020   KNEE ARTHROPLASTY Right 06/05/2021   Procedure: COMPUTER ASSISTED TOTAL KNEE ARTHROPLASTY;  Surgeon: Rod Can, MD;  Location: [facility];  Service: Orthopedics;  Laterality: Right;   KNEE ARTHROSCOPY Left    LEFT HEART CATH AND CORONARY ANGIOGRAPHY N/A 11/24/2016   Procedure: LEFT HEART CATH AND CORONARY ANGIOGRAPHY;  Surgeon: Jettie Booze, MD;  Location: [facility];  Service: Cardiovascular;  Laterality: N/A;   MELANOMA EXCISION     "off my back"   NASAL SINUS SURGERY     TONSILLECTOMY     Social History   Tobacco Use   Smoking status: Former    Years: 2.00    Types: Cigarettes    Quit date: 1980    Years since quitting: 43.1   Smokeless tobacco: Never   Tobacco comments:    11/10/2017 "only smoked when I was on my period"  Substance Use Topics   Alcohol use: Yes    Comment:  wine on occas    Family History  Problem Relation Age of Onset   Congestive Heart Failure Mother    Heart disease Father        History of heart attacks at a later age.     Heart disease Brother    Hearing loss Brother     Marital Status: Married  ROS  Review of Systems  Cardiovascular:  Negative for chest pain, dyspnea on exertion and leg swelling.  Objective   Vitals with BMI 06/09/2021 06/09/2021 06/09/2021  Height - - -  Weight - - -  BMI - - -  Systolic - 865 784  Diastolic - 93 96  Pulse 696 149 -    Blood pressure (!) 126/93, pulse (!) 153, temperature 98.6  F (37 C), temperature source Oral, resp. rate 15, height 5\' 4"  (1.626 m), weight 68.5 kg, SpO2 93 %.    Orthostatic VS for the past 72 hrs (Last 3 readings):  BP Location  06/09/21 1229 Left Arm  06/09/21 1130 Left Arm  06/09/21 0846 Left Arm    Physical Exam Neck:     Vascular: No JVD.  Cardiovascular:     Rate and Rhythm: Normal rate and regular rhythm.     Pulses: Intact distal pulses.     Heart sounds: Normal heart sounds. No murmur heard.   No gallop.  Pulmonary:     Effort: Pulmonary effort is normal.     Breath sounds: Normal breath sounds.  Abdominal:     General: Bowel sounds are normal.     Palpations: Abdomen is soft.  Musculoskeletal:     Right lower leg: No edema.     Left lower leg: No edema.   Laboratory examination:   Recent Labs    12/25/20 2112 06/02/21 0950 06/06/21 0323  NA 135 134* 137  K 3.4* 3.8 4.0  CL 99 100 108  CO2 25 28 23   GLUCOSE 115* 90 156*  BUN 31* 25* 24*  CREATININE 0.91 0.75 0.69  CALCIUM 9.1 9.1 8.6*  GFRNONAA >60 >60 >60   estimated creatinine clearance is 54.2 mL/min (by C-G formula based on SCr of 0.69 mg/dL).  CMP Latest Ref Rng & Units 06/06/2021 06/02/2021 12/25/2020  Glucose 70 - 99 mg/dL 156(H) 90 115(H)  BUN 8 - 23 mg/dL 24(H) 25(H) 31(H)  Creatinine 0.44 - 1.00 mg/dL 0.69 0.75 0.91  Sodium 135 - 145 mmol/L 137 134(L) 135  Potassium 3.5 - 5.1 mmol/L 4.0 3.8 3.4(L)  Chloride 98 - 111 mmol/L 108 100 99  CO2 22 - 32 mmol/L 23 28 25   Calcium 8.9 - 10.3 mg/dL 8.6(L) 9.1 9.1  Total Protein 6.5 - 8.1 g/dL - - -  Total Bilirubin 0.3 - 1.2 mg/dL - - -  Alkaline Phos 38 - 126 U/L - - -  AST 15 - 41 U/L - - -  ALT 0 - 44 U/L - - -   CBC Latest Ref Rng & Units 06/08/2021 06/07/2021 06/06/2021  WBC 4.0 - 10.5 K/uL 12.4(H) 14.3(H) 15.6(H)  Hemoglobin 12.0 - 15.0 g/dL 8.9(L) 8.7(L) 9.5(L)  Hematocrit 36.0 - 46.0 % 26.5(L) 26.3(L) 28.5(L)  Platelets 150 - 400 K/uL 141(L) 136(L) 145(L)   Lipid Panel No results for input(s):  CHOL, TRIG, LDLCALC, VLDL, HDL, CHOLHDL, LDLDIRECT in the last 8760 hours.  HEMOGLOBIN A1C Lab Results  Component Value Date   HGBA1C 5.6 11/24/2016   MPG 114 11/24/2016   TSH No results for input(s): TSH in the  last 8760 hours. BNP (last 3 results) No results for input(s): BNP in the last 8760 hours.  Cardiac Panel (last 3 results) No results for input(s): CKTOTAL, CKMB, TROPONINIHS, RELINDX in the last 72 hours.   Medications and allergies   Allergies  Allergen Reactions   Flecainide Other (See Comments)    Severe dizziness   Avelox [Moxifloxacin Hcl In Nacl] Other (See Comments)    Unknown reaction   Ciprofloxacin     Other reaction(s): multiple medication interactions   Other Other (See Comments)    Avelox+cymbalta Together Other reaction(s): flu like symptoms   Oxycodone Other (See Comments)    uphoria   Trimethoprim Other (See Comments)    Other reaction(s): hives   Alendronate Sodium Nausea Only     Current Meds  Medication Sig   acetaminophen (TYLENOL) 500 MG tablet Take 1,000 mg by mouth at bedtime.   Alpha-Lipoic Acid (LIPOIC ACID PO) Take 600 mg by mouth daily.   apixaban (ELIQUIS) 5 MG TABS tablet Take 1 tablet (5 mg total) by mouth 2 (two) times daily. With breakfast and dinner   atorvastatin (LIPITOR) 10 MG tablet TAKE 1 TABLET BY MOUTH EVERY DAY   AZO-CRANBERRY PO Take 1 tablet by mouth daily.   Calcium Carbonate-Vitamin D3 600-400 MG-UNIT TABS Take 1 tablet by mouth daily.   cycloSPORINE (RESTASIS) 0.05 % ophthalmic emulsion Place 1 drop into both eyes daily.   diltiazem (CARDIZEM CD) 120 MG 24 hr capsule TAKE 1 CAPSULE BY MOUTH EVERY DAY   fluticasone (FLONASE) 50 MCG/ACT nasal spray Place 1 spray into the nose at bedtime.   hydrochlorothiazide (HYDRODIURIL) 12.5 MG tablet Take 1 tablet (12.5 mg total) by mouth in the morning.   losartan (COZAAR) 100 MG tablet Take 100 mg by mouth every evening.   meclizine (ANTIVERT) 25 MG tablet Take 25 mg by mouth  daily as needed for dizziness.   Melatonin 5 MG CAPS Take 5 mg by mouth at bedtime.    metoprolol succinate (TOPROL-XL) 25 MG 24 hr tablet TAKE 1 TABLET BY MOUTH DAILY WITH OR IMMEDIATELY FOLLOWING A MEAL   Multiple Vitamin (MULTIVITAMIN WITH MINERALS) TABS tablet Take 1-2 tablets by mouth daily. flintstone vitamins Chewable with Iron   Multiple Vitamins-Minerals (MULTIVITAMIN WITH MINERALS) tablet Take 1 tablet by mouth daily. Centrum   Omega-3 1000 MG CAPS Take 2,000 mg by mouth daily.    pantoprazole (PROTONIX) 40 MG tablet Take 40 mg by mouth daily before breakfast.    sertraline (ZOLOFT) 25 MG tablet Take 25 mg by mouth daily.   thyroid (ARMOUR) 60 MG tablet Take 60 mg by mouth daily before breakfast.   traMADol (ULTRAM) 50 MG tablet Take 50 mg by mouth at bedtime.   vitamin C (ASCORBIC ACID) 500 MG tablet Take 500 mg by mouth daily.     Scheduled Meds:  apixaban  2.5 mg Oral Q12H   vitamin C  500 mg Oral Daily   atorvastatin  10 mg Oral Daily   calcium-vitamin D  1 tablet Oral Q breakfast   cycloSPORINE  1 drop Both Eyes QHS   docusate sodium  100 mg Oral BID   fluticasone  1 spray Each Nare QHS   melatonin  5 mg Oral QHS   metoprolol succinate  25 mg Oral BID   multivitamin with minerals  1 tablet Oral Daily   omega-3 acid ethyl esters  2 g Oral Daily   pantoprazole  40 mg Oral QAC breakfast   senna  1 tablet Oral BID   sertraline  25 mg Oral Daily   thyroid  60 mg Oral QAC breakfast   Continuous Infusions:  sodium chloride Stopped (06/06/21 0950)   methocarbamol (ROBAXIN) IV     PRN Meds:.acetaminophen, alum & mag hydroxide-simeth, diphenhydrAMINE, isopropyl alcohol, lip balm, meclizine, menthol-cetylpyridinium **OR** phenol, methocarbamol **OR** methocarbamol (ROBAXIN) IV, metoCLOPramide **OR** metoCLOPramide (REGLAN) injection, morphine injection, nitroGLYCERIN, ondansetron **OR** ondansetron (ZOFRAN) IV, polyethylene glycol, promethazine **OR** prochlorperazine **OR**  promethazine, traMADol   I/O last 3 completed shifts: In: 1120 [P.O.:1120] Out: -  Total I/O In: 480 [P.O.:480] Out: -     Radiology:   NA  Cardiac Studies:   Coronary angiogram 11/24/2016:  Normal coronaries; tortuous LAD  Echocardiogram 12/19/2020: Normal LV systolic function with visual EF 60-65%. Left ventricle cavity is normal in size. Mild left ventricular hypertrophy. Normal global wall motion. Normal diastolic filling pattern, normal LAP.  Mild (Grade I) mitral regurgitation. Mild calcification of the mitral valve annulus. Mild tricuspid regurgitation. No evidence of pulmonary hypertension. Compared to study 11/09/2017 no significant change.   Loop Recorder Biotronik Biomonitor III 05/23/2020    Remote loop recorder transmission 05/14/2021: Predominant rhythm is normal sinus rhythm.  AT/AF burden 0%.  No symptoms reported.  EKG:  EKG 06/08/2021: Atrial flutter, atypical with 2: 1 conduction, normal axis, nonspecific T abnormality.  Compared to 02/05/2021, atypical atrial flutter is new.  Assessment   1.  Paroxysmal atrial fibrillation and atypical atrial flutter with 2: 1 conduction 2.  Orthostatic hypotension related to history of recurrent UTI presently asymptomatic 3.  Primary hypertension  CHA2DS2-VASc Score is 4.  Yearly risk of stroke: 4.8% (F, A, HTN).  Score of 1=0.6; 2=2.2; 3=3.2; 4=4.8; 5=7.2; 6=9.8; 7=>9.8) -(CHF; HTN; vasc disease DM,  Female = 1; Age <65 =0; 65-74 = 1,  >75 =2; stroke/embolism= 2).    Recommendations:   Patient has converted back to sinus rhythm.  In the recent past she has not had any atrial fibrillation or atrial tachycardia or atrial flutter by remote loop recorder transmission, patient only received 1 dose of amiodarone and 1 dose of IV diltiazem with converted to sinus rhythm.  Hence I do not think she needs long-term amiodarone, I have discontinued this.  She can be discharged home on home medications, check orthostatics today, blood  pressure is well controlled.  She is tolerating anticoagulation without bleeding diathesis.  We will follow her up in the outpatient basis.   Adrian Prows, MD, [facility] 06/09/2021, 12:44 PM Office: [phone]

## 2021-06-09 NOTE — Progress Notes (Signed)
PT Cancellation Note  Patient Details Name: Cynthia [name] MRN: [id number] DOB: [birth year]   Cancelled Treatment:    Reason Eval/Treat Not Completed: Medical issues which prohibited therapy. Will hold afternoon PT session. Pt transferred to telemetry for elevated HR. Will resume PT tomorrow if appropriate. Thanks.    [facility]  Office: [phone] Pager: [phone]

## 2021-06-09 NOTE — Progress Notes (Signed)
Physical Therapy Treatment Patient Details Name: Cynthia [name] MRN: [id number] DOB: Aug 30, 1940 Today's Date: 06/09/2021   History of Present Illness 81 y.o. female admitted 06/05/21 for R TKA. PMH includes: hypotension, normal pressure hydocephalus, polyneuropathy, limb movement d/o, acoustic neuroma excision.    PT Comments    Bed level exercises only this session. HR fluctuating between 120s-140s while lying in bed. Pt continues to have some confusion as well. Will attempt ambulation later today if HR okay   Recommendations for follow up therapy are one component of a multi-disciplinary discharge planning process, led by the attending physician.  Recommendations may be updated based on patient status, additional functional criteria and insurance authorization.  Follow Up Recommendations  Follow physician's recommendations for discharge plan and follow up therapies (may need to consider HHPT. Husband is also requesting in home assistance)     Assistance Recommended at Discharge Frequent or constant Supervision/Assistance  Patient can return home with the following A little help with walking and/or transfers;A little help with bathing/dressing/bathroom;Assist for transportation;Assistance with cooking/housework;Help with stairs or ramp for entrance   Equipment Recommendations  None recommended by PT    Recommendations for Other Services       Precautions / Restrictions Precautions Precautions: Fall;Knee Precaution Comments: instructed pt in no pillow under knee and to try to keep knee straight when at rest (in bed, in recliner) Restrictions Weight Bearing Restrictions: No RLE Weight Bearing: Weight bearing as tolerated     Mobility  Bed Mobility               General bed mobility comments: Deferred-HR fluctuating between 120s-140s, O2 90-91% on O2  at rest. Bed level exercises only this session.    Transfers                        Ambulation/Gait                    Stairs             Wheelchair Mobility    Modified Rankin (Stroke Patients Only)       Balance Overall balance assessment: Needs assistance         Standing balance support: Bilateral upper extremity supported, Reliant on assistive device for balance, During functional activity Standing balance-Leahy Scale: Poor                              Cognition Arousal/Alertness: Awake/alert Behavior During Therapy: WFL for tasks assessed/performed Overall Cognitive Status: No family/caregiver present to determine baseline cognitive functioning Area of Impairment: Problem solving, Safety/judgement, Memory                     Memory: Decreased short-term memory   Safety/Judgement: Decreased awareness of safety   Problem Solving: Requires verbal cues, Slow processing General Comments: Repeated cueing required.        Exercises Total Joint Exercises Ankle Circles/Pumps: AROM, Both, 10 reps Quad Sets: AAROM, Right, 10 reps, AROM Short Arc Quad: AAROM, AROM, Right, 10 reps Straight Leg Raises: AROM, AAROM, Right, 10 reps Goniometric ROM: ~15-70 degrees    General Comments        Pertinent Vitals/Pain Pain Assessment Pain Assessment: Faces Faces Pain Scale: Hurts little more Pain Location: R knee Pain Descriptors / Indicators: Discomfort, Sore Pain Intervention(s): Limited activity within patient's tolerance, Monitored during session, Ice applied, Repositioned    Home Living  Prior Function            PT Goals (current goals can now be found in the care plan section) Progress towards PT goals: Not progressing toward goals - comment (limited progression (HR and cognition issues))    Frequency    7X/week      PT Plan Discharge plan needs to be updated;Current plan remains appropriate    Co-evaluation              AM-PAC PT "6 Clicks" Mobility   Outcome Measure  Help needed  turning from your back to your side while in a flat bed without using bedrails?: A Little Help needed moving from lying on your back to sitting on the side of a flat bed without using bedrails?: A Little Help needed moving to and from a bed to a chair (including a wheelchair)?: A Little Help needed standing up from a chair using your arms (e.g., wheelchair or bedside chair)?: A Little Help needed to walk in hospital room?: A Lot Help needed climbing 3-5 steps with a railing? : A Lot 6 Click Score: 16    End of Session Equipment Utilized During Treatment: Gait belt Activity Tolerance:  (session limited by HR this am) Patient left: in bed;with call bell/phone within reach;with family/visitor present;with bed alarm set   PT Visit Diagnosis: Difficulty in walking, not elsewhere classified (R26.2);Pain Pain - Right/Left: Right Pain - part of body: Knee     Time: 1022-1038 PT Time Calculation (min) (ACUTE ONLY): 16 min  Charges:  $Therapeutic Exercise: 8-22 mins                         Doreatha Massed, PT Acute Rehabilitation  Office: [phone] Pager: [phone]

## 2021-06-09 NOTE — Plan of Care (Signed)
°  Problem: Activity: Goal: Risk for activity intolerance will decrease Outcome: Progressing   Problem: Pain Managment: Goal: General experience of comfort will improve Outcome: Progressing   Problem: Education: Goal: Knowledge of the prescribed therapeutic regimen will improve Outcome: Progressing

## 2021-06-09 NOTE — Progress Notes (Addendum)
Pt resting in bed comfortably, denies any pain 0/10. Pt also denies chest pain, no palpitations, no SOB. Dr. Einar Gip notified. Dr. Lyla Glassing called and updated.  1345 Pt transferred to 1442 with belongings. Pt alert and oriented, not in acute distress. Pt's son at bedside.   06/09/21 1229  Vitals  Temp 98.6 F (37 C)  Temp Source Oral  BP (!) 126/93  MAP (mmHg) 103  BP Location Left Arm  BP Method Automatic  Patient Position (if appropriate) Lying  Pulse Rate (!) 149  Pulse Rate Source Dinamap  Resp 15  Level of Consciousness  Level of Consciousness Alert  MEWS COLOR  MEWS Score Color Yellow  Oxygen Therapy  SpO2 93 %  O2 Device Nasal Cannula  O2 Flow Rate (L/min) 3 L/min  Patient Activity (if Appropriate) In bed  Pulse Oximetry Type Continuous  Pain Assessment  Pain Scale 0-10  Pain Score 0  MEWS Score  MEWS Temp 0  MEWS Systolic 0  MEWS Pulse 3  MEWS RR 0  MEWS LOC 0  MEWS Score 3

## 2021-06-10 MED ORDER — DILTIAZEM HCL ER COATED BEADS 120 MG PO CP24
120.0000 mg | ORAL_CAPSULE | Freq: Every day | ORAL | Status: DC
  Administered 2021-06-10: 120 mg via ORAL
  Filled 2021-06-10: qty 1

## 2021-06-10 MED ORDER — METOPROLOL SUCCINATE ER 50 MG PO TB24: 50.0000 mg | ORAL_TABLET | Freq: Every day | ORAL | 1 refills | Status: DC

## 2021-06-10 NOTE — TOC Progression Note (Addendum)
Transition of Care [facility]) - Progression Note    Patient Details  Name: Jacquelynne Guedes MRN: [id number] Date of Birth: [birth year]  Transition of Care [facility]) CM/SW Contact  Purcell Mouton, RN Phone Number: 06/10/2021, 12:29 PM  Clinical Narrative:    Spoke with pt's daughter Malachy Mood who was not happy concerning pt going to OP PT. Explained that MD would like for pt to come into the office for OP PT which pt would do better and heal. Appointment March 1 at 64 AM. Daughter will have father, pt's husband to call MD's office.  CM signing out.      Barriers to Discharge: No Barriers Identified  Expected Discharge Plan and Services           Expected Discharge Date: 06/10/21               DME Arranged: N/A DME Agency: NA                   Social Determinants of Health (SDOH) Interventions    Readmission Risk Interventions No flowsheet data found.

## 2021-06-10 NOTE — Care Management Important Message (Signed)
Important Message  Patient Details IM Letter placed in Patients room. Name: Cynthia [name] MRN: [id number] Date of Birth: 05/10/1940   Medicare Important Message Given:  Yes     Kerin Salen 06/10/2021, 10:40 AM

## 2021-06-10 NOTE — TOC Progression Note (Signed)
Transition of Care [facility]) - Progression Note    Patient Details  Name: Cynthia [name] MRN: [id number] Date of Birth: 08-13-1940  [facility]) CM/SW Contact  Purcell Mouton, RN Phone Number: 06/10/2021, 12:16 PM  Clinical Narrative:     Spoke with pt's husband who asked for HHPT and [place] for his wife. Will call Ortho's CM to inform her. Pt is a Bundle.     Barriers to Discharge: No Barriers Identified  Expected Discharge Plan and Services           Expected Discharge Date: 06/10/21               DME Arranged: N/A DME Agency: NA                   Social Determinants of Health (SDOH) Interventions    Readmission Risk Interventions No flowsheet data found.

## 2021-06-10 NOTE — Discharge Summary (Signed)
Physician Discharge Summary  [name] MRN: [id number] DOB/AGE: 79/29/1942 79 y.o.  Admit date: 06/05/2021 Discharge date: 06/10/2021  Admission Diagnoses:  Degenerative arthritis of right knee  Discharge Diagnoses:  Principal Problem:   Degenerative arthritis of right knee Active Problems:   Osteoarthritis of right knee   Past Medical History:  Diagnosis Date   Acute GI bleeding 10/27/2017   Arthritis    "joints" (11/10/2017)   Dizziness    Dysrhythmia    afib   Encounter for loop recorder check 06/09/2020   GERD (gastroesophageal reflux disease)    GI bleed 11/06/2017   Hearing loss    History of blood transfusion 10/2017   Hyperlipidemia    Hypertension    Hyperthyroidism    Loop recorder Biotronik Loop Recorder 05/23/2020 05/23/2020   Melanoma ([place])    "cut off my back"   Memory loss    Pneumonia    Sleep apnea    cpap   Syncope and collapse     Surgeries: Procedure(s): COMPUTER ASSISTED TOTAL KNEE ARTHROPLASTY on 06/05/2021   Consultants (if any): Treatment Team:  Adrian Prows, MD  Discharged Condition: Improved  Hospital Course: Cynthia [name] is an 81 y.o. female who was admitted 06/05/2021 with a diagnosis of Degenerative arthritis of right knee and went to the operating room on 06/05/2021 and underwent the above named procedures.    She was given perioperative antibiotics:  Anti-infectives (From admission, onward)    Start     Dose/Rate Route Frequency Ordered Stop   06/05/21 1400  ceFAZolin (ANCEF) IVPB 2g/100 mL premix        2 g 200 mL/hr over 30 Minutes Intravenous Every 6 hours 06/05/21 1241 06/05/21 1953   06/05/21 0600  ceFAZolin (ANCEF) IVPB 2g/100 mL premix        2 g 200 mL/hr over 30 Minutes Intravenous On call to O.R. 06/05/21 3903 06/05/21 0739       She was given sequential compression devices, early ambulation, and apixaban for DVT prophylaxis.  On POD#1, she had nausea and vomiting. Narcotics were held, and the  symptoms resolved over the next couple of days. Pain was well controlled on Ultram.  On POD#4, she developed asymptomatic atrial fibrillation with RVR. Dr. Einar Gip evaluated the patient, and she converted back to NSR after one dose of amiodarone.  She benefited maximally from the hospital stay and there were no complications.    Recent vital signs:  Vitals:   06/09/21 2054 06/10/21 0627  BP: 130/64 (!) 148/82  Pulse: 80 82  Resp: 18 20  Temp: 99.4 F (37.4 C) 98.6 F (37 C)  SpO2: 94% 91%    Recent laboratory studies:  Lab Results  Component Value Date   HGB 8.9 (L) 06/08/2021   HGB 8.7 (L) 06/07/2021   HGB 9.5 (L) 06/06/2021   Lab Results  Component Value Date   WBC 12.4 (H) 06/08/2021   PLT 141 (L) 06/08/2021   Lab Results  Component Value Date   INR 1.0 03/20/2020   Lab Results  Component Value Date   NA 137 06/06/2021   K 4.0 06/06/2021   CL 108 06/06/2021   CO2 23 06/06/2021   BUN 24 (H) 06/06/2021   CREATININE 0.69 06/06/2021   GLUCOSE 156 (H) 06/06/2021     Allergies as of 06/10/2021       Reactions   Flecainide Other (See Comments)   Severe dizziness   Avelox [moxifloxacin Hcl In Nacl] Other (See  Comments)   Unknown reaction   Ciprofloxacin    Other reaction(s): multiple medication interactions   Other Other (See Comments)   Avelox+cymbalta Together Other reaction(s): flu like symptoms   Oxycodone Other (See Comments)   uphoria   Trimethoprim Other (See Comments)   Other reaction(s): hives   Alendronate Sodium Nausea Only        Medication List     TAKE these medications    acetaminophen 500 MG tablet Commonly known as: TYLENOL Take 1,000 mg by mouth at bedtime.   apixaban 5 MG Tabs tablet Commonly known as: Eliquis Take 1 tablet (5 mg total) by mouth 2 (two) times daily. With breakfast and dinner   atorvastatin 10 MG tablet Commonly known as: LIPITOR TAKE 1 TABLET BY MOUTH EVERY DAY   AZO-CRANBERRY PO Take 1 tablet by mouth  daily.   Calcium Carbonate-Vitamin D3 600-400 MG-UNIT Tabs Take 1 tablet by mouth daily.   cephALEXin 500 MG capsule Commonly known as: KEFLEX Take 500 mg by mouth daily.   diltiazem 120 MG 24 hr capsule Commonly known as: CARDIZEM CD TAKE 1 CAPSULE BY MOUTH EVERY DAY   fluticasone 50 MCG/ACT nasal spray Commonly known as: FLONASE Place 1 spray into the nose at bedtime.   hydrochlorothiazide 12.5 MG tablet Commonly known as: HYDRODIURIL Take 1 tablet (12.5 mg total) by mouth in the morning.   LIPOIC ACID PO Take 600 mg by mouth daily.   losartan 100 MG tablet Commonly known as: COZAAR Take 100 mg by mouth every evening.   meclizine 25 MG tablet Commonly known as: ANTIVERT Take 25 mg by mouth daily as needed for dizziness.   Melatonin 5 MG Caps Take 5 mg by mouth at bedtime.   methocarbamol 500 MG tablet Commonly known as: ROBAXIN Take 1 tablet (500 mg total) by mouth every 6 (six) hours as needed for muscle spasms.   metoCLOPramide 5 MG tablet Commonly known as: REGLAN Take 1 tablet (5 mg total) by mouth every 8 (eight) hours as needed for nausea (if ondansetron (ZOFRAN) ineffective.).   metoprolol succinate 50 MG 24 hr tablet Commonly known as: TOPROL-XL Take 1 tablet (50 mg total) by mouth daily. What changed:  medication strength See the new instructions.   multivitamin with minerals tablet Take 1 tablet by mouth daily. Centrum   multivitamin with minerals Tabs tablet Take 1-2 tablets by mouth daily. flintstone vitamins Chewable with Iron   nitroGLYCERIN 0.4 MG/SPRAY spray Commonly known as: NITROLINGUAL Place 1 spray under the tongue every 5 (five) minutes x 3 doses as needed for chest pain.   Omega-3 1000 MG Caps Take 2,000 mg by mouth daily.   pantoprazole 40 MG tablet Commonly known as: PROTONIX Take 40 mg by mouth daily before breakfast.   Restasis 0.05 % ophthalmic emulsion Generic drug: cycloSPORINE Place 1 drop into both eyes daily.    sertraline 25 MG tablet Commonly known as: ZOLOFT Take 25 mg by mouth daily.   thyroid 60 MG tablet Commonly known as: ARMOUR Take 60 mg by mouth daily before breakfast.   traMADol 50 MG tablet Commonly known as: ULTRAM Take 50 mg by mouth at bedtime. What changed: Another medication with the same name was added. Make sure you understand how and when to take each.   traMADol 50 MG tablet Commonly known as: ULTRAM Take 1 tablet (50 mg total) by mouth every 6 (six) hours as needed for severe pain. What changed: You were already taking a medication with the  same name, and this prescription was added. Make sure you understand how and when to take each.   vitamin C 500 MG tablet Commonly known as: ASCORBIC ACID Take 500 mg by mouth daily.          WEIGHT BEARING   Weight bearing as tolerated with assist device (walker, cane, etc) as directed, use it as long as suggested by your surgeon or therapist, typically at least 4-6 weeks.   EXERCISES  Results after joint replacement surgery are often greatly improved when you follow the exercise, range of motion and muscle strengthening exercises prescribed by your doctor. Safety measures are also important to protect the joint from further injury. Any time any of these exercises cause you to have increased pain or swelling, decrease what you are doing until you are comfortable again and then slowly increase them. If you have problems or questions, call your caregiver or physical therapist for advice.   Rehabilitation is important following a joint replacement. After just a few days of immobilization, the muscles of the leg can become weakened and shrink (atrophy).  These exercises are designed to build up the tone and strength of the thigh and leg muscles and to improve motion. Often times heat used for twenty to thirty minutes before working out will loosen up your tissues and help with improving the range of motion but do not use heat for  the first two weeks following surgery (sometimes heat can increase post-operative swelling).   These exercises can be done on a training (exercise) mat, on the floor, on a table or on a bed. Use whatever works the best and is most comfortable for you.    Use music or television while you are exercising so that the exercises are a pleasant break in your day. This will make your life better with the exercises acting as a break in your routine that you can look forward to.   Perform all exercises about fifteen times, three times per day or as directed.  You should exercise both the operative leg and the other leg as well.  Exercises include:   Quad Sets - Tighten up the muscle on the front of the thigh (Quad) and hold for 5-10 seconds.   Straight Leg Raises - With your knee straight (if you were given a brace, keep it on), lift the leg to 60 degrees, hold for 3 seconds, and slowly lower the leg.  Perform this exercise against resistance later as your leg gets stronger.  Leg Slides: Lying on your back, slowly slide your foot toward your buttocks, bending your knee up off the floor (only go as far as is comfortable). Then slowly slide your foot back down until your leg is flat on the floor again.  Angel Wings: Lying on your back spread your legs to the side as far apart as you can without causing discomfort.  Hamstring Strength:  Lying on your back, push your heel against the floor with your leg straight by tightening up the muscles of your buttocks.  Repeat, but this time bend your knee to a comfortable angle, and push your heel against the floor.  You may put a pillow under the heel to make it more comfortable if necessary.   A rehabilitation program following joint replacement surgery can speed recovery and prevent re-injury in the future due to weakened muscles. Contact your doctor or a physical therapist for more information on knee rehabilitation.    CONSTIPATION  Constipation is defined medically  as fewer than three stools per week and severe constipation as less than one stool per week.  Even if you have a regular bowel pattern at home, your normal regimen is likely to be disrupted due to multiple reasons following surgery.  Combination of anesthesia, postoperative narcotics, change in appetite and fluid intake all can affect your bowels.   YOU MUST use at least one of the following options; they are listed in order of increasing strength to get the job done.  They are all available over the counter, and you may need to use some, POSSIBLY even all of these options:    Drink plenty of fluids (prune juice may be helpful) and high fiber foods Colace 100 mg by mouth twice a day  Senokot for constipation as directed and as needed Dulcolax (bisacodyl), take with full glass of water  Miralax (polyethylene glycol) once or twice a day as needed.  If you have tried all these things and are unable to have a bowel movement in the first 3-4 days after surgery call either your surgeon or your primary doctor.    If you experience loose stools or diarrhea, hold the medications until you stool forms back up.  If your symptoms do not get better within 1 week or if they get worse, check with your doctor.  If you experience "the worst abdominal pain ever" or develop nausea or vomiting, please contact the office immediately for further recommendations for treatment.   ITCHING:  If you experience itching with your medications, try taking only a single pain pill, or even half a pain pill at a time.  You can also use Benadryl over the counter for itching or also to help with sleep.   TED HOSE STOCKINGS:  Use stockings on both legs until for at least 2 weeks or as directed by physician office. They may be removed at night for sleeping.  MEDICATIONS:  See your medication summary on the After Visit Summary that nursing will review with you.  You may have some home medications which will be placed on hold until you  complete the course of blood thinner medication.  It is important for you to complete the blood thinner medication as prescribed.  PRECAUTIONS:  If you experience chest pain or shortness of breath - call 911 immediately for transfer to the hospital emergency department.   If you develop a fever greater that 101 F, purulent drainage from wound, increased redness or drainage from wound, foul odor from the wound/dressing, or calf pain - CONTACT YOUR SURGEON.                                                   FOLLOW-UP APPOINTMENTS:  If you do not already have a post-op appointment, please call the office for an appointment to be seen by your surgeon.  Guidelines for how soon to be seen are listed in your After Visit Summary, but are typically between 1-4 weeks after surgery.  OTHER INSTRUCTIONS:   Knee Replacement:  Do not place pillow under knee, focus on keeping the knee straight while resting. CPM instructions: 0-90 degrees, 2 hours in the morning, 2 hours in the afternoon, and 2 hours in the evening. Place foam block, curve side up under heel at all times except when in CPM or when walking.  DO NOT modify, tear,  cut, or change the foam block in any way.   MAKE SURE YOU:  Understand these instructions.  Get help right away if you are not doing well or get worse.    Thank you for letting us be a part of your medical care team.  It is a privilege we respect greatly.  We hope these instructions will help you stay on track for a fast and full recovery!   Diagnostic Studies: DG Knee Right Port  Result Date: 06/05/2021 CLINICAL DATA:  Status post right knee replacement EXAM: PORTABLE RIGHT KNEE - 1-2 VIEW COMPARISON:  Knee MRI 03/22/2021 FINDINGS: There is a 3 component total knee arthroplasty in normal alignment without evidence of loosening or periprosthetic fracture. Expected soft tissue changes including a joint effusion. IMPRESSION: Right knee arthroplasty without evidence of immediate hardware  complication. Electronically Signed   By: Maurine Simmering M.D.   On: 06/05/2021 11:21    Disposition: Discharge disposition: 01-Home or Self Care       Discharge Instructions     Call MD / Call 911   Complete by: As directed    If you experience chest pain or shortness of breath, CALL 911 and be transported to the hospital emergency room.  If you develope a fever above 101 F, pus (white drainage) or increased drainage or redness at the wound, or calf pain, call your surgeon's office.   Call MD / Call 911   Complete by: As directed    If you experience chest pain or shortness of breath, CALL 911 and be transported to the hospital emergency room.  If you develope a fever above 101 F, pus (white drainage) or increased drainage or redness at the wound, or calf pain, call your surgeon's office.   Constipation Prevention   Complete by: As directed    Drink plenty of fluids.  Prune juice may be helpful.  You may use a stool softener, such as Colace (over the counter) 100 mg twice a day.  Use MiraLax (over the counter) for constipation as needed.   Constipation Prevention   Complete by: As directed    Drink plenty of fluids.  Prune juice may be helpful.  You may use a stool softener, such as Colace (over the counter) 100 mg twice a day.  Use MiraLax (over the counter) for constipation as needed.   Diet - low sodium heart healthy   Complete by: As directed    Discharge instructions   Complete by: As directed    Follow up with Dr. Lyla Glassing in the office Keep bandage on until follow up Resume anticoagulation medications   Do not put a pillow under the knee. Place it under the heel.   Complete by: As directed    Driving restrictions   Complete by: As directed    No driving for 6 weeks   Increase activity slowly as tolerated   Complete by: As directed    Increase activity slowly as tolerated   Complete by: As directed    Lifting restrictions   Complete by: As directed    No lifting for 6  weeks   Post-operative opioid taper instructions:   Complete by: As directed    POST-OPERATIVE OPIOID TAPER INSTRUCTIONS: It is important to wean off of your opioid medication as soon as possible. If you do not need pain medication after your surgery it is ok to stop day one. Opioids include: Codeine, Hydrocodone(Norco, Vicodin), Oxycodone(Percocet, oxycontin) and hydromorphone amongst others.  Long term and  even short term use of opiods can cause: Increased pain response Dependence Constipation Depression Respiratory depression And more.  Withdrawal symptoms can include Flu like symptoms Nausea, vomiting And more Techniques to manage these symptoms Hydrate well Eat regular healthy meals Stay active Use relaxation techniques(deep breathing, meditating, yoga) Do Not substitute Alcohol to help with tapering If you have been on opioids for less than two weeks and do not have pain than it is ok to stop all together.  Plan to wean off of opioids This plan should start within one week post op of your joint replacement. Maintain the same interval or time between taking each dose and first decrease the dose.  Cut the total daily intake of opioids by one tablet each day Next start to increase the time between doses. The last dose that should be eliminated is the evening dose.      Post-operative opioid taper instructions:   Complete by: As directed    POST-OPERATIVE OPIOID TAPER INSTRUCTIONS: It is important to wean off of your opioid medication as soon as possible. If you do not need pain medication after your surgery it is ok to stop day one. Opioids include: Codeine, Hydrocodone(Norco, Vicodin), Oxycodone(Percocet, oxycontin) and hydromorphone amongst others.  Long term and even short term use of opiods can cause: Increased pain response Dependence Constipation Depression Respiratory depression And more.  Withdrawal symptoms can include Flu like symptoms Nausea,  vomiting And more Techniques to manage these symptoms Hydrate well Eat regular healthy meals Stay active Use relaxation techniques(deep breathing, meditating, yoga) Do Not substitute Alcohol to help with tapering If you have been on opioids for less than two weeks and do not have pain than it is ok to stop all together.  Plan to wean off of opioids This plan should start within one week post op of your joint replacement. Maintain the same interval or time between taking each dose and first decrease the dose.  Cut the total daily intake of opioids by one tablet each day Next start to increase the time between doses. The last dose that should be eliminated is the evening dose.      TED hose   Complete by: As directed    Use stockings (TED hose) for 2 weeks on both leg(s).  You may remove them at night for sleeping.        Follow-up Information     Rod Can, MD. Go on 06/20/2021.   Specialty: Orthopedic Surgery Why: You are scheduled for first post op appointment on Friday March 10th at 2:45pm. Contact information: [address] [place] [address] [phone]         Rosilyn Mings.. Go on 06/11/2021.   Why: You are scheduled for physical therapy evaluation on Wednesday March 1st at 10:30am. Contact information: [address] [place] [address] [phone]         Alethia Berthold, PA-C Follow up on 06/25/2021.   Specialty: Cardiology Why: 06/25/21 at 11 AM. Please bring all medications Contact information: [place] [address] [phone]                  Signed: Hilton Cork [facility] 06/10/2021, 11:27 AM

## 2021-06-10 NOTE — Progress Notes (Signed)
Physical Therapy Treatment Patient Details Name: Cynthia [name] MRN: [id number] DOB: [birth year] Today's Date: 06/10/2021   History of Present Illness 81 y.o. female admitted 06/05/21 for R TKA. PMH includes: hypotension, normal pressure hydocephalus, polyneuropathy, limb movement d/o, acoustic neuroma excision.    PT Comments    2nd session to practice stair negotiation. Husband present to observe and practice with pt. All education completed.    Recommendations for follow up therapy are one component of a multi-disciplinary discharge planning process, led by the attending physician.  Recommendations may be updated based on patient status, additional functional criteria and insurance authorization.  Follow Up Recommendations  Follow physician's recommendations for discharge plan and follow up therapies (family is requesting [facility])     Assistance Recommended at Discharge Frequent or constant Supervision/Assistance  Patient can return home with the following A little help with walking and/or transfers;A little help with bathing/dressing/bathroom;Assist for transportation;Assistance with cooking/housework;Help with stairs or ramp for entrance   Equipment Recommendations  None recommended by PT    Recommendations for Other Services       Precautions / Restrictions Precautions Precautions: Fall;Knee Precaution Comments: instructed pt in no pillow under knee and to try to keep knee straight when at rest (in bed, in recliner) Restrictions Weight Bearing Restrictions: No RLE Weight Bearing: Weight bearing as tolerated     Mobility  Bed Mobility               General bed mobility comments: oob in recliner    Transfers Overall transfer level: Needs assistance Equipment used: Rolling walker (2 wheels) Transfers: Sit to/from Stand Sit to Stand: Min guard           General transfer comment: Min guard for safety. Cues for safety, hand placement.     Ambulation/Gait Ambulation/Gait assistance: Min guard Gait Distance (Feet): 25 Feet Assistive device: Rolling walker (2 wheels) Gait Pattern/deviations: Step-through pattern, Decreased stride length, Trunk flexed       General Gait Details: Cues for posture, RW proximity. Steady gait. No LOB with RW use.   Stairs Stairs: Yes Stairs assistance: Min assist Stair Management: Step to pattern, Forwards, With walker Number of Stairs: 3 General stair comments: up and down portable stairs with RW. Husband present to observe and practice with pt/stabilize RW. Cues for safety, technqiue, sequence.   Wheelchair Mobility    Modified Rankin (Stroke Patients Only)       Balance Overall balance assessment: Needs assistance         Standing balance support: Bilateral upper extremity supported, During functional activity, Reliant on assistive device for balance Standing balance-Leahy Scale: Fair                              Cognition Arousal/Alertness: Awake/alert Behavior During Therapy: WFL for tasks assessed/performed Overall Cognitive Status: Impaired/Different from baseline Area of Impairment: Problem solving, Memory                     Memory: Decreased short-term memory   Safety/Judgement: Decreased awareness of safety   Problem Solving: Requires verbal cues          Exercises Total Joint Exercises Ankle Circles/Pumps: AROM, Both, 10 reps Quad Sets: AAROM, Right, 10 reps Heel Slides: AROM, AAROM, Right, 10 reps Straight Leg Raises: AROM, AAROM, Right, 10 reps Goniometric ROM: ~12-70 degrees    General Comments        Pertinent Vitals/Pain Pain Assessment Pain  Assessment: 0-10 Pain Score: 6  Pain Location: R knee Pain Descriptors / Indicators: Discomfort, Sore Pain Intervention(s): Monitored during session, Repositioned    Home Living                          Prior Function            PT Goals (current goals can  now be found in the care plan section) Progress towards PT goals: Progressing toward goals    Frequency    7X/week      PT Plan Current plan remains appropriate    Co-evaluation              AM-PAC PT "6 Clicks" Mobility   Outcome Measure  Help needed turning from your back to your side while in a flat bed without using bedrails?: A Little Help needed moving from lying on your back to sitting on the side of a flat bed without using bedrails?: A Little Help needed moving to and from a bed to a chair (including a wheelchair)?: A Little Help needed standing up from a chair using your arms (e.g., wheelchair or bedside chair)?: A Little Help needed to walk in hospital room?: A Little Help needed climbing 3-5 steps with a railing? : A Little 6 Click Score: 18    End of Session Equipment Utilized During Treatment: Gait belt Activity Tolerance: Patient tolerated treatment well Patient left: in chair;with call bell/phone within reach;with family/visitor present   PT Visit Diagnosis: Pain;Other abnormalities of gait and mobility (R26.89) Pain - Right/Left: Right Pain - part of body: Knee     Time: 1335-1346 PT Time Calculation (min) (ACUTE ONLY): 11 min  Charges:  $Gait Training: 8-22 mins $Therapeutic Exercise: 8-22 mins                         Doreatha Massed, PT Acute Rehabilitation  Office: [phone] Pager: [phone]

## 2021-06-10 NOTE — Progress Notes (Signed)
Physical Therapy Treatment Patient Details Name: Cynthia [name] MRN: [id number] DOB: [birth year] Today's Date: 06/10/2021   History of Present Illness 81 y.o. female admitted 06/05/21 for R TKA. PMH includes: hypotension, normal pressure hydocephalus, polyneuropathy, limb movement d/o, acoustic neuroma excision.    PT Comments    Improved performance and activity tolerance on today! She still has some forgetfulness and requires cues for safety. Moderate pain with activity-rated 6/10. No family present during session. Will plan to have a 2nd session to practice stair negotiation.     Recommendations for follow up therapy are one component of a multi-disciplinary discharge planning process, led by the attending physician.  Recommendations may be updated based on patient status, additional functional criteria and insurance authorization.  Follow Up Recommendations  Follow physician's recommendations for discharge plan and follow up therapies (family is requesting [facility])     Assistance Recommended at Discharge Frequent or constant Supervision/Assistance  Patient can return home with the following A little help with walking and/or transfers;A little help with bathing/dressing/bathroom;Assist for transportation;Assistance with cooking/housework;Help with stairs or ramp for entrance   Equipment Recommendations  None recommended by PT    Recommendations for Other Services       Precautions / Restrictions Precautions Precautions: Fall;Knee Precaution Comments: instructed pt in no pillow under knee and to try to keep knee straight when at rest (in bed, in recliner) Restrictions Weight Bearing Restrictions: No RLE Weight Bearing: Weight bearing as tolerated     Mobility  Bed Mobility               General bed mobility comments: oob in recliner    Transfers Overall transfer level: Needs assistance Equipment used: Rolling walker (2 wheels) Transfers: Sit to/from Stand Sit to Stand:  Min guard           General transfer comment: Min guard for safety. Cues for safety, hand placement.    Ambulation/Gait Ambulation/Gait assistance: Min guard Gait Distance (Feet): 100 Feet Assistive device: Rolling walker (2 wheels) Gait Pattern/deviations: Step-through pattern, Decreased stride length, Trunk flexed       General Gait Details: Cues for posture, RW proximity. Steady gait. No LOB with RW use. Pt denied dizziness. HR 98 bpm   Stairs             Wheelchair Mobility    Modified Rankin (Stroke Patients Only)       Balance Overall balance assessment: Needs assistance         Standing balance support: Bilateral upper extremity supported, During functional activity, Reliant on assistive device for balance Standing balance-Leahy Scale: Fair                              Cognition Arousal/Alertness: Awake/alert Behavior During Therapy: WFL for tasks assessed/performed Overall Cognitive Status: No family/caregiver present to determine baseline cognitive functioning Area of Impairment: Problem solving, Memory                     Memory: Decreased short-term memory       Problem Solving: Requires verbal cues          Exercises Total Joint Exercises Ankle Circles/Pumps: AROM, Both, 10 reps Quad Sets: AAROM, Right, 10 reps Heel Slides: AROM, AAROM, Right, 10 reps Straight Leg Raises: AROM, AAROM, Right, 10 reps Goniometric ROM: ~12-70 degrees    General Comments        Pertinent Vitals/Pain Pain Assessment Pain Assessment: 0-10  Pain Score: 6  Pain Location: R knee Pain Descriptors / Indicators: Discomfort, Sore Pain Intervention(s): Monitored during session, Repositioned, Ice applied    Home Living                          Prior Function            PT Goals (current goals can now be found in the care plan section) Progress towards PT goals: Progressing toward goals    Frequency     7X/week      PT Plan Current plan remains appropriate    Co-evaluation              AM-PAC PT "6 Clicks" Mobility   Outcome Measure  Help needed turning from your back to your side while in a flat bed without using bedrails?: A Little Help needed moving from lying on your back to sitting on the side of a flat bed without using bedrails?: A Little Help needed moving to and from a bed to a chair (including a wheelchair)?: A Little Help needed standing up from a chair using your arms (e.g., wheelchair or bedside chair)?: A Little Help needed to walk in hospital room?: A Little Help needed climbing 3-5 steps with a railing? : A Lot 6 Click Score: 17    End of Session Equipment Utilized During Treatment: Gait belt Activity Tolerance: Patient tolerated treatment well Patient left: in chair;with call bell/phone within reach   PT Visit Diagnosis: Pain;Other abnormalities of gait and mobility (R26.89) Pain - Right/Left: Right Pain - part of body: Knee     Time: 9628-3662 PT Time Calculation (min) (ACUTE ONLY): 31 min  Charges:  $Gait Training: 8-22 mins $Therapeutic Exercise: 8-22 mins                         Doreatha Massed, PT Acute Rehabilitation  Office: [phone] Pager: [phone]

## 2021-06-10 NOTE — Progress Notes (Signed)
° ° °  Subjective:  Patient reports pain as mild to moderate.  Denies N/VCP/SOB.   Objective:   VITALS:   Vitals:   06/09/21 1901 06/09/21 2054 06/10/21 0306 06/10/21 0627  BP: (!) 144/69 130/64  (!) 148/82  Pulse: 73 80  82  Resp: 20 18  20   Temp: 99.3 F (37.4 C) 99.4 F (37.4 C)  98.6 F (37 C)  TempSrc: Oral Oral  Oral  SpO2: 92% 94%  91%  Weight:   71.8 kg   Height:        NAD ABD soft Sensation intact distally Intact pulses distally Dorsiflexion/Plantar flexion intact Incision: dressing C/D/I Compartment soft   Lab Results  Component Value Date   WBC 12.4 (H) 06/08/2021   HGB 8.9 (L) 06/08/2021   HCT 26.5 (L) 06/08/2021   MCV 95.3 06/08/2021   PLT 141 (L) 06/08/2021   BMET    Component Value Date/Time   NA 137 06/06/2021 0323   K 4.0 06/06/2021 0323   CL 108 06/06/2021 0323   CO2 23 06/06/2021 0323   GLUCOSE 156 (H) 06/06/2021 0323   BUN 24 (H) 06/06/2021 0323   CREATININE 0.69 06/06/2021 0323   CALCIUM 8.6 (L) 06/06/2021 0323   GFRNONAA >60 06/06/2021 0323     Assessment/Plan: 5 Days Post-Op   Principal Problem:   Degenerative arthritis of right knee Active Problems:   Osteoarthritis of right knee   WBAT RLE with walker DVT ppx: Aspirin, SCDs, TEDS PO pain control PT/OT N/V: resolved since stopping narcotics, cont tramadol PRN AF RVR: resolved, Dr. Einar Gip has made d/c recs, appreciate assistance Dispo: D/C home with OPPT after clears PT  Cynthia [name] Cynthia [name] 06/10/2021, 1:45 PM   Rod Can, MD [phone] [facility]   [place] [address], [address], [place], [place] [address] Phone: [phone] [email or web address]   [employer]

## 2021-06-10 NOTE — Progress Notes (Signed)
Reviewed d/c instructions with pt, spouse and daighter via phone, pt assisted to bathroom before d/c, no concerns noted.  PIV d/c, dressing placed.

## 2021-06-10 NOTE — Plan of Care (Signed)
  Problem: Pain Managment: Goal: General experience of comfort will improve Outcome: Progressing   

## 2021-06-11 DIAGNOSIS — M25561 Pain in right knee: Secondary | ICD-10-CM | POA: Diagnosis not present

## 2021-06-11 DIAGNOSIS — M25661 Stiffness of right knee, not elsewhere classified: Secondary | ICD-10-CM | POA: Diagnosis not present

## 2021-06-14 ENCOUNTER — Telehealth: Payer: Self-pay | Admitting: Cardiology

## 2021-06-14 DIAGNOSIS — Z95818 Presence of other cardiac implants and grafts: Secondary | ICD-10-CM | POA: Diagnosis not present

## 2021-06-14 DIAGNOSIS — R55 Syncope and collapse: Secondary | ICD-10-CM | POA: Diagnosis not present

## 2021-06-14 DIAGNOSIS — I4819 Other persistent atrial fibrillation: Secondary | ICD-10-CM

## 2021-06-14 DIAGNOSIS — Z4509 Encounter for adjustment and management of other cardiac device: Secondary | ICD-10-CM | POA: Diagnosis not present

## 2021-06-14 MED ORDER — AMIODARONE HCL 200 MG PO TABS: 200.0000 mg | ORAL_TABLET | Freq: Two times a day (BID) | ORAL | 2 refills | Status: DC

## 2021-06-14 NOTE — Telephone Encounter (Signed)
Remote loop recorder transmission reveals persistent atrial fibrillation again starting 06/11/2021 with RVR.  We will go ahead and start amiodarone 200 mg twice daily until next office visit and reduce it to 200 mg daily, patient has appointment on 06/25/2021. ? ?  ICD-10-CM   ?1. Persistent atrial fibrillation (HCC)  I48.19 amiodarone (PACERONE) 200 MG tablet  ?  ? ?Meds ordered this encounter  ?Medications  ? amiodarone (PACERONE) 200 MG tablet  ?  Sig: Take 1 tablet (200 mg total) by mouth 2 (two) times daily.  ?  Dispense:  60 tablet  ?  Refill:  2  ?  ?

## 2021-06-16 NOTE — Telephone Encounter (Signed)
Patient is aware of amiodarone and has started it.

## 2021-06-16 NOTE — Telephone Encounter (Signed)
Attempted to LVM but inbox is full.

## 2021-06-16 NOTE — Telephone Encounter (Signed)
LVM on patient's husband phone for her to call the office to discuss further.

## 2021-06-18 DIAGNOSIS — Z09 Encounter for follow-up examination after completed treatment for conditions other than malignant neoplasm: Secondary | ICD-10-CM | POA: Diagnosis not present

## 2021-06-18 DIAGNOSIS — Z96651 Presence of right artificial knee joint: Secondary | ICD-10-CM | POA: Diagnosis not present

## 2021-06-18 DIAGNOSIS — M25561 Pain in right knee: Secondary | ICD-10-CM | POA: Diagnosis not present

## 2021-06-18 DIAGNOSIS — I4891 Unspecified atrial fibrillation: Secondary | ICD-10-CM | POA: Diagnosis not present

## 2021-06-18 DIAGNOSIS — D508 Other iron deficiency anemias: Secondary | ICD-10-CM | POA: Diagnosis not present

## 2021-06-18 DIAGNOSIS — M25661 Stiffness of right knee, not elsewhere classified: Secondary | ICD-10-CM | POA: Diagnosis not present

## 2021-06-20 DIAGNOSIS — M25661 Stiffness of right knee, not elsewhere classified: Secondary | ICD-10-CM | POA: Diagnosis not present

## 2021-06-20 DIAGNOSIS — Z471 Aftercare following joint replacement surgery: Secondary | ICD-10-CM | POA: Diagnosis not present

## 2021-06-20 DIAGNOSIS — Z96651 Presence of right artificial knee joint: Secondary | ICD-10-CM | POA: Diagnosis not present

## 2021-06-20 DIAGNOSIS — Z4789 Encounter for other orthopedic aftercare: Secondary | ICD-10-CM | POA: Diagnosis not present

## 2021-06-20 DIAGNOSIS — M25561 Pain in right knee: Secondary | ICD-10-CM | POA: Diagnosis not present

## 2021-06-20 DIAGNOSIS — E739 Lactose intolerance, unspecified: Secondary | ICD-10-CM | POA: Insufficient documentation

## 2021-06-20 DIAGNOSIS — Z96659 Presence of unspecified artificial knee joint: Secondary | ICD-10-CM | POA: Insufficient documentation

## 2021-06-23 DIAGNOSIS — M25561 Pain in right knee: Secondary | ICD-10-CM | POA: Diagnosis not present

## 2021-06-23 DIAGNOSIS — M25661 Stiffness of right knee, not elsewhere classified: Secondary | ICD-10-CM | POA: Diagnosis not present

## 2021-06-24 NOTE — Progress Notes (Signed)
? ?Primary Physician/Referring:  Deland Pretty, MD ? ?[name], female    DOB: 1941/02/06, 81 y.o.   MRN: [id number] ? ?Chief Complaint  ?Patient presents with  ? Follow-up  ? Hospitalization Follow-up  ? Atrial Fibrillation  ? ?HPI:   ? ?Cynthia [name]  is a 81 y.o. Caucasian female with hypertension, hyperlipidemia, frequent UTI,  paroxysmal atrial fibrillation, atrial tachycardia, obstructive sleep apnea on CPAP. Tolerating anticoagulation well. Flecainide discontinued due to dizziness. In the past she has had recurrent episodes of near syncope and severe orthostatic changes with recurrent UTI.  However patient with episodes of syncope and near syncope not associated with UTI.  Patient underwent surgery to her right foot 03/04/2020 and had an episode of syncope while at rehabilitation facility on 04/10/2020.  ? ?Patient had recuperated well.  Recent loop recorder transmission revealed persistent atrial fibrillation with RVR started 06/11/2021.  Patient was therefore started on amiodarone 200 mg twice daily and now presents for follow up.  Patient is tolerating addition of amiodarone without issue.  Patient recently had knee surgery and is still recovering, completing physical therapy.  She is asymptomatic from a cardiovascular standpoint.  Denies chest pain, palpitations, dyspnea, syncope, near syncope. ? ?Patient continues to tolerate anticoagulation without bleeding diathesis. ? ?Past Medical History:  ?Diagnosis Date  ? Acute GI bleeding 10/27/2017  ? Arthritis   ? "joints" (11/10/2017)  ? Dizziness   ? Dysrhythmia   ? afib  ? Encounter for loop recorder check 06/09/2020  ? GERD (gastroesophageal reflux disease)   ? GI bleed 11/06/2017  ? Hearing loss   ? History of blood transfusion 10/2017  ? Hyperlipidemia   ? Hypertension   ? Hyperthyroidism   ? Loop recorder [employer] Loop Recorder 05/23/2020 05/23/2020  ? Melanoma ([place])   ? "cut off my back"  ? Memory loss   ? Pneumonia   ? Sleep apnea   ?  cpap  ? Syncope and collapse   ? ?Past Surgical History:  ?Procedure Laterality Date  ? ABDOMINAL HYSTERECTOMY    ? "partial"  ? APPENDECTOMY    ? BIOPSY  11/17/2017  ? Procedure: BIOPSY;  Surgeon: Clarene Essex, MD;  Location: [facility];  Service: Endoscopy;;  ? COLONOSCOPY WITH PROPOFOL N/A 11/17/2017  ? Procedure: COLONOSCOPY WITH PROPOFOL;  Surgeon: Clarene Essex, MD;  Location: [facility];  Service: Endoscopy;  Laterality: N/A;  ? CRANIECTOMY FOR EXCISION OF ACOUSTIC NEUROMA    ? ESOPHAGOGASTRODUODENOSCOPY (EGD) WITH PROPOFOL N/A 10/28/2017  ? Procedure: ESOPHAGOGASTRODUODENOSCOPY (EGD) WITH PROPOFOL;  Surgeon: Clarene Essex, MD;  Location: [facility];  Service: Endoscopy;  Laterality: N/A;  ? FOOT SURGERY Bilateral 04/18/2020  ? KNEE ARTHROPLASTY Right 06/05/2021  ? Procedure: COMPUTER ASSISTED TOTAL KNEE ARTHROPLASTY;  Surgeon: Rod Can, MD;  Location: [facility];  Service: Orthopedics;  Laterality: Right;  ? KNEE ARTHROSCOPY Left   ? LEFT HEART CATH AND CORONARY ANGIOGRAPHY N/A 11/24/2016  ? Procedure: LEFT HEART CATH AND CORONARY ANGIOGRAPHY;  Surgeon: Jettie Booze, MD;  Location: [facility];  Service: Cardiovascular;  Laterality: N/A;  ? MELANOMA EXCISION    ? "off my back"  ? NASAL SINUS SURGERY    ? TONSILLECTOMY    ? ?Family History  ?Problem Relation Age of Onset  ? Congestive Heart Failure Mother   ? Heart disease Father   ?     History of heart attacks at a later age.    ? Heart disease Brother   ?  Hearing loss Brother   ?  ?Social History  ? ?Tobacco Use  ? Smoking status: Former  ?  Years: 2.00  ?  Types: Cigarettes  ?  Quit date: 78  ?  Years since quitting: 43.2  ? Smokeless tobacco: Never  ? Tobacco comments:  ?  11/10/2017 "only smoked when I was on my period"  ?Substance Use Topics  ? Alcohol use: Yes  ?  Comment: wine on occas  ?Marital Status: Married  ? ?ROS  ?Review of Systems  ?Constitutional: Negative for malaise/fatigue and weight gain.  ?Cardiovascular:  Negative for  chest pain, claudication, dyspnea on exertion, leg swelling, near-syncope, orthopnea, palpitations, paroxysmal nocturnal dyspnea and syncope.  ?Respiratory:  Negative for shortness of breath.   ?Neurological:  Negative for dizziness.  ? ?Objective  ?Blood pressure 132/74, pulse 74, temperature 98.4 ?F (36.9 ?C), temperature source Temporal, resp. rate 17, height '5\' 4"'  (1.626 m), weight 135 lb 6.4 oz (61.4 kg), SpO2 98 %.  ?Vitals with BMI 06/25/2021 06/10/2021 06/10/2021  ?Height '5\' 4"'  - -  ?Weight 135 lbs 6 oz - -  ?BMI 23.23 - -  ?Systolic 387 564 332  ?Diastolic 74 70 82  ?Pulse 74 80 82  ? ? Physical Exam ?Vitals reviewed.  ?Constitutional:   ?   General: She is not in acute distress. ?   Comments: Patient using walker today.  ?Cardiovascular:  ?   Rate and Rhythm: Normal rate and regular rhythm.  ?   Pulses: Intact distal pulses.  ?   Heart sounds: Normal heart sounds, S1 normal and S2 normal. No murmur heard. ?  No gallop.  ?   Comments: No JVD. ?Pulmonary:  ?   Effort: Pulmonary effort is normal. No respiratory distress.  ?   Breath sounds: Normal breath sounds. No wheezing, rhonchi or rales.  ?Musculoskeletal:  ?   Right lower leg: No edema.  ?   Left lower leg: No edema.  ?Neurological:  ?   Mental Status: She is alert.  ? ?Laboratory examination:  ? ?Recent Labs  ?  12/25/20 ?2112 06/02/21 ?0950 06/06/21 ?0323  ?NA 135 134* 137  ?K 3.4* 3.8 4.0  ?CL 99 100 108  ?CO2 '25 28 23  ' ?GLUCOSE 115* 90 156*  ?BUN 31* 25* 24*  ?CREATININE 0.91 0.75 0.69  ?CALCIUM 9.1 9.1 8.6*  ?GFRNONAA >60 >60 >60  ? ?estimated creatinine clearance is 49.2 mL/min (by C-G formula based on SCr of 0.69 mg/dL).  ?CMP Latest Ref Rng & Units 06/06/2021 06/02/2021 12/25/2020  ?Glucose 70 - 99 mg/dL 156(H) 90 115(H)  ?BUN 8 - 23 mg/dL 24(H) 25(H) 31(H)  ?Creatinine 0.44 - 1.00 mg/dL 0.69 0.75 0.91  ?Sodium 135 - 145 mmol/L 137 134(L) 135  ?Potassium 3.5 - 5.1 mmol/L 4.0 3.8 3.4(L)  ?Chloride 98 - 111 mmol/L 108 100 99  ?CO2 22 - 32 mmol/L '23 28  25  ' ?Calcium 8.9 - 10.3 mg/dL 8.6(L) 9.1 9.1  ?Total Protein 6.5 - 8.1 g/dL - - -  ?Total Bilirubin 0.3 - 1.2 mg/dL - - -  ?Alkaline Phos 38 - 126 U/L - - -  ?AST 15 - 41 U/L - - -  ?ALT 0 - 44 U/L - - -  ? ?CBC Latest Ref Rng & Units 06/08/2021 06/07/2021 06/06/2021  ?WBC 4.0 - 10.5 K/uL 12.4(H) 14.3(H) 15.6(H)  ?Hemoglobin 12.0 - 15.0 g/dL 8.9(L) 8.7(L) 9.5(L)  ?Hematocrit 36.0 - 46.0 % 26.5(L) 26.3(L) 28.5(L)  ?Platelets 150 - 400  K/uL 141(L) 136(L) 145(L)  ? ?Lipid Panel  ?   ?Component Value Date/Time  ? CHOL 165 11/24/2016 0040  ? TRIG 88 11/24/2016 0040  ? HDL 61 11/24/2016 0040  ? CHOLHDL 2.7 11/24/2016 0040  ? VLDL 18 11/24/2016 0040  ? [place] 86 11/24/2016 0040  ? ?HEMOGLOBIN A1C ?Lab Results  ?Component Value Date  ? HGBA1C 5.6 11/24/2016  ? MPG 114 11/24/2016  ? ?TSH ?No results for input(s): TSH in the last 8760 hours. ? ? ?External labs:  ?06/13/2020: ?Glucose 89, sodium 141, potassium 3.8, BUN 34, creatinine 0.8, GFR >60, AST 16, ALT 23, alk phos 40 ? ?03/11/2020: ?Hemoglobin 10.9, hematocrit 34.2, platelet 241, MCV 94.7 ?Total cholesterol 165, triglycerides 56, HDL 65, LDL 89 ?TSH 0.24 ? ?Cholesterol, total 145.000 m 09/01/2019 ?HDL 67.000 mg 10/13/2019 ?LDL-C 94.000 cal 10/13/2019 ?Triglycerides 80.000 mg 10/13/2019 ? ?Hemoglobin 10.900 g/d 01/24/2020 ?INR 1.160 11/06/2017 ?Platelets 197.000 K/ 01/24/2020 ? ?Creatinine, Serum 0.580 mg/ 01/24/2020 ?Potassium 3.500 mm 01/24/2020 ?ALT (SGPT) 15.000 IU/ 09/01/2019 ? ?TSH 0.870 10/13/2019 ? ?Hemoglobin 9.300 12/14/2018;  INR 1.160 11/06/2017 ?Platelets 188.000 12/14/2018 ? ?Creatinine, Serum 0.970 12/14/2018 ?Potassium 4.000 12/14/2018 ?Magnesium N/D ?ALT (SGPT) 15.000 12/14/2018 ? ?Labs 11/02/2018: BUN 26, creatinine 0.9, EGFR greater than 60 mL, Hb 10.6/HCT 32.2, platelets 244.  ? ?Allergies  ? ?Allergies  ?Allergen Reactions  ? Flecainide Other (See Comments)  ?  Severe dizziness  ? Avelox [Moxifloxacin Hcl In Nacl] Other (See Comments)  ?  Unknown reaction  ? Ciprofloxacin   ?   Other reaction(s): multiple medication interactions  ? Other Other (See Comments)  ?  Avelox+cymbalta Together ?Other reaction(s): flu like symptoms  ? Oxycodone Other (See Comments)  ?  uphoria  ? Trime

## 2021-06-25 ENCOUNTER — Encounter: Payer: Self-pay | Admitting: Student

## 2021-06-25 ENCOUNTER — Other Ambulatory Visit: Payer: Self-pay

## 2021-06-25 ENCOUNTER — Ambulatory Visit: Payer: Medicare PPO | Admitting: Student

## 2021-06-25 VITALS — BP 132/74 | HR 74 | Temp 98.4°F | Resp 17 | Ht 64.0 in | Wt 135.4 lb

## 2021-06-25 DIAGNOSIS — E782 Mixed hyperlipidemia: Secondary | ICD-10-CM | POA: Diagnosis not present

## 2021-06-25 DIAGNOSIS — Z79899 Other long term (current) drug therapy: Secondary | ICD-10-CM

## 2021-06-25 DIAGNOSIS — I951 Orthostatic hypotension: Secondary | ICD-10-CM

## 2021-06-25 DIAGNOSIS — I4819 Other persistent atrial fibrillation: Secondary | ICD-10-CM | POA: Diagnosis not present

## 2021-06-25 DIAGNOSIS — I10 Essential (primary) hypertension: Secondary | ICD-10-CM | POA: Diagnosis not present

## 2021-06-25 MED ORDER — AMIODARONE HCL 200 MG PO TABS: 200.0000 mg | ORAL_TABLET | Freq: Every day | ORAL | 2 refills | Status: DC

## 2021-06-26 DIAGNOSIS — M25561 Pain in right knee: Secondary | ICD-10-CM | POA: Diagnosis not present

## 2021-06-26 DIAGNOSIS — M25661 Stiffness of right knee, not elsewhere classified: Secondary | ICD-10-CM | POA: Diagnosis not present

## 2021-06-27 DIAGNOSIS — N390 Urinary tract infection, site not specified: Secondary | ICD-10-CM | POA: Diagnosis not present

## 2021-06-27 DIAGNOSIS — R351 Nocturia: Secondary | ICD-10-CM | POA: Diagnosis not present

## 2021-06-27 DIAGNOSIS — R350 Frequency of micturition: Secondary | ICD-10-CM | POA: Diagnosis not present

## 2021-06-27 DIAGNOSIS — N952 Postmenopausal atrophic vaginitis: Secondary | ICD-10-CM | POA: Diagnosis not present

## 2021-06-27 DIAGNOSIS — Z87440 Personal history of urinary (tract) infections: Secondary | ICD-10-CM | POA: Diagnosis not present

## 2021-06-30 DIAGNOSIS — M25561 Pain in right knee: Secondary | ICD-10-CM | POA: Diagnosis not present

## 2021-06-30 DIAGNOSIS — M25661 Stiffness of right knee, not elsewhere classified: Secondary | ICD-10-CM | POA: Diagnosis not present

## 2021-07-02 ENCOUNTER — Other Ambulatory Visit: Payer: Self-pay

## 2021-07-02 MED ORDER — DILTIAZEM HCL ER COATED BEADS 120 MG PO CP24: 120.0000 mg | ORAL_CAPSULE | Freq: Every day | ORAL | 1 refills | Status: DC

## 2021-07-10 DIAGNOSIS — M25561 Pain in right knee: Secondary | ICD-10-CM | POA: Diagnosis not present

## 2021-07-10 DIAGNOSIS — M25661 Stiffness of right knee, not elsewhere classified: Secondary | ICD-10-CM | POA: Diagnosis not present

## 2021-07-14 DIAGNOSIS — R5381 Other malaise: Secondary | ICD-10-CM | POA: Diagnosis not present

## 2021-07-14 DIAGNOSIS — E039 Hypothyroidism, unspecified: Secondary | ICD-10-CM | POA: Diagnosis not present

## 2021-07-14 DIAGNOSIS — Z79899 Other long term (current) drug therapy: Secondary | ICD-10-CM | POA: Diagnosis not present

## 2021-07-14 DIAGNOSIS — R5383 Other fatigue: Secondary | ICD-10-CM | POA: Diagnosis not present

## 2021-07-14 DIAGNOSIS — I480 Paroxysmal atrial fibrillation: Secondary | ICD-10-CM | POA: Diagnosis not present

## 2021-07-15 DIAGNOSIS — R55 Syncope and collapse: Secondary | ICD-10-CM | POA: Diagnosis not present

## 2021-07-15 DIAGNOSIS — M7542 Impingement syndrome of left shoulder: Secondary | ICD-10-CM | POA: Diagnosis not present

## 2021-07-15 DIAGNOSIS — Z4509 Encounter for adjustment and management of other cardiac device: Secondary | ICD-10-CM | POA: Diagnosis not present

## 2021-07-15 DIAGNOSIS — Z95818 Presence of other cardiac implants and grafts: Secondary | ICD-10-CM | POA: Diagnosis not present

## 2021-07-15 DIAGNOSIS — Z96651 Presence of right artificial knee joint: Secondary | ICD-10-CM | POA: Diagnosis not present

## 2021-07-15 DIAGNOSIS — Z471 Aftercare following joint replacement surgery: Secondary | ICD-10-CM | POA: Diagnosis not present

## 2021-07-17 ENCOUNTER — Encounter: Payer: Self-pay | Admitting: Student

## 2021-07-17 ENCOUNTER — Ambulatory Visit: Payer: Medicare PPO | Admitting: Student

## 2021-07-17 VITALS — BP 140/63 | HR 67 | Temp 98.0°F | Resp 17 | Ht 64.0 in | Wt 137.0 lb

## 2021-07-17 DIAGNOSIS — I480 Paroxysmal atrial fibrillation: Secondary | ICD-10-CM

## 2021-07-17 DIAGNOSIS — I4819 Other persistent atrial fibrillation: Secondary | ICD-10-CM | POA: Diagnosis not present

## 2021-07-17 NOTE — Progress Notes (Signed)
? ?Primary Physician/Referring:  Deland Pretty, MD ? ?[name], female    DOB: 15-Jan-1941, 81 y.o.   MRN: [id number] ? ?Chief Complaint  ?Patient presents with  ? Atrial Fibrillation  ? Follow-up  ? ?HPI:   ? ?Cynthia [name]  is a 81 y.o. Caucasian female with hypertension, hyperlipidemia, frequent UTI,  paroxysmal atrial fibrillation, atrial tachycardia, obstructive sleep apnea on CPAP. Tolerating anticoagulation well. Flecainide discontinued due to dizziness. In the past she has had recurrent episodes of near syncope and severe orthostatic changes with recurrent UTI.  However patient with episodes of syncope and near syncope not associated with UTI.  Patient underwent surgery to her right foot 03/04/2020 and had an episode of syncope while at rehabilitation facility on 04/10/2020.  ? ?Recorder transmission 06/2021 revealed persistent atrial fibrillation with RVR, patient was therefore started on amiodarone which was reduced to 200 mg once daily at last office visit.  Unfortunately TSH and CMP have not been done as previously ordered. ? ?Patient now presents for urgent visit as loop recorder had again revealed paroxysmal atrial fibrillation with RVR.  Patient remains fairly asymptomatic from a cardiac standpoint.  Denies chest pain, palpitations, dyspnea, dizziness, syncope, near syncope.  She has had poor appetite over the last couple weeks and noticed fatigue over the same period of time. ? ?Patient continues to tolerate anticoagulation without bleeding diathesis. ? ?Primary concern today of both patient and her husband is worsening memory loss for the patient. ? ?Past Medical History:  ?Diagnosis Date  ? Acute GI bleeding 10/27/2017  ? Arthritis   ? "joints" (11/10/2017)  ? Dizziness   ? Dysrhythmia   ? afib  ? Encounter for loop recorder check 06/09/2020  ? GERD (gastroesophageal reflux disease)   ? GI bleed 11/06/2017  ? Hearing loss   ? History of blood transfusion 10/2017  ? Hyperlipidemia   ?  Hypertension   ? Hyperthyroidism   ? Loop recorder Biotronik Loop Recorder 05/23/2020 05/23/2020  ? Melanoma ([place])   ? "cut off my back"  ? Memory loss   ? Pneumonia   ? Sleep apnea   ? cpap  ? Syncope and collapse   ? ?Past Surgical History:  ?Procedure Laterality Date  ? ABDOMINAL HYSTERECTOMY    ? "partial"  ? APPENDECTOMY    ? BIOPSY  11/17/2017  ? Procedure: BIOPSY;  Surgeon: Clarene Essex, MD;  Location: [facility];  Service: Endoscopy;;  ? COLONOSCOPY WITH PROPOFOL N/A 11/17/2017  ? Procedure: COLONOSCOPY WITH PROPOFOL;  Surgeon: Clarene Essex, MD;  Location: [facility];  Service: Endoscopy;  Laterality: N/A;  ? CRANIECTOMY FOR EXCISION OF ACOUSTIC NEUROMA    ? ESOPHAGOGASTRODUODENOSCOPY (EGD) WITH PROPOFOL N/A 10/28/2017  ? Procedure: ESOPHAGOGASTRODUODENOSCOPY (EGD) WITH PROPOFOL;  Surgeon: Clarene Essex, MD;  Location: [facility];  Service: Endoscopy;  Laterality: N/A;  ? FOOT SURGERY Bilateral 04/18/2020  ? KNEE ARTHROPLASTY Right 06/05/2021  ? Procedure: COMPUTER ASSISTED TOTAL KNEE ARTHROPLASTY;  Surgeon: Rod Can, MD;  Location: [facility];  Service: Orthopedics;  Laterality: Right;  ? KNEE ARTHROSCOPY Left   ? LEFT HEART CATH AND CORONARY ANGIOGRAPHY N/A 11/24/2016  ? Procedure: LEFT HEART CATH AND CORONARY ANGIOGRAPHY;  Surgeon: Jettie Booze, MD;  Location: [facility];  Service: Cardiovascular;  Laterality: N/A;  ? MELANOMA EXCISION    ? "off my back"  ? NASAL SINUS SURGERY    ? TONSILLECTOMY    ? ?Family History  ?Problem Relation Age of Onset  ?  Congestive Heart Failure Mother   ? Heart disease Father   ?     History of heart attacks at a later age.    ? Heart disease Brother   ? Hearing loss Brother   ?  ?Social History  ? ?Tobacco Use  ? Smoking status: Former  ?  Years: 2.00  ?  Types: Cigarettes  ?  Quit date: 62  ?  Years since quitting: 43.2  ? Smokeless tobacco: Never  ? Tobacco comments:  ?  11/10/2017 "only smoked when I was on my period"  ?Substance Use Topics  ? Alcohol use:  Yes  ?  Comment: wine on occas  ?Marital Status: Married  ? ?ROS  ?Review of Systems  ?Constitutional: Positive for decreased appetite and malaise/fatigue. Negative for weight gain.  ?Cardiovascular:  Negative for chest pain, claudication, dyspnea on exertion, leg swelling, near-syncope, orthopnea, palpitations, paroxysmal nocturnal dyspnea and syncope.  ?Respiratory:  Negative for shortness of breath.   ?Neurological:  Negative for dizziness.  ? ?Objective  ?Blood pressure 140/63, pulse 67, temperature 98 ?F (36.7 ?C), temperature source Temporal, resp. rate 17, height '5\' 4"'  (1.626 m), weight 137 lb (62.1 kg), SpO2 100 %.  ? ?  07/17/2021  ? 11:10 AM 06/25/2021  ? 11:09 AM 06/10/2021  ?  1:00 PM  ?Vitals with BMI  ?Height '5\' 4"'  '5\' 4"'    ?Weight 137 lbs 135 lbs 6 oz   ?BMI 23.5 23.23   ?Systolic 778 242 353  ?Diastolic 63 74 70  ?Pulse 67 74 80  ? ? Physical Exam ?Vitals reviewed.  ?Constitutional:   ?   General: She is not in acute distress. ?   Comments: Patient using walker  ?Cardiovascular:  ?   Rate and Rhythm: Normal rate and regular rhythm.  ?   Pulses: Intact distal pulses.  ?   Heart sounds: Normal heart sounds, S1 normal and S2 normal. No murmur heard. ?  No gallop.  ?   Comments: No JVD. ?Pulmonary:  ?   Effort: Pulmonary effort is normal. No respiratory distress.  ?   Breath sounds: Normal breath sounds. No wheezing, rhonchi or rales.  ?Musculoskeletal:  ?   Right lower leg: No edema.  ?   Left lower leg: No edema.  ?Neurological:  ?   Mental Status: She is alert.  ? ?Laboratory examination:  ? ?Recent Labs  ?  12/25/20 ?2112 06/02/21 ?0950 06/06/21 ?0323  ?NA 135 134* 137  ?K 3.4* 3.8 4.0  ?CL 99 100 108  ?CO2 '25 28 23  ' ?GLUCOSE 115* 90 156*  ?BUN 31* 25* 24*  ?CREATININE 0.91 0.75 0.69  ?CALCIUM 9.1 9.1 8.6*  ?GFRNONAA >60 >60 >60  ? ?CrCl cannot be calculated (Patient's most recent lab result is older than the maximum 21 days allowed.).  ? ?  Latest Ref Rng & Units 06/06/2021  ?  3:23 AM 06/02/2021  ?  9:50  AM 12/25/2020  ?  9:12 PM  ?CMP  ?Glucose 70 - 99 mg/dL 156   90   115    ?BUN 8 - 23 mg/dL '24   25   31    ' ?Creatinine 0.44 - 1.00 mg/dL 0.69   0.75   0.91    ?Sodium 135 - 145 mmol/L 137   134   135    ?Potassium 3.5 - 5.1 mmol/L 4.0   3.8   3.4    ?Chloride 98 - 111 mmol/L 108   100  99    ?CO2 22 - 32 mmol/L '23   28   25    ' ?Calcium 8.9 - 10.3 mg/dL 8.6   9.1   9.1    ? ? ?  Latest Ref Rng & Units 06/08/2021  ?  3:43 AM 06/07/2021  ?  3:14 AM 06/06/2021  ?  3:23 AM  ?CBC  ?WBC 4.0 - 10.5 K/uL 12.4   14.3   15.6    ?Hemoglobin 12.0 - 15.0 g/dL 8.9   8.7   9.5    ?Hematocrit 36.0 - 46.0 % 26.5   26.3   28.5    ?Platelets 150 - 400 K/uL 141   136   145    ? ?Lipid Panel  ?   ?Component Value Date/Time  ? CHOL 165 11/24/2016 0040  ? TRIG 88 11/24/2016 0040  ? HDL 61 11/24/2016 0040  ? CHOLHDL 2.7 11/24/2016 0040  ? VLDL 18 11/24/2016 0040  ? [place] 86 11/24/2016 0040  ? ?HEMOGLOBIN A1C ?Lab Results  ?Component Value Date  ? HGBA1C 5.6 11/24/2016  ? MPG 114 11/24/2016  ? ?TSH ?No results for input(s): TSH in the last 8760 hours. ? ? ?External labs:  ?06/13/2020: ?Glucose 89, sodium 141, potassium 3.8, BUN 34, creatinine 0.8, GFR >60, AST 16, ALT 23, alk phos 40 ? ?03/11/2020: ?Hemoglobin 10.9, hematocrit 34.2, platelet 241, MCV 94.7 ?Total cholesterol 165, triglycerides 56, HDL 65, LDL 89 ?TSH 0.24 ? ?Cholesterol, total 145.000 m 09/01/2019 ?HDL 67.000 mg 10/13/2019 ?LDL-C 94.000 cal 10/13/2019 ?Triglycerides 80.000 mg 10/13/2019 ? ?Hemoglobin 10.900 g/d 01/24/2020 ?INR 1.160 11/06/2017 ?Platelets 197.000 K/ 01/24/2020 ? ?Creatinine, Serum 0.580 mg/ 01/24/2020 ?Potassium 3.500 mm 01/24/2020 ?ALT (SGPT) 15.000 IU/ 09/01/2019 ? ?TSH 0.870 10/13/2019 ? ?Hemoglobin 9.300 12/14/2018;  INR 1.160 11/06/2017 ?Platelets 188.000 12/14/2018 ? ?Creatinine, Serum 0.970 12/14/2018 ?Potassium 4.000 12/14/2018 ?Magnesium N/D ?ALT (SGPT) 15.000 12/14/2018 ? ?Labs 11/02/2018: BUN 26, creatinine 0.9, EGFR greater than 60 mL, Hb 10.6/HCT 32.2, platelets 244.   ? ?Allergies  ? ?Allergies  ?Allergen Reactions  ? Flecainide Other (See Comments)  ?  Severe dizziness  ? Avelox [Moxifloxacin Hcl In Nacl] Other (See Comments)  ?  Unknown reaction  ? Ciprofloxacin

## 2021-07-23 ENCOUNTER — Encounter: Payer: Self-pay | Admitting: Pulmonary Disease

## 2021-07-23 ENCOUNTER — Ambulatory Visit (INDEPENDENT_AMBULATORY_CARE_PROVIDER_SITE_OTHER): Payer: Medicare PPO | Admitting: Pulmonary Disease

## 2021-07-23 VITALS — BP 112/70 | HR 67 | Temp 97.7°F | Ht 65.0 in | Wt 141.8 lb

## 2021-07-23 DIAGNOSIS — G4733 Obstructive sleep apnea (adult) (pediatric): Secondary | ICD-10-CM | POA: Diagnosis not present

## 2021-07-23 NOTE — Patient Instructions (Addendum)
Start using your CPAP again ? ?Try and pay attention to what you are not tolerated about the machine ?-A lot of people may have mask issues ?-Some people may have pressure issues ? ?Use machine nightly ? ?I will see you back in about 2 months ? ?Call with significant concerns ? ?Your previous study did show that you were stopping breathing about 42 times an hour-it is important to try and treat this ?

## 2021-07-23 NOTE — Progress Notes (Signed)
? ?      ?Cynthia [name]    [id number]    July 02, 1940 ? ?Primary Care Physician:Pharr, Thayer Jew, MD ? ?Referring Physician: Deland Pretty, MD ?[place] [address] ? ?Chief complaint:   ?Patient being seen for obstructive sleep apnea ? ?HPI: ? ?Sleep study from call from neurology did reveal severe obstructive sleep apnea with an AHI of 42 ? ?Patient has not been using CPAP recently ?She basically just got away from using it ?Does not recollect what made her stop using it ? ?She is not resistant to trying it again ? ?Usually goes to bed between 1030 and 11 falls asleep in 20 to 30 minutes ?2-3 awakenings ?Final wake up time about 8:30 in the morning ? ?Background history of hypertension, angina, atrial fibrillation, hypercholesterolemia, melanoma ? ? ?Outpatient Encounter Medications as of 07/23/2021  ?Medication Sig  ? acetaminophen (TYLENOL) 500 MG tablet Take 1,000 mg by mouth at bedtime.  ? Alpha-Lipoic Acid (LIPOIC ACID PO) Take 600 mg by mouth daily.  ? amiodarone (PACERONE) 200 MG tablet Take 1 tablet (200 mg total) by mouth daily.  ? apixaban (ELIQUIS) 5 MG TABS tablet Take 1 tablet (5 mg total) by mouth 2 (two) times daily. With breakfast and dinner  ? atorvastatin (LIPITOR) 10 MG tablet TAKE 1 TABLET BY MOUTH EVERY DAY  ? AZO-CRANBERRY PO Take 1 tablet by mouth daily.  ? Calcium Carbonate-Vitamin D3 600-400 MG-UNIT TABS Take 1 tablet by mouth daily.  ? Cholecalciferol 25 MCG (1000 UT) tablet Take 1 tablet by mouth daily.  ? cycloSPORINE (RESTASIS) 0.05 % ophthalmic emulsion Place 1 drop into both eyes daily.  ? denosumab (PROLIA) 60 MG/ML SOSY injection Inject 60 mg into the skin every 6 (six) months.  ? desmopressin (DDAVP) 0.2 MG tablet Take 3 tablets by mouth as needed.  ? diltiazem (CARDIZEM CD) 120 MG 24 hr capsule Take 1 capsule (120 mg total) by mouth daily.  ? fluticasone (FLONASE) 50 MCG/ACT nasal spray Place 1 spray into the nose at bedtime.  ?  hydrochlorothiazide (HYDRODIURIL) 12.5 MG tablet Take 1 tablet (12.5 mg total) by mouth in the morning.  ? HYDROcodone-Acetaminophen 5-300 MG TABS Take 1 tablet by mouth as needed.  ? losartan (COZAAR) 100 MG tablet Take 100 mg by mouth every evening.  ? meclizine (ANTIVERT) 25 MG tablet Take 25 mg by mouth daily as needed for dizziness.  ? Melatonin 5 MG CAPS Take 5 mg by mouth at bedtime.   ? methocarbamol (ROBAXIN) 500 MG tablet Take 1 tablet (500 mg total) by mouth every 6 (six) hours as needed for muscle spasms.  ? metoCLOPramide (REGLAN) 5 MG tablet Take 1 tablet (5 mg total) by mouth every 8 (eight) hours as needed for nausea (if ondansetron (ZOFRAN) ineffective.).  ? metoprolol succinate (TOPROL-XL) 50 MG 24 hr tablet Take 1 tablet (50 mg total) by mouth daily.  ? Multiple Vitamin (MULTIVITAMIN WITH MINERALS) TABS tablet Take 1-2 tablets by mouth daily. flintstone vitamins Chewable with Iron  ? multivitamin-iron-minerals-folic acid (CENTRUM) chewable tablet Chew 1 tablet by mouth daily.  ? nitroGLYCERIN (NITROLINGUAL) 0.4 MG/SPRAY spray Place 1 spray under the tongue every 5 (five) minutes x 3 doses as needed for chest pain.  ? Omega-3 1000 MG CAPS Take 2,000 mg by mouth daily.   ? ondansetron (ZOFRAN) 4 MG tablet Take 4 mg by mouth every 8 (eight) hours as needed for nausea or vomiting.  ? pantoprazole (PROTONIX) 40 MG  tablet Take 40 mg by mouth daily before breakfast.   ? sertraline (ZOLOFT) 25 MG tablet Take 25 mg by mouth daily.  ? thyroid (ARMOUR) 60 MG tablet Take 60 mg by mouth daily before breakfast.  ? traMADol (ULTRAM) 50 MG tablet Take 1 tablet (50 mg total) by mouth every 6 (six) hours as needed for severe pain.  ? vitamin C (ASCORBIC ACID) 500 MG tablet Take 500 mg by mouth daily.   ? ?No facility-administered encounter medications on file as of 07/23/2021.  ? ? ?Allergies as of 07/23/2021 - Review Complete 07/23/2021  ?Allergen Reaction Noted  ? Flecainide Other (See Comments) 05/23/2020  ?  Oxycodone Other (See Comments) 06/03/2020  ? Trimethoprim Other (See Comments) 10/24/2020  ? Alendronate sodium Nausea Only 04/25/2018  ? Avelox [moxifloxacin hcl in nacl] Other (See Comments) 06/01/2018  ? Ciprofloxacin Other (See Comments) 12/23/2020  ? Other Other (See Comments) 01/24/2020  ? ? ?Past Medical History:  ?Diagnosis Date  ? Acute GI bleeding 10/27/2017  ? Arthritis   ? "joints" (11/10/2017)  ? Dizziness   ? Dysrhythmia   ? afib  ? Encounter for loop recorder check 06/09/2020  ? GERD (gastroesophageal reflux disease)   ? GI bleed 11/06/2017  ? Hearing loss   ? History of blood transfusion 10/2017  ? Hyperlipidemia   ? Hypertension   ? Hyperthyroidism   ? Loop recorder Biotronik Loop Recorder 05/23/2020 05/23/2020  ? Melanoma ([place])   ? "cut off my back"  ? Memory loss   ? Pneumonia   ? Sleep apnea   ? cpap  ? Syncope and collapse   ? ? ?Past Surgical History:  ?Procedure Laterality Date  ? ABDOMINAL HYSTERECTOMY    ? "partial"  ? APPENDECTOMY    ? BIOPSY  11/17/2017  ? Procedure: BIOPSY;  Surgeon: Clarene Essex, MD;  Location: [facility];  Service: Endoscopy;;  ? COLONOSCOPY WITH PROPOFOL N/A 11/17/2017  ? Procedure: COLONOSCOPY WITH PROPOFOL;  Surgeon: Clarene Essex, MD;  Location: [facility];  Service: Endoscopy;  Laterality: N/A;  ? CRANIECTOMY FOR EXCISION OF ACOUSTIC NEUROMA    ? ESOPHAGOGASTRODUODENOSCOPY (EGD) WITH PROPOFOL N/A 10/28/2017  ? Procedure: ESOPHAGOGASTRODUODENOSCOPY (EGD) WITH PROPOFOL;  Surgeon: Clarene Essex, MD;  Location[facility];  Service: Endoscopy;  Laterality: N/A;  ? FOOT SURGERY Bilateral 04/18/2020  ? KNEE ARTHROPLASTY Right 06/05/2021  ? Procedure: COMPUTER ASSISTED TOTAL KNEE ARTHROPLASTY;  Surgeon: Rod Can, MD;  Location: [facility];  Service: Orthopedics;  Laterality: Right;  ? KNEE ARTHROSCOPY Left   ? LEFT HEART CATH AND CORONARY ANGIOGRAPHY N/A 11/24/2016  ? Procedure: LEFT HEART CATH AND CORONARY ANGIOGRAPHY;  Surgeon: Jettie Booze, MD;  Location: [facility];  Service: Cardiovascular;  Laterality: N/A;  ? MELANOMA EXCISION    ? "off my back"  ? NASAL SINUS SURGERY    ? TONSILLECTOMY    ? ? ?Family History  ?Problem Relation Age of Onset  ? Congestive Heart Failure Mother   ? Heart disease Father   ?     History of heart attacks at a later age.    ? Heart disease Brother   ? Hearing loss Brother   ? ? ?Social History  ? ?Socioeconomic History  ? Marital status: Married  ?  Spouse name: Not on file  ? Number of children: 2  ? Years of education: Not on file  ? Highest education level: Not on file  ?Occupational History  ? Not on file  ?Tobacco  Use  ? Smoking status: Former  ?  Years: 2.00  ?  Types: Cigarettes  ?  Quit date: 13  ?  Years since quitting: 43.3  ? Smokeless tobacco: Never  ? Tobacco comments:  ?  11/10/2017 "only smoked when I was on my period"  ?Vaping Use  ? Vaping Use: Never used  ?Substance and Sexual Activity  ? Alcohol use: Yes  ?  Comment: wine on occas  ? Drug use: Never  ? Sexual activity: Not Currently  ?Other Topics Concern  ? Not on file  ?Social History Narrative  ? Right handed   ? ?Social Determinants of Health  ? ?Financial Resource Strain: Not on file  ?Food Insecurity: Not on file  ?Transportation Needs: Not on file  ?Physical Activity: Not on file  ?Stress: Not on file  ?Social Connections: Not on file  ?Intimate Partner Violence: Not on file  ? ? ?Review of Systems  ?Respiratory:  Positive for apnea.   ?Psychiatric/Behavioral:  Positive for sleep disturbance.   ? ?There were no vitals filed for this visit. ? ? ?Physical Exam ?Constitutional:   ?   Appearance: Normal appearance.  ?HENT:  ?   Head: Normocephalic.  ?   Mouth/Throat:  ?   Mouth: Mucous membranes are moist.  ?Cardiovascular:  ?   Rate and Rhythm: Normal rate and regular rhythm.  ?   Heart sounds: No murmur heard. ?  No friction rub.  ?Pulmonary:  ?   Effort: No respiratory distress.  ?   Breath sounds: No stridor. No wheezing or rhonchi.  ?Musculoskeletal:  ?    Cervical back: No rigidity or tenderness.  ?Neurological:  ?   Mental Status: She is alert.  ?Psychiatric:     ?   Mood and Affect: Mood normal.  ? ? ?  07/23/2021  ? 11:00 AM  ?Results of the Epworth flowsheet  ?Sitting and r

## 2021-07-26 ENCOUNTER — Emergency Department (HOSPITAL_COMMUNITY): Payer: Medicare PPO

## 2021-07-26 ENCOUNTER — Emergency Department (HOSPITAL_COMMUNITY)
Admission: EM | Admit: 2021-07-26 | Discharge: 2021-07-26 | Disposition: A | Discharge status: Home / Self Care | Payer: Medicare PPO | Attending: Emergency Medicine | Admitting: Emergency Medicine

## 2021-07-26 ENCOUNTER — Other Ambulatory Visit: Payer: Self-pay

## 2021-07-26 DIAGNOSIS — D649 Anemia, unspecified: Secondary | ICD-10-CM | POA: Insufficient documentation

## 2021-07-26 DIAGNOSIS — Z96659 Presence of unspecified artificial knee joint: Secondary | ICD-10-CM | POA: Diagnosis not present

## 2021-07-26 DIAGNOSIS — I10 Essential (primary) hypertension: Secondary | ICD-10-CM | POA: Diagnosis not present

## 2021-07-26 DIAGNOSIS — R0789 Other chest pain: Secondary | ICD-10-CM | POA: Insufficient documentation

## 2021-07-26 DIAGNOSIS — Z79899 Other long term (current) drug therapy: Secondary | ICD-10-CM | POA: Diagnosis not present

## 2021-07-26 DIAGNOSIS — I480 Paroxysmal atrial fibrillation: Secondary | ICD-10-CM | POA: Diagnosis not present

## 2021-07-26 DIAGNOSIS — R0902 Hypoxemia: Secondary | ICD-10-CM | POA: Diagnosis not present

## 2021-07-26 DIAGNOSIS — Z7901 Long term (current) use of anticoagulants: Secondary | ICD-10-CM | POA: Diagnosis not present

## 2021-07-26 DIAGNOSIS — R079 Chest pain, unspecified: Secondary | ICD-10-CM | POA: Diagnosis not present

## 2021-07-26 DIAGNOSIS — R7989 Other specified abnormal findings of blood chemistry: Secondary | ICD-10-CM | POA: Diagnosis not present

## 2021-07-26 LAB — BASIC METABOLIC PANEL
Anion gap: 7 (ref 5–15)
BUN: 17 mg/dL (ref 8–23)
CO2: 26 mmol/L (ref 22–32)
Calcium: 9.5 mg/dL (ref 8.9–10.3)
Chloride: 106 mmol/L (ref 98–111)
Creatinine, Ser: 0.78 mg/dL (ref 0.44–1.00)
GFR, Estimated: >60 mL/min (ref >60)
Glucose, Bld: 109 mg/dL — ABNORMAL HIGH (ref 70–99)
Potassium: 3.9 mmol/L (ref 3.5–5.1)
Sodium: 139 mmol/L (ref 135–145)

## 2021-07-26 LAB — TROPONIN I (HIGH SENSITIVITY)
Troponin I (High Sensitivity): 7 ng/L (ref <18)
Troponin I (High Sensitivity): 8 ng/L (ref <18)

## 2021-07-26 LAB — CBC
HCT: 36.2 % (ref 36.0–46.0)
Hemoglobin: 11.5 g/dL — ABNORMAL LOW (ref 12.0–15.0)
MCH: 31.5 pg (ref 26.0–34.0)
MCHC: 31.8 g/dL (ref 30.0–36.0)
MCV: 99.2 fL (ref 80.0–100.0)
Platelets: 192 10*3/uL (ref 150–400)
RBC: 3.65 MIL/uL — ABNORMAL LOW (ref 3.87–5.11)
RDW: 13.3 % (ref 11.5–15.5)
WBC: 8.9 10*3/uL (ref 4.0–10.5)
nRBC: 0 % (ref 0.0–0.2)

## 2021-07-26 LAB — D-DIMER, QUANTITATIVE: D-Dimer, Quant: 1.05 ug/mL-FEU — ABNORMAL HIGH (ref 0.00–0.50)

## 2021-07-26 MED ORDER — ISOSORBIDE MONONITRATE ER 30 MG PO TB24
15.0000 mg | ORAL_TABLET | Freq: Every day | ORAL | Status: DC
  Administered 2021-07-26: 15 mg via ORAL
  Filled 2021-07-26: qty 1

## 2021-07-26 MED ORDER — ISOSORBIDE MONONITRATE ER 30 MG PO TB24: 15.0000 mg | ORAL_TABLET | Freq: Every day | ORAL | 0 refills | Status: DC

## 2021-07-26 MED ORDER — IOHEXOL 350 MG/ML SOLN
100.0000 mL | Freq: Once | INTRAVENOUS | Status: AC | PRN
  Administered 2021-07-26: 63 mL via INTRAVENOUS

## 2021-07-26 NOTE — ED Provider Notes (Signed)
Signout received on this 81 year old female who presented for evaluation of chest pain.  Her chest pain resolved after taking nitroglycerin and aspirin.  She is currently chest pain-free.  She does have an elevated heart score.  At the time of signout she is awaiting D-dimer. ?Physical Exam  ?BP 118/68   Pulse 64   Temp 99.1 ?F (37.3 ?C) (Oral)   Resp 14   SpO2 96%  ? ? ? ?Procedures  ?Procedures ? ?ED Course / MDM  ?  ?Medical Decision Making ?Amount and/or Complexity of Data Reviewed ?Labs: ordered. ?Radiology: ordered. ? ?Risk ?Prescription drug management. ? ? ?D-dimer is elevated at 1.05.  Will add on CTA chest.  CTA chest negative for PE.  Case discussed with Dr. Terri Skains who recommends adding on Imdur and have the patient follow-up in their clinic.  Patient does have elevated heart score however no prior history of CAD or MI.  Patient remained chest pain-free throughout the emergency room stay.  Plan discussed with patient, husband, and daughter who are all at bedside.  They voiced understanding and are in agreement with plan.  First dose of Imdur given in the emergency room.  Additional refills sent to their pharmacy of choice. ? ? ? ? ?  ?Evlyn Courier, PA-C ?07/26/21 2023 ? ?  ?Blanchie Dessert, MD ?07/27/21 0013 ? ?

## 2021-07-26 NOTE — ED Provider Notes (Signed)
?[facility] ?Provider Note ? ? ?CSN: [id number] ?Arrival date & time: 07/26/21  2683 ? ?  ? ?History ? ?Chief Complaint  ?Patient presents with  ? Chest Pain  ? ? ?Cynthia [name] is a 81 y.o. female with past medical history significant for pretension, hyperlipidemia, acid reflux, paroxysmal A-fib who presents with complaint of chest pain that began around 740 this morning.  Patient reports that it felt like a squeezing around her heart.  Patient reports that she took nitroglycerin x2, as well as 2 to 481 mg baby aspirin's.  Patient reports that her chest pain fully resolved after a few moments.  At the time she denied any exertional component of her chest pain, shortness of breath, diaphoresis, nausea, vomiting.  Patient reports she had any replacement 5 weeks ago.  No previous history of blood clots.  She does take Eliquis 5 mg daily. ? ? ?Chest Pain ? ?  ? ?Home Medications ?Prior to Admission medications   ?Medication Sig Start Date End Date Taking? Authorizing Provider  ?acetaminophen (TYLENOL) 500 MG tablet Take 1,000 mg by mouth at bedtime.    [provider]  ?Alpha-Lipoic Acid (LIPOIC ACID PO) Take 600 mg by mouth daily.    [provider]  ?amiodarone (PACERONE) 200 MG tablet Take 1 tablet (200 mg total) by mouth daily. 06/25/21   Cantwell, Celeste C, PA-C  ?apixaban (ELIQUIS) 5 MG TABS tablet Take 1 tablet (5 mg total) by mouth 2 (two) times daily. With breakfast and dinner 05/30/20   Cantwell, Celeste C, PA-C  ?atorvastatin (LIPITOR) 10 MG tablet TAKE 1 TABLET BY MOUTH EVERY DAY 03/10/21   Cantwell, Celeste C, PA-C  ?AZO-CRANBERRY PO Take 1 tablet by mouth daily.    [provider]  ?Calcium Carbonate-Vitamin D3 600-400 MG-UNIT TABS Take 1 tablet by mouth daily.    [provider]  ?Cholecalciferol 25 MCG (1000 UT) tablet Take 1 tablet by mouth daily.    [provider]  ?cycloSPORINE (RESTASIS) 0.05 % ophthalmic emulsion  Place 1 drop into both eyes daily.    [provider]  ?denosumab (PROLIA) 60 MG/ML SOSY injection Inject 60 mg into the skin every 6 (six) months.    [provider]  ?desmopressin (DDAVP) 0.2 MG tablet Take 3 tablets by mouth as needed.    [provider]  ?diltiazem (CARDIZEM CD) 120 MG 24 hr capsule Take 1 capsule (120 mg total) by mouth daily. 07/02/21   Cantwell, Celeste C, PA-C  ?fluticasone (FLONASE) 50 MCG/ACT nasal spray Place 1 spray into the nose at bedtime.    [provider]  ?hydrochlorothiazide (HYDRODIURIL) 12.5 MG tablet Take 1 tablet (12.5 mg total) by mouth in the morning. 02/05/21   Cantwell, Celeste C, PA-C  ?HYDROcodone-Acetaminophen 5-300 MG TABS Take 1 tablet by mouth as needed. 06/20/21   [provider]  ?losartan (COZAAR) 100 MG tablet Take 100 mg by mouth every evening. 04/15/20   [provider]  ?meclizine (ANTIVERT) 25 MG tablet Take 25 mg by mouth daily as needed for dizziness.    [provider]  ?Melatonin 5 MG CAPS Take 5 mg by mouth at bedtime.     [provider]  ?methocarbamol (ROBAXIN) 500 MG tablet Take 1 tablet (500 mg total) by mouth every 6 (six) hours as needed for muscle spasms. 06/08/21   Drue Novel, PA  ?metoCLOPramide (REGLAN) 5 MG tablet Take 1 tablet (5 mg total) by mouth every  8 (eight) hours as needed for nausea (if ondansetron (ZOFRAN) ineffective.). 06/08/21   Elizabeth Sauer R, PA  ?metoprolol succinate (TOPROL-XL) 50 MG 24 hr tablet Take 1 tablet (50 mg total) by mouth daily. 06/10/21   Adrian Prows, MD  ?Multiple Vitamin (MULTIVITAMIN WITH MINERALS) TABS tablet Take 1-2 tablets by mouth daily. flintstone vitamins Chewable with Iron    [provider]  ?multivitamin-iron-minerals-folic acid (CENTRUM) chewable tablet Chew 1 tablet by mouth daily.    [provider]  ?nitroGLYCERIN (NITROLINGUAL) 0.4 MG/SPRAY spray Place 1 spray under the tongue every 5 (five) minutes x 3 doses  as needed for chest pain. 03/19/21   Cantwell, Gerline Legacy, PA-C  ?Omega-3 1000 MG CAPS Take 2,000 mg by mouth daily.     [provider]  ?ondansetron (ZOFRAN) 4 MG tablet Take 4 mg by mouth every 8 (eight) hours as needed for nausea or vomiting.    [provider]  ?pantoprazole (PROTONIX) 40 MG tablet Take 40 mg by mouth daily before breakfast.  06/12/14   [provider]  ?sertraline (ZOLOFT) 25 MG tablet Take 25 mg by mouth daily.    [provider]  ?thyroid (ARMOUR) 60 MG tablet Take 60 mg by mouth daily before breakfast. 11/07/17   [provider]  ?traMADol (ULTRAM) 50 MG tablet Take 1 tablet (50 mg total) by mouth every 6 (six) hours as needed for severe pain. 06/08/21   Drue Novel, PA  ?vitamin C (ASCORBIC ACID) 500 MG tablet Take 500 mg by mouth daily.     [provider]  ?   ? ?Allergies    ?Flecainide, Oxycodone, Trimethoprim, Alendronate sodium, Avelox [moxifloxacin hcl in nacl], Ciprofloxacin, and Other   ? ?Review of Systems   ?Review of Systems  ?Respiratory:  Positive for chest tightness.   ?Cardiovascular:  Positive for chest pain.  ?All other systems reviewed and are negative. ? ?Physical Exam ?Updated Vital Signs ?BP 118/68   Pulse 64   Temp 99.1 ?F (37.3 ?C) (Oral)   Resp 14   SpO2 96%  ?Physical Exam ?Vitals and nursing note reviewed.  ?Constitutional:   ?   General: She is not in acute distress. ?   Appearance: Normal appearance.  ?HENT:  ?   Head: Normocephalic and atraumatic.  ?Eyes:  ?   General:     ?   Right eye: No discharge.     ?   Left eye: No discharge.  ?Cardiovascular:  ?   Rate and Rhythm: Normal rate and regular rhythm.  ?   Heart sounds: No murmur heard. ?  No friction rub. No gallop.  ?Pulmonary:  ?   Effort: Pulmonary effort is normal.  ?   Breath sounds: Normal breath sounds.  ?Chest:  ?   Comments: No tenderness to palpation of the chest ?Abdominal:  ?   General: Bowel sounds are normal.  ?   Palpations: Abdomen is  soft.  ?Musculoskeletal:  ?   Comments: No bilateral lower extremity edema, no tenderness palpation of the calf, negative Homans' sign  ?Skin: ?   General: Skin is warm and dry.  ?   Capillary Refill: Capillary refill takes less than 2 seconds.  ?Neurological:  ?   Mental Status: She is alert and oriented to person, place, and time.  ?Psychiatric:     ?   Mood and Affect: Mood normal.     ?   Behavior: Behavior normal.  ? ? ?ED Results / Procedures /  Treatments   ?Labs ?(all labs ordered are listed, but only abnormal results are displayed) ?Labs Reviewed  ?BASIC METABOLIC PANEL - Abnormal; Notable for the following components:  ?    Result Value  ? Glucose, Bld 109 (*)   ? All other components within normal limits  ?CBC - Abnormal; Notable for the following components:  ? RBC 3.65 (*)   ? Hemoglobin 11.5 (*)   ? All other components within normal limits  ?D-DIMER, QUANTITATIVE - Abnormal; Notable for the following components:  ? D-Dimer, Quant 1.05 (*)   ? All other components within normal limits  ?TROPONIN I (HIGH SENSITIVITY)  ?TROPONIN I (HIGH SENSITIVITY)  ? ? ?EKG ?EKG Interpretation ? ?Date/Time:  Saturday July 26 2021 10:06:35 EDT ?Ventricular Rate:  66 ?PR Interval:  146 ?QRS Duration: 94 ?QT Interval:  446 ?QTC Calculation: 467 ?R Axis:   -58 ?Text Interpretation: Normal sinus rhythm Left anterior fascicular block Cannot rule out Anterior infarct , age undetermined Abnormal ECG Since last tracing rate slower Otherwise no significant change Confirmed by Deno Etienne [phone]) on 07/26/2021 12:46:19 PM ? ?Radiology ?DG Chest 2 View ? ?Result Date: 07/26/2021 ?CLINICAL DATA:  Chest pain EXAM: CHEST - 2 VIEW COMPARISON:  04/10/2020 FINDINGS: Loop recorder is identified in the projection of the left heart. Heart size and mediastinal contours are stable. Lungs are hyperinflated with flattening of the hemidiaphragms as before. No pleural effusion or interstitial edema. No airspace opacities. Spondylosis noted within  the thoracic spine. IMPRESSION: No acute cardiopulmonary abnormalities. Electronically Signed   By: Kerby Moors M.D.   On: 07/26/2021 10:54   ? ?Procedures ?Procedures  ? ? ?Medications Ordered in ED ?Medi

## 2021-07-26 NOTE — Discharge Instructions (Addendum)
Your work-up today was reassuring.  He also had a CT scan done today which did not show any evidence of blood clot in your lungs.  you remained without chest pain during your emergency room stay.  I discussed your case with Dr. Terri Skains who is a cardiologist covering for Dr. Einar Gip.  He recommends starting you on a medication called Imdur and have any follow-up with Dr. Einar Gip later this week.  His office will reach out to you by Wednesday.  If you do not hear anything by Wednesday please give his office a call to schedule an appointment.  If you have any worsening chest pain, shortness of breath please return to the emergency room for evaluation. ?

## 2021-07-26 NOTE — ED Triage Notes (Signed)
Pt to triage via [facility] from home.  Reports squeezing pain to center of chest since 8:30am that is worse with movement.  Took 4 baby ASA and 2 sprays of NTG and pain resolved.  Denies pain at present.  Denies SOB and nausea.     ?

## 2021-07-26 NOTE — ED Provider Triage Note (Signed)
Emergency Medicine Provider Triage Evaluation Note ? ?Cynthia [name] , a 81 y.o. female  was evaluated in triage.  Pt complains of chest tightness this morning.  Had 4 aspirin and nitro which helped her pain.  Says she feels fine now.  Denies shortness of breath or palpitations.  Has a history of atrial fibrillation ? ?Review of Systems  ?Positive: Chest tightness ?Negative: Shortness of breath, palpitations ? ?Physical Exam  ?BP 124/75 (BP Location: Right Arm)   Pulse 64   Temp 99.1 ?F (37.3 ?C) (Oral)   Resp 16   SpO2 96%  ?Gen:   Awake, no distress   ?Resp:  Normal effort  ?MSK:   Moves extremities without difficulty  ?Other:  Regular rate and rhythm ? ?Medical Decision Making  ?Medically screening exam initiated at 10:35 AM.  Appropriate orders placed.  Cynthia [name] was informed that the remainder of the evaluation will be completed by another provider, this initial triage assessment does not replace that evaluation, and the importance of remaining in the ED until their evaluation is complete. ? ?  ?Rhae Hammock, PA-C ?07/26/21 1036 ? ?

## 2021-07-29 ENCOUNTER — Encounter: Payer: Self-pay | Admitting: Student

## 2021-07-29 ENCOUNTER — Ambulatory Visit: Payer: Medicare PPO | Admitting: Student

## 2021-07-29 VITALS — BP 99/53 | HR 59 | Temp 97.0°F | Resp 17 | Ht 65.0 in | Wt 142.0 lb

## 2021-07-29 DIAGNOSIS — I480 Paroxysmal atrial fibrillation: Secondary | ICD-10-CM | POA: Diagnosis not present

## 2021-07-29 DIAGNOSIS — M25661 Stiffness of right knee, not elsewhere classified: Secondary | ICD-10-CM | POA: Diagnosis not present

## 2021-07-29 DIAGNOSIS — I10 Essential (primary) hypertension: Secondary | ICD-10-CM

## 2021-07-29 DIAGNOSIS — M25561 Pain in right knee: Secondary | ICD-10-CM | POA: Diagnosis not present

## 2021-07-29 DIAGNOSIS — I951 Orthostatic hypotension: Secondary | ICD-10-CM | POA: Diagnosis not present

## 2021-07-29 NOTE — Progress Notes (Signed)
? ?Primary Physician/Referring:  Deland Pretty, MD ? ?[name], female    DOB: 1940-12-24, 81 y.o.   MRN: [id number] ? ?No chief complaint on file. ? ?HPI:   ? ?Cynthia [name]  is a 81 y.o. Caucasian female with hypertension, hyperlipidemia, frequent UTI,  paroxysmal atrial fibrillation, atrial tachycardia, obstructive sleep apnea on CPAP. Tolerating anticoagulation well. Flecainide discontinued due to dizziness. In the past she has had recurrent episodes of near syncope and severe orthostatic changes with recurrent UTI.  However patient with episodes of syncope and near syncope not associated with UTI.  Patient underwent surgery to her right foot 03/04/2020 and had an episode of syncope while at rehabilitation facility on 04/10/2020.  ? ?Recorder transmission 06/2021 revealed persistent atrial fibrillation with RVR, patient was therefore started on amiodarone which was reduced to 200 mg once daily at last office visit.  Unfortunately TSH and CMP have not been done as previously ordered. ? ?Patient presents for urgent visit as she was evaluated 07/26/2021 in the ED for chest pain.  Patient states that on the morning of 07/26/2021 she woke up with central chest discomfort, patient's husband called EMS and she was advised over the phone to take nitroglycerin and aspirin.  Patient states that by the time EMS arrived chest pain had resolved. ? ?Patient was transported to the ED for further evaluation.  Evaluation in the emergency department revealed Nonischemic EKG, negative serial troponins, unremarkable chest x-ray, and CTA negative for PE.  Dr. Maylene Roes was consulted by ED provider and advised that patient start isosorbide mononitrate 15 mg p.o. daily.  Patient reports she has had no recurrence of chest pain since that episode, however she has had intermittent dizziness and fatigue over the last 3 days.  On presentation to the office today patient's blood pressure is quite soft. ? ?Past Medical History:   ?Diagnosis Date  ? Acute GI bleeding 10/27/2017  ? Arthritis   ? "joints" (11/10/2017)  ? Dizziness   ? Dysrhythmia   ? afib  ? Encounter for loop recorder check 06/09/2020  ? GERD (gastroesophageal reflux disease)   ? GI bleed 11/06/2017  ? Hearing loss   ? History of blood transfusion 10/2017  ? Hyperlipidemia   ? Hypertension   ? Hyperthyroidism   ? Loop recorder [employer] Loop Recorder 05/23/2020 05/23/2020  ? Melanoma ([place])   ? "cut off my back"  ? Memory loss   ? Pneumonia   ? Sleep apnea   ? cpap  ? Syncope and collapse   ? ?Past Surgical History:  ?Procedure Laterality Date  ? ABDOMINAL HYSTERECTOMY    ? "partial"  ? APPENDECTOMY    ? BIOPSY  11/17/2017  ? Procedure: BIOPSY;  Surgeon: Clarene Essex, MD;  Location: [facility];  Service: Endoscopy;;  ? COLONOSCOPY WITH PROPOFOL N/A 11/17/2017  ? Procedure: COLONOSCOPY WITH PROPOFOL;  Surgeon: Clarene Essex, MD;  Location: [facility];  Service: Endoscopy;  Laterality: N/A;  ? CRANIECTOMY FOR EXCISION OF ACOUSTIC NEUROMA    ? ESOPHAGOGASTRODUODENOSCOPY (EGD) WITH PROPOFOL N/A 10/28/2017  ? Procedure: ESOPHAGOGASTRODUODENOSCOPY (EGD) WITH PROPOFOL;  Surgeon: Clarene Essex, MD;  Location: [facility];  Service: Endoscopy;  Laterality: N/A;  ? FOOT SURGERY Bilateral 04/18/2020  ? KNEE ARTHROPLASTY Right 06/05/2021  ? Procedure: COMPUTER ASSISTED TOTAL KNEE ARTHROPLASTY;  Surgeon: Rod Can, MD;  Location: [facility];  Service: Orthopedics;  Laterality: Right;  ? KNEE ARTHROSCOPY Left   ? LEFT HEART CATH AND CORONARY ANGIOGRAPHY N/A 11/24/2016  ?  Procedure: LEFT HEART CATH AND CORONARY ANGIOGRAPHY;  Surgeon: Jettie Booze, MD;  Location: [facility];  Service: Cardiovascular;  Laterality: N/A;  ? MELANOMA EXCISION    ? "off my back"  ? NASAL SINUS SURGERY    ? TONSILLECTOMY    ? ?Family History  ?Problem Relation Age of Onset  ? Congestive Heart Failure Mother   ? Heart disease Father   ?     History of heart attacks at a later age.    ? Heart disease  Brother   ? Hearing loss Brother   ?  ?Social History  ? ?Tobacco Use  ? Smoking status: Former  ?  Years: 2.00  ?  Types: Cigarettes  ?  Quit date: 51  ?  Years since quitting: 43.3  ? Smokeless tobacco: Never  ? Tobacco comments:  ?  11/10/2017 "only smoked when I was on my period"  ?Substance Use Topics  ? Alcohol use: Yes  ?  Comment: wine on occas  ?Marital Status: Married  ? ?ROS  ?Review of Systems  ?Constitutional: Positive for malaise/fatigue. Negative for weight gain.  ?Cardiovascular:  Negative for chest pain (no recurrence), claudication, dyspnea on exertion, leg swelling, near-syncope, orthopnea, palpitations, paroxysmal nocturnal dyspnea and syncope.  ?Respiratory:  Negative for shortness of breath.   ?Neurological:  Positive for dizziness.  ? ?Objective  ?Blood pressure (!) 99/53, pulse (!) 59, temperature (!) 97 ?F (36.1 ?C), temperature source Temporal, resp. rate 17, height '5\' 5"'  (1.651 m), weight 142 lb (64.4 kg), SpO2 98 %.  ? ?  07/29/2021  ?  1:10 PM 07/29/2021  ? 12:57 PM 07/26/2021  ?  8:55 PM  ?Vitals with BMI  ?Height  '5\' 5"'    ?Weight  142 lbs   ?BMI  23.63   ?Systolic 99 616 073  ?Diastolic 53 53 68  ?Pulse 59 60 65  ? ? Physical Exam ?Vitals reviewed.  ?Constitutional:   ?   General: She is not in acute distress. ?   Comments: Patient using walker  ?Cardiovascular:  ?   Rate and Rhythm: Normal rate and regular rhythm.  ?   Pulses: Intact distal pulses.  ?   Heart sounds: Normal heart sounds, S1 normal and S2 normal. No murmur heard. ?  No gallop.  ?   Comments: No JVD. ?Pulmonary:  ?   Effort: Pulmonary effort is normal. No respiratory distress.  ?   Breath sounds: Normal breath sounds. No wheezing, rhonchi or rales.  ?Musculoskeletal:  ?   Right lower leg: No edema.  ?   Left lower leg: No edema.  ?Neurological:  ?   Mental Status: She is alert.  ?Physical exam unchanged compared to previous office visit. ? ?Laboratory examination:  ? ?Recent Labs  ?  06/02/21 ?0950 06/06/21 ?0323  07/26/21 ?1017  ?NA 134* 137 139  ?K 3.8 4.0 3.9  ?CL 100 108 106  ?CO2 '28 23 26  ' ?GLUCOSE 90 156* 109*  ?BUN 25* 24* 17  ?CREATININE 0.75 0.69 0.78  ?CALCIUM 9.1 8.6* 9.5  ?GFRNONAA >60 >60 >60  ? ?estimated creatinine clearance is 51.3 mL/min (by C-G formula based on SCr of 0.78 mg/dL).  ? ?  Latest Ref Rng & Units 07/26/2021  ? 10:17 AM 06/06/2021  ?  3:23 AM 06/02/2021  ?  9:50 AM  ?CMP  ?Glucose 70 - 99 mg/dL 109   156   90    ?BUN 8 - 23 mg/dL 17  24   25    ?Creatinine 0.44 - 1.00 mg/dL 0.78   0.69   0.75    ?Sodium 135 - 145 mmol/L 139   137   134    ?Potassium 3.5 - 5.1 mmol/L 3.9   4.0   3.8    ?Chloride 98 - 111 mmol/L 106   108   100    ?CO2 22 - 32 mmol/L '26   23   28    ' ?Calcium 8.9 - 10.3 mg/dL 9.5   8.6   9.1    ? ? ?  Latest Ref Rng & Units 07/26/2021  ? 10:17 AM 06/08/2021  ?  3:43 AM 06/07/2021  ?  3:14 AM  ?CBC  ?WBC 4.0 - 10.5 K/uL 8.9   12.4   14.3    ?Hemoglobin 12.0 - 15.0 g/dL 11.5   8.9   8.7    ?Hematocrit 36.0 - 46.0 % 36.2   26.5   26.3    ?Platelets 150 - 400 K/uL 192   141   136    ? ?Lipid Panel  ?   ?Component Value Date/Time  ? CHOL 165 11/24/2016 0040  ? TRIG 88 11/24/2016 0040  ? HDL 61 11/24/2016 0040  ? CHOLHDL 2.7 11/24/2016 0040  ? VLDL 18 11/24/2016 0040  ? [place] 86 11/24/2016 0040  ? ?HEMOGLOBIN A1C ?Lab Results  ?Component Value Date  ? HGBA1C 5.6 11/24/2016  ? MPG 114 11/24/2016  ? ?TSH ?No results for input(s): TSH in the last 8760 hours. ? ? ?External labs:  ?06/13/2020: ?Glucose 89, sodium 141, potassium 3.8, BUN 34, creatinine 0.8, GFR >60, AST 16, ALT 23, alk phos 40 ? ?03/11/2020: ?Hemoglobin 10.9, hematocrit 34.2, platelet 241, MCV 94.7 ?Total cholesterol 165, triglycerides 56, HDL 65, LDL 89 ?TSH 0.24 ? ?Cholesterol, total 145.000 m 09/01/2019 ?HDL 67.000 mg 10/13/2019 ?LDL-C 94.000 cal 10/13/2019 ?Triglycerides 80.000 mg 10/13/2019 ? ?Hemoglobin 10.900 g/d 01/24/2020 ?INR 1.160 11/06/2017 ?Platelets 197.000 K/ 01/24/2020 ? ?Creatinine, Serum 0.580 mg/ 01/24/2020 ?Potassium  3.500 mm 01/24/2020 ?ALT (SGPT) 15.000 IU/ 09/01/2019 ? ?TSH 0.870 10/13/2019 ? ?Hemoglobin 9.300 12/14/2018;  INR 1.160 11/06/2017 ?Platelets 188.000 12/14/2018 ? ?Creatinine, Serum 0.970 12/14/2018 ?Potassium 4.000 12/14/2018 ?Mag

## 2021-07-29 NOTE — Patient Instructions (Signed)
Today (07/29/21) if you have not already taken any of the following, then do not take them today:  ?Diltiazem, isosorbide mononitrate, losartan, metoprolol succinate  ? ?Tomorrow (07/30/2021) check you blood pressure before taking your morning medications. If blood pressure is <886 mmHg systolic (top number) then call the office for further recommendations. If blood pressure is >484 mmHg systolic you may take morning medications. Repeat this prior to afternoon/evening medications as well.  ? ?Discontinue hydrochlorothiazide all together, do not take anymore.  ?

## 2021-08-02 ENCOUNTER — Encounter: Payer: Self-pay | Admitting: Student

## 2021-08-04 ENCOUNTER — Telehealth: Payer: Self-pay

## 2021-08-04 ENCOUNTER — Ambulatory Visit: Payer: Medicare PPO | Admitting: Student

## 2021-08-04 ENCOUNTER — Telehealth: Payer: Self-pay | Admitting: Student

## 2021-08-04 VITALS — BP 106/64 | HR 61

## 2021-08-04 DIAGNOSIS — I10 Essential (primary) hypertension: Secondary | ICD-10-CM | POA: Diagnosis not present

## 2021-08-04 DIAGNOSIS — I951 Orthostatic hypotension: Secondary | ICD-10-CM

## 2021-08-04 NOTE — Telephone Encounter (Signed)
[place] called to give you an update before patient comes in for nurse visit BP check. ? ?Initial :104/60 RA ?138/72 standing RA ?130/68 sitting RA ? ? Patient was asymtomatic ? ?Pulse 64 ?Oxygen 96% ? ?Patient did take her morning BP before the visit. ?

## 2021-08-04 NOTE — Progress Notes (Signed)
Medication reconciliation completed.  ?Patient is no longer taking HCTZ ?Reviewed home monitoring log as well as reported blood pressures.  On average patient's blood pressure is well controlled with home health monitor and our office monitoring.  Suspect patient's personal blood pressure monitoring is reading high. ?She is asymptomatic. ?Advised her to continue present medications.  Advised the patient defer blood pressure monitoring to home health and only check her blood pressure independently when she is having symptoms. ?

## 2021-08-04 NOTE — Telephone Encounter (Signed)
ON-CALL CARDIOLOGY ?08/02/2021 ? ?Patient's name: Cynthia [name].   ?MRN: [id number].    ?DOB: [birth year] ?Primary care provider: Deland Pretty, MD. ? ?Interaction regarding this patient's care today: ?Patient was seen 07/29/2021 at which time given hypotension discontinued hydrochlorothiazide.  Patient then called the office today with systolic blood pressure readings at home >160 mmHg for the last 3 days.  Patient was advised to increase Imdur from '15mg'$  to 30 mg p.o. daily. ? ?Impression: ?  ICD-10-CM   ?1. Essential hypertension, benign  I10   ?  ? ? ?No orders of the defined types were placed in this encounter. ? ? ?No orders of the defined types were placed in this encounter. ? ? ?Telephone encounter total time: 5 minutes  ? ? ? ?Alethia Berthold, PA-C ?08/04/2021, 12:51 PM ?Office: [phone] ? ?

## 2021-08-05 ENCOUNTER — Ambulatory Visit: Payer: Medicare PPO | Admitting: Student

## 2021-08-06 DIAGNOSIS — M25561 Pain in right knee: Secondary | ICD-10-CM | POA: Diagnosis not present

## 2021-08-06 DIAGNOSIS — M25661 Stiffness of right knee, not elsewhere classified: Secondary | ICD-10-CM | POA: Diagnosis not present

## 2021-08-08 DIAGNOSIS — J309 Allergic rhinitis, unspecified: Secondary | ICD-10-CM | POA: Diagnosis not present

## 2021-08-08 DIAGNOSIS — M25561 Pain in right knee: Secondary | ICD-10-CM | POA: Diagnosis not present

## 2021-08-08 DIAGNOSIS — M25661 Stiffness of right knee, not elsewhere classified: Secondary | ICD-10-CM | POA: Diagnosis not present

## 2021-08-11 DIAGNOSIS — M25661 Stiffness of right knee, not elsewhere classified: Secondary | ICD-10-CM | POA: Diagnosis not present

## 2021-08-11 DIAGNOSIS — M25561 Pain in right knee: Secondary | ICD-10-CM | POA: Diagnosis not present

## 2021-08-13 DIAGNOSIS — M25661 Stiffness of right knee, not elsewhere classified: Secondary | ICD-10-CM | POA: Diagnosis not present

## 2021-08-13 DIAGNOSIS — M25561 Pain in right knee: Secondary | ICD-10-CM | POA: Diagnosis not present

## 2021-08-15 DIAGNOSIS — R55 Syncope and collapse: Secondary | ICD-10-CM | POA: Diagnosis not present

## 2021-08-15 DIAGNOSIS — Z95818 Presence of other cardiac implants and grafts: Secondary | ICD-10-CM | POA: Diagnosis not present

## 2021-08-15 DIAGNOSIS — Z4509 Encounter for adjustment and management of other cardiac device: Secondary | ICD-10-CM | POA: Diagnosis not present

## 2021-08-19 ENCOUNTER — Other Ambulatory Visit: Payer: Self-pay

## 2021-08-19 MED ORDER — ISOSORBIDE MONONITRATE ER 30 MG PO TB24: 15.0000 mg | ORAL_TABLET | Freq: Every day | ORAL | 3 refills | Status: DC

## 2021-08-19 MED ORDER — ISOSORBIDE MONONITRATE ER 30 MG PO TB24: 15.0000 mg | ORAL_TABLET | Freq: Every day | ORAL | 0 refills | Status: DC

## 2021-08-20 ENCOUNTER — Telehealth: Payer: Self-pay | Admitting: Student

## 2021-08-20 DIAGNOSIS — Z87440 Personal history of urinary (tract) infections: Secondary | ICD-10-CM | POA: Diagnosis not present

## 2021-08-20 NOTE — Telephone Encounter (Signed)
Patient's husband states he thought patient was supposed to be taking 1 full tablet of isosorbide instead of a half tablet. Patient's husband spoke with Luetta Nutting, who has already contacted the pharmacy on this matter. Patient's husband states now medication is unavailable until 09/25/21. Please call the patient or her husband back to determine how to proceed with this. ?

## 2021-08-20 NOTE — Telephone Encounter (Signed)
Called and spoke to pharmacy. Pt aware that she should  only be taking half a tablet of the isosorbide.

## 2021-08-22 DIAGNOSIS — M25661 Stiffness of right knee, not elsewhere classified: Secondary | ICD-10-CM | POA: Diagnosis not present

## 2021-08-22 DIAGNOSIS — M25561 Pain in right knee: Secondary | ICD-10-CM | POA: Diagnosis not present

## 2021-08-25 DIAGNOSIS — M25561 Pain in right knee: Secondary | ICD-10-CM | POA: Diagnosis not present

## 2021-08-25 DIAGNOSIS — M25661 Stiffness of right knee, not elsewhere classified: Secondary | ICD-10-CM | POA: Diagnosis not present

## 2021-08-27 ENCOUNTER — Other Ambulatory Visit: Payer: Self-pay | Admitting: Pulmonary Disease

## 2021-08-27 DIAGNOSIS — G4733 Obstructive sleep apnea (adult) (pediatric): Secondary | ICD-10-CM

## 2021-08-27 NOTE — Progress Notes (Signed)
Order placed for home sleep study

## 2021-08-28 ENCOUNTER — Ambulatory Visit: Payer: Medicare PPO

## 2021-08-28 DIAGNOSIS — G4733 Obstructive sleep apnea (adult) (pediatric): Secondary | ICD-10-CM | POA: Diagnosis not present

## 2021-08-28 NOTE — Progress Notes (Signed)
Primary Physician/Referring:  Deland Pretty, MD  [name], female    DOB: 04-Aug-1940, 81 y.o.   MRN: [id number]  Chief Complaint  Patient presents with   Hypertension   Follow-up   Paroxysmal Atrial Fibrillation   HPI:    Cynthia [name]  is a 81 y.o. Caucasian female with hypertension, hyperlipidemia, frequent UTI,  paroxysmal atrial fibrillation, atrial tachycardia, obstructive sleep apnea on CPAP. Tolerating anticoagulation well. Flecainide discontinued due to dizziness. In the past she has had recurrent episodes of near syncope and severe orthostatic changes with recurrent UTI.  However patient with episodes of syncope and near syncope not associated with UTI.  Patient underwent surgery to her right foot 03/04/2020 and had an episode of syncope while at rehabilitation facility on 04/10/2020.   Recorder transmission 06/2021 revealed persistent atrial fibrillation with RVR, patient was therefore started on amiodarone.   Following last office visit patient came back with all of her medication bottles to do a complete medication reconciliation and blood pressure recheck.  Patient's medication list was updated and she was advised to discontinue hydrochlorothiazide altogether.  Last blood pressure check patient brought her home monitor which was reading high compared to office readings.  Patient now presents for 6-week follow-up of blood pressure management.  Blood pressure is well controlled and patient is feeling well overall from a cardiovascular standpoint.  Denies chest pain, significant dyspnea.  Patient is currently enrolled in a OCEANIC-AF trial Asundexian vs. Apixaban.  She continues to tolerate anticoagulation without bleeding diathesis.  Past Medical History:  Diagnosis Date   Acute GI bleeding 10/27/2017   Arthritis    "joints" (11/10/2017)   Dizziness    Dysrhythmia    afib   Encounter for loop recorder check 06/09/2020   GERD (gastroesophageal reflux disease)     GI bleed 11/06/2017   Hearing loss    History of blood transfusion 10/2017   Hyperlipidemia    Hypertension    Hyperthyroidism    Loop recorder [employer] Loop Recorder 05/23/2020 05/23/2020   Melanoma ([place])    "cut off my back"   Memory loss    Pneumonia    Sleep apnea    cpap   Syncope and collapse    Past Surgical History:  Procedure Laterality Date   ABDOMINAL HYSTERECTOMY     "partial"   APPENDECTOMY     BIOPSY  11/17/2017   Procedure: BIOPSY;  Surgeon: Clarene Essex, MD;  Location: [facility];  Service: Endoscopy;;   COLONOSCOPY WITH PROPOFOL N/A 11/17/2017   Procedure: COLONOSCOPY WITH PROPOFOL;  Surgeon: Clarene Essex, MD;  Location: [facility];  Service: Endoscopy;  Laterality: N/A;   CRANIECTOMY FOR EXCISION OF ACOUSTIC NEUROMA     ESOPHAGOGASTRODUODENOSCOPY (EGD) WITH PROPOFOL N/A 10/28/2017   Procedure: ESOPHAGOGASTRODUODENOSCOPY (EGD) WITH PROPOFOL;  Surgeon: Clarene Essex, MD;  Location: [facility];  Service: Endoscopy;  Laterality: N/A;   FOOT SURGERY Bilateral 04/18/2020   KNEE ARTHROPLASTY Right 06/05/2021   Procedure: COMPUTER ASSISTED TOTAL KNEE ARTHROPLASTY;  Surgeon: Rod Can, MD;  Location: [facility];  Service: Orthopedics;  Laterality: Right;   KNEE ARTHROSCOPY Left    LEFT HEART CATH AND CORONARY ANGIOGRAPHY N/A 11/24/2016   Procedure: LEFT HEART CATH AND CORONARY ANGIOGRAPHY;  Surgeon: Jettie Booze, MD;  Location: [facility];  Service: Cardiovascular;  Laterality: N/A;   MELANOMA EXCISION     "off my back"   NASAL SINUS SURGERY     TONSILLECTOMY     Family  History  Problem Relation Age of Onset   Congestive Heart Failure Mother    Heart disease Father        History of heart attacks at a later age.     Heart disease Brother    Hearing loss Brother     Social History   Tobacco Use   Smoking status: Former    Years: 2.00    Types: Cigarettes    Quit date: 1980    Years since quitting: 43.4   Smokeless tobacco: Never    Tobacco comments:    11/10/2017 "only smoked when I was on my period"  Substance Use Topics   Alcohol use: Yes    Comment: wine on occas  Marital Status: Married   ROS  Review of Systems  Constitutional: Negative for weight gain.  Cardiovascular:  Negative for chest pain (no recurrence), claudication, dyspnea on exertion, leg swelling, near-syncope, orthopnea, palpitations, paroxysmal nocturnal dyspnea and syncope.  Respiratory:  Negative for shortness of breath.   Neurological:  Negative for dizziness.   Objective  Blood pressure 133/70, pulse 61, temperature (!) 97.3 F (36.3 C), temperature source Temporal, resp. rate 16, height 5' 5" (1.651 m), weight 137 lb 12.8 oz (62.5 kg), SpO2 93 %.     09/01/2021   11:01 AM 09/01/2021   10:49 AM 08/04/2021    1:39 PM  Vitals with BMI  Height  5' 5"   Weight  137 lbs 13 oz   BMI  16.10   Systolic 960 454 098  Diastolic 70 53 64  Pulse 61 75     Physical Exam Vitals reviewed.  Constitutional:      General: She is not in acute distress.    Comments: Patient using walker  Cardiovascular:     Rate and Rhythm: Normal rate and regular rhythm.     Pulses: Intact distal pulses.     Heart sounds: Normal heart sounds, S1 normal and S2 normal. No murmur heard.   No gallop.     Comments: No JVD. Pulmonary:     Effort: Pulmonary effort is normal. No respiratory distress.     Breath sounds: Normal breath sounds. No wheezing, rhonchi or rales.  Musculoskeletal:     Right lower leg: No edema.     Left lower leg: No edema.  Neurological:     Mental Status: She is alert.  Physical exam unchanged compared to previous office visit.  Laboratory examination:   Recent Labs    06/02/21 0950 06/06/21 0323 07/26/21 1017  NA 134* 137 139  K 3.8 4.0 3.9  CL 100 108 106  CO2 _0 GLUCOSE 90 156* 109*  BUN 25* 24* 17  CREATININE 0.75 0.69 0.78  CALCIUM 9.1 8.6* 9.5  GFRNONAA >60 >60 >60   CrCl cannot be calculated (Patient's most  recent lab result is older than the maximum 21 days allowed.).     Latest Ref Rng & Units 07/26/2021   10:17 AM 06/06/2021    3:23 AM 06/02/2021    9:50 AM  CMP  Glucose 70 - 99 mg/dL 109   156   90    BUN 8 - 23 mg/dL _1 Creatinine 0.44 - 1.00 mg/dL 0.78   0.69   0.75    Sodium 135 - 145 mmol/L 139   137   134    Potassium 3.5 - 5.1 mmol/L 3.9   4.0   3.8  Chloride 98 - 111 mmol/L 106   108   100    CO2 22 - 32 mmol/L _0 Calcium 8.9 - 10.3 mg/dL 9.5   8.6   9.1        Latest Ref Rng & Units 07/26/2021   10:17 AM 06/08/2021    3:43 AM 06/07/2021    3:14 AM  CBC  WBC 4.0 - 10.5 K/uL 8.9   12.4   14.3    Hemoglobin 12.0 - 15.0 g/dL 11.5   8.9   8.7    Hematocrit 36.0 - 46.0 % 36.2   26.5   26.3    Platelets 150 - 400 K/uL 192   141   136     Lipid Panel     Component Value Date/Time   CHOL 165 11/24/2016 0040   TRIG 88 11/24/2016 0040   HDL 61 11/24/2016 0040   CHOLHDL 2.7 11/24/2016 0040   VLDL 18 11/24/2016 0040   LDLCALC 86 11/24/2016 0040   HEMOGLOBIN A1C Lab Results  Component Value Date   HGBA1C 5.6 11/24/2016   MPG 114 11/24/2016   TSH No results for input(s): TSH in the last 8760 hours.   External labs:  06/13/2020: Glucose 89, sodium 141, potassium 3.8, BUN 34, creatinine 0.8, GFR >60, AST 16, ALT 23, alk phos 40  03/11/2020: Hemoglobin 10.9, hematocrit 34.2, platelet 241, MCV 94.7 Total cholesterol 165, triglycerides 56, HDL 65, LDL 89 TSH 0.24  Cholesterol, total 145.000 m 09/01/2019 HDL 67.000 mg 10/13/2019 LDL-C 94.000 cal 10/13/2019 Triglycerides 80.000 mg 10/13/2019  Hemoglobin 10.900 g/d 01/24/2020 INR 1.160 11/06/2017 Platelets 197.000 K/ 01/24/2020  Creatinine, Serum 0.580 mg/ 01/24/2020 Potassium 3.500 mm 01/24/2020 ALT (SGPT) 15.000 IU/ 09/01/2019  TSH 0.870 10/13/2019  Hemoglobin 9.300 12/14/2018;  INR 1.160 11/06/2017 Platelets 188.000 12/14/2018  Creatinine, Serum 0.970 12/14/2018 Potassium 4.000 12/14/2018 Magnesium  N/D ALT (SGPT) 15.000 12/14/2018  Labs 11/02/2018: BUN 26, creatinine 0.9, EGFR greater than 60 mL, Hb 10.6/HCT 32.2, platelets 244.   Allergies   Allergies  Allergen Reactions   Flecainide Other (See Comments)    Severe dizziness   Oxycodone Other (See Comments)    uphoria   Trimethoprim Other (See Comments)    Other reaction(s): hives   Alendronate Sodium Nausea Only   Avelox [Moxifloxacin Hcl In Nacl] Other (See Comments)    Unknown reaction   Ciprofloxacin Other (See Comments)    Other reaction(s): multiple medication interactions   Other Other (See Comments)    Avelox+cymbalta Together Other reaction(s): flu like symptoms    Medications Prior to Visit:   Outpatient Medications Prior to Visit  Medication Sig Dispense Refill   acetaminophen (TYLENOL) 500 MG tablet Take 1,000 mg by mouth at bedtime.     Alpha-Lipoic Acid (LIPOIC ACID PO) Take 600 mg by mouth daily.     amiodarone (PACERONE) 200 MG tablet Take 1 tablet (200 mg total) by mouth daily. 90 tablet 2   atorvastatin (LIPITOR) 10 MG tablet TAKE 1 TABLET BY MOUTH EVERY DAY 90 tablet 3   AZO-CRANBERRY PO Take 1 tablet by mouth daily.     Calcium Carbonate-Vitamin D3 600-400 MG-UNIT TABS Take 1 tablet by mouth daily.     cycloSPORINE (RESTASIS) 0.05 % ophthalmic emulsion Place 1 drop into both eyes daily.     denosumab (PROLIA) 60 MG/ML SOSY injection Inject 60 mg into the skin every 6 (six) months.     desmopressin (DDAVP) 0.2  MG tablet Take 3 tablets by mouth as needed.     diltiazem (CARDIZEM CD) 120 MG 24 hr capsule Take 1 capsule (120 mg total) by mouth daily. 90 capsule 1   fluticasone (FLONASE) 50 MCG/ACT nasal spray Place 1 spray into the nose at bedtime.     isosorbide mononitrate (IMDUR) 30 MG 24 hr tablet Take 0.5 tablets (15 mg total) by mouth daily. 30 tablet 3   losartan (COZAAR) 100 MG tablet Take 100 mg by mouth every evening.     meclizine (ANTIVERT) 25 MG tablet Take 25 mg by mouth daily as needed for  dizziness.     Melatonin 5 MG CAPS Take 5 mg by mouth at bedtime.      methocarbamol (ROBAXIN) 500 MG tablet Take 1 tablet (500 mg total) by mouth every 6 (six) hours as needed for muscle spasms. 60 tablet 0   metoprolol succinate (TOPROL-XL) 50 MG 24 hr tablet Take 1 tablet (50 mg total) by mouth daily. 90 tablet 1   Multiple Vitamin (MULTIVITAMIN WITH MINERALS) TABS tablet Take 1-2 tablets by mouth daily. flintstone vitamins Chewable with Iron     multivitamin-iron-minerals-folic acid (CENTRUM) chewable tablet Chew 1 tablet by mouth daily.     nitroGLYCERIN (NITROLINGUAL) 0.4 MG/SPRAY spray Place 1 spray under the tongue every 5 (five) minutes x 3 doses as needed for chest pain. 12 g 1   Omega-3 1000 MG CAPS Take 2,000 mg by mouth daily.      ondansetron (ZOFRAN) 4 MG tablet Take 4 mg by mouth every 8 (eight) hours as needed for nausea or vomiting.     pantoprazole (PROTONIX) 40 MG tablet Take 40 mg by mouth daily before breakfast.   1   sertraline (ZOLOFT) 25 MG tablet Take 25 mg by mouth daily.     thyroid (ARMOUR) 60 MG tablet Take 60 mg by mouth daily before breakfast.     traMADol (ULTRAM) 50 MG tablet Take 1 tablet (50 mg total) by mouth every 6 (six) hours as needed for severe pain. 50 tablet 0   vitamin C (ASCORBIC ACID) 500 MG tablet Take 500 mg by mouth daily.      apixaban (ELIQUIS) 5 MG TABS tablet Take 1 tablet (5 mg total) by mouth 2 (two) times daily. With breakfast and dinner 60 tablet 5   estradiol (ESTRACE) 0.1 MG/GM vaginal cream 1 Applicatorful as needed.     metoCLOPramide (REGLAN) 5 MG tablet Take 1 tablet (5 mg total) by mouth every 8 (eight) hours as needed for nausea (if ondansetron (ZOFRAN) ineffective.). 30 tablet 0   No facility-administered medications prior to visit.   Final Medications at End of Visit    Current Meds  Medication Sig   acetaminophen (TYLENOL) 500 MG tablet Take 1,000 mg by mouth at bedtime.   Alpha-Lipoic Acid (LIPOIC ACID PO) Take 600 mg by  mouth daily.   amiodarone (PACERONE) 200 MG tablet Take 1 tablet (200 mg total) by mouth daily.   atorvastatin (LIPITOR) 10 MG tablet TAKE 1 TABLET BY MOUTH EVERY DAY   AZO-CRANBERRY PO Take 1 tablet by mouth daily.   Calcium Carbonate-Vitamin D3 600-400 MG-UNIT TABS Take 1 tablet by mouth daily.   cycloSPORINE (RESTASIS) 0.05 % ophthalmic emulsion Place 1 drop into both eyes daily.   denosumab (PROLIA) 60 MG/ML SOSY injection Inject 60 mg into the skin every 6 (six) months.   desmopressin (DDAVP) 0.2 MG tablet Take 3 tablets by mouth as needed.   diltiazem (  CARDIZEM CD) 120 MG 24 hr capsule Take 1 capsule (120 mg total) by mouth daily.   fluticasone (FLONASE) 50 MCG/ACT nasal spray Place 1 spray into the nose at bedtime.   isosorbide mononitrate (IMDUR) 30 MG 24 hr tablet Take 0.5 tablets (15 mg total) by mouth daily.   losartan (COZAAR) 100 MG tablet Take 100 mg by mouth every evening.   meclizine (ANTIVERT) 25 MG tablet Take 25 mg by mouth daily as needed for dizziness.   Melatonin 5 MG CAPS Take 5 mg by mouth at bedtime.    methocarbamol (ROBAXIN) 500 MG tablet Take 1 tablet (500 mg total) by mouth every 6 (six) hours as needed for muscle spasms.   metoprolol succinate (TOPROL-XL) 50 MG 24 hr tablet Take 1 tablet (50 mg total) by mouth daily.   Multiple Vitamin (MULTIVITAMIN WITH MINERALS) TABS tablet Take 1-2 tablets by mouth daily. flintstone vitamins Chewable with Iron   multivitamin-iron-minerals-folic acid (CENTRUM) chewable tablet Chew 1 tablet by mouth daily.   nitroGLYCERIN (NITROLINGUAL) 0.4 MG/SPRAY spray Place 1 spray under the tongue every 5 (five) minutes x 3 doses as needed for chest pain.   Omega-3 1000 MG CAPS Take 2,000 mg by mouth daily.    ondansetron (ZOFRAN) 4 MG tablet Take 4 mg by mouth every 8 (eight) hours as needed for nausea or vomiting.   pantoprazole (PROTONIX) 40 MG tablet Take 40 mg by mouth daily before breakfast.    sertraline (ZOLOFT) 25 MG tablet Take 25  mg by mouth daily.   thyroid (ARMOUR) 60 MG tablet Take 60 mg by mouth daily before breakfast.   traMADol (ULTRAM) 50 MG tablet Take 1 tablet (50 mg total) by mouth every 6 (six) hours as needed for severe pain.   vitamin C (ASCORBIC ACID) 500 MG tablet Take 500 mg by mouth daily.    [DISCONTINUED] apixaban (ELIQUIS) 5 MG TABS tablet Take 1 tablet (5 mg total) by mouth 2 (two) times daily. With breakfast and dinner   [DISCONTINUED] estradiol (ESTRACE) 0.1 MG/GM vaginal cream 1 Applicatorful as needed.   [DISCONTINUED] metoCLOPramide (REGLAN) 5 MG tablet Take 1 tablet (5 mg total) by mouth every 8 (eight) hours as needed for nausea (if ondansetron (ZOFRAN) ineffective.).    Radiology:   No results found.  Cardiac Studies:  PCV ECHOCARDIOGRAM COMPLETE 89/38/1017 Normal LV systolic function with visual EF 60-65%. Left ventricle cavity is normal in size. Mild left ventricular hypertrophy. Normal global wall motion. Normal diastolic filling pattern, normal LAP. Mild (Grade I) mitral regurgitation. Mild calcification of the mitral valve annulus. Mild tricuspid regurgitation. No evidence of pulmonary hypertension. Compared to study 11/09/2017 no significant change.  PCV MYOCARDIAL PERFUSION WITH LEXISCAN 11/20/2020 Nondiagnostic ECG stress. Myocardial perfusion is normal. Overall LV systolic function is normal without regional wall motion abnormalities. Stress LV EF: 50%. No previous exam available for comparison. Low risk study   Event Monitor for 30 days Start date 07/01/2018 for syncope : A. Fib one episode, no symptoms reported. No heart block or significant bradycardia  Carotid duplex 03/27/2018:  Right Carotid: Velocities in the right ICA are consistent with a 1-39% stenosis. Left Carotid: Velocities in the left ICA are consistent with a 1-39% stenosis. Vertebrals:  Bilateral vertebral arteries demonstrate antegrade flow. Subclavians: Normal flow hemodynamics were seen in bilateral  subclavian arteries.   Event Monitor 30 days 11/11/2017: Predominant rhythm is normal sinus rhythm. Symptoms of fatigue reveals normal sinus rhythm. Asymptomatic atrial fibrillation and probable atypical atrial flutter noted on  11/13/2017, 11/18/2017 and 12/09/2017 at 3:00 AM. Occasional PACs. Arrhythmia/PVC burden 6%.   Echo 11/09/2017: Left ventricle: The cavity size was normal. Systolic function was   normal. The estimated ejection fraction was in the range of 60% to 65%. Wall motion was normal; there were no regional wall   motion abnormalities. The study is not technically sufficient to   allow evaluation of LV diastolic function.Pulmonary arteries: PA peak pressure: 32 mm Hg (S).   Coronary angiogram 11/24/2016:  Normal coronaries; tortuous LAD  Loop Recorder   Loop recorder implantation 05/23/2020  Scheduled Remote loop recorder check 06/04/2020: Unremarkable transmission. Occasional PVC. No heart block, no atrial fibrillation. No symptoms reported  Remote loop recorder transmission 07/15/2021:  Patient in paroxysmal atrial fibrillation since 06/10/2021. V monitoring episodes + AF with RVR, brief. AT/AF burden <1%   EKG:  07/29/2021: Sinus rhythm at a rate of 56 bpm.  Left atrial enlargement.  Left axis, left anterior fascicular block.  Incomplete right bundle branch block.  No evidence of ischemia or underlying injury pattern.  Compared EKG 06/25/2021, no significant change.  EKG 02/02/2020: Sinus tachycardia at the rate of 103 bpm, left axis deviation, left anterior fascicular block.  Incomplete right bundle branch block.  Poor R wave progression, cannot exclude anteroseptal infarct old.  Nonspecific T abnormality.  No significant change from 06/12/2019.  EKG 06/20/2018: Atrial tachycardia at the rate of 133 bpm.  Left axis deviation, left anterior fascicular block.  Poor R-wave progression.  Incomplete right bundle branch block.  Assessment     ICD-10-CM   1. Orthostatic hypotension   I95.1     2. Paroxysmal atrial fibrillation (HCC)  I48.0 EKG 12-Lead    3. Enrolled in clinical trial of drug - OCEANIC-AF trial Asundexian vs. Apixaban  Z00.6       No orders of the defined types were placed in this encounter.   Medications Discontinued During This Encounter  Medication Reason   apixaban (ELIQUIS) 5 MG TABS tablet    estradiol (ESTRACE) 0.1 MG/GM vaginal cream    metoCLOPramide (REGLAN) 5 MG tablet    This patients CHA2DS2-VASc Score 4 (HTN, F, Age)and yearly risk of stroke 4.8%.   Recommendations:   Brooklin Rieger  is a 81 y.o. Caucasian female with hypertension, hyperlipidemia, frequent UTI,  paroxysmal atrial fibrillation, atrial tachycardia, obstructive sleep apnea on CPAP. Tolerating anticoagulation well. Flecainide discontinued due to dizziness. In the past she has had recurrent episodes of near syncope and severe orthostatic changes with recurrent UTI.  However patient with episodes of syncope and near syncope not associated with UTI.  Patient underwent surgery to her right foot 03/04/2020 and had an episode of syncope while at rehabilitation facility on 04/10/2020.   Recorder transmission 06/2021 revealed persistent atrial fibrillation with RVR, patient was therefore started on amiodarone.   Patient is currently enrolled in a OCEANIC-AF trial Asundexian vs. Apixaban.  She continues to tolerate anticoagulation without bleeding diathesis.  Following last office visit patient came back with all of her medication bottles to do a complete medication reconciliation and blood pressure recheck.  Patient's medication list was updated and she was advised to discontinue hydrochlorothiazide altogether.  Last blood pressure check patient brought her home monitor which was reading high compared to office readings.  Patient now presents for 6-week follow-up of blood pressure management.  Blood pressure remains well controlled.  She is feeling well overall with no recurrence of chest  pain.  Will not make changes to medications at this  time.  As patient is presently being treated with amiodarone, they will need annual monitoring of PFTs, thyroid function, liver function, and ophthalmologic exam. Patient is aware of these monitoring parameters and agrees.   Follow-up in 6 months, sooner if needed.   Alethia Berthold, PA-C 09/01/2021, 11:36 AM Office: [phone]

## 2021-09-01 ENCOUNTER — Ambulatory Visit: Payer: Medicare PPO | Admitting: Student

## 2021-09-01 ENCOUNTER — Encounter: Payer: Self-pay | Admitting: Student

## 2021-09-01 VITALS — BP 133/70 | HR 61 | Temp 97.3°F | Resp 16 | Ht 65.0 in | Wt 137.8 lb

## 2021-09-01 DIAGNOSIS — Z006 Encounter for examination for normal comparison and control in clinical research program: Secondary | ICD-10-CM

## 2021-09-01 DIAGNOSIS — I480 Paroxysmal atrial fibrillation: Secondary | ICD-10-CM | POA: Diagnosis not present

## 2021-09-01 DIAGNOSIS — I951 Orthostatic hypotension: Secondary | ICD-10-CM

## 2021-09-04 DIAGNOSIS — M810 Age-related osteoporosis without current pathological fracture: Secondary | ICD-10-CM | POA: Diagnosis not present

## 2021-09-04 DIAGNOSIS — I10 Essential (primary) hypertension: Secondary | ICD-10-CM | POA: Diagnosis not present

## 2021-09-04 DIAGNOSIS — Z7901 Long term (current) use of anticoagulants: Secondary | ICD-10-CM | POA: Diagnosis not present

## 2021-09-04 DIAGNOSIS — E7800 Pure hypercholesterolemia, unspecified: Secondary | ICD-10-CM | POA: Diagnosis not present

## 2021-09-04 DIAGNOSIS — I480 Paroxysmal atrial fibrillation: Secondary | ICD-10-CM | POA: Diagnosis not present

## 2021-09-04 DIAGNOSIS — E0590 Thyrotoxicosis, unspecified without thyrotoxic crisis or storm: Secondary | ICD-10-CM | POA: Diagnosis not present

## 2021-09-06 ENCOUNTER — Telehealth: Payer: Self-pay | Admitting: Pulmonary Disease

## 2021-09-06 DIAGNOSIS — G4733 Obstructive sleep apnea (adult) (pediatric): Secondary | ICD-10-CM | POA: Diagnosis not present

## 2021-09-06 NOTE — Telephone Encounter (Signed)
Call patient  Sleep study result  Date of study: 08/28/2021  Impression: Severe obstructive sleep apnea Mild oxygen desaturations  Recommendation: DME referral  Recommend CPAP therapy for severe obstructive sleep apnea  Auto titrating CPAP with pressure settings of 5-20 will be appropriate  Encourage weight loss measures  Follow-up in the office 4 to 6 weeks following initiation of treatment

## 2021-09-08 ENCOUNTER — Other Ambulatory Visit: Payer: Self-pay | Admitting: Cardiology

## 2021-09-08 DIAGNOSIS — I4819 Other persistent atrial fibrillation: Secondary | ICD-10-CM

## 2021-09-09 NOTE — Telephone Encounter (Signed)
I called the patient and was unable to leave a message due to VM being full.

## 2021-09-09 NOTE — Telephone Encounter (Signed)
Okay to refill? 

## 2021-09-15 DIAGNOSIS — Z4509 Encounter for adjustment and management of other cardiac device: Secondary | ICD-10-CM | POA: Diagnosis not present

## 2021-09-15 DIAGNOSIS — Z95818 Presence of other cardiac implants and grafts: Secondary | ICD-10-CM | POA: Diagnosis not present

## 2021-09-15 DIAGNOSIS — R55 Syncope and collapse: Secondary | ICD-10-CM | POA: Diagnosis not present

## 2021-09-16 ENCOUNTER — Telehealth: Payer: Self-pay

## 2021-09-16 NOTE — Telephone Encounter (Signed)
Continue to monitor and notify us or PCP if she has recurrence of these episodes.  Recommend liberal hydration.

## 2021-09-19 NOTE — Telephone Encounter (Signed)
Tried calling patient no answer wasn't able to leave a vm

## 2021-09-19 NOTE — Telephone Encounter (Signed)
Patient has an OV 09/22/21 to discuss her results. Nothing further needed.

## 2021-09-22 ENCOUNTER — Ambulatory Visit: Payer: Medicare PPO | Admitting: Student

## 2021-09-22 ENCOUNTER — Ambulatory Visit (INDEPENDENT_AMBULATORY_CARE_PROVIDER_SITE_OTHER): Payer: Medicare PPO | Admitting: Pulmonary Disease

## 2021-09-22 ENCOUNTER — Encounter: Payer: Self-pay | Admitting: Student

## 2021-09-22 ENCOUNTER — Encounter: Payer: Self-pay | Admitting: Pulmonary Disease

## 2021-09-22 VITALS — BP 117/66 | HR 57 | Temp 97.0°F | Resp 16 | Ht 65.0 in | Wt 140.8 lb

## 2021-09-22 VITALS — BP 118/62 | HR 58 | Ht 65.0 in | Wt 140.0 lb

## 2021-09-22 DIAGNOSIS — Z9989 Dependence on other enabling machines and devices: Secondary | ICD-10-CM

## 2021-09-22 DIAGNOSIS — I951 Orthostatic hypotension: Secondary | ICD-10-CM

## 2021-09-22 DIAGNOSIS — Z006 Encounter for examination for normal comparison and control in clinical research program: Secondary | ICD-10-CM

## 2021-09-22 DIAGNOSIS — G4733 Obstructive sleep apnea (adult) (pediatric): Secondary | ICD-10-CM | POA: Diagnosis not present

## 2021-09-22 DIAGNOSIS — I480 Paroxysmal atrial fibrillation: Secondary | ICD-10-CM | POA: Diagnosis not present
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
## 2021-09-22 NOTE — Patient Instructions (Signed)
Severe obstructive sleep apnea on recent sleep study  DME referral for CPAP supplies  Auto CPAP 5-15 with heated humidification, patient's mask of choice  Tentative follow-up in 3 months  Encourage graded activities as tolerated

## 2021-09-22 NOTE — Addendum Note (Signed)
Addended by: Lonzo Cloud on: 09/22/2021 02:37 PM   Modules accepted: Orders

## 2021-09-22 NOTE — Progress Notes (Signed)
Primary Physician/Referring:  Deland Pretty, MD  [name], female    DOB: 07-Nov-1940, 81 y.o.   MRN: [id number]  Chief Complaint  Patient presents with   Atrial Fibrillation   Hypertension   Follow-up   HPI:    Dyasia Firestine  is a 81 y.o. Caucasian female with hypertension, hyperlipidemia, frequent UTI,  paroxysmal atrial fibrillation, atrial tachycardia, obstructive sleep apnea on CPAP. Tolerating anticoagulation well. Flecainide discontinued due to dizziness. In the past she has had recurrent episodes of near syncope and severe orthostatic changes with recurrent UTI.  However patient with episodes of syncope and near syncope not associated with UTI.  Patient underwent surgery to her right foot 03/04/2020 and had an episode of syncope while at rehabilitation facility on 04/10/2020.   Recorder transmission 06/2021 revealed persistent atrial fibrillation with RVR, patient was therefore started on amiodarone.   Patient is accompanied by her daughter and husband at today's visit who contribute to history.   Patient was last seen in the office 09/01/2021 at which time she was stable from a cardiovascular standpoint and no changes were made.  Patient was advised to follow-up in 6 months, however she presents for sooner evaluation with concerns of dizziness.  Upon further discussion it becomes clear that patient has continued to have difficulty with medication compliance potentially missing doses, taking medications at improper times, or taking too many of the medications.  Patient is currently enrolled in a [place] clinical trial and appears to have been taking double the prescribed amount of study drug.  Patient denies chest pain, dyspnea.  She denies evidence of bleeding.  In regard to dizziness she reports this is intermittent, worsening with exertion. No events of atrial fibrillation recently on loop recorder transmissions.  Patient is currently enrolled in a OCEANIC-AF trial  Asundexian vs. Apixaban.  She continues to tolerate anticoagulation without bleeding diathesis.  Past Medical History:  Diagnosis Date   Acute GI bleeding 10/27/2017   Arthritis    "joints" (11/10/2017)   Dizziness    Dysrhythmia    afib   Encounter for loop recorder check 06/09/2020   GERD (gastroesophageal reflux disease)    GI bleed 11/06/2017   Hearing loss    History of blood transfusion 10/2017   Hyperlipidemia    Hypertension    Hyperthyroidism    Loop recorder [place] Loop Recorder 05/23/2020 05/23/2020   Melanoma ([place])    "cut off my back"   Memory loss    Pneumonia    Sleep apnea    cpap   Syncope and collapse    Past Surgical History:  Procedure Laterality Date   ABDOMINAL HYSTERECTOMY     "partial"   APPENDECTOMY     BIOPSY  11/17/2017   Procedure: BIOPSY;  Surgeon: Clarene Essex, MD;  Location: [facility];  Service: Endoscopy;;   COLONOSCOPY WITH PROPOFOL N/A 11/17/2017   Procedure: COLONOSCOPY WITH PROPOFOL;  Surgeon: Clarene Essex, MD;  Location: [facility];  Service: Endoscopy;  Laterality: N/A;   CRANIECTOMY FOR EXCISION OF ACOUSTIC NEUROMA     ESOPHAGOGASTRODUODENOSCOPY (EGD) WITH PROPOFOL N/A 10/28/2017   Procedure: ESOPHAGOGASTRODUODENOSCOPY (EGD) WITH PROPOFOL;  Surgeon: Clarene Essex, MD;  Location: [facility];  Service: Endoscopy;  Laterality: N/A;   FOOT SURGERY Bilateral 04/18/2020   KNEE ARTHROPLASTY Right 06/05/2021   Procedure: COMPUTER ASSISTED TOTAL KNEE ARTHROPLASTY;  Surgeon: Rod Can, MD;  Location: [facility];  Service: Orthopedics;  Laterality: Right;   KNEE ARTHROSCOPY Left    LEFT  HEART CATH AND CORONARY ANGIOGRAPHY N/A 11/24/2016   Procedure: LEFT HEART CATH AND CORONARY ANGIOGRAPHY;  Surgeon: Jettie Booze, MD;  Location: [facility];  Service: Cardiovascular;  Laterality: N/A;   MELANOMA EXCISION     "off my back"   NASAL SINUS SURGERY     TONSILLECTOMY     Family History  Problem Relation Age of Onset   Congestive  Heart Failure Mother    Heart disease Father        History of heart attacks at a later age.     Heart disease Brother    Hearing loss Brother     Social History   Tobacco Use   Smoking status: Former    Years: 2.00    Types: Cigarettes    Quit date: 1980    Years since quitting: 43.4   Smokeless tobacco: Never   Tobacco comments:    11/10/2017 "only smoked when I was on my period"  Substance Use Topics   Alcohol use: Yes    Comment: wine on occas  Marital Status: Married   ROS  Review of Systems  Constitutional: Negative for weight gain.  Cardiovascular:  Negative for chest pain, claudication, dyspnea on exertion, leg swelling, near-syncope, orthopnea, palpitations, paroxysmal nocturnal dyspnea and syncope.  Respiratory:  Negative for shortness of breath.   Neurological:  Positive for dizziness.    Objective  Blood pressure 117/66, pulse (!) 57, temperature (!) 97 F (36.1 C), temperature source Temporal, resp. rate 16, height '5\' 5"'  (1.651 m), weight 140 lb 12.8 oz (63.9 kg), SpO2 96 %.     09/22/2021   10:39 AM 09/01/2021   11:01 AM 09/01/2021   10:49 AM  Vitals with BMI  Height '5\' 5"'   '5\' 5"'   Weight 140 lbs 13 oz  137 lbs 13 oz  BMI 26.20  35.59  Systolic [id number]  Diastolic 66 70 53  Pulse 57 61 75    Physical Exam Vitals reviewed.  Constitutional:      General: She is not in acute distress.    Comments: Patient using walker  Cardiovascular:     Rate and Rhythm: Normal rate and regular rhythm.     Pulses: Intact distal pulses.     Heart sounds: Normal heart sounds, S1 normal and S2 normal. No murmur heard.    No gallop.     Comments: No JVD. Pulmonary:     Effort: Pulmonary effort is normal. No respiratory distress.     Breath sounds: Normal breath sounds. No wheezing, rhonchi or rales.  Musculoskeletal:     Right lower leg: No edema.     Left lower leg: No edema.  Neurological:     Mental Status: She is alert.   Physical exam unchanged compared to  previous office visit.  Laboratory examination:   Recent Labs    06/02/21 0950 06/06/21 0323 07/26/21 1017  NA 134* 137 139  K 3.8 4.0 3.9  CL 100 108 106  CO2 '28 23 26  ' GLUCOSE 90 156* 109*  BUN 25* 24* 17  CREATININE 0.75 0.69 0.78  CALCIUM 9.1 8.6* 9.5  GFRNONAA >60 >60 >60   CrCl cannot be calculated (Patient's most recent lab result is older than the maximum 21 days allowed.).     Latest Ref Rng & Units 07/26/2021   10:17 AM 06/06/2021    3:23 AM 06/02/2021    9:50 AM  CMP  Glucose 70 - 99 mg/dL 109  156  90   BUN 8 - 23 mg/dL '17  24  25   ' Creatinine 0.44 - 1.00 mg/dL 0.78  0.69  0.75   Sodium 135 - 145 mmol/L 139  137  134   Potassium 3.5 - 5.1 mmol/L 3.9  4.0  3.8   Chloride 98 - 111 mmol/L 106  108  100   CO2 22 - 32 mmol/L '26  23  28   ' Calcium 8.9 - 10.3 mg/dL 9.5  8.6  9.1       Latest Ref Rng & Units 07/26/2021   10:17 AM 06/08/2021    3:43 AM 06/07/2021    3:14 AM  CBC  WBC 4.0 - 10.5 K/uL 8.9  12.4  14.3   Hemoglobin 12.0 - 15.0 g/dL 11.5  8.9  8.7   Hematocrit 36.0 - 46.0 % 36.2  26.5  26.3   Platelets 150 - 400 K/uL 192  141  136    Lipid Panel     Component Value Date/Time   CHOL 165 11/24/2016 0040   TRIG 88 11/24/2016 0040   HDL 61 11/24/2016 0040   CHOLHDL 2.7 11/24/2016 0040   VLDL 18 11/24/2016 0040   LDLCALC 86 11/24/2016 0040   HEMOGLOBIN A1C Lab Results  Component Value Date   HGBA1C 5.6 11/24/2016   MPG 114 11/24/2016   TSH No results for input(s): "TSH" in the last 8760 hours.   External labs:  06/13/2020: Glucose 89, sodium 141, potassium 3.8, BUN 34, creatinine 0.8, GFR >60, AST 16, ALT 23, alk phos 40  03/11/2020: Hemoglobin 10.9, hematocrit 34.2, platelet 241, MCV 94.7 Total cholesterol 165, triglycerides 56, HDL 65, LDL 89 TSH 0.24  Cholesterol, total 145.000 m 09/01/2019 HDL 67.000 mg 10/13/2019 LDL-C 94.000 cal 10/13/2019 Triglycerides 80.000 mg 10/13/2019  Hemoglobin 10.900 g/d 01/24/2020 INR 1.160  11/06/2017 Platelets 197.000 K/ 01/24/2020  Creatinine, Serum 0.580 mg/ 01/24/2020 Potassium 3.500 mm 01/24/2020 ALT (SGPT) 15.000 IU/ 09/01/2019  TSH 0.870 10/13/2019  Hemoglobin 9.300 12/14/2018;  INR 1.160 11/06/2017 Platelets 188.000 12/14/2018  Creatinine, Serum 0.970 12/14/2018 Potassium 4.000 12/14/2018 Magnesium N/D ALT (SGPT) 15.000 12/14/2018  Labs 11/02/2018: BUN 26, creatinine 0.9, EGFR greater than 60 mL, Hb 10.6/HCT 32.2, platelets 244.   Allergies   Allergies  Allergen Reactions   Flecainide Other (See Comments)    Severe dizziness   Oxycodone Other (See Comments)    uphoria   Trimethoprim Other (See Comments)    Other reaction(s): hives   Alendronate Sodium Nausea Only   Avelox [Moxifloxacin Hcl In Nacl] Other (See Comments)    Unknown reaction   Ciprofloxacin Other (See Comments)    Other reaction(s): multiple medication interactions   Other Other (See Comments)    Avelox+cymbalta Together Other reaction(s): flu like symptoms    Medications Prior to Visit:   Outpatient Medications Prior to Visit  Medication Sig Dispense Refill   acetaminophen (TYLENOL) 500 MG tablet Take 1,000 mg by mouth at bedtime.     Alpha-Lipoic Acid (LIPOIC ACID PO) Take 600 mg by mouth daily.     amiodarone (PACERONE) 200 MG tablet Take 1 tablet (200 mg total) by mouth daily. 90 tablet 3   atorvastatin (LIPITOR) 10 MG tablet TAKE 1 TABLET BY MOUTH EVERY DAY 90 tablet 3   AZO-CRANBERRY PO Take 1 tablet by mouth daily.     Calcium Carbonate-Vitamin D3 600-400 MG-UNIT TABS Take 1 tablet by mouth daily.     cycloSPORINE (RESTASIS) 0.05 % ophthalmic emulsion Place 1 drop into  both eyes daily.     denosumab (PROLIA) 60 MG/ML SOSY injection Inject 60 mg into the skin every 6 (six) months.     desmopressin (DDAVP) 0.2 MG tablet Take 3 tablets by mouth as needed.     diltiazem (CARDIZEM CD) 120 MG 24 hr capsule Take 1 capsule (120 mg total) by mouth daily. 90 capsule 1   fluticasone (FLONASE) 50  MCG/ACT nasal spray Place 1 spray into the nose at bedtime.     isosorbide mononitrate (IMDUR) 30 MG 24 hr tablet Take 0.5 tablets (15 mg total) by mouth daily. 30 tablet 3   losartan (COZAAR) 100 MG tablet Take 100 mg by mouth every evening.     meclizine (ANTIVERT) 25 MG tablet Take 25 mg by mouth daily as needed for dizziness.     Melatonin 5 MG CAPS Take 5 mg by mouth at bedtime.      methocarbamol (ROBAXIN) 500 MG tablet Take 1 tablet (500 mg total) by mouth every 6 (six) hours as needed for muscle spasms. 60 tablet 0   metoprolol succinate (TOPROL-XL) 25 MG 24 hr tablet Take 25 mg by mouth daily.     Multiple Vitamin (MULTIVITAMIN WITH MINERALS) TABS tablet Take 1-2 tablets by mouth daily. flintstone vitamins Chewable with Iron     multivitamin-iron-minerals-folic acid (CENTRUM) chewable tablet Chew 1 tablet by mouth daily.     nitroGLYCERIN (NITROLINGUAL) 0.4 MG/SPRAY spray Place 1 spray under the tongue every 5 (five) minutes x 3 doses as needed for chest pain. 12 g 1   Omega-3 1000 MG CAPS Take 2,000 mg by mouth daily.      pantoprazole (PROTONIX) 40 MG tablet Take 40 mg by mouth daily before breakfast.   1   sertraline (ZOLOFT) 25 MG tablet Take 25 mg by mouth daily.     thyroid (ARMOUR) 60 MG tablet Take 60 mg by mouth daily before breakfast.     traMADol (ULTRAM) 50 MG tablet Take 1 tablet (50 mg total) by mouth every 6 (six) hours as needed for severe pain. 50 tablet 0   metoprolol succinate (TOPROL-XL) 50 MG 24 hr tablet Take 1 tablet (50 mg total) by mouth daily. 90 tablet 1   ondansetron (ZOFRAN) 4 MG tablet Take 4 mg by mouth every 8 (eight) hours as needed for nausea or vomiting.     vitamin C (ASCORBIC ACID) 500 MG tablet Take 500 mg by mouth daily.      No facility-administered medications prior to visit.   Final Medications at End of Visit    Current Meds  Medication Sig   acetaminophen (TYLENOL) 500 MG tablet Take 1,000 mg by mouth at bedtime.   Alpha-Lipoic Acid  (LIPOIC ACID PO) Take 600 mg by mouth daily.   amiodarone (PACERONE) 200 MG tablet Take 1 tablet (200 mg total) by mouth daily.   atorvastatin (LIPITOR) 10 MG tablet TAKE 1 TABLET BY MOUTH EVERY DAY   AZO-CRANBERRY PO Take 1 tablet by mouth daily.   Calcium Carbonate-Vitamin D3 600-400 MG-UNIT TABS Take 1 tablet by mouth daily.   cycloSPORINE (RESTASIS) 0.05 % ophthalmic emulsion Place 1 drop into both eyes daily.   denosumab (PROLIA) 60 MG/ML SOSY injection Inject 60 mg into the skin every 6 (six) months.   desmopressin (DDAVP) 0.2 MG tablet Take 3 tablets by mouth as needed.   diltiazem (CARDIZEM CD) 120 MG 24 hr capsule Take 1 capsule (120 mg total) by mouth daily.   fluticasone (FLONASE) 50 MCG/ACT nasal  spray Place 1 spray into the nose at bedtime.   isosorbide mononitrate (IMDUR) 30 MG 24 hr tablet Take 0.5 tablets (15 mg total) by mouth daily.   losartan (COZAAR) 100 MG tablet Take 100 mg by mouth every evening.   meclizine (ANTIVERT) 25 MG tablet Take 25 mg by mouth daily as needed for dizziness.   Melatonin 5 MG CAPS Take 5 mg by mouth at bedtime.    methocarbamol (ROBAXIN) 500 MG tablet Take 1 tablet (500 mg total) by mouth every 6 (six) hours as needed for muscle spasms.   metoprolol succinate (TOPROL-XL) 25 MG 24 hr tablet Take 25 mg by mouth daily.   Multiple Vitamin (MULTIVITAMIN WITH MINERALS) TABS tablet Take 1-2 tablets by mouth daily. flintstone vitamins Chewable with Iron   multivitamin-iron-minerals-folic acid (CENTRUM) chewable tablet Chew 1 tablet by mouth daily.   nitroGLYCERIN (NITROLINGUAL) 0.4 MG/SPRAY spray Place 1 spray under the tongue every 5 (five) minutes x 3 doses as needed for chest pain.   Omega-3 1000 MG CAPS Take 2,000 mg by mouth daily.    pantoprazole (PROTONIX) 40 MG tablet Take 40 mg by mouth daily before breakfast.    sertraline (ZOLOFT) 25 MG tablet Take 25 mg by mouth daily.   thyroid (ARMOUR) 60 MG tablet Take 60 mg by mouth daily before breakfast.    traMADol (ULTRAM) 50 MG tablet Take 1 tablet (50 mg total) by mouth every 6 (six) hours as needed for severe pain.    Radiology:   No results found.  Cardiac Studies:  PCV ECHOCARDIOGRAM COMPLETE 91/47/8295 Normal LV systolic function with visual EF 60-65%. Left ventricle cavity is normal in size. Mild left ventricular hypertrophy. Normal global wall motion. Normal diastolic filling pattern, normal LAP. Mild (Grade I) mitral regurgitation. Mild calcification of the mitral valve annulus. Mild tricuspid regurgitation. No evidence of pulmonary hypertension. Compared to study 11/09/2017 no significant change.  PCV MYOCARDIAL PERFUSION WITH LEXISCAN 11/20/2020 Nondiagnostic ECG stress. Myocardial perfusion is normal. Overall LV systolic function is normal without regional wall motion abnormalities. Stress LV EF: 50%. No previous exam available for comparison. Low risk study   Event Monitor for 30 days Start date 07/01/2018 for syncope : A. Fib one episode, no symptoms reported. No heart block or significant bradycardia  Carotid duplex 03/27/2018:  Right Carotid: Velocities in the right ICA are consistent with a 1-39% stenosis. Left Carotid: Velocities in the left ICA are consistent with a 1-39% stenosis. Vertebrals:  Bilateral vertebral arteries demonstrate antegrade flow. Subclavians: Normal flow hemodynamics were seen in bilateral subclavian arteries.   Event Monitor 30 days 11/11/2017: Predominant rhythm is normal sinus rhythm. Symptoms of fatigue reveals normal sinus rhythm. Asymptomatic atrial fibrillation and probable atypical atrial flutter noted on 11/13/2017, 11/18/2017 and 12/09/2017 at 3:00 AM. Occasional PACs. Arrhythmia/PVC burden 6%.   Echo 11/09/2017: Left ventricle: The cavity size was normal. Systolic function was   normal. The estimated ejection fraction was in the range of 60% to 65%. Wall motion was normal; there were no regional wall   motion abnormalities. The study is  not technically sufficient to   allow evaluation of LV diastolic function.Pulmonary arteries: PA peak pressure: 32 mm Hg (S).   Coronary angiogram 11/24/2016:  Normal coronaries; tortuous LAD  Loop Recorder   Loop recorder implantation 05/23/2020  Scheduled Remote loop recorder check 06/04/2020: Unremarkable transmission. Occasional PVC. No heart block, no atrial fibrillation. No symptoms reported  Remote loop recorder transmission 09/15/2021: Predominant rhythm is normal sinus rhythm. No further  atrial fibrillation since 07/04/2021. AT/AF burden 0.1%.  EKG:  09/22/2021: Sinus rhythm at a rate of 56 bpm.  07/29/2021: Sinus rhythm at a rate of 56 bpm.  Left atrial enlargement.  Left axis, left anterior fascicular block.  Incomplete right bundle branch block.  No evidence of ischemia or underlying injury pattern.  Compared EKG 06/25/2021, no significant change.  EKG 02/02/2020: Sinus tachycardia at the rate of 103 bpm, left axis deviation, left anterior fascicular block.  Incomplete right bundle branch block.  Poor R wave progression, cannot exclude anteroseptal infarct old.  Nonspecific T abnormality.  No significant change from 06/12/2019.  EKG 06/20/2018: Atrial tachycardia at the rate of 133 bpm.  Left axis deviation, left anterior fascicular block.  Poor R-wave progression.  Incomplete right bundle branch block.  Assessment     ICD-10-CM   1. Orthostatic hypotension  I95.1     2. Paroxysmal atrial fibrillation (HCC)  I48.0 EKG 12-Lead    CBC    3. Enrolled in clinical trial of drug - [place]CEANIC-AF trial Asundexian vs. Apixaban  Z00.6       No orders of the defined types were placed in this encounter.   Medications Discontinued During This Encounter  Medication Reason   metoprolol succinate (TOPROL-XL) 50 MG 24 hr tablet Change in therapy   vitamin C (ASCORBIC ACID) 500 MG tablet    ondansetron (ZOFRAN) 4 MG tablet    This patients CHA2DS2-VASc Score 4 (HTN, F, Age)and yearly risk of  stroke 4.8%.   Recommendations:   Luwanda Starr  is a 81 y.o. Caucasian female with hypertension, hyperlipidemia, frequent UTI,  paroxysmal atrial fibrillation, atrial tachycardia, obstructive sleep apnea on CPAP. Tolerating anticoagulation well. Flecainide discontinued due to dizziness. In the past she has had recurrent episodes of near syncope and severe orthostatic changes with recurrent UTI.  However patient with episodes of syncope and near syncope not associated with UTI.  Patient underwent surgery to her right foot 03/04/2020 and had an episode of syncope while at rehabilitation facility on 04/10/2020.   Recorder transmission 06/2021 revealed persistent atrial fibrillation with RVR, patient was therefore started on amiodarone.   Patient is currently enrolled in a [place]CEANIC-AF trial Asundexian vs. Apixaban.  She continues to tolerate anticoagulation without bleeding diathesis.  Patient was last seen in the office 09/01/2021 at which time she was stable from a cardiovascular standpoint and no changes were made.  Patient was advised to follow-up in 6 months, however she presents for sooner evaluation with concerns of dizziness and medication compliance difficulty.  Patient was previously doing well, suspect medication compliance issues may be contributing to her present symptoms of dizziness.  Will not make changes at today's office visit, however discussed at length with patient family regarding medication compliance strategies.  Patient's daughter Will to go over handling feeling patient's pillboxes.  In regard to patient taking extra study medication, will obtain CBC.  She denies evidence of bleeding.   Follow up as previously scheduled, sooner if needed.    Alethia Berthold, PA-C 09/22/2021, 12:09 PM Office: [phone]

## 2021-09-23 DIAGNOSIS — R399 Unspecified symptoms and signs involving the genitourinary system: Secondary | ICD-10-CM | POA: Diagnosis not present

## 2021-09-23 NOTE — Telephone Encounter (Signed)
Pt was seen in office.

## 2021-09-24 DIAGNOSIS — I480 Paroxysmal atrial fibrillation: Secondary | ICD-10-CM | POA: Diagnosis not present

## 2021-09-25 LAB — CBC
Hematocrit: 34.4 % (ref 34.0–46.6)
Hemoglobin: 12 g/dL (ref 11.1–15.9)
MCH: 32.3 pg (ref 26.6–33.0)
MCHC: 34.9 g/dL (ref 31.5–35.7)
MCV: 93 fL (ref 79–97)
Platelets: 271 10*3/uL (ref 150–450)
RBC: 3.71 x10E6/uL — ABNORMAL LOW (ref 3.77–5.28)
RDW: 13.1 % (ref 11.7–15.4)
WBC: 6.7 10*3/uL (ref 3.4–10.8)

## 2021-09-25 NOTE — Progress Notes (Signed)
CBC normal, no evidence of anemia/bleeding

## 2021-09-26 NOTE — Progress Notes (Signed)
Called patient, NA, mailbox if ull , cannot leave a message.

## 2021-09-26 NOTE — Progress Notes (Signed)
Patient called back with patient regarding Her recent lab results. Patient also wanted to know if you think it is safe for her to take "Balance of Petra Kuba" supplement. Please advise.

## 2021-09-27 DIAGNOSIS — D6869 Other thrombophilia: Secondary | ICD-10-CM | POA: Diagnosis not present

## 2021-09-27 DIAGNOSIS — I4891 Unspecified atrial fibrillation: Secondary | ICD-10-CM | POA: Diagnosis not present

## 2021-09-27 DIAGNOSIS — M5416 Radiculopathy, lumbar region: Secondary | ICD-10-CM | POA: Diagnosis not present

## 2021-09-29 DIAGNOSIS — R351 Nocturia: Secondary | ICD-10-CM | POA: Diagnosis not present

## 2021-09-29 DIAGNOSIS — N390 Urinary tract infection, site not specified: Secondary | ICD-10-CM | POA: Diagnosis not present

## 2021-09-29 DIAGNOSIS — F0390 Unspecified dementia without behavioral disturbance: Secondary | ICD-10-CM | POA: Diagnosis not present

## 2021-09-29 DIAGNOSIS — M545 Low back pain, unspecified: Secondary | ICD-10-CM | POA: Diagnosis not present

## 2021-09-29 DIAGNOSIS — R350 Frequency of micturition: Secondary | ICD-10-CM | POA: Diagnosis not present

## 2021-09-29 DIAGNOSIS — R413 Other amnesia: Secondary | ICD-10-CM | POA: Diagnosis not present

## 2021-09-29 DIAGNOSIS — N952 Postmenopausal atrophic vaginitis: Secondary | ICD-10-CM | POA: Diagnosis not present

## 2021-09-29 NOTE — Progress Notes (Signed)
She may take it if she wishes

## 2021-09-30 DIAGNOSIS — Z471 Aftercare following joint replacement surgery: Secondary | ICD-10-CM | POA: Diagnosis not present

## 2021-09-30 DIAGNOSIS — Z96651 Presence of right artificial knee joint: Secondary | ICD-10-CM | POA: Diagnosis not present

## 2021-09-30 NOTE — Progress Notes (Signed)
Called patient, NA, LMAM

## 2021-09-30 NOTE — Progress Notes (Signed)
Called patient, she is aware that is it safe to take this supplement.

## 2021-10-06 ENCOUNTER — Ambulatory Visit (INDEPENDENT_AMBULATORY_CARE_PROVIDER_SITE_OTHER): Payer: Medicare PPO | Admitting: Neurology

## 2021-10-06 ENCOUNTER — Encounter: Payer: Self-pay | Admitting: Neurology

## 2021-10-06 VITALS — BP 165/81 | HR 66 | Ht 65.0 in | Wt 142.0 lb

## 2021-10-06 DIAGNOSIS — R413 Other amnesia: Secondary | ICD-10-CM

## 2021-10-06 DIAGNOSIS — G9389 Other specified disorders of brain: Secondary | ICD-10-CM

## 2021-10-06 DIAGNOSIS — R269 Unspecified abnormalities of gait and mobility: Secondary | ICD-10-CM

## 2021-10-06 DIAGNOSIS — R3915 Urgency of urination: Secondary | ICD-10-CM | POA: Diagnosis not present

## 2021-10-06 DIAGNOSIS — G912 (Idiopathic) normal pressure hydrocephalus: Secondary | ICD-10-CM | POA: Diagnosis not present

## 2021-10-06 DIAGNOSIS — R351 Nocturia: Secondary | ICD-10-CM

## 2021-10-06 DIAGNOSIS — G629 Polyneuropathy, unspecified: Secondary | ICD-10-CM

## 2021-10-06 DIAGNOSIS — G4733 Obstructive sleep apnea (adult) (pediatric): Secondary | ICD-10-CM

## 2021-10-06 DIAGNOSIS — E559 Vitamin D deficiency, unspecified: Secondary | ICD-10-CM | POA: Diagnosis not present

## 2021-10-06 DIAGNOSIS — Z9989 Dependence on other enabling machines and devices: Secondary | ICD-10-CM

## 2021-10-07 LAB — VITAMIN B12: Vitamin B-12: 368 pg/mL (ref 232–1245)

## 2021-10-07 LAB — VITAMIN D 25 HYDROXY (VIT D DEFICIENCY, FRACTURES): Vit D, 25-Hydroxy: 38.1 ng/mL (ref 30.0–100.0)

## 2021-10-08 ENCOUNTER — Telehealth: Payer: Self-pay

## 2021-10-08 NOTE — Telephone Encounter (Signed)
I called the pt and relayed lab results. She verbalized understanding and appreciation for the call.

## 2021-10-08 NOTE — Telephone Encounter (Signed)
-----   Message from [name], MD sent at 10/07/2021 12:28 PM EDT ----- Please let her know that the vitamin D and vitamin B12 levels were fine.

## 2021-10-13 DIAGNOSIS — M545 Low back pain, unspecified: Secondary | ICD-10-CM | POA: Diagnosis not present

## 2021-10-16 DIAGNOSIS — R55 Syncope and collapse: Secondary | ICD-10-CM | POA: Diagnosis not present

## 2021-10-16 DIAGNOSIS — Z4509 Encounter for adjustment and management of other cardiac device: Secondary | ICD-10-CM | POA: Diagnosis not present

## 2021-10-16 DIAGNOSIS — Z95818 Presence of other cardiac implants and grafts: Secondary | ICD-10-CM | POA: Diagnosis not present

## 2021-10-20 DIAGNOSIS — M545 Low back pain, unspecified: Secondary | ICD-10-CM | POA: Diagnosis not present

## 2021-10-22 DIAGNOSIS — M545 Low back pain, unspecified: Secondary | ICD-10-CM | POA: Diagnosis not present

## 2021-10-27 DIAGNOSIS — M545 Low back pain, unspecified: Secondary | ICD-10-CM | POA: Diagnosis not present

## 2021-10-28 DIAGNOSIS — E039 Hypothyroidism, unspecified: Secondary | ICD-10-CM | POA: Diagnosis not present

## 2021-10-28 DIAGNOSIS — R300 Dysuria: Secondary | ICD-10-CM | POA: Diagnosis not present

## 2021-10-28 DIAGNOSIS — D508 Other iron deficiency anemias: Secondary | ICD-10-CM | POA: Diagnosis not present

## 2021-10-28 DIAGNOSIS — R7303 Prediabetes: Secondary | ICD-10-CM | POA: Diagnosis not present

## 2021-10-28 DIAGNOSIS — M810 Age-related osteoporosis without current pathological fracture: Secondary | ICD-10-CM | POA: Diagnosis not present

## 2021-10-30 ENCOUNTER — Other Ambulatory Visit: Payer: Self-pay | Admitting: Student

## 2021-11-07 DIAGNOSIS — R2689 Other abnormalities of gait and mobility: Secondary | ICD-10-CM | POA: Diagnosis not present

## 2021-11-07 DIAGNOSIS — D508 Other iron deficiency anemias: Secondary | ICD-10-CM | POA: Diagnosis not present

## 2021-11-07 DIAGNOSIS — E559 Vitamin D deficiency, unspecified: Secondary | ICD-10-CM | POA: Diagnosis not present

## 2021-11-07 DIAGNOSIS — I480 Paroxysmal atrial fibrillation: Secondary | ICD-10-CM | POA: Diagnosis not present

## 2021-11-07 DIAGNOSIS — F325 Major depressive disorder, single episode, in full remission: Secondary | ICD-10-CM | POA: Diagnosis not present

## 2021-11-07 DIAGNOSIS — R7303 Prediabetes: Secondary | ICD-10-CM | POA: Diagnosis not present

## 2021-11-07 DIAGNOSIS — E039 Hypothyroidism, unspecified: Secondary | ICD-10-CM | POA: Diagnosis not present

## 2021-11-07 DIAGNOSIS — I10 Essential (primary) hypertension: Secondary | ICD-10-CM | POA: Diagnosis not present

## 2021-11-07 DIAGNOSIS — R296 Repeated falls: Secondary | ICD-10-CM | POA: Diagnosis not present

## 2021-11-11 DIAGNOSIS — M545 Low back pain, unspecified: Secondary | ICD-10-CM | POA: Diagnosis not present

## 2021-11-16 DIAGNOSIS — Z95818 Presence of other cardiac implants and grafts: Secondary | ICD-10-CM | POA: Diagnosis not present

## 2021-11-16 DIAGNOSIS — Z4509 Encounter for adjustment and management of other cardiac device: Secondary | ICD-10-CM | POA: Diagnosis not present

## 2021-11-16 DIAGNOSIS — R55 Syncope and collapse: Secondary | ICD-10-CM | POA: Diagnosis not present

## 2021-11-17 DIAGNOSIS — Z08 Encounter for follow-up examination after completed treatment for malignant neoplasm: Secondary | ICD-10-CM | POA: Diagnosis not present

## 2021-11-17 DIAGNOSIS — D225 Melanocytic nevi of trunk: Secondary | ICD-10-CM | POA: Diagnosis not present

## 2021-11-17 DIAGNOSIS — L820 Inflamed seborrheic keratosis: Secondary | ICD-10-CM | POA: Diagnosis not present

## 2021-11-17 DIAGNOSIS — L814 Other melanin hyperpigmentation: Secondary | ICD-10-CM | POA: Diagnosis not present

## 2021-11-17 DIAGNOSIS — L821 Other seborrheic keratosis: Secondary | ICD-10-CM | POA: Diagnosis not present

## 2021-11-17 DIAGNOSIS — Z86006 Personal history of melanoma in-situ: Secondary | ICD-10-CM | POA: Diagnosis not present

## 2021-11-17 DIAGNOSIS — L570 Actinic keratosis: Secondary | ICD-10-CM | POA: Diagnosis not present

## 2021-11-19 ENCOUNTER — Encounter (HOSPITAL_BASED_OUTPATIENT_CLINIC_OR_DEPARTMENT_OTHER): Payer: Self-pay | Admitting: Emergency Medicine

## 2021-11-19 ENCOUNTER — Emergency Department (HOSPITAL_BASED_OUTPATIENT_CLINIC_OR_DEPARTMENT_OTHER)
Admission: EM | Admit: 2021-11-19 | Discharge: 2021-11-19 | Disposition: A | Discharge status: Home / Self Care | Payer: Medicare PPO | Attending: Emergency Medicine | Admitting: Emergency Medicine

## 2021-11-19 ENCOUNTER — Emergency Department (HOSPITAL_BASED_OUTPATIENT_CLINIC_OR_DEPARTMENT_OTHER): Payer: Medicare PPO

## 2021-11-19 ENCOUNTER — Other Ambulatory Visit: Payer: Self-pay

## 2021-11-19 DIAGNOSIS — Z96651 Presence of right artificial knee joint: Secondary | ICD-10-CM | POA: Diagnosis not present

## 2021-11-19 DIAGNOSIS — Z7901 Long term (current) use of anticoagulants: Secondary | ICD-10-CM | POA: Insufficient documentation

## 2021-11-19 DIAGNOSIS — M4319 Spondylolisthesis, multiple sites in spine: Secondary | ICD-10-CM | POA: Diagnosis not present

## 2021-11-19 DIAGNOSIS — G319 Degenerative disease of nervous system, unspecified: Secondary | ICD-10-CM | POA: Diagnosis not present

## 2021-11-19 DIAGNOSIS — W19XXXA Unspecified fall, initial encounter: Secondary | ICD-10-CM

## 2021-11-19 DIAGNOSIS — J323 Chronic sphenoidal sinusitis: Secondary | ICD-10-CM | POA: Diagnosis not present

## 2021-11-19 DIAGNOSIS — S0083XA Contusion of other part of head, initial encounter: Secondary | ICD-10-CM | POA: Insufficient documentation

## 2021-11-19 DIAGNOSIS — M47816 Spondylosis without myelopathy or radiculopathy, lumbar region: Secondary | ICD-10-CM | POA: Diagnosis not present

## 2021-11-19 DIAGNOSIS — I10 Essential (primary) hypertension: Secondary | ICD-10-CM | POA: Insufficient documentation

## 2021-11-19 DIAGNOSIS — M542 Cervicalgia: Secondary | ICD-10-CM | POA: Insufficient documentation

## 2021-11-19 DIAGNOSIS — E039 Hypothyroidism, unspecified: Secondary | ICD-10-CM | POA: Diagnosis not present

## 2021-11-19 DIAGNOSIS — M47812 Spondylosis without myelopathy or radiculopathy, cervical region: Secondary | ICD-10-CM | POA: Diagnosis not present

## 2021-11-19 DIAGNOSIS — W01198A Fall on same level from slipping, tripping and stumbling with subsequent striking against other object, initial encounter: Secondary | ICD-10-CM | POA: Insufficient documentation

## 2021-11-19 DIAGNOSIS — S0990XA Unspecified injury of head, initial encounter: Secondary | ICD-10-CM | POA: Diagnosis not present

## 2021-11-19 DIAGNOSIS — Z9889 Other specified postprocedural states: Secondary | ICD-10-CM | POA: Diagnosis not present

## 2021-11-19 DIAGNOSIS — S3993XA Unspecified injury of pelvis, initial encounter: Secondary | ICD-10-CM | POA: Diagnosis not present

## 2021-11-19 DIAGNOSIS — M25551 Pain in right hip: Secondary | ICD-10-CM | POA: Insufficient documentation

## 2021-11-19 DIAGNOSIS — S79911A Unspecified injury of right hip, initial encounter: Secondary | ICD-10-CM | POA: Diagnosis not present

## 2021-11-19 DIAGNOSIS — S8991XA Unspecified injury of right lower leg, initial encounter: Secondary | ICD-10-CM | POA: Diagnosis not present

## 2021-11-19 NOTE — ED Triage Notes (Signed)
Patient states yesterday she was attempting to pull weeds and she fell forward and hit her head on the patio. C/o head pain and neck stiffness since yesterday and reports dizziness.

## 2021-11-19 NOTE — ED Notes (Signed)
Pt ambulated with walker approx 20 yards. Pt had steady gait and denied any dizziness

## 2021-11-19 NOTE — ED Provider Notes (Signed)
[facility] EMERGENCY DEPARTMENT Provider Note   CSN: [id number] Arrival date & time: 11/19/21  1446     History  Chief Complaint  Patient presents with   Cynthia [name]    Cynthia [name] is a 81 y.o. female.  Patient with a history of hypertension, atrial fibrillation on "Eliquis equivalent", hypothyroidism presenting with head injury.  States she was pulling weeds yesterday when she lost her balance and struck her forehead on a glass table with a metal rim.  Table did not shatter.  She did not lose consciousness.  Has had a headache since with some lightheadedness and nausea but no vomiting.  No chest pain or shortness of breath.  Complains of pain to right hip and mid buttock as well.  Believes she landed on her right hip after she hit her head.  Denies any focal weakness, numbness or tingling.  Does have some soreness in her neck.  Does feel dizzy and lightheaded but has chronic issues with this.  No visual changes.  No vomiting.  No abdominal pain, chest pain or shortness of breath. She is able to walk with her walker at baseline.  The history is provided by the patient and the spouse.  Fall Associated symptoms include headaches.       Home Medications Prior to Admission medications   Medication Sig Start Date End Date Taking? Authorizing Provider  acetaminophen (TYLENOL) 500 MG tablet Take 1,000 mg by mouth at bedtime.    [provider]  Alpha-Lipoic Acid (LIPOIC ACID PO) Take 600 mg by mouth daily.    [provider]  amiodarone (PACERONE) 200 MG tablet Take 1 tablet (200 mg total) by mouth daily. 09/10/21   Cantwell, Celeste C, PA-C  atorvastatin (LIPITOR) 10 MG tablet TAKE 1 TABLET BY MOUTH EVERY DAY 03/10/21   Cantwell, Celeste C, PA-C  AZO-CRANBERRY PO Take 1 tablet by mouth daily.    [provider]  Calcium Carbonate-Vitamin D3 600-400 MG-UNIT TABS Take 1 tablet by mouth daily.    [provider]  cycloSPORINE (RESTASIS) 0.05 %  ophthalmic emulsion Place 1 drop into both eyes daily.    [provider]  denosumab (PROLIA) 60 MG/ML SOSY injection Inject 60 mg into the skin every 6 (six) months.    [provider]  desmopressin (DDAVP) 0.2 MG tablet Take 3 tablets by mouth as needed.    [provider]  diltiazem (CARDIZEM CD) 120 MG 24 hr capsule Take 1 capsule (120 mg total) by mouth daily. 07/02/21   Cantwell, Celeste C, PA-C  fluticasone (FLONASE) 50 MCG/ACT nasal spray Place 1 spray into the nose at bedtime.    [provider]  losartan (COZAAR) 100 MG tablet Take 100 mg by mouth every evening. 04/15/20   [provider]  meclizine (ANTIVERT) 25 MG tablet Take 25 mg by mouth daily as needed for dizziness.    [provider]  Melatonin 5 MG CAPS Take 5 mg by mouth at bedtime.     [provider]  methocarbamol (ROBAXIN) 500 MG tablet Take 1 tablet (500 mg total) by mouth every 6 (six) hours as needed for muscle spasms. 06/08/21   Drue Novel, PA  metoprolol succinate (TOPROL-XL) 25 MG 24 hr tablet Take 25 mg by mouth daily. 07/23/21   [provider]  Multiple Vitamin (MULTIVITAMIN WITH MINERALS) TABS tablet Take 1-2 tablets by mouth daily. flintstone vitamins Chewable with Iron    [provider]  multivitamin-iron-minerals-folic acid (CENTRUM)  chewable tablet Chew 1 tablet by mouth daily.    [provider]  nitroGLYCERIN (NITROLINGUAL) 0.4 MG/SPRAY spray Place 1 spray under the tongue every 5 (five) minutes x 3 doses as needed for chest pain. 03/19/21   Cantwell, Celeste C, PA-C  Omega-3 1000 MG CAPS Take 2,000 mg by mouth daily.     [provider]  ondansetron (ZOFRAN) 4 MG tablet Take 4 mg by mouth every 8 (eight) hours as needed for nausea or vomiting.    [provider]  pantoprazole (PROTONIX) 40 MG tablet Take 40 mg by mouth daily before breakfast.  06/12/14   [provider]  sertraline (ZOLOFT) 25 MG  tablet Take 25 mg by mouth daily.    [provider]  thyroid (ARMOUR) 60 MG tablet Take 60 mg by mouth daily before breakfast. 11/07/17   [provider]  traMADol (ULTRAM) 50 MG tablet Take 1 tablet (50 mg total) by mouth every 6 (six) hours as needed for severe pain. 06/08/21   Drue Novel, PA  vitamin C (ASCORBIC ACID) 500 MG tablet Take 500 mg by mouth daily.    [provider]      Allergies    Flecainide, Oxycodone, Trimethoprim, Alendronate sodium, Avelox [moxifloxacin hcl in nacl], Ciprofloxacin, and Other    Review of Systems   Review of Systems  Neurological:  Positive for dizziness, light-headedness and headaches. Negative for weakness.   all other systems are negative except as noted in the HPI and PMH.    Physical Exam Updated Vital Signs BP 131/80 (BP Location: Left Arm)   Pulse 70   Temp 98 F (36.7 C) (Oral)   Resp 18   Ht '5\' 5"'$  (1.651 m)   Wt 68 kg   SpO2 94%   BMI 24.96 kg/m  Physical Exam Vitals and nursing note reviewed.  Constitutional:      General: She is not in acute distress.    Appearance: She is well-developed.  HENT:     Head: Normocephalic.     Comments: Small hematoma left forehead, no overlying abrasion.    Mouth/Throat:     Pharynx: No oropharyngeal exudate.  Eyes:     Conjunctiva/sclera: Conjunctivae normal.     Pupils: Pupils are equal, round, and reactive to light.  Neck:     Comments: Diffuse paraspinal C-spine tenderness, no midline tenderness Cardiovascular:     Rate and Rhythm: Normal rate and regular rhythm.     Heart sounds: Normal heart sounds. No murmur heard. Pulmonary:     Effort: Pulmonary effort is normal. No respiratory distress.     Breath sounds: Normal breath sounds.  Abdominal:     Palpations: Abdomen is soft.     Tenderness: There is no abdominal tenderness. There is no guarding or rebound.  Musculoskeletal:        General: Tenderness present. Normal range of motion.     Cervical  back: Normal range of motion and neck supple.     Comments:  Pain with range of motion of right hip but able to range.  No shortening or external rotation.  Pelvis stable.  Skin:    General: Skin is warm.  Neurological:     Mental Status: She is alert and oriented to person, place, and time.     Cranial Nerves: No cranial nerve deficit.     Motor: No abnormal muscle tone.     Coordination: Coordination normal.     Comments:  5/5 strength throughout.  CN 2-12 intact.Equal grip strength.   Psychiatric:        Behavior: Behavior normal.     ED Results / Procedures / Treatments   Labs (all labs ordered are listed, but only abnormal results are displayed) Labs Reviewed - No data to display  EKG EKG Interpretation  Date/Time:  Wednesday November 19 2021 17:18:17 EDT Ventricular Rate:  66 PR Interval:  179 QRS Duration: 111 QT Interval:  496 QTC Calculation: 520 R Axis:   -60 Text Interpretation: Sinus rhythm Left anterior fascicular block Prolonged QT interval No significant change was found Confirmed by Ezequiel Essex [phone]) on 11/19/2021 5:19:43 PM  Radiology CT PELVIS WO CONTRAST  Result Date: 11/19/2021 CLINICAL DATA:  Hip trauma, fracture suspected. EXAM: CT PELVIS WITHOUT CONTRAST TECHNIQUE: Multidetector CT imaging of the pelvis was performed following the standard protocol without intravenous contrast. RADIATION DOSE REDUCTION: This exam was performed according to the departmental dose-optimization program which includes automated exposure control, adjustment of the mA and/or kV according to patient size and/or use of iterative reconstruction technique. COMPARISON:  Right hip MRI 03/22/2021.  Pelvic CT 11/06/2017. FINDINGS: Bones/Joint/Cartilage No evidence of acute fracture or dislocation. The hip joint spaces are preserved. No significant hip joint effusion. Mild sacroiliac degenerative changes bilaterally. There are degenerative changes in the lower lumbar spine with facet  mediated grade 1 anterolisthesis at L4-5 and L5-S1. There is some associated foraminal narrowing at those levels. Ligaments Ligaments are suboptimally evaluated by CT. Muscles and Tendons No acute or focal muscular abnormalities are identified. There is chronic fatty atrophy of the erector spinae musculature and gluteus musculature bilaterally. Chronic hamstring tendinosis bilaterally. Soft Tissues No evidence of periarticular fluid collection or foreign body. Moderate stool throughout the colon. Aortoiliac atherosclerosis. IMPRESSION: 1. No evidence of acute osseous abnormality in the pelvis. No evidence of hip fracture or significant arthropathy. 2. Lower lumbar spondylosis with grade 1 anterolisthesis and biforaminal narrowing at L4-5 and L5-S1. 3. Chronic pelvic muscular atrophy. No evidence of periarticular hematoma. Electronically Signed   By: Richardean Sale M.D.   On: 11/19/2021 17:22   CT Cervical Spine Wo Contrast  Result Date: 11/19/2021 CLINICAL DATA:  [place] EXAM: CT CERVICAL SPINE WITHOUT CONTRAST TECHNIQUE: Multidetector CT imaging of the cervical spine was performed without intravenous contrast. Multiplanar CT image reconstructions were also generated. RADIATION DOSE REDUCTION: This exam was performed according to the departmental dose-optimization program which includes automated exposure control, adjustment of the mA and/or kV according to patient size and/or use of iterative reconstruction technique. COMPARISON:  01/24/2020 FINDINGS: Alignment: Alignment is grossly anatomic. Skull base and vertebrae: No acute fracture. No primary bone lesion or focal pathologic process. Stable postsurgical changes from partial left mastoidectomy and left occipital craniectomy. Soft tissues and spinal canal: No prevertebral fluid or swelling. No visible canal hematoma. Disc levels: There is diffuse cervical spondylosis and facet hypertrophy extending from C2 through C7. Bony fusion across the left C3-4 facet  joints. Upper chest: Airway is patent.  Lung apices are clear. Other: Reconstructed images demonstrate no additional findings. IMPRESSION: 1. No acute cervical spine fracture. 2. Extensive multilevel spondylosis and facet hypertrophy as above. Electronically Signed   By: Randa Ngo M.D.   On: 11/19/2021 17:18   CT Head Wo Contrast  Result Date: 11/19/2021 CLINICAL DATA:  [place], head trauma EXAM: CT HEAD WITHOUT CONTRAST TECHNIQUE: Contiguous axial images were obtained from the base of the skull through the vertex without intravenous contrast. RADIATION DOSE REDUCTION: This exam was performed according  to the departmental dose-optimization program which includes automated exposure control, adjustment of the mA and/or kV according to patient size and/or use of iterative reconstruction technique. COMPARISON:  06/14/2020 FINDINGS: Brain: No acute infarct or hemorrhage. Stable cerebral atrophy. Lateral ventricles and midline structures are unremarkable. No acute extra-axial fluid collections. No mass effect. Vascular: No hyperdense vessel or unexpected calcification. Skull: Postsurgical changes from prior left mastoidectomy and left occipital craniectomy. No acute bony abnormalities. Sinuses/Orbits: Continued changes of chronic right sphenoid sinus disease. No acute gas fluid levels. Other: None. IMPRESSION: 1. No acute intracranial process.  Stable postsurgical changes. Electronically Signed   By: Randa Ngo M.D.   On: 11/19/2021 17:17   DG Knee Complete 4 Views Right  Result Date: 11/19/2021 CLINICAL DATA:  Fall with right knee injury. History of right knee arthroplasty earlier this year. EXAM: RIGHT KNEE - COMPLETE 4+ VIEW COMPARISON:  Right knee radiographs 06/05/2021 FINDINGS: Sequelae of total knee arthroplasty are again identified. No periprosthetic lucency is seen to suggest loosening or infection. No acute fracture, dislocation, or sizable knee joint effusion is evident. Soft tissue swelling is  noted about the anterior and medial aspects of the knee. IMPRESSION: No acute osseous abnormality identified. Electronically Signed   By: Logan Bores M.D.   On: 11/19/2021 17:03    Procedures Procedures    Medications Ordered in ED Medications - No data to display  ED Course/ Medical Decision Making/ A&P                           Medical Decision Making Amount and/or Complexity of Data Reviewed Labs: ordered. Decision-making details documented in ED Course. Radiology: ordered and independent interpretation performed. Decision-making details documented in ED Course. ECG/medicine tests: ordered and independent interpretation performed. Decision-making details documented in ED Course.  Head injury with reported blood thinner use at home.  No loss of consciousness.  Does feel lightheaded with nausea.  Neurologically intact  Given her anticoagulation use CT imaging is pursued.  This is negative for acute traumatic pathology.  Results reviewed and interpreted by me.  Does have extensive spondylosis throughout C-spine and L-spine but no fracture.  No hemorrhage.  Patient able to ambulate with assistance.  Denies any dizziness or lightheadedness.  States she is feeling improved.  No chest pain or shortness of breath  Closed head injury with possible concussion.  Imaging is reassuring.  Discussed supportive care at home and PCP follow-up.  Return to the ED with worsening headache, confusion, behavior change, vomiting, or other concerns.        Final Clinical Impression(s) / ED Diagnoses Final diagnoses:  Fall, initial encounter  Injury of head, initial encounter    Rx / DC Orders ED Discharge Orders     None         Dia Jefferys, Annie Main, MD 11/19/21 1750

## 2021-11-19 NOTE — Discharge Instructions (Signed)
Your CT scan is reassuring.  Use Tylenol or Motrin as needed for headache.  Follow-up with your doctor for recheck this week.  Return to the ED with worsening headache, chest pain, shortness of breath, nausea, vomiting, difficulty breathing or any other concerns

## 2021-11-19 NOTE — ED Notes (Signed)
ED Provider at bedside. 

## 2021-11-19 NOTE — ED Notes (Signed)
Patient transported to X-ray 

## 2021-11-21 DIAGNOSIS — M545 Low back pain, unspecified: Secondary | ICD-10-CM | POA: Diagnosis not present

## 2021-12-02 DIAGNOSIS — R296 Repeated falls: Secondary | ICD-10-CM | POA: Diagnosis not present

## 2021-12-02 DIAGNOSIS — R2681 Unsteadiness on feet: Secondary | ICD-10-CM | POA: Diagnosis not present

## 2021-12-02 DIAGNOSIS — Z09 Encounter for follow-up examination after completed treatment for conditions other than malignant neoplasm: Secondary | ICD-10-CM | POA: Diagnosis not present

## 2021-12-02 DIAGNOSIS — R2689 Other abnormalities of gait and mobility: Secondary | ICD-10-CM | POA: Diagnosis not present

## 2021-12-05 ENCOUNTER — Other Ambulatory Visit: Payer: Self-pay | Admitting: Cardiology

## 2021-12-12 ENCOUNTER — Other Ambulatory Visit: Payer: Self-pay | Admitting: Cardiology

## 2021-12-17 DIAGNOSIS — Z95818 Presence of other cardiac implants and grafts: Secondary | ICD-10-CM | POA: Diagnosis not present

## 2021-12-17 DIAGNOSIS — Z4509 Encounter for adjustment and management of other cardiac device: Secondary | ICD-10-CM | POA: Diagnosis not present

## 2021-12-17 DIAGNOSIS — R55 Syncope and collapse: Secondary | ICD-10-CM | POA: Diagnosis not present

## 2021-12-17 DIAGNOSIS — R2689 Other abnormalities of gait and mobility: Secondary | ICD-10-CM | POA: Diagnosis not present

## 2021-12-22 DIAGNOSIS — R2689 Other abnormalities of gait and mobility: Secondary | ICD-10-CM | POA: Diagnosis not present

## 2021-12-25 DIAGNOSIS — R2689 Other abnormalities of gait and mobility: Secondary | ICD-10-CM | POA: Diagnosis not present

## 2021-12-29 ENCOUNTER — Encounter: Payer: Self-pay | Admitting: Pulmonary Disease

## 2021-12-29 ENCOUNTER — Ambulatory Visit (INDEPENDENT_AMBULATORY_CARE_PROVIDER_SITE_OTHER): Payer: Medicare PPO | Admitting: Pulmonary Disease

## 2021-12-29 VITALS — BP 110/70 | HR 58 | Temp 98.1°F | Ht 65.0 in | Wt 149.4 lb

## 2021-12-29 DIAGNOSIS — G4733 Obstructive sleep apnea (adult) (pediatric): Secondary | ICD-10-CM

## 2021-12-29 NOTE — Progress Notes (Signed)
Cynthia [name]    [id number]    [birth year]  Primary Care Physician:Pharr, Thayer Jew, MD  Referring Physician: Deland Pretty, MD [address] [place],  [place] [address]  Chief complaint:   Patient being seen for obstructive sleep apnea  had a recent sleep study   HPI:  Recent study shows an AHI of 60  She does have a current machine that she feels is not working well  Did not receive a new machine Did contact DME company and we were told that she does not qualify for new machine until 2024 Patient stated that herself and spouse who just received a new machine did receive the previous machine about the same time  Still tired and sleepy during the day  Usually goes to bed about 1030, falls asleep in 20 to 30 minutes Multiple awakenings-about 2-3 Final wake up time about 7 30-8 30 She does take a nap during the day the last about 2 hours, she feels rejuvenated from her naps  Final wake up time about 8:30 in the morning  Background history of hypertension, angina, atrial fibrillation, hypercholesterolemia, melanoma   Outpatient Encounter Medications as of 12/29/2021  Medication Sig   acetaminophen (TYLENOL) 500 MG tablet Take 1,000 mg by mouth at bedtime.   Alpha-Lipoic Acid (LIPOIC ACID PO) Take 600 mg by mouth daily.   amiodarone (PACERONE) 200 MG tablet Take 1 tablet (200 mg total) by mouth daily.   atorvastatin (LIPITOR) 10 MG tablet TAKE 1 TABLET BY MOUTH EVERY DAY   AZO-CRANBERRY PO Take 1 tablet by mouth daily.   Calcium Carbonate-Vitamin D3 600-400 MG-UNIT TABS Take 1 tablet by mouth daily.   cycloSPORINE (RESTASIS) 0.05 % ophthalmic emulsion Place 1 drop into both eyes daily.   denosumab (PROLIA) 60 MG/ML SOSY injection Inject 60 mg into the skin every 6 (six) months.   desmopressin (DDAVP) 0.2 MG tablet Take 3 tablets by mouth as needed.   diltiazem (CARDIZEM CD) 120 MG 24 hr capsule Take 1 capsule (120 mg total) by mouth daily.    fluticasone (FLONASE) 50 MCG/ACT nasal spray Place 1 spray into the nose at bedtime.   losartan (COZAAR) 100 MG tablet Take 100 mg by mouth every evening.   meclizine (ANTIVERT) 25 MG tablet Take 25 mg by mouth daily as needed for dizziness.   Melatonin 5 MG CAPS Take 5 mg by mouth at bedtime.    metoprolol succinate (TOPROL-XL) 25 MG 24 hr tablet Take 25 mg by mouth daily.   Multiple Vitamin (MULTIVITAMIN WITH MINERALS) TABS tablet Take 1-2 tablets by mouth daily. flintstone vitamins Chewable with Iron   multivitamin-iron-minerals-folic acid (CENTRUM) chewable tablet Chew 1 tablet by mouth daily.   nitroGLYCERIN (NITROLINGUAL) 0.4 MG/SPRAY spray Place 1 spray under the tongue every 5 (five) minutes x 3 doses as needed for chest pain.   Omega-3 1000 MG CAPS Take 2,000 mg by mouth daily.    ondansetron (ZOFRAN) 4 MG tablet Take 4 mg by mouth every 8 (eight) hours as needed for nausea or vomiting.   pantoprazole (PROTONIX) 40 MG tablet Take 40 mg by mouth daily before breakfast.    sertraline (ZOLOFT) 25 MG tablet Take 25 mg by mouth daily.   thyroid (ARMOUR) 60 MG tablet Take 60 mg by mouth daily before breakfast.   traMADol (ULTRAM) 50 MG tablet Take 1 tablet (50 mg total) by mouth every 6 (six) hours as needed for severe pain.   vitamin  C (ASCORBIC ACID) 500 MG tablet Take 500 mg by mouth daily.   [DISCONTINUED] methocarbamol (ROBAXIN) 500 MG tablet Take 1 tablet (500 mg total) by mouth every 6 (six) hours as needed for muscle spasms. (Patient not taking: Reported on 12/29/2021)   No facility-administered encounter medications on file as of 12/29/2021.    Allergies as of 12/29/2021 - Review Complete 12/29/2021  Allergen Reaction Noted   Flecainide Other (See Comments) 05/23/2020   Oxycodone Other (See Comments) 06/03/2020   Trimethoprim Other (See Comments) 10/24/2020   Alendronate sodium Nausea Only 04/25/2018   Avelox [moxifloxacin hcl in nacl] Other (See Comments) 06/01/2018    Ciprofloxacin Other (See Comments) 12/23/2020   Other Other (See Comments) 01/24/2020    Past Medical History:  Diagnosis Date   Acute GI bleeding 10/27/2017   Arthritis    "joints" (11/10/2017)   Dizziness    Dysrhythmia    afib   Encounter for loop recorder check 06/09/2020   GERD (gastroesophageal reflux disease)    GI bleed 11/06/2017   Hearing loss    History of blood transfusion 10/2017   Hyperlipidemia    Hypertension    Hyperthyroidism    Loop recorder Biotronik Loop Recorder 05/23/2020 05/23/2020   Melanoma ([place])    "cut off my back"   Memory loss    Pneumonia    Sleep apnea    cpap   Syncope and collapse     Past Surgical History:  Procedure Laterality Date   ABDOMINAL HYSTERECTOMY     "partial"   APPENDECTOMY     BIOPSY  11/17/2017   Procedure: BIOPSY;  Surgeon: Clarene Essex, MD;  Location: [facility];  Service: Endoscopy;;   COLONOSCOPY WITH PROPOFOL N/A 11/17/2017   Procedure: COLONOSCOPY WITH PROPOFOL;  Surgeon: Clarene Essex, MD;  Location: [facility];  Service: Endoscopy;  Laterality: N/A;   CRANIECTOMY FOR EXCISION OF ACOUSTIC NEUROMA     ESOPHAGOGASTRODUODENOSCOPY (EGD) WITH PROPOFOL N/A 10/28/2017   Procedure: ESOPHAGOGASTRODUODENOSCOPY (EGD) WITH PROPOFOL;  Surgeon: Clarene Essex, MD;  Location: [facility];  Service: Endoscopy;  Laterality: N/A;   FOOT SURGERY Bilateral 04/18/2020   KNEE ARTHROPLASTY Right 06/05/2021   Procedure: COMPUTER ASSISTED TOTAL KNEE ARTHROPLASTY;  Surgeon: Rod Can, MD;  Location: [facility];  Service: Orthopedics;  Laterality: Right;   KNEE ARTHROSCOPY Left    LEFT HEART CATH AND CORONARY ANGIOGRAPHY N/A 11/24/2016   Procedure: LEFT HEART CATH AND CORONARY ANGIOGRAPHY;  Surgeon: Jettie Booze, MD;  Location: [facility];  Service: Cardiovascular;  Laterality: N/A;   MELANOMA EXCISION     "off my back"   NASAL SINUS SURGERY     TONSILLECTOMY      Family History  Problem Relation Age of Onset   Congestive  Heart Failure Mother    Heart disease Father        History of heart attacks at a later age.     Heart disease Brother    Hearing loss Brother     Social History   Socioeconomic History   Marital status: Married    Spouse name: Not on file   Number of children: 2   Years of education: Not on file   Highest education level: Not on file  Occupational History   Not on file  Tobacco Use   Smoking status: Former    Years: 2.00    Types: Cigarettes    Quit date: 1980    Years since quitting: 43.7   Smokeless tobacco: Never  Tobacco comments:    11/10/2017 "only smoked when I was on my period"  Vaping Use   Vaping Use: Never used  Substance and Sexual Activity   Alcohol use: Yes    Comment: wine on occas   Drug use: Never   Sexual activity: Not Currently  Other Topics Concern   Not on file  Social History Narrative   Right handed    Social Determinants of Health   Financial Resource Strain: Not on file  Food Insecurity: Not on file  Transportation Needs: Not on file  Physical Activity: Not on file  Stress: Not on file  Social Connections: Not on file  Intimate Partner Violence: Not on file    Review of Systems  Respiratory:  Positive for apnea.   Psychiatric/Behavioral:  Positive for sleep disturbance.     Vitals:   12/29/21 1110  BP: 110/70  Pulse: (!) 58  Temp: 98.1 F (36.7 C)  SpO2: 96%     Physical Exam Constitutional:      Appearance: Normal appearance.  HENT:     Head: Normocephalic.     Mouth/Throat:     Mouth: Mucous membranes are moist.  Eyes:     Pupils: Pupils are equal, round, and reactive to light.  Cardiovascular:     Rate and Rhythm: Normal rate and regular rhythm.     Heart sounds: No murmur heard.    No friction rub.  Pulmonary:     Effort: No respiratory distress.     Breath sounds: No stridor. No wheezing or rhonchi.  Musculoskeletal:     Cervical back: No rigidity or tenderness.  Neurological:     Mental Status: She is  alert.  Psychiatric:        Mood and Affect: Mood normal.      07/23/2021   11:00 AM  Results of the Epworth flowsheet  Sitting and reading 2  Watching TV 0  Sitting, inactive in a public place (e.g. a theatre or a meeting) 1  As a passenger in a car for an hour without a break 2  Lying down to rest in the afternoon when circumstances permit 2  Sitting and talking to someone 1  Sitting quietly after a lunch without alcohol 2  In a car, while stopped for a few minutes in traffic 0  Total score 10   Data Reviewed: Most recent sleep study from 2023 shows severe obstructive sleep apnea with AHI of 60  Assessment:  Severe obstructive sleep apnea -Has not been compliant with CPAP -Feels current machine is not working well  Daytime sleepiness likely related to untreated obstructive sleep apnea  History of hypertension Angina Atrial fibrillation -Compliant with medications  Sinus congestion and fullness Cough -Encouraged to give [place] a call in a few days if she still not feeling any better -Likely related to a viral URI  Plan/Recommendations: Contact DME[facility]  [facility] states will troubleshoot machine  Follow-up in 3 months  Encouraged to give [place] a call with any significant concerns  The importance of treating sleep disordered breathing discussed with the patient   Sherrilyn Rist MD [facility] 12/29/2021, 11:12 AM  CC: Deland Pretty, MD

## 2021-12-29 NOTE — Patient Instructions (Signed)
[facility] will be giving you a call regarding troubleshooting the machine, if you do not hear from them.  Please follow-up to make sure the machine is looked into to make sure it is working  Call us if you are still having significant issues with your sinus congestion for possible antibiotic treatment  Graded exercises as tolerated  I will see you in about 3 months

## 2021-12-31 DIAGNOSIS — R2689 Other abnormalities of gait and mobility: Secondary | ICD-10-CM | POA: Diagnosis not present

## 2022-01-02 DIAGNOSIS — R2689 Other abnormalities of gait and mobility: Secondary | ICD-10-CM | POA: Diagnosis not present

## 2022-01-07 DIAGNOSIS — N390 Urinary tract infection, site not specified: Secondary | ICD-10-CM | POA: Diagnosis not present

## 2022-01-09 DIAGNOSIS — R2689 Other abnormalities of gait and mobility: Secondary | ICD-10-CM | POA: Diagnosis not present

## 2022-01-16 DIAGNOSIS — R2689 Other abnormalities of gait and mobility: Secondary | ICD-10-CM | POA: Diagnosis not present

## 2022-01-17 DIAGNOSIS — Z95818 Presence of other cardiac implants and grafts: Secondary | ICD-10-CM | POA: Diagnosis not present

## 2022-01-17 DIAGNOSIS — R55 Syncope and collapse: Secondary | ICD-10-CM | POA: Diagnosis not present

## 2022-01-17 DIAGNOSIS — Z4509 Encounter for adjustment and management of other cardiac device: Secondary | ICD-10-CM | POA: Diagnosis not present

## 2022-01-29 ENCOUNTER — Telehealth: Payer: Self-pay | Admitting: Cardiology

## 2022-01-29 NOTE — Telephone Encounter (Signed)
[facility]-AF trial medication added to medication list.

## 2022-02-05 DIAGNOSIS — Z23 Encounter for immunization: Secondary | ICD-10-CM | POA: Diagnosis not present

## 2022-02-17 DIAGNOSIS — Z95818 Presence of other cardiac implants and grafts: Secondary | ICD-10-CM | POA: Diagnosis not present

## 2022-02-17 DIAGNOSIS — R55 Syncope and collapse: Secondary | ICD-10-CM | POA: Diagnosis not present

## 2022-02-17 DIAGNOSIS — Z4509 Encounter for adjustment and management of other cardiac device: Secondary | ICD-10-CM | POA: Diagnosis not present

## 2022-02-26 DIAGNOSIS — H2513 Age-related nuclear cataract, bilateral: Secondary | ICD-10-CM | POA: Diagnosis not present

## 2022-02-26 DIAGNOSIS — H5319 Other subjective visual disturbances: Secondary | ICD-10-CM | POA: Diagnosis not present

## 2022-03-03 ENCOUNTER — Ambulatory Visit: Payer: Medicare PPO | Admitting: Family Medicine

## 2022-03-04 ENCOUNTER — Ambulatory Visit: Payer: Medicare PPO | Admitting: Student

## 2022-03-04 ENCOUNTER — Ambulatory Visit: Payer: Medicare PPO

## 2022-03-04 VITALS — BP 133/60 | HR 60 | Temp 97.6°F | Resp 16 | Ht 65.0 in | Wt 153.2 lb

## 2022-03-04 DIAGNOSIS — Z006 Encounter for examination for normal comparison and control in clinical research program: Secondary | ICD-10-CM

## 2022-03-04 DIAGNOSIS — I10 Essential (primary) hypertension: Secondary | ICD-10-CM | POA: Diagnosis not present

## 2022-03-04 DIAGNOSIS — I480 Paroxysmal atrial fibrillation: Secondary | ICD-10-CM

## 2022-03-04 DIAGNOSIS — E782 Mixed hyperlipidemia: Secondary | ICD-10-CM

## 2022-03-04 NOTE — Progress Notes (Signed)
Primary Physician/Referring:  Deland Pretty, MD  [name], female    DOB: April 20, 1940, 81 y.o.   MRN: [id number]  Chief Complaint  Patient presents with   Hypertension   Follow-up    6 months   HPI:    Cynthia [name]  is a 81 y.o. Caucasian female with hypertension, hyperlipidemia, frequent UTI,  paroxysmal atrial fibrillation, atrial tachycardia, obstructive sleep apnea on CPAP. Tolerating anticoagulation well. Flecainide discontinued due to dizziness. In the past she has had recurrent episodes of near syncope and severe orthostatic changes with recurrent UTI.  However patient with episodes of syncope and near syncope not associated with UTI.  Patient underwent surgery to her right foot 03/04/2020 and had an episode of syncope while at rehabilitation facility on 04/10/2020.   Recorder transmission 06/2021 revealed persistent atrial fibrillation with RVR, patient was therefore started on amiodarone.   She presents today for 37-monthfollow-up.  She was last seen in the office on 09/22/2021 with complaints of dizziness and medication compliance difficulty. She was enrolled in a [place] clinical trial and appears to have been taking double the prescribed amount of study drug. Dizziness was felt to be related to medication therefore no changes were made.  Overall, she is doing well without any complaints today.  She remains moderately active and walks a couple times a week.  She is maintaining a healthy diet. She denies chest pain, shortness of breath, palpitations, leg edema, orthopnea, PND, syncope.  Past Medical History:  Diagnosis Date   Acute GI bleeding 10/27/2017   Arthritis    "joints" (11/10/2017)   Dizziness    Dysrhythmia    afib   Encounter for loop recorder check 06/09/2020   GERD (gastroesophageal reflux disease)    GI bleed 11/06/2017   Hearing loss    History of blood transfusion 10/2017   Hyperlipidemia    Hypertension    Hyperthyroidism    Loop  recorder [employer] Loop Recorder 05/23/2020 05/23/2020   Melanoma ([facility]    "cut off my back"   Memory loss    Pneumonia    Sleep apnea    cpap   Syncope and collapse    Past Surgical History:  Procedure Laterality Date   ABDOMINAL HYSTERECTOMY     "partial"   APPENDECTOMY     BIOPSY  11/17/2017   Procedure: BIOPSY;  Surgeon: MClarene Essex MD;  Location: [facility];  Service: Endoscopy;;   COLONOSCOPY WITH PROPOFOL N/A 11/17/2017   Procedure: COLONOSCOPY WITH PROPOFOL;  Surgeon: MClarene Essex MD;  Location: [facility];  Service: Endoscopy;  Laterality: N/A;   CRANIECTOMY FOR EXCISION OF ACOUSTIC NEUROMA     ESOPHAGOGASTRODUODENOSCOPY (EGD) WITH PROPOFOL N/A 10/28/2017   Procedure: ESOPHAGOGASTRODUODENOSCOPY (EGD) WITH PROPOFOL;  Surgeon: MClarene Essex MD;  Location: [facility];  Service: Endoscopy;  Laterality: N/A;   FOOT SURGERY Bilateral 04/18/2020   KNEE ARTHROPLASTY Right 06/05/2021   Procedure: COMPUTER ASSISTED TOTAL KNEE ARTHROPLASTY;  Surgeon: SRod Can MD;  Location: [facility];  Service: Orthopedics;  Laterality: Right;   KNEE ARTHROSCOPY Left    LEFT HEART CATH AND CORONARY ANGIOGRAPHY N/A 11/24/2016   Procedure: LEFT HEART CATH AND CORONARY ANGIOGRAPHY;  Surgeon: VJettie Booze MD;  [facility];  Service: Cardiovascular;  Laterality: N/A;   MELANOMA EXCISION     "off my back"   NASAL SINUS SURGERY     TONSILLECTOMY     Family History  Problem Relation Age of Onset  Congestive Heart Failure Mother    Heart disease Father        History of heart attacks at a later age.     Heart disease Brother    Hearing loss Brother     Social History   Tobacco Use   Smoking status: Former    Years: 2.00    Types: Cigarettes    Quit date: 1980    Years since quitting: 43.9   Smokeless tobacco: Never   Tobacco comments:    11/10/2017 "only smoked when I was on my period"  Substance Use Topics   Alcohol use: Yes    Alcohol/week: 1.0 standard drink  of alcohol    Types: 1 Glasses of wine per week    Comment: wine on occassionally  Marital Status: Married   ROS  Review of Systems  Cardiovascular:  Negative for chest pain, claudication, dyspnea on exertion, leg swelling, near-syncope, orthopnea, palpitations, paroxysmal nocturnal dyspnea and syncope.  Respiratory:  Negative for shortness of breath.   Neurological:  Negative for dizziness.   Objective  Blood pressure 133/60, pulse 60, temperature 97.6 F (36.4 C), temperature source Temporal, resp. rate 16, height _0  (1.651 m), weight 153 lb 3.2 oz (69.5 kg), SpO2 97 %.     03/04/2022   10:18 AM 12/29/2021   11:10 AM 11/19/2021    5:25 PM  Vitals with BMI  Height _1  _2    Weight 153 lbs 3 oz 149 lbs 6 oz   BMI 12.75 17.00   Systolic 174 944   Diastolic 60 70   Pulse 60 58 68    Physical Exam Vitals reviewed.  Neck:     Vascular: No carotid bruit or JVD.  Cardiovascular:     Rate and Rhythm: Normal rate and regular rhythm.     Pulses: Intact distal pulses.     Heart sounds: Normal heart sounds, S1 normal and S2 normal. No murmur heard.    No gallop.  Pulmonary:     Effort: Pulmonary effort is normal. No respiratory distress.     Breath sounds: Normal breath sounds. No wheezing, rhonchi or rales.  Musculoskeletal:     Right lower leg: No edema.     Left lower leg: No edema.  Neurological:     Mental Status: She is alert.     Laboratory examination:   Recent Labs    06/02/21 0950 06/06/21 0323 07/26/21 1017  NA 134* 137 139  K 3.8 4.0 3.9  CL 100 108 106  CO2 _3 GLUCOSE 90 156* 109*  BUN 25* 24* 17  CREATININE 0.75 0.69 0.78  CALCIUM 9.1 8.6* 9.5  GFRNONAA >60 >60 >60   CrCl cannot be calculated (Patient's most recent lab result is older than the maximum 21 days allowed.).     Latest Ref Rng & Units 07/26/2021   10:17 AM 06/06/2021    3:23 AM 06/02/2021    9:50 AM  CMP  Glucose 70 - 99 mg/dL 109  156  90   BUN 8 - 23 mg/dL _4 Creatinine 0.44 - 1.00 mg/dL 0.78  0.69  0.75   Sodium 135 - 145 mmol/L 139  137  134   Potassium 3.5 - 5.1 mmol/L 3.9  4.0  3.8   Chloride 98 - 111 mmol/L 106  108  100   CO2 22 - 32 mmol/L _5 Calcium 8.9 - 10.3  mg/dL 9.5  8.6  9.1       Latest Ref Rng & Units 09/24/2021   11:49 AM 07/26/2021   10:17 AM 06/08/2021    3:43 AM  CBC  WBC 3.4 - 10.8 x10E3/uL 6.7  8.9  12.4   Hemoglobin 11.1 - 15.9 g/dL 12.0  11.5  8.9   Hematocrit 34.0 - 46.6 % 34.4  36.2  26.5   Platelets 150 - 450 x10E3/uL 271  192  141    Lipid Panel     Component Value Date/Time   CHOL 165 11/24/2016 0040   TRIG 88 11/24/2016 0040   HDL 61 11/24/2016 0040   CHOLHDL 2.7 11/24/2016 0040   VLDL 18 11/24/2016 0040   LDLCALC 86 11/24/2016 0040   HEMOGLOBIN A1C Lab Results  Component Value Date   HGBA1C 5.6 11/24/2016   MPG 114 11/24/2016   TSH No results for input(s): "TSH" in the last 8760 hours.   External labs:   11/02/2021: Sodium 142, potassium 4.5, glucose 100, BUN 31, creatinine 0.81, EGFR 69 Hemoglobin 11.6, hematocrit 35.1, MCV 94.6, platelets 223 TSH 1.45 Hemoglobin A1c 5.8%  05/02/2021: Cholesterol 155, triglycerides 77, HDL 74, LDL 66  Allergies   Allergies  Allergen Reactions   Flecainide Other (See Comments)    Severe dizziness   Oxycodone Other (See Comments)    uphoria   Trimethoprim Other (See Comments)    Other reaction(s): hives   Alendronate Sodium Nausea Only   Avelox [Moxifloxacin Hcl In Nacl] Other (See Comments)    Unknown reaction   Ciprofloxacin Other (See Comments)    Other reaction(s): multiple medication interactions   Other Other (See Comments)    Avelox+cymbalta Together Other reaction(s): flu like symptoms    Medications Prior to Visit:   Outpatient Medications Prior to Visit  Medication Sig Dispense Refill   acetaminophen (TYLENOL) 500 MG tablet Take 1,000 mg by mouth at bedtime.     Alpha-Lipoic Acid (LIPOIC ACID PO) Take 600 mg by mouth  daily.     amiodarone (PACERONE) 200 MG tablet Take 1 tablet (200 mg total) by mouth daily. 90 tablet 3   atorvastatin (LIPITOR) 10 MG tablet TAKE 1 TABLET BY MOUTH EVERY DAY 90 tablet 3   AZO-CRANBERRY PO Take 1 tablet by mouth daily.     Calcium Carbonate-Vitamin D3 600-400 MG-UNIT TABS Take 1 tablet by mouth daily.     cycloSPORINE (RESTASIS) 0.05 % ophthalmic emulsion Place 1 drop into both eyes daily.     denosumab (PROLIA) 60 MG/ML SOSY injection Inject 60 mg into the skin every 6 (six) months.     desmopressin (DDAVP) 0.2 MG tablet Take 3 tablets by mouth as needed.     diltiazem (CARDIZEM CD) 120 MG 24 hr capsule Take 1 capsule (120 mg total) by mouth daily. 90 capsule 1   fluticasone (FLONASE) 50 MCG/ACT nasal spray Place 1 spray into the nose at bedtime.     Investigational - Study Medication Take 1-2 tablets by mouth in the morning and at bedtime. Study name: OCEANIC-AF (Asundexian - factor XIa inhibitor PO QD vs Apixaban PO BID in patients with A. Fib for stroke prevention). Additional study details: Treat study drug like Eliquis.     losartan (COZAAR) 100 MG tablet Take 100 mg by mouth every evening.     meclizine (ANTIVERT) 25 MG tablet Take 25 mg by mouth daily as needed for dizziness.     Melatonin 5 MG CAPS Take 5 mg by mouth  at bedtime.      metoprolol succinate (TOPROL-XL) 25 MG 24 hr tablet Take 25 mg by mouth daily.     Multiple Vitamin (MULTIVITAMIN WITH MINERALS) TABS tablet Take 1-2 tablets by mouth daily. flintstone vitamins Chewable with Iron     multivitamin-iron-minerals-folic acid (CENTRUM) chewable tablet Chew 1 tablet by mouth daily.     nitroGLYCERIN (NITROLINGUAL) 0.4 MG/SPRAY spray Place 1 spray under the tongue every 5 (five) minutes x 3 doses as needed for chest pain. 12 g 1   Omega-3 1000 MG CAPS Take 2,000 mg by mouth daily.      ondansetron (ZOFRAN) 4 MG tablet Take 4 mg by mouth every 8 (eight) hours as needed for nausea or vomiting.     pantoprazole  (PROTONIX) 40 MG tablet Take 40 mg by mouth daily before breakfast.   1   sertraline (ZOLOFT) 25 MG tablet Take 25 mg by mouth daily.     thyroid (ARMOUR) 60 MG tablet Take 60 mg by mouth daily before breakfast.     traMADol (ULTRAM) 50 MG tablet Take 1 tablet (50 mg total) by mouth every 6 (six) hours as needed for severe pain. 50 tablet 0   vitamin C (ASCORBIC ACID) 500 MG tablet Take 500 mg by mouth daily.     No facility-administered medications prior to visit.   Final Medications at End of Visit    Current Meds  Medication Sig   acetaminophen (TYLENOL) 500 MG tablet Take 1,000 mg by mouth at bedtime.   Alpha-Lipoic Acid (LIPOIC ACID PO) Take 600 mg by mouth daily.   amiodarone (PACERONE) 200 MG tablet Take 1 tablet (200 mg total) by mouth daily.   atorvastatin (LIPITOR) 10 MG tablet TAKE 1 TABLET BY MOUTH EVERY DAY   AZO-CRANBERRY PO Take 1 tablet by mouth daily.   Calcium Carbonate-Vitamin D3 600-400 MG-UNIT TABS Take 1 tablet by mouth daily.   cycloSPORINE (RESTASIS) 0.05 % ophthalmic emulsion Place 1 drop into both eyes daily.   denosumab (PROLIA) 60 MG/ML SOSY injection Inject 60 mg into the skin every 6 (six) months.   desmopressin (DDAVP) 0.2 MG tablet Take 3 tablets by mouth as needed.   diltiazem (CARDIZEM CD) 120 MG 24 hr capsule Take 1 capsule (120 mg total) by mouth daily.   fluticasone (FLONASE) 50 MCG/ACT nasal spray Place 1 spray into the nose at bedtime.   Investigational - Study Medication Take 1-2 tablets by mouth in the morning and at bedtime. Study name: OCEANIC-AF (Asundexian - factor XIa inhibitor PO QD vs Apixaban PO BID in patients with A. Fib for stroke prevention). Additional study details: Treat study drug like Eliquis.   losartan (COZAAR) 100 MG tablet Take 100 mg by mouth every evening.   meclizine (ANTIVERT) 25 MG tablet Take 25 mg by mouth daily as needed for dizziness.   Melatonin 5 MG CAPS Take 5 mg by mouth at bedtime.    metoprolol succinate  (TOPROL-XL) 25 MG 24 hr tablet Take 25 mg by mouth daily.   Multiple Vitamin (MULTIVITAMIN WITH MINERALS) TABS tablet Take 1-2 tablets by mouth daily. flintstone vitamins Chewable with Iron   multivitamin-iron-minerals-folic acid (CENTRUM) chewable tablet Chew 1 tablet by mouth daily.   nitroGLYCERIN (NITROLINGUAL) 0.4 MG/SPRAY spray Place 1 spray under the tongue every 5 (five) minutes x 3 doses as needed for chest pain.   Omega-3 1000 MG CAPS Take 2,000 mg by mouth daily.    ondansetron (ZOFRAN) 4 MG tablet Take 4 mg by  mouth every 8 (eight) hours as needed for nausea or vomiting.   pantoprazole (PROTONIX) 40 MG tablet Take 40 mg by mouth daily before breakfast.    sertraline (ZOLOFT) 25 MG tablet Take 25 mg by mouth daily.   thyroid (ARMOUR) 60 MG tablet Take 60 mg by mouth daily before breakfast.   traMADol (ULTRAM) 50 MG tablet Take 1 tablet (50 mg total) by mouth every 6 (six) hours as needed for severe pain.   vitamin C (ASCORBIC ACID) 500 MG tablet Take 500 mg by mouth daily.    Radiology:   No results found.  Cardiac Studies:  PCV ECHOCARDIOGRAM COMPLETE 69/62/9528 Normal LV systolic function with visual EF 60-65%. Left ventricle cavity is normal in size. Mild left ventricular hypertrophy. Normal global wall motion. Normal diastolic filling pattern, normal LAP. Mild (Grade I) mitral regurgitation. Mild calcification of the mitral valve annulus. Mild tricuspid regurgitation. No evidence of pulmonary hypertension. Compared to study 11/09/2017 no significant change.  PCV MYOCARDIAL PERFUSION WITH LEXISCAN 11/20/2020 Nondiagnostic ECG stress. Myocardial perfusion is normal. Overall LV systolic function is normal without regional wall motion abnormalities. Stress LV EF: 50%. No previous exam available for comparison. Low risk study   Event Monitor for 30 days Start date 07/01/2018 for syncope : A. Fib one episode, no symptoms reported. No heart block or significant  bradycardia  Carotid duplex 03/27/2018:  Right Carotid: Velocities in the right ICA are consistent with a 1-39% stenosis. Left Carotid: Velocities in the left ICA are consistent with a 1-39% stenosis. Vertebrals:  Bilateral vertebral arteries demonstrate antegrade flow. Subclavians: Normal flow hemodynamics were seen in bilateral subclavian arteries.   Event Monitor 30 days 11/11/2017: Predominant rhythm is normal sinus rhythm. Symptoms of fatigue reveals normal sinus rhythm. Asymptomatic atrial fibrillation and probable atypical atrial flutter noted on 11/13/2017, 11/18/2017 and 12/09/2017 at 3:00 AM. Occasional PACs. Arrhythmia/PVC burden 6%.   Echo 11/09/2017: Left ventricle: The cavity size was normal. Systolic function was   normal. The estimated ejection fraction was in the range of 60% to 65%. Wall motion was normal; there were no regional wall   motion abnormalities. The study is not technically sufficient to   allow evaluation of LV diastolic function.Pulmonary arteries: PA peak pressure: 32 mm Hg (S).   Coronary angiogram 11/24/2016:  Normal coronaries; tortuous LAD  Loop Recorder   Loop recorder implantation 05/23/2020  Scheduled Remote loop recorder check 06/04/2020: Unremarkable transmission. Occasional PVC. No heart block, no atrial fibrillation. No symptoms reported  Remote loop recorder transmission 02/18/2022:  Predominant rhythm is normal sinus rhythm.  No further atrial fibrillation since 07/04/2021.  AT/AF burden <0.1%. No heart block. No symptoms reported.  EKG:   EKG 03/04/2022: Sinus bradycardia at rate of 58 bpm.  Left axis deviation.  Left anterior fascicular block, incomplete right bundle branch block.  Nonspecific T wave abnormality.  Compared to previous EKG on 09/22/2021, no significant change.  Assessment     ICD-10-CM   1. Paroxysmal atrial fibrillation (HCC)  I48.0 EKG 12-Lead    2. Enrolled in clinical trial of drug - OCEANIC-AF trial Asundexian vs.  Apixaban  Z00.6     3. Essential hypertension, benign  I10     4. Mixed hyperlipidemia  E78.2       No orders of the defined types were placed in this encounter.   There are no discontinued medications.  This patients CHA2DS2-VASc Score 4 (HTN, F, Age)and yearly risk of stroke 4.8%.   Recommendations:   Elyse Hsu  Jonas  is a 81 y.o. Caucasian female with hypertension, hyperlipidemia, frequent UTI,  paroxysmal atrial fibrillation, atrial tachycardia, obstructive sleep apnea on CPAP. Tolerating anticoagulation well. Flecainide discontinued due to dizziness. In the past she has had recurrent episodes of near syncope and severe orthostatic changes with recurrent UTI.  However patient with episodes of syncope and near syncope not associated with UTI.  Patient underwent surgery to her right foot 03/04/2020 and had an episode of syncope while at rehabilitation facility on 04/10/2020.   Recorder transmission 06/2021 revealed persistent atrial fibrillation with RVR, patient was therefore started on amiodarone.   Paroxysmal atrial fibrillation (HCC) Enrolled in clinical trial of drug - OCEANIC-AF trial Asundexian vs. Apixaban EKG today showed sinus bradycardia.  No recurrence of A-fib. She continues on amiodarone 200 mg daily, metoprolol 25 mg daily, and diltiazem 120 mg daily.  Remote loop recorder transmission 02/18/2022:  Predominant rhythm is normal sinus rhythm.  No further atrial fibrillation since 07/04/2021.  AT/AF burden <0.1%. No heart block. No symptoms reported.  Patient is currently enrolled in a [employer]CE[employer]-AF trial Asundexian vs. Apixaban.  She continues to tolerate anticoagulation without bleeding diathesis.  Essential hypertension, benign Blood pressures well controlled at today's visit.  She does not currently monitor her blood pressure at home. Continue current medications without changes.  Mixed hyperlipidemia Independently reviewed and interpreted external labs, lipids are under  excellent control. She continues on atorvastatin 10 mg daily without myalgias. Discussed the importance of continued exercise and maintaining a healthy low-sodium diet.  Follow-up in 6 months or sooner if needed.   Ernst Spell, [place] Office: [phone] Pager: [phone]

## 2022-03-10 DIAGNOSIS — M810 Age-related osteoporosis without current pathological fracture: Secondary | ICD-10-CM | POA: Diagnosis not present

## 2022-03-11 DIAGNOSIS — E039 Hypothyroidism, unspecified: Secondary | ICD-10-CM | POA: Diagnosis not present

## 2022-03-11 DIAGNOSIS — H903 Sensorineural hearing loss, bilateral: Secondary | ICD-10-CM | POA: Diagnosis not present

## 2022-03-11 DIAGNOSIS — Z822 Family history of deafness and hearing loss: Secondary | ICD-10-CM | POA: Diagnosis not present

## 2022-03-16 DIAGNOSIS — R2689 Other abnormalities of gait and mobility: Secondary | ICD-10-CM | POA: Diagnosis not present

## 2022-03-16 DIAGNOSIS — I10 Essential (primary) hypertension: Secondary | ICD-10-CM | POA: Diagnosis not present

## 2022-03-16 DIAGNOSIS — Z23 Encounter for immunization: Secondary | ICD-10-CM | POA: Diagnosis not present

## 2022-03-16 DIAGNOSIS — E039 Hypothyroidism, unspecified: Secondary | ICD-10-CM | POA: Diagnosis not present

## 2022-03-16 DIAGNOSIS — I480 Paroxysmal atrial fibrillation: Secondary | ICD-10-CM | POA: Diagnosis not present

## 2022-03-16 DIAGNOSIS — F3342 Major depressive disorder, recurrent, in full remission: Secondary | ICD-10-CM | POA: Diagnosis not present

## 2022-03-16 DIAGNOSIS — E559 Vitamin D deficiency, unspecified: Secondary | ICD-10-CM | POA: Diagnosis not present

## 2022-03-16 DIAGNOSIS — E782 Mixed hyperlipidemia: Secondary | ICD-10-CM | POA: Diagnosis not present

## 2022-03-20 DIAGNOSIS — Z95818 Presence of other cardiac implants and grafts: Secondary | ICD-10-CM | POA: Diagnosis not present

## 2022-03-20 DIAGNOSIS — Z4509 Encounter for adjustment and management of other cardiac device: Secondary | ICD-10-CM | POA: Diagnosis not present

## 2022-03-20 DIAGNOSIS — R55 Syncope and collapse: Secondary | ICD-10-CM | POA: Diagnosis not present

## 2022-03-30 ENCOUNTER — Ambulatory Visit: Payer: Medicare PPO | Attending: Physical Therapy | Admitting: Physical Therapy

## 2022-03-30 NOTE — Therapy (Incomplete)
OUTPATIENT PHYSICAL THERAPY LOWER EXTREMITY EVALUATION  Patient Name: Cynthia [name] MRN: [id number] DOB:1941/03/01, 81 y.o., female Today's Date: 03/30/2022    Past Medical History:  Diagnosis Date   Acute GI bleeding 10/27/2017   Arthritis    "joints" (11/10/2017)   Dizziness    Dysrhythmia    afib   Encounter for loop recorder check 06/09/2020   GERD (gastroesophageal reflux disease)    GI bleed 11/06/2017   Hearing loss    History of blood transfusion 10/2017   Hyperlipidemia    Hypertension    Hyperthyroidism    Loop recorder Biotronik Loop Recorder 05/23/2020 05/23/2020   Melanoma ([place])    "cut off my back"   Memory loss    Pneumonia    Sleep apnea    cpap   Syncope and collapse    Past Surgical History:  Procedure Laterality Date   ABDOMINAL HYSTERECTOMY     "partial"   APPENDECTOMY     BIOPSY  11/17/2017   Procedure: BIOPSY;  Surgeon: Clarene Essex, MD;  Location: [facility];  Service: Endoscopy;;   COLONOSCOPY WITH PROPOFOL N/A 11/17/2017   Procedure: COLONOSCOPY WITH PROPOFOL;  Surgeon: Clarene Essex, MD;  Location: [facility];  Service: Endoscopy;  Laterality: N/A;   CRANIECTOMY FOR EXCISION OF ACOUSTIC NEUROMA     ESOPHAGOGASTRODUODENOSCOPY (EGD) WITH PROPOFOL N/A 10/28/2017   Procedure: ESOPHAGOGASTRODUODENOSCOPY (EGD) WITH PROPOFOL;  Surgeon: Clarene Essex, MD;  Location: [facility];  Service: Endoscopy;  Laterality: N/A;   FOOT SURGERY Bilateral 04/18/2020   KNEE ARTHROPLASTY Right 06/05/2021   Procedure: COMPUTER ASSISTED TOTAL KNEE ARTHROPLASTY;  Surgeon: Rod Can, MD;  Location: [facility];  Service: Orthopedics;  Laterality: Right;   KNEE ARTHROSCOPY Left    LEFT HEART CATH AND CORONARY ANGIOGRAPHY N/A 11/24/2016   Procedure: LEFT HEART CATH AND CORONARY ANGIOGRAPHY;  Surgeon: Jettie Booze, MD;  Location: [facility];  Service: Cardiovascular;  Laterality: N/A;   MELANOMA EXCISION     "off my back"   NASAL SINUS SURGERY      TONSILLECTOMY     Patient Active Problem List   Diagnosis Date Noted   Degenerative arthritis of right knee 06/05/2021   Osteoarthritis of right knee 06/05/2021   NPH (normal pressure hydrocephalus) ([place]) 08/19/2020   Urinary urgency 08/19/2020   Encounter for loop recorder check 06/09/2020   Loop recorder Biotronik Loop Recorder 05/23/2020 05/23/2020   Paroxysmal A-fib ([place]) 03/24/2020   Gait disturbance 01/29/2020   Periodic limb movement disorder 08/30/2019   Nocturia 07/01/2018   Midline cystocele 06/08/2018   Recurrent UTI 06/08/2018   Vaginal vault prolapse 06/08/2018   Syncope and collapse    Postural dizziness with presyncope 03/17/2018   PAF (paroxysmal atrial fibrillation) ([place]) 03/17/2018   Memory loss 11/25/2017   Abnormal stress test    Hyperlipidemia 11/23/2016   GERD (gastroesophageal reflux disease) 11/23/2016   Polyneuropathy 10/30/2016   Bursitis, subacromial 10/30/2016   Numbness 10/30/2016   Arthritis, senescent 01/03/2016   Pain in joint, ankle and foot 02/28/2015   Dry mouth 08/07/2014   Dry eyes 08/07/2014   Other headache syndrome 06/25/2014   Sinusitis, chronic 06/25/2014   Neck pain 06/25/2014   OSA on CPAP 06/25/2014   Essential hypertension, benign 06/25/2014    PCP: Deland Pretty, MD  REFERRING PROVIDER: Holland Commons, FNP  THERAPY DIAG:  No diagnosis found.  REFERRING DIAG: Poor balance [R26.89], Unsteady gait [R26.81]   Rationale for Evaluation and Treatment:  Rehabilitation  SUBJECTIVE:  PERTINENT PAST HISTORY:  hypertension, hyperlipidemia, frequent UTI, paroxysmal atrial fibrillation, atrial tachycardia, dizziness, memory loss, obstructive sleep apnea on CPAP         PRECAUTIONS: Fall  WEIGHT BEARING RESTRICTIONS No  FALLS:  Has patient fallen in last 6 months? {yes/no:20286}, Number of falls: ***  MOI/History of condition:  Onset date: ***  SUBJECTIVE STATEMENT  Cynthia [name] is a 81 y.o. female who presents  to clinic with chief complaint of ***.  ***  From referring provider:   "Cynthia [name]  is a 81 y.o. Caucasian female with hypertension, hyperlipidemia, frequent UTI,  paroxysmal atrial fibrillation, atrial tachycardia, obstructive sleep apnea on CPAP. Tolerating anticoagulation well. Flecainide discontinued due to dizziness. In the past she has had recurrent episodes of near syncope and severe orthostatic changes with recurrent UTI.  However patient with episodes of syncope and near syncope not associated with UTI.  Patient underwent surgery to her right foot 03/04/2020 and had an episode of syncope while at rehabilitation facility on 04/10/2020.    Recorder transmission 06/2021 revealed persistent atrial fibrillation with RVR, patient was therefore started on amiodarone.    She presents today for 72-monthfollow-up.  She was last seen in the office on 09/22/2021 with complaints of dizziness and medication compliance difficulty. She was enrolled in a [place] clinical trial and appears to have been taking double the prescribed amount of study drug. Dizziness was felt to be related to medication therefore no changes were made.  Overall, she is doing well without any complaints today.  She remains moderately active and walks a couple times a week.  She is maintaining a healthy diet. She denies chest pain, shortness of breath, palpitations, leg edema, orthopnea, PND, syncope."   Red flags:  {has/denies:26543} {kerredflag:26542}  Pain:  Are you having pain? {yes/no:20286} Pain location: *** NPRS scale:  {NUMBERS; 0-10:5044}/10 to {NUMBERS; 0-10:5044}/10 Aggravating factors: *** Relieving factors: *** Pain description: {PAIN DESCRIPTION:21022940} Stage: {Desc; acute/subacute/chronic:13799} Stability: {kerbetterworse:26715} 24 hour pattern: ***   Occupation: ***  Assistive Device: ***  Hand Dominance: ***  Patient Goals/Specific Activities: ***   OBJECTIVE:   DIAGNOSTIC FINDINGS:   ***   GENERAL OBSERVATION/GAIT:   ***  SENSATION:  Light touch: {intact/deficits:24005}  PALPATION: ***  MUSCLE LENGTH: Hamstrings: Right {kerminsig:27227} restriction; Left {kerminsig:27227} restriction ASLR: Right {keraslr:27228}; Left {keraslr:27228} TMarcello Moorestest: Right {kerminsig:27227} restriction; Left {kerminsig:27227} restriction Ely's test: Right {kerminsig:27227} restriction; Left {kerminsig:27227} restriction  LE MMT:  MMT Right 03/30/2022 Left 03/30/2022  Hip flexion (L2, L3) *** ***  Knee extension (L3) *** ***  Knee flexion *** ***  Hip abduction *** ***  Hip extension    Hip external rotation    Hip internal rotation    Hip adduction    Ankle dorsiflexion (L4)    Ankle plantarflexion (S1)    Ankle inversion    Ankle eversion    Great Toe ext (L5)    Grossly     (Blank rows = not tested, score listed is out of 5 possible points.  N = WNL, D = diminished, C = clear for gross weakness with myotome testing, * = concordant pain with testing)  LE ROM:  ROM Right 03/30/2022 Left 03/30/2022  Hip flexion    Hip extension    Hip abduction    Hip adduction    Hip internal rotation    Hip external rotation    Knee flexion    Knee extension    Ankle dorsiflexion    Ankle plantarflexion  Ankle inversion    Ankle eversion     (Blank rows = not tested, N = WNL, * = concordant pain with testing)  Functional Tests  Eval (03/30/2022)    BERG BALANCE  Sitting to Standing: {Numbers; 0-4:31231:::1}  4. Stands without using hands and stabilize independently  3. Stands independently using hands  2. Stands using hands after multiple trials  1. Min A to stand  0. Mod-Max A to stand Standing unsupported: {Numbers; 0-4:31231:::1}  4. Stands safely for 2 minutes  3. Stands 2 minutes with supervision  2. Stands 30 seconds unsupported  1. Needs several tries to stand unsupported for 30 seconds  0. Unable to stand unsupported for 30 seconds Sitting  unsupported: {Numbers; 0-4:31231:::1}  4. Sits for 2 minutes independently  3. Sits for 2 minutes with supervision  2. Able to sit 30 seconds  1. Able to sit 10 seconds  0. Unable to sit for 10 seconds Standing to Sitting: {Numbers; 0-4:31231:::1} 4. Sits safely with minimal use of hands 3. Controls descent with hands 2. Uses back of legs against chair to control descent 1. Sits independently, but uncontrolled descent 0. Needs assistance Transfers: {Numbers; 0-4:31231:::1}  4. Transfers safely with minor use of hands  3. Transfers safely definite use of hands  2. Transfers with verbal cueing/supervision  1. Needs 1 person assist  0. Needs 2 person assist  Standing with eyes closed: {Numbers; 0-4:31231:::1}  4. Stands safely for 10 seconds  3. Stands 10 seconds with supervision   2. Able to stand for 3 seconds  1. Unable to keep eyes closed for 3 seconds, but is safe  0. Needs assist to keep from falling Standing with feet together: {Numbers; 0-4:31231:::1} 4. Stands for 1 minute safely 3. Stands for 1 minute with supervision 2. Unable to hold for 30 seconds  1. Needs help to attain position but can hold for 15 seconds  0. Needs help to attain position and unable to hold for 15 seconds Reaching forward with outstretched arm: {Numbers; 0-4:31231:::1}  4. Reaches forward 10 inches  3. Reaches forward 5 inches  2. Reaches forward 2 inches  1. Reaches forward with supervision  0. Loses balance/requires assistace Retrieving object from the floor: {Numbers; 0-4:31231:::1} 4. Able to pick up easily and safely 3. Able to pick up with supervision 2. Unable to pick up, but reaches within 1-2 inches independently 1. Unable to pick up and needs supervision 0. Unable/needs assistance to keep from falling  Turning to look behind: {Numbers; 0-4:31231:::1}  4. Looks behind from both sides and weight shifts well  3. Looks behind one side only, other side less weight shift  2. Turns  sideways only, maintains balance  1. Needs supervision when turning  0. Needs assistance  Turning 360 degrees: {Numbers; 0-4:31231:::1}  4. Able to turn in </=4 seconds  3. Able to turn on one side in </= 4 seconds   2. Able to turn slowly, but safely  1. Needs supervision or verbal cueing  0. Needs assistance Place alternate foot on stool: {Numbers; 0-4:31231:::1} 4. Completes 8 steps in 20 seconds 3. Completes 8 steps in >20 seconds 2. 4 steps without assistance/supervision 1. Completes >2 steps with minimal assist 0. Unable, needs assist to keep from falling Standing with one foot in front: {Numbers; 0-4:31231:::1}  4. Independent tandem for 30 seconds  3. Independent foot ahead for 30 seconds  2. Independent small step for 30 seconds  1. Needs help to step, but can  hold for 15 seconds  0. Loses balance while standing/stepping Standing on one foot: {Numbers; 0-4:31231:::1} 4. Holds >10 seconds 3. Holds 5-10 seconds 2. Holds >/=3 seconds  1. Holds <3 seconds 0. Unable   Total Score: ***/56                                                          LOWER EXTREMITY SPECIAL TESTS:  Knee special tests: {KNEE SPECIAL PPIRJ:18841}   PATIENT SURVEYS:  {rehab surveys:24030}   TODAY'S TREATMENT: ***   PATIENT EDUCATION:  POC, diagnosis, prognosis, HEP, and outcome measures.  Pt educated via explanation, demonstration, and handout (HEP).  Pt confirms understanding verbally.   HOME EXERCISE PROGRAM: ***  ASTERISK SIGNS   Asterisk Signs Eval (03/30/2022)                                                 ASSESSMENT:  CLINICAL IMPRESSION: Cynthia [name] is a 81 y.o. female who presents to clinic with signs and sxs consistent with ***.    OBJECTIVE IMPAIRMENTS: Pain, ***  ACTIVITY LIMITATIONS: ***  PERSONAL FACTORS: See medical history and pertinent history   REHAB POTENTIAL: {rehabpotential:25112}  CLINICAL DECISION MAKING: {clinical decision  making:25114}  EVALUATION COMPLEXITY: {Evaluation complexity:25115}   GOALS:   SHORT TERM GOALS: Target date: 04/27/2022  Cynthia [name] will be >75% HEP compliant to improve carryover between sessions and facilitate independent management of condition  Evaluation (03/30/2022): ongoing Goal status: INITIAL   LONG TERM GOALS: Target date: 05/25/2022  Cynthia [name] will improve FOTO score to *** as a proxy for functional improvement  Evaluation/Baseline (03/30/2022): *** Goal status: INITIAL   2.  Cynthia [name] will imporve BERG balance score to *** pts, to show a significant improvement in balance in order to reduce fall risk  Evaluation/Baseline (03/30/2022): *** pts Goal status: INITIAL  See interpretation below:      3.  Cynthia [name] will improve the following MMTs to >/= ***/5 to show improvement in strength:  ***   Evaluation/Baseline (03/30/2022): see chart in note Goal status: INITIAL   4.  ***   5.  ***   6.  ***   PLAN: PT FREQUENCY: 1-2x/week  PT DURATION: 8 weeks (Ending 05/25/2022)  PLANNED INTERVENTIONS: Therapeutic exercises, Aquatic therapy, Therapeutic activity, Neuro Muscular re-education, Gait training, Patient/Family education, Joint mobilization, Dry Needling, Electrical stimulation, Spinal mobilization and/or manipulation, Moist heat, Taping, Vasopneumatic device, Ionotophoresis '4mg'$ /ml Dexamethasone, and Manual therapy  PLAN FOR NEXT SESSION: ***   Shearon Balo PT, DPT 03/30/2022, 6:59 AM

## 2022-03-31 DIAGNOSIS — N952 Postmenopausal atrophic vaginitis: Secondary | ICD-10-CM | POA: Diagnosis not present

## 2022-03-31 DIAGNOSIS — R350 Frequency of micturition: Secondary | ICD-10-CM | POA: Diagnosis not present

## 2022-03-31 DIAGNOSIS — R351 Nocturia: Secondary | ICD-10-CM | POA: Diagnosis not present

## 2022-03-31 DIAGNOSIS — F0390 Unspecified dementia without behavioral disturbance: Secondary | ICD-10-CM | POA: Diagnosis not present

## 2022-03-31 DIAGNOSIS — Z87440 Personal history of urinary (tract) infections: Secondary | ICD-10-CM | POA: Diagnosis not present

## 2022-04-14 ENCOUNTER — Telehealth: Payer: Self-pay

## 2022-04-14 ENCOUNTER — Other Ambulatory Visit: Payer: Self-pay | Admitting: Cardiology

## 2022-04-14 NOTE — Telephone Encounter (Signed)
Patient husband called to say she is having symptoms of afib and fell over a chair and hurt her chest over the weekend. Also, She is currently on an antibiotic for a UTI

## 2022-04-14 NOTE — Telephone Encounter (Signed)
Most probably Orthostatic hypotension and not atrial fib. Loop recorder does not show A Fib. Check BP standing and probably very low and from UTI. Fluids and salty drinks will help. Hold Losartan for 2 days and start at 50 mg daily only instead of 100 mg daily

## 2022-04-15 ENCOUNTER — Telehealth: Payer: Self-pay | Admitting: Pharmacist

## 2022-04-15 DIAGNOSIS — I480 Paroxysmal atrial fibrillation: Secondary | ICD-10-CM

## 2022-04-15 MED ORDER — APIXABAN 5 MG PO TABS: 5.0000 mg | ORAL_TABLET | Freq: Two times a day (BID) | ORAL | 6 refills | Status: DC

## 2022-04-15 NOTE — Telephone Encounter (Signed)
Patient came on 28Dec2023 for Common End of Treat visit for [facility] AF study.  Study medicine was stopped on 27Dec2023 and collected by site staff.  Patient resumed prior anticoagulant, Eliquis 5 mg BID on 28Dec2023.  Patient was given samples of Eliquis and advised to follow up with your office.

## 2022-04-15 NOTE — Telephone Encounter (Signed)
ICD-10-CM   1. Paroxysmal atrial fibrillation (HCC)  I48.0 apixaban (ELIQUIS) 5 MG TABS tablet      Meds ordered this encounter  Medications   apixaban (ELIQUIS) 5 MG TABS tablet    Sig: Take 1 tablet (5 mg total) by mouth 2 (two) times daily.    Dispense:  60 tablet    Refill:  6    Medications Discontinued During This Encounter  Medication Reason   Investigational - Study Medication Ineffective   apixaban (ELIQUIS) 5 MG TABS tablet Reorder

## 2022-04-20 DIAGNOSIS — Z95818 Presence of other cardiac implants and grafts: Secondary | ICD-10-CM | POA: Diagnosis not present

## 2022-04-20 DIAGNOSIS — R55 Syncope and collapse: Secondary | ICD-10-CM | POA: Diagnosis not present

## 2022-04-20 DIAGNOSIS — Z4509 Encounter for adjustment and management of other cardiac device: Secondary | ICD-10-CM | POA: Diagnosis not present

## 2022-04-24 ENCOUNTER — Other Ambulatory Visit: Payer: Self-pay

## 2022-04-24 DIAGNOSIS — E782 Mixed hyperlipidemia: Secondary | ICD-10-CM | POA: Diagnosis not present

## 2022-04-24 DIAGNOSIS — G4733 Obstructive sleep apnea (adult) (pediatric): Secondary | ICD-10-CM | POA: Diagnosis not present

## 2022-04-24 DIAGNOSIS — F419 Anxiety disorder, unspecified: Secondary | ICD-10-CM | POA: Diagnosis not present

## 2022-04-24 DIAGNOSIS — M150 Primary generalized (osteo)arthritis: Secondary | ICD-10-CM | POA: Diagnosis not present

## 2022-04-24 DIAGNOSIS — I10 Essential (primary) hypertension: Secondary | ICD-10-CM | POA: Diagnosis not present

## 2022-04-24 DIAGNOSIS — E559 Vitamin D deficiency, unspecified: Secondary | ICD-10-CM | POA: Diagnosis not present

## 2022-04-24 DIAGNOSIS — I480 Paroxysmal atrial fibrillation: Secondary | ICD-10-CM | POA: Diagnosis not present

## 2022-04-24 DIAGNOSIS — F3342 Major depressive disorder, recurrent, in full remission: Secondary | ICD-10-CM | POA: Diagnosis not present

## 2022-04-24 DIAGNOSIS — K219 Gastro-esophageal reflux disease without esophagitis: Secondary | ICD-10-CM | POA: Diagnosis not present

## 2022-04-24 MED ORDER — METOPROLOL SUCCINATE ER 25 MG PO TB24: 25.0000 mg | ORAL_TABLET | Freq: Every day | ORAL | 3 refills | Status: DC

## 2022-04-24 NOTE — Telephone Encounter (Signed)
[facility] nurse called and wanted to confirm that patient was still supposed to be taking Eliquis and Metoprolol. Last ov note and telephone communication was given to her verbally. Nurse verbalized understanding.

## 2022-04-27 DIAGNOSIS — G4733 Obstructive sleep apnea (adult) (pediatric): Secondary | ICD-10-CM | POA: Diagnosis not present

## 2022-04-27 DIAGNOSIS — I480 Paroxysmal atrial fibrillation: Secondary | ICD-10-CM | POA: Diagnosis not present

## 2022-04-27 DIAGNOSIS — E559 Vitamin D deficiency, unspecified: Secondary | ICD-10-CM | POA: Diagnosis not present

## 2022-04-27 DIAGNOSIS — E782 Mixed hyperlipidemia: Secondary | ICD-10-CM | POA: Diagnosis not present

## 2022-04-27 DIAGNOSIS — M150 Primary generalized (osteo)arthritis: Secondary | ICD-10-CM | POA: Diagnosis not present

## 2022-04-27 DIAGNOSIS — K219 Gastro-esophageal reflux disease without esophagitis: Secondary | ICD-10-CM | POA: Diagnosis not present

## 2022-04-27 DIAGNOSIS — I10 Essential (primary) hypertension: Secondary | ICD-10-CM | POA: Diagnosis not present

## 2022-04-27 DIAGNOSIS — F3342 Major depressive disorder, recurrent, in full remission: Secondary | ICD-10-CM | POA: Diagnosis not present

## 2022-04-27 DIAGNOSIS — F419 Anxiety disorder, unspecified: Secondary | ICD-10-CM | POA: Diagnosis not present

## 2022-04-29 DIAGNOSIS — E782 Mixed hyperlipidemia: Secondary | ICD-10-CM | POA: Diagnosis not present

## 2022-04-29 DIAGNOSIS — F3342 Major depressive disorder, recurrent, in full remission: Secondary | ICD-10-CM | POA: Diagnosis not present

## 2022-04-29 DIAGNOSIS — K219 Gastro-esophageal reflux disease without esophagitis: Secondary | ICD-10-CM | POA: Diagnosis not present

## 2022-04-29 DIAGNOSIS — F419 Anxiety disorder, unspecified: Secondary | ICD-10-CM | POA: Diagnosis not present

## 2022-04-29 DIAGNOSIS — E559 Vitamin D deficiency, unspecified: Secondary | ICD-10-CM | POA: Diagnosis not present

## 2022-04-29 DIAGNOSIS — G4733 Obstructive sleep apnea (adult) (pediatric): Secondary | ICD-10-CM | POA: Diagnosis not present

## 2022-04-29 DIAGNOSIS — M150 Primary generalized (osteo)arthritis: Secondary | ICD-10-CM | POA: Diagnosis not present

## 2022-04-29 DIAGNOSIS — I10 Essential (primary) hypertension: Secondary | ICD-10-CM | POA: Diagnosis not present

## 2022-04-29 DIAGNOSIS — I480 Paroxysmal atrial fibrillation: Secondary | ICD-10-CM | POA: Diagnosis not present

## 2022-04-30 ENCOUNTER — Other Ambulatory Visit: Payer: Self-pay

## 2022-04-30 ENCOUNTER — Emergency Department (HOSPITAL_BASED_OUTPATIENT_CLINIC_OR_DEPARTMENT_OTHER)
Admission: EM | Admit: 2022-04-30 | Discharge: 2022-04-30 | Disposition: A | Discharge status: Home / Self Care | Payer: Medicare PPO | Attending: Emergency Medicine | Admitting: Emergency Medicine

## 2022-04-30 ENCOUNTER — Emergency Department (HOSPITAL_BASED_OUTPATIENT_CLINIC_OR_DEPARTMENT_OTHER): Payer: Medicare PPO

## 2022-04-30 ENCOUNTER — Encounter (HOSPITAL_BASED_OUTPATIENT_CLINIC_OR_DEPARTMENT_OTHER): Payer: Self-pay | Admitting: Urology

## 2022-04-30 DIAGNOSIS — M25571 Pain in right ankle and joints of right foot: Secondary | ICD-10-CM | POA: Diagnosis not present

## 2022-04-30 DIAGNOSIS — Z96651 Presence of right artificial knee joint: Secondary | ICD-10-CM | POA: Insufficient documentation

## 2022-04-30 DIAGNOSIS — W010XXA Fall on same level from slipping, tripping and stumbling without subsequent striking against object, initial encounter: Secondary | ICD-10-CM | POA: Diagnosis not present

## 2022-04-30 DIAGNOSIS — S7001XA Contusion of right hip, initial encounter: Secondary | ICD-10-CM | POA: Insufficient documentation

## 2022-04-30 DIAGNOSIS — S8391XA Sprain of unspecified site of right knee, initial encounter: Secondary | ICD-10-CM | POA: Diagnosis not present

## 2022-04-30 DIAGNOSIS — M25551 Pain in right hip: Secondary | ICD-10-CM | POA: Diagnosis not present

## 2022-04-30 DIAGNOSIS — S8001XA Contusion of right knee, initial encounter: Secondary | ICD-10-CM | POA: Diagnosis not present

## 2022-04-30 DIAGNOSIS — Z7901 Long term (current) use of anticoagulants: Secondary | ICD-10-CM | POA: Insufficient documentation

## 2022-04-30 DIAGNOSIS — Y92009 Unspecified place in unspecified non-institutional (private) residence as the place of occurrence of the external cause: Secondary | ICD-10-CM | POA: Diagnosis not present

## 2022-04-30 DIAGNOSIS — W19XXXA Unspecified fall, initial encounter: Secondary | ICD-10-CM

## 2022-04-30 DIAGNOSIS — M25561 Pain in right knee: Secondary | ICD-10-CM | POA: Diagnosis not present

## 2022-04-30 MED ORDER — TRAMADOL HCL 50 MG PO TABS: 50.0000 mg | ORAL_TABLET | Freq: Four times a day (QID) | ORAL | 0 refills | Status: DC | PRN

## 2022-04-30 NOTE — ED Triage Notes (Signed)
Pt states fell last night after tripping over a laundry basket Denies any head injury  States right ankle pain, right knee pain, and right hip pain as well  Pt ambulatory with walker into triage Pt is on blood thinners

## 2022-04-30 NOTE — ED Provider Notes (Signed)
[facility] EM[facility]ENCY DEPARTMENT Provider Note   CSN: [id number] Arrival date & time: 04/30/22  1644     History  Chief Complaint  Patient presents with   Cynthia [name]    Cynthia [name] is a 82 y.o. female.  HPI Patient fell at home yesterday evening.  She stumbled over a laundry basket and lost her balance.  It caused her to rotate and ended up falling to the floor.  However she reports she fell fairly "gracefully".  She did not hit the floor hard.  But she did land on the right side on her right hip knee and ankle.  She reports she has some pain in those at this time.  However she has been up and ambulatory without gait limitation.  She reports she is anticoagulated on Eliquis and she thinks that her doctor wanted her to get checked due to concerns for possible areas of internal bleeding.  Patient denies she struck her head.  She has no headache.  She has had no confusion.  She has no chest pain.  No shortness of breath.  No abdominal pain.  Patient reports that she takes tramadol at night with good success for pain control.    Home Medications Prior to Admission medications   Medication Sig Start Date End Date Taking? Authorizing Provider  traMADol (ULTRAM) 50 MG tablet Take 1 tablet (50 mg total) by mouth every 6 (six) hours as needed. 04/30/22  Yes Charlesetta Shanks, MD  acetaminophen (TYLENOL) 500 MG tablet Take 1,000 mg by mouth at bedtime.    [provider]  Alpha-Lipoic Acid (LIPOIC ACID PO) Take 600 mg by mouth daily.    [provider]  amiodarone (PACERONE) 200 MG tablet Take 1 tablet (200 mg total) by mouth daily. 09/10/21   Cantwell, Celeste C, PA-C  apixaban (ELIQUIS) 5 MG TABS tablet Take 1 tablet (5 mg total) by mouth 2 (two) times daily. 04/15/22   Adrian Prows, MD  atorvastatin (LIPITOR) 10 MG tablet TAKE 1 TABLET BY MOUTH EVERY DAY 03/10/21   Cantwell, Celeste C, PA-C  AZO-CRANBERRY PO Take 1 tablet by mouth daily.    [provider]  Calcium  Carbonate-Vitamin D3 600-400 MG-UNIT TABS Take 1 tablet by mouth daily.    [provider]  cycloSPORINE (RESTASIS) 0.05 % ophthalmic emulsion Place 1 drop into both eyes daily.    [provider]  denosumab (PROLIA) 60 MG/ML SOSY injection Inject 60 mg into the skin every 6 (six) months.    [provider]  desmopressin (DDAVP) 0.2 MG tablet Take 3 tablets by mouth as needed.    [provider]  diltiazem (CARDIZEM CD) 120 MG 24 hr capsule Take 1 capsule (120 mg total) by mouth daily. 07/02/21   Cantwell, Celeste C, PA-C  fluticasone (FLONASE) 50 MCG/ACT nasal spray Place 1 spray into the nose at bedtime.    [provider]  losartan (COZAAR) 100 MG tablet Take 50 mg by mouth every evening. 04/15/20   [provider]  meclizine (ANTIVERT) 25 MG tablet Take 25 mg by mouth daily as needed for dizziness.    [provider]  Melatonin 5 MG CAPS Take 5 mg by mouth at bedtime.     [provider]  metoprolol succinate (TOPROL-XL) 25 MG 24 hr tablet Take 1 tablet (25 mg total) by mouth daily. 04/24/22   Adrian Prows, MD  Multiple Vitamin (MULTIVITAMIN WITH MINERALS) TABS tablet Take 1-2 tablets by mouth daily. flintstone vitamins  Chewable with Iron    [provider]  multivitamin-iron-minerals-folic acid (CENTRUM) chewable tablet Chew 1 tablet by mouth daily.    [provider]  nitroGLYCERIN (NITROLINGUAL) 0.4 MG/SPRAY spray Place 1 spray under the tongue every 5 (five) minutes x 3 doses as needed for chest pain. 03/19/21   Cantwell, Celeste C, PA-C  Omega-3 1000 MG CAPS Take 2,000 mg by mouth daily.     [provider]  ondansetron (ZOFRAN) 4 MG tablet Take 4 mg by mouth every 8 (eight) hours as needed for nausea or vomiting.    [provider]  pantoprazole (PROTONIX) 40 MG tablet Take 40 mg by mouth daily before breakfast.  06/12/14   [provider]  sertraline (ZOLOFT) 25 MG tablet Take 25 mg  by mouth daily.    [provider]  thyroid (ARMOUR) 60 MG tablet Take 60 mg by mouth daily before breakfast. 11/07/17   [provider]  traMADol (ULTRAM) 50 MG tablet Take 1 tablet (50 mg total) by mouth every 6 (six) hours as needed for severe pain. 06/08/21   Drue Novel, PA  vitamin C (ASCORBIC ACID) 500 MG tablet Take 500 mg by mouth daily.    [provider]      Allergies    Flecainide, Oxycodone, Trimethoprim, Alendronate sodium, Avelox [moxifloxacin hcl in nacl], Ciprofloxacin, and Other    Review of Systems   Review of Systems  Physical Exam Updated Vital Signs BP (!) 153/76   Pulse (!) 57   Temp 97.9 F (36.6 C) (Oral)   Resp 20   Ht '5\' 5"'$  (1.651 m)   Wt 70.3 kg   SpO2 96%   BMI 25.79 kg/m  Physical Exam Constitutional:      Comments: Alert nontoxic clinically well in appearance.  HENT:     Head: Normocephalic and atraumatic.     Mouth/Throat:     Mouth: Mucous membranes are moist.     Pharynx: Oropharynx is clear.  Eyes:     Extraocular Movements: Extraocular movements intact.     Pupils: Pupils are equal, round, and reactive to light.  Cardiovascular:     Rate and Rhythm: Normal rate and regular rhythm.  Pulmonary:     Effort: Pulmonary effort is normal.     Breath sounds: Normal breath sounds.  Chest:     Chest wall: No tenderness.  Abdominal:     General: There is no distension.     Palpations: Abdomen is soft.     Tenderness: There is no abdominal tenderness. There is no guarding.  Musculoskeletal:        General: Normal range of motion.     Cervical back: Neck supple.     Comments: No deformities of extremities.  Intact range of motion at the right hip, knee and ankle.  No visible contusions or joint effusions.  Patient has had prior knee replacement surgery with well-healed scar on the right.  She has no lower extremity edema.  No discoloration or swelling around the ankle or the foot.  Foot is warm and dry on the right.   Dorsalis pedis pulses 2+ and strong.  Skin:    General: Skin is warm and dry.  Neurological:     General: No focal deficit present.     Mental Status: She is oriented to person, place, and time.     Motor: No weakness.     Coordination: Coordination normal.  Psychiatric:  Mood and Affect: Mood normal.     ED Results / Procedures / Treatments   Labs (all labs ordered are listed, but only abnormal results are displayed) Labs Reviewed - No data to display  EKG None  Radiology DG Knee Complete 4 Views Right  Result Date: 04/30/2022 CLINICAL DATA:  Fall, right knee pain EXAM: RIGHT KNEE - COMPLETE 4+ VIEW COMPARISON:  11/19/2021 FINDINGS: Total knee prosthesis. No periprosthetic fracture or other acute bony finding. Possible small knee joint effusion. IMPRESSION: 1. Possible small knee joint effusion. 2. Total knee prosthesis without complicating feature. Electronically Signed   By: Van Clines M.D.   On: 04/30/2022 17:48   DG Hip Unilat W or Wo Pelvis 2-3 Views Right  Result Date: 04/30/2022 CLINICAL DATA:  Fall, right hip pain EXAM: DG HIP (WITH OR WITHOUT PELVIS) 2-3V RIGHT COMPARISON:  CT pelvis 11/19/2021 FINDINGS: No fracture or acute bony finding identified. Lower lumbar spondylosis. Mild bony demineralization. IMPRESSION: 1. No fracture or acute bony findings. 2. Lower lumbar spondylosis. 3. Mild bony demineralization. Electronically Signed   By: Van Clines M.D.   On: 04/30/2022 17:47   DG Ankle Complete Right  Result Date: 04/30/2022 CLINICAL DATA:  Pain after fall EXAM: RIGHT ANKLE - COMPLETE 3 VIEW COMPARISON:  None Available. FINDINGS: Osteopenia.  No fracture or dislocation.  Preserved joint spaces. IMPRESSION: Osteopenia.  No acute osseous abnormality Electronically Signed   By: Jill Side M.D.   On: 04/30/2022 17:37    Procedures Procedures    Medications Ordered in ED Medications - No data to display  ED Course/ Medical Decision Making/  A&P                             Medical Decision Making Amount and/or Complexity of Data Reviewed Radiology: ordered.   Patient presents with fall yesterday evening.  She denies striking her head.  She has not had any signs or symptoms of intracranial bleeding.  She denies chest or abdominal injury.  She has had no subsequent signs of pain, shortness of breath.  She does report discomfort in the right hip and knee and ankle but physical exam does not show deformity or joint effusions.  X-rays obtained and reviewed by radiology negative for acute findings on the hip knee or ankle.  This correlates clinically with the physical exam.  At this time patient is in good condition.  She can continue tramadol and extra strength Tylenol as needed for pain.  All results and plan for home management are reviewed with patient's husband at bedside.  They voiced understanding and agree with plan.        Final Clinical Impression(s) / ED Diagnoses Final diagnoses:  Fall, initial encounter  Sprain of right knee, unspecified ligament, initial encounter  Contusion of right hip, initial encounter    Rx / DC Orders ED Discharge Orders          Ordered    traMADol (ULTRAM) 50 MG tablet  Every 6 hours PRN        04/30/22 1946              Charlesetta Shanks, MD 04/30/22 1950

## 2022-04-30 NOTE — Discharge Instructions (Signed)
1.  Apply well wrapped ice packs to the bruised or tender areas.  This time your x-rays do not show anything to be broken. 2.  You may take 1 tramadol tablet with Tylenol as needed for pain. 3.  Follow-up with your doctor for recheck next week.  If you develop sudden worsening of pain, chest pain, headache or abdominal pain, return to the emergency department for recheck.

## 2022-04-30 NOTE — ED Notes (Signed)
Pt called for triage, in [place] at this time per husband

## 2022-05-01 DIAGNOSIS — E782 Mixed hyperlipidemia: Secondary | ICD-10-CM | POA: Diagnosis not present

## 2022-05-01 DIAGNOSIS — E559 Vitamin D deficiency, unspecified: Secondary | ICD-10-CM | POA: Diagnosis not present

## 2022-05-01 DIAGNOSIS — G4733 Obstructive sleep apnea (adult) (pediatric): Secondary | ICD-10-CM | POA: Diagnosis not present

## 2022-05-01 DIAGNOSIS — F3342 Major depressive disorder, recurrent, in full remission: Secondary | ICD-10-CM | POA: Diagnosis not present

## 2022-05-01 DIAGNOSIS — I10 Essential (primary) hypertension: Secondary | ICD-10-CM | POA: Diagnosis not present

## 2022-05-01 DIAGNOSIS — K219 Gastro-esophageal reflux disease without esophagitis: Secondary | ICD-10-CM | POA: Diagnosis not present

## 2022-05-01 DIAGNOSIS — F419 Anxiety disorder, unspecified: Secondary | ICD-10-CM | POA: Diagnosis not present

## 2022-05-01 DIAGNOSIS — I480 Paroxysmal atrial fibrillation: Secondary | ICD-10-CM | POA: Diagnosis not present

## 2022-05-01 DIAGNOSIS — M150 Primary generalized (osteo)arthritis: Secondary | ICD-10-CM | POA: Diagnosis not present

## 2022-05-05 DIAGNOSIS — I480 Paroxysmal atrial fibrillation: Secondary | ICD-10-CM | POA: Diagnosis not present

## 2022-05-05 DIAGNOSIS — I10 Essential (primary) hypertension: Secondary | ICD-10-CM | POA: Diagnosis not present

## 2022-05-05 DIAGNOSIS — F3342 Major depressive disorder, recurrent, in full remission: Secondary | ICD-10-CM | POA: Diagnosis not present

## 2022-05-05 DIAGNOSIS — E782 Mixed hyperlipidemia: Secondary | ICD-10-CM | POA: Diagnosis not present

## 2022-05-06 DIAGNOSIS — I10 Essential (primary) hypertension: Secondary | ICD-10-CM | POA: Diagnosis not present

## 2022-05-06 DIAGNOSIS — E782 Mixed hyperlipidemia: Secondary | ICD-10-CM | POA: Diagnosis not present

## 2022-05-06 DIAGNOSIS — I480 Paroxysmal atrial fibrillation: Secondary | ICD-10-CM | POA: Diagnosis not present

## 2022-05-06 DIAGNOSIS — K219 Gastro-esophageal reflux disease without esophagitis: Secondary | ICD-10-CM | POA: Diagnosis not present

## 2022-05-06 DIAGNOSIS — F3342 Major depressive disorder, recurrent, in full remission: Secondary | ICD-10-CM | POA: Diagnosis not present

## 2022-05-06 DIAGNOSIS — E559 Vitamin D deficiency, unspecified: Secondary | ICD-10-CM | POA: Diagnosis not present

## 2022-05-06 DIAGNOSIS — M150 Primary generalized (osteo)arthritis: Secondary | ICD-10-CM | POA: Diagnosis not present

## 2022-05-06 DIAGNOSIS — G4733 Obstructive sleep apnea (adult) (pediatric): Secondary | ICD-10-CM | POA: Diagnosis not present

## 2022-05-06 DIAGNOSIS — F419 Anxiety disorder, unspecified: Secondary | ICD-10-CM | POA: Diagnosis not present

## 2022-05-07 DIAGNOSIS — N390 Urinary tract infection, site not specified: Secondary | ICD-10-CM | POA: Diagnosis not present

## 2022-05-08 DIAGNOSIS — F419 Anxiety disorder, unspecified: Secondary | ICD-10-CM | POA: Diagnosis not present

## 2022-05-08 DIAGNOSIS — G4733 Obstructive sleep apnea (adult) (pediatric): Secondary | ICD-10-CM | POA: Diagnosis not present

## 2022-05-08 DIAGNOSIS — I10 Essential (primary) hypertension: Secondary | ICD-10-CM | POA: Diagnosis not present

## 2022-05-08 DIAGNOSIS — I480 Paroxysmal atrial fibrillation: Secondary | ICD-10-CM | POA: Diagnosis not present

## 2022-05-08 DIAGNOSIS — E782 Mixed hyperlipidemia: Secondary | ICD-10-CM | POA: Diagnosis not present

## 2022-05-08 DIAGNOSIS — M150 Primary generalized (osteo)arthritis: Secondary | ICD-10-CM | POA: Diagnosis not present

## 2022-05-08 DIAGNOSIS — K219 Gastro-esophageal reflux disease without esophagitis: Secondary | ICD-10-CM | POA: Diagnosis not present

## 2022-05-08 DIAGNOSIS — E559 Vitamin D deficiency, unspecified: Secondary | ICD-10-CM | POA: Diagnosis not present

## 2022-05-08 DIAGNOSIS — F3342 Major depressive disorder, recurrent, in full remission: Secondary | ICD-10-CM | POA: Diagnosis not present

## 2022-05-13 DIAGNOSIS — I480 Paroxysmal atrial fibrillation: Secondary | ICD-10-CM | POA: Diagnosis not present

## 2022-05-13 DIAGNOSIS — I10 Essential (primary) hypertension: Secondary | ICD-10-CM | POA: Diagnosis not present

## 2022-05-13 DIAGNOSIS — F419 Anxiety disorder, unspecified: Secondary | ICD-10-CM | POA: Diagnosis not present

## 2022-05-13 DIAGNOSIS — G4733 Obstructive sleep apnea (adult) (pediatric): Secondary | ICD-10-CM | POA: Diagnosis not present

## 2022-05-13 DIAGNOSIS — E782 Mixed hyperlipidemia: Secondary | ICD-10-CM | POA: Diagnosis not present

## 2022-05-13 DIAGNOSIS — F3342 Major depressive disorder, recurrent, in full remission: Secondary | ICD-10-CM | POA: Diagnosis not present

## 2022-05-13 DIAGNOSIS — E559 Vitamin D deficiency, unspecified: Secondary | ICD-10-CM | POA: Diagnosis not present

## 2022-05-13 DIAGNOSIS — K219 Gastro-esophageal reflux disease without esophagitis: Secondary | ICD-10-CM | POA: Diagnosis not present

## 2022-05-13 DIAGNOSIS — M150 Primary generalized (osteo)arthritis: Secondary | ICD-10-CM | POA: Diagnosis not present

## 2022-05-15 DIAGNOSIS — F419 Anxiety disorder, unspecified: Secondary | ICD-10-CM | POA: Diagnosis not present

## 2022-05-15 DIAGNOSIS — K219 Gastro-esophageal reflux disease without esophagitis: Secondary | ICD-10-CM | POA: Diagnosis not present

## 2022-05-15 DIAGNOSIS — F3342 Major depressive disorder, recurrent, in full remission: Secondary | ICD-10-CM | POA: Diagnosis not present

## 2022-05-15 DIAGNOSIS — I480 Paroxysmal atrial fibrillation: Secondary | ICD-10-CM | POA: Diagnosis not present

## 2022-05-15 DIAGNOSIS — E782 Mixed hyperlipidemia: Secondary | ICD-10-CM | POA: Diagnosis not present

## 2022-05-15 DIAGNOSIS — G4733 Obstructive sleep apnea (adult) (pediatric): Secondary | ICD-10-CM | POA: Diagnosis not present

## 2022-05-15 DIAGNOSIS — I10 Essential (primary) hypertension: Secondary | ICD-10-CM | POA: Diagnosis not present

## 2022-05-15 DIAGNOSIS — M150 Primary generalized (osteo)arthritis: Secondary | ICD-10-CM | POA: Diagnosis not present

## 2022-05-15 DIAGNOSIS — E559 Vitamin D deficiency, unspecified: Secondary | ICD-10-CM | POA: Diagnosis not present

## 2022-05-18 DIAGNOSIS — F419 Anxiety disorder, unspecified: Secondary | ICD-10-CM | POA: Diagnosis not present

## 2022-05-18 DIAGNOSIS — E782 Mixed hyperlipidemia: Secondary | ICD-10-CM | POA: Diagnosis not present

## 2022-05-18 DIAGNOSIS — E559 Vitamin D deficiency, unspecified: Secondary | ICD-10-CM | POA: Diagnosis not present

## 2022-05-18 DIAGNOSIS — K219 Gastro-esophageal reflux disease without esophagitis: Secondary | ICD-10-CM | POA: Diagnosis not present

## 2022-05-18 DIAGNOSIS — I480 Paroxysmal atrial fibrillation: Secondary | ICD-10-CM | POA: Diagnosis not present

## 2022-05-18 DIAGNOSIS — I10 Essential (primary) hypertension: Secondary | ICD-10-CM | POA: Diagnosis not present

## 2022-05-18 DIAGNOSIS — M150 Primary generalized (osteo)arthritis: Secondary | ICD-10-CM | POA: Diagnosis not present

## 2022-05-18 DIAGNOSIS — F3342 Major depressive disorder, recurrent, in full remission: Secondary | ICD-10-CM | POA: Diagnosis not present

## 2022-05-18 DIAGNOSIS — G4733 Obstructive sleep apnea (adult) (pediatric): Secondary | ICD-10-CM | POA: Diagnosis not present

## 2022-05-20 DIAGNOSIS — E782 Mixed hyperlipidemia: Secondary | ICD-10-CM | POA: Diagnosis not present

## 2022-05-20 DIAGNOSIS — M150 Primary generalized (osteo)arthritis: Secondary | ICD-10-CM | POA: Diagnosis not present

## 2022-05-20 DIAGNOSIS — K219 Gastro-esophageal reflux disease without esophagitis: Secondary | ICD-10-CM | POA: Diagnosis not present

## 2022-05-20 DIAGNOSIS — E559 Vitamin D deficiency, unspecified: Secondary | ICD-10-CM | POA: Diagnosis not present

## 2022-05-20 DIAGNOSIS — F3342 Major depressive disorder, recurrent, in full remission: Secondary | ICD-10-CM | POA: Diagnosis not present

## 2022-05-20 DIAGNOSIS — I10 Essential (primary) hypertension: Secondary | ICD-10-CM | POA: Diagnosis not present

## 2022-05-20 DIAGNOSIS — I480 Paroxysmal atrial fibrillation: Secondary | ICD-10-CM | POA: Diagnosis not present

## 2022-05-20 DIAGNOSIS — G4733 Obstructive sleep apnea (adult) (pediatric): Secondary | ICD-10-CM | POA: Diagnosis not present

## 2022-05-20 DIAGNOSIS — F419 Anxiety disorder, unspecified: Secondary | ICD-10-CM | POA: Diagnosis not present

## 2022-05-21 DIAGNOSIS — Z4509 Encounter for adjustment and management of other cardiac device: Secondary | ICD-10-CM | POA: Diagnosis not present

## 2022-05-21 DIAGNOSIS — Z95818 Presence of other cardiac implants and grafts: Secondary | ICD-10-CM | POA: Diagnosis not present

## 2022-05-21 DIAGNOSIS — R55 Syncope and collapse: Secondary | ICD-10-CM | POA: Diagnosis not present

## 2022-05-27 DIAGNOSIS — F419 Anxiety disorder, unspecified: Secondary | ICD-10-CM | POA: Diagnosis not present

## 2022-05-27 DIAGNOSIS — G4733 Obstructive sleep apnea (adult) (pediatric): Secondary | ICD-10-CM | POA: Diagnosis not present

## 2022-05-27 DIAGNOSIS — I10 Essential (primary) hypertension: Secondary | ICD-10-CM | POA: Diagnosis not present

## 2022-05-27 DIAGNOSIS — I480 Paroxysmal atrial fibrillation: Secondary | ICD-10-CM | POA: Diagnosis not present

## 2022-05-27 DIAGNOSIS — M150 Primary generalized (osteo)arthritis: Secondary | ICD-10-CM | POA: Diagnosis not present

## 2022-05-27 DIAGNOSIS — E782 Mixed hyperlipidemia: Secondary | ICD-10-CM | POA: Diagnosis not present

## 2022-05-27 DIAGNOSIS — F3342 Major depressive disorder, recurrent, in full remission: Secondary | ICD-10-CM | POA: Diagnosis not present

## 2022-05-27 DIAGNOSIS — E559 Vitamin D deficiency, unspecified: Secondary | ICD-10-CM | POA: Diagnosis not present

## 2022-05-27 DIAGNOSIS — K219 Gastro-esophageal reflux disease without esophagitis: Secondary | ICD-10-CM | POA: Diagnosis not present

## 2022-05-29 DIAGNOSIS — F419 Anxiety disorder, unspecified: Secondary | ICD-10-CM | POA: Diagnosis not present

## 2022-05-29 DIAGNOSIS — E559 Vitamin D deficiency, unspecified: Secondary | ICD-10-CM | POA: Diagnosis not present

## 2022-05-29 DIAGNOSIS — I10 Essential (primary) hypertension: Secondary | ICD-10-CM | POA: Diagnosis not present

## 2022-05-29 DIAGNOSIS — E782 Mixed hyperlipidemia: Secondary | ICD-10-CM | POA: Diagnosis not present

## 2022-05-29 DIAGNOSIS — K219 Gastro-esophageal reflux disease without esophagitis: Secondary | ICD-10-CM | POA: Diagnosis not present

## 2022-05-29 DIAGNOSIS — M150 Primary generalized (osteo)arthritis: Secondary | ICD-10-CM | POA: Diagnosis not present

## 2022-05-29 DIAGNOSIS — F3342 Major depressive disorder, recurrent, in full remission: Secondary | ICD-10-CM | POA: Diagnosis not present

## 2022-05-29 DIAGNOSIS — G4733 Obstructive sleep apnea (adult) (pediatric): Secondary | ICD-10-CM | POA: Diagnosis not present

## 2022-05-29 DIAGNOSIS — I480 Paroxysmal atrial fibrillation: Secondary | ICD-10-CM | POA: Diagnosis not present

## 2022-06-02 DIAGNOSIS — K219 Gastro-esophageal reflux disease without esophagitis: Secondary | ICD-10-CM | POA: Diagnosis not present

## 2022-06-02 DIAGNOSIS — E039 Hypothyroidism, unspecified: Secondary | ICD-10-CM | POA: Diagnosis not present

## 2022-06-02 DIAGNOSIS — N390 Urinary tract infection, site not specified: Secondary | ICD-10-CM | POA: Diagnosis not present

## 2022-06-02 DIAGNOSIS — D508 Other iron deficiency anemias: Secondary | ICD-10-CM | POA: Diagnosis not present

## 2022-06-02 DIAGNOSIS — M159 Polyosteoarthritis, unspecified: Secondary | ICD-10-CM | POA: Diagnosis not present

## 2022-06-02 DIAGNOSIS — R7303 Prediabetes: Secondary | ICD-10-CM | POA: Diagnosis not present

## 2022-06-02 DIAGNOSIS — Z7901 Long term (current) use of anticoagulants: Secondary | ICD-10-CM | POA: Diagnosis not present

## 2022-06-02 DIAGNOSIS — E559 Vitamin D deficiency, unspecified: Secondary | ICD-10-CM | POA: Diagnosis not present

## 2022-06-02 DIAGNOSIS — M810 Age-related osteoporosis without current pathological fracture: Secondary | ICD-10-CM | POA: Diagnosis not present

## 2022-06-02 DIAGNOSIS — I10 Essential (primary) hypertension: Secondary | ICD-10-CM | POA: Diagnosis not present

## 2022-06-03 DIAGNOSIS — M150 Primary generalized (osteo)arthritis: Secondary | ICD-10-CM | POA: Diagnosis not present

## 2022-06-03 DIAGNOSIS — I480 Paroxysmal atrial fibrillation: Secondary | ICD-10-CM | POA: Diagnosis not present

## 2022-06-03 DIAGNOSIS — E559 Vitamin D deficiency, unspecified: Secondary | ICD-10-CM | POA: Diagnosis not present

## 2022-06-03 DIAGNOSIS — F3342 Major depressive disorder, recurrent, in full remission: Secondary | ICD-10-CM | POA: Diagnosis not present

## 2022-06-03 DIAGNOSIS — G4733 Obstructive sleep apnea (adult) (pediatric): Secondary | ICD-10-CM | POA: Diagnosis not present

## 2022-06-03 DIAGNOSIS — K219 Gastro-esophageal reflux disease without esophagitis: Secondary | ICD-10-CM | POA: Diagnosis not present

## 2022-06-03 DIAGNOSIS — E782 Mixed hyperlipidemia: Secondary | ICD-10-CM | POA: Diagnosis not present

## 2022-06-03 DIAGNOSIS — I10 Essential (primary) hypertension: Secondary | ICD-10-CM | POA: Diagnosis not present

## 2022-06-03 DIAGNOSIS — F419 Anxiety disorder, unspecified: Secondary | ICD-10-CM | POA: Diagnosis not present

## 2022-06-08 ENCOUNTER — Encounter (HOSPITAL_BASED_OUTPATIENT_CLINIC_OR_DEPARTMENT_OTHER): Payer: Self-pay | Admitting: Urology

## 2022-06-08 ENCOUNTER — Emergency Department (HOSPITAL_BASED_OUTPATIENT_CLINIC_OR_DEPARTMENT_OTHER)
Admission: EM | Admit: 2022-06-08 | Discharge: 2022-06-08 | Disposition: A | Discharge status: Home / Self Care | Payer: Medicare PPO | Attending: Emergency Medicine | Admitting: Emergency Medicine

## 2022-06-08 ENCOUNTER — Emergency Department (HOSPITAL_BASED_OUTPATIENT_CLINIC_OR_DEPARTMENT_OTHER): Payer: Medicare PPO

## 2022-06-08 DIAGNOSIS — S0993XA Unspecified injury of face, initial encounter: Secondary | ICD-10-CM | POA: Diagnosis present

## 2022-06-08 DIAGNOSIS — S0083XA Contusion of other part of head, initial encounter: Secondary | ICD-10-CM | POA: Diagnosis not present

## 2022-06-08 DIAGNOSIS — W01198A Fall on same level from slipping, tripping and stumbling with subsequent striking against other object, initial encounter: Secondary | ICD-10-CM | POA: Diagnosis not present

## 2022-06-08 DIAGNOSIS — I4891 Unspecified atrial fibrillation: Secondary | ICD-10-CM | POA: Diagnosis not present

## 2022-06-08 DIAGNOSIS — Z7901 Long term (current) use of anticoagulants: Secondary | ICD-10-CM | POA: Diagnosis not present

## 2022-06-08 DIAGNOSIS — Z79899 Other long term (current) drug therapy: Secondary | ICD-10-CM | POA: Insufficient documentation

## 2022-06-08 DIAGNOSIS — W19XXXA Unspecified fall, initial encounter: Secondary | ICD-10-CM

## 2022-06-08 DIAGNOSIS — S0990XA Unspecified injury of head, initial encounter: Secondary | ICD-10-CM | POA: Diagnosis not present

## 2022-06-08 NOTE — ED Triage Notes (Addendum)
Pt states fall last night at 2330 and hit left forehead Bruising noted to forehead  Denies any LOC with fall  A&O x 4, GCS 15   Pt takes Eliquis

## 2022-06-08 NOTE — Discharge Instructions (Addendum)
You are safe to resume your home dose as prescribed.  Your CT imaging was reassuring.  There was no other evidence of traumatic injury on your physical exam.  Your CT head did show evidence of enlarged ventricles.  If you develop headache, confusion, blurry vision, unsteady gait, urinary incontinence, this could be a sign of a condition called normal pressure hydrocephalus.  Follow-up with your PCP regarding these CT findings: IMPRESSION:  1. No acute intracranial hemorrhage or evidence of acute infarction.  2. Mild disproportionate enlargement of the ventricles relative to  the sulci with acute callosal angle and elevated Evans index. While  nonspecific, these findings can be seen in the setting of idiopathic  normal pressure hydrocephalus.

## 2022-06-08 NOTE — ED Provider Notes (Incomplete)
[facility] Provider Note   CSN: [id number] Arrival date & time: 06/08/22  1201     History {Add pertinent medical, surgical, social history, OB history to HPI:1} Chief Complaint  Patient presents with   Cynthia [name] is a 82 y.o. female.   Fall       Home Medications Prior to Admission medications   Medication Sig Start Date End Date Taking? Authorizing Provider  acetaminophen (TYLENOL) 500 MG tablet Take 1,000 mg by mouth at bedtime.    [provider]  Alpha-Lipoic Acid (LIPOIC ACID PO) Take 600 mg by mouth daily.    [provider]  amiodarone (PACERONE) 200 MG tablet Take 1 tablet (200 mg total) by mouth daily. 09/10/21   Cantwell, Celeste C, PA-C  apixaban (ELIQUIS) 5 MG TABS tablet Take 1 tablet (5 mg total) by mouth 2 (two) times daily. 04/15/22   Adrian Prows, MD  atorvastatin (LIPITOR) 10 MG tablet TAKE 1 TABLET BY MOUTH EVERY DAY 03/10/21   Cantwell, Celeste C, PA-C  AZO-CRANBERRY PO Take 1 tablet by mouth daily.    [provider]  Calcium Carbonate-Vitamin D3 600-400 MG-UNIT TABS Take 1 tablet by mouth daily.    [provider]  cycloSPORINE (RESTASIS) 0.05 % ophthalmic emulsion Place 1 drop into both eyes daily.    [provider]  denosumab (PROLIA) 60 MG/ML SOSY injection Inject 60 mg into the skin every 6 (six) months.    [provider]  desmopressin (DDAVP) 0.2 MG tablet Take 3 tablets by mouth as needed.    [provider]  diltiazem (CARDIZEM CD) 120 MG 24 hr capsule Take 1 capsule (120 mg total) by mouth daily. 07/02/21   Cantwell, Celeste C, PA-C  fluticasone (FLONASE) 50 MCG/ACT nasal spray Place 1 spray into the nose at bedtime.    [provider]  losartan (COZAAR) 100 MG tablet Take 50 mg by mouth every evening. 04/15/20   [provider]  meclizine (ANTIVERT) 25 MG tablet Take 25 mg by mouth daily as needed for dizziness.     [provider]  Melatonin 5 MG CAPS Take 5 mg by mouth at bedtime.     [provider]  metoprolol succinate (TOPROL-XL) 25 MG 24 hr tablet Take 1 tablet (25 mg total) by mouth daily. 04/24/22   Adrian Prows, MD  Multiple Vitamin (MULTIVITAMIN WITH MINERALS) TABS tablet Take 1-2 tablets by mouth daily. flintstone vitamins Chewable with Iron    [provider]  multivitamin-iron-minerals-folic acid (CENTRUM) chewable tablet Chew 1 tablet by mouth daily.    [provider]  nitroGLYCERIN (NITROLINGUAL) 0.4 MG/SPRAY spray Place 1 spray under the tongue every 5 (five) minutes x 3 doses as needed for chest pain. 03/19/21   Cantwell, Celeste C, PA-C  Omega-3 1000 MG CAPS Take 2,000 mg by mouth daily.     [provider]  ondansetron (ZOFRAN) 4 MG tablet Take 4 mg by mouth every 8 (eight) hours as needed for nausea or vomiting.    [provider]  pantoprazole (PROTONIX) 40 MG tablet Take 40 mg by mouth daily before breakfast.  06/12/14   [provider]  sertraline (ZOLOFT) 25 MG tablet Take 25 mg by mouth daily.    [provider]  thyroid (ARMOUR) 60 MG tablet Take 60 mg by mouth daily before breakfast. 11/07/17   [provider]  traMADol (ULTRAM) 50 MG tablet Take 1 tablet (  50 mg total) by mouth every 6 (six) hours as needed for severe pain. 06/08/21   Haus, Cordelia Pen, PA  traMADol (ULTRAM) 50 MG tablet Take 1 tablet (50 mg total) by mouth every 6 (six) hours as needed. 04/30/22   Charlesetta Shanks, MD  vitamin C (ASCORBIC ACID) 500 MG tablet Take 500 mg by mouth daily.    [provider]      Allergies    Flecainide, Oxycodone, Trimethoprim, Alendronate sodium, Avelox [moxifloxacin hcl in nacl], Ciprofloxacin, and Other    Review of Systems   Review of Systems  Physical Exam Updated Vital Signs BP 118/73 (BP Location: Left Arm)   Pulse 65   Temp 98.4 F (36.9 C) (Oral)   Resp 16   Ht '5\' 5"'$  (1.651 m)   Wt  70.3 kg   SpO2 96%   BMI 25.79 kg/m  Physical Exam  ED Results / Procedures / Treatments   Labs (all labs ordered are listed, but only abnormal results are displayed) Labs Reviewed - No data to display  EKG None  Radiology CT Head Wo Contrast  Result Date: 06/08/2022 CLINICAL DATA:  Head trauma. Fell last night and hit forehead above left eye. EXAM: CT HEAD WITHOUT CONTRAST TECHNIQUE: Contiguous axial images were obtained from the base of the skull through the vertex without intravenous contrast. RADIATION DOSE REDUCTION: This exam was performed according to the departmental dose-optimization program which includes automated exposure control, adjustment of the mA and/or kV according to patient size and/or use of iterative reconstruction technique. COMPARISON:  Head CTA 923. FINDINGS: Brain: No acute intracranial hemorrhage. Gray-white differentiation is preserved. Mild disproportionate enlargement of the ventricles relative to the sulci with acute callosal angle and elevated Evans index. No extra-axial collection. Vascular: No hyperdense vessel or unexpected calcification. Skull: Prior left retrosigmoid craniotomy. Sinuses/Orbits: Unremarkable. Other: None. IMPRESSION: 1. No acute intracranial hemorrhage or evidence of acute infarction. 2. Mild disproportionate enlargement of the ventricles relative to the sulci with acute callosal angle and elevated Evans index. While nonspecific, these findings can be seen in the setting of idiopathic normal pressure hydrocephalus. Electronically Signed   By: Emmit Alexanders M.D.   On: 06/08/2022 13:02    Procedures Procedures  {Document cardiac monitor, telemetry assessment procedure when appropriate:1}  Medications Ordered in ED Medications - No data to display  ED Course/ Medical Decision Making/ A&P   {   Click here for ABCD2, HEART and other calculatorsREFRESH Note before signing :1}                          Medical Decision Making Amount  and/or Complexity of Data Reviewed Radiology: ordered.   ***  {Document critical care time when appropriate:1} {Document review of labs and clinical decision tools ie heart score, Chads2Vasc2 etc:1}  {Document your independent review of radiology images, and any outside records:1} {Document your discussion with family members, caretakers, and with consultants:1} {Document social determinants of health affecting pt's care:1} {Document your decision making why or why not admission, treatments were needed:1} Final Clinical Impression(s) / ED Diagnoses Final diagnoses:  None    Rx / DC Orders ED Discharge Orders     None

## 2022-06-09 DIAGNOSIS — F3342 Major depressive disorder, recurrent, in full remission: Secondary | ICD-10-CM | POA: Diagnosis not present

## 2022-06-09 DIAGNOSIS — I480 Paroxysmal atrial fibrillation: Secondary | ICD-10-CM | POA: Diagnosis not present

## 2022-06-09 DIAGNOSIS — E782 Mixed hyperlipidemia: Secondary | ICD-10-CM | POA: Diagnosis not present

## 2022-06-09 DIAGNOSIS — G4733 Obstructive sleep apnea (adult) (pediatric): Secondary | ICD-10-CM | POA: Diagnosis not present

## 2022-06-09 DIAGNOSIS — I10 Essential (primary) hypertension: Secondary | ICD-10-CM | POA: Diagnosis not present

## 2022-06-09 DIAGNOSIS — F419 Anxiety disorder, unspecified: Secondary | ICD-10-CM | POA: Diagnosis not present

## 2022-06-09 DIAGNOSIS — K219 Gastro-esophageal reflux disease without esophagitis: Secondary | ICD-10-CM | POA: Diagnosis not present

## 2022-06-09 DIAGNOSIS — M150 Primary generalized (osteo)arthritis: Secondary | ICD-10-CM | POA: Diagnosis not present

## 2022-06-09 DIAGNOSIS — E559 Vitamin D deficiency, unspecified: Secondary | ICD-10-CM | POA: Diagnosis not present

## 2022-06-15 ENCOUNTER — Other Ambulatory Visit: Payer: Self-pay

## 2022-06-15 ENCOUNTER — Encounter: Payer: Self-pay | Admitting: Physical Therapy

## 2022-06-15 ENCOUNTER — Ambulatory Visit: Payer: Medicare PPO | Attending: Registered Nurse | Admitting: Physical Therapy

## 2022-06-15 DIAGNOSIS — R2689 Other abnormalities of gait and mobility: Secondary | ICD-10-CM | POA: Diagnosis not present

## 2022-06-15 DIAGNOSIS — M6281 Muscle weakness (generalized): Secondary | ICD-10-CM

## 2022-06-15 DIAGNOSIS — R296 Repeated falls: Secondary | ICD-10-CM | POA: Diagnosis not present

## 2022-06-15 NOTE — Therapy (Signed)
OUTPATIENT PHYSICAL THERAPY EVALUATION   Patient Name: Cynthia [name] MRN: [id number] DOB:10-Sep-1940, 82 y.o., female Today's Date: 06/15/2022   END OF SESSION:  PT End of Session - 06/15/22 1307     Visit Number 1    Number of Visits 17    Date for PT Re-Evaluation 08/10/22    Authorization Type [employer] MCR    PT Start Time 1100    PT Stop Time 1145    PT Time Calculation (min) 45 min    Equipment Utilized During Treatment Gait belt    Activity Tolerance Patient tolerated treatment well    Behavior During Therapy WFL for tasks assessed/performed             Past Medical History:  Diagnosis Date   Acute GI bleeding 10/27/2017   Arthritis    "joints" (11/10/2017)   Dizziness    Dysrhythmia    afib   Encounter for loop recorder check 06/09/2020   GERD (gastroesophageal reflux disease)    GI bleed 11/06/2017   Hearing loss    History of blood transfusion 10/2017   Hyperlipidemia    Hypertension    Hyperthyroidism    Loop recorder Biotronik Loop Recorder 05/23/2020 05/23/2020   Melanoma ([place])    "cut off my back"   Memory loss    Pneumonia    Sleep apnea    cpap   Syncope and collapse    Past Surgical History:  Procedure Laterality Date   ABDOMINAL HYSTERECTOMY     "partial"   APPENDECTOMY     BIOPSY  11/17/2017   Procedure: BIOPSY;  Surgeon: Clarene Essex, MD;  Location: [facility];  Service: Endoscopy;;   COLONOSCOPY WITH PROPOFOL N/A 11/17/2017   Procedure: COLONOSCOPY WITH PROPOFOL;  Surgeon: Clarene Essex, MD;  Location: [facility];  Service: Endoscopy;  Laterality: N/A;   CRANIECTOMY FOR EXCISION OF ACOUSTIC NEUROMA     ESOPHAGOGASTRODUODENOSCOPY (EGD) WITH PROPOFOL N/A 10/28/2017   Procedure: ESOPHAGOGASTRODUODENOSCOPY (EGD) WITH PROPOFOL;  Surgeon: Clarene Essex, MD;  Location: [facility];  Service: Endoscopy;  Laterality: N/A;   FOOT SURGERY Bilateral 04/18/2020   KNEE ARTHROPLASTY Right 06/05/2021   Procedure: COMPUTER ASSISTED TOTAL KNEE ARTHROPLASTY;   Surgeon: Rod Can, MD;  Location: [facility];  Service: Orthopedics;  Laterality: Right;   KNEE ARTHROSCOPY Left    LEFT HEART CATH AND CORONARY ANGIOGRAPHY N/A 11/24/2016   Procedure: LEFT HEART CATH AND CORONARY ANGIOGRAPHY;  Surgeon: Jettie Booze, MD;  Location: [facility];  Service: Cardiovascular;  Laterality: N/A;   MELANOMA EXCISION     "off my back"   NASAL SINUS SURGERY     TONSILLECTOMY     Patient Active Problem List   Diagnosis Date Noted   Degenerative arthritis of right knee 06/05/2021   Osteoarthritis of right knee 06/05/2021   NPH (normal pressure hydrocephalus) ([place]) 08/19/2020   Urinary urgency 08/19/2020   Encounter for loop recorder check 06/09/2020   Loop recorder Biotronik Loop Recorder 05/23/2020 05/23/2020   Paroxysmal A-fib ([place]) 03/24/2020   Gait disturbance 01/29/2020   Periodic limb movement disorder 08/30/2019   Nocturia 07/01/2018   Midline cystocele 06/08/2018   Recurrent UTI 06/08/2018   Vaginal vault prolapse 06/08/2018   Syncope and collapse    Postural dizziness with presyncope 03/17/2018   PAF (paroxysmal atrial fibrillation) ([place]) 03/17/2018   Memory loss 11/25/2017   Abnormal stress test    Hyperlipidemia 11/23/2016   GERD (gastroesophageal reflux disease) 11/23/2016   Polyneuropathy 10/30/2016  Bursitis, subacromial 10/30/2016   Numbness 10/30/2016   Arthritis, senescent 01/03/2016   Pain in joint, ankle and foot 02/28/2015   Dry mouth 08/07/2014   Dry eyes 08/07/2014   Other headache syndrome 06/25/2014   Sinusitis, chronic 06/25/2014   Neck pain 06/25/2014   OSA on CPAP 06/25/2014   Essential hypertension, benign 06/25/2014    PCP: Deland Pretty, MD  REFERRING PROVIDER: Holland Commons, FNP  REFERRING DIAG: Poor balance, Unsteady gait  THERAPY DIAG:  Repeated falls  Muscle weakness (generalized)  Other abnormalities of gait and mobility  Rationale for Evaluation and Treatment:  Rehabilitation  ONSET DATE: ongoing many years   SUBJECTIVE:  SUBJECTIVE STATEMENT: Patient reports balance issues and frequent falls. She has fallen twice this year and she feels like her balance is getting worse. She walks with a walker, has been using it for a while, and she was using a cane some but was told a week ago that she should be using a walker at all time. With her most recent fall, she fell and hit her head in the bathroom at night. She does have a history of lower back pain that is constant. She does continue to do housework and sweeps, and the low back pain can become more intense.   PERTINENT HISTORY: See PMH above  PAIN:  Are you having pain? Yes:  NPRS scale: 5/10 Pain location: Low back Pain description: Aching Aggravating factors: Sweeping, standing, walking Relieving factors: Rest  PRECAUTIONS: Fall  WEIGHT BEARING RESTRICTIONS: No  FALLS:  Has patient fallen in last 6 months? Yes. Number of falls 4  LIVING ENVIRONMENT: Lives with: lives with their spouse Lives in: House/apartment Stairs: Yes: External: 2 steps; none Has following equipment at home: Walker - 2 wheeled  PLOF: Independent with basic ADLs  PATIENT GOALS: Decrease falls, improve balance and walking   OBJECTIVE:  PATIENT SURVEYS:  FOTO 36% functional status  COGNITION: Overall cognitive status: Within functional limits for tasks assessed     SENSATION: WFL  POSTURE:   Rounded shoulders, generally kyphotic  LOWER EXTREMITY MMT:  MMT Right eval Left eval  Hip flexion 4- 4-  Hip extension 2 2  Hip abduction 3 3  Hip adduction    Hip internal rotation    Hip external rotation    Knee flexion 4- 4  Knee extension 4- 4  Ankle dorsiflexion 4 4  Ankle plantarflexion 2 2  Ankle inversion    Ankle eversion     (Blank rows = not tested)  FUNCTIONAL TESTS:  5 times sit to stand: 22 seconds Timed up and go (TUG): 41 seconds with RW 2 minute walk test: 113 ft with RW Berg  Balance Scale: see below  BERG BALANCE TEST Sitting to Standing: 4.      Stands without using hands and stabilize independently Standing Unsupported: 2.      Stands 30 seconds unsupported Sitting Unsupported: 4.     Sits for 2 minutes independently Standing to Sitting: 4.     Sits safely with minimal use of hands Transfers: 4.     Transfers safely with minor use of hands Standing with eyes closed: 3.     Stands 10 seconds with supervision Standing with feet together: 3.     Stands for 1 minute with supervision Reaching forward with outstretched arm: 3.     Reaches forward 5 inches Retrieving object from the floor: 2.     Unable to pick up, but reaches within 1-2  inches independently Turning to look behind: 2.     Turns sideways only, maintains balance Turning 360 degrees: 1.     Needs supervision or verbal cueing Place alternate foot on stool: 1.     Completes >2 steps with minimal assist Standing with one foot in front: 0.     Loses balance while standing/stepping Standing on one foot: 1.     Holds <3 seconds Total Score: 34/56  GAIT: Distance walked: 113 ft Assistive device utilized: Environmental consultant - 2 wheeled Level of assistance: Modified independence Comments: Decreased gait speed, bilateral knee flexion throughout gait,    TODAY'S TREATMENT:    [facility] Adult PT Treatment:                                                DATE: 06/15/2022 Therapeutic Exercise: Sit to stand 2 x 5 LAQ x 20 each Standing hip abduction x 10 each Standing march x 20 Heel raises x 10  PATIENT EDUCATION:  Education details: Exam findings, POC, HEP Person educated: Patient Education method: Explanation, Demonstration, Tactile cues, Verbal cues, and Handouts Education comprehension: verbalized understanding, returned demonstration, verbal cues required, tactile cues required, and needs further education  HOME EXERCISE PROGRAM: Access Code: [email or web address]    ASSESSMENT: CLINICAL IMPRESSION: Patient is a 82 y.o.  female who was seen today for physical therapy evaluation and treatment for frequent falls due to balance, gait, and strength deficits.    OBJECTIVE IMPAIRMENTS: Abnormal gait, decreased activity tolerance, decreased balance, decreased endurance, difficulty walking, decreased strength, improper body mechanics, postural dysfunction, and pain.   ACTIVITY LIMITATIONS: carrying, lifting, bending, standing, squatting, stairs, transfers, dressing, hygiene/grooming, and locomotion level  PARTICIPATION LIMITATIONS: meal prep, cleaning, laundry, shopping, and community activity  PERSONAL FACTORS: Age, Fitness, Past/current experiences, and Time since onset of injury/illness/exacerbation are also affecting patient's functional outcome.   REHAB POTENTIAL: Good  CLINICAL DECISION MAKING: Stable/uncomplicated  EVALUATION COMPLEXITY: Low   GOALS: Goals reviewed with patient? Yes  SHORT TERM GOALS: Target date: 07/13/2022  Patient will be I with initial HEP in order to progress with therapy. Baseline: HEP provided at eval Goal status: INITIAL  2.  PT will review FOTO with patient by 3rd visit in order to understand expected progress and outcome with therapy. Baseline: FOTO assessed at eval Goal status: INITIAL  LONG TERM GOALS: Target date: 08/10/2022  Patient will be I with final HEP to maintain progress from PT. Baseline: HEP provided at eval Goal status: INITIAL  2.  Patient will report >/= 46% status on FOTO to indicate improved functional ability. Baseline: 36% functional status Goal status: INITIAL  3.  Patient will demonstrate BERG >/= 42/56 to indicate improved balance and reduced fall risk Baseline: 34/56 Goal status: INITIAL  4.  Patient will demonstrate 5xSTS </= 15 seconds to indicate improved LE strength and reduced fall risk Baseline:  Goal status: INITIAL  5.  Patient will perform 2MWT >/= 185 ft using LRAD in order to improve household mobility Baseline: 113 ft using  RW Goal status: INITIAL  6.  Patient will perform TUG </= 20 seconds using LRAD to indicate improved mobility and reduced fall risk Baseline: 41 seconds using RW Goal status: INITIAL   PLAN: PT FREQUENCY: 1-2x/week  PT DURATION: 8 weeks  PLANNED INTERVENTIONS: Therapeutic exercises, Therapeutic activity, Neuromuscular re-education, Balance training, Gait training, Patient/Family education, Self Care,  Joint mobilization, Aquatic Therapy, Cryotherapy, Moist heat, Manual therapy, and Re-evaluation  PLAN FOR NEXT SESSION: Review HEP and progress PRN, LE strengthening, initiate balance training   Hilda Blades, PT, DPT, LAT, ATC 06/15/22  1:11 PM Phone: [phone] Fax: [phone]   Referring diagnosis? R26.89  Treatment diagnosis? (if different than referring diagnosis) R29.6 What was this (referring dx) caused by? '[]'$  Surgery '[x]'$  Fall '[x]'$  Ongoing issue '[]'$  Arthritis '[]'$  Other: ____________  Laterality: '[]'$  Rt '[]'$  Lt '[x]'$  Both  Check all possible CPT codes:  *CHOOSE 10 OR LESS*    '[]'$  97110 (Therapeutic Exercise)  '[]'$  92507 (SLP Treatment)  '[]'$  97112 (Neuro Re-ed)   '[]'$  92526 (Swallowing Treatment)   '[]'$  97116 (Gait Training)   '[]'$  [id number] (Cognitive Training, 1st 15 minutes) '[]'$  97140 (Manual Therapy)   '[]'$  97130 (Cognitive Training, each add'l 15 minutes)  '[]'$  97164 (Re-evaluation)                              '[]'$  Other, List CPT Code ____________  '[]'$  97530 (Therapeutic Activities)     '[]'$  97535 (Self Care)   '[x]'$  All codes above (97110 - 97535)  '[]'$  97012 (Mechanical Traction)  '[]'$  97014 (E-stim Unattended)  '[]'$  97032 (E-stim manual)  '[]'$  97033 (Ionto)  '[]'$  97035 (Ultrasound) '[]'$  97750 (Physical Performance Training) '[x]'$  [id number] (Aquatic Therapy) '[]'$  97016 (Vasopneumatic Device) '[]'$  [id number] (Paraffin) '[]'$  97034 (Contrast Bath) '[]'$  97597 (Wound Care 1st 20 sq cm) '[]'$  97598 (Wound Care each add'l 20 sq cm) '[]'$  97760 (Orthotic Fabrication, Fitting, Training Initial) '[]'$  [id number] (Prosthetic  Management and Training Initial) '[]'$  [id number] (Orthotic or Prosthetic Training/ Modification Subsequent)

## 2022-06-15 NOTE — Patient Instructions (Signed)
Access Code: [email or web address] URL: [email or web address] Date: 06/15/2022 Prepared by: Hilda Blades  Exercises - Sit to Stand  - 3 x daily - 10 reps - Seated Long Arc Quad  - 3 x daily - 20 reps - Standing Hip Abduction with Counter Support  - 3 x daily - 10 reps - Standing March with Counter Support  - 3 x daily - 20 reps - Heel Raises with Counter Support  - 3 x daily - 10 reps

## 2022-06-21 DIAGNOSIS — Z4509 Encounter for adjustment and management of other cardiac device: Secondary | ICD-10-CM | POA: Diagnosis not present

## 2022-06-21 DIAGNOSIS — R55 Syncope and collapse: Secondary | ICD-10-CM | POA: Diagnosis not present

## 2022-06-21 DIAGNOSIS — Z95818 Presence of other cardiac implants and grafts: Secondary | ICD-10-CM | POA: Diagnosis not present

## 2022-06-25 ENCOUNTER — Encounter: Payer: Self-pay | Admitting: Physical Therapy

## 2022-06-25 ENCOUNTER — Ambulatory Visit: Payer: Medicare PPO | Admitting: Physical Therapy

## 2022-06-25 ENCOUNTER — Other Ambulatory Visit: Payer: Self-pay

## 2022-06-25 DIAGNOSIS — M6281 Muscle weakness (generalized): Secondary | ICD-10-CM | POA: Diagnosis not present

## 2022-06-25 DIAGNOSIS — R2689 Other abnormalities of gait and mobility: Secondary | ICD-10-CM

## 2022-06-25 DIAGNOSIS — R296 Repeated falls: Secondary | ICD-10-CM

## 2022-06-25 NOTE — Therapy (Signed)
OUTPATIENT PHYSICAL THERAPY TREATMENT NOTE   Patient Name: Cynthia [name] MRN: [id number] DOB:28-Feb-1941, 82 y.o., female Today's Date: 06/25/2022  PCP: Deland Pretty, MD REFERRING PROVIDER: Holland Commons, FNP   END OF SESSION:   PT End of Session - 06/25/22 1419     Visit Number 2    Number of Visits 17    Date for PT Re-Evaluation 08/10/22    Authorization Type [employer] MCR    Authorization Time Period 06/25/2022 - 08/08/2022    Authorization - Visit Number 1    Authorization - Number of Visits 12    PT Start Time [id number]    PT Stop Time 1445    PT Time Calculation (min) 34 min    Activity Tolerance Patient tolerated treatment well    Behavior During Therapy [facility] for tasks assessed/performed             Past Medical History:  Diagnosis Date   Acute GI bleeding 10/27/2017   Arthritis    "joints" (11/10/2017)   Dizziness    Dysrhythmia    afib   Encounter for loop recorder check 06/09/2020   GERD (gastroesophageal reflux disease)    GI bleed 11/06/2017   Hearing loss    History of blood transfusion 10/2017   Hyperlipidemia    Hypertension    Hyperthyroidism    Loop recorder Biotronik Loop Recorder 05/23/2020 05/23/2020   Melanoma ([place])    "cut off my back"   Memory loss    Pneumonia    Sleep apnea    cpap   Syncope and collapse    Past Surgical History:  Procedure Laterality Date   ABDOMINAL HYSTERECTOMY     "partial"   APPENDECTOMY     BIOPSY  11/17/2017   Procedure: BIOPSY;  Surgeon: Clarene Essex, MD;  Location: [facility];  Service: Endoscopy;;   COLONOSCOPY WITH PROPOFOL N/A 11/17/2017   Procedure: COLONOSCOPY WITH PROPOFOL;  Surgeon: Clarene Essex, MD;  Location: [facility];  Service: Endoscopy;  Laterality: N/A;   CRANIECTOMY FOR EXCISION OF ACOUSTIC NEUROMA     ESOPHAGOGASTRODUODENOSCOPY (EGD) WITH PROPOFOL N/A 10/28/2017   Procedure: ESOPHAGOGASTRODUODENOSCOPY (EGD) WITH PROPOFOL;  Surgeon: Clarene Essex, MD;  Location: [facility];  Service:  Endoscopy;  Laterality: N/A;   FOOT SURGERY Bilateral 04/18/2020   KNEE ARTHROPLASTY Right 06/05/2021   Procedure: COMPUTER ASSISTED TOTAL KNEE ARTHROPLASTY;  Surgeon: Rod Can, MD;  Location: [facility];  Service: Orthopedics;  Laterality: Right;   KNEE ARTHROSCOPY Left    LEFT HEART CATH AND CORONARY ANGIOGRAPHY N/A 11/24/2016   Procedure: LEFT HEART CATH AND CORONARY ANGIOGRAPHY;  Surgeon: Jettie Booze, MD;  Location: [place] [facility];  Service: Cardiovascular;  Laterality: N/A;   MELANOMA EXCISION     "off my back"   NASAL SINUS SURGERY     TONSILLECTOMY     Patient Active Problem List   Diagnosis Date Noted   Degenerative arthritis of right knee 06/05/2021   Osteoarthritis of right knee 06/05/2021   NPH (normal pressure hydrocephalus) ([place]) 08/19/2020   Urinary urgency 08/19/2020   Encounter for loop recorder check 06/09/2020   Loop recorder Biotronik Loop Recorder 05/23/2020 05/23/2020   Paroxysmal A-fib ([place]) 03/24/2020   Gait disturbance 01/29/2020   Periodic limb movement disorder 08/30/2019   Nocturia 07/01/2018   Midline cystocele 06/08/2018   Recurrent UTI 06/08/2018   Vaginal vault prolapse 06/08/2018   Syncope and collapse    Postural dizziness with presyncope 03/17/2018   PAF (paroxysmal atrial fibrillation) (  [place]) 03/17/2018   Memory loss 11/25/2017   Abnormal stress test    Hyperlipidemia 11/23/2016   GERD (gastroesophageal reflux disease) 11/23/2016   Polyneuropathy 10/30/2016   Bursitis, subacromial 10/30/2016   Numbness 10/30/2016   Arthritis, senescent 01/03/2016   Pain in joint, ankle and foot 02/28/2015   Dry mouth 08/07/2014   Dry eyes 08/07/2014   Other headache syndrome 06/25/2014   Sinusitis, chronic 06/25/2014   Neck pain 06/25/2014   OSA on CPAP 06/25/2014   Essential hypertension, benign 06/25/2014    REFERRING DIAG: Poor balance, Unsteady gait   THERAPY DIAG:  Repeated falls  Muscle weakness (generalized)  Other  abnormalities of gait and mobility  Rationale for Evaluation and Treatment Rehabilitation  PERTINENT HISTORY: See PMH above   PRECAUTIONS: Fall    SUBJECTIVE:                                                                                                                                                                                     SUBJECTIVE STATEMENT:  Patient reports she has done her exercises some but probably not as much as she should. She states her back was feeling horrible earlier today but she took some medication and is feeling better.  PAIN:  Are you having pain? Yes:  NPRS scale: 5/10 Pain location: Low back Pain description: Aching Aggravating factors: Sweeping, standing, walking Relieving factors: Rest   OBJECTIVE: (objective measures completed at initial evaluation unless otherwise dated) PATIENT SURVEYS:  FOTO 36% functional status   POSTURE:             Rounded shoulders, generally kyphotic   LOWER EXTREMITY MMT:   MMT Right eval Left eval Rt / Lt 06/25/2022  Hip flexion 4- 4-   Hip extension 2 2   Hip abduction 3 3   Hip adduction       Hip internal rotation       Hip external rotation       Knee flexion 4- 4 4- / 4  Knee extension 4- 4 4- / 4  Ankle dorsiflexion 4 4   Ankle plantarflexion 2 2   Ankle inversion       Ankle eversion        (Blank rows = not tested)   FUNCTIONAL TESTS:  5 times sit to stand: 22 seconds Timed up and go (TUG): 41 seconds with RW 2 minute walk test: 113 ft with RW Berg Balance Scale: 34/56 (see eval for results)   GAIT: Distance walked: 113 ft Assistive device utilized: Walker - 2 wheeled Level of assistance: Modified independence Comments: Decreased gait speed, bilateral knee flexion throughout gait,  TODAY'S TREATMENT:    [facility] Adult PT Treatment:                                                DATE: 06/25/2022 Therapeutic Exercise: NuStep L4 x 5 min with LE while taking subjective LAQ with 2# 2 x  20 each Seated alternating march with 2# 2 x 20 Seated clamshell with red 2 x 20 Sit to stand 2 x 5 Seated hamstring curl with yellow 2 x 10 each Standing hip abduction x 10 each Standing march x 20 Heel raises x 10 Romberg on Airex 3 x 60 sec   [facility] Adult PT Treatment:                                                DATE: 06/15/2022 Therapeutic Exercise: Sit to stand 2 x 5 LAQ x 20 each Standing hip abduction x 10 each Standing march x 20 Heel raises x 10   PATIENT EDUCATION:  Education details: HEP Person educated: Patient Education method: Consulting civil engineer, Demonstration, Corporate treasurer cues, Verbal cues Education comprehension: verbalized understanding, returned demonstration, verbal cues required, tactile cues required, and needs further education   HOME EXERCISE PROGRAM: Access Code: [id number]      ASSESSMENT: CLINICAL IMPRESSION: Patient tolerated therapy well with no adverse effects. Therapy limited on time due to patiet arriving late. Therapy focused on progressing LE strength and initiating balance training with good tolerance. She did require occasional UE support with romberg stance on foam surface and fatigued quickly with sit to stand exercises where she began to have difficulty with control lowering to the chair. She requires cueing for posture with exercises and with walking. No changes made to HEP this visit. Patient would benefit from continued skilled PT to progress her strength and balance in order to improve walking and reduce fall risk.     OBJECTIVE IMPAIRMENTS: Abnormal gait, decreased activity tolerance, decreased balance, decreased endurance, difficulty walking, decreased strength, improper body mechanics, postural dysfunction, and pain.    ACTIVITY LIMITATIONS: carrying, lifting, bending, standing, squatting, stairs, transfers, dressing, hygiene/grooming, and locomotion level   PARTICIPATION LIMITATIONS: meal prep, cleaning, laundry, shopping, and community activity    PERSONAL FACTORS: Age, Fitness, Past/current experiences, and Time since onset of injury/illness/exacerbation are also affecting patient's functional outcome.      GOALS: Goals reviewed with patient? Yes   SHORT TERM GOALS: Target date: 07/13/2022   Patient will be I with initial HEP in order to progress with therapy. Baseline: HEP provided at eval Goal status: INITIAL   2.  PT will review FOTO with patient by 3rd visit in order to understand expected progress and outcome with therapy. Baseline: FOTO assessed at eval Goal status: INITIAL   LONG TERM GOALS: Target date: 08/10/2022   Patient will be I with final HEP to maintain progress from PT. Baseline: HEP provided at eval Goal status: INITIAL   2.  Patient will report >/= 46% status on FOTO to indicate improved functional ability. Baseline: 36% functional status Goal status: INITIAL   3.  Patient will demonstrate BERG >/= 42/56 to indicate improved balance and reduced fall risk Baseline: 34/56 Goal status: INITIAL   4.  Patient will demonstrate 5xSTS </= 15 seconds to  indicate improved LE strength and reduced fall risk Baseline: 22 seconds Goal status: INITIAL   5.  Patient will perform 2MWT >/= 185 ft using LRAD in order to improve household mobility Baseline: 113 ft using RW Goal status: INITIAL   6.  Patient will perform TUG </= 20 seconds using LRAD to indicate improved mobility and reduced fall risk Baseline: 41 seconds using RW Goal status: INITIAL     PLAN: PT FREQUENCY: 1-2x/week   PT DURATION: 8 weeks   PLANNED INTERVENTIONS: Therapeutic exercises, Therapeutic activity, Neuromuscular re-education, Balance training, Gait training, Patient/Family education, Self Care, Joint mobilization, Aquatic Therapy, Cryotherapy, Moist heat, Manual therapy, and Re-evaluation   PLAN FOR NEXT SESSION: Review HEP and progress PRN, LE strengthening, balance training   Hilda Blades, PT, DPT, LAT, ATC 06/25/22  3:41  PM Phone: [phone] Fax: [phone]

## 2022-06-29 ENCOUNTER — Ambulatory Visit: Payer: Medicare PPO | Admitting: Physical Therapy

## 2022-06-29 ENCOUNTER — Encounter: Payer: Self-pay | Admitting: Physical Therapy

## 2022-06-29 DIAGNOSIS — R296 Repeated falls: Secondary | ICD-10-CM

## 2022-06-29 DIAGNOSIS — R2689 Other abnormalities of gait and mobility: Secondary | ICD-10-CM

## 2022-06-29 DIAGNOSIS — M6281 Muscle weakness (generalized): Secondary | ICD-10-CM

## 2022-06-29 NOTE — Therapy (Signed)
OUTPATIENT PHYSICAL THERAPY TREATMENT NOTE   Patient Name: Cynthia [name] MRN: [id number] DOB:15-Feb-1941, 82 y.o., female Today's Date: 06/29/2022  PCP: Deland Pretty, MD REFERRING PROVIDER: Holland Commons, FNP   END OF SESSION:   PT End of Session - 06/29/22 1327     Visit Number 3    Number of Visits 17    Date for PT Re-Evaluation 08/10/22    Authorization Type [employer] MCR    Authorization Time Period 06/25/2022 - 08/08/2022    Authorization - Visit Number 2    Authorization - Number of Visits 12    PT Start Time [id number]   pt late, then to restroom prior to start of session.   PT Stop Time 1355    PT Time Calculation (min) 30 min             Past Medical History:  Diagnosis Date   Acute GI bleeding 10/27/2017   Arthritis    "joints" (11/10/2017)   Dizziness    Dysrhythmia    afib   Encounter for loop recorder check 06/09/2020   GERD (gastroesophageal reflux disease)    GI bleed 11/06/2017   Hearing loss    History of blood transfusion 10/2017   Hyperlipidemia    Hypertension    Hyperthyroidism    Loop recorder Biotronik Loop Recorder 05/23/2020 05/23/2020   Melanoma ([place])    "cut off my back"   Memory loss    Pneumonia    Sleep apnea    cpap   Syncope and collapse    Past Surgical History:  Procedure Laterality Date   ABDOMINAL HYSTERECTOMY     "partial"   APPENDECTOMY     BIOPSY  11/17/2017   Procedure: BIOPSY;  Surgeon: Clarene Essex, MD;  [facility];  Service: Endoscopy;;   COLONOSCOPY WITH PROPOFOL N/A 11/17/2017   Procedure: COLONOSCOPY WITH PROPOFOL;  Surgeon: Clarene Essex, MD;  [facility];  Service: Endoscopy;  Laterality: N/A;   CRANIECTOMY FOR EXCISION OF ACOUSTIC NEUROMA     ESOPHAGOGASTRODUODENOSCOPY (EGD) WITH PROPOFOL N/A 10/28/2017   Procedure: ESOPHAGOGASTRODUODENOSCOPY (EGD) WITH PROPOFOL;  Surgeon: Clarene Essex, MD;  [facility];  Service: Endoscopy;  Laterality: N/A;   FOOT SURGERY Bilateral 04/18/2020    KNEE ARTHROPLASTY Right 06/05/2021   Procedure: COMPUTER ASSISTED TOTAL KNEE ARTHROPLASTY;  Surgeon: Rod Can, MD;  [facility];  Service: Orthopedics;  Laterality: Right;   KNEE ARTHROSCOPY Left    LEFT HEART CATH AND CORONARY ANGIOGRAPHY N/A 11/24/2016   Procedure: LEFT HEART CATH AND CORONARY ANGIOGRAPHY;  Surgeon: Jettie Booze, MD;  Location: [place] [facility];  Service: Cardiovascular;  Laterality: N/A;   MELANOMA EXCISION     "off my back"   NASAL SINUS SURGERY     TONSILLECTOMY     Patient Active Problem List   Diagnosis Date Noted   Degenerative arthritis of right knee 06/05/2021   Osteoarthritis of right knee 06/05/2021   NPH (normal pressure hydrocephalus) ([place]) 08/19/2020   Urinary urgency 08/19/2020   Encounter for loop recorder check 06/09/2020   Loop recorder Biotronik Loop Recorder 05/23/2020 05/23/2020   Paroxysmal A-fib ([place]) 03/24/2020   Gait disturbance 01/29/2020   Periodic limb movement disorder 08/30/2019   Nocturia 07/01/2018   Midline cystocele 06/08/2018   Recurrent UTI 06/08/2018   Vaginal vault prolapse 06/08/2018   Syncope and collapse    Postural dizziness with presyncope 03/17/2018   PAF (paroxysmal atrial fibrillation) ([place]) 03/17/2018   Memory loss 11/25/2017  Abnormal stress test    Hyperlipidemia 11/23/2016   GERD (gastroesophageal reflux disease) 11/23/2016   Polyneuropathy 10/30/2016   Bursitis, subacromial 10/30/2016   Numbness 10/30/2016   Arthritis, senescent 01/03/2016   Pain in joint, ankle and foot 02/28/2015   Dry mouth 08/07/2014   Dry eyes 08/07/2014   Other headache syndrome 06/25/2014   Sinusitis, chronic 06/25/2014   Neck pain 06/25/2014   OSA on CPAP 06/25/2014   Essential hypertension, benign 06/25/2014    REFERRING DIAG: Poor balance, Unsteady gait   THERAPY DIAG:  Repeated falls  Muscle weakness (generalized)  Other abnormalities of gait and mobility  Rationale for Evaluation and Treatment  Rehabilitation  PERTINENT HISTORY: See PMH above   PRECAUTIONS: Fall    SUBJECTIVE:                                                                                                                                                                                     SUBJECTIVE STATEMENT:  Patient reports she has done her exercises some but probably not as much as she should. She states her back was feeling horrible earlier today but she took some medication and is feeling better.  PAIN:  Are you having pain? Yes:  NPRS scale: 5/10 Pain location: Low back Pain description: Aching Aggravating factors: Sweeping, standing, walking Relieving factors: Rest   OBJECTIVE: (objective measures completed at initial evaluation unless otherwise dated) PATIENT SURVEYS:  FOTO 36% functional status   POSTURE:             Rounded shoulders, generally kyphotic   LOWER EXTREMITY MMT:   MMT Right eval Left eval Rt / Lt 06/25/2022  Hip flexion 4- 4-   Hip extension 2 2   Hip abduction 3 3   Hip adduction       Hip internal rotation       Hip external rotation       Knee flexion 4- 4 4- / 4  Knee extension 4- 4 4- / 4  Ankle dorsiflexion 4 4   Ankle plantarflexion 2 2   Ankle inversion       Ankle eversion        (Blank rows = not tested)   FUNCTIONAL TESTS:  5 times sit to stand: 22 seconds Timed up and go (TUG): 41 seconds with RW 2 minute walk test: 113 ft with RW Berg Balance Scale: 34/56 (see eval for results)   GAIT: Distance walked: 113 ft Assistive device utilized: Walker - 2 wheeled Level of assistance: Modified independence Comments: Decreased gait speed, bilateral knee flexion throughout gait,      TODAY'S TREATMENT:    [facility] Adult PT  Treatment:                                                DATE: 06/29/22 Therapeutic Exercise: Nustep L5 x 5 minutes  STS x 7 Standing march 10 x 2 Heel raises 10 x 2  Standing hip abduction 10 x 2  Bridge 10 x 2  Therapeutic  Activity: Gait with RW 215 feet total Gait 2 MWT 168 feet     [facility] Adult PT Treatment:                                                DATE: 06/25/2022 Therapeutic Exercise: NuStep L4 x 5 min with LE while taking subjective LAQ with 2# 2 x 20 each Seated alternating march with 2# 2 x 20 Seated clamshell with red 2 x 20 Sit to stand 2 x 5 Seated hamstring curl with yellow 2 x 10 each Standing hip abduction x 10 each Standing march x 20 Heel raises x 10 Romberg on Airex 3 x 60 sec   [facility] Adult PT Treatment:                                                DATE: 06/15/2022 Therapeutic Exercise: Sit to stand 2 x 5 LAQ x 20 each Standing hip abduction x 10 each Standing march x 20 Heel raises x 10   PATIENT EDUCATION:  Education details: HEP Person educated: Patient Education method: Consulting civil engineer, Demonstration, Corporate treasurer cues, Verbal cues Education comprehension: verbalized understanding, returned demonstration, verbal cues required, tactile cues required, and needs further education   HOME EXERCISE PROGRAM: Access Code: [id number]      ASSESSMENT: CLINICAL IMPRESSION: Patient tolerated therapy well with no adverse effects. Therapy limited on time due to patiet arriving late. Therapy focused on progressing LE strength and gait. Gait distance improved today.  No changes made to HEP this visit. Patient would benefit from continued skilled PT to progress her strength and balance in order to improve walking and reduce fall risk.     OBJECTIVE IMPAIRMENTS: Abnormal gait, decreased activity tolerance, decreased balance, decreased endurance, difficulty walking, decreased strength, improper body mechanics, postural dysfunction, and pain.    ACTIVITY LIMITATIONS: carrying, lifting, bending, standing, squatting, stairs, transfers, dressing, hygiene/grooming, and locomotion level   PARTICIPATION LIMITATIONS: meal prep, cleaning, laundry, shopping, and community activity   PERSONAL FACTORS:  Age, Fitness, Past/current experiences, and Time since onset of injury/illness/exacerbation are also affecting patient's functional outcome.      GOALS: Goals reviewed with patient? Yes   SHORT TERM GOALS: Target date: 07/13/2022   Patient will be I with initial HEP in order to progress with therapy. Baseline: HEP provided at eval Goal status: INITIAL   2.  PT will review FOTO with patient by 3rd visit in order to understand expected progress and outcome with therapy. Baseline: FOTO assessed at eval Goal status: INITIAL   LONG TERM GOALS: Target date: 08/10/2022   Patient will be I with final HEP to maintain progress from PT. Baseline: HEP provided at eval Goal status: INITIAL   2.  Patient  will report >/= 46% status on FOTO to indicate improved functional ability. Baseline: 36% functional status Goal status: INITIAL   3.  Patient will demonstrate BERG >/= 42/56 to indicate improved balance and reduced fall risk Baseline: 34/56 Goal status: INITIAL   4.  Patient will demonstrate 5xSTS </= 15 seconds to indicate improved LE strength and reduced fall risk Baseline: 22 seconds Goal status: INITIAL   5.  Patient will perform 2MWT >/= 185 ft using LRAD in order to improve household mobility Baseline: 113 ft using RW Goal status: INITIAL   6.  Patient will perform TUG </= 20 seconds using LRAD to indicate improved mobility and reduced fall risk Baseline: 41 seconds using RW Goal status: INITIAL     PLAN: PT FREQUENCY: 1-2x/week   PT DURATION: 8 weeks   PLANNED INTERVENTIONS: Therapeutic exercises, Therapeutic activity, Neuromuscular re-education, Balance training, Gait training, Patient/Family education, Self Care, Joint mobilization, Aquatic Therapy, Cryotherapy, Moist heat, Manual therapy, and Re-evaluation   PLAN FOR NEXT SESSION: Review HEP and progress PRN, LE strengthening, balance training   Hessie Diener, PTA 06/29/22 1:54 PM Phone: [phone] Fax:  [phone]

## 2022-07-01 ENCOUNTER — Other Ambulatory Visit: Payer: Self-pay

## 2022-07-01 ENCOUNTER — Encounter: Payer: Self-pay | Admitting: Physical Therapy

## 2022-07-01 ENCOUNTER — Ambulatory Visit: Payer: Medicare PPO | Admitting: Physical Therapy

## 2022-07-01 DIAGNOSIS — R2689 Other abnormalities of gait and mobility: Secondary | ICD-10-CM

## 2022-07-01 DIAGNOSIS — M6281 Muscle weakness (generalized): Secondary | ICD-10-CM | POA: Diagnosis not present

## 2022-07-01 DIAGNOSIS — R296 Repeated falls: Secondary | ICD-10-CM

## 2022-07-01 NOTE — Therapy (Signed)
OUTPATIENT PHYSICAL THERAPY TREATMENT NOTE   Patient Name: Cynthia [name] MRN: [id number] DOB:09-21-1940, 82 y.o., female Today's Date: 07/01/2022  PCP: Deland Pretty, MD REFERRING PROVIDER: Holland Commons, FNP   END OF SESSION:   PT End of Session - 07/01/22 1020     Visit Number 4    Number of Visits 17    Date for PT Re-Evaluation 08/10/22    Authorization Type [employer] MCR    Authorization Time Period 06/25/2022 - 08/08/2022    Authorization - Visit Number 3    Authorization - Number of Visits 12    PT Start Time [id number]    PT Stop Time 1100    PT Time Calculation (min) 45 min    Activity Tolerance Patient tolerated treatment well    Behavior During Therapy [facility] for tasks assessed/performed              Past Medical History:  Diagnosis Date   Acute GI bleeding 10/27/2017   Arthritis    "joints" (11/10/2017)   Dizziness    Dysrhythmia    afib   Encounter for loop recorder check 06/09/2020   GERD (gastroesophageal reflux disease)    GI bleed 11/06/2017   Hearing loss    History of blood transfusion 10/2017   Hyperlipidemia    Hypertension    Hyperthyroidism    Loop recorder Biotronik Loop Recorder 05/23/2020 05/23/2020   Melanoma ([place])    "cut off my back"   Memory loss    Pneumonia    Sleep apnea    cpap   Syncope and collapse    Past Surgical History:  Procedure Laterality Date   ABDOMINAL HYSTERECTOMY     "partial"   APPENDECTOMY     BIOPSY  11/17/2017   Procedure: BIOPSY;  Surgeon: Clarene Essex, MD;  Location: [facility];  Service: Endoscopy;;   COLONOSCOPY WITH PROPOFOL N/A 11/17/2017   Procedure: COLONOSCOPY WITH PROPOFOL;  Surgeon: Clarene Essex, MD;  Location: [facility];  Service: Endoscopy;  Laterality: N/A;   CRANIECTOMY FOR EXCISION OF ACOUSTIC NEUROMA     ESOPHAGOGASTRODUODENOSCOPY (EGD) WITH PROPOFOL N/A 10/28/2017   Procedure: ESOPHAGOGASTRODUODENOSCOPY (EGD) WITH PROPOFOL;  Surgeon: Clarene Essex, MD;  [facility]: [facility];  Service:  Endoscopy;  Laterality: N/A;   FOOT SURGERY Bilateral 04/18/2020   KNEE ARTHROPLASTY Right 06/05/2021   Procedure: COMPUTER ASSISTED TOTAL KNEE ARTHROPLASTY;  Surgeon: Rod Can, MD;  Location: [facility];  Service: Orthopedics;  Laterality: Right;   KNEE ARTHROSCOPY Left    LEFT HEART CATH AND CORONARY ANGIOGRAPHY N/A 11/24/2016   Procedure: LEFT HEART CATH AND CORONARY ANGIOGRAPHY;  Surgeon: Jettie Booze, MD;  Location: [facility];  Service: Cardiovascular;  Laterality: N/A;   MELANOMA EXCISION     "off my back"   NASAL SINUS SURGERY     TONSILLECTOMY     Patient Active Problem List   Diagnosis Date Noted   Degenerative arthritis of right knee 06/05/2021   Osteoarthritis of right knee 06/05/2021   NPH (normal pressure hydrocephalus) ([place]) 08/19/2020   Urinary urgency 08/19/2020   Encounter for loop recorder check 06/09/2020   Loop recorder Biotronik Loop Recorder 05/23/2020 05/23/2020   Paroxysmal A-fib ([employer]) 03/24/2020   Gait disturbance 01/29/2020   Periodic limb movement disorder 08/30/2019   Nocturia 07/01/2018   Midline cystocele 06/08/2018   Recurrent UTI 06/08/2018   Vaginal vault prolapse 06/08/2018   Syncope and collapse    Postural dizziness with presyncope 03/17/2018   PAF (paroxysmal atrial  fibrillation) ([place]) 03/17/2018   Memory loss 11/25/2017   Abnormal stress test    Hyperlipidemia 11/23/2016   GERD (gastroesophageal reflux disease) 11/23/2016   Polyneuropathy 10/30/2016   Bursitis, subacromial 10/30/2016   Numbness 10/30/2016   Arthritis, senescent 01/03/2016   Pain in joint, ankle and foot 02/28/2015   Dry mouth 08/07/2014   Dry eyes 08/07/2014   Other headache syndrome 06/25/2014   Sinusitis, chronic 06/25/2014   Neck pain 06/25/2014   OSA on CPAP 06/25/2014   Essential hypertension, benign 06/25/2014    REFERRING DIAG: Poor balance, Unsteady gait   THERAPY DIAG:  Repeated falls  Muscle weakness (generalized)  Other  abnormalities of gait and mobility  Rationale for Evaluation and Treatment Rehabilitation  PERTINENT HISTORY: See PMH above   PRECAUTIONS: Fall    SUBJECTIVE:                                                                                                                                                                                     SUBJECTIVE STATEMENT:  Patient reports she is doing well, she has not done her exercises since her last visit.  PAIN:  Are you having pain? Yes:  NPRS scale: 5/10 Pain location: Low back Pain description: Aching Aggravating factors: Sweeping, standing, walking Relieving factors: Rest   OBJECTIVE: (objective measures completed at initial evaluation unless otherwise dated) PATIENT SURVEYS:  FOTO 36% functional status   POSTURE:             Rounded shoulders, generally kyphotic   LOWER EXTREMITY MMT:   MMT Right eval Left eval Rt / Lt 06/25/2022  Hip flexion 4- 4-   Hip extension 2 2   Hip abduction 3 3   Hip adduction       Hip internal rotation       Hip external rotation       Knee flexion 4- 4 4- / 4  Knee extension 4- 4 4- / 4  Ankle dorsiflexion 4 4   Ankle plantarflexion 2 2   Ankle inversion       Ankle eversion        (Blank rows = not tested)   FUNCTIONAL TESTS:  5 times sit to stand: 22 seconds Timed up and go (TUG): 41 seconds with RW 2 minute walk test: 113 ft with RW  06/29/2022: 168 ft Berg Balance Scale: 34/56 (see eval for results)   GAIT: Distance walked: 113 ft Assistive device utilized: Walker - 2 wheeled Level of assistance: Modified independence Comments: Decreased gait speed, bilateral knee flexion throughout gait,      TODAY'S TREATMENT:    [facility] Adult PT Treatment:  DATE: 07/01/2022 Therapeutic Exercise: NuStep L5 x 6 min with LE while taking subjective Sit to stand 2 x 10 LAQ with 4# 2 x 20 each Standing hip abduction with yellow at knees 2 x 10  each Standing march 2 x 20 Heel toe raises 2 x 10 Romberg with forward reach at counter 2 x 5 each alternating hands Romberg with head turns and nods 2 x 5 each 3/4 tandem 3 x 20 sec each   [facility] Adult PT Treatment:                                                DATE: 06/29/22 Therapeutic Exercise: Nustep L5 x 5 minutes  STS x 7 Standing march 10 x 2 Heel raises 10 x 2  Standing hip abduction 10 x 2  Bridge 10 x 2 Therapeutic Activity: Gait with RW 215 feet total Gait 2 MWT 168 feet   [facility] Adult PT Treatment:                                                DATE: 06/25/2022 Therapeutic Exercise: NuStep L4 x 5 min with LE while taking subjective LAQ with 2# 2 x 20 each Seated alternating march with 2# 2 x 20 Seated clamshell with red 2 x 20 Sit to stand 2 x 5 Seated hamstring curl with yellow 2 x 10 each Standing hip abduction x 10 each Standing march x 20 Heel raises x 10 Romberg on Airex 3 x 60 sec   PATIENT EDUCATION:  Education details: HEP update Person educated: Patient Education method: Explanation, Demonstration, Tactile cues, Verbal cues, Handout Education comprehension: verbalized understanding, returned demonstration, verbal cues required, tactile cues required, and needs further education   HOME EXERCISE PROGRAM: Access Code: [id number]      ASSESSMENT: CLINICAL IMPRESSION: Patient tolerated therapy well with no adverse effects. Therapy continues to focus on progressing LE strength balance training this visit with good tolerance. She is progressing well with her strengthening and tolerated increased resistance with knee and hip strengthening. She does exhibit difficulty with standing stability when reaching outside of BOS or when vision is modified with head movement. Overall she seems to be progressing well and updated her HEP to progress balance training at home. Patient would benefit from continued skilled PT to progress her strength and balance in order to improve  walking and reduce fall risk.     OBJECTIVE IMPAIRMENTS: Abnormal gait, decreased activity tolerance, decreased balance, decreased endurance, difficulty walking, decreased strength, improper body mechanics, postural dysfunction, and pain.    ACTIVITY LIMITATIONS: carrying, lifting, bending, standing, squatting, stairs, transfers, dressing, hygiene/grooming, and locomotion level   PARTICIPATION LIMITATIONS: meal prep, cleaning, laundry, shopping, and community activity   PERSONAL FACTORS: Age, Fitness, Past/current experiences, and Time since onset of injury/illness/exacerbation are also affecting patient's functional outcome.      GOALS: Goals reviewed with patient? Yes   SHORT TERM GOALS: Target date: 07/13/2022   Patient will be I with initial HEP in order to progress with therapy. Baseline: HEP provided at eval Goal status: INITIAL   2.  PT will review FOTO with patient by 3rd visit in order to understand expected progress and outcome with therapy. Baseline: FOTO assessed  at eval 07/01/2022: reviewed Goal status: MET   LONG TERM GOALS: Target date: 08/10/2022   Patient will be I with final HEP to maintain progress from PT. Baseline: HEP provided at eval Goal status: INITIAL   2.  Patient will report >/= 46% status on FOTO to indicate improved functional ability. Baseline: 36% functional status Goal status: INITIAL   3.  Patient will demonstrate BERG >/= 42/56 to indicate improved balance and reduced fall risk Baseline: 34/56 Goal status: INITIAL   4.  Patient will demonstrate 5xSTS </= 15 seconds to indicate improved LE strength and reduced fall risk Baseline: 22 seconds Goal status: INITIAL   5.  Patient will perform 2MWT >/= 185 ft using LRAD in order to improve household mobility Baseline: 113 ft using RW Goal status: INITIAL   6.  Patient will perform TUG </= 20 seconds using LRAD to indicate improved mobility and reduced fall risk Baseline: 41 seconds using  RW Goal status: INITIAL     PLAN: PT FREQUENCY: 1-2x/week   PT DURATION: 8 weeks   PLANNED INTERVENTIONS: Therapeutic exercises, Therapeutic activity, Neuromuscular re-education, Balance training, Gait training, Patient/Family education, Self Care, Joint mobilization, Aquatic Therapy, Cryotherapy, Moist heat, Manual therapy, and Re-evaluation   PLAN FOR NEXT SESSION: Review HEP and progress PRN, LE strengthening, balance training   Hilda Blades, PT, DPT, LAT, ATC 07/01/22  11:03 AM Phone: [phone] Fax: [phone]

## 2022-07-01 NOTE — Patient Instructions (Signed)
Access Code: [email or web address] URL: [email or web address] Date: 07/01/2022 Prepared by: Hilda Blades  Exercises - Sit to Stand  - 3 x daily - 10 reps - Seated Long Arc Quad  - 3 x daily - 20 reps - Standing Hip Abduction with Counter Support  - 3 x daily - 10 reps - Standing March with Counter Support  - 3 x daily - 20 reps - Heel Raises with Counter Support  - 3 x daily - 10 reps - Standing Romberg to 3/4 Tandem Stance  - 2 x daily - 2 reps - 20 seconds hold - Romberg Stance with Head Nods  - 2 x daily - 5 reps - Romberg Stance with Head Rotation  - 2 x daily - 5 reps

## 2022-07-06 ENCOUNTER — Other Ambulatory Visit: Payer: Self-pay

## 2022-07-06 ENCOUNTER — Ambulatory Visit: Payer: Medicare PPO | Admitting: Physical Therapy

## 2022-07-06 ENCOUNTER — Encounter: Payer: Self-pay | Admitting: Physical Therapy

## 2022-07-06 DIAGNOSIS — R2689 Other abnormalities of gait and mobility: Secondary | ICD-10-CM | POA: Diagnosis not present

## 2022-07-06 DIAGNOSIS — R296 Repeated falls: Secondary | ICD-10-CM | POA: Diagnosis not present

## 2022-07-06 DIAGNOSIS — M6281 Muscle weakness (generalized): Secondary | ICD-10-CM | POA: Diagnosis not present

## 2022-07-06 DIAGNOSIS — N390 Urinary tract infection, site not specified: Secondary | ICD-10-CM | POA: Diagnosis not present

## 2022-07-06 NOTE — Therapy (Signed)
OUTPATIENT PHYSICAL THERAPY TREATMENT NOTE   Patient Name: Cynthia [name] MRN: [id number] DOB:[birth year], 82 y.o., female Today's Date: 07/06/2022  PCP: Deland Pretty, MD REFERRING PROVIDER: Holland Commons, FNP   END OF SESSION:   PT End of Session - 07/06/22 1153     Visit Number 5    Number of Visits 17    Date for PT Re-Evaluation 08/10/22    Authorization Type [employer] MCR    Authorization Time Period 06/25/2022 - 08/08/2022    Authorization - Visit Number 4    Authorization - Number of Visits 12    PT Start Time [id number]    PT Stop Time 1225    PT Time Calculation (min) 40 min    Activity Tolerance Patient tolerated treatment well    Behavior During Therapy [facility] for tasks assessed/performed               Past Medical History:  Diagnosis Date   Acute GI bleeding 10/27/2017   Arthritis    "joints" (11/10/2017)   Dizziness    Dysrhythmia    afib   Encounter for loop recorder check 06/09/2020   GERD (gastroesophageal reflux disease)    GI bleed 11/06/2017   Hearing loss    History of blood transfusion 10/2017   Hyperlipidemia    Hypertension    Hyperthyroidism    Loop recorder Biotronik Loop Recorder 05/23/2020 05/23/2020   Melanoma ([place])    "cut off my back"   Memory loss    Pneumonia    Sleep apnea    cpap   Syncope and collapse    Past Surgical History:  Procedure Laterality Date   ABDOMINAL HYSTERECTOMY     "partial"   APPENDECTOMY     BIOPSY  11/17/2017   Procedure: BIOPSY;  Surgeon: Clarene Essex, MD;  [facility];  Service: Endoscopy;;   COLONOSCOPY WITH PROPOFOL N/A 11/17/2017   Procedure: COLONOSCOPY WITH PROPOFOL;  Surgeon: Clarene Essex, MD;  [facility];  Service: Endoscopy;  Laterality: N/A;   CRANIECTOMY FOR EXCISION OF ACOUSTIC NEUROMA     ESOPHAGOGASTRODUODENOSCOPY (EGD) WITH PROPOFOL N/A 10/28/2017   Procedure: ESOPHAGOGASTRODUODENOSCOPY (EGD) WITH PROPOFOL;  Surgeon: Clarene Essex, MD;  [facility];  Service:  Endoscopy;  Laterality: N/A;   FOOT SURGERY Bilateral 04/18/2020   KNEE ARTHROPLASTY Right 06/05/2021   Procedure: COMPUTER ASSISTED TOTAL KNEE ARTHROPLASTY;  Surgeon: Rod Can, MD;  Location[facility];  Service: Orthopedics;  Laterality: Right;   KNEE ARTHROSCOPY Left    LEFT HEART CATH AND CORONARY ANGIOGRAPHY N/A 11/24/2016   Procedure: LEFT HEART CATH AND CORONARY ANGIOGRAPHY;  Surgeon: Jettie Booze, MD;  Location: [facility];  Service: Cardiovascular;  Laterality: N/A;   MELANOMA EXCISION     "off my back"   NASAL SINUS SURGERY     TONSILLECTOMY     Patient Active Problem List   Diagnosis Date Noted   Degenerative arthritis of right knee 06/05/2021   Osteoarthritis of right knee 06/05/2021   NPH (normal pressure hydrocephalus) ([place]) 08/19/2020   Urinary urgency 08/19/2020   Encounter for loop recorder check 06/09/2020   Loop recorder Biotronik Loop Recorder 05/23/2020 05/23/2020   Paroxysmal A-fib ([place]) 03/24/2020   Gait disturbance 01/29/2020   Periodic limb movement disorder 08/30/2019   Nocturia 07/01/2018   Midline cystocele 06/08/2018   Recurrent UTI 06/08/2018   Vaginal vault prolapse 06/08/2018   Syncope and collapse    Postural dizziness with presyncope 03/17/2018   PAF (paroxysmal  atrial fibrillation) ([place]) 03/17/2018   Memory loss 11/25/2017   Abnormal stress test    Hyperlipidemia 11/23/2016   GERD (gastroesophageal reflux disease) 11/23/2016   Polyneuropathy 10/30/2016   Bursitis, subacromial 10/30/2016   Numbness 10/30/2016   Arthritis, senescent 01/03/2016   Pain in joint, ankle and foot 02/28/2015   Dry mouth 08/07/2014   Dry eyes 08/07/2014   Other headache syndrome 06/25/2014   Sinusitis, chronic 06/25/2014   Neck pain 06/25/2014   OSA on CPAP 06/25/2014   Essential hypertension, benign 06/25/2014    REFERRING DIAG: Poor balance, Unsteady gait   THERAPY DIAG:  Repeated falls  Muscle weakness (generalized)  Other  abnormalities of gait and mobility  Rationale for Evaluation and Treatment Rehabilitation  PERTINENT HISTORY: See PMH above   PRECAUTIONS: Fall    SUBJECTIVE:                                                                                                                                                                                     SUBJECTIVE STATEMENT:  Patient reports she is doing well, she is using her cane today because she lost a tennis ball on her walker. She does her exercises when she can.  PAIN:  Are you having pain? Yes:  NPRS scale: 5/10 Pain location: Low back Pain description: Aching Aggravating factors: Sweeping, standing, walking Relieving factors: Rest   OBJECTIVE: (objective measures completed at initial evaluation unless otherwise dated) PATIENT SURVEYS:  FOTO 36% functional status   POSTURE:             Rounded shoulders, generally kyphotic   LOWER EXTREMITY MMT:   MMT Right eval Left eval Rt / Lt 06/25/2022  Hip flexion 4- 4-   Hip extension 2 2   Hip abduction 3 3   Hip adduction       Hip internal rotation       Hip external rotation       Knee flexion 4- 4 4- / 4  Knee extension 4- 4 4- / 4  Ankle dorsiflexion 4 4   Ankle plantarflexion 2 2   Ankle inversion       Ankle eversion        (Blank rows = not tested)   FUNCTIONAL TESTS:  5 times sit to stand: 22 seconds  07/06/2022: 19 seconds Timed up and go (TUG): 41 seconds with RW 2 minute walk test: 113 ft with RW  06/29/2022: 168 ft Berg Balance Scale: 34/56 (see eval for results)   GAIT: Distance walked: 113 ft Assistive device utilized: Walker - 2 wheeled Level of assistance: Modified independence Comments: Decreased gait speed, bilateral knee flexion throughout gait,  TODAY'S TREATMENT:    [facility] Adult PT Treatment:                                                DATE: 07/06/2022 Therapeutic Exercise: NuStep L6 x 6 min with UE/LE while taking subjective Sit to stand 2 x  10 Bridge 2 x 10 SLR 2 x 10 each Sidelying hip abduction 2 x 10 each LAQ with 5# 2 x 20 each Romberg with head turns and nods 2 x 10 each Standing march 2 x 20 Lateral stepping with occasional use of UE for support x 5 lengths   [facility] Adult PT Treatment:                                                DATE: 07/01/2022 Therapeutic Exercise: NuStep L5 x 6 min with LE while taking subjective Sit to stand 2 x 10 LAQ with 4# 2 x 20 each Standing hip abduction with yellow at knees 2 x 10 each Standing march 2 x 20 Heel toe raises 2 x 10 Romberg with forward reach at counter 2 x 5 each alternating hands Romberg with head turns and nods 2 x 5 each 3/4 tandem 3 x 20 sec each  [facility] Adult PT Treatment:                                                DATE: 06/29/22 Therapeutic Exercise: Nustep L5 x 5 minutes  STS x 7 Standing march 10 x 2 Heel raises 10 x 2  Standing hip abduction 10 x 2  Bridge 10 x 2 Therapeutic Activity: Gait with RW 215 feet total Gait 2 MWT 168 feet    PATIENT EDUCATION:  Education details: HEP Person educated: Patient Education method: Consulting civil engineer, Demonstration, Corporate treasurer cues, Verbal cues Education comprehension: verbalized understanding, returned demonstration, verbal cues required, tactile cues required, and needs further education   HOME EXERCISE PROGRAM: Access Code: [id number]      ASSESSMENT: CLINICAL IMPRESSION: Patient tolerated therapy well with no adverse effects. Therapy continues to focus on progressing her LE strength and stability with standing and walking. She does continue to exhibit increased sway with balance tasks where vision is challenged. She did well with sidestepping with only occasional use of UE for support. She used her cane this visit and does exhibit a decreased gait speed with the cane. Her 5xSTS time improved this visit indicating improved LE strength. No changes made to HEP this visit. Patient would benefit from continued skilled PT to  progress her strength and balance in order to improve walking and reduce fall risk.     OBJECTIVE IMPAIRMENTS: Abnormal gait, decreased activity tolerance, decreased balance, decreased endurance, difficulty walking, decreased strength, improper body mechanics, postural dysfunction, and pain.    ACTIVITY LIMITATIONS: carrying, lifting, bending, standing, squatting, stairs, transfers, dressing, hygiene/grooming, and locomotion level   PARTICIPATION LIMITATIONS: meal prep, cleaning, laundry, shopping, and community activity   PERSONAL FACTORS: Age, Fitness, Past/current experiences, and Time since onset of injury/illness/exacerbation are also affecting patient's functional outcome.      GOALS: Goals reviewed with  patient? Yes   SHORT TERM GOALS: Target date: 07/13/2022   Patient will be I with initial HEP in order to progress with therapy. Baseline: HEP provided at eval Goal status: INITIAL   2.  PT will review FOTO with patient by 3rd visit in order to understand expected progress and outcome with therapy. Baseline: FOTO assessed at eval 07/01/2022: reviewed Goal status: MET   LONG TERM GOALS: Target date: 08/10/2022   Patient will be I with final HEP to maintain progress from PT. Baseline: HEP provided at eval Goal status: INITIAL   2.  Patient will report >/= 46% status on FOTO to indicate improved functional ability. Baseline: 36% functional status Goal status: INITIAL   3.  Patient will demonstrate BERG >/= 42/56 to indicate improved balance and reduced fall risk Baseline: 34/56 Goal status: INITIAL   4.  Patient will demonstrate 5xSTS </= 15 seconds to indicate improved LE strength and reduced fall risk Baseline: 22 seconds 07/06/2022: 19 seconds Goal status: PARTIALLY MET   5.  Patient will perform 2MWT >/= 185 ft using LRAD in order to improve household mobility Baseline: 113 ft using RW Goal status: INITIAL   6.  Patient will perform TUG </= 20 seconds using LRAD to  indicate improved mobility and reduced fall risk Baseline: 41 seconds using RW Goal status: INITIAL     PLAN: PT FREQUENCY: 1-2x/week   PT DURATION: 8 weeks   PLANNED INTERVENTIONS: Therapeutic exercises, Therapeutic activity, Neuromuscular re-education, Balance training, Gait training, Patient/Family education, Self Care, Joint mobilization, Aquatic Therapy, Cryotherapy, Moist heat, Manual therapy, and Re-evaluation   PLAN FOR NEXT SESSION: Review HEP and progress PRN, LE strengthening, balance training   Hilda Blades, PT, DPT, LAT, ATC 07/06/22  12:30 PM Phone: [phone] Fax: [phone]

## 2022-07-08 ENCOUNTER — Encounter: Payer: Self-pay | Admitting: Physical Therapy

## 2022-07-08 ENCOUNTER — Ambulatory Visit: Payer: Medicare PPO | Admitting: Physical Therapy

## 2022-07-08 DIAGNOSIS — R296 Repeated falls: Secondary | ICD-10-CM | POA: Diagnosis not present

## 2022-07-08 DIAGNOSIS — M6281 Muscle weakness (generalized): Secondary | ICD-10-CM

## 2022-07-08 DIAGNOSIS — R2689 Other abnormalities of gait and mobility: Secondary | ICD-10-CM | POA: Diagnosis not present

## 2022-07-08 NOTE — Therapy (Signed)
OUTPATIENT PHYSICAL THERAPY TREATMENT NOTE   Patient Name: Cynthia [name] MRN: [id number] DOB:1940/12/31, 82 y.o., female Today's Date: 07/08/2022  PCP: Deland Pretty, MD REFERRING PROVIDER: Holland Commons, FNP   END OF SESSION:   PT End of Session - 07/08/22 1201     Visit Number 6    Number of Visits 17    Date for PT Re-Evaluation 08/10/22    Authorization Type [employer] MCR    Authorization Time Period 06/25/2022 - 08/08/2022    Authorization - Visit Number 5    Authorization - Number of Visits 12    PT Start Time [id number]    PT Stop Time 1230    PT Time Calculation (min) 45 min               Past Medical History:  Diagnosis Date   Acute GI bleeding 10/27/2017   Arthritis    "joints" (11/10/2017)   Dizziness    Dysrhythmia    afib   Encounter for loop recorder check 06/09/2020   GERD (gastroesophageal reflux disease)    GI bleed 11/06/2017   Hearing loss    History of blood transfusion 10/2017   Hyperlipidemia    Hypertension    Hyperthyroidism    Loop recorder Biotronik Loop Recorder 05/23/2020 05/23/2020   Melanoma ([place])    "cut off my back"   Memory loss    Pneumonia    Sleep apnea    cpap   Syncope and collapse    Past Surgical History:  Procedure Laterality Date   ABDOMINAL HYSTERECTOMY     "partial"   APPENDECTOMY     BIOPSY  11/17/2017   Procedure: BIOPSY;  Surgeon: Clarene Essex, MD;  Location: [facility];  Service: Endoscopy;;   COLONOSCOPY WITH PROPOFOL N/A 11/17/2017   Procedure: COLONOSCOPY WITH PROPOFOL;  Surgeon: Clarene Essex, MD;  Location: [facility];  Service: Endoscopy;  Laterality: N/A;   CRANIECTOMY FOR EXCISION OF ACOUSTIC NEUROMA     ESOPHAGOGASTRODUODENOSCOPY (EGD) WITH PROPOFOL N/A 10/28/2017   Procedure: ESOPHAGOGASTRODUODENOSCOPY (EGD) WITH PROPOFOL;  Surgeon: Clarene Essex, MD;  Location: [facility];  Service: Endoscopy;  Laterality: N/A;   FOOT SURGERY Bilateral 04/18/2020   KNEE ARTHROPLASTY Right 06/05/2021   Procedure:  COMPUTER ASSISTED TOTAL KNEE ARTHROPLASTY;  Surgeon: Rod Can, MD;  Location: [facility];  Service: Orthopedics;  Laterality: Right;   KNEE ARTHROSCOPY Left    LEFT HEART CATH AND CORONARY ANGIOGRAPHY N/A 11/24/2016   Procedure: LEFT HEART CATH AND CORONARY ANGIOGRAPHY;  Surgeon: Jettie Booze, MD;  Location: [facility];  Service: Cardiovascular;  Laterality: N/A;   MELANOMA EXCISION     "off my back"   NASAL SINUS SURGERY     TONSILLECTOMY     Patient Active Problem List   Diagnosis Date Noted   Degenerative arthritis of right knee 06/05/2021   Osteoarthritis of right knee 06/05/2021   NPH (normal pressure hydrocephalus) ([place]) 08/19/2020   Urinary urgency 08/19/2020   Encounter for loop recorder check 06/09/2020   Loop recorder Biotronik Loop Recorder 05/23/2020 05/23/2020   Paroxysmal A-fib ([place]) 03/24/2020   Gait disturbance 01/29/2020   Periodic limb movement disorder 08/30/2019   Nocturia 07/01/2018   Midline cystocele 06/08/2018   Recurrent UTI 06/08/2018   Vaginal vault prolapse 06/08/2018   Syncope and collapse    Postural dizziness with presyncope 03/17/2018   PAF (paroxysmal atrial fibrillation) ([place]) 03/17/2018   Memory loss 11/25/2017   Abnormal stress test    Hyperlipidemia 11/23/2016  GERD (gastroesophageal reflux disease) 11/23/2016   Polyneuropathy 10/30/2016   Bursitis, subacromial 10/30/2016   Numbness 10/30/2016   Arthritis, senescent 01/03/2016   Pain in joint, ankle and foot 02/28/2015   Dry mouth 08/07/2014   Dry eyes 08/07/2014   Other headache syndrome 06/25/2014   Sinusitis, chronic 06/25/2014   Neck pain 06/25/2014   OSA on CPAP 06/25/2014   Essential hypertension, benign 06/25/2014    REFERRING DIAG: Poor balance, Unsteady gait   THERAPY DIAG:  Repeated falls  Muscle weakness (generalized)  Other abnormalities of gait and mobility  Rationale for Evaluation and Treatment Rehabilitation  PERTINENT HISTORY: See PMH  above   PRECAUTIONS: Fall    SUBJECTIVE:                                                                                                                                                                                     SUBJECTIVE STATEMENT:  Patient arrives with RW and reports she leaving an exercise class prior to coming here today.   PAIN:  Are you having pain? Yes:  NPRS scale: 4.8/10 Pain location: Low back Pain description: Aching Aggravating factors: Sweeping, standing, walking Relieving factors: Rest   OBJECTIVE: (objective measures completed at initial evaluation unless otherwise dated) PATIENT SURVEYS:  FOTO 36% functional status FOTO 35% 07/08/22   POSTURE:             Rounded shoulders, generally kyphotic   LOWER EXTREMITY MMT:   MMT Right eval Left eval Rt / Lt 06/25/2022  Hip flexion 4- 4-   Hip extension 2 2   Hip abduction 3 3   Hip adduction       Hip internal rotation       Hip external rotation       Knee flexion 4- 4 4- / 4  Knee extension 4- 4 4- / 4  Ankle dorsiflexion 4 4   Ankle plantarflexion 2 2   Ankle inversion       Ankle eversion        (Blank rows = not tested)   FUNCTIONAL TESTS:  5 times sit to stand: 22 seconds  07/06/2022: 19 seconds Timed up and go (TUG): 41 seconds with RW 2 minute walk test: 113 ft with RW  06/29/2022: 168 ft Berg Balance Scale: 34/56 (see eval for results)   GAIT: Distance walked: 113 ft Assistive device utilized: Walker - 2 wheeled Level of assistance: Modified independence Comments: Decreased gait speed, bilateral knee flexion throughout gait,      TODAY'S TREATMENT:    [facility] PT Treatment:  DATE: 07/08/22 Therapeutic Exercise: STS x 10 Heel raises Heel raises seated  Seated toe raises  Nustep L4 UE/LE x 6 minutes FOTO  35%  Neuromuscular re-ed: Staggered stance trials  Rhomberg with head turns    [facility] Adult PT Treatment:                                                 DATE: 07/06/2022 Therapeutic Exercise: NuStep L6 x 6 min with UE/LE while taking subjective Sit to stand 2 x 10 Bridge 2 x 10 SLR 2 x 10 each Sidelying hip abduction 2 x 10 each LAQ with 5# 2 x 20 each Romberg with head turns and nods 2 x 10 each Standing march 2 x 20 Lateral stepping with occasional use of UE for support x 5 lengths   [facility] Adult PT Treatment:                                                DATE: 07/01/2022 Therapeutic Exercise: NuStep L5 x 6 min with LE while taking subjective Sit to stand 2 x 10 LAQ with 4# 2 x 20 each Standing hip abduction with yellow at knees 2 x 10 each Standing march 2 x 20 Heel toe raises 2 x 10 Romberg with forward reach at counter 2 x 5 each alternating hands Romberg with head turns and nods 2 x 5 each 3/4 tandem 3 x 20 sec each  [facility] Adult PT Treatment:                                                DATE: 06/29/22 Therapeutic Exercise: Nustep L5 x 5 minutes  STS x 7 Standing march 10 x 2 Heel raises 10 x 2  Standing hip abduction 10 x 2  Bridge 10 x 2 Therapeutic Activity: Gait with RW 215 feet total Gait 2 MWT 168 feet    PATIENT EDUCATION:  Education details: HEP Person educated: Patient Education method: Consulting civil engineer, Demonstration, Corporate treasurer cues, Verbal cues Education comprehension: verbalized understanding, returned demonstration, verbal cues required, tactile cues required, and needs further education   HOME EXERCISE PROGRAM: Access Code: [id number]      ASSESSMENT: CLINICAL IMPRESSION: Patient tolerated therapy well with no adverse effects. Therapy continues to focus on progressing her LE strength and stability with standing and walking. She reports slight improvement in overall confidence in her balance. She reports being more cautious to prevent falls. Her FOTO score slightly decreased. No changes made to HEP this visit. Patient would benefit from continued skilled PT to progress her strength  and balance in order to improve walking and reduce fall risk.     OBJECTIVE IMPAIRMENTS: Abnormal gait, decreased activity tolerance, decreased balance, decreased endurance, difficulty walking, decreased strength, improper body mechanics, postural dysfunction, and pain.    ACTIVITY LIMITATIONS: carrying, lifting, bending, standing, squatting, stairs, transfers, dressing, hygiene/grooming, and locomotion level   PARTICIPATION LIMITATIONS: meal prep, cleaning, laundry, shopping, and community activity   PERSONAL FACTORS: Age, Fitness, Past/current experiences, and Time since onset of injury/illness/exacerbation are also affecting patient's functional outcome.  GOALS: Goals reviewed with patient? Yes   SHORT TERM GOALS: Target date: 07/13/2022   Patient will be I with initial HEP in order to progress with therapy. Baseline: HEP provided at eval Goal status: INITIAL   2.  PT will review FOTO with patient by 3rd visit in order to understand expected progress and outcome with therapy. Baseline: FOTO assessed at eval 07/01/2022: reviewed Goal status: MET   LONG TERM GOALS: Target date: 08/10/2022   Patient will be I with final HEP to maintain progress from PT. Baseline: HEP provided at eval Goal status: INITIAL   2.  Patient will report >/= 46% status on FOTO to indicate improved functional ability. Baseline: 36% functional status Goal status: INITIAL   3.  Patient will demonstrate BERG >/= 42/56 to indicate improved balance and reduced fall risk Baseline: 34/56 Goal status: INITIAL   4.  Patient will demonstrate 5xSTS </= 15 seconds to indicate improved LE strength and reduced fall risk Baseline: 22 seconds 07/06/2022: 19 seconds Goal status: PARTIALLY MET   5.  Patient will perform 2MWT >/= 185 ft using LRAD in order to improve household mobility Baseline: 113 ft using RW Goal status: INITIAL   6.  Patient will perform TUG </= 20 seconds using LRAD to indicate improved  mobility and reduced fall risk Baseline: 41 seconds using RW Goal status: INITIAL     PLAN: PT FREQUENCY: 1-2x/week   PT DURATION: 8 weeks   PLANNED INTERVENTIONS: Therapeutic exercises, Therapeutic activity, Neuromuscular re-education, Balance training, Gait training, Patient/Family education, Self Care, Joint mobilization, Aquatic Therapy, Cryotherapy, Moist heat, Manual therapy, and Re-evaluation   PLAN FOR NEXT SESSION: Review HEP and progress PRN, LE strengthening, balance training    Hessie Diener, PTA 07/08/22 1:13 PM Phone: [phone] Fax: [phone]

## 2022-07-13 ENCOUNTER — Other Ambulatory Visit: Payer: Self-pay

## 2022-07-13 ENCOUNTER — Encounter: Payer: Self-pay | Admitting: Physical Therapy

## 2022-07-13 ENCOUNTER — Ambulatory Visit: Payer: Medicare PPO | Attending: Registered Nurse | Admitting: Physical Therapy

## 2022-07-13 DIAGNOSIS — M6281 Muscle weakness (generalized): Secondary | ICD-10-CM | POA: Insufficient documentation

## 2022-07-13 DIAGNOSIS — E7800 Pure hypercholesterolemia, unspecified: Secondary | ICD-10-CM | POA: Diagnosis not present

## 2022-07-13 DIAGNOSIS — R2689 Other abnormalities of gait and mobility: Secondary | ICD-10-CM | POA: Insufficient documentation

## 2022-07-13 DIAGNOSIS — N390 Urinary tract infection, site not specified: Secondary | ICD-10-CM | POA: Diagnosis not present

## 2022-07-13 DIAGNOSIS — E039 Hypothyroidism, unspecified: Secondary | ICD-10-CM | POA: Diagnosis not present

## 2022-07-13 DIAGNOSIS — R296 Repeated falls: Secondary | ICD-10-CM | POA: Insufficient documentation

## 2022-07-13 DIAGNOSIS — I480 Paroxysmal atrial fibrillation: Secondary | ICD-10-CM | POA: Diagnosis not present

## 2022-07-13 DIAGNOSIS — E559 Vitamin D deficiency, unspecified: Secondary | ICD-10-CM | POA: Diagnosis not present

## 2022-07-13 NOTE — Therapy (Signed)
OUTPATIENT PHYSICAL THERAPY TREATMENT NOTE   Patient Name: Cynthia [name] MRN: [id number] DOB:[birth year], 82 y.o., female Today's Date: 07/13/2022  PCP: Deland Pretty, MD REFERRING PROVIDER: Holland Commons, FNP   END OF SESSION:   PT End of Session - 07/13/22 1159     Visit Number 7    Number of Visits 17    Date for PT Re-Evaluation 08/10/22    Authorization Type [employer] MCR    Authorization Time Period 06/25/2022 - 08/08/2022    Authorization - Visit Number 6    Authorization - Number of Visits 12    PT Start Time [id number]    PT Stop Time 1225    PT Time Calculation (min) 39 min    Activity Tolerance Patient tolerated treatment well    Behavior During Therapy [facility] for tasks assessed/performed                Past Medical History:  Diagnosis Date   Acute GI bleeding 10/27/2017   Arthritis    "joints" (11/10/2017)   Dizziness    Dysrhythmia    afib   Encounter for loop recorder check 06/09/2020   GERD (gastroesophageal reflux disease)    GI bleed 11/06/2017   Hearing loss    History of blood transfusion 10/2017   Hyperlipidemia    Hypertension    Hyperthyroidism    Loop recorder Biotronik Loop Recorder 05/23/2020 05/23/2020   Melanoma    "cut off my back"   Memory loss    Pneumonia    Sleep apnea    cpap   Syncope and collapse    Past Surgical History:  Procedure Laterality Date   ABDOMINAL HYSTERECTOMY     "partial"   APPENDECTOMY     BIOPSY  11/17/2017   Procedure: BIOPSY;  Surgeon: Clarene Essex, MD;  [facility];  Service: Endoscopy;;   COLONOSCOPY WITH PROPOFOL N/A 11/17/2017   Procedure: COLONOSCOPY WITH PROPOFOL;  Surgeon: Clarene Essex, MD;  [facility];  Service: Endoscopy;  Laterality: N/A;   CRANIECTOMY FOR EXCISION OF ACOUSTIC NEUROMA     ESOPHAGOGASTRODUODENOSCOPY (EGD) WITH PROPOFOL N/A 10/28/2017   Procedure: ESOPHAGOGASTRODUODENOSCOPY (EGD) WITH PROPOFOL;  Surgeon: Clarene Essex, MD;  [facility];  Service:  Endoscopy;  Laterality: N/A;   FOOT SURGERY Bilateral 04/18/2020   KNEE ARTHROPLASTY Right 06/05/2021   Procedure: COMPUTER ASSISTED TOTAL KNEE ARTHROPLASTY;  Surgeon: Rod Can, MD;  [facility];  Service: Orthopedics;  Laterality: Right;   KNEE ARTHROSCOPY Left    LEFT HEART CATH AND CORONARY ANGIOGRAPHY N/A 11/24/2016   Procedure: LEFT HEART CATH AND CORONARY ANGIOGRAPHY;  Surgeon: Jettie Booze, MD;  Location: [place] [facility];  Service: Cardiovascular;  Laterality: N/A;   MELANOMA EXCISION     "off my back"   NASAL SINUS SURGERY     TONSILLECTOMY     Patient Active Problem List   Diagnosis Date Noted   Degenerative arthritis of right knee 06/05/2021   Osteoarthritis of right knee 06/05/2021   NPH (normal pressure hydrocephalus) 08/19/2020   Urinary urgency 08/19/2020   Encounter for loop recorder check 06/09/2020   Loop recorder Biotronik Loop Recorder 05/23/2020 05/23/2020   Paroxysmal A-fib 03/24/2020   Gait disturbance 01/29/2020   Periodic limb movement disorder 08/30/2019   Nocturia 07/01/2018   Midline cystocele 06/08/2018   Recurrent UTI 06/08/2018   Vaginal vault prolapse 06/08/2018   Syncope and collapse    Postural dizziness with presyncope 03/17/2018   PAF (paroxysmal atrial fibrillation)  03/17/2018   Memory loss 11/25/2017   Abnormal stress test    Hyperlipidemia 11/23/2016   GERD (gastroesophageal reflux disease) 11/23/2016   Polyneuropathy 10/30/2016   Bursitis, subacromial 10/30/2016   Numbness 10/30/2016   Arthritis, senescent 01/03/2016   Pain in joint, ankle and foot 02/28/2015   Dry mouth 08/07/2014   Dry eyes 08/07/2014   Other headache syndrome 06/25/2014   Sinusitis, chronic 06/25/2014   Neck pain 06/25/2014   OSA on CPAP 06/25/2014   Essential hypertension, benign 06/25/2014    REFERRING DIAG: Poor balance, Unsteady gait   THERAPY DIAG:  Repeated falls  Muscle weakness (generalized)  Other abnormalities of gait and  mobility  Rationale for Evaluation and Treatment Rehabilitation  PERTINENT HISTORY: See PMH above   PRECAUTIONS: Fall    SUBJECTIVE:                                                                                                                                                                                     SUBJECTIVE STATEMENT:  Patient arrives in a transport wheelchair and states that her right toes are hurting where one has grown under the other.   PAIN:  Are you having pain? Yes:  NPRS scale: 8 /10 Pain location: Right toe Pain description: Burning Aggravating factors: Sweeping, standing, walking Relieving factors: Rest   OBJECTIVE: (objective measures completed at initial evaluation unless otherwise dated) PATIENT SURVEYS:  FOTO 36% functional status 07/08/22: 35%    POSTURE:             Rounded shoulders, generally kyphotic   LOWER EXTREMITY MMT:   MMT Right eval Left eval Rt / Lt 06/25/2022 Rt / Lt 07/13/2022  Hip flexion 4- 4-    Hip extension 2 2  3  / 3  Hip abduction 3 3  3  / 3  Hip adduction        Hip internal rotation        Hip external rotation        Knee flexion 4- 4 4- / 4   Knee extension 4- 4 4- / 4   Ankle dorsiflexion 4 4    Ankle plantarflexion 2 2    Ankle inversion        Ankle eversion         (Blank rows = not tested)   FUNCTIONAL TESTS:  5 times sit to stand: 22 seconds  07/06/2022: 19 seconds Timed up and go (TUG): 41 seconds with RW 2 minute walk test: 113 ft with RW  06/29/2022: 168 ft Berg Balance Scale: 34/56 (see eval for results)   GAIT: Distance walked: 113 ft Assistive device utilized: Environmental consultant - 2  wheeled Level of assistance: Modified independence Comments: Decreased gait speed, bilateral knee flexion throughout gait,      TODAY'S TREATMENT:    [facility] Adult PT Treatment:                                                DATE: 07/13/2022 Therapeutic Exercise: NuStep L6 x 6 min with UE/LE while taking subjective Bridge 2 x  10 SLR 2 x 10 each Hooklying clamshell with green 2 x 15 Sidelying hip abduction 2 x 15 each LAQ with 5# 2 x 20 each Seated march with 5# 2 x 20 Sit to stand 2 x 10   [facility] Adult PT Treatment:                                                DATE: 07/08/22 Therapeutic Exercise: STS x 10 Heel raises Heel raises seated  Seated toe raises  Nustep L4 UE/LE x 6 minutes FOTO  35% Neuromuscular re-ed: Staggered stance trials  Rhomberg with head turns   [facility] Adult PT Treatment:                                                DATE: 07/06/2022 Therapeutic Exercise: NuStep L6 x 6 min with UE/LE while taking subjective Sit to stand 2 x 10 Bridge 2 x 10 SLR 2 x 10 each Sidelying hip abduction 2 x 10 each LAQ with 5# 2 x 20 each Romberg with head turns and nods 2 x 10 each Standing march 2 x 20 Lateral stepping with occasional use of UE for support x 5 lengths  [facility] Adult PT Treatment:                                                DATE: 07/01/2022 Therapeutic Exercise: NuStep L5 x 6 min with LE while taking subjective Sit to stand 2 x 10 LAQ with 4# 2 x 20 each Standing hip abduction with yellow at knees 2 x 10 each Standing march 2 x 20 Heel toe raises 2 x 10 Romberg with forward reach at counter 2 x 5 each alternating hands Romberg with head turns and nods 2 x 5 each 3/4 tandem 3 x 20 sec each   PATIENT EDUCATION:  Education details: HEP Person educated: Patient Education method: Consulting civil engineer, Demonstration, Tactile cues, Verbal cues Education comprehension: verbalized understanding, returned demonstration, verbal cues required, tactile cues required, and needs further education   HOME EXERCISE PROGRAM: Access Code: [id number]      ASSESSMENT: CLINICAL IMPRESSION: Patient tolerated therapy well with no adverse effects. Therapy focused primarily on mat based strengthening exercises due to patient reporting pain of right toes that impacted her standing tolerance and balance this  visit. She did well with exercises focused on strengthening hips and LE. She does exhibit gross hip strength deficits but has improved with hip extension strength this visit. Patient does require cueing throughout session for proper exercise technique. No changes  made to HEP. Patient would benefit from continued skilled PT to progress her strength and balance in order to improve walking and reduce fall risk.     OBJECTIVE IMPAIRMENTS: Abnormal gait, decreased activity tolerance, decreased balance, decreased endurance, difficulty walking, decreased strength, improper body mechanics, postural dysfunction, and pain.    ACTIVITY LIMITATIONS: carrying, lifting, bending, standing, squatting, stairs, transfers, dressing, hygiene/grooming, and locomotion level   PARTICIPATION LIMITATIONS: meal prep, cleaning, laundry, shopping, and community activity   PERSONAL FACTORS: Age, Fitness, Past/current experiences, and Time since onset of injury/illness/exacerbation are also affecting patient's functional outcome.      GOALS: Goals reviewed with patient? Yes   SHORT TERM GOALS: Target date: 07/13/2022   Patient will be I with initial HEP in order to progress with therapy. Baseline: HEP provided at eval 07/13/2022: progressing Goal status: ONGOING   2.  PT will review FOTO with patient by 3rd visit in order to understand expected progress and outcome with therapy. Baseline: FOTO assessed at eval 07/01/2022: reviewed Goal status: MET   LONG TERM GOALS: Target date: 08/10/2022   Patient will be I with final HEP to maintain progress from PT. Baseline: HEP provided at eval Goal status: INITIAL   2.  Patient will report >/= 46% status on FOTO to indicate improved functional ability. Baseline: 36% functional status Goal status: INITIAL   3.  Patient will demonstrate BERG >/= 42/56 to indicate improved balance and reduced fall risk Baseline: 34/56 Goal status: INITIAL   4.  Patient will demonstrate  5xSTS </= 15 seconds to indicate improved LE strength and reduced fall risk Baseline: 22 seconds 07/06/2022: 19 seconds Goal status: PARTIALLY MET   5.  Patient will perform 2MWT >/= 185 ft using LRAD in order to improve household mobility Baseline: 113 ft using RW Goal status: INITIAL   6.  Patient will perform TUG </= 20 seconds using LRAD to indicate improved mobility and reduced fall risk Baseline: 41 seconds using RW Goal status: INITIAL     PLAN: PT FREQUENCY: 1-2x/week   PT DURATION: 8 weeks   PLANNED INTERVENTIONS: Therapeutic exercises, Therapeutic activity, Neuromuscular re-education, Balance training, Gait training, Patient/Family education, Self Care, Joint mobilization, Aquatic Therapy, Cryotherapy, Moist heat, Manual therapy, and Re-evaluation   PLAN FOR NEXT SESSION: Review HEP and progress PRN, LE strengthening, balance training    Hilda Blades, PT, DPT, LAT, ATC 07/13/22  12:37 PM Phone: [phone] Fax: [phone]

## 2022-07-14 ENCOUNTER — Other Ambulatory Visit: Payer: Self-pay

## 2022-07-14 MED ORDER — DILTIAZEM HCL ER COATED BEADS 120 MG PO CP24: 120.0000 mg | ORAL_CAPSULE | Freq: Every day | ORAL | 1 refills | Status: DC

## 2022-07-14 NOTE — Therapy (Signed)
OUTPATIENT PHYSICAL THERAPY TREATMENT NOTE   Patient Name: Cynthia [name] MRN: [id number] DOB:08-19-1940, 82 y.o., female Today's Date: 07/15/2022  PCP: Deland Pretty, MD REFERRING PROVIDER: Holland Commons, FNP   END OF SESSION:   PT End of Session - 07/15/22 1113     Visit Number 8    Number of Visits 17    Date for PT Re-Evaluation 08/10/22    Authorization Type [employer] MCR    Authorization Time Period 06/25/2022 - 08/08/2022    Authorization - Visit Number 7    Authorization - Number of Visits 12    PT Start Time 1110    PT Stop Time 1145    PT Time Calculation (min) 35 min    Activity Tolerance Patient tolerated treatment well    Behavior During Therapy [facility] for tasks assessed/performed                 Past Medical History:  Diagnosis Date   Acute GI bleeding 10/27/2017   Arthritis    "joints" (11/10/2017)   Dizziness    Dysrhythmia    afib   Encounter for loop recorder check 06/09/2020   GERD (gastroesophageal reflux disease)    GI bleed 11/06/2017   Hearing loss    History of blood transfusion 10/2017   Hyperlipidemia    Hypertension    Hyperthyroidism    Loop recorder Biotronik Loop Recorder 05/23/2020 05/23/2020   Melanoma    "cut off my back"   Memory loss    Pneumonia    Sleep apnea    cpap   Syncope and collapse    Past Surgical History:  Procedure Laterality Date   ABDOMINAL HYSTERECTOMY     "partial"   APPENDECTOMY     BIOPSY  11/17/2017   Procedure: BIOPSY;  Surgeon: Clarene Essex, MD;  Location: [facility];  Service: Endoscopy;;   COLONOSCOPY WITH PROPOFOL N/A 11/17/2017   Procedure: COLONOSCOPY WITH PROPOFOL;  Surgeon: Clarene Essex, MD;  Location: [facility];  Service: Endoscopy;  Laterality: N/A;   CRANIECTOMY FOR EXCISION OF ACOUSTIC NEUROMA     ESOPHAGOGASTRODUODENOSCOPY (EGD) WITH PROPOFOL N/A 10/28/2017   Procedure: ESOPHAGOGASTRODUODENOSCOPY (EGD) WITH PROPOFOL;  Surgeon: Clarene Essex, MD;  Location: [facility];  Service:  Endoscopy;  Laterality: N/A;   FOOT SURGERY Bilateral 04/18/2020   KNEE ARTHROPLASTY Right 06/05/2021   Procedure: COMPUTER ASSISTED TOTAL KNEE ARTHROPLASTY;  Surgeon: Rod Can, MD;  Location: [facility];  Service: Orthopedics;  Laterality: Right;   KNEE ARTHROSCOPY Left    LEFT HEART CATH AND CORONARY ANGIOGRAPHY N/A 11/24/2016   Procedure: LEFT HEART CATH AND CORONARY ANGIOGRAPHY;  Surgeon: Jettie Booze, MD;  Location: [facility];  Service: Cardiovascular;  Laterality: N/A;   MELANOMA EXCISION     "off my back"   NASAL SINUS SURGERY     TONSILLECTOMY     Patient Active Problem List   Diagnosis Date Noted   Degenerative arthritis of right knee 06/05/2021   Osteoarthritis of right knee 06/05/2021   NPH (normal pressure hydrocephalus) 08/19/2020   Urinary urgency 08/19/2020   Encounter for loop recorder check 06/09/2020   Loop recorder Biotronik Loop Recorder 05/23/2020 05/23/2020   Paroxysmal A-fib 03/24/2020   Gait disturbance 01/29/2020   Periodic limb movement disorder 08/30/2019   Nocturia 07/01/2018   Midline cystocele 06/08/2018   Recurrent UTI 06/08/2018   Vaginal vault prolapse 06/08/2018   Syncope and collapse    Postural dizziness with presyncope 03/17/2018   PAF (paroxysmal atrial  fibrillation) 03/17/2018   Memory loss 11/25/2017   Abnormal stress test    Hyperlipidemia 11/23/2016   GERD (gastroesophageal reflux disease) 11/23/2016   Polyneuropathy 10/30/2016   Bursitis, subacromial 10/30/2016   Numbness 10/30/2016   Arthritis, senescent 01/03/2016   Pain in joint, ankle and foot 02/28/2015   Dry mouth 08/07/2014   Dry eyes 08/07/2014   Other headache syndrome 06/25/2014   Sinusitis, chronic 06/25/2014   Neck pain 06/25/2014   OSA on CPAP 06/25/2014   Essential hypertension, benign 06/25/2014    REFERRING DIAG: Poor balance, Unsteady gait   THERAPY DIAG:  Repeated falls  Muscle weakness (generalized)  Other abnormalities of gait and  mobility  Rationale for Evaluation and Treatment Rehabilitation  PERTINENT HISTORY: See PMH above   PRECAUTIONS: Fall    SUBJECTIVE:                                                                                                                                                                                     SUBJECTIVE STATEMENT:  Patient reports she is feeling better today.    PAIN:  Are you having pain? Yes:  NPRS scale: 5 /10 Pain location: Low back, right toe Pain description: Burning Aggravating factors: Sweeping, standing, walking Relieving factors: Rest   OBJECTIVE: (objective measures completed at initial evaluation unless otherwise dated) PATIENT SURVEYS:  FOTO 36% functional status 07/08/22: 35%    POSTURE:             Rounded shoulders, generally kyphotic   LOWER EXTREMITY MMT:   MMT Right eval Left eval Rt / Lt 06/25/2022 Rt / Lt 07/13/2022  Hip flexion 4- 4-    Hip extension 2 2  3  / 3  Hip abduction 3 3  3  / 3  Hip adduction        Hip internal rotation        Hip external rotation        Knee flexion 4- 4 4- / 4   Knee extension 4- 4 4- / 4   Ankle dorsiflexion 4 4    Ankle plantarflexion 2 2    Ankle inversion        Ankle eversion         (Blank rows = not tested)   FUNCTIONAL TESTS:  5 times sit to stand: 22 seconds  07/06/2022: 19 seconds Timed up and go (TUG): 41 seconds with RW 2 minute walk test: 113 ft with RW  06/29/2022: 168 ft Berg Balance Scale: 34/56 (see eval for results)   GAIT: Distance walked: 113 ft Assistive device utilized: Walker - 2 wheeled Level of assistance: Modified independence Comments: Decreased gait speed,  bilateral knee flexion throughout gait,      TODAY'S TREATMENT:    [facility] Adult PT Treatment:                                                DATE: 07/15/2022 Therapeutic Exercise: NuStep L6 x 5 min with UE/LE while taking subjective LAQ with 5# 3 x 20 each Sit to stand with Airex without UE support 2 x  10 Standing hip abduction with red at knees 2 x 10 each Stand heel raises 2 x 10 Standing alternating march 2 x 20 Forward 4" step-up 2 x 10 each   [facility] Adult PT Treatment:                                                DATE: 07/13/2022 Therapeutic Exercise: NuStep L6 x 6 min with UE/LE while taking subjective Bridge 2 x 10 SLR 2 x 10 each Hooklying clamshell with green 2 x 15 Sidelying hip abduction 2 x 15 each LAQ with 5# 2 x 20 each Seated march with 5# 2 x 20 Sit to stand 2 x 10  [facility] Adult PT Treatment:                                                DATE: 07/08/22 Therapeutic Exercise: STS x 10 Heel raises Heel raises seated  Seated toe raises  Nustep L4 UE/LE x 6 minutes FOTO  35% Neuromuscular re-ed: Staggered stance trials  Rhomberg with head turns    PATIENT EDUCATION:  Education details: HEP Person educated: Patient Education method: Consulting civil engineer, Media planner, Corporate treasurer cues, Verbal cues Education comprehension: verbalized understanding, returned demonstration, verbal cues required, tactile cues required, and needs further education   HOME EXERCISE PROGRAM: Access Code: [id number]      ASSESSMENT: CLINICAL IMPRESSION: Patient tolerated therapy well with no adverse effects. Therapy limited due to patient arriving late. Patient able to tolerate standing exercises much better this visit without report of increase in toe pain. Therapy continues to focus primarily on progressing her strength and balance training. She did report occasional dizziness but this resolved with rest and water break. No changes made to HEP this visit. Patient would benefit from continued skilled PT to progress her strength and balance in order to improve walking and reduce fall risk.     OBJECTIVE IMPAIRMENTS: Abnormal gait, decreased activity tolerance, decreased balance, decreased endurance, difficulty walking, decreased strength, improper body mechanics, postural dysfunction, and pain.     ACTIVITY LIMITATIONS: carrying, lifting, bending, standing, squatting, stairs, transfers, dressing, hygiene/grooming, and locomotion level   PARTICIPATION LIMITATIONS: meal prep, cleaning, laundry, shopping, and community activity   PERSONAL FACTORS: Age, Fitness, Past/current experiences, and Time since onset of injury/illness/exacerbation are also affecting patient's functional outcome.      GOALS: Goals reviewed with patient? Yes   SHORT TERM GOALS: Target date: 07/13/2022   Patient will be I with initial HEP in order to progress with therapy. Baseline: HEP provided at eval 07/13/2022: progressing Goal status: ONGOING   2.  PT will review FOTO with patient by 3rd visit in order  to understand expected progress and outcome with therapy. Baseline: FOTO assessed at eval 07/01/2022: reviewed Goal status: MET   LONG TERM GOALS: Target date: 08/10/2022   Patient will be I with final HEP to maintain progress from PT. Baseline: HEP provided at eval Goal status: INITIAL   2.  Patient will report >/= 46% status on FOTO to indicate improved functional ability. Baseline: 36% functional status Goal status: INITIAL   3.  Patient will demonstrate BERG >/= 42/56 to indicate improved balance and reduced fall risk Baseline: 34/56 Goal status: INITIAL   4.  Patient will demonstrate 5xSTS </= 15 seconds to indicate improved LE strength and reduced fall risk Baseline: 22 seconds 07/06/2022: 19 seconds Goal status: PARTIALLY MET   5.  Patient will perform 2MWT >/= 185 ft using LRAD in order to improve household mobility Baseline: 113 ft using RW Goal status: INITIAL   6.  Patient will perform TUG </= 20 seconds using LRAD to indicate improved mobility and reduced fall risk Baseline: 41 seconds using RW Goal status: INITIAL     PLAN: PT FREQUENCY: 1-2x/week   PT DURATION: 8 weeks   PLANNED INTERVENTIONS: Therapeutic exercises, Therapeutic activity, Neuromuscular re-education, Balance  training, Gait training, Patient/Family education, Self Care, Joint mobilization, Aquatic Therapy, Cryotherapy, Moist heat, Manual therapy, and Re-evaluation   PLAN FOR NEXT SESSION: Review HEP and progress PRN, LE strengthening, balance training    Hilda Blades, PT, DPT, LAT, ATC 07/15/22  11:45 AM Phone: [phone] Fax: [phone]

## 2022-07-15 ENCOUNTER — Other Ambulatory Visit: Payer: Self-pay

## 2022-07-15 ENCOUNTER — Ambulatory Visit: Payer: Medicare PPO | Admitting: Physical Therapy

## 2022-07-15 ENCOUNTER — Encounter: Payer: Self-pay | Admitting: Physical Therapy

## 2022-07-15 DIAGNOSIS — R2689 Other abnormalities of gait and mobility: Secondary | ICD-10-CM

## 2022-07-15 DIAGNOSIS — R296 Repeated falls: Secondary | ICD-10-CM

## 2022-07-15 DIAGNOSIS — M6281 Muscle weakness (generalized): Secondary | ICD-10-CM | POA: Diagnosis not present

## 2022-07-16 DIAGNOSIS — Z0000 Encounter for general adult medical examination without abnormal findings: Secondary | ICD-10-CM | POA: Diagnosis not present

## 2022-07-16 DIAGNOSIS — F325 Major depressive disorder, single episode, in full remission: Secondary | ICD-10-CM | POA: Diagnosis not present

## 2022-07-16 DIAGNOSIS — R7303 Prediabetes: Secondary | ICD-10-CM | POA: Diagnosis not present

## 2022-07-16 DIAGNOSIS — R413 Other amnesia: Secondary | ICD-10-CM | POA: Diagnosis not present

## 2022-07-16 DIAGNOSIS — E7800 Pure hypercholesterolemia, unspecified: Secondary | ICD-10-CM | POA: Diagnosis not present

## 2022-07-16 DIAGNOSIS — Z7901 Long term (current) use of anticoagulants: Secondary | ICD-10-CM | POA: Diagnosis not present

## 2022-07-16 DIAGNOSIS — N390 Urinary tract infection, site not specified: Secondary | ICD-10-CM | POA: Diagnosis not present

## 2022-07-16 DIAGNOSIS — R2689 Other abnormalities of gait and mobility: Secondary | ICD-10-CM | POA: Diagnosis not present

## 2022-07-16 DIAGNOSIS — I480 Paroxysmal atrial fibrillation: Secondary | ICD-10-CM | POA: Diagnosis not present

## 2022-07-17 ENCOUNTER — Encounter: Payer: Self-pay | Admitting: Internal Medicine

## 2022-07-19 ENCOUNTER — Emergency Department (HOSPITAL_BASED_OUTPATIENT_CLINIC_OR_DEPARTMENT_OTHER): Payer: Medicare PPO

## 2022-07-19 ENCOUNTER — Encounter (HOSPITAL_BASED_OUTPATIENT_CLINIC_OR_DEPARTMENT_OTHER): Payer: Self-pay | Admitting: Emergency Medicine

## 2022-07-19 ENCOUNTER — Telehealth: Payer: Self-pay | Admitting: Cardiology

## 2022-07-19 ENCOUNTER — Emergency Department (HOSPITAL_BASED_OUTPATIENT_CLINIC_OR_DEPARTMENT_OTHER)
Admission: EM | Admit: 2022-07-19 | Discharge: 2022-07-19 | Disposition: A | Discharge status: Home / Self Care | Payer: Medicare PPO | Attending: Emergency Medicine | Admitting: Emergency Medicine

## 2022-07-19 DIAGNOSIS — X58XXXA Exposure to other specified factors, initial encounter: Secondary | ICD-10-CM | POA: Diagnosis not present

## 2022-07-19 DIAGNOSIS — S22000A Wedge compression fracture of unspecified thoracic vertebra, initial encounter for closed fracture: Secondary | ICD-10-CM

## 2022-07-19 DIAGNOSIS — G4733 Obstructive sleep apnea (adult) (pediatric): Secondary | ICD-10-CM | POA: Diagnosis present

## 2022-07-19 DIAGNOSIS — I2510 Atherosclerotic heart disease of native coronary artery without angina pectoris: Secondary | ICD-10-CM | POA: Diagnosis not present

## 2022-07-19 DIAGNOSIS — I7121 Aneurysm of the ascending aorta, without rupture: Secondary | ICD-10-CM | POA: Diagnosis not present

## 2022-07-19 DIAGNOSIS — R072 Precordial pain: Secondary | ICD-10-CM

## 2022-07-19 DIAGNOSIS — I700 Atherosclerosis of aorta: Secondary | ICD-10-CM

## 2022-07-19 DIAGNOSIS — Z7901 Long term (current) use of anticoagulants: Secondary | ICD-10-CM | POA: Insufficient documentation

## 2022-07-19 DIAGNOSIS — I480 Paroxysmal atrial fibrillation: Secondary | ICD-10-CM | POA: Diagnosis not present

## 2022-07-19 DIAGNOSIS — J9811 Atelectasis: Secondary | ICD-10-CM | POA: Diagnosis not present

## 2022-07-19 DIAGNOSIS — R55 Syncope and collapse: Secondary | ICD-10-CM | POA: Diagnosis not present

## 2022-07-19 DIAGNOSIS — S299XXA Unspecified injury of thorax, initial encounter: Secondary | ICD-10-CM | POA: Diagnosis present

## 2022-07-19 DIAGNOSIS — R0789 Other chest pain: Secondary | ICD-10-CM

## 2022-07-19 DIAGNOSIS — S22080A Wedge compression fracture of T11-T12 vertebra, initial encounter for closed fracture: Secondary | ICD-10-CM | POA: Diagnosis not present

## 2022-07-19 DIAGNOSIS — R079 Chest pain, unspecified: Secondary | ICD-10-CM | POA: Diagnosis not present

## 2022-07-19 LAB — COMPREHENSIVE METABOLIC PANEL
ALT: 15 U/L (ref 0–44)
AST: 28 U/L (ref 15–41)
Albumin: 3.8 g/dL (ref 3.5–5.0)
Alkaline Phosphatase: 41 U/L (ref 38–126)
Anion gap: 7 (ref 5–15)
BUN: 26 mg/dL — ABNORMAL HIGH (ref 8–23)
CO2: 25 mmol/L (ref 22–32)
Calcium: 8.3 mg/dL — ABNORMAL LOW (ref 8.9–10.3)
Chloride: 104 mmol/L (ref 98–111)
Creatinine, Ser: 0.81 mg/dL (ref 0.44–1.00)
GFR, Estimated: >60 mL/min (ref >60)
Glucose, Bld: 111 mg/dL — ABNORMAL HIGH (ref 70–99)
Potassium: 4.3 mmol/L (ref 3.5–5.1)
Sodium: 136 mmol/L (ref 135–145)
Total Bilirubin: 1.1 mg/dL (ref 0.3–1.2)
Total Protein: 6.9 g/dL (ref 6.5–8.1)

## 2022-07-19 LAB — CBC WITH DIFFERENTIAL/PLATELET
Abs Immature Granulocytes: 0.03 10*3/uL (ref 0.00–0.07)
Basophils Absolute: 0 10*3/uL (ref 0.0–0.1)
Basophils Relative: 0 %
Eosinophils Absolute: 0.1 10*3/uL (ref 0.0–0.5)
Eosinophils Relative: 1 %
HCT: 36.1 % (ref 36.0–46.0)
Hemoglobin: 12.1 g/dL (ref 12.0–15.0)
Immature Granulocytes: 0 %
Lymphocytes Relative: 17 %
Lymphs Abs: 1.3 10*3/uL (ref 0.7–4.0)
MCH: 31.7 pg (ref 26.0–34.0)
MCHC: 33.5 g/dL (ref 30.0–36.0)
MCV: 94.5 fL (ref 80.0–100.0)
Monocytes Absolute: 0.7 10*3/uL (ref 0.1–1.0)
Monocytes Relative: 9 %
Neutro Abs: 5.7 10*3/uL (ref 1.7–7.7)
Neutrophils Relative %: 73 %
Platelets: 172 10*3/uL (ref 150–400)
RBC: 3.82 MIL/uL — ABNORMAL LOW (ref 3.87–5.11)
RDW: 13.2 % (ref 11.5–15.5)
WBC: 7.9 10*3/uL (ref 4.0–10.5)
nRBC: 0 % (ref 0.0–0.2)

## 2022-07-19 LAB — TROPONIN I (HIGH SENSITIVITY)
Troponin I (High Sensitivity): 16 ng/L (ref <18)
Troponin I (High Sensitivity): 17 ng/L (ref <18)

## 2022-07-19 MED ORDER — IOHEXOL 350 MG/ML SOLN
75.0000 mL | Freq: Once | INTRAVENOUS | Status: AC | PRN
  Administered 2022-07-19: 75 mL via INTRAVENOUS

## 2022-07-19 NOTE — Discharge Instructions (Signed)
Contact a health care provider if: You continue to have episodes of near fainting. Get help right away if: You faint. You have any of these symptoms that may indicate trouble with your heart: Fast or irregular heartbeats (palpitations). Unusual pain in your chest, abdomen, or back. Shortness of breath. You have a seizure. You have a severe headache. You are confused. You have vision problems. You have severe weakness or trouble walking. You are bleeding from your mouth or rectum, or have black or tarry stool. 

## 2022-07-19 NOTE — ED Notes (Signed)
Ambulated the patient. She denied chest pain or shortness of breath.

## 2022-07-19 NOTE — ED Provider Notes (Signed)
[place] [facility] Provider Note   CSN: [id number] Arrival date & time: 07/19/22  1216     History  Chief Complaint  Patient presents with   Chest Pain    Cynthia [name] is a 82 y.o. female who has a past medical history of paroxysmal atrial fibrillation chronically anticoagulated on Eliquis for which she is compliant.  She sees Dr. Evlyn CourierJ. Gangi for cardiology and has a history of previous episodes of syncope and is status post loop recorder evaluation.  Patient's last cardiac catheterization was in 2018 which showed no significant vessel disease.  Patient is here for near syncopal event patient `   Chest Pain      Home Medications Prior to Admission medications   Medication Sig Start Date End Date Taking? Authorizing Provider  acetaminophen (TYLENOL) 500 MG tablet Take 1,000 mg by mouth at bedtime.    [provider]  Alpha-Lipoic Acid (LIPOIC ACID PO) Take 600 mg by mouth daily.    [provider]  amiodarone (PACERONE) 200 MG tablet Take 1 tablet (200 mg total) by mouth daily. 09/10/21   Cantwell, Celeste C, PA-C  apixaban (ELIQUIS) 5 MG TABS tablet Take 1 tablet (5 mg total) by mouth 2 (two) times daily. 04/15/22   Yates DecampGanji, Jay, MD  atorvastatin (LIPITOR) 10 MG tablet TAKE 1 TABLET BY MOUTH EVERY DAY 03/10/21   Cantwell, Celeste C, PA-C  AZO-CRANBERRY PO Take 1 tablet by mouth daily.    [provider]  Calcium Carbonate-Vitamin D3 600-400 MG-UNIT TABS Take 1 tablet by mouth daily.    [provider]  cycloSPORINE (RESTASIS) 0.05 % ophthalmic emulsion Place 1 drop into both eyes daily.    [provider]  denosumab (PROLIA) 60 MG/ML SOSY injection Inject 60 mg into the skin every 6 (six) months.    [provider]  desmopressin (DDAVP) 0.2 MG tablet Take 3 tablets by mouth as needed.    [provider]  diltiazem (CARDIZEM CD) 120 MG 24 hr capsule Take 1 capsule (120 mg total) by mouth  daily. 07/14/22   Yates DecampGanji, Jay, MD  fluticasone (FLONASE) 50 MCG/ACT nasal spray Place 1 spray into the nose at bedtime.    [provider]  losartan (COZAAR) 100 MG tablet Take 50 mg by mouth every evening. 04/15/20   [provider]  meclizine (ANTIVERT) 25 MG tablet Take 25 mg by mouth daily as needed for dizziness.    [provider]  Melatonin 5 MG CAPS Take 5 mg by mouth at bedtime.     [provider]  metoprolol succinate (TOPROL-XL) 25 MG 24 hr tablet Take 1 tablet (25 mg total) by mouth daily. 04/24/22   Yates DecampGanji, Jay, MD  Multiple Vitamin (MULTIVITAMIN WITH MINERALS) TABS tablet Take 1-2 tablets by mouth daily. flintstone vitamins Chewable with Iron    [provider]  multivitamin-iron-minerals-folic acid (CENTRUM) chewable tablet Chew 1 tablet by mouth daily.    [provider]  nitroGLYCERIN (NITROLINGUAL) 0.4 MG/SPRAY spray Place 1 spray under the tongue every 5 (five) minutes x 3 doses as needed for chest pain. 03/19/21   Cantwell, Celeste C, PA-C  Omega-3 1000 MG CAPS Take 2,000 mg by mouth daily.     [provider]  ondansetron (ZOFRAN) 4 MG tablet Take 4 mg by mouth every 8 (eight) hours as needed for nausea or vomiting.    [provider]  pantoprazole (PROTONIX) 40 MG tablet Take 40 mg by  mouth daily before breakfast.  06/12/14   [provider]  sertraline (ZOLOFT) 25 MG tablet Take 25 mg by mouth daily.    [provider]  thyroid (ARMOUR) 60 MG tablet Take 60 mg by mouth daily before breakfast. 11/07/17   [provider]  traMADol (ULTRAM) 50 MG tablet Take 1 tablet (50 mg total) by mouth every 6 (six) hours as needed for severe pain. 06/08/21   Haus, Pearletha Furl, PA  traMADol (ULTRAM) 50 MG tablet Take 1 tablet (50 mg total) by mouth every 6 (six) hours as needed. 04/30/22   Arby Barrette, MD  vitamin C (ASCORBIC ACID) 500 MG tablet Take 500 mg by mouth daily.    [provider]       Allergies    Flecainide, Oxycodone, Trimethoprim, Alendronate sodium, Avelox [moxifloxacin hcl in nacl], Ciprofloxacin, and Other    Review of Systems   Review of Systems  Cardiovascular:  Positive for chest pain.    Physical Exam Updated Vital Signs BP 126/63   Pulse 65   Temp 98.2 F (36.8 C) (Oral)   Resp 19   Ht 5\' 5"  (1.651 m)   Wt 68.5 kg   SpO2 95%   BMI 25.13 kg/m  Physical Exam  ED Results / Procedures / Treatments   Labs (all labs ordered are listed, but only abnormal results are displayed) Labs Reviewed  CBC WITH DIFFERENTIAL/PLATELET - Abnormal; Notable for the following components:      Result Value   RBC 3.82 (*)    All other components within normal limits  COMPREHENSIVE METABOLIC PANEL - Abnormal; Notable for the following components:   Glucose, Bld 111 (*)    BUN 26 (*)    Calcium 8.3 (*)    All other components within normal limits  TROPONIN I (HIGH SENSITIVITY)  TROPONIN I (HIGH SENSITIVITY)    EKG None  Radiology DG Chest 2 View  Result Date: 07/19/2022 CLINICAL DATA:  chest pain EXAM: CHEST - 2 VIEW COMPARISON:  July 26, 2021 FINDINGS: The cardiomediastinal silhouette is unchanged in contour.Atherosclerotic calcifications of the tortuous thoracic aorta. Cardiac loop recorder. No pleural effusion. No pneumothorax. New LEFT lower lobe basilar platelike opacity. Visualized abdomen is unremarkable. Multilevel degenerative changes of the thoracic spine. IMPRESSION: New LEFT lower lobe basilar platelike opacity. Differential considerations include infection or atelectasis. Recommend follow-up PA and lateral chest radiograph 4 weeks after appropriate treatment to assess for resolution. Electronically Signed   By: Meda Klinefelter M.D.   On: 07/19/2022 13:31    Procedures Procedures    Medications Ordered in ED Medications - No data to display  ED Course/ Medical Decision Making/ A&P Clinical Course as of 07/19/22 1733  Sun Jul 19, 2022   1613 DG Chest 2 View [AH]  1704 CT Angio Chest PE W and/or Wo Contrast [AH]    Clinical Course User Index [AH] Arthor Captain, PA-C                             Medical Decision Making Patient here with near syncope and cp. The differential for syncope is extensive and includes, but is not limited to: arrythmia (Vtach, SVT, SSS, sinus arrest, AV block, bradycardia) aortic stenosis, AMI, HOCM, PE, atrial myxoma, pulmonary hypertension, orthostatic hypotension, (hypovolemia, drug effect, GB syndrome, micturition, cough, swall) carotid sinus sensitivity, Seizure, TIA/CVA, hypoglycemia,  Vertigo.  I ordered labs which shows troponin negative x 2, CBC without abnormality, CMP  shows mild with elevated BUN level of insignificant value,.  I ordered chest x-ray which showed potential opacity in the left lower lobe which is where she was having pain.  I ordered CT angio PE with and without contrast which showed no evidence of pulmonary embolus however did show a 4.1 cm ascending thoracic aortic aneurysm which was present on 1 CT dissection study done in 2019 and has been not changed since that time.  It also shows coronary atherosclerosis and a T11 compression deformity.  I reviewed all of these findings with the patient as well as the patient's daughter via phone.  I discussed the findings with Dr. Encarnacion Chu feels that this does not represent any ACS.  She has normal troponins, nonischemic EKG and has a recent stress test that was not abnormal.  Patient was able to ambulate here in the emergency department without any pain or dizziness.  I do not see any evidence of leg herpes zoster or any other cause of the pain that she was having in her chest.  Think she likely had vasovagal event.  Patient will need follow-up for serial examination of the ascending thoracic aortic aneurysm and I have given her referral to CT surgery.  It is reassuring that it has not changed size in the past 5 years.  Patient  appears otherwise appropriate for discharge at this time.  She and her husband are comfortable with plan at this time  Amount and/or Complexity of Data Reviewed Labs: ordered. Radiology: ordered. Decision-making details documented in ED Course.  Risk Prescription drug management.           Final Clinical Impression(s) / ED Diagnoses Final diagnoses:  None    Rx / DC Orders ED Discharge Orders     None         Arthor Captain, PA-C 07/19/22 1930    Tegeler, Canary Brim, MD 07/19/22 2026

## 2022-07-19 NOTE — ED Triage Notes (Signed)
Pt c/o pain to LT chest under breast w/ diaphoresis that started this morning; also felt lightheaded;  daughter also reports pt was incontinent of stool

## 2022-07-19 NOTE — Telephone Encounter (Signed)
ON-CALL CARDIOLOGY 07/19/22  Patient's name: Cynthia [name].   MRN: [id number].    DOB: 10-09-1940 Primary care provider: Merri Brunette, MD.  Interaction regarding this patient's care today: Patient called answered service regarding chest pain.   Started about back.  Left breast area Worse w/ deep breath  No improving factors.  Not brought on w/ exertion.  Sharp  5/10 No radiation.   She wants to be seen today.   Impression:   ICD-10-CM   1. Precordial pain  R07.2       No orders of the defined types were placed in this encounter.   No orders of the defined types were placed in this encounter.   Recommendations: Educated her that the office is closed.  If she is in that much discomfort she should go to the nearest ER via EMS.  Will have the office reach out to her tomorrow to arrange follow up w/ Dr. Melton Alar.  Answered her and her husband's questions and concern.    Telephone encounter total time: 8 minutes.   Tessa Lerner, [place], [facility]  Pager: [phone] Office: [phone]

## 2022-07-19 NOTE — Progress Notes (Signed)
ON-CALL CARDIOLOGY 07/19/22  Patient's name: Cynthia [name].   MRN: [id number].    DOB: Aug 22, 1940 Primary care provider: Merri Brunette, MD. Primary cardiologist: Dr. Yates Decamp  Referring provider: Arthor Captain, [facility], ED department  Interaction regarding this patient's care today: Went to [facility], ED for evaluation of left-sided chest pain.  Based on symptoms predominately noncardiac-more pleuritic.  EKG nonischemic.  High sensitive troponins negative x 2.  CT of the chest noted stable but notable aortic aneurysm, coronary artery atherosclerosis, aortic atherosclerosis.  ED physician assistants called to review her case and additional recommendations.  RADIOLOGY: CTA chest PE protocol 07/19/2022 1. No pulmonary emboli or acute abnormality. 2. Calcific coronary artery and aortic atherosclerosis. 3. 4.1 cm ascending thoracic aortic aneurysm. Recommend annual imaging followup by CTA or MRA. This recommendation follows 2010 ACCF/AHA/AATS/ACR/ASA/SCA/SCAI/SIR/STS/SVM Guidelines for the Diagnosis and Management of Patients with Thoracic Aortic Disease. Circulation. 2010; 121: J500-X381. Aortic aneurysm NOS (ICD10-I71.9)   Aortic Atherosclerosis (ICD10-I70.0).  Chest x-ray 07/19/2022 New LEFT lower lobe basilar platelike opacity. Differential considerations include infection or atelectasis.   Recommend follow-up PA and lateral chest radiograph 4 weeks after appropriate treatment to assess for resolution.  LABORATORY DATA:    Latest Ref Rng & Units 07/19/2022   12:48 PM 09/24/2021   11:49 AM 07/26/2021   10:17 AM  CBC  WBC 4.0 - 10.5 K/uL 7.9  6.7  8.9   Hemoglobin 12.0 - 15.0 g/dL 82.9  93.7  16.9   Hematocrit 36.0 - 46.0 % 36.1  34.4  36.2   Platelets 150 - 400 K/uL 172  271  192        Latest Ref Rng & Units 07/19/2022   12:48 PM 07/26/2021   10:17 AM 06/06/2021    3:23 AM  CMP  Glucose 70 - 99 mg/dL 678  938  101   BUN 8 - 23 mg/dL 26  17  24    Creatinine  0.44 - 1.00 mg/dL 7.51  0.25  8.52   Sodium 135 - 145 mmol/L 136  139  137   Potassium 3.5 - 5.1 mmol/L 4.3  3.9  4.0   Chloride 98 - 111 mmol/L 104  106  108   CO2 22 - 32 mmol/L 25  26  23    Calcium 8.9 - 10.3 mg/dL 8.3  9.5  8.6   Total Protein 6.5 - 8.1 g/dL 6.9     Total Bilirubin 0.3 - 1.2 mg/dL 1.1     Alkaline Phos 38 - 126 U/L 41     AST 15 - 41 U/L 28     ALT 0 - 44 U/L 15       Lipid Panel     Component Value Date/Time   CHOL 165 11/24/2016 0040   TRIG 88 11/24/2016 0040   HDL 61 11/24/2016 0040   CHOLHDL 2.7 11/24/2016 0040   VLDL 18 11/24/2016 0040   LDLCALC 86 11/24/2016 0040    No components found for: "NTPROBNP" No results for input(s): "PROBNP" in the last 8760 hours. No results for input(s): "TSH" in the last 8760 hours.  BMP Recent Labs    07/26/21 1017 07/19/22 1248  NA 139 136  K 3.9 4.3  CL 106 104  CO2 26 25  GLUCOSE 109* 111*  BUN 17 26*  CREATININE 0.78 0.81  CALCIUM 9.5 8.3*  GFRNONAA >60 >60    HEMOGLOBIN A1C Lab Results  Component Value Date   HGBA1C 5.6 11/24/2016   MPG 114  11/24/2016    Cardiac Panel (last 3 results) Recent Labs    07/19/22 1248 07/19/22 1431  TROPONINIHS 16 17    CHOLESTEROL No results for input(s): "CHOL" in the last 8760 hours.  Hepatic Function Panel Recent Labs    07/19/22 1248  PROT 6.9  ALBUMIN 3.8  AST 28  ALT 15  ALKPHOS 41  BILITOT 1.1     Impression: Precordial pain Coronary calcification. Ascending aortic aneurysm, per CT scan. Paroxysmal atrial fibrillation Obstructive sleep apnea on CPAP  Recommendations: Her symptoms of precordial pain is not anginal equivalent and not suggestive of ACS.  High sensitive troponins are negative x 2.  EKG is nonischemic.  Though she has coronary artery calcification and aortic atherosclerosis she has had a heart catheterization in 2018 and most recent MPI in 2022.  Results reviewed as part of today's encounter.  There is a etiology to  her pain which is likely secondary to questionable pneumonia given the chest x-ray findings.  Recommend outpatient follow-up with primary cardiology within the next week to reevaluate her symptoms.  And if her current symptoms were to get worse to present back to the ED for further evaluation.  If for any other reason she is admitted please reach out for consultation.  Telephone encounter total time: 10 minutes  Delilah Shan [facility]  Pager:  [phone] Office: [phone]

## 2022-07-22 DIAGNOSIS — Z95818 Presence of other cardiac implants and grafts: Secondary | ICD-10-CM | POA: Diagnosis not present

## 2022-07-22 DIAGNOSIS — R55 Syncope and collapse: Secondary | ICD-10-CM | POA: Diagnosis not present

## 2022-07-22 DIAGNOSIS — Z4509 Encounter for adjustment and management of other cardiac device: Secondary | ICD-10-CM | POA: Diagnosis not present

## 2022-07-23 ENCOUNTER — Encounter: Payer: Self-pay | Admitting: Internal Medicine

## 2022-07-23 ENCOUNTER — Ambulatory Visit: Payer: Medicare PPO | Admitting: Internal Medicine

## 2022-07-23 VITALS — BP 128/60 | HR 54 | Ht 65.0 in | Wt 146.6 lb

## 2022-07-23 DIAGNOSIS — E782 Mixed hyperlipidemia: Secondary | ICD-10-CM

## 2022-07-23 DIAGNOSIS — I10 Essential (primary) hypertension: Secondary | ICD-10-CM | POA: Diagnosis not present

## 2022-07-23 DIAGNOSIS — I480 Paroxysmal atrial fibrillation: Secondary | ICD-10-CM | POA: Diagnosis not present

## 2022-07-23 DIAGNOSIS — I712 Thoracic aortic aneurysm, without rupture, unspecified: Secondary | ICD-10-CM

## 2022-07-23 NOTE — Progress Notes (Signed)
Primary Physician/Referring:  Merri BrunettePharr, Walter, MD  Patient ID: Cynthia SmallingGrace [name] [name], female    DOB: 01/14/1941, 82 y.o.   MRN: [id number]  Chief Complaint  Patient presents with   Chest Pain   Hospitalization Follow-up   HPI:    Cynthia SmallingGrace [name] [name]  is a 82 y.o. Caucasian female with hypertension, hyperlipidemia, frequent UTI,  paroxysmal atrial fibrillation, atrial tachycardia, obstructive sleep apnea on CPAP. Tolerating anticoagulation well. Flecainide discontinued due to dizziness. In the past she has had recurrent episodes of near syncope and severe orthostatic changes with recurrent UTI.  However patient with episodes of syncope and near syncope not associated with UTI.  Patient underwent surgery to her right foot 03/04/2020 and had an episode of syncope while at rehabilitation facility on 04/10/2020.   Recorder transmission 06/2021 revealed persistent atrial fibrillation with RVR, patient was therefore started on amiodarone.   She presents today for 7336-month follow-up.  She has been doing well since the last time she was here. No concerns or complaints. She denies chest pain, shortness of breath, palpitations, leg edema, orthopnea, PND, syncope.  Past Medical History:  Diagnosis Date   Acute GI bleeding 10/27/2017   Arthritis    "joints" (11/10/2017)   Dizziness    Dysrhythmia    afib   Encounter for loop recorder check 06/09/2020   GERD (gastroesophageal reflux disease)    GI bleed 11/06/2017   Hearing loss    History of blood transfusion 10/2017   Hyperlipidemia    Hypertension    Hyperthyroidism    Loop recorder [employer] Loop Recorder 05/23/2020 05/23/2020   Melanoma    "cut off my back"   Memory loss    Pneumonia    Sleep apnea    cpap   Syncope and collapse    Past Surgical History:  Procedure Laterality Date   ABDOMINAL HYSTERECTOMY     "partial"   APPENDECTOMY     BIOPSY  11/17/2017   Procedure: BIOPSY;  Surgeon: Vida RiggerMagod, Marc, MD;  Location: [facility];  Service:  Endoscopy;;   COLONOSCOPY WITH PROPOFOL N/A 11/17/2017   Procedure: COLONOSCOPY WITH PROPOFOL;  Surgeon: Vida RiggerMagod, Marc, MD;  Location: [facility];  Service: Endoscopy;  Laterality: N/A;   CRANIECTOMY FOR EXCISION OF ACOUSTIC NEUROMA     ESOPHAGOGASTRODUODENOSCOPY (EGD) WITH PROPOFOL N/A 10/28/2017   Procedure: ESOPHAGOGASTRODUODENOSCOPY (EGD) WITH PROPOFOL;  Surgeon: Vida RiggerMagod, Marc, MD;  Location: [facility];  Service: Endoscopy;  Laterality: N/A;   FOOT SURGERY Bilateral 04/18/2020   KNEE ARTHROPLASTY Right 06/05/2021   Procedure: COMPUTER ASSISTED TOTAL KNEE ARTHROPLASTY;  Surgeon: Samson FredericSwinteck, Brian, MD;  Location: [facility];  Service: Orthopedics;  Laterality: Right;   KNEE ARTHROSCOPY Left    LEFT HEART CATH AND CORONARY ANGIOGRAPHY N/A 11/24/2016   Procedure: LEFT HEART CATH AND CORONARY ANGIOGRAPHY;  Surgeon: Corky CraftsVaranasi, Jayadeep S, MD;  [facility];  Service: Cardiovascular;  Laterality: N/A;   MELANOMA EXCISION     "off my back"   NASAL SINUS SURGERY     TONSILLECTOMY     Family History  Problem Relation Age of Onset   Congestive Heart Failure Mother    Heart disease Father        History of heart attacks at a later age.     Heart disease Brother    Hearing loss Brother     Social History   Tobacco Use   Smoking status: Former    Years: 2    Types: Cigarettes    Quit date:  1980    Years since quitting: 44.3   Smokeless tobacco: Never   Tobacco comments:    11/10/2017 "only smoked when I was on my period"  Substance Use Topics   Alcohol use: Yes    Alcohol/week: 1.0 standard drink of alcohol    Types: 1 Glasses of wine per week    Comment: wine on occassionally  Marital Status: Married   ROS  Review of Systems  Cardiovascular:  Negative for chest pain, claudication, dyspnea on exertion, leg swelling, near-syncope, orthopnea, palpitations, paroxysmal nocturnal dyspnea and syncope.  Respiratory:  Negative for shortness of breath.   Neurological:  Negative for  dizziness.   Objective  Blood pressure 128/60, pulse (!) 54, height 5\' 5"  (1.651 m), weight 146 lb 9.6 oz (66.5 kg), SpO2 91 %.     07/23/2022   11:49 AM 07/19/2022    5:30 PM 07/19/2022    5:20 PM  Vitals with BMI  Height 5\' 5"     Weight 146 lbs 10 oz    BMI 24.4    Systolic 128 132   Diastolic 60 74   Pulse 54 74 77    Physical Exam Vitals reviewed.  Neck:     Vascular: No carotid bruit or JVD.  Cardiovascular:     Rate and Rhythm: Normal rate and regular rhythm.     Pulses: Intact distal pulses.     Heart sounds: Normal heart sounds, S1 normal and S2 normal. No murmur heard.    No gallop.  Pulmonary:     Effort: Pulmonary effort is normal. No respiratory distress.     Breath sounds: Normal breath sounds. No wheezing, rhonchi or rales.  Musculoskeletal:     Right lower leg: No edema.     Left lower leg: No edema.  Neurological:     Mental Status: She is alert.     Laboratory examination:   Recent Labs    07/26/21 1017 07/19/22 1248  NA 139 136  K 3.9 4.3  CL 106 104  CO2 26 25  GLUCOSE 109* 111*  BUN 17 26*  CREATININE 0.78 0.81  CALCIUM 9.5 8.3*  GFRNONAA >60 >60   estimated creatinine clearance is 49.8 mL/min (by C-G formula based on SCr of 0.81 mg/dL).     Latest Ref Rng & Units 07/19/2022   12:48 PM 07/26/2021   10:17 AM 06/06/2021    3:23 AM  CMP  Glucose 70 - 99 mg/dL 170  017  494   BUN 8 - 23 mg/dL 26  17  24    Creatinine 0.44 - 1.00 mg/dL 4.96  7.59  1.63   Sodium 135 - 145 mmol/L 136  139  137   Potassium 3.5 - 5.1 mmol/L 4.3  3.9  4.0   Chloride 98 - 111 mmol/L 104  106  108   CO2 22 - 32 mmol/L 25  26  23    Calcium 8.9 - 10.3 mg/dL 8.3  9.5  8.6   Total Protein 6.5 - 8.1 g/dL 6.9     Total Bilirubin 0.3 - 1.2 mg/dL 1.1     Alkaline Phos 38 - 126 U/L 41     AST 15 - 41 U/L 28     ALT 0 - 44 U/L 15         Latest Ref Rng & Units 07/19/2022   12:48 PM 09/24/2021   11:49 AM 07/26/2021   10:17 AM  CBC  WBC 4.0 - 10.5 K/uL 7.9  6.7  8.9    Hemoglobin 12.0 - 15.0 g/dL 21.3  08.6  57.8   Hematocrit 36.0 - 46.0 % 36.1  34.4  36.2   Platelets 150 - 400 K/uL 172  271  192    Lipid Panel     Component Value Date/Time   CHOL 165 11/24/2016 0040   TRIG 88 11/24/2016 0040   HDL 61 11/24/2016 0040   CHOLHDL 2.7 11/24/2016 0040   VLDL 18 11/24/2016 0040   LDLCALC 86 11/24/2016 0040   HEMOGLOBIN A1C Lab Results  Component Value Date   HGBA1C 5.6 11/24/2016   MPG 114 11/24/2016   TSH No results for input(s): "TSH" in the last 8760 hours.   External labs:   11/02/2021: Sodium 142, potassium 4.5, glucose 100, BUN 31, creatinine 0.81, EGFR 69 Hemoglobin 11.6, hematocrit 35.1, MCV 94.6, platelets 223 TSH 1.45 Hemoglobin A1c 5.8%  05/02/2021: Cholesterol 155, triglycerides 77, HDL 74, LDL 66  Allergies   Allergies  Allergen Reactions   Flecainide Other (See Comments)    Severe dizziness   Oxycodone Other (See Comments)    uphoria   Trimethoprim Other (See Comments)    Other reaction(s): hives   Alendronate Sodium Nausea Only   Avelox [Moxifloxacin Hcl In Nacl] Other (See Comments)    Unknown reaction   Ciprofloxacin Other (See Comments)    Other reaction(s): multiple medication interactions   Other Other (See Comments)    Avelox+cymbalta Together Other reaction(s): flu like symptoms    Medications Prior to Visit:   Outpatient Medications Prior to Visit  Medication Sig Dispense Refill   acetaminophen (TYLENOL) 500 MG tablet Take 1,000 mg by mouth at bedtime.     Alpha-Lipoic Acid (LIPOIC ACID PO) Take 600 mg by mouth daily.     amiodarone (PACERONE) 200 MG tablet Take 1 tablet (200 mg total) by mouth daily. 90 tablet 3   apixaban (ELIQUIS) 5 MG TABS tablet Take 1 tablet (5 mg total) by mouth 2 (two) times daily. 60 tablet 6   atorvastatin (LIPITOR) 10 MG tablet TAKE 1 TABLET BY MOUTH EVERY DAY 90 tablet 3   AZO-CRANBERRY PO Take 1 tablet by mouth daily.     Calcium Carbonate-Vitamin D3 600-400 MG-UNIT TABS  Take 1 tablet by mouth daily.     cephALEXin (KEFLEX) 250 MG capsule Take 250 mg by mouth 4 (four) times daily.     cycloSPORINE (RESTASIS) 0.05 % ophthalmic emulsion Place 1 drop into both eyes daily.     denosumab (PROLIA) 60 MG/ML SOSY injection Inject 60 mg into the skin every 6 (six) months.     desmopressin (DDAVP) 0.2 MG tablet Take 3 tablets by mouth as needed.     diltiazem (CARDIZEM CD) 120 MG 24 hr capsule Take 1 capsule (120 mg total) by mouth daily. 90 capsule 1   fluticasone (FLONASE) 50 MCG/ACT nasal spray Place 1 spray into the nose at bedtime.     losartan (COZAAR) 100 MG tablet Take 50 mg by mouth every evening.     meclizine (ANTIVERT) 25 MG tablet Take 25 mg by mouth daily as needed for dizziness.     Melatonin 5 MG CAPS Take 10 mg by mouth at bedtime.     metoprolol succinate (TOPROL-XL) 25 MG 24 hr tablet Take 1 tablet (25 mg total) by mouth daily. 90 tablet 3   multivitamin-iron-minerals-folic acid (CENTRUM) chewable tablet Chew 1 tablet by mouth daily.     nitroGLYCERIN (NITROLINGUAL) 0.4 MG/SPRAY spray Place 1 spray under  the tongue every 5 (five) minutes x 3 doses as needed for chest pain. 12 g 1   Omega-3 1000 MG CAPS Take 2,000 mg by mouth daily.      ondansetron (ZOFRAN) 4 MG tablet Take 4 mg by mouth every 8 (eight) hours as needed for nausea or vomiting.     pantoprazole (PROTONIX) 40 MG tablet Take 40 mg by mouth daily before breakfast.   1   thyroid (ARMOUR) 60 MG tablet Take 60 mg by mouth daily before breakfast.     traMADol (ULTRAM) 50 MG tablet Take 1 tablet (50 mg total) by mouth every 6 (six) hours as needed for severe pain. 50 tablet 0   traMADol (ULTRAM) 50 MG tablet Take 1 tablet (50 mg total) by mouth every 6 (six) hours as needed. 20 tablet 0   vitamin C (ASCORBIC ACID) 500 MG tablet Take 500 mg by mouth daily.     Multiple Vitamin (MULTIVITAMIN WITH MINERALS) TABS tablet Take 1-2 tablets by mouth daily. flintstone vitamins Chewable with Iron      sertraline (ZOLOFT) 25 MG tablet Take 25 mg by mouth daily. (Patient not taking: Reported on 07/23/2022)     No facility-administered medications prior to visit.   Final Medications at End of Visit    Current Meds  Medication Sig   acetaminophen (TYLENOL) 500 MG tablet Take 1,000 mg by mouth at bedtime.   Alpha-Lipoic Acid (LIPOIC ACID PO) Take 600 mg by mouth daily.   amiodarone (PACERONE) 200 MG tablet Take 1 tablet (200 mg total) by mouth daily.   apixaban (ELIQUIS) 5 MG TABS tablet Take 1 tablet (5 mg total) by mouth 2 (two) times daily.   atorvastatin (LIPITOR) 10 MG tablet TAKE 1 TABLET BY MOUTH EVERY DAY   AZO-CRANBERRY PO Take 1 tablet by mouth daily.   Calcium Carbonate-Vitamin D3 600-400 MG-UNIT TABS Take 1 tablet by mouth daily.   cephALEXin (KEFLEX) 250 MG capsule Take 250 mg by mouth 4 (four) times daily.   cycloSPORINE (RESTASIS) 0.05 % ophthalmic emulsion Place 1 drop into both eyes daily.   denosumab (PROLIA) 60 MG/ML SOSY injection Inject 60 mg into the skin every 6 (six) months.   desmopressin (DDAVP) 0.2 MG tablet Take 3 tablets by mouth as needed.   diltiazem (CARDIZEM CD) 120 MG 24 hr capsule Take 1 capsule (120 mg total) by mouth daily.   fluticasone (FLONASE) 50 MCG/ACT nasal spray Place 1 spray into the nose at bedtime.   losartan (COZAAR) 100 MG tablet Take 50 mg by mouth every evening.   meclizine (ANTIVERT) 25 MG tablet Take 25 mg by mouth daily as needed for dizziness.   Melatonin 5 MG CAPS Take 10 mg by mouth at bedtime.   metoprolol succinate (TOPROL-XL) 25 MG 24 hr tablet Take 1 tablet (25 mg total) by mouth daily.   multivitamin-iron-minerals-folic acid (CENTRUM) chewable tablet Chew 1 tablet by mouth daily.   nitroGLYCERIN (NITROLINGUAL) 0.4 MG/SPRAY spray Place 1 spray under the tongue every 5 (five) minutes x 3 doses as needed for chest pain.   Omega-3 1000 MG CAPS Take 2,000 mg by mouth daily.    ondansetron (ZOFRAN) 4 MG tablet Take 4 mg by mouth every  8 (eight) hours as needed for nausea or vomiting.   pantoprazole (PROTONIX) 40 MG tablet Take 40 mg by mouth daily before breakfast.    thyroid (ARMOUR) 60 MG tablet Take 60 mg by mouth daily before breakfast.   traMADol (ULTRAM) 50 MG  tablet Take 1 tablet (50 mg total) by mouth every 6 (six) hours as needed for severe pain.   traMADol (ULTRAM) 50 MG tablet Take 1 tablet (50 mg total) by mouth every 6 (six) hours as needed.   vitamin C (ASCORBIC ACID) 500 MG tablet Take 500 mg by mouth daily.    Radiology:   No results found.  Cardiac Studies:  PCV ECHOCARDIOGRAM COMPLETE 12/19/2020 Normal LV systolic function with visual EF 60-65%. Left ventricle cavity is normal in size. Mild left ventricular hypertrophy. Normal global wall motion. Normal diastolic filling pattern, normal LAP. Mild (Grade I) mitral regurgitation. Mild calcification of the mitral valve annulus. Mild tricuspid regurgitation. No evidence of pulmonary hypertension. Compared to study 11/09/2017 no significant change.  PCV MYOCARDIAL PERFUSION WITH LEXISCAN 11/20/2020 Nondiagnostic ECG stress. Myocardial perfusion is normal. Overall LV systolic function is normal without regional wall motion abnormalities. Stress LV EF: 50%. No previous exam available for comparison. Low risk study   Event Monitor for 30 days Start date 07/01/2018 for syncope : A. Fib one episode, no symptoms reported. No heart block or significant bradycardia  Carotid duplex 03/27/2018:  Right Carotid: Velocities in the right ICA are consistent with a 1-39% stenosis. Left Carotid: Velocities in the left ICA are consistent with a 1-39% stenosis. Vertebrals:  Bilateral vertebral arteries demonstrate antegrade flow. Subclavians: Normal flow hemodynamics were seen in bilateral subclavian arteries.   Event Monitor 30 days 11/11/2017: Predominant rhythm is normal sinus rhythm. Symptoms of fatigue reveals normal sinus rhythm. Asymptomatic atrial fibrillation  and probable atypical atrial flutter noted on 11/13/2017, 11/18/2017 and 12/09/2017 at 3:00 AM. Occasional PACs. Arrhythmia/PVC burden 6%.   Echo 11/09/2017: Left ventricle: The cavity size was normal. Systolic function was   normal. The estimated ejection fraction was in the range of 60% to 65%. Wall motion was normal; there were no regional wall   motion abnormalities. The study is not technically sufficient to   allow evaluation of LV diastolic function.Pulmonary arteries: PA peak pressure: 32 mm Hg (S).   Coronary angiogram 11/24/2016:  Normal coronaries; tortuous LAD  Loop Recorder   Loop recorder implantation 05/23/2020  Scheduled Remote loop recorder check 06/04/2020: Unremarkable transmission. Occasional PVC. No heart block, no atrial fibrillation. No symptoms reported  Remote loop recorder transmission 02/18/2022:  Predominant rhythm is normal sinus rhythm.  No further atrial fibrillation since 07/04/2021.  AT/AF burden <0.1%. No heart block. No symptoms reported.  EKG:   EKG 03/04/2022: Sinus bradycardia at rate of 58 bpm.  Left axis deviation.  Left anterior fascicular block, incomplete right bundle branch block.  Nonspecific T wave abnormality.  Compared to previous EKG on 09/22/2021, no significant change.  Assessment     ICD-10-CM   1. Thoracic aortic aneurysm without rupture, unspecified part  I71.20 Ambulatory referral to Cardiothoracic Surgery    2. Mixed hyperlipidemia  E78.2     3. Essential hypertension, benign  I10     4. Paroxysmal atrial fibrillation  I48.0       No orders of the defined types were placed in this encounter.   There are no discontinued medications.  This patients CHA2DS2-VASc Score 4 (HTN, F, Age)and yearly risk of stroke 4.8%.   Recommendations:   Hildur Bayer  is a 82 y.o. Caucasian female with hypertension, hyperlipidemia, frequent UTI,  paroxysmal atrial fibrillation, atrial tachycardia, obstructive sleep apnea on CPAP. Tolerating  anticoagulation well. Flecainide discontinued due to dizziness. In the past she has had recurrent episodes of near syncope  and severe orthostatic changes with recurrent UTI.  However patient with episodes of syncope and near syncope not associated with UTI.  Patient underwent surgery to her right foot 03/04/2020 and had an episode of syncope while at rehabilitation facility on 04/10/2020.   Recorder transmission 06/2021 revealed persistent atrial fibrillation with RVR, patient was therefore started on amiodarone.   Paroxysmal atrial fibrillation (HCC) Enrolled in clinical trial of drug - OCEANIC-AF trial Asundexian vs. Apixaban EKG today showed sinus bradycardia.  No recurrence of A-fib. She continues on amiodarone 200 mg daily, metoprolol 25 mg daily, and diltiazem 120 mg daily.  Remote loop recorder transmission 02/18/2022:  Predominant rhythm is normal sinus rhythm.  No further atrial fibrillation since 07/04/2021.  AT/AF burden <0.1%. No heart block. No symptoms reported.  Patient is currently enrolled in a OCEANIC-AF trial Asundexian vs. Apixaban.  She continues to tolerate anticoagulation without bleeding diathesis.  Essential hypertension, benign Continue current cardiac medications. Encourage low-sodium diet, less than 2000 mg daily.   Mixed hyperlipidemia Independently reviewed and interpreted external labs, lipids are under excellent control. She continues on atorvastatin 10 mg daily without myalgias.   Follow-up in 6 months or sooner if needed.   Clotilde Dieter, DO Office: [phone] Pager: [phone]

## 2022-07-27 ENCOUNTER — Other Ambulatory Visit: Payer: Self-pay

## 2022-07-27 ENCOUNTER — Encounter: Payer: Self-pay | Admitting: Physical Therapy

## 2022-07-27 ENCOUNTER — Ambulatory Visit: Payer: Medicare PPO | Admitting: Physical Therapy

## 2022-07-27 DIAGNOSIS — Z09 Encounter for follow-up examination after completed treatment for conditions other than malignant neoplasm: Secondary | ICD-10-CM | POA: Diagnosis not present

## 2022-07-27 DIAGNOSIS — M6281 Muscle weakness (generalized): Secondary | ICD-10-CM | POA: Diagnosis not present

## 2022-07-27 DIAGNOSIS — N390 Urinary tract infection, site not specified: Secondary | ICD-10-CM | POA: Diagnosis not present

## 2022-07-27 DIAGNOSIS — R2689 Other abnormalities of gait and mobility: Secondary | ICD-10-CM

## 2022-07-27 DIAGNOSIS — R296 Repeated falls: Secondary | ICD-10-CM

## 2022-07-27 DIAGNOSIS — I7121 Aneurysm of the ascending aorta, without rupture: Secondary | ICD-10-CM | POA: Diagnosis not present

## 2022-07-27 DIAGNOSIS — R2681 Unsteadiness on feet: Secondary | ICD-10-CM | POA: Diagnosis not present

## 2022-07-27 DIAGNOSIS — R918 Other nonspecific abnormal finding of lung field: Secondary | ICD-10-CM | POA: Diagnosis not present

## 2022-07-27 NOTE — Therapy (Signed)
OUTPATIENT PHYSICAL THERAPY TREATMENT NOTE   Patient Name: Cynthia [name] MRN: [id number] DOB:[birth year], 82 y.o., female Today's Date: 07/27/2022  PCP: Merri Brunette, MD REFERRING PROVIDER: Fatima Sanger, FNP   END OF SESSION:   PT End of Session - 07/27/22 1505     Visit Number 9    Number of Visits 17    Date for PT Re-Evaluation 08/10/22    Authorization Type [employer] MCR    Authorization Time Period 06/25/2022 - 08/08/2022    Authorization - Visit Number 8    Authorization - Number of Visits 12    PT Start Time 1500   patient arrived late   PT Stop Time 1530    PT Time Calculation (min) 30 min    Activity Tolerance Patient tolerated treatment well    Behavior During Therapy [facility] for tasks assessed/performed                  Past Medical History:  Diagnosis Date   Acute GI bleeding 10/27/2017   Arthritis    "joints" (11/10/2017)   Dizziness    Dysrhythmia    afib   Encounter for loop recorder check 06/09/2020   GERD (gastroesophageal reflux disease)    GI bleed 11/06/2017   Hearing loss    History of blood transfusion 10/2017   Hyperlipidemia    Hypertension    Hyperthyroidism    Loop recorder Biotronik Loop Recorder 05/23/2020 05/23/2020   Melanoma    "cut off my back"   Memory loss    Pneumonia    Sleep apnea    cpap   Syncope and collapse    Past Surgical History:  Procedure Laterality Date   ABDOMINAL HYSTERECTOMY     "partial"   APPENDECTOMY     BIOPSY  11/17/2017   Procedure: BIOPSY;  Surgeon: Vida Rigger, MD;  Location: [facility];  Service: Endoscopy;;   COLONOSCOPY WITH PROPOFOL N/A 11/17/2017   Procedure: COLONOSCOPY WITH PROPOFOL;  Surgeon: Vida Rigger, MD;  Location: [facility];  Service: Endoscopy;  Laterality: N/A;   CRANIECTOMY FOR EXCISION OF ACOUSTIC NEUROMA     ESOPHAGOGASTRODUODENOSCOPY (EGD) WITH PROPOFOL N/A 10/28/2017   Procedure: ESOPHAGOGASTRODUODENOSCOPY (EGD) WITH PROPOFOL;  Surgeon: Vida Rigger, MD;  Location: [facility];  Service: Endoscopy;  Laterality: N/A;   FOOT SURGERY Bilateral 04/18/2020   KNEE ARTHROPLASTY Right 06/05/2021   Procedure: COMPUTER ASSISTED TOTAL KNEE ARTHROPLASTY;  Surgeon: Samson Frederic, MD;  Location: [facility];  Service: Orthopedics;  Laterality: Right;   KNEE ARTHROSCOPY Left    LEFT HEART CATH AND CORONARY ANGIOGRAPHY N/A 11/24/2016   Procedure: LEFT HEART CATH AND CORONARY ANGIOGRAPHY;  Surgeon: Corky Crafts, MD;  Location: [facility] INVASIVE CV LAB;  Service: Cardiovascular;  Laterality: N/A;   MELANOMA EXCISION     "off my back"   NASAL SINUS SURGERY     TONSILLECTOMY     Patient Active Problem List   Diagnosis Date Noted   Precordial pain 07/19/2022   Coronary artery calcification seen on computed tomography 07/19/2022   Aneurysm of ascending aorta without rupture 07/19/2022   Degenerative arthritis of right knee 06/05/2021   Osteoarthritis of right knee 06/05/2021   NPH (normal pressure hydrocephalus) 08/19/2020   Urinary urgency 08/19/2020   Encounter for loop recorder check 06/09/2020   Loop recorder Biotronik Loop Recorder 05/23/2020 05/23/2020   Paroxysmal atrial fibrillation 03/24/2020   Gait disturbance 01/29/2020   Periodic limb movement disorder 08/30/2019   Nocturia 07/01/2018   Midline cystocele  06/08/2018   Recurrent UTI 06/08/2018   Vaginal vault prolapse 06/08/2018   Syncope and collapse    Postural dizziness with presyncope 03/17/2018   PAF (paroxysmal atrial fibrillation) 03/17/2018   Memory loss 11/25/2017   Abnormal stress test    Hyperlipidemia 11/23/2016   GERD (gastroesophageal reflux disease) 11/23/2016   Polyneuropathy 10/30/2016   Bursitis, subacromial 10/30/2016   Numbness 10/30/2016   Arthritis, senescent 01/03/2016   Pain in joint, ankle and foot 02/28/2015   Dry mouth 08/07/2014   Dry eyes 08/07/2014   Other headache syndrome 06/25/2014   Sinusitis, chronic 06/25/2014   Neck pain 06/25/2014   Obstructive sleep apnea  06/25/2014   Essential hypertension, benign 06/25/2014    REFERRING DIAG: Poor balance, Unsteady gait   THERAPY DIAG:  Repeated falls  Muscle weakness (generalized)  Other abnormalities of gait and mobility  Rationale for Evaluation and Treatment Rehabilitation  PERTINENT HISTORY: See PMH above   PRECAUTIONS: Fall    SUBJECTIVE:                                                                                                                                                                                     SUBJECTIVE STATEMENT:  Patient reports she is doing alright. She does feel like her balance is mostly challenged when she is turning to the side while standing.  PAIN:  Are you having pain? Yes:  NPRS scale: 5 /10 Pain location: Low back, right toe Pain description: Burning Aggravating factors: Sweeping, standing, walking Relieving factors: Rest   OBJECTIVE: (objective measures completed at initial evaluation unless otherwise dated) PATIENT SURVEYS:  FOTO 36% functional status 07/08/22: 35%    POSTURE:             Rounded shoulders, generally kyphotic   LOWER EXTREMITY MMT:   MMT Right eval Left eval Rt / Lt 06/25/2022 Rt / Lt 07/13/2022  Hip flexion 4- 4-    Hip extension 2 2  3  / 3  Hip abduction 3 3  3  / 3  Hip adduction        Hip internal rotation        Hip external rotation        Knee flexion 4- 4 4- / 4   Knee extension 4- 4 4- / 4   Ankle dorsiflexion 4 4    Ankle plantarflexion 2 2    Ankle inversion        Ankle eversion         (Blank rows = not tested)   FUNCTIONAL TESTS:  5 times sit to stand: 22 seconds  07/06/2022: 19 seconds Timed up and go (TUG): 41 seconds  with RW  07/27/2022: 32 seconds with RW 2 minute walk test: 113 ft with RW  06/29/2022: 168 ft Berg Balance Scale: 34/56 (see eval for results)   GAIT: Distance walked: 113 ft Assistive device utilized: Walker - 2 wheeled Level of assistance: Modified independence Comments:  Decreased gait speed, bilateral knee flexion throughout gait,      TODAY'S TREATMENT:    [facility] Adult PT Treatment:                                                DATE: 07/27/2022 Therapeutic Exercise: NuStep L6 x 5 min with UE/LE while taking subjective Sit to stand x 10, with overhead reach holding yellow med ball 2 x 10 LAQ with 5# 2 x 20 each Standing hip abduction with 5# 2 x 10 each Standing heel raises with 5# 2 x 10 Standing alternating march 2 x 20 Romberg stance with trunk rotation holding yellow med ball 2 x 10   [facility] Adult PT Treatment:                                                DATE: 07/15/2022 Therapeutic Exercise: NuStep L6 x 5 min with UE/LE while taking subjective LAQ with 5# 3 x 20 each Sit to stand with Airex without UE support 2 x 10 Standing hip abduction with red at knees 2 x 10 each Stand heel raises 2 x 10 Standing alternating march 2 x 20 Forward 4" step-up 2 x 10 each  [facility] Adult PT Treatment:                                                DATE: 07/13/2022 Therapeutic Exercise: NuStep L6 x 6 min with UE/LE while taking subjective Bridge 2 x 10 SLR 2 x 10 each Hooklying clamshell with green 2 x 15 Sidelying hip abduction 2 x 15 each LAQ with 5# 2 x 20 each Seated march with 5# 2 x 20 Sit to stand 2 x 10   PATIENT EDUCATION:  Education details: HEP Person educated: Patient Education method: Programmer, multimedia, Demonstration, Actor cues, Verbal cues Education comprehension: verbalized understanding, returned demonstration, verbal cues required, tactile cues required, and needs further education   HOME EXERCISE PROGRAM: Access Code: [email or web address]      ASSESSMENT: CLINICAL IMPRESSION: Patient tolerated therapy well with no adverse effects. Therapy limited due to patient arriving late. Therapy focused on progressing LE strength and standing stability with good tolerance. She does require consistent cueing for proper exercise technique and maintaining an upright  posture. She did demonstrate improved TUG this visit. No changes made to HEP this visit. Patient would benefit from continued skilled PT to progress her strength and balance in order to improve walking and reduce fall risk.     OBJECTIVE IMPAIRMENTS: Abnormal gait, decreased activity tolerance, decreased balance, decreased endurance, difficulty walking, decreased strength, improper body mechanics, postural dysfunction, and pain.    ACTIVITY LIMITATIONS: carrying, lifting, bending, standing, squatting, stairs, transfers, dressing, hygiene/grooming, and locomotion level   PARTICIPATION LIMITATIONS: meal prep, cleaning, laundry, shopping, and community activity   PERSONAL  FACTORS: Age, Fitness, Past/current experiences, and Time since onset of injury/illness/exacerbation are also affecting patient's functional outcome.      GOALS: Goals reviewed with patient? Yes   SHORT TERM GOALS: Target date: 07/13/2022   Patient will be I with initial HEP in order to progress with therapy. Baseline: HEP provided at eval 07/13/2022: progressing Goal status: ONGOING   2.  PT will review FOTO with patient by 3rd visit in order to understand expected progress and outcome with therapy. Baseline: FOTO assessed at eval 07/01/2022: reviewed Goal status: MET   LONG TERM GOALS: Target date: 08/10/2022   Patient will be I with final HEP to maintain progress from PT. Baseline: HEP provided at eval Goal status: INITIAL   2.  Patient will report >/= 46% status on FOTO to indicate improved functional ability. Baseline: 36% functional status Goal status: INITIAL   3.  Patient will demonstrate BERG >/= 42/56 to indicate improved balance and reduced fall risk Baseline: 34/56 Goal status: INITIAL   4.  Patient will demonstrate 5xSTS </= 15 seconds to indicate improved LE strength and reduced fall risk Baseline: 22 seconds 07/06/2022: 19 seconds Goal status: PARTIALLY MET   5.  Patient will perform >/= 185  ft using LRAD in order to improve household mobility Baseline: 113 ft using RW Goal status: INITIAL   6.  Patient will perform TUG </= 20 seconds using LRAD to indicate improved mobility and reduced fall risk Baseline: 41 seconds using RW 07/27/2022: 32 seconds with RW Goal status: INITIAL     PLAN: PT FREQUENCY: 1-2x/week   PT DURATION: 8 weeks   PLANNED INTERVENTIONS: Therapeutic exercises, Therapeutic activity, Neuromuscular re-education, Balance training, Gait training, Patient/Family education, Self Care, Joint mobilization, Aquatic Therapy, Cryotherapy, Moist heat, Manual therapy, and Re-evaluation   PLAN FOR NEXT SESSION: Review HEP and progress PRN, LE strengthening, balance training    Rosana Hoes, PT, DPT, LAT, ATC 07/27/22  3:34 PM Phone: [phone] Fax: [phone]

## 2022-07-29 ENCOUNTER — Ambulatory Visit (INDEPENDENT_AMBULATORY_CARE_PROVIDER_SITE_OTHER): Payer: Medicare PPO | Admitting: Podiatry

## 2022-07-29 ENCOUNTER — Ambulatory Visit (INDEPENDENT_AMBULATORY_CARE_PROVIDER_SITE_OTHER): Payer: Medicare PPO

## 2022-07-29 DIAGNOSIS — M79674 Pain in right toe(s): Secondary | ICD-10-CM

## 2022-07-29 DIAGNOSIS — B351 Tinea unguium: Secondary | ICD-10-CM | POA: Diagnosis not present

## 2022-07-29 DIAGNOSIS — M79675 Pain in left toe(s): Secondary | ICD-10-CM

## 2022-07-29 DIAGNOSIS — M2041 Other hammer toe(s) (acquired), right foot: Secondary | ICD-10-CM

## 2022-07-30 NOTE — Progress Notes (Signed)
Subjective:   [name], female   DOB: 82 y.o.   MRN: [id number]   HPI Patient presents with caregiver with concerns about the fifth digit right and whether or not amputation or any other kind of procedure could be done.  Also has severe nail disease 1-5 both feet that are thickened dystrophic they cannot cut and become painful with shoe gear   ROS      Objective:  Physical Exam  Neurovascular status intact with significant digital deformity digit 5 right which is underneath the fourth toe and being plantarflexed by this.  Moderate discomfort associated with it with severe nail disease 1-5 both feet that are very thickened dystrophic and painful     Assessment:  2 separate problems 1 being hammertoe deformity fifth right #2 mycotic nail infection 1-5 both feet     Plan:  H&P discussed with her and caregiver that the only thing I see for this toe that we could do would be amputation and I reviewed amputation of the fifth toe and they are going to think about it and it may be something in the future to do.  I debrided nailbeds 1-5 both feet no angiogenic bleeding  X-rays indicate there is significant rotation digit 5 right with plantarflexion component

## 2022-07-31 ENCOUNTER — Encounter: Payer: Self-pay | Admitting: Physical Therapy

## 2022-07-31 ENCOUNTER — Ambulatory Visit: Payer: Medicare PPO | Admitting: Physical Therapy

## 2022-07-31 DIAGNOSIS — M6281 Muscle weakness (generalized): Secondary | ICD-10-CM

## 2022-07-31 DIAGNOSIS — R2689 Other abnormalities of gait and mobility: Secondary | ICD-10-CM | POA: Diagnosis not present

## 2022-07-31 DIAGNOSIS — R296 Repeated falls: Secondary | ICD-10-CM

## 2022-07-31 NOTE — Therapy (Addendum)
OUTPATIENT PHYSICAL THERAPY TREATMENT NOTE   Progress Note Reporting Period 06/15/2022 to 07/31/2022  See note below for Objective Data and Assessment of Progress/Goals.     Patient Name: Cynthia [name] MRN: [id number] DOB:08-17-1940, 82 y.o., female Today's Date: 07/31/2022  PCP: Merri Brunette, MD REFERRING PROVIDER: Fatima Sanger, FNP   END OF SESSION:   PT End of Session - 07/31/22 1154     Visit Number 10    Number of Visits 17    Date for PT Re-Evaluation 08/10/22    Authorization Type [employer] M[employer]    Authorization Time Period 06/25/2022 - 08/08/2022    Authorization - Visit Number 9    Authorization - Number of Visits 12    PT Start Time 1146    PT Stop Time 1229    PT Time Calculation (min) 43 min                  Past Medical History:  Diagnosis Date   Acute GI bleeding 10/27/2017   Arthritis    "joints" (11/10/2017)   Dizziness    Dysrhythmia    afib   Encounter for loop recorder check 06/09/2020   GERD (gastroesophageal reflux disease)    GI bleed 11/06/2017   Hearing loss    History of blood transfusion 10/2017   Hyperlipidemia    Hypertension    Hyperthyroidism    Loop recorder Biotronik Loop Recorder 05/23/2020 05/23/2020   Melanoma    "cut off my back"   Memory loss    Pneumonia    Sleep apnea    cpap   Syncope and collapse    Past Surgical History:  Procedure Laterality Date   ABDOMINAL HYSTERECTOMY     "partial"   APPENDECTOMY     BIOPSY  11/17/2017   Procedure: BIOPSY;  Surgeon: Vida Rigger, MD;  Location: [facility];  Service: Endoscopy;;   COLONOSCOPY WITH PROPOFOL N/A 11/17/2017   Procedure: COLONOSCOPY WITH PROPOFOL;  Surgeon: Vida Rigger, MD;  Location: [facility];  Service: Endoscopy;  Laterality: N/A;   CRANIECTOMY FOR EXCISION OF ACOUSTIC NEUROMA     ESOPHAGOGASTRODUODENOSCOPY (EGD) WITH PROPOFOL N/A 10/28/2017   Procedure: ESOPHAGOGASTRODUODENOSCOPY (EGD) WITH PROPOFOL;  Surgeon: Vida Rigger, MD;  Location: [facility];  Service: Endoscopy;  Laterality: N/A;   FOOT SURGERY Bilateral 04/18/2020   KNEE ARTHROPLASTY Right 06/05/2021   Procedure: COMPUTER ASSISTED TOTAL KNEE ARTHROPLASTY;  Surgeon: Samson Frederic, MD;  Location: [facility];  Service: Orthopedics;  Laterality: Right;   KNEE ARTHROSCOPY Left    LEFT HEART CATH AND CORONARY ANGIOGRAPHY N/A 11/24/2016   Procedure: LEFT HEART CATH AND CORONARY ANGIOGRAPHY;  Surgeon: Corky Crafts, MD;  Location: [facility] INVASIVE CV LAB;  Service: Cardiovascular;  Laterality: N/A;   MELANOMA EXCISION     "off my back"   NASAL SINUS SURGERY     TONSILLECTOMY     Patient Active Problem List   Diagnosis Date Noted   Precordial pain 07/19/2022   Coronary artery calcification seen on computed tomography 07/19/2022   Aneurysm of ascending aorta without rupture 07/19/2022   Degenerative arthritis of right knee 06/05/2021   Osteoarthritis of right knee 06/05/2021   NPH (normal pressure hydrocephalus) 08/19/2020   Urinary urgency 08/19/2020   Encounter for loop recorder check 06/09/2020   Loop recorder Biotronik Loop Recorder 05/23/2020 05/23/2020   Paroxysmal atrial fibrillation 03/24/2020   Gait disturbance 01/29/2020   Periodic limb movement disorder 08/30/2019   Nocturia 07/01/2018   Midline cystocele 06/08/2018  Recurrent UTI 06/08/2018   Vaginal vault prolapse 06/08/2018   Syncope and collapse    Postural dizziness with presyncope 03/17/2018   PAF (paroxysmal atrial fibrillation) 03/17/2018   Memory loss 11/25/2017   Abnormal stress test    Hyperlipidemia 11/23/2016   GERD (gastroesophageal reflux disease) 11/23/2016   Polyneuropathy 10/30/2016   Bursitis, subacromial 10/30/2016   Numbness 10/30/2016   Arthritis, senescent 01/03/2016   Pain in joint, ankle and foot 02/28/2015   Dry mouth 08/07/2014   Dry eyes 08/07/2014   Other headache syndrome 06/25/2014   Sinusitis, chronic 06/25/2014   Neck pain 06/25/2014   Obstructive sleep apnea  06/25/2014   Essential hypertension, benign 06/25/2014    REFERRING DIAG: Poor balance, Unsteady gait   THERAPY DIAG:  Repeated falls  Muscle weakness (generalized)  Other abnormalities of gait and mobility  Rationale for Evaluation and Treatment Rehabilitation  PERTINENT HISTORY: See PMH above   PRECAUTIONS: Fall    SUBJECTIVE:                                                                                                                                                                                     SUBJECTIVE STATEMENT: I am stiff. Still working on the balance.    PAIN:  Are you having pain? Yes:  NPRS scale: 6 /10 Pain location: Low back, right toe Pain description: Burning Aggravating factors: Sweeping, standing, walking Relieving factors: Rest   OBJECTIVE: (objective measures completed at initial evaluation unless otherwise dated) PATIENT SURVEYS:  FOTO 36% functional status 07/08/22: 35%  07/31/22: 46%   POSTURE:             Rounded shoulders, generally kyphotic   LOWER EXTREMITY MMT:   MMT Right eval Left eval Rt / Lt 06/25/2022 Rt / Lt 07/13/2022  Hip flexion 4- 4-    Hip extension / 3  Hip abduction / 3  Hip adduction        Hip internal rotation        Hip external rotation        Knee flexion 4- 4 4- / 4   Knee extension 4- 4 4- / 4   Ankle dorsiflexion 4 4    Ankle plantarflexion 2 2    Ankle inversion        Ankle eversion         (Blank rows = not tested)   FUNCTIONAL TESTS:  5 times sit to stand: 22 seconds  07/06/2022: 19 seconds  07/31/22: 22 seconds Timed up and go (TUG): 41 seconds with RW  07/27/2022: 32 seconds with RW  07/31/22: 42.74 2  minute walk test: 113 ft with RW  06/29/2022: 168 ft  07/31/22: 155 feet  Berg Balance Scale: 34/56 (see eval for results)   GAIT: Distance walked: 113 ft Assistive device utilized: Environmental consultant - 2 wheeled Level of assistance: Modified independence Comments: Decreased gait speed,  bilateral knee flexion throughout gait,      TODAY'S TREATMENT:    [facility] Adult PT Treatment:                                                DATE: 07/28/22 Therapeutic Exercise: Nustep L5 UE/LE x 5 minutes  LAQ 5#  to fatigue March 5#  to fatigye  Therapeutic Activity: TUG 5 x STS    [facility] Adult PT Treatment:                                                DATE: 07/27/2022 Therapeutic Exercise: NuStep L6 x 5 min with UE/LE while taking subjective Sit to stand x 10, with overhead reach holding yellow med ball 2 x 10 LAQ with 5# 2 x 20 each Standing hip abduction with 5# 2 x 10 each Standing heel raises with 5# 2 x 10 Standing alternating march 2 x 20 Romberg stance with trunk rotation holding yellow med ball 2 x 10   [facility] Adult PT Treatment:                                                DATE: 07/15/2022 Therapeutic Exercise: NuStep L6 x 5 min with UE/LE while taking subjective LAQ with 5# 3 x 20 each Sit to stand with Airex without UE support 2 x 10 Standing hip abduction with red at knees 2 x 10 each Stand heel raises 2 x 10 Standing alternating march 2 x 20 Forward 4" step-up 2 x 10 each  [facility] Adult PT Treatment:                                                DATE: 07/13/2022 Therapeutic Exercise: NuStep L6 x 6 min with UE/LE while taking subjective Bridge 2 x 10 SLR 2 x 10 each Hooklying clamshell with green 2 x 15 Sidelying hip abduction 2 x 15 each LAQ with 5# 2 x 20 each Seated march with 5# 2 x 20 Sit to stand 2 x 10   PATIENT EDUCATION:  Education details: HEP Person educated: Patient Education method: Programmer, multimedia, Demonstration, Actor cues, Verbal cues Education comprehension: verbalized understanding, returned demonstration, verbal cues required, tactile cues required, and needs further education   HOME EXERCISE PROGRAM: Access Code: [email or web address]      ASSESSMENT: CLINICAL IMPRESSION: Patient tolerated therapy well with no adverse effects. Pt reports she  did not sleep well last night and is stiff in her back. 2 MWT improved. TUG and 5 x STS did not show much improvement today likely due to her reports of increased fatigue today. Her FOTO score has improved for balance confidence. LTG# 2  met.  She does require consistent cuing for upright posture with gait and for correct RW placement for safe transfers. No changes made to HEP this visit. She does not complete her current HEP but does attends a church based exercise program weekly.  Patient would benefit from continued skilled PT to progress her strength and balance in order to improve walking and reduce fall risk.     OBJECTIVE IMPAIRMENTS: Abnormal gait, decreased activity tolerance, decreased balance, decreased endurance, difficulty walking, decreased strength, improper body mechanics, postural dysfunction, and pain.    ACTIVITY LIMITATIONS: carrying, lifting, bending, standing, squatting, stairs, transfers, dressing, hygiene/grooming, and locomotion level   PARTICIPATION LIMITATIONS: meal prep, cleaning, laundry, shopping, and community activity   PERSONAL FACTORS: Age, Fitness, Past/current experiences, and Time since onset of injury/illness/exacerbation are also affecting patient's functional outcome.      GOALS: Goals reviewed with patient? Yes   SHORT TERM GOALS: Target date: 07/13/2022   Patient will be I with initial HEP in order to progress with therapy. Baseline: HEP provided at eval 07/13/2022: progressing 07/1922: Doing some, not as much as I should Goal status: ONGOING   2.  PT will review FOTO with patient by 3rd visit in order to understand expected progress and outcome with therapy. Baseline: FOTO assessed at eval 07/01/2022: reviewed Goal status: MET   LONG TERM GOALS: Target date: 08/10/2022   Patient will be I with final HEP to maintain progress from PT. Baseline: HEP provided at eval Goal status: ONGOING    2.  Patient will report >/= 46% status on FOTO to indicate  improved functional ability. Baseline: 36% functional status 07/31/22: 46% Goal status: MET   3.  Patient will demonstrate BERG >/= 42/56 to indicate improved balance and reduced fall risk Baseline: 34/56 Goal status: ONGOING   4.  Patient will demonstrate 5xSTS </= 15 seconds to indicate improved LE strength and reduced fall risk Baseline: 22 seconds 07/06/2022: 19 seconds 4/19/4: 22 sec  Goal status: ONGOING   5.  Patient will perform >/= 185 ft using LRAD in order to improve household mobility Baseline: 113 ft using RW 07/31/22: 155 feet  Goal status: ONGOING   6.  Patient will perform TUG </= 20 seconds using LRAD to indicate improved mobility and reduced fall risk Baseline: 41 seconds using RW 07/27/2022: 32 seconds with RW 07/31/22: 42.74 sec with RW  Goal status: ONGOING     PLAN: PT FREQUENCY: 1-2x/week   PT DURATION: 8 weeks   PLANNED INTERVENTIONS: Therapeutic exercises, Therapeutic activity, Neuromuscular re-education, Balance training, Gait training, Patient/Family education, Self Care, Joint mobilization, Aquatic Therapy, Cryotherapy, Moist heat, Manual therapy, and Re-evaluation   PLAN FOR NEXT SESSION: Review HEP and progress PRN, LE strengthening, balance training    Jannette Spanner, PTA 07/31/22 12:31 PM     *Updated progress noted* Rosana Hoes, PT, DPT, LAT, ATC 07/31/22  2:06 PM Phone: [phone] Fax: [phone]

## 2022-07-31 NOTE — Progress Notes (Unsigned)
[address]       [place] [address]             [phone]  Cynthia [name] [id number] [birth year]  History of Present Illness:  Cynthia [name] is a 82 yo female with history of HTN, HLD, Atrial Fibrillation on Eliquis, OSA, Recurrent UTI, Hyperthyroidism, SIADH.  The patient recently contacted her Cardiologist Dr. Jacinto Halim with complaints of chest pain along her left breast.  This worsened with deep inspiration but there were no other aggravating factors.  Dr. Jacinto Halim ordered a CTA for PE protocol which was negative for PE, but did show her Aorta to be measuring 4.1 cm.  Due to this she was referred to Cardiac surgery for surveillance.      Current Outpatient Medications on File Prior to Visit  Medication Sig Dispense Refill   acetaminophen (TYLENOL) 500 MG tablet Take 1,000 mg by mouth at bedtime.     Alpha-Lipoic Acid (LIPOIC ACID PO) Take 600 mg by mouth daily.     amiodarone (PACERONE) 200 MG tablet Take 1 tablet (200 mg total) by mouth daily. 90 tablet 3   apixaban (ELIQUIS) 5 MG TABS tablet Take 1 tablet (5 mg total) by mouth 2 (two) times daily. 60 tablet 6   atorvastatin (LIPITOR) 10 MG tablet TAKE 1 TABLET BY MOUTH EVERY DAY 90 tablet 3   AZO-CRANBERRY PO Take 1 tablet by mouth daily.     Calcium Carbonate-Vitamin D3 600-400 MG-UNIT TABS Take 1 tablet by mouth daily.     cephALEXin (KEFLEX) 250 MG capsule Take 250 mg by mouth 4 (four) times daily.     cycloSPORINE (RESTASIS) 0.05 % ophthalmic emulsion Place 1 drop into both eyes daily.     denosumab (PROLIA) 60 MG/ML SOSY injection Inject 60 mg into the skin every 6 (six) months.     desmopressin (DDAVP) 0.2 MG tablet Take 3 tablets by mouth as needed.     diltiazem (CARDIZEM CD) 120 MG 24 hr capsule Take 1 capsule (120 mg total) by mouth daily. 90 capsule 1   fluticasone (FLONASE) 50 MCG/ACT nasal spray Place 1 spray into the nose at bedtime.     losartan (COZAAR) 100 MG tablet Take 50 mg by mouth every  evening.     meclizine (ANTIVERT) 25 MG tablet Take 25 mg by mouth daily as needed for dizziness.     Melatonin 5 MG CAPS Take 10 mg by mouth at bedtime.     metoprolol succinate (TOPROL-XL) 25 MG 24 hr tablet Take 1 tablet (25 mg total) by mouth daily. 90 tablet 3   Multiple Vitamin (MULTIVITAMIN WITH MINERALS) TABS tablet Take 1-2 tablets by mouth daily. flintstone vitamins Chewable with Iron     multivitamin-iron-minerals-folic acid (CENTRUM) chewable tablet Chew 1 tablet by mouth daily.     nitroGLYCERIN (NITROLINGUAL) 0.4 MG/SPRAY spray Place 1 spray under the tongue every 5 (five) minutes x 3 doses as needed for chest pain. 12 g 1   Omega-3 1000 MG CAPS Take 2,000 mg by mouth daily.      ondansetron (ZOFRAN) 4 MG tablet Take 4 mg by mouth every 8 (eight) hours as needed for nausea or vomiting.     pantoprazole (PROTONIX) 40 MG tablet Take 40 mg by mouth daily before breakfast.   1   sertraline (ZOLOFT) 25 MG tablet Take 25 mg by mouth daily. (Patient not taking: Reported on 07/23/2022)     thyroid (ARMOUR) 60 MG tablet  Take 60 mg by mouth daily before breakfast.     traMADol (ULTRAM) 50 MG tablet Take 1 tablet (50 mg total) by mouth every 6 (six) hours as needed for severe pain. 50 tablet 0   traMADol (ULTRAM) 50 MG tablet Take 1 tablet (50 mg total) by mouth every 6 (six) hours as needed. 20 tablet 0   vitamin C (ASCORBIC ACID) 500 MG tablet Take 500 mg by mouth daily.     No current facility-administered medications on file prior to visit.     There were no vitals taken for this visit.  Physical Exam  CTA Results:  EXAM: CT ANGIOGRAPHY CHEST WITH CONTRAST   TECHNIQUE: Multidetector CT imaging of the chest was performed using the standard protocol during bolus administration of intravenous contrast. Multiplanar CT image reconstructions and MIPs were obtained to evaluate the vascular anatomy.   RADIATION DOSE REDUCTION: This exam was performed according to the departmental  dose-optimization program which includes automated exposure control, adjustment of the mA and/or kV according to patient size and/or use of iterative reconstruction technique.   CONTRAST:  75mL OMNIPAQUE IOHEXOL 350 MG/ML SOLN   COMPARISON:  Chest radiographs obtained earlier today. Chest CTA dated 07/26/2021.   FINDINGS: Cardiovascular: Atheromatous calcifications, including the coronary arteries and aorta. Borderline enlarged heart. Normally opacified pulmonary arteries with no pulmonary arterial filling defects seen. The ascending thoracic aorta measures 4.1 cm, previously 4 2 cm.   Mediastinum/Nodes: No enlarged mediastinal, hilar, or axillary lymph nodes. Thyroid gland, trachea, and esophagus demonstrate no significant findings.   Lungs/Pleura: Minimal bibasilar dependent atelectasis. No airspace consolidation or pleural fluid.   Upper Abdomen: Unremarkable.   Musculoskeletal: Bilateral shoulder degenerative changes. Thoracic and lower cervical spine degenerative changes. Mild progression of a superior endplate compression deformity involving the T11 vertebral body with interval sclerotic healing and no acute fracture lines visualized. This is an approximately 15% deformity. Left breast loop recorder.   Review of the MIP images confirms the above findings.   IMPRESSION: 1. No pulmonary emboli or acute abnormality. 2. Calcific coronary artery and aortic atherosclerosis. 3. 4.1 cm ascending thoracic aortic aneurysm. Recommend annual imaging followup by CTA or MRA. This recommendation follows 2010 ACCF/AHA/AATS/ACR/ASA/SCA/SCAI/SIR/STS/SVM Guidelines for the Diagnosis and Management of Patients with Thoracic Aortic Disease. Circulation. 2010; 121: W098-J191. Aortic aneurysm NOS (ICD10-I71.9)   Aortic Atherosclerosis (ICD10-I70.0).     Electronically Signed   By: Beckie Salts M.D.   On: 07/19/2022 15:46  A/P:  Ascending Aorta measuring 4.1 cm... (for the patient's  age this (31+ 0.16(80)=44 mm)- this normal for patient's age Atrial Fibrillation on Eliquis HTN HLD Hyperthyroidism SIADH? On Desmopressin   Plan: no surgical intervention indicated.. For patient's age aortic measurement of 4.1 would be considered normal.      Risk Modification:  Statin:  ***  Smoking cessation instruction/counseling given:Former Smoker  Patient was counseled on importance of Blood Pressure Control.  Despite Medical intervention if the patient notices persistently elevated blood pressure readings.  They are instructed to contact their Primary Care Physician  Please avoid use of Fluoroquinolones as this can potentially increase your risk of Aortic Rupture and/or Dissection  Patient educated on signs and symptoms of Aortic Dissection, handout also provided in AVS  Erin Barrett, PA-C 07/31/22

## 2022-08-03 ENCOUNTER — Ambulatory Visit: Payer: Medicare PPO | Admitting: Physical Therapy

## 2022-08-03 ENCOUNTER — Encounter: Payer: Self-pay | Admitting: Physical Therapy

## 2022-08-03 DIAGNOSIS — R2689 Other abnormalities of gait and mobility: Secondary | ICD-10-CM | POA: Diagnosis not present

## 2022-08-03 DIAGNOSIS — R296 Repeated falls: Secondary | ICD-10-CM | POA: Diagnosis not present

## 2022-08-03 DIAGNOSIS — M6281 Muscle weakness (generalized): Secondary | ICD-10-CM

## 2022-08-03 NOTE — Therapy (Signed)
OUTPATIENT PHYSICAL THERAPY TREATMENT NOTE     Patient Name: Cynthia [name] MRN: [id number] DOB:[birth year], 82 y.o., female Today's Date: 08/03/2022  PCP: Merri Brunette, MD REFERRING PROVIDER: Fatima Sanger, FNP   END OF SESSION:   PT End of Session - 08/03/22 1323     Visit Number 11    Number of Visits 17    Date for PT Re-Evaluation 08/10/22    Authorization Type [employer] MCR    Authorization Time Period 06/25/2022 - 08/08/2022    Authorization - Visit Number 10    Authorization - Number of Visits 12    PT Start Time 1317    PT Stop Time 1359    PT Time Calculation (min) 42 min                  Past Medical History:  Diagnosis Date   Acute GI bleeding 10/27/2017   Arthritis    "joints" (11/10/2017)   Dizziness    Dysrhythmia    afib   Encounter for loop recorder check 06/09/2020   GERD (gastroesophageal reflux disease)    GI bleed 11/06/2017   Hearing loss    History of blood transfusion 10/2017   Hyperlipidemia    Hypertension    Hyperthyroidism    Loop recorder Biotronik Loop Recorder 05/23/2020 05/23/2020   Melanoma    "cut off my back"   Memory loss    Pneumonia    Sleep apnea    cpap   Syncope and collapse    Past Surgical History:  Procedure Laterality Date   ABDOMINAL HYSTERECTOMY     "partial"   APPENDECTOMY     BIOPSY  11/17/2017   Procedure: BIOPSY;  Surgeon: Vida Rigger, MD;  Location: [facility];  Service: Endoscopy;;   COLONOSCOPY WITH PROPOFOL N/A 11/17/2017   Procedure: COLONOSCOPY WITH PROPOFOL;  Surgeon: Vida Rigger, MD;  Location: [facility];  Service: Endoscopy;  Laterality: N/A;   CRANIECTOMY FOR EXCISION OF ACOUSTIC NEUROMA     ESOPHAGOGASTRODUODENOSCOPY (EGD) WITH PROPOFOL N/A 10/28/2017   Procedure: ESOPHAGOGASTRODUODENOSCOPY (EGD) WITH PROPOFOL;  Surgeon: Vida Rigger, MD;  Location: [facility];  Service: Endoscopy;  Laterality: N/A;   FOOT SURGERY Bilateral 04/18/2020   KNEE ARTHROPLASTY Right 06/05/2021    Procedure: COMPUTER ASSISTED TOTAL KNEE ARTHROPLASTY;  Surgeon: Samson Frederic, MD;  Location: [facility];  Service: Orthopedics;  Laterality: Right;   KNEE ARTHROSCOPY Left    LEFT HEART CATH AND CORONARY ANGIOGRAPHY N/A 11/24/2016   Procedure: LEFT HEART CATH AND CORONARY ANGIOGRAPHY;  Surgeon: Corky Crafts, MD;  Location: [facility] INVASIVE CV LAB;  Service: Cardiovascular;  Laterality: N/A;   MELANOMA EXCISION     "off my back"   NASAL SINUS SURGERY     TONSILLECTOMY     Patient Active Problem List   Diagnosis Date Noted   Precordial pain 07/19/2022   Coronary artery calcification seen on computed tomography 07/19/2022   Aneurysm of ascending aorta without rupture 07/19/2022   Degenerative arthritis of right knee 06/05/2021   Osteoarthritis of right knee 06/05/2021   NPH (normal pressure hydrocephalus) 08/19/2020   Urinary urgency 08/19/2020   Encounter for loop recorder check 06/09/2020   Loop recorder Biotronik Loop Recorder 05/23/2020 05/23/2020   Paroxysmal atrial fibrillation 03/24/2020   Gait disturbance 01/29/2020   Periodic limb movement disorder 08/30/2019   Nocturia 07/01/2018   Midline cystocele 06/08/2018   Recurrent UTI 06/08/2018   Vaginal vault prolapse 06/08/2018   Syncope and collapse    Postural  dizziness with presyncope 03/17/2018   PAF (paroxysmal atrial fibrillation) 03/17/2018   Memory loss 11/25/2017   Abnormal stress test    Hyperlipidemia 11/23/2016   GERD (gastroesophageal reflux disease) 11/23/2016   Polyneuropathy 10/30/2016   Bursitis, subacromial 10/30/2016   Numbness 10/30/2016   Arthritis, senescent 01/03/2016   Pain in joint, ankle and foot 02/28/2015   Dry mouth 08/07/2014   Dry eyes 08/07/2014   Other headache syndrome 06/25/2014   Sinusitis, chronic 06/25/2014   Neck pain 06/25/2014   Obstructive sleep apnea 06/25/2014   Essential hypertension, benign 06/25/2014    REFERRING DIAG: Poor balance, Unsteady gait   THERAPY DIAG:   Repeated falls  Muscle weakness (generalized)  Other abnormalities of gait and mobility  Rationale for Evaluation and Treatment Rehabilitation  PERTINENT HISTORY: See PMH above   PRECAUTIONS: Fall    SUBJECTIVE:                                                                                                                                                                                     SUBJECTIVE STATEMENT: I did my church exercise class this morning online. Back pain is less today.    PAIN:  Are you having pain? Yes:  NPRS scale: 4 /10 Pain location: Low back, right toe Pain description: Burning Aggravating factors: Sweeping, standing, walking Relieving factors: Rest   OBJECTIVE: (objective measures completed at initial evaluation unless otherwise dated) PATIENT SURVEYS:  FOTO 36% functional status 07/08/22: 35%  07/31/22: 46%   POSTURE:             Rounded shoulders, generally kyphotic   LOWER EXTREMITY MMT:   MMT Right eval Left eval Rt / Lt 06/25/2022 Rt / Lt 07/13/2022  Hip flexion 4- 4-    Hip extension 2 2  3  / 3  Hip abduction 3 3  3  / 3  Hip adduction        Hip internal rotation        Hip external rotation        Knee flexion 4- 4 4- / 4   Knee extension 4- 4 4- / 4   Ankle dorsiflexion 4 4    Ankle plantarflexion 2 2    Ankle inversion        Ankle eversion         (Blank rows = not tested)   FUNCTIONAL TESTS:  5 times sit to stand: 22 seconds  07/06/2022: 19 seconds  07/31/22: 22 seconds Timed up and go (TUG): 41 seconds with RW  07/27/2022: 32 seconds with RW  07/31/22: 42.74  08/03/22: 31.5 sec 2 minute walk test: 113 ft with RW  06/29/2022: 168 ft  07/31/22: 155 feet   08/03/22: 171 feet Berg Balance Scale: 34/56 (see eval for results) BERG Balance Scale 45/56   GAIT: Distance walked: 113 ft Assistive device utilized: Walker - 2 wheeled Level of assistance: Modified independence Comments: Decreased gait speed, bilateral knee flexion  throughout gait,      TODAY'S TREATMENT:    [facility] Adult PT Treatment:                                                DATE: 08/03/22 Therapeutic Exercise: Nustep L5 x 5 minutes   Therapeutic Activity:  5 x STS 29 s 2 MWT 171 feet BERG BALANCE TEST Sitting to Standing: 4.      Stands without using hands and stabilize independently Standing Unsupported: 4.      Stands safely for 2 minutes Sitting Unsupported: 4.     Sits for 2 minutes independently Standing to Sitting: 4.     Sits safely with minimal use of hands Transfers: 4.     Transfers safely with minor use of hands Standing with eyes closed: 4.     Stands safely for 10 seconds  Standing with feet together: 3.     Stands for 1 minute with supervision Reaching forward with outstretched arm: 3.     Reaches forward 5 inches Retrieving object from the floor: 4.      Able to pick up easily and safely Turning to look behind: 4.     Looks behind from both sides and weight shifts well Turning 360 degrees: 2.     Able to turn slowly, but safely Place alternate foot on stool: 2.     4 steps without assistance/supervision Standing with one foot in front: 2.     Independent small step for 30 seconds Standing on one foot: 1.     Holds <3 seconds  Total Score: 45/56     [facility] Adult PT Treatment:                                                DATE: 07/28/22 Therapeutic Exercise: Nustep L5 UE/LE x 5 minutes  LAQ 5#  to fatigue March 5#  to fatigye  Therapeutic Activity: TUG 5 x STS    [facility] Adult PT Treatment:                                                DATE: 07/27/2022 Therapeutic Exercise: NuStep L6 x 5 min with UE/LE while taking subjective Sit to stand x 10, with overhead reach holding yellow med ball 2 x 10 LAQ with 5# 2 x 20 each Standing hip abduction with 5# 2 x 10 each Standing heel raises with 5# 2 x 10 Standing alternating march 2 x 20 Romberg stance with trunk rotation holding yellow med ball 2 x 10   [facility] Adult  PT Treatment:  DATE: 07/15/2022 Therapeutic Exercise: NuStep L6 x 5 min with UE/LE while taking subjective LAQ with 5# 3 x 20 each Sit to stand with Airex without UE support 2 x 10 Standing hip abduction with red at knees 2 x 10 each Stand heel raises 2 x 10 Standing alternating march 2 x 20 Forward 4" step-up 2 x 10 each  [facility] Adult PT Treatment:                                                DATE: 07/13/2022 Therapeutic Exercise: NuStep L6 x 6 min with UE/LE while taking subjective Bridge 2 x 10 SLR 2 x 10 each Hooklying clamshell with green 2 x 15 Sidelying hip abduction 2 x 15 each LAQ with 5# 2 x 20 each Seated march with 5# 2 x 20 Sit to stand 2 x 10   PATIENT EDUCATION:  Education details: HEP Person educated: Patient Education method: Programmer, multimedia, Demonstration, Actor cues, Verbal cues Education comprehension: verbalized understanding, returned demonstration, verbal cues required, tactile cues required, and needs further education   HOME EXERCISE PROGRAM: Access Code: [email or web address]      ASSESSMENT: CLINICAL IMPRESSION: Patient tolerated therapy well with no adverse effects. She is feeling better today and her BERG score shows improvement from 34/56 to 45/56 meeting the LTG. 2 MWT and 5 x STS are not quite at targets but are improved. Less cues for RW with transfers and gait required today.  Patient would benefit from continued skilled PT to progress her strength and balance in order to improve walking and reduce fall risk.     OBJECTIVE IMPAIRMENTS: Abnormal gait, decreased activity tolerance, decreased balance, decreased endurance, difficulty walking, decreased strength, improper body mechanics, postural dysfunction, and pain.    ACTIVITY LIMITATIONS: carrying, lifting, bending, standing, squatting, stairs, transfers, dressing, hygiene/grooming, and locomotion level   PARTICIPATION LIMITATIONS: meal prep, cleaning, laundry,  shopping, and community activity   PERSONAL FACTORS: Age, Fitness, Past/current experiences, and Time since onset of injury/illness/exacerbation are also affecting patient's functional outcome.      GOALS: Goals reviewed with patient? Yes   SHORT TERM GOALS: Target date: 07/13/2022   Patient will be I with initial HEP in order to progress with therapy. Baseline: HEP provided at eval 07/13/2022: progressing 07/1922: Doing some, not as much as I should Goal status: ONGOING   2.  PT will review FOTO with patient by 3rd visit in order to understand expected progress and outcome with therapy. Baseline: FOTO assessed at eval 07/01/2022: reviewed Goal status: MET   LONG TERM GOALS: Target date: 08/10/2022   Patient will be I with final HEP to maintain progress from PT. Baseline: HEP provided at eval Goal status: ONGOING    2.  Patient will report >/= 46% status on FOTO to indicate improved functional ability. Baseline: 36% functional status 07/31/22: 46% Goal status: MET   3.  Patient will demonstrate BERG >/= 42/56 to indicate improved balance and reduced fall risk Baseline: 34/56 08/03/22: 45/56 Goal status: MET   4.  Patient will demonstrate 5xSTS </= 15 seconds to indicate improved LE strength and reduced fall risk Baseline: 22 seconds 07/06/2022: 19 seconds 4/19/4: 22 sec  Goal status: ONGOING   5.  Patient will perform >/= 185 ft using LRAD in order to improve household mobility Baseline: 113 ft using RW 07/31/22: 155 feet  08/03/22: 171 feet Goal status: ONGOING   6.  Patient will perform TUG </= 20 seconds using LRAD to indicate improved mobility and reduced fall risk Baseline: 41 seconds using RW 07/27/2022: 32 seconds with RW 07/31/22: 42.74 sec with RW 08/03/22: 31.5 sec with RW  Goal status: ONGOING     PLAN: PT FREQUENCY: 1-2x/week   PT DURATION: 8 weeks   PLANNED INTERVENTIONS: Therapeutic exercises, Therapeutic activity, Neuromuscular re-education, Balance  training, Gait training, Patient/Family education, Self Care, Joint mobilization, Aquatic Therapy, Cryotherapy, Moist heat, Manual therapy, and Re-evaluation   PLAN FOR NEXT SESSION: Review HEP and progress PRN, LE strengthening, balance training     Jannette Spanner, PTA 08/03/22 2:26 PM

## 2022-08-04 ENCOUNTER — Emergency Department (HOSPITAL_BASED_OUTPATIENT_CLINIC_OR_DEPARTMENT_OTHER)
Admission: EM | Admit: 2022-08-04 | Discharge: 2022-08-04 | Disposition: A | Discharge status: Home / Self Care | Payer: Medicare PPO | Attending: Emergency Medicine | Admitting: Emergency Medicine

## 2022-08-04 ENCOUNTER — Telehealth: Payer: Self-pay

## 2022-08-04 ENCOUNTER — Encounter (HOSPITAL_BASED_OUTPATIENT_CLINIC_OR_DEPARTMENT_OTHER): Payer: Self-pay | Admitting: Urology

## 2022-08-04 ENCOUNTER — Other Ambulatory Visit: Payer: Self-pay

## 2022-08-04 DIAGNOSIS — R682 Dry mouth, unspecified: Secondary | ICD-10-CM | POA: Diagnosis present

## 2022-08-04 DIAGNOSIS — R42 Dizziness and giddiness: Secondary | ICD-10-CM | POA: Diagnosis not present

## 2022-08-04 DIAGNOSIS — Z7901 Long term (current) use of anticoagulants: Secondary | ICD-10-CM | POA: Diagnosis not present

## 2022-08-04 LAB — CBC WITH DIFFERENTIAL/PLATELET
Abs Immature Granulocytes: 0.01 10*3/uL (ref 0.00–0.07)
Basophils Absolute: 0.1 10*3/uL (ref 0.0–0.1)
Basophils Relative: 1 %
Eosinophils Absolute: 0.2 10*3/uL (ref 0.0–0.5)
Eosinophils Relative: 3 %
HCT: 35.8 % — ABNORMAL LOW (ref 36.0–46.0)
Hemoglobin: 11.9 g/dL — ABNORMAL LOW (ref 12.0–15.0)
Immature Granulocytes: 0 %
Lymphocytes Relative: 27 %
Lymphs Abs: 1.8 10*3/uL (ref 0.7–4.0)
MCH: 31.2 pg (ref 26.0–34.0)
MCHC: 33.2 g/dL (ref 30.0–36.0)
MCV: 93.7 fL (ref 80.0–100.0)
Monocytes Absolute: 0.6 10*3/uL (ref 0.1–1.0)
Monocytes Relative: 8 %
Neutro Abs: 4.1 10*3/uL (ref 1.7–7.7)
Neutrophils Relative %: 61 %
Platelets: 259 10*3/uL (ref 150–400)
RBC: 3.82 MIL/uL — ABNORMAL LOW (ref 3.87–5.11)
RDW: 13.1 % (ref 11.5–15.5)
WBC: 6.6 10*3/uL (ref 4.0–10.5)
nRBC: 0 % (ref 0.0–0.2)

## 2022-08-04 LAB — TROPONIN I (HIGH SENSITIVITY): Troponin I (High Sensitivity): 7 ng/L (ref <18)

## 2022-08-04 LAB — BASIC METABOLIC PANEL
Anion gap: 9 (ref 5–15)
BUN: 22 mg/dL (ref 8–23)
CO2: 24 mmol/L (ref 22–32)
Calcium: 8.9 mg/dL (ref 8.9–10.3)
Chloride: 102 mmol/L (ref 98–111)
Creatinine, Ser: 0.74 mg/dL (ref 0.44–1.00)
GFR, Estimated: >60 mL/min (ref >60)
Glucose, Bld: 104 mg/dL — ABNORMAL HIGH (ref 70–99)
Potassium: 3.8 mmol/L (ref 3.5–5.1)
Sodium: 135 mmol/L (ref 135–145)

## 2022-08-04 NOTE — ED Triage Notes (Signed)
Pt states she woke up this am with mouth feeling dry and "full of cotton balls"  Pt states it is getting better

## 2022-08-04 NOTE — ED Provider Notes (Signed)
[place] [facility] Provider Note   CSN: [id number] Arrival date & time: 08/04/22  4010     History Afib Chief Complaint  Patient presents with   Dry Mouth    Cynthia [name] is a 82 y.o. female.  82 y.o female with a PMH of Afib on eliquis presents to the ED with a chief complaint of "mouth feels like its full of cotton balls". She reports waking up this morning and felt dry mouth, she has been working out more so recently and reports she might not have enough water intake. She did drink Pedialyte on her way to the emergency department and she felt this was improved. Records were reviewed and she did call cardiology to report feeling dizzy and lightheaded. She reports feeling lightheaded and unsteady on her feet for the past several months and this is not a new development. She denies any headache, chest pain, vision changes, or weakness.   The history is provided by the patient.       Home Medications Prior to Admission medications   Medication Sig Start Date End Date Taking? Authorizing Provider  acetaminophen (TYLENOL) 500 MG tablet Take 1,000 mg by mouth at bedtime.    [provider]  Alpha-Lipoic Acid (LIPOIC ACID PO) Take 600 mg by mouth daily.    [provider]  amiodarone (PACERONE) 200 MG tablet Take 1 tablet (200 mg total) by mouth daily. 09/10/21   Cantwell, Celeste C, PA-C  apixaban (ELIQUIS) 5 MG TABS tablet Take 1 tablet (5 mg total) by mouth 2 (two) times daily. 04/15/22   Yates Decamp, MD  atorvastatin (LIPITOR) 10 MG tablet TAKE 1 TABLET BY MOUTH EVERY DAY 03/10/21   Cantwell, Celeste C, PA-C  AZO-CRANBERRY PO Take 1 tablet by mouth daily.    [provider]  Calcium Carbonate-Vitamin D3 600-400 MG-UNIT TABS Take 1 tablet by mouth daily.    [provider]  cephALEXin (KEFLEX) 250 MG capsule Take 250 mg by mouth 4 (four) times daily.    [provider]  cycloSPORINE (RESTASIS) 0.05 %  ophthalmic emulsion Place 1 drop into both eyes daily.    [provider]  denosumab (PROLIA) 60 MG/ML SOSY injection Inject 60 mg into the skin every 6 (six) months.    [provider]  desmopressin (DDAVP) 0.2 MG tablet Take 3 tablets by mouth as needed.    [provider]  diltiazem (CARDIZEM CD) 120 MG 24 hr capsule Take 1 capsule (120 mg total) by mouth daily. 07/14/22   Yates Decamp, MD  fluticasone (FLONASE) 50 MCG/ACT nasal spray Place 1 spray into the nose at bedtime.    [provider]  losartan (COZAAR) 100 MG tablet Take 50 mg by mouth every evening. 04/15/20   [provider]  meclizine (ANTIVERT) 25 MG tablet Take 25 mg by mouth daily as needed for dizziness.    [provider]  Melatonin 5 MG CAPS Take 10 mg by mouth at bedtime.    [provider]  metoprolol succinate (TOPROL-XL) 25 MG 24 hr tablet Take 1 tablet (25 mg total) by mouth daily. 04/24/22   Yates Decamp, MD  Multiple Vitamin (MULTIVITAMIN WITH MINERALS) TABS tablet Take 1-2 tablets by mouth daily. flintstone vitamins Chewable with Iron    [provider]  multivitamin-iron-minerals-folic acid (CENTRUM) chewable tablet Chew 1 tablet by mouth daily.    [provider]  nitroGLYCERIN (NITROLINGUAL) 0.4 MG/SPRAY spray Place 1 spray under  the tongue every 5 (five) minutes x 3 doses as needed for chest pain. 03/19/21   Cantwell, Celeste C, PA-C  Omega-3 1000 MG CAPS Take 2,000 mg by mouth daily.     [provider]  ondansetron (ZOFRAN) 4 MG tablet Take 4 mg by mouth every 8 (eight) hours as needed for nausea or vomiting.    [provider]  pantoprazole (PROTONIX) 40 MG tablet Take 40 mg by mouth daily before breakfast.  06/12/14   [provider]  sertraline (ZOLOFT) 25 MG tablet Take 25 mg by mouth daily. Patient not taking: Reported on 07/23/2022    [provider]  thyroid (ARMOUR) 60 MG tablet Take 60 mg by mouth daily  before breakfast. 11/07/17   [provider]  traMADol (ULTRAM) 50 MG tablet Take 1 tablet (50 mg total) by mouth every 6 (six) hours as needed for severe pain. 06/08/21   Haus, Pearletha Furl, PA  traMADol (ULTRAM) 50 MG tablet Take 1 tablet (50 mg total) by mouth every 6 (six) hours as needed. 04/30/22   Arby Barrette, MD  vitamin C (ASCORBIC ACID) 500 MG tablet Take 500 mg by mouth daily.    [provider]      Allergies    Flecainide, Oxycodone, Trimethoprim, Alendronate sodium, Avelox [moxifloxacin hcl in nacl], Ciprofloxacin, and Other    Review of Systems   Review of Systems  Constitutional:  Negative for chills and fever.  Respiratory:  Negative for shortness of breath.   Cardiovascular:  Negative for chest pain.  Gastrointestinal:  Negative for abdominal pain, nausea and vomiting.  Genitourinary:  Negative for flank pain.  Neurological:  Positive for light-headedness. Negative for headaches.  All other systems reviewed and are negative.   Physical Exam Updated Vital Signs BP (!) 173/72 (BP Location: Left Arm)   Pulse 60   Temp 98.4 F (36.9 C) (Oral)   Resp 16   Ht  (1.651 m)   Wt 66.5 kg   SpO2 98%   BMI 24.40 kg/m  Physical Exam Vitals and nursing note reviewed.  Constitutional:      General: She is not in acute distress.    Appearance: She is well-developed.  HENT:     Head: Normocephalic and atraumatic.     Mouth/Throat:     Pharynx: No oropharyngeal exudate.  Eyes:     Pupils: Pupils are equal, round, and reactive to light.  Cardiovascular:     Rate and Rhythm: Regular rhythm.     Heart sounds: Normal heart sounds.  Pulmonary:     Effort: Pulmonary effort is normal. No respiratory distress.     Breath sounds: Normal breath sounds.  Abdominal:     General: Bowel sounds are normal. There is no distension.     Palpations: Abdomen is soft.     Tenderness: There is no abdominal tenderness.  Musculoskeletal:        General: No tenderness  or deformity.     Cervical back: Normal range of motion.     Right lower leg: No edema.     Left lower leg: No edema.  Skin:    General: Skin is warm and dry.  Neurological:     Mental Status: She is alert and oriented to person, place, and time.     Comments: Alert, oriented, thought content appropriate. Speech fluent without evidence of aphasia. Able to follow 2 step commands without difficulty.  Cranial Nerves:  II:  Peripheral visual fields grossly normal,  pupils, round, reactive to light III,IV, VI: ptosis not present, extra-ocular motions intact bilaterally  V,VII: smile symmetric, facial light touch sensation equal VIII: hearing grossly normal bilaterally  IX,X: midline uvula rise  XI: bilateral shoulder shrug equal and strong XII: midline tongue extension  Motor:  5/5 in upper and lower extremities bilaterally including strong and equal grip strength and dorsiflexion/plantar flexion Sensory: light touch normal in all extremities.  Cerebellar: normal finger-to-nose with bilateral upper extremities, pronator drift negative Gait: N/A       ED Results / Procedures / Treatments   Labs (all labs ordered are listed, but only abnormal results are displayed) Labs Reviewed  CBC WITH DIFFERENTIAL/PLATELET - Abnormal; Notable for the following components:      Result Value   RBC 3.82 (*)    Hemoglobin 11.9 (*)    HCT 35.8 (*)    All other components within normal limits  BASIC METABOLIC PANEL - Abnormal; Notable for the following components:   Glucose, Bld 104 (*)    All other components within normal limits  TROPONIN I (HIGH SENSITIVITY)  TROPONIN I (HIGH SENSITIVITY)    EKG EKG Interpretation  Date/Time:  Tuesday August 04 2022 10:57:07 EDT Ventricular Rate:  55 PR Interval:  182 QRS Duration: 112 QT Interval:  517 QTC Calculation: 495 R Axis:   -72 Text Interpretation: Sinus rhythm Left anterior fascicular block Confirmed by Lockie Mola, Adam ([id number]) on 08/04/2022  11:01:27 AM  Radiology No results found.  Procedures Procedures    Medications Ordered in ED Medications - No data to display  ED Course/ Medical Decision Making/ A&P                             Medical Decision Making Amount and/or Complexity of Data Reviewed Labs: ordered.    This patient presents to the ED for concern of dry mouth, this involves a number of treatment options, and is a complaint that carries with it a high risk of complications and morbidity.  The differential diagnosis includes    Co morbidities: Discussed in HPI   Brief History:  See HPI.   EMR reviewed including pt PMHx, past surgical history and past visits to ER.   See HPI for more details   Lab Tests:  I ordered and independently interpreted labs.  The pertinent results include:    I personally reviewed all laboratory work and imaging. Metabolic panel without any acute abnormality specifically kidney function within normal limits and no significant electrolyte abnormalities. CBC without leukocytosis or significant anemia.   Imaging Studies:  No imaging studies ordered for this patient   Cardiac Monitoring:  The patient was maintained on a cardiac monitor.  I personally viewed and interpreted the cardiac monitored which showed an underlying rhythm of: NSR EKG non-ischemic   Medicines ordered:  N/A  Reevaluation:  After the interventions noted above I re-evaluated patient and found that they have :stayed the same   Social Determinants of Health:  The patient's social determinants of health were a factor in the care of this patient   Problem List / ED Course:  Patient with underlying Afib anticoagulated on eliquis presents to the ED with a chief complaint of dry mouth ongoing since this am. Reports she took up working out recently and states that she feels that she might of not hydrated, she did take some Pedialyte, water prior to arrival in the ED and does report some  improvement in  her symptoms.  She also reports being lightheaded and unsteady on her gait, however this has been ongoing for several months.  Her neuroexam here is unremarkable, no facial asymmetry, no dysarthria, there was no neurological symptoms to spike TIA at this time.  Her vitals are within normal limits, she is not hypoxic, no tachycardia.  Her prior records were reviewed with a recent visit at the beginning of this month when she had a CT angio that did not show any signs of pulmonary embolism.  She does have a stable thoracic abdominal aortic aneurysm. Blood work on today's visit is unremarkable CBC with no leukocytosis, hemoglobin stable.  BMP without any electrolyte derangement, creatinine levels unremarkable, no signs of dehydration on her labs.  Troponin was obtained due to her ongoing lightheadedness which was negative.  She does not have any chest pain, no shortness of breath.  We discussed appropriate follow-up with her primary care physician continue hydration.  I do not suspect ACS at this time.  She is on some SSRIs, we discussed likely causing dry mouth, will need to continue to supplement with liquids. Patient and husband are agreeable of plan and treatment, stable for discharge.   Dispostion:  After consideration of the diagnostic results and the patients response to treatment, I feel that the patent would benefit from   Portions of this note were generated with Dragon dictation software. Dictation errors may occur despite best attempts at proofreading.   Final Clinical Impression(s) / ED Diagnoses Final diagnoses:  Lightheaded    Rx / DC Orders ED Discharge Orders     None         Claude Manges, PA-C 08/04/22 1226    [place], DO 08/04/22 1232

## 2022-08-04 NOTE — Discharge Instructions (Addendum)
Your laboratories also are within normal limits today.  Continue to hydrate with plenty of fluids if you continue to have a dry mouth, you are on medications that can trigger this.  Follow-up with your primary care physician as needed.

## 2022-08-04 NOTE — Telephone Encounter (Signed)
Patient is light headed, dizzy, mouth dry and puffy couldn't speak well. Patient is unsure if she is having a stroke. I advised to go to ED immediately.

## 2022-08-05 ENCOUNTER — Institutional Professional Consult (permissible substitution) (INDEPENDENT_AMBULATORY_CARE_PROVIDER_SITE_OTHER): Payer: Medicare PPO | Admitting: Surgical

## 2022-08-05 VITALS — BP 138/78 | HR 77 | Resp 20 | Ht 65.0 in | Wt 145.0 lb

## 2022-08-05 DIAGNOSIS — I7121 Aneurysm of the ascending aorta, without rupture: Secondary | ICD-10-CM

## 2022-08-05 NOTE — Patient Instructions (Signed)
Lifestyle modifications as discussed

## 2022-08-06 NOTE — Therapy (Signed)
OUTPATIENT PHYSICAL THERAPY TREATMENT NOTE  DISCHARGE     Patient Name: Cynthia [name] MRN: [id number] DOB:1940-08-13, 82 y.o., female Today's Date: 08/07/2022  PCP: Merri Brunette, MD REFERRING PROVIDER: Fatima Sanger, FNP   END OF SESSION:   PT End of Session - 08/07/22 1115     Visit Number 12    Number of Visits 17    Date for PT Re-Evaluation 08/10/22    Authorization Type [employer] MCR    Authorization Time Period 06/25/2022 - 08/08/2022    Authorization - Visit Number 11    Authorization - Number of Visits 12    PT Start Time 1110    PT Stop Time 1145    PT Time Calculation (min) 35 min    Activity Tolerance Patient tolerated treatment well    Behavior During Therapy [facility] for tasks assessed/performed                   Past Medical History:  Diagnosis Date   Acute GI bleeding 10/27/2017   Arthritis    "joints" (11/10/2017)   Dizziness    Dysrhythmia    afib   Encounter for loop recorder check 06/09/2020   GERD (gastroesophageal reflux disease)    GI bleed 11/06/2017   Hearing loss    History of blood transfusion 10/2017   Hyperlipidemia    Hypertension    Hyperthyroidism    Loop recorder Biotronik Loop Recorder 05/23/2020 05/23/2020   Melanoma (HCC)    "cut off my back"   Memory loss    Pneumonia    Sleep apnea    cpap   Syncope and collapse    Past Surgical History:  Procedure Laterality Date   ABDOMINAL HYSTERECTOMY     "partial"   APPENDECTOMY     BIOPSY  11/17/2017   Procedure: BIOPSY;  Surgeon: Vida Rigger, MD;  Location: [facility];  Service: Endoscopy;;   COLONOSCOPY WITH PROPOFOL N/A 11/17/2017   Procedure: COLONOSCOPY WITH PROPOFOL;  Surgeon: Vida Rigger, MD;  Location: [facility];  Service: Endoscopy;  Laterality: N/A;   CRANIECTOMY FOR EXCISION OF ACOUSTIC NEUROMA     ESOPHAGOGASTRODUODENOSCOPY (EGD) WITH PROPOFOL N/A 10/28/2017   Procedure: ESOPHAGOGASTRODUODENOSCOPY (EGD) WITH PROPOFOL;  Surgeon: Vida Rigger, MD;   Location: [facility]OS[facility];  Service: Endoscopy;  Laterality: N/A;   FOOT SURGERY Bilateral 04/18/2020   KNEE ARTHROPLASTY Right 06/05/2021   Procedure: COMPUTER ASSISTED TOTAL KNEE ARTHROPLASTY;  Surgeon: Samson Frederic, MD;  Location: [facility] ORS;  Service: Orthopedics;  Laterality: Right;   KNEE ARTHROSCOPY Left    LEFT HEART CATH AND CORONARY ANGIOGRAPHY N/A 11/24/2016   Procedure: LEFT HEART CATH AND CORONARY ANGIOGRAPHY;  Surgeon: Corky Crafts, MD;  Location: [facility] INVASIVE CV LAB;  Service: Cardiovascular;  Laterality: N/A;   MELANOMA EXCISION     "off my back"   NASAL SINUS SURGERY     TONSILLECTOMY     Patient Active Problem List   Diagnosis Date Noted   Precordial pain 07/19/2022   Coronary artery calcification seen on computed tomography 07/19/2022   Aneurysm of ascending aorta without rupture (HCC) 07/19/2022   Degenerative arthritis of right knee 06/05/2021   Osteoarthritis of right knee 06/05/2021   NPH (normal pressure hydrocephalus) (HCC) 08/19/2020   Urinary urgency 08/19/2020   Encounter for loop recorder check 06/09/2020   Loop recorder Biotronik Loop Recorder 05/23/2020 05/23/2020   Paroxysmal atrial fibrillation (HCC) 03/24/2020   Gait disturbance 01/29/2020   Periodic limb movement disorder 08/30/2019   Nocturia  07/01/2018   Midline cystocele 06/08/2018   Recurrent UTI 06/08/2018   Vaginal vault prolapse 06/08/2018   Syncope and collapse    Postural dizziness with presyncope 03/17/2018   PAF (paroxysmal atrial fibrillation) (HCC) 03/17/2018   Memory loss 11/25/2017   Abnormal stress test    Hyperlipidemia 11/23/2016   GERD (gastroesophageal reflux disease) 11/23/2016   Polyneuropathy 10/30/2016   Bursitis, subacromial 10/30/2016   Numbness 10/30/2016   Arthritis, senescent 01/03/2016   Pain in joint, ankle and foot 02/28/2015   Dry mouth 08/07/2014   Dry eyes 08/07/2014   Other headache syndrome 06/25/2014   Sinusitis, chronic 06/25/2014   Neck pain  06/25/2014   Obstructive sleep apnea 06/25/2014   Essential hypertension, benign 06/25/2014    REFERRING DIAG: Poor balance, Unsteady gait   THERAPY DIAG:  Repeated falls  Muscle weakness (generalized)  Other abnormalities of gait and mobility  Rationale for Evaluation and Treatment Rehabilitation  PERTINENT HISTORY: See PMH above   PRECAUTIONS: Fall    SUBJECTIVE:                                                                                                                                                                                     SUBJECTIVE STATEMENT: Patient reports she is doing about the same. No falls. She doesn't do her exercises like she should.  PAIN:  Are you having pain? Yes:  NPRS scale: 4 /10 Pain location: Low back Pain description: Burning Aggravating factors: Sweeping, standing, walking Relieving factors: Rest   OBJECTIVE: (objective measures completed at initial evaluation unless otherwise dated) PATIENT SURVEYS:  FOTO 36% functional status 07/08/22: 35%  07/31/22: 46%   POSTURE:             Rounded shoulders, generally kyphotic   LOWER EXTREMITY MMT:   MMT Right eval Left eval Rt / Lt 06/25/2022 Rt / Lt 07/13/2022  Hip flexion 4- 4-    Hip extension 2 2  3  / 3  Hip abduction 3 3  3  / 3  Hip adduction        Hip internal rotation        Hip external rotation        Knee flexion 4- 4 4- / 4   Knee extension 4- 4 4- / 4   Ankle dorsiflexion 4 4    Ankle plantarflexion 2 2    Ankle inversion        Ankle eversion         (Blank rows = not tested)   FUNCTIONAL TESTS:  5 times sit to stand: 22 seconds  07/06/2022: 19 seconds  07/31/22: 22 seconds 08/07/2022: 16 seconds Timed  up and go (TUG): 41 seconds with RW  07/27/2022: 32 seconds with RW  07/31/22: 42.74  08/03/22: 31.5 sec 2 minute walk test: 113 ft with RW  06/29/2022: 168 ft  07/31/22: 155 feet   08/03/22: 171 feet Berg Balance Scale: 34/56 (see eval for results) 08/03/2022:  BERG Balance Scale 45/56   GAIT: Distance walked: 113 ft Assistive device utilized: Walker - 2 wheeled Level of assistance: Modified independence Comments: Decreased gait speed, bilateral knee flexion throughout gait,      TODAY'S TREATMENT:    [facility] Adult PT Treatment:                                                DATE: 08/06/2022 Therapeutic Exercise: NuStep L6 x 5 min with UE/LE while taking subjective Sit to stand x 10, with overhead reach holding yellow med ball 2 x 10 LAQ with 5# 2 x 20 each Standing hip abduction with 5# 2 x 10 each Standing alternating march with 5# 2 x 20 Seated clamshell with red 2 x 20 Romberg stance with trunk rotation holding yellow med ball 2 x 10   [facility] Adult PT Treatment:                                                DATE: 08/03/22 Therapeutic Exercise: Nustep L5 x 5 minutes   Therapeutic Activity:  5 x STS 29 s 2 MWT 171 feet BERG BALANCE TEST Sitting to Standing: 4.      Stands without using hands and stabilize independently Standing Unsupported: 4.      Stands safely for 2 minutes Sitting Unsupported: 4.     Sits for 2 minutes independently Standing to Sitting: 4.     Sits safely with minimal use of hands Transfers: 4.     Transfers safely with minor use of hands Standing with eyes closed: 4.     Stands safely for 10 seconds  Standing with feet together: 3.     Stands for 1 minute with supervision Reaching forward with outstretched arm: 3.     Reaches forward 5 inches Retrieving object from the floor: 4.      Able to pick up easily and safely Turning to look behind: 4.     Looks behind from both sides and weight shifts well Turning 360 degrees: 2.     Able to turn slowly, but safely Place alternate foot on stool: 2.     4 steps without assistance/supervision Standing with one foot in front: 2.     Independent small step for 30 seconds Standing on one foot: 1.     Holds <3 seconds Total Score: 45/56   [facility] Adult PT Treatment:                                                 DATE: 07/28/22 Therapeutic Exercise: Nustep L5 UE/LE x 5 minutes  LAQ 5#  to fatigue March 5#  to fatigye Therapeutic Activity: TUG 5 x STS  [facility] Adult PT Treatment:  DATE: 07/27/2022 Therapeutic Exercise: NuStep L6 x 5 min with UE/LE while taking subjective Sit to stand x 10, with overhead reach holding yellow med ball 2 x 10 LAQ with 5# 2 x 20 each Standing hip abduction with 5# 2 x 10 each Standing heel raises with 5# 2 x 10 Standing alternating march 2 x 20 Romberg stance with trunk rotation holding yellow med ball 2 x 10   PATIENT EDUCATION:  Education details: POC discharge, HEP Person educated: Patient Education method: Programmer, multimedia, Demonstration Education comprehension: Verbalized understanding, returned demonstration   HOME EXERCISE PROGRAM: Access Code: [email or web address]      ASSESSMENT: CLINICAL IMPRESSION: Patient tolerated therapy well with no adverse effects. She has made progress with therapy and demonstrates overall improved mobility and reports improvement in her functional ability, achieving her FOTO goal. She has not been consistent with her HEP but does attend a weekly exercise class, so patient will be discharged from PT and was encouraged to continue with exercise to maintain her current functional ability.     OBJECTIVE IMPAIRMENTS: Abnormal gait, decreased activity tolerance, decreased balance, decreased endurance, difficulty walking, decreased strength, improper body mechanics, postural dysfunction, and pain.    ACTIVITY LIMITATIONS: carrying, lifting, bending, standing, squatting, stairs, transfers, dressing, hygiene/grooming, and locomotion level   PARTICIPATION LIMITATIONS: meal prep, cleaning, laundry, shopping, and community activity   PERSONAL FACTORS: Age, Fitness, Past/current experiences, and Time since onset of injury/illness/exacerbation are also affecting patient's  functional outcome.      GOALS: Goals reviewed with patient? Yes   SHORT TERM GOALS: Target date: 07/13/2022   Patient will be I with initial HEP in order to progress with therapy. Baseline: HEP provided at eval 07/13/2022: progressing 07/1922: Doing some, not as much as I should Goal status: NOT MET   2.  PT will review FOTO with patient by 3rd visit in order to understand expected progress and outcome with therapy. Baseline: FOTO assessed at eval 07/01/2022: reviewed Goal status: MET   LONG TERM GOALS: Target date: 08/10/2022   Patient will be I with final HEP to maintain progress from PT. Baseline: HEP provided at eval 08/07/2022: reports not doing it like she should Goal status: NOT MET   2.  Patient will report >/= 46% status on FOTO to indicate improved functional ability. Baseline: 36% functional status 07/31/22: 46% Goal status: MET   3.  Patient will demonstrate BERG >/= 42/56 to indicate improved balance and reduced fall risk Baseline: 34/56 08/03/22: 45/56 Goal status: MET   4.  Patient will demonstrate 5xSTS </= 15 seconds to indicate improved LE strength and reduced fall risk Baseline: 22 seconds 07/06/2022: 19 seconds 4/19/4: 22 sec  08/07/2022: 16 seconds Goal status: PARTIALLY MET   5.  Patient will perform >/= 185 ft using LRAD in order to improve household mobility Baseline: 113 ft using RW 07/31/22: 155 feet  08/03/22: 171 feet Goal status: PARTIALLY MET   6.  Patient will perform TUG </= 20 seconds using LRAD to indicate improved mobility and reduced fall risk Baseline: 41 seconds using RW 07/27/2022: 32 seconds with RW 07/31/22: 42.74 sec with RW 08/03/22: 31.5 sec with RW  Goal status: PARTIALLY MET     PLAN: PT FREQUENCY: 1-2x/week   PT DURATION: 8 weeks   PLANNED INTERVENTIONS: Therapeutic exercises, Therapeutic activity, Neuromuscular re-education, Balance training, Gait training, Patient/Family education, Self Care, Joint mobilization,  Aquatic Therapy, Cryotherapy, Moist heat, Manual therapy, and Re-evaluation   PLAN FOR NEXT SESSION: NA - discharge  Rosana Hoes, PT, DPT, LAT, ATC 08/07/22  11:45 AM Phone: [phone] Fax: [phone]    PHYSICAL THERAPY DISCHARGE SUMMARY  Visits from Start of Care: 11  Current functional level related to goals / functional outcomes: See above   Remaining deficits: See above   Education / Equipment: HEP   Patient agrees to discharge. Patient goals were partially met. Patient is being discharged due to maximized rehab potential.   Rosana Hoes, PT, DPT, LAT, ATC 08/07/22  11:45 AM Phone: [phone] Fax: [phone]

## 2022-08-07 ENCOUNTER — Other Ambulatory Visit: Payer: Self-pay

## 2022-08-07 ENCOUNTER — Ambulatory Visit: Payer: Medicare PPO | Admitting: Physical Therapy

## 2022-08-07 ENCOUNTER — Encounter: Payer: Self-pay | Admitting: Physical Therapy

## 2022-08-07 DIAGNOSIS — R296 Repeated falls: Secondary | ICD-10-CM

## 2022-08-07 DIAGNOSIS — R2689 Other abnormalities of gait and mobility: Secondary | ICD-10-CM

## 2022-08-07 DIAGNOSIS — M6281 Muscle weakness (generalized): Secondary | ICD-10-CM

## 2022-08-10 ENCOUNTER — Other Ambulatory Visit: Payer: Self-pay | Admitting: Cardiology

## 2022-08-11 ENCOUNTER — Other Ambulatory Visit: Payer: Self-pay

## 2022-08-12 ENCOUNTER — Other Ambulatory Visit: Payer: Self-pay

## 2022-08-12 DIAGNOSIS — E782 Mixed hyperlipidemia: Secondary | ICD-10-CM

## 2022-08-12 MED ORDER — ATORVASTATIN CALCIUM 10 MG PO TABS
10.0000 mg | ORAL_TABLET | Freq: Every day | ORAL | 3 refills | Status: DC
Start: 2022-08-12 — End: 2022-08-13

## 2022-08-13 ENCOUNTER — Other Ambulatory Visit: Payer: Self-pay

## 2022-08-13 DIAGNOSIS — E782 Mixed hyperlipidemia: Secondary | ICD-10-CM

## 2022-08-13 MED ORDER — ATORVASTATIN CALCIUM 10 MG PO TABS
10.0000 mg | ORAL_TABLET | Freq: Every day | ORAL | 3 refills | Status: DC
Start: 2022-08-13 — End: 2023-06-04

## 2022-08-19 ENCOUNTER — Other Ambulatory Visit: Payer: Self-pay

## 2022-08-19 ENCOUNTER — Telehealth: Payer: Self-pay

## 2022-08-19 DIAGNOSIS — R55 Syncope and collapse: Secondary | ICD-10-CM

## 2022-08-19 NOTE — Telephone Encounter (Signed)
Patient called back and stated that she has a history of heat strokes and just yesterday she was walking across the parking lot and could not return to the car, and her husband had to come and pick her up. Patient did have complaints of dizziness, fatigue and near syncope, but denies chest pain and nausea.Patient wants to know if she needs to come in for an appointment, or can we just put a monitor on her for 1 week or 2. Please advise.   MA's: Please call husband, as patient cannot find her phone. Thank you.

## 2022-08-19 NOTE — Telephone Encounter (Signed)
Patient is aware, she will be in Thursday morning. Can you please put her on the schedule for monitor?

## 2022-08-20 ENCOUNTER — Other Ambulatory Visit: Payer: Medicare PPO

## 2022-08-20 DIAGNOSIS — R55 Syncope and collapse: Secondary | ICD-10-CM | POA: Diagnosis not present

## 2022-08-22 DIAGNOSIS — R55 Syncope and collapse: Secondary | ICD-10-CM | POA: Diagnosis not present

## 2022-08-22 DIAGNOSIS — Z95818 Presence of other cardiac implants and grafts: Secondary | ICD-10-CM | POA: Diagnosis not present

## 2022-08-22 DIAGNOSIS — Z4509 Encounter for adjustment and management of other cardiac device: Secondary | ICD-10-CM | POA: Diagnosis not present

## 2022-08-25 DIAGNOSIS — R918 Other nonspecific abnormal finding of lung field: Secondary | ICD-10-CM | POA: Diagnosis not present

## 2022-08-25 DIAGNOSIS — R2681 Unsteadiness on feet: Secondary | ICD-10-CM | POA: Diagnosis not present

## 2022-08-25 DIAGNOSIS — I7121 Aneurysm of the ascending aorta, without rupture: Secondary | ICD-10-CM | POA: Diagnosis not present

## 2022-09-02 ENCOUNTER — Ambulatory Visit: Payer: Medicare PPO

## 2022-09-02 ENCOUNTER — Ambulatory Visit: Payer: Medicare PPO | Admitting: Internal Medicine

## 2022-09-08 ENCOUNTER — Encounter (HOSPITAL_COMMUNITY): Payer: Self-pay

## 2022-09-08 ENCOUNTER — Other Ambulatory Visit: Payer: Self-pay

## 2022-09-08 ENCOUNTER — Emergency Department (HOSPITAL_COMMUNITY): Payer: Medicare PPO

## 2022-09-08 ENCOUNTER — Emergency Department (HOSPITAL_COMMUNITY)
Admission: EM | Admit: 2022-09-08 | Discharge: 2022-09-08 | Disposition: A | Discharge status: Home / Self Care | Payer: Medicare PPO | Attending: Emergency Medicine | Admitting: Emergency Medicine

## 2022-09-08 DIAGNOSIS — R4781 Slurred speech: Secondary | ICD-10-CM | POA: Insufficient documentation

## 2022-09-08 DIAGNOSIS — R29818 Other symptoms and signs involving the nervous system: Secondary | ICD-10-CM | POA: Diagnosis not present

## 2022-09-08 DIAGNOSIS — Z7901 Long term (current) use of anticoagulants: Secondary | ICD-10-CM | POA: Insufficient documentation

## 2022-09-08 DIAGNOSIS — R42 Dizziness and giddiness: Secondary | ICD-10-CM | POA: Diagnosis not present

## 2022-09-08 DIAGNOSIS — N3000 Acute cystitis without hematuria: Secondary | ICD-10-CM | POA: Insufficient documentation

## 2022-09-08 DIAGNOSIS — Z79899 Other long term (current) drug therapy: Secondary | ICD-10-CM | POA: Insufficient documentation

## 2022-09-08 DIAGNOSIS — D649 Anemia, unspecified: Secondary | ICD-10-CM | POA: Insufficient documentation

## 2022-09-08 DIAGNOSIS — G4489 Other headache syndrome: Secondary | ICD-10-CM | POA: Diagnosis not present

## 2022-09-08 LAB — URINALYSIS, ROUTINE W REFLEX MICROSCOPIC
Bacteria, UA: NONE SEEN
Bilirubin Urine: NEGATIVE
Glucose, UA: NEGATIVE mg/dL
Hgb urine dipstick: NEGATIVE
Ketones, ur: NEGATIVE mg/dL
Nitrite: POSITIVE — AB
Protein, ur: NEGATIVE mg/dL
Specific Gravity, Urine: 1.017 (ref 1.005–1.030)
pH: 6 (ref 5.0–8.0)

## 2022-09-08 LAB — CBC
HCT: 34.3 % — ABNORMAL LOW (ref 36.0–46.0)
Hemoglobin: 10.7 g/dL — ABNORMAL LOW (ref 12.0–15.0)
MCH: 31.1 pg (ref 26.0–34.0)
MCHC: 31.2 g/dL (ref 30.0–36.0)
MCV: 99.7 fL (ref 80.0–100.0)
Platelets: 202 10*3/uL (ref 150–400)
RBC: 3.44 MIL/uL — ABNORMAL LOW (ref 3.87–5.11)
RDW: 13 % (ref 11.5–15.5)
WBC: 6.1 10*3/uL (ref 4.0–10.5)
nRBC: 0 % (ref 0.0–0.2)

## 2022-09-08 LAB — COMPREHENSIVE METABOLIC PANEL
ALT: 17 U/L (ref 0–44)
AST: 22 U/L (ref 15–41)
Albumin: 3.6 g/dL (ref 3.5–5.0)
Alkaline Phosphatase: 40 U/L (ref 38–126)
Anion gap: 8 (ref 5–15)
BUN: 15 mg/dL (ref 8–23)
CO2: 25 mmol/L (ref 22–32)
Calcium: 9.4 mg/dL (ref 8.9–10.3)
Chloride: 105 mmol/L (ref 98–111)
Creatinine, Ser: 0.93 mg/dL (ref 0.44–1.00)
GFR, Estimated: >60 mL/min (ref >60)
Glucose, Bld: 97 mg/dL (ref 70–99)
Potassium: 3.5 mmol/L (ref 3.5–5.1)
Sodium: 138 mmol/L (ref 135–145)
Total Bilirubin: 0.6 mg/dL (ref 0.3–1.2)
Total Protein: 6.3 g/dL — ABNORMAL LOW (ref 6.5–8.1)

## 2022-09-08 LAB — DIFFERENTIAL
Abs Immature Granulocytes: 0.02 10*3/uL (ref 0.00–0.07)
Basophils Absolute: 0.1 10*3/uL (ref 0.0–0.1)
Basophils Relative: 1 %
Eosinophils Absolute: 0.1 10*3/uL (ref 0.0–0.5)
Eosinophils Relative: 2 %
Immature Granulocytes: 0 %
Lymphocytes Relative: 24 %
Lymphs Abs: 1.5 10*3/uL (ref 0.7–4.0)
Monocytes Absolute: 0.6 10*3/uL (ref 0.1–1.0)
Monocytes Relative: 10 %
Neutro Abs: 3.8 10*3/uL (ref 1.7–7.7)
Neutrophils Relative %: 63 %

## 2022-09-08 LAB — RAPID URINE DRUG SCREEN, HOSP PERFORMED
Amphetamines: NOT DETECTED
Barbiturates: NOT DETECTED
Benzodiazepines: NOT DETECTED
Cocaine: NOT DETECTED
Opiates: NOT DETECTED
Tetrahydrocannabinol: NOT DETECTED

## 2022-09-08 LAB — I-STAT CHEM 8, ED
BUN: 20 mg/dL (ref 8–23)
Calcium, Ion: 1.19 mmol/L (ref 1.15–1.40)
Chloride: 105 mmol/L (ref 98–111)
Creatinine, Ser: 0.9 mg/dL (ref 0.44–1.00)
Glucose, Bld: 85 mg/dL (ref 70–99)
HCT: 34 % — ABNORMAL LOW (ref 36.0–46.0)
Hemoglobin: 11.6 g/dL — ABNORMAL LOW (ref 12.0–15.0)
Potassium: 3.8 mmol/L (ref 3.5–5.1)
Sodium: 141 mmol/L (ref 135–145)
TCO2: 27 mmol/L (ref 22–32)

## 2022-09-08 LAB — PROTIME-INR
INR: 1.2 (ref 0.8–1.2)
Prothrombin Time: 15.4 seconds — ABNORMAL HIGH (ref 11.4–15.2)

## 2022-09-08 LAB — ETHANOL: Alcohol, Ethyl (B): <10 mg/dL (ref <10)

## 2022-09-08 LAB — APTT: aPTT: 30 seconds (ref 24–36)

## 2022-09-08 NOTE — ED Triage Notes (Addendum)
Pt arrives via EMS from home. Pt reports dizziness and headache x days. States she vomited yesterday. Pt does take eliquis. Pt is AxOx4. Pt reports that her husband informed her that her speech was slurred this morning. Pt was also started on abx for a uti.

## 2022-09-08 NOTE — ED Provider Notes (Signed)
[place] EMERGENCY DEPARTMENT AT [facility] Provider Note   CSN: [id number] Arrival date & time: 09/08/22  1041     History {Add pertinent medical, surgical, social history, OB history to HPI:1} Chief Complaint  Patient presents with   Dizziness   Headache    Cynthia [name] is a 82 y.o. female who presents to ED concerned for slurred speech since this morning. Patient also endorsing dizziness x2 days. Patient stating that she has UTI and has been taking Cephalexin x2 days. Patient without slurred speech here in ED.  Denies fever, abdominal pain, chest pain, dyspnea   Dizziness Associated symptoms: headaches   Headache Associated symptoms: dizziness        Home Medications Prior to Admission medications   Medication Sig Start Date End Date Taking? Authorizing Provider  acetaminophen (TYLENOL) 500 MG tablet Take 1,000 mg by mouth at bedtime.   Yes [provider]  Alpha-Lipoic Acid (LIPOIC ACID PO) Take 600 mg by mouth daily.   Yes [provider]  amiodarone (PACERONE) 200 MG tablet Take 1 tablet (200 mg total) by mouth daily. 09/10/21  Yes Cantwell, Celeste C, PA-C  apixaban (ELIQUIS) 5 MG TABS tablet Take 1 tablet (5 mg total) by mouth 2 (two) times daily. 04/15/22  Yes Yates Decamp, MD  atorvastatin (LIPITOR) 10 MG tablet Take 1 tablet (10 mg total) by mouth daily. 08/13/22  Yes Custovic, Sabina, DO  AZO-CRANBERRY PO Take 1 tablet by mouth daily.   Yes [provider]  Calcium Carbonate-Vitamin D3 600-400 MG-UNIT TABS Take 1 tablet by mouth daily.   Yes [provider]  cephALEXin (KEFLEX) 250 MG capsule Take 250 mg by mouth 4 (four) times daily.   Yes [provider]  denosumab (PROLIA) 60 MG/ML SOSY injection Inject 60 mg into the skin every 6 (six) months.   Yes [provider]  desmopressin (DDAVP) 0.2 MG tablet Take 3 tablets by mouth as needed (Uriniary tract).   Yes [provider]  diltiazem  (CARDIZEM CD) 120 MG 24 hr capsule Take 1 capsule (120 mg total) by mouth daily. 07/14/22  Yes Yates Decamp, MD  fluticasone (FLONASE) 50 MCG/ACT nasal spray Place 1 spray into the nose at bedtime.   Yes [provider]  losartan (COZAAR) 100 MG tablet Take 50 mg by mouth every evening. 04/15/20  Yes [provider]  meclizine (ANTIVERT) 25 MG tablet Take 25 mg by mouth daily as needed for dizziness.   Yes [provider]  Melatonin 5 MG CAPS Take 10 mg by mouth at bedtime.   Yes [provider]  metoprolol succinate (TOPROL-XL) 25 MG 24 hr tablet Take 1 tablet (25 mg total) by mouth daily. 04/24/22  Yes Yates Decamp, MD  Multiple Vitamin (MULTIVITAMIN WITH MINERALS) TABS tablet Take 1-2 tablets by mouth daily. flintstone vitamins Chewable with Iron   Yes [provider]  multivitamin-iron-minerals-folic acid (CENTRUM) chewable tablet Chew 1 tablet by mouth daily.   Yes [provider]  nitroGLYCERIN (NITROLINGUAL) 0.4 MG/SPRAY spray Place 1 spray under the tongue every 5 (five) minutes x 3 doses as needed for chest pain. 03/19/21  Yes Cantwell, Celeste C, PA-C  Omega-3 1000 MG CAPS Take 2,000 mg by mouth daily.    Yes [provider]  ondansetron (ZOFRAN) 4 MG tablet Take 4 mg by mouth every 8 (eight) hours as needed for nausea or vomiting.   Yes [provider]  pantoprazole (PROTONIX) 40 MG tablet Take 40  mg by mouth daily before breakfast.  06/12/14  Yes [provider]  Pediatric Multiple Vitamins (FLINTSTONES MULTIVITAMIN) CHEW Chew 1 tablet by mouth daily.   Yes [provider]  Pediatric Multivit-Minerals (FLINTSTONES GUMMIES COMPLETE PO) Take by mouth.   Yes [provider]  thyroid (ARMOUR) 60 MG tablet Take 60 mg by mouth daily before breakfast. 11/07/17  Yes [provider]  vitamin C (ASCORBIC ACID) 500 MG tablet Take 500 mg by mouth daily.   Yes [provider]      Allergies     Flecainide, Oxycodone, Trimethoprim, Alendronate sodium, Avelox [moxifloxacin hcl in nacl], Ciprofloxacin, and Other    Review of Systems   Review of Systems  Neurological:  Positive for dizziness and headaches.    Physical Exam Updated Vital Signs BP (!) 148/80   Pulse 64   Temp 98.3 F (36.8 C) (Oral)   Resp 12   Ht 5\' 5"  (1.651 m)   Wt 70.3 kg   SpO2 93%   BMI 25.79 kg/m  Physical Exam Vitals and nursing note reviewed.  Constitutional:      General: She is not in acute distress.    Appearance: She is not ill-appearing or toxic-appearing.  HENT:     Head: Normocephalic and atraumatic.     Mouth/Throat:     Mouth: Mucous membranes are moist.     Pharynx: Oropharynx is clear. No oropharyngeal exudate or posterior oropharyngeal erythema.  Eyes:     General: No scleral icterus.       Right eye: No discharge.        Left eye: No discharge.     Extraocular Movements: Extraocular movements intact.     Conjunctiva/sclera: Conjunctivae normal.     Pupils: Pupils are equal, round, and reactive to light.  Cardiovascular:     Rate and Rhythm: Normal rate.     Pulses: Normal pulses.     Heart sounds: No murmur heard. Pulmonary:     Effort: Pulmonary effort is normal. No respiratory distress.     Breath sounds: No wheezing, rhonchi or rales.  Abdominal:     General: Bowel sounds are normal. There is no distension.     Palpations: Abdomen is soft. There is no mass.     Tenderness: There is no abdominal tenderness.  Musculoskeletal:     Right lower leg: No edema.     Left lower leg: No edema.  Skin:    General: Skin is warm and dry.     Findings: No rash.  Neurological:     General: No focal deficit present.     Mental Status: She is alert. Mental status is at baseline.     GCS: GCS eye subscore is 4. GCS verbal subscore is 5. GCS motor subscore is 6.     Cranial Nerves: No cranial nerve deficit.     Motor: No weakness.     Comments: GCS 15. Speech is goal oriented. No  deficits appreciated to CN III-XII; symmetric eyebrow raise, no facial drooping, tongue midline. Patient has equal grip strength bilaterally with 5/5 strength against resistance in all major muscle groups bilaterally. Sensation to light touch intact. Patient moves extremities without ataxia.    Psychiatric:        Mood and Affect: Mood normal.        Behavior: Behavior normal.     ED Results / Procedures / Treatments   Labs (all labs ordered are listed, but only abnormal results are displayed)  Labs Reviewed  PROTIME-INR - Abnormal; Notable for the following components:      Result Value   Prothrombin Time 15.4 (*)    All other components within normal limits  CBC - Abnormal; Notable for the following components:   RBC 3.44 (*)    Hemoglobin 10.7 (*)    HCT 34.3 (*)    All other components within normal limits  COMPREHENSIVE METABOLIC PANEL - Abnormal; Notable for the following components:   Total Protein 6.3 (*)    All other components within normal limits  URINALYSIS, ROUTINE W REFLEX MICROSCOPIC - Abnormal; Notable for the following components:   Color, Urine AMBER (*)    Nitrite POSITIVE (*)    Leukocytes,Ua TRACE (*)    All other components within normal limits  I-STAT CHEM 8, ED - Abnormal; Notable for the following components:   Hemoglobin 11.6 (*)    HCT 34.0 (*)    All other components within normal limits  ETHANOL  APTT  DIFFERENTIAL  RAPID URINE DRUG SCREEN, HOSP PERFORMED    EKG None  Radiology CT HEAD WO CONTRAST  Result Date: 09/08/2022 CLINICAL DATA:  Neuro deficit, acute, stroke suspected. EXAM: CT HEAD WITHOUT CONTRAST TECHNIQUE: Contiguous axial images were obtained from the base of the skull through the vertex without intravenous contrast. RADIATION DOSE REDUCTION: This exam was performed according to the departmental dose-optimization program which includes automated exposure control, adjustment of the mA and/or kV according to patient size and/or use  of iterative reconstruction technique. COMPARISON:  Head CT 06/08/2022.  MRI brain 12/15/2018. FINDINGS: Brain: No acute hemorrhage. Unchanged mild chronic small-vessel disease. Cortical gray-white differentiation is otherwise preserved. Unchanged ventricular prominence disproportionate to the sulci with acute callosal angle and elevated Evans index. No hydrocephalus or extra-axial collection. No mass effect or midline shift. Vascular: No hyperdense vessel or unexpected calcification. Skull: Prior left retrosigmoid craniotomy. No calvarial fracture or suspicious bone lesion. Skull base is unremarkable. Sinuses/Orbits: Chronic left maxillary and right sphenoid sinusitis. Orbits are unremarkable. Mastoids are well aerated. Other: None. IMPRESSION: 1. No acute intracranial abnormality. 2. Unchanged ventricular prominence disproportionate to the sulci with acute callosal angle and elevated Evans index. This can be seen with idiopathic normal pressure hydrocephalus in the appropriate clinical setting. Electronically Signed   By: Orvan Falconer M.D.   On: 09/08/2022 11:50    Procedures Procedures  {Document cardiac monitor, telemetry assessment procedure when appropriate:1}  Medications Ordered in ED Medications - No data to display  ED Course/ Medical Decision Making/ A&P   {   Click here for ABCD2, HEART and other calculatorsREFRESH Note before signing :1}                          Medical Decision Making Amount and/or Complexity of Data Reviewed Labs: ordered. Radiology: ordered.   This patient presents to the ED for concern of AMS, this involves an extensive number of treatment options, and is a complaint that carries with it a high risk of complications and morbidity.  The differential diagnosis includes CVA, ICH, intracranial mass, critical dehydration, heptatic dysfunction, uremia, hypercarbia, intoxication/withdrawal, endocrine abnormality, sepsis/infection.   Co morbidities that complicate  the patient evaluation  none    Lab Tests:  I Ordered, and personally interpreted labs.  The pertinent results include: CBC: no leukocytosis; mild anemia CMP: no concern for electrolyte abnormality; no concern for kidney/liver damage UA: positive nitrite; trace leukocytes UDS: negative ETOH: within normal limits APTT: within  normal limits PT-INR: normal limits     Imaging Studies ordered:  I ordered imaging studies including  -CT head: 1. No acute intracranial abnormality. 2. Unchanged ventricular prominence disproportionate to the sulci with acute callosal angle and elevated Evans index. This can be seen with idiopathic normal pressure hydrocephalus in the appropriate clinical setting. I independently visualized and interpreted imaging  I agree with the radiologist interpretation   Cardiac Monitoring: / EKG:  The patient was maintained on a cardiac monitor.  I personally viewed and interpreted the cardiac monitored which showed an underlying rhythm of: sinus rhythm without acute ST changes or arrhythmias    Problem List / ED Course / Critical interventions / Medication management  Patient presented for slurred speech. Patient without slurred speech in ED. Neuro exam unremarkable. Physical exam unremarkable. Labs and EKG reassuring. CT head without concern of stroke. I am concerned that patient's symptoms of dizziness and possible slurred speech could be from her UTI causing AMS. Patient endorses taking ABX x2 days which I do not think is long enough for symptom resolution. I educated patient on the importance of finishing entire course of ABX. I have reviewed the patients home medicines and have made adjustments as needed Patient was given return precautions. Patient stable for discharge at this time. Patient verbalized understanding of plan.   DDx: These were considered less likely due to history of present illness and physical exam findings CVA/ICH/intracranial mass:  CT without concern critical dehydration/heptatic dysfunction/Uremia: CMP without concern Hypercarbia/Intoxication/withdrawal: patient history inconsistent with this diagnosis  endocrine abnormality: Sepsis: patient afebrile with stable vitals   Social Determinants of Health:  none     {Document critical care time when appropriate:1} {Document review of labs and clinical decision tools ie heart score, Chads2Vasc2 etc:1}  {Document your independent review of radiology images, and any outside records:1} {Document your discussion with family members, caretakers, and with consultants:1} {Document social determinants of health affecting pt's care:1} {Document your decision making why or why not admission, treatments were needed:1} Final Clinical Impression(s) / ED Diagnoses Final diagnoses:  None    Rx / DC Orders ED Discharge Orders     None

## 2022-09-08 NOTE — Discharge Instructions (Signed)
It was a pleasure caring for you today. Lab work was reassuring. CT scan of head was not concerning for stroke. Urinalysis was concerned for UTI - for which you are already on antibiotics for. It is important that you continue taking your antibiotics for the entire course.   Seek emergency care if experiencing any new or worsening symptoms.

## 2022-09-10 DIAGNOSIS — R55 Syncope and collapse: Secondary | ICD-10-CM | POA: Diagnosis not present

## 2022-09-13 IMAGING — CR DG CHEST 2V
2 series · 2 of 2 positions shown · non-contrast
Comparison: 04/10/2020

CLINICAL DATA: Chest pain

EXAM:
CHEST - 2 VIEW

[chest pa]
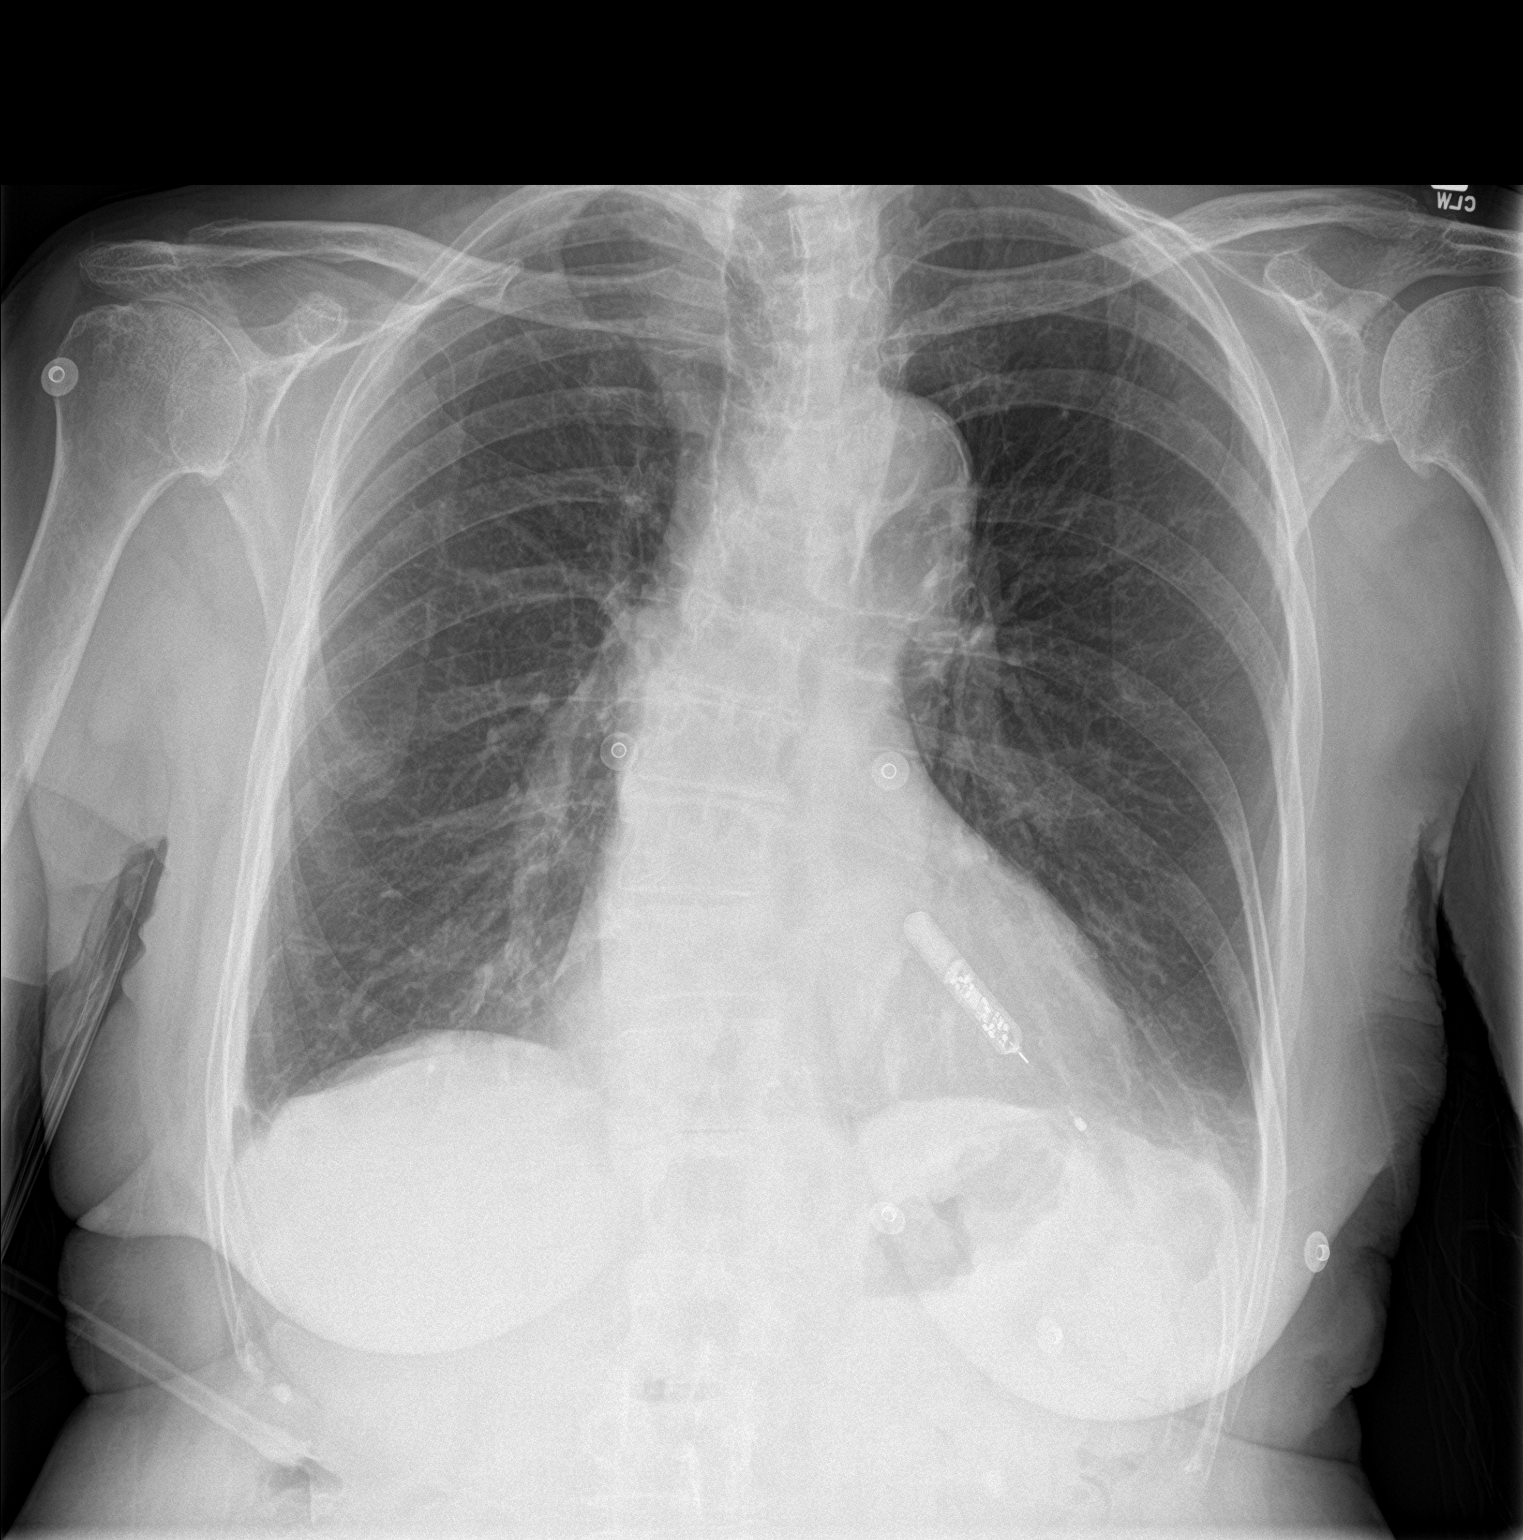

[chest lat]
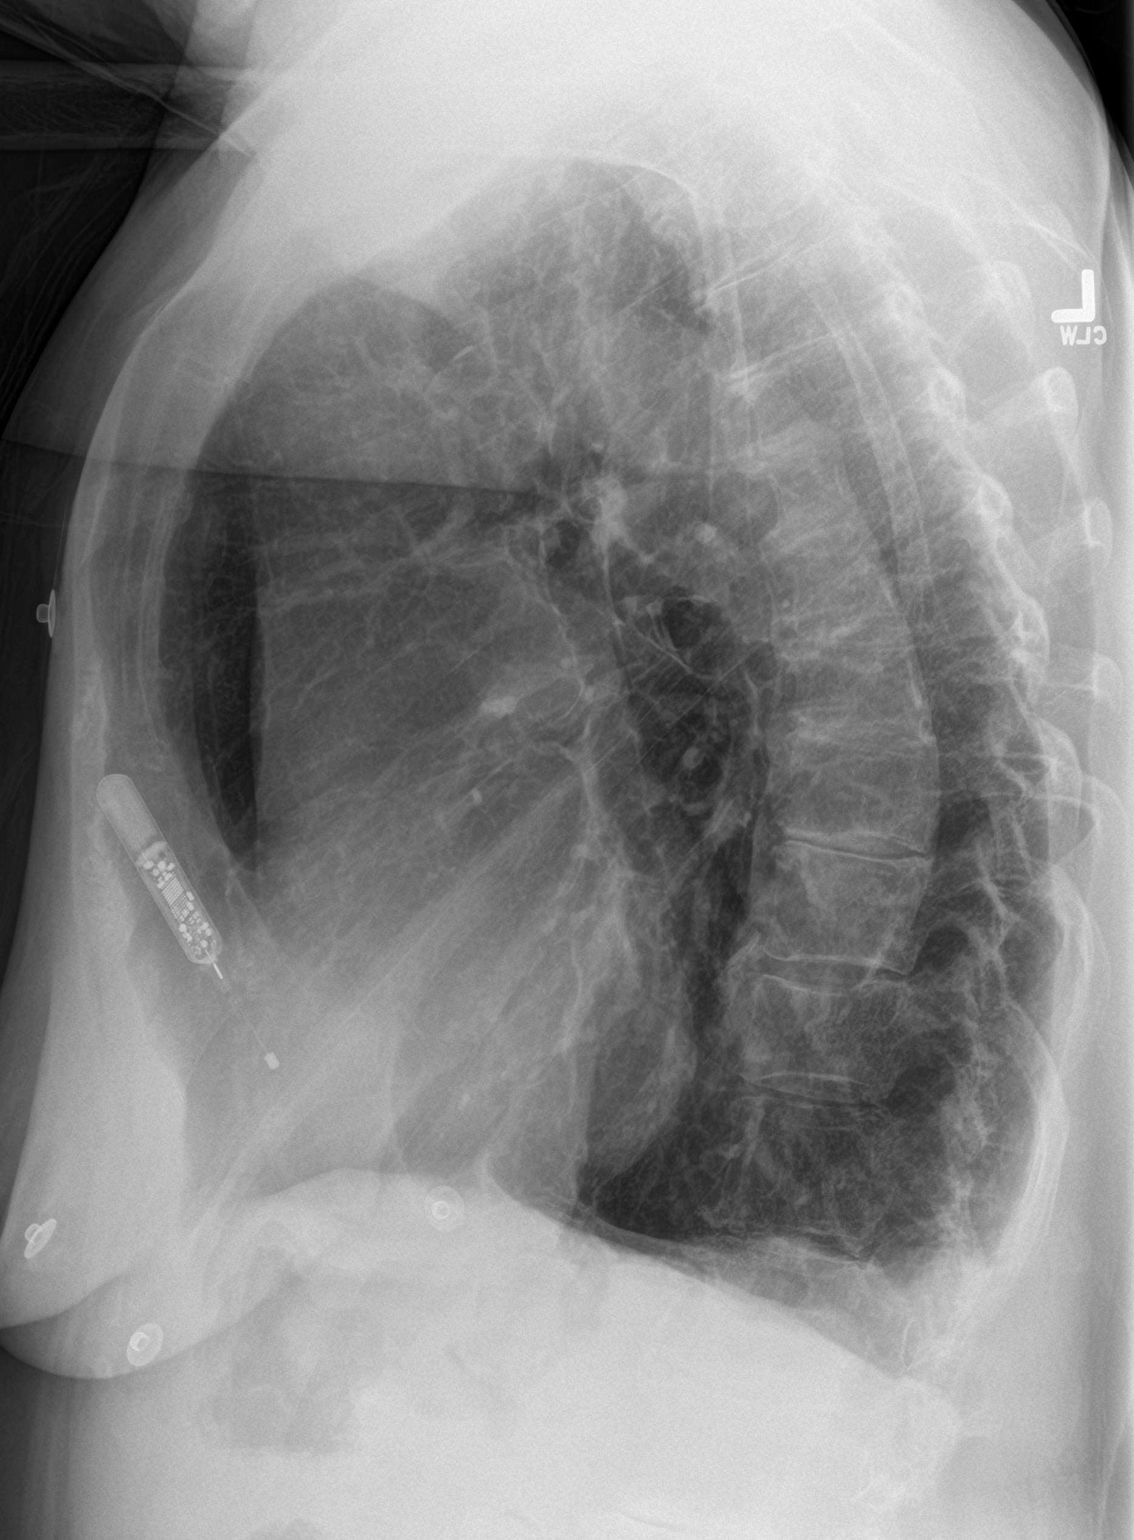

[2 of 2 positions shown; findings below may reference images not displayed]

FINDINGS: Loop recorder is identified in the projection of the left heart.
Heart size and mediastinal contours are stable. Lungs are
hyperinflated with flattening of the hemidiaphragms as before. No
pleural effusion or interstitial edema. No airspace opacities.
Spondylosis noted within the thoracic spine.
IMPRESSION: No acute cardiopulmonary abnormalities.

## 2022-09-13 IMAGING — CT CT ANGIO CHEST
2 of 7 series · 18 of 46 positions shown · IV contrast (agent unspecified)
Comparison: 11/06/2017

CLINICAL DATA: Chest pain.  Positive D-dimer.

EXAM:
CT ANGIOGRAPHY CHEST WITH CONTRAST
TECHNIQUE: Multidetector CT imaging of the chest was performed using the
standard protocol during bolus administration of intravenous
contrast. Multiplanar CT image reconstructions and MIPs were
obtained to evaluate the vascular anatomy.

[Series 6: thins · axial · 0.76mm/px · z∈[+966,+1228]mm · 15 of 295 slices shown]
[im 16/295  lung]
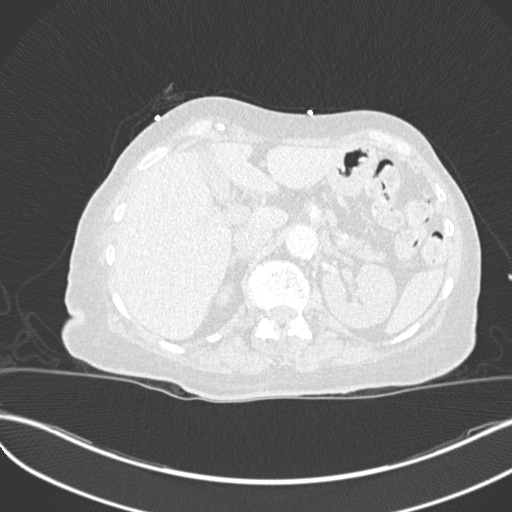
[im 31/295  soft-tissue]
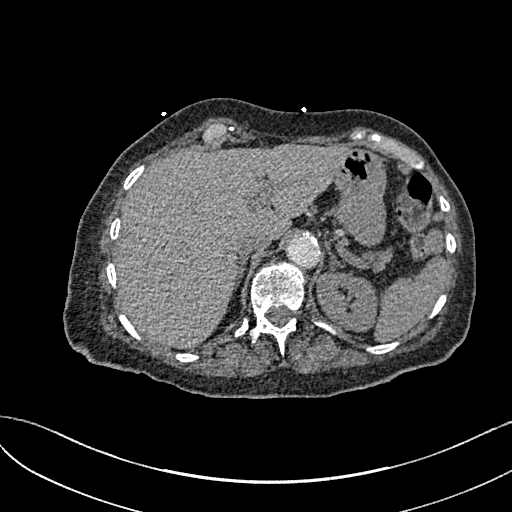
[im 62/295  lung]
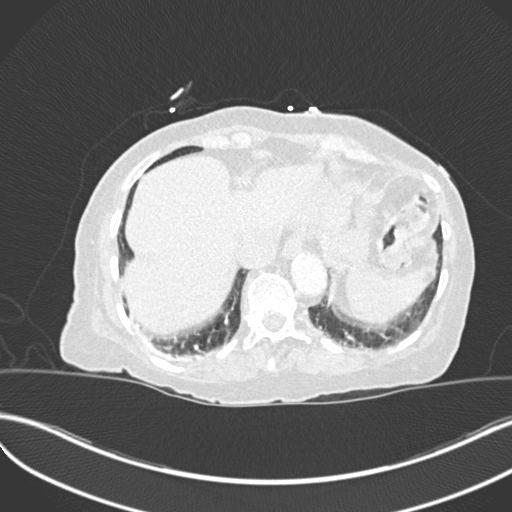
[im 78/295  soft-tissue]
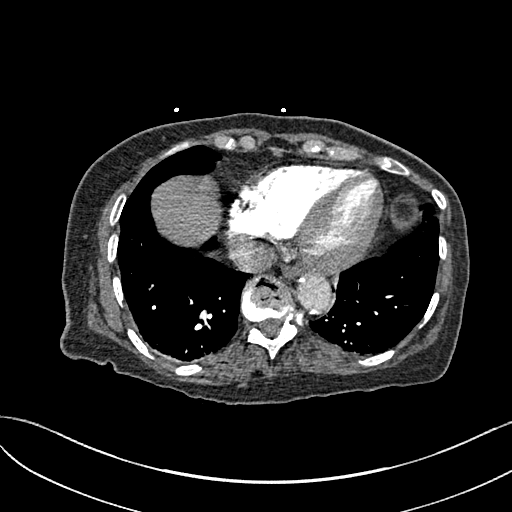
[im 93/295  lung]
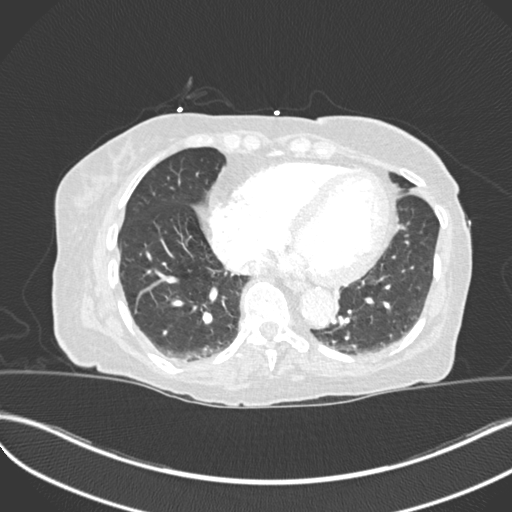
[im 109/295  soft-tissue]
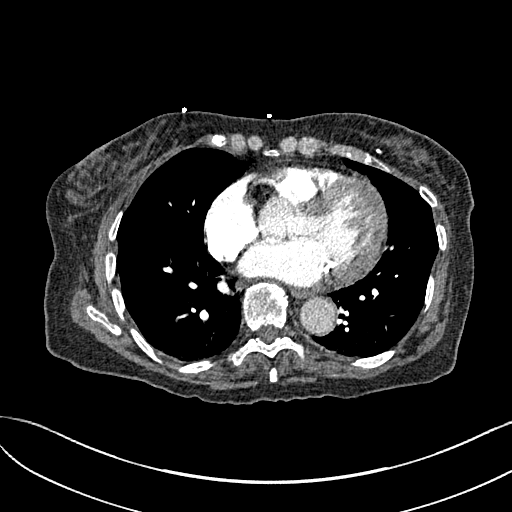
[im 124/295  lung]
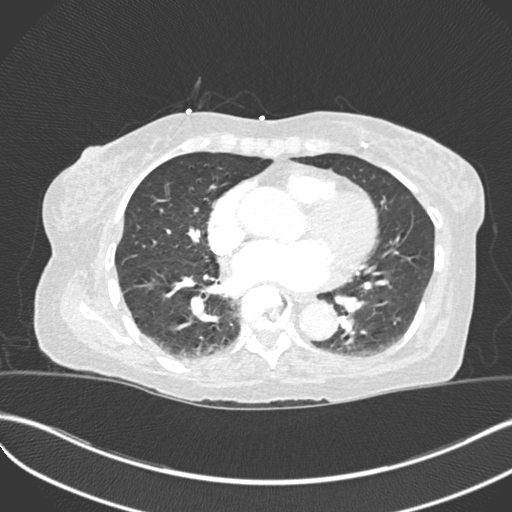
[im 155/295  soft-tissue]
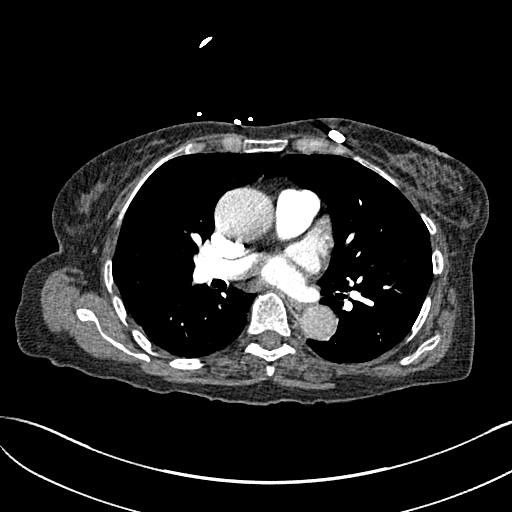
[im 171/295  lung]
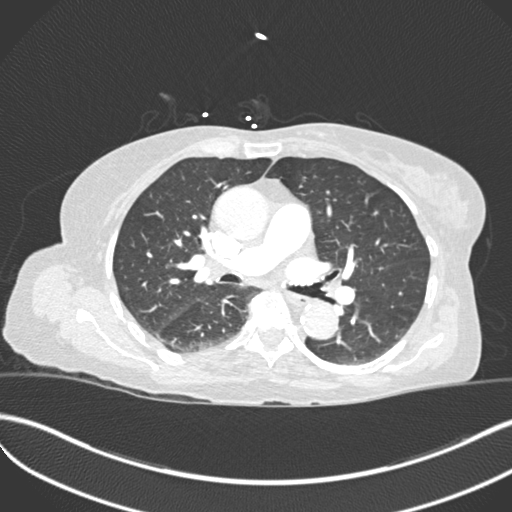
[im 186/295  soft-tissue]
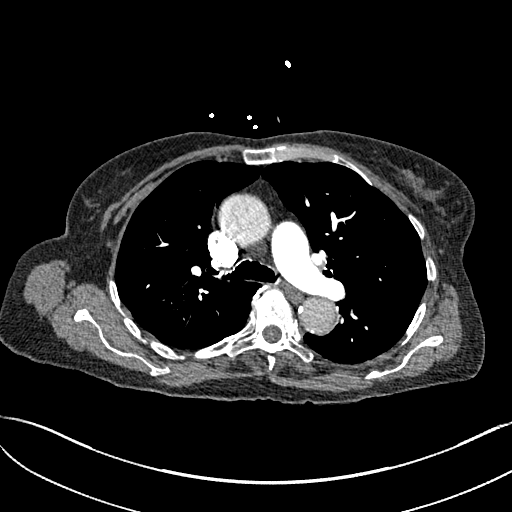
[im 202/295  lung]
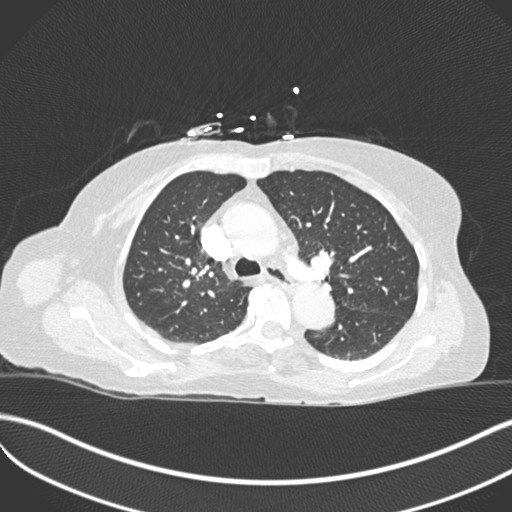
[im 217/295  soft-tissue]
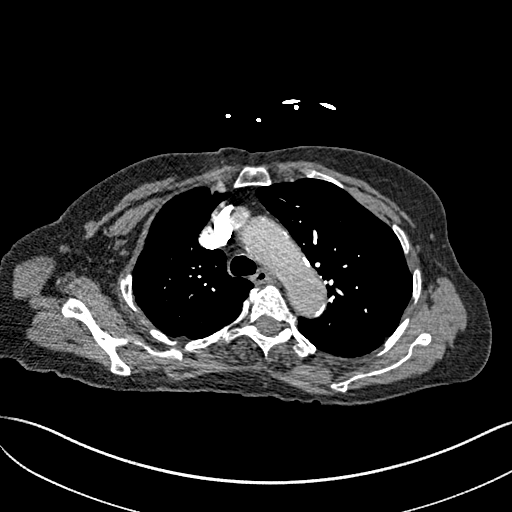
[im 248/295  lung]
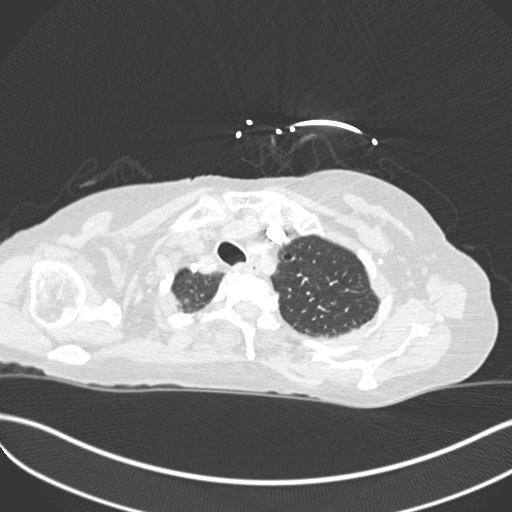
[im 264/295  soft-tissue]
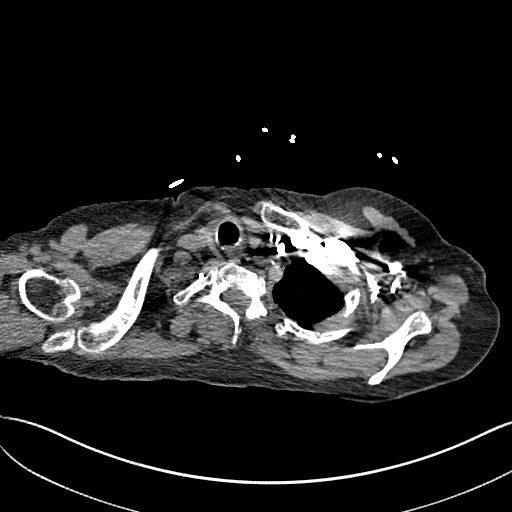
[im 279/295  lung]
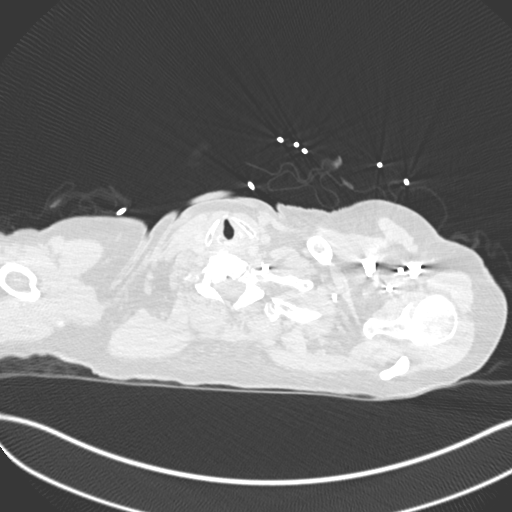

[Series 8: coronal mpr · coronal · 0.64mm/px · 3 of 111 slices shown]
[im 28/111  soft-tissue]
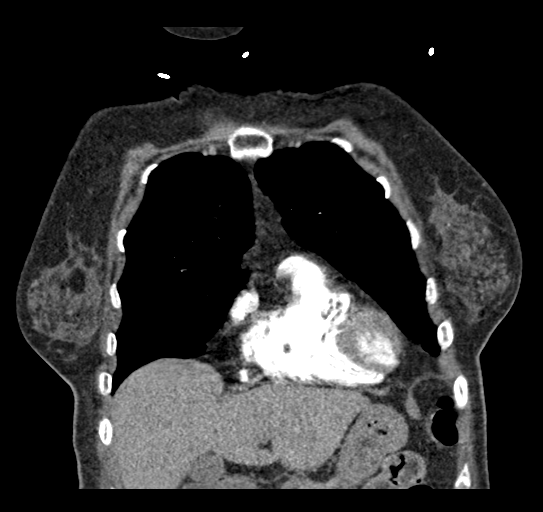
[im 56/111  soft-tissue]
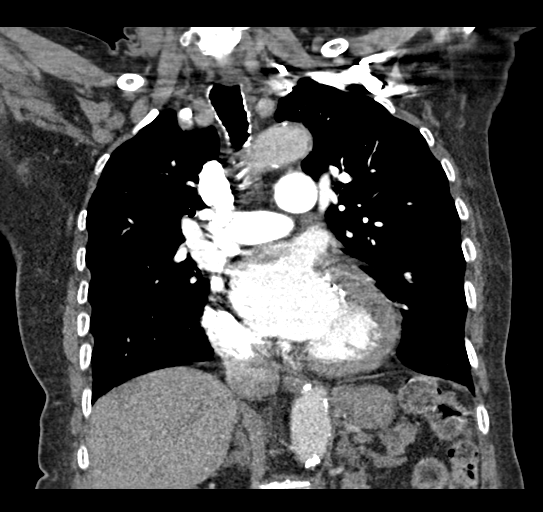
[im 83/111  soft-tissue]
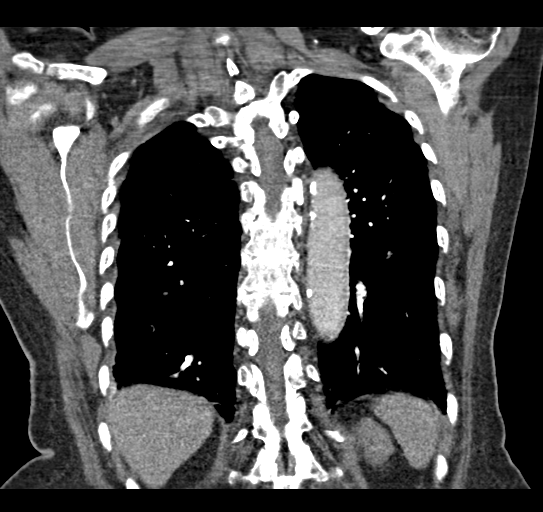

[18 of 46 positions shown; findings below may reference images not displayed]

RADIATION DOSE REDUCTION: This exam was performed according to the
departmental dose-optimization program which includes automated
exposure control, adjustment of the mA and/or kV according to
patient size and/or use of iterative reconstruction technique.

CONTRAST:  63mL OMNIPAQUE IOHEXOL 350 MG/ML SOLN
FINDINGS: Cardiovascular: Pulmonary arteries are well opacified. There is no
evidence of a pulmonary embolism.

Heart is top-normal in size. No pericardial effusion. Mild
three-vessel coronary artery calcifications. Ascending aorta is
dilated to 4.2 cm. Mild aortic atherosclerosis. No dissection.

Mediastinum/Nodes: No neck base, mediastinal or hilar masses or
enlarged lymph nodes. Trachea and esophagus are unremarkable.

Lungs/Pleura: Minimal dependent subsegmental atelectasis. No lung
consolidation or edema. No mass or suspicious nodule. No pleural
effusion or pneumothorax.

Upper Abdomen: No acute abnormality.

Musculoskeletal: Mild compression fracture of the upper endplate of
T11, new since the 6983 CT, not convincingly present on a lateral
chest radiograph from 01/24/2020. No other fractures. No bone
lesions.

Review of the MIP images confirms the above findings.
IMPRESSION: 1. No evidence of a pulmonary embolism.
2. No acute findings in the lungs.
3. Dilated ascending thoracic aorta to 4.2 cm. Recommend annual
imaging followup by CTA or MRA. This recommendation follows 7202
ACCF/AHA/AATS/ACR/ASA/SCA/RAKSE/ZEINAB/DANIELLE/ZEINAB Guidelines for the
Diagnosis and Management of Patients with Thoracic Aortic Disease.
Circulation. 7202; 121: [id number]. Aortic aneurysm NOS (NTOQN-TM0.R)
4. Aortic atherosclerosis.
5. Mild compression fracture of T11 of unclear chronicity, new since
6983, possibly acute/recent. This could be further assessed, if
desired clinically, with thoracic spine MRI.

Aortic aneurysm NOS (NTOQN-TM0.R).

## 2022-09-14 ENCOUNTER — Ambulatory Visit: Payer: Medicare PPO | Admitting: Internal Medicine

## 2022-09-14 ENCOUNTER — Telehealth: Payer: Self-pay | Admitting: *Deleted

## 2022-09-14 NOTE — Telephone Encounter (Signed)
Transition Care Management Unsuccessful Follow-up Telephone Call  Date of discharge and from where:  [facility]   Attempts:  1st Attempt  Reason for unsuccessful TCM follow-up call:  Left voice message

## 2022-09-14 NOTE — Progress Notes (Signed)
No show

## 2022-09-15 ENCOUNTER — Telehealth: Payer: Self-pay | Admitting: *Deleted

## 2022-09-15 NOTE — Telephone Encounter (Signed)
Transition Care Management Unsuccessful Follow-up Telephone Call  Date of discharge and from where:  [facility] 09/08/2022  Attempts:  2nd Attempt  Reason for unsuccessful TCM follow-up call:  Left voice message

## 2022-09-18 ENCOUNTER — Encounter: Payer: Self-pay | Admitting: Cardiology

## 2022-09-18 ENCOUNTER — Ambulatory Visit: Payer: Medicare PPO | Admitting: Cardiology

## 2022-09-18 VITALS — BP 132/58 | HR 57 | Ht 65.0 in | Wt 150.0 lb

## 2022-09-18 DIAGNOSIS — Z7901 Long term (current) use of anticoagulants: Secondary | ICD-10-CM | POA: Diagnosis not present

## 2022-09-18 DIAGNOSIS — I480 Paroxysmal atrial fibrillation: Secondary | ICD-10-CM | POA: Diagnosis not present

## 2022-09-18 DIAGNOSIS — E782 Mixed hyperlipidemia: Secondary | ICD-10-CM | POA: Diagnosis not present

## 2022-09-18 DIAGNOSIS — R55 Syncope and collapse: Secondary | ICD-10-CM

## 2022-09-18 DIAGNOSIS — I10 Essential (primary) hypertension: Secondary | ICD-10-CM

## 2022-09-18 DIAGNOSIS — Z79899 Other long term (current) drug therapy: Secondary | ICD-10-CM | POA: Diagnosis not present

## 2022-09-18 DIAGNOSIS — I951 Orthostatic hypotension: Secondary | ICD-10-CM

## 2022-09-18 MED ORDER — DILTIAZEM HCL ER COATED BEADS 180 MG PO CP24
180.0000 mg | ORAL_CAPSULE | Freq: Every day | ORAL | 0 refills | Status: DC
Start: 2022-09-18 — End: 2022-10-14

## 2022-09-18 NOTE — Progress Notes (Signed)
ID:  Cynthia [name], DOB 1940/04/21, MRN [id number]  PCP:  Merri Brunette, MD  Cardiologist:  Tessa Lerner, DO, [facility] (established care 09/18/22) Former Cardiology Providers: Dr. Yates Decamp, Elvin So, PA  Chief Complaint  Patient presents with   Follow-up    Atrial fibrillation management. Review test results    HPI  Cynthia [name] is a 82 y.o. Caucasian female whose past medical history and cardiovascular risk factors include: Paroxysmal atrial fibrillation, hypertension, hyperlipidemia, history of frequent urinary tract infections, OSA on CPAP, postmenopausal female.  Patient is being followed by the practice for management of paroxysmal atrial fibrillation.  She is currently on AV nodal blocking agents (diltiazem and metoprolol) and antiarrhythmics (amiodarone).  She is on anticoagulation for thromboembolic prophylaxis.  She does not endorse any evidence of bleeding.  She was on flecainide in the past which was discontinued due to dizziness.  She has been on amiodarone for some time with no recollection of prior PFTs or drug monitoring.  Since last office visit she has had a Zio patch results reviewed with both the patient and her husband at today's office visit.  In summary, Zio patch did not illustrate reoccurrence of atrial fibrillation during the monitoring period.  She continues to have lightheaded and dizziness more pronounced with changing positions as well as hot summer days.  She tries to change positions slowly to help mitigate symptom onset.  CARDIAC DATABASE: EKG: September 18, 2022: Sinus bradycardia, 55 bpm, left axis, left anterior fascicular block,  Echocardiogram: 12/19/2020: Normal LV systolic function with visual EF 60-65%. Left ventricle cavity is normal in size. Mild left ventricular hypertrophy. Normal global wall motion. Normal diastolic filling pattern, normal LAP. Mild (Grade I) mitral regurgitation. Mild calcification of the mitral valve annulus. Mild  tricuspid regurgitation. No evidence of pulmonary hypertension. Compared to study 11/09/2017 no significant change.   Stress Testing: Lexiscan Nuclear stress test 11/20/2020: Nondiagnostic ECG stress. Myocardial perfusion is normal. Overall LV systolic function is normal without regional wall motion abnormalities. Stress LV EF: 50%. No previous exam available for comparison. Low risk study.  Coronary angiogram 11/24/2016:  Normal coronaries; tortuous LAD  Carotid duplex: 03/27/2018:  Right Carotid: Velocities in the right ICA are consistent with a 1-39% stenosis. Left Carotid: Velocities in the left ICA are consistent with a 1-39% stenosis. Vertebrals:  Bilateral vertebral arteries demonstrate antegrade flow. Subclavians: Normal flow hemodynamics were seen in bilateral subclavian arteries.  Cardiac monitor: Event Monitor 30 days 11/11/2017: Predominant rhythm is normal sinus rhythm. Symptoms of fatigue reveals normal sinus rhythm. Asymptomatic atrial fibrillation and probable atypical atrial flutter noted on 11/13/2017, 11/18/2017 and 12/09/2017 at 3:00 AM. Occasional PACs. Arrhythmia/PVC burden 6%.  Event Monitor for 30 days Start date 07/01/2018 for syncope : A. Fib one episode, no symptoms reported. No heart block or significant bradycardia  Cardiac monitor (Zio Patch): Aug 20, 2022-May 2030 2024 Dominant rhythm sinus. Heart rate 45-152 bpm.  Avg HR 63 bpm. No atrial fibrillation, ventricular tachycardia, high grade AV block, pauses (3 seconds or longer). Total ventricular ectopic burden <1%. Total supraventricular ectopic burden <1%. Rare episodes of PSVT/atrial tach Patient triggered events: 3.  Underlying rhythm was sinus without arrhythmia   Loop Recorder Implantation 05/23/2020  Scheduled Remote loop recorder check 06/04/2020: Unremarkable transmission. Occasional PVC. No heart block, no atrial fibrillation. No symptoms reported   Remote loop recorder transmission  08/22/2022:  Predominant rhythm is normal sinus rhythm.  No further atrial fibrillation since 07/04/2021.  AT/AF burden <0.1%. No  heart block. No symptoms reported.   RADIOLOGY: CT PE protocol April 2024: 1. No pulmonary emboli or acute abnormality. 2. Calcific coronary artery and aortic atherosclerosis. 3. 4.1 cm ascending thoracic aortic aneurysm. Recommend annual imaging followup by CTA or MRA. This recommendation follows 2010 ACCF/AHA/AATS/ACR/ASA/SCA/SCAI/SIR/STS/SVM Guidelines for the Diagnosis and Management of Patients with Thoracic Aortic Disease. Circulation. 2010; 121: Z610-R604. Aortic aneurysm NOS (ICD10-I71.9)   Aortic Atherosclerosis (ICD10-I70.0).  Chest x-ray July 19, 2022: New LEFT lower lobe basilar platelike opacity. Differential considerations include infection or atelectasis.   Recommend follow-up PA and lateral chest radiograph 4 weeks after appropriate treatment to assess for resolution.  ALLERGIES: Allergies  Allergen Reactions   Flecainide Other (See Comments)    Severe dizziness   Oxycodone Other (See Comments)    uphoria   Trimethoprim Other (See Comments)    Other reaction(s): hives   Alendronate Sodium Nausea Only   Avelox [Moxifloxacin Hcl In Nacl] Other (See Comments)    Unknown reaction   Ciprofloxacin Other (See Comments)    Other reaction(s): multiple medication interactions   Other Other (See Comments)    Avelox+cymbalta Together Other reaction(s): flu like symptoms    MEDICATION LIST PRIOR TO VISIT: Current Meds  Medication Sig   acetaminophen (TYLENOL) 500 MG tablet Take 1,000 mg by mouth at bedtime.   Alpha-Lipoic Acid (LIPOIC ACID PO) Take 600 mg by mouth daily.   amiodarone (PACERONE) 200 MG tablet Take 1 tablet (200 mg total) by mouth daily.   apixaban (ELIQUIS) 5 MG TABS tablet Take 1 tablet (5 mg total) by mouth 2 (two) times daily.   atorvastatin (LIPITOR) 10 MG tablet Take 1 tablet (10 mg total) by mouth daily.   AZO-CRANBERRY  PO Take 1 tablet by mouth daily.   Calcium Carbonate-Vitamin D3 600-400 MG-UNIT TABS Take 1 tablet by mouth daily.   cephALEXin (KEFLEX) 250 MG capsule Take 250 mg by mouth 4 (four) times daily.   denosumab (PROLIA) 60 MG/ML SOSY injection Inject 60 mg into the skin every 6 (six) months.   desmopressin (DDAVP) 0.2 MG tablet Take 3 tablets by mouth as needed (Uriniary tract).   diltiazem (CARDIZEM CD) 180 MG 24 hr capsule Take 1 capsule (180 mg total) by mouth daily.   fluticasone (FLONASE) 50 MCG/ACT nasal spray Place 1 spray into the nose at bedtime.   losartan (COZAAR) 100 MG tablet Take 50 mg by mouth every evening.   meclizine (ANTIVERT) 25 MG tablet Take 25 mg by mouth daily as needed for dizziness.   Melatonin 5 MG CAPS Take 10 mg by mouth at bedtime.   Multiple Vitamin (MULTIVITAMIN WITH MINERALS) TABS tablet Take 1-2 tablets by mouth daily. flintstone vitamins Chewable with Iron   multivitamin-iron-minerals-folic acid (CENTRUM) chewable tablet Chew 1 tablet by mouth daily.   nitroGLYCERIN (NITROLINGUAL) 0.4 MG/SPRAY spray Place 1 spray under the tongue every 5 (five) minutes x 3 doses as needed for chest pain.   Omega-3 1000 MG CAPS Take 2,000 mg by mouth daily.    ondansetron (ZOFRAN) 4 MG tablet Take 4 mg by mouth every 8 (eight) hours as needed for nausea or vomiting.   pantoprazole (PROTONIX) 40 MG tablet Take 40 mg by mouth daily before breakfast.    Pediatric Multiple Vitamins (FLINTSTONES MULTIVITAMIN) CHEW Chew 1 tablet by mouth daily.   Pediatric Multivit-Minerals (FLINTSTONES GUMMIES COMPLETE PO) Take by mouth.   thyroid (ARMOUR) 60 MG tablet Take 60 mg by mouth daily before breakfast.   vitamin C (ASCORBIC ACID)  500 MG tablet Take 500 mg by mouth daily.   [DISCONTINUED] diltiazem (CARDIZEM CD) 120 MG 24 hr capsule Take 1 capsule (120 mg total) by mouth daily.   [DISCONTINUED] metoprolol succinate (TOPROL-XL) 25 MG 24 hr tablet Take 1 tablet (25 mg total) by mouth daily.      PAST MEDICAL HISTORY: Past Medical History:  Diagnosis Date   Acute GI bleeding 10/27/2017   Arthritis    "joints" (11/10/2017)   Dizziness    Dysrhythmia    afib   Encounter for loop recorder check 06/09/2020   GERD (gastroesophageal reflux disease)    GI bleed 11/06/2017   Hearing loss    History of blood transfusion 10/2017   Hyperlipidemia    Hypertension    Hyperthyroidism    Loop recorder [employer] Loop Recorder 05/23/2020 05/23/2020   Melanoma (HCC)    "cut off my back"   Memory loss    Pneumonia    Sleep apnea    cpap   Syncope and collapse     PAST SURGICAL HISTORY: Past Surgical History:  Procedure Laterality Date   ABDOMINAL HYSTERECTOMY     "partial"   APPENDECTOMY     BIOPSY  11/17/2017   Procedure: BIOPSY;  Surgeon: Vida Rigger, MD;  Location: [facility];  Service: Endoscopy;;   COLONOSCOPY WITH PROPOFOL N/A 11/17/2017   Procedure: COLONOSCOPY WITH PROPOFOL;  Surgeon: Vida Rigger, MD;  Location: [facility];  Service: Endoscopy;  Laterality: N/A;   CRANIECTOMY FOR EXCISION OF ACOUSTIC NEUROMA     ESOPHAGOGASTRODUODENOSCOPY (EGD) WITH PROPOFOL N/A 10/28/2017   Procedure: ESOPHAGOGASTRODUODENOSCOPY (EGD) WITH PROPOFOL;  Surgeon: Vida Rigger, MD;  Location: [facility];  Service: Endoscopy;  Laterality: N/A;   FOOT SURGERY Bilateral 04/18/2020   KNEE ARTHROPLASTY Right 06/05/2021   Procedure: COMPUTER ASSISTED TOTAL KNEE ARTHROPLASTY;  Surgeon: Samson Frederic, MD;  Location: [facility];  Service: Orthopedics;  Laterality: Right;   KNEE ARTHROSCOPY Left    LEFT HEART CATH AND CORONARY ANGIOGRAPHY N/A 11/24/2016   Procedure: LEFT HEART CATH AND CORONARY ANGIOGRAPHY;  Surgeon: Corky Crafts, MD;  [facility] INV[facility]IVE CV LAB;  Service: Cardiovascular;  Laterality: N/A;   MELANOMA EXCISION     "off my back"   NASAL SINUS SURGERY     TONSILLECTOMY      FAMILY HISTORY: The patient family history includes Congestive Heart Failure in her mother; Hearing  loss in her brother; Heart disease in her brother and father.  SOCIAL HISTORY:  The patient  reports that she quit smoking about 44 years ago. Her smoking use included cigarettes. She has never used smokeless tobacco. She reports current alcohol use of about 1.0 standard drink of alcohol per week. She reports that she does not use drugs.  REVIEW OF SYSTEMS: Review of Systems  Cardiovascular:  Negative for chest pain, claudication, dyspnea on exertion, irregular heartbeat, leg swelling, near-syncope, orthopnea, palpitations, paroxysmal nocturnal dyspnea and syncope.  Respiratory:  Negative for shortness of breath.   Hematologic/Lymphatic: Negative for bleeding problem.  Musculoskeletal:  Negative for falls, muscle cramps and myalgias.  Neurological:  Positive for dizziness (quick change in position, too much heat exposure) and light-headedness (quick change in position, too much heat exposure).    PHYSICAL EXAM:    09/18/2022    1:21 PM 09/08/2022    4:15 PM 09/08/2022    4:00 PM  Vitals with BMI  Height 5\' 5"     Weight 150 lbs    BMI 24.96    Systolic 132 108  171  Diastolic 58 89 84  Pulse 57 65 67   Orthostatic VS for the past 72 hrs (Last 3 readings):  Orthostatic BP Patient Position BP Location Cuff Size Orthostatic Pulse  09/18/22 1341 133/75 Standing Left Arm Normal 60  09/18/22 1340 146/79 Sitting Left Arm Normal 59  09/18/22 1338 128/71 Supine Left Arm Normal 58   Physical Exam  Constitutional: No distress. She appears chronically ill.  Age appropriate, hemodynamically stable, walking w/ walker.  Neck: No JVD present.  Cardiovascular: Normal rate, regular rhythm, S1 normal, S2 normal, intact distal pulses and normal pulses. Exam reveals no gallop, no S3 and no S4.  No murmur heard. Pulmonary/Chest: Effort normal and breath sounds normal. No stridor. She has no wheezes. She has no rales.  Abdominal: Soft. Bowel sounds are normal. She exhibits no distension. There is no  abdominal tenderness.  Musculoskeletal:        General: No edema.     Cervical back: Neck supple.  Neurological: She is alert and oriented to person, place, and time. She has intact cranial nerves (2-12).  Skin: Skin is warm and moist.    LABORATORY DATA:    Latest Ref Rng & Units 09/08/2022   12:02 PM 09/08/2022   10:55 AM 08/04/2022   10:30 AM  CBC  WBC 4.0 - 10.5 K/uL  6.1  6.6   Hemoglobin 12.0 - 15.0 g/dL 16.1  09.6  04.5   Hematocrit 36.0 - 46.0 % 34.0  34.3  35.8   Platelets 150 - 400 K/uL  202  259        Latest Ref Rng & Units 09/08/2022   12:02 PM 09/08/2022   10:55 AM 08/04/2022   10:30 AM  CMP  Glucose 70 - 99 mg/dL 85  97  409   BUN 8 - 23 mg/dL 20  15  22    Creatinine 0.44 - 1.00 mg/dL 8.11  9.14  7.82   Sodium 135 - 145 mmol/L 141  138  135   Potassium 3.5 - 5.1 mmol/L 3.8  3.5  3.8   Chloride 98 - 111 mmol/L 105  105  102   CO2 22 - 32 mmol/L  25  24   Calcium 8.9 - 10.3 mg/dL  9.4  8.9   Total Protein 6.5 - 8.1 g/dL  6.3    Total Bilirubin 0.3 - 1.2 mg/dL  0.6    Alkaline Phos 38 - 126 U/L  40    AST 15 - 41 U/L  22    ALT 0 - 44 U/L  17      Lab Results  Component Value Date   CHOL 165 11/24/2016   HDL 61 11/24/2016   LDLCALC 86 11/24/2016   TRIG 88 11/24/2016   CHOLHDL 2.7 11/24/2016   No components found for: "NTPROBNP" No results for input(s): "PROBNP" in the last 8760 hours. No results for input(s): "TSH" in the last 8760 hours.  BMP Recent Labs    07/19/22 1248 08/04/22 1030 09/08/22 1055 09/08/22 1202  NA 136 135 138 141  K 4.3 3.8 3.5 3.8  CL 104 102 105 105  CO2 25 24 25   --   GLUCOSE 111* 104* 97 85  BUN 26* 22 15 20   CREATININE 0.81 0.74 0.93 0.90  CALCIUM 8.3* 8.9 9.4  --   GFRNONAA >60 >60 >60  --     HEMOGLOBIN A1C Lab Results  Component Value Date   HGBA1C 5.6 11/24/2016   MPG  114 11/24/2016   External labs:  06/13/2020: Glucose 89, sodium 141, potassium 3.8, BUN 34, creatinine 0.8, GFR >60, AST 16, ALT 23, alk  phos 40   03/11/2020: Hemoglobin 10.9, hematocrit 34.2, platelet 241, MCV 94.7 Total cholesterol 165, triglycerides 56, HDL 65, LDL 89 TSH 0.24   Cholesterol, total 145.000 m 09/01/2019 HDL 67.000 mg 10/13/2019 LDL-C 94.000 cal 10/13/2019 Triglycerides 80.000 mg 10/13/2019   Hemoglobin 10.900 g/d 01/24/2020 INR 1.160 11/06/2017 Platelets 197.000 K/ 01/24/2020   Creatinine, Serum 0.580 mg/ 01/24/2020 Potassium 3.500 mm 01/24/2020 ALT (SGPT) 15.000 IU/ 09/01/2019   TSH 0.870 10/13/2019   Hemoglobin 9.300 12/14/2018;  INR 1.160 11/06/2017 Platelets 188.000 12/14/2018   Creatinine, Serum 0.970 12/14/2018 Potassium 4.000 12/14/2018 Magnesium N/D ALT (SGPT) 15.000 12/14/2018   Labs 11/02/2018: BUN 26, creatinine 0.9, EGFR greater than 60 mL, Hb 10.6/HCT 32.2, platelets 244.   IMPRESSION:    ICD-10-CM   1. Paroxysmal atrial fibrillation (HCC)  I48.0 diltiazem (CARDIZEM CD) 180 MG 24 hr capsule    TSH    Pulmonary function test    2. Long term current use of antiarrhythmic drug  Z79.899 TSH    Pulmonary function test    3. Long term (current) use of anticoagulants  Z79.01     4. Orthostatic hypotension  I95.1     5. Near syncope  R55 EKG 12-Lead    6. Essential hypertension, benign  I10     7. Mixed hyperlipidemia  E78.2        RECOMMENDATIONS: Cynthia [name] is a 82 y.o. Caucasian female whose past medical history and cardiac risk factors include: Paroxysmal atrial fibrillation, hypertension, hyperlipidemia, history of frequent urinary tract infections, OSA on CPAP, postmenopausal female.  Paroxysmal atrial fibrillation (HCC) Long term current use of antiarrhythmic drug Long term (current) use of anticoagulants Rate control: Diltiazem. Rhythm control: Amiodarone. Thromboembolic prophylaxis: Eliquis. For reasons unknown she is on low doses of both diltiazem and metoprolol.  The help reduce the number of medications will discontinue metoprolol and increase diltiazem to 180 mg p.o.  daily. She was on flecainide in the past which was discontinued due to dizziness. Currently on amiodarone for rhythm control strategy. Will check TSH as well as PFTs for drug monitoring.  She recently had a chest x-ray.  I have advised her to get a annual eye exam and informing her provider that she is on amiodarone. Click Here to Calculate/Change CHADS2VASc Score The patient's CHADS2-VASc score is 5, indicating a 7.2% annual risk of stroke.     CHF History: No HTN History: Yes Diabetes History: No Stroke History: No Vascular Disease History: Yes  Orthostatic hypotension Near syncope Discussed symptoms of orthostasis. Orthostatic vital signs negative. Blood pressures are well-controlled. Recommend compression stockings at least 6 to 8 hours a day.  Reemphasized importance of keeping yourself hydrated, changing positions slowly.  Essential hypertension, benign Office blood pressures are well-controlled. No changes in medications warranted at this time  Mixed hyperlipidemia Currently on atorvastatin.   She denies myalgia or other side effects. Currently managed by primary care provider.  FINAL MEDICATION LIST END OF ENCOUNTER: Meds ordered this encounter  Medications   diltiazem (CARDIZEM CD) 180 MG 24 hr capsule    Sig: Take 1 capsule (180 mg total) by mouth daily.    Dispense:  30 capsule    Refill:  0    Medications Discontinued During This Encounter  Medication Reason   metoprolol succinate (TOPROL-XL) 25 MG 24 hr tablet    diltiazem (  CARDIZEM CD) 120 MG 24 hr capsule Dose change     Current Outpatient Medications:    acetaminophen (TYLENOL) 500 MG tablet, Take 1,000 mg by mouth at bedtime., Disp: , Rfl:    Alpha-Lipoic Acid (LIPOIC ACID PO), Take 600 mg by mouth daily., Disp: , Rfl:    amiodarone (PACERONE) 200 MG tablet, Take 1 tablet (200 mg total) by mouth daily., Disp: 90 tablet, Rfl: 3   apixaban (ELIQUIS) 5 MG TABS tablet, Take 1 tablet (5 mg total) by mouth 2  (two) times daily., Disp: 60 tablet, Rfl: 6   atorvastatin (LIPITOR) 10 MG tablet, Take 1 tablet (10 mg total) by mouth daily., Disp: 90 tablet, Rfl: 3   AZO-CRANBERRY PO, Take 1 tablet by mouth daily., Disp: , Rfl:    Calcium Carbonate-Vitamin D3 600-400 MG-UNIT TABS, Take 1 tablet by mouth daily., Disp: , Rfl:    cephALEXin (KEFLEX) 250 MG capsule, Take 250 mg by mouth 4 (four) times daily., Disp: , Rfl:    denosumab (PROLIA) 60 MG/ML SOSY injection, Inject 60 mg into the skin every 6 (six) months., Disp: , Rfl:    desmopressin (DDAVP) 0.2 MG tablet, Take 3 tablets by mouth as needed (Uriniary tract)., Disp: , Rfl:    diltiazem (CARDIZEM CD) 180 MG 24 hr capsule, Take 1 capsule (180 mg total) by mouth daily., Disp: 30 capsule, Rfl: 0   fluticasone (FLONASE) 50 MCG/ACT nasal spray, Place 1 spray into the nose at bedtime., Disp: , Rfl:    losartan (COZAAR) 100 MG tablet, Take 50 mg by mouth every evening., Disp: , Rfl:    meclizine (ANTIVERT) 25 MG tablet, Take 25 mg by mouth daily as needed for dizziness., Disp: , Rfl:    Melatonin 5 MG CAPS, Take 10 mg by mouth at bedtime., Disp: , Rfl:    Multiple Vitamin (MULTIVITAMIN WITH MINERALS) TABS tablet, Take 1-2 tablets by mouth daily. flintstone vitamins Chewable with Iron, Disp: , Rfl:    multivitamin-iron-minerals-folic acid (CENTRUM) chewable tablet, Chew 1 tablet by mouth daily., Disp: , Rfl:    nitroGLYCERIN (NITROLINGUAL) 0.4 MG/SPRAY spray, Place 1 spray under the tongue every 5 (five) minutes x 3 doses as needed for chest pain., Disp: 12 g, Rfl: 1   Omega-3 1000 MG CAPS, Take 2,000 mg by mouth daily. , Disp: , Rfl:    ondansetron (ZOFRAN) 4 MG tablet, Take 4 mg by mouth every 8 (eight) hours as needed for nausea or vomiting., Disp: , Rfl:    pantoprazole (PROTONIX) 40 MG tablet, Take 40 mg by mouth daily before breakfast. , Disp: , Rfl: 1   Pediatric Multiple Vitamins (FLINTSTONES MULTIVITAMIN) CHEW, Chew 1 tablet by mouth daily., Disp: , Rfl:     Pediatric Multivit-Minerals (FLINTSTONES GUMMIES COMPLETE PO), Take by mouth., Disp: , Rfl:    thyroid (ARMOUR) 60 MG tablet, Take 60 mg by mouth daily before breakfast., Disp: , Rfl:    vitamin C (ASCORBIC ACID) 500 MG tablet, Take 500 mg by mouth daily., Disp: , Rfl:   Orders Placed This Encounter  Procedures   TSH   EKG 12-Lead   Pulmonary function test    There are no Patient Instructions on file for this visit.   --Continue cardiac medications as reconciled in final medication list. --Return in about 6 months (around 03/20/2023) for Follow up, A. fib. or sooner if needed. --Continue follow-up with your primary care physician regarding the management of your other chronic comorbid conditions.  Patient's questions and concerns  were addressed to her satisfaction. She voices understanding of the instructions provided during this encounter.   This note was created using a voice recognition software as a result there may be grammatical errors inadvertently enclosed that do not reflect the nature of this encounter. Every attempt is made to correct such errors.  Tessa Lerner, [place], [facility]  Pager:  [phone] Office: [phone]

## 2022-09-22 DIAGNOSIS — Z95818 Presence of other cardiac implants and grafts: Secondary | ICD-10-CM | POA: Diagnosis not present

## 2022-09-22 DIAGNOSIS — R55 Syncope and collapse: Secondary | ICD-10-CM | POA: Diagnosis not present

## 2022-09-22 DIAGNOSIS — Z4509 Encounter for adjustment and management of other cardiac device: Secondary | ICD-10-CM | POA: Diagnosis not present

## 2022-09-30 DIAGNOSIS — R55 Syncope and collapse: Secondary | ICD-10-CM | POA: Diagnosis not present

## 2022-10-06 DIAGNOSIS — R350 Frequency of micturition: Secondary | ICD-10-CM | POA: Diagnosis not present

## 2022-10-06 DIAGNOSIS — M25551 Pain in right hip: Secondary | ICD-10-CM | POA: Diagnosis not present

## 2022-10-07 ENCOUNTER — Ambulatory Visit: Payer: Medicare PPO | Admitting: Neurology

## 2022-10-07 DIAGNOSIS — N3000 Acute cystitis without hematuria: Secondary | ICD-10-CM | POA: Diagnosis not present

## 2022-10-07 DIAGNOSIS — N8110 Cystocele, unspecified: Secondary | ICD-10-CM | POA: Diagnosis not present

## 2022-10-07 DIAGNOSIS — R350 Frequency of micturition: Secondary | ICD-10-CM | POA: Diagnosis not present

## 2022-10-10 ENCOUNTER — Encounter (HOSPITAL_COMMUNITY): Payer: Self-pay | Admitting: Emergency Medicine

## 2022-10-10 ENCOUNTER — Emergency Department (HOSPITAL_COMMUNITY): Payer: Medicare PPO

## 2022-10-10 ENCOUNTER — Other Ambulatory Visit: Payer: Self-pay

## 2022-10-10 ENCOUNTER — Emergency Department (HOSPITAL_COMMUNITY)
Admission: EM | Admit: 2022-10-10 | Discharge: 2022-10-10 | Disposition: A | Discharge status: Home / Self Care | Payer: Medicare PPO | Attending: Emergency Medicine | Admitting: Emergency Medicine

## 2022-10-10 DIAGNOSIS — R059 Cough, unspecified: Secondary | ICD-10-CM | POA: Diagnosis not present

## 2022-10-10 DIAGNOSIS — R42 Dizziness and giddiness: Secondary | ICD-10-CM | POA: Diagnosis not present

## 2022-10-10 DIAGNOSIS — R55 Syncope and collapse: Secondary | ICD-10-CM | POA: Insufficient documentation

## 2022-10-10 DIAGNOSIS — E860 Dehydration: Secondary | ICD-10-CM | POA: Diagnosis not present

## 2022-10-10 DIAGNOSIS — Z7901 Long term (current) use of anticoagulants: Secondary | ICD-10-CM | POA: Insufficient documentation

## 2022-10-10 DIAGNOSIS — I10 Essential (primary) hypertension: Secondary | ICD-10-CM | POA: Insufficient documentation

## 2022-10-10 DIAGNOSIS — Z79899 Other long term (current) drug therapy: Secondary | ICD-10-CM | POA: Diagnosis not present

## 2022-10-10 DIAGNOSIS — E039 Hypothyroidism, unspecified: Secondary | ICD-10-CM | POA: Diagnosis not present

## 2022-10-10 DIAGNOSIS — R1031 Right lower quadrant pain: Secondary | ICD-10-CM | POA: Diagnosis not present

## 2022-10-10 DIAGNOSIS — R350 Frequency of micturition: Secondary | ICD-10-CM | POA: Diagnosis not present

## 2022-10-10 DIAGNOSIS — I517 Cardiomegaly: Secondary | ICD-10-CM | POA: Diagnosis not present

## 2022-10-10 DIAGNOSIS — R231 Pallor: Secondary | ICD-10-CM | POA: Diagnosis not present

## 2022-10-10 LAB — CBC
HCT: 34.8 % — ABNORMAL LOW (ref 36.0–46.0)
Hemoglobin: 11.6 g/dL — ABNORMAL LOW (ref 12.0–15.0)
MCH: 32.4 pg (ref 26.0–34.0)
MCHC: 33.3 g/dL (ref 30.0–36.0)
MCV: 97.2 fL (ref 80.0–100.0)
Platelets: 178 10*3/uL (ref 150–400)
RBC: 3.58 MIL/uL — ABNORMAL LOW (ref 3.87–5.11)
RDW: 12.5 % (ref 11.5–15.5)
WBC: 6.4 10*3/uL (ref 4.0–10.5)
nRBC: 0 % (ref 0.0–0.2)

## 2022-10-10 LAB — HEPATIC FUNCTION PANEL
ALT: 21 U/L (ref 0–44)
AST: 24 U/L (ref 15–41)
Albumin: 4.3 g/dL (ref 3.5–5.0)
Alkaline Phosphatase: 53 U/L (ref 38–126)
Bilirubin, Direct: 0.1 mg/dL (ref 0.0–0.2)
Indirect Bilirubin: 0.5 mg/dL (ref 0.3–0.9)
Total Bilirubin: 0.6 mg/dL (ref 0.3–1.2)
Total Protein: 7.4 g/dL (ref 6.5–8.1)

## 2022-10-10 LAB — LACTIC ACID, PLASMA
Lactic Acid, Venous: 1.8 mmol/L (ref 0.5–1.9)
Lactic Acid, Venous: 1.3 mmol/L (ref 0.5–1.9)

## 2022-10-10 LAB — URINALYSIS, ROUTINE W REFLEX MICROSCOPIC
Bacteria, UA: NONE SEEN
Bilirubin Urine: NEGATIVE
Glucose, UA: NEGATIVE mg/dL
Hgb urine dipstick: NEGATIVE
Ketones, ur: 5 mg/dL — AB
Nitrite: NEGATIVE
Protein, ur: NEGATIVE mg/dL
Specific Gravity, Urine: 1.018 (ref 1.005–1.030)
pH: 5 (ref 5.0–8.0)

## 2022-10-10 LAB — BASIC METABOLIC PANEL
Anion gap: 10 (ref 5–15)
BUN: 22 mg/dL (ref 8–23)
CO2: 27 mmol/L (ref 22–32)
Calcium: 9.8 mg/dL (ref 8.9–10.3)
Chloride: 99 mmol/L (ref 98–111)
Creatinine, Ser: 0.97 mg/dL (ref 0.44–1.00)
GFR, Estimated: 59 mL/min — ABNORMAL LOW (ref >60)
Glucose, Bld: 103 mg/dL — ABNORMAL HIGH (ref 70–99)
Potassium: 3.5 mmol/L (ref 3.5–5.1)
Sodium: 136 mmol/L (ref 135–145)

## 2022-10-10 LAB — CBG MONITORING, ED: Glucose-Capillary: 112 mg/dL — ABNORMAL HIGH (ref 70–99)

## 2022-10-10 LAB — TROPONIN I (HIGH SENSITIVITY)
Troponin I (High Sensitivity): 6 ng/L (ref <18)
Troponin I (High Sensitivity): 7 ng/L (ref <18)

## 2022-10-10 LAB — TSH: TSH: 1.233 u[IU]/mL (ref 0.350–4.500)

## 2022-10-10 LAB — MAGNESIUM: Magnesium: 2.3 mg/dL (ref 1.7–2.4)

## 2022-10-10 LAB — LIPASE, BLOOD: Lipase: 35 U/L (ref 11–51)

## 2022-10-10 MED ORDER — SODIUM CHLORIDE 0.9 % IV BOLUS
1000.0000 mL | Freq: Once | INTRAVENOUS | Status: AC
  Administered 2022-10-10: 1000 mL via INTRAVENOUS

## 2022-10-10 MED ORDER — IOHEXOL 350 MG/ML SOLN
75.0000 mL | Freq: Once | INTRAVENOUS | Status: AC | PRN
  Administered 2022-10-10: 75 mL via INTRAVENOUS

## 2022-10-10 NOTE — ED Notes (Signed)
Pt had poop in first UA sample will try again later

## 2022-10-10 NOTE — Discharge Instructions (Signed)
Your history, exam, workup today was overall reassuring.  We do not see evidence of persistent urinary tract infection but we did send a culture and you will be called if something is concerning and grows positive.  Your workup did not show evidence of a cardiac cause of your near syncope and lightheadedness and your other workup was overall reassuring.  We feel you are safe for discharge home.  Please follow-up with your primary doctor and cardiologist as we discussed.  If any symptoms change or worsen acutely, please return to the nearest emergency department.

## 2022-10-10 NOTE — ED Notes (Signed)
Patient transported to CT 

## 2022-10-10 NOTE — ED Provider Notes (Signed)
[place] [facility]ENC[facility] DEPARTMENT AT [facility] Provider Note   CSN: [id number] Arrival date & time: 10/10/22  1357     History  Chief Complaint  Patient presents with   Urinary Frequency   Dizziness    Cynthia [name] is a 82 y.o. female.  The history is provided by the patient and medical records. No language interpreter was used.  Dizziness Associated symptoms: no chest pain, no diarrhea, no headaches, no nausea, no shortness of breath and no vomiting   Dysuria Pain quality:  Aching Pain severity:  Moderate Onset quality:  Gradual Duration:  5 days Timing:  Constant Progression:  Waxing and waning Chronicity:  Recurrent Relieved by:  Nothing Worsened by:  Nothing Ineffective treatments:  Antibiotics Associated symptoms: abdominal pain   Associated symptoms: no fever, no flank pain, no nausea, no vaginal discharge and no vomiting   Near Syncope This is a recurrent problem. The current episode started 1 to 2 hours ago. The problem occurs rarely. The problem has been resolved. Associated symptoms include abdominal pain. Pertinent negatives include no chest pain, no headaches and no shortness of breath. Nothing aggravates the symptoms. Nothing relieves the symptoms. The treatment provided no relief.       Home Medications Prior to Admission medications   Medication Sig Start Date End Date Taking? Authorizing Provider  acetaminophen (TYLENOL) 500 MG tablet Take 1,000 mg by mouth at bedtime.    [provider]  Alpha-Lipoic Acid (LIPOIC ACID PO) Take 600 mg by mouth daily.    [provider]  amiodarone (PACERONE) 200 MG tablet Take 1 tablet (200 mg total) by mouth daily. 09/10/21   Cantwell, Celeste C, PA-C  apixaban (ELIQUIS) 5 MG TABS tablet Take 1 tablet (5 mg total) by mouth 2 (two) times daily. 04/15/22   Yates Decamp, MD  atorvastatin (LIPITOR) 10 MG tablet Take 1 tablet (10 mg total) by mouth daily. 08/13/22   Custovic, Rozell Searing, DO  AZO-CRANBERRY  PO Take 1 tablet by mouth daily.    [provider]  Calcium Carbonate-Vitamin D3 600-400 MG-UNIT TABS Take 1 tablet by mouth daily.    [provider]  cephALEXin (KEFLEX) 250 MG capsule Take 250 mg by mouth 4 (four) times daily.    [provider]  denosumab (PROLIA) 60 MG/ML SOSY injection Inject 60 mg into the skin every 6 (six) months.    [provider]  desmopressin (DDAVP) 0.2 MG tablet Take 3 tablets by mouth as needed (Uriniary tract).    [provider]  diltiazem (CARDIZEM CD) 180 MG 24 hr capsule Take 1 capsule (180 mg total) by mouth daily. 09/18/22 10/18/22  Tolia, Sunit, DO  fluticasone (FLONASE) 50 MCG/ACT nasal spray Place 1 spray into the nose at bedtime.    [provider]  losartan (COZAAR) 100 MG tablet Take 50 mg by mouth every evening. 04/15/20   [provider]  meclizine (ANTIVERT) 25 MG tablet Take 25 mg by mouth daily as needed for dizziness.    [provider]  Melatonin 5 MG CAPS Take 10 mg by mouth at bedtime.    [provider]  Multiple Vitamin (MULTIVITAMIN WITH MINERALS) TABS tablet Take 1-2 tablets by mouth daily. flintstone vitamins Chewable with Iron    [provider]  multivitamin-iron-minerals-folic acid (CENTRUM) chewable tablet Chew 1 tablet by mouth daily.    [provider]  nitroGLYCERIN (NITROLINGUAL) 0.4 MG/SPRAY spray Place 1 spray under the tongue every 5 (five) minutes x  3 doses as needed for chest pain. 03/19/21   Cantwell, Celeste C, PA-C  Omega-3 1000 MG CAPS Take 2,000 mg by mouth daily.     [provider]  ondansetron (ZOFRAN) 4 MG tablet Take 4 mg by mouth every 8 (eight) hours as needed for nausea or vomiting.    [provider]  pantoprazole (PROTONIX) 40 MG tablet Take 40 mg by mouth daily before breakfast.  06/12/14   [provider]  Pediatric Multiple Vitamins (FLINTSTONES MULTIVITAMIN) CHEW Chew 1 tablet by mouth daily.     [provider]  Pediatric Multivit-Minerals (FLINTSTONES GUMMIES COMPLETE PO) Take by mouth.    [provider]  thyroid (ARMOUR) 60 MG tablet Take 60 mg by mouth daily before breakfast. 11/07/17   [provider]  vitamin C (ASCORBIC ACID) 500 MG tablet Take 500 mg by mouth daily.    [provider]      Allergies    Flecainide, Oxycodone, Trimethoprim, Alendronate sodium, Avelox [moxifloxacin hcl in nacl], Ciprofloxacin, and Other    Review of Systems   Review of Systems  Constitutional:  Positive for fatigue. Negative for chills and fever.  HENT:  Negative for congestion.   Eyes:  Negative for visual disturbance.  Respiratory:  Negative for cough, chest tightness and shortness of breath.   Cardiovascular:  Positive for near-syncope. Negative for chest pain.  Gastrointestinal:  Positive for abdominal pain. Negative for constipation, diarrhea, nausea and vomiting.  Genitourinary:  Positive for dysuria and frequency. Negative for flank pain, vaginal bleeding and vaginal discharge.  Musculoskeletal:  Negative for back pain, neck pain and neck stiffness.  Skin:  Negative for rash and wound.  Neurological:  Negative for syncope (near syncope present) and headaches.  Psychiatric/Behavioral:  Negative for agitation.   All other systems reviewed and are negative.   Physical Exam Updated Vital Signs BP (!) 141/68   Pulse 64   Temp 97.9 F (36.6 C) (Oral)   Resp (!) 23   Ht 5\' 5"  (1.651 m)   Wt 68 kg   SpO2 99%   BMI 24.95 kg/m  Physical Exam Vitals and nursing note reviewed.  Constitutional:      General: She is not in acute distress.    Appearance: She is well-developed. She is not ill-appearing, toxic-appearing or diaphoretic.  HENT:     Head: Normocephalic and atraumatic.     Nose: No congestion or rhinorrhea.     Mouth/Throat:     Mouth: Mucous membranes are dry.     Pharynx: No oropharyngeal exudate or posterior oropharyngeal erythema.   Eyes:     Extraocular Movements: Extraocular movements intact.     Conjunctiva/sclera: Conjunctivae normal.     Pupils: Pupils are equal, round, and reactive to light.  Cardiovascular:     Rate and Rhythm: Normal rate and regular rhythm.     Heart sounds: No murmur heard. Pulmonary:     Effort: Pulmonary effort is normal. No respiratory distress.     Breath sounds: Normal breath sounds. No wheezing, rhonchi or rales.  Chest:     Chest wall: No tenderness.  Abdominal:     General: Abdomen is flat. There is no distension.     Palpations: Abdomen is soft.     Tenderness: There is abdominal tenderness in the right lower quadrant and suprapubic area. There is no right CVA tenderness, left CVA tenderness or rebound.    Musculoskeletal:        General: No swelling or  tenderness.     Cervical back: Neck supple.  Skin:    General: Skin is warm and dry.     Capillary Refill: Capillary refill takes less than 2 seconds.     Findings: No erythema or rash.  Neurological:     General: No focal deficit present.     Mental Status: She is alert.     Sensory: No sensory deficit.  Psychiatric:        Mood and Affect: Mood normal.     ED Results / Procedures / Treatments   Labs (all labs ordered are listed, but only abnormal results are displayed) Labs Reviewed  BASIC METABOLIC PANEL - Abnormal; Notable for the following components:      Result Value   Glucose, Bld 103 (*)    GFR, Estimated 59 (*)    All other components within normal limits  CBC - Abnormal; Notable for the following components:   RBC 3.58 (*)    Hemoglobin 11.6 (*)    HCT 34.8 (*)    All other components within normal limits  URINALYSIS, ROUTINE W REFLEX MICROSCOPIC - Abnormal; Notable for the following components:   APPearance HAZY (*)    Ketones, ur 5 (*)    Leukocytes,Ua TRACE (*)    All other components within normal limits  CBG MONITORING, ED - Abnormal; Notable for the following components:    Glucose-Capillary 112 (*)    All other components within normal limits  URINE CULTURE  LIPASE, BLOOD  LACTIC ACID, PLASMA  LACTIC ACID, PLASMA  HEPATIC FUNCTION PANEL  TSH  MAGNESIUM  TROPONIN I (HIGH SENSITIVITY)  TROPONIN I (HIGH SENSITIVITY)    EKG EKG Interpretation Date/Time:  Saturday October 10 2022 14:04:57 EDT Ventricular Rate:  63 PR Interval:  174 QRS Duration:  104 QT Interval:  478 QTC Calculation: 489 R Axis:   -66  Text Interpretation: Normal sinus rhythm Left anterior fascicular block Nonspecific T wave abnormality Abnormal ECG When compared with ECG of 08-Sep-2022 10:53, PREVIOUS ECG IS PRESENT when compared to prior, similar appearance. No STEMI Confirmed by Theda Belfast ([id number]) on 10/10/2022 4:26:45 PM  Radiology CT ABDOMEN PELVIS W CONTRAST  Result Date: 10/10/2022 CLINICAL DATA:  Right lower quadrant abdominal pain, UTI, near-syncope EXAM: CT ABDOMEN AND PELVIS WITH CONTRAST TECHNIQUE: Multidetector CT imaging of the abdomen and pelvis was performed using the standard protocol following bolus administration of intravenous contrast. RADIATION DOSE REDUCTION: This exam was performed according to the departmental dose-optimization program which includes automated exposure control, adjustment of the mA and/or kV according to patient size and/or use of iterative reconstruction technique. CONTRAST:  75mL OMNIPAQUE IOHEXOL 350 MG/ML SOLN COMPARISON:  CTA abdomen/pelvis dated 11/06/2017 FINDINGS: Lower chest: Mild subpleural reticulation/fibrosis at the lung bases. Hepatobiliary: Liver is within normal limits. Gallbladder is unremarkable. No intrahepatic or extrahepatic duct dilatation. Pancreas: Within normal limits. Spleen: Within normal limits. Adrenals/Urinary Tract: Adrenal glands are within normal limits. Kidneys are within normal limits. No renal calculi or hydronephrosis. Bladder is within normal limits. Stomach/Bowel: Stomach is notable for a tiny hiatal hernia. No  evidence of bowel obstruction. Appendix is not discretely visualized. No colonic wall thickening or inflammatory changes. Vascular/Lymphatic: No evidence of abdominal aortic aneurysm. Atherosclerotic calcifications of the abdominal aorta and branch vessels. No suspicious abdominopelvic lymphadenopathy. Reproductive: Status post hysterectomy. Left ovary is within normal limits.  No right adnexal mass. Other: No abdominopelvic ascites. Musculoskeletal: Degenerative changes of the visualized thoracolumbar spine. IMPRESSION: Negative CT abdomen/pelvis. Electronically Signed  By: Charline Bills M.D.   On: 10/10/2022 19:38   DG Chest Portable 1 View  Result Date: 10/10/2022 CLINICAL DATA:  Cough, lightheadedness EXAM: PORTABLE CHEST 1 VIEW COMPARISON:  07/19/2022 FINDINGS: Cardiomegaly with implantable loop recorder. Prominence of the aortic knob, increased compared to prior radiographs. Both lungs are clear. The visualized skeletal structures are unremarkable. IMPRESSION: 1. Cardiomegaly with implantable loop recorder. 2. Prominence of the aortic knob, increased compared to prior radiographs. This is most likely due to AP portable technique and slightly rotated projection, however thoracic aorta may be assessed by CT if there is clinical concern for dissection based on signs and symptoms. 3. No acute abnormality of the lungs. Electronically Signed   By: Jearld Lesch M.D.   On: 10/10/2022 18:31    Procedures Procedures    Medications Ordered in ED Medications  sodium chloride 0.9 % bolus 1,000 mL (has no administration in time range)    ED Course/ Medical Decision Making/ A&P                             Medical Decision Making Amount and/or Complexity of Data Reviewed Labs: ordered. Radiology: ordered.  Risk Prescription drug management.    Cintya Forgie is a 82 y.o. female with a past medical history significant for previous appendectomy, hypertension, hypothyroidism, previous GI bleed,  paroxysmal atrial fibrillation on Eliquis therapy, previous near syncope and syncopal episodes managed by cardiology, and currently on antibiotics for UTI who presents with near syncopal episode, abdominal pain, and continued frequency/dysuria.  According to patient, and reviewed patient's chart, she has had lightheaded and near syncopal episodes in the past.  Patient think she could be dehydrated and think she still has UTI.  She is currently on antibiotics with nitrofurantoin but is unsure if her urine is improving as she still has some dysuria and some frequency.  She reports that today while cooking at the stove, she was getting hot and starting lightheaded.  She went to sit down and sat her head down on the table.  She then had to have a bowel movement and was getting help to the bathroom when she got very lightheaded, nursing well had to lay onto the ground.  She did not fall or hit her head and was not any headache or neck pain.  She reports she is having some abdominal pain in her lower abdomen and right lower quadrant that has been worsened today.  Is unsure if this is similar to what she has had with UTIs in the past.  She reports she is not having flank or back pain at this time but has chronic back pains.  She denies any new trauma.  She denies any nausea, vomiting, constipation, or diarrhea.  She reports that she was lightheaded but did not have a syncopal episode.  She was not feeling chest pain, palpitations, or shortness of breath during the episode.  On exam, lungs clear.  Chest nontender.  Abdomen was tender in the right lower quadrant and suprapubic area.  Flanks are nontender and back nontender.  Patient has dry mucous membranes and appears slightly dehydrated clinically.  Legs are nontender and nonedematous.  Good pulses in extremities.  We had a shared decision-making conversation and agreed to get a CT scan of the abdomen and pelvis to rule out abscess, pyelonephritis, or other  intra-abdominal pathology such as diverticulitis.  We will get repeat urinalysis to make sure she does  not still have continued UTI that would need different antibiotics.  Will give her some fluids as she appears dehydrated and will get the other labs.  I suspect that this is a recurrent near syncopal episode like she has had in the past in the setting of some dehydration and getting overheated on the stove.  Also think that UTI could still be smoldering and could be contributing.  Anticipate reassessment after workup to determine disposition.     Workup returned overall reassuring.  Urinalysis does not show nitrites or bacteria, doubt persistent UTI.  Lactic acid normal x 2 and troponin negative x 2.  Workup overall similar to prior.  Patient did ambulate to the bathroom and back without near syncope or lightheadedness.  Abdominal pain is improved and CT is reassuring.  Chest x-ray reassuring.  Given reassuring workup we feel she is safe for discharge home.  Patient is at these episodes of lightheadedness and near syncope in the past I suspect it was due to her standing over the stove and then going to have a bowel movement.  We feel she is safe for discharge home not improving stability for over 7-1/2 hours.  Patient will follow-up with her primary doctor and her cardiologist.  Patient and family on the phone agree with plan of care and patient was discharged in good condition.         Final Clinical Impression(s) / ED Diagnoses Final diagnoses:  Near syncope  Dehydration    Rx / DC Orders ED Discharge Orders     None      Clinical Impression: 1. Near syncope   2. Dehydration     Disposition: Discharge  Condition: Good  I have discussed the results, Dx and Tx plan with the pt(& family if present). He/she/they expressed understanding and agree(s) with the plan. Discharge instructions discussed at great length. Strict return precautions discussed and pt &/or family have  verbalized understanding of the instructions. No further questions at time of discharge.    New Prescriptions   No medications on file    Follow Up: Merri Brunette, MD [address] [place] [address] [phone]     [facility] Emergency Department at [facility] [address] [id number] mc [place] [address] [phone]       Daimian Sudberry, Canary Brim, MD 10/10/22 2136

## 2022-10-10 NOTE — ED Notes (Signed)
Wheeled pt to the RR

## 2022-10-10 NOTE — ED Triage Notes (Signed)
Pt bib EMS for near syncope while using restaurant. Pt is being treated for UTI. Complains of being lightheaded and dizzy. Alert and oriented x 4

## 2022-10-12 LAB — URINE CULTURE

## 2022-10-14 ENCOUNTER — Other Ambulatory Visit: Payer: Self-pay | Admitting: Cardiology

## 2022-10-14 DIAGNOSIS — I480 Paroxysmal atrial fibrillation: Secondary | ICD-10-CM

## 2022-10-23 ENCOUNTER — Other Ambulatory Visit: Payer: Self-pay | Admitting: Cardiology

## 2022-10-23 DIAGNOSIS — Z4509 Encounter for adjustment and management of other cardiac device: Secondary | ICD-10-CM | POA: Diagnosis not present

## 2022-10-23 DIAGNOSIS — Z95818 Presence of other cardiac implants and grafts: Secondary | ICD-10-CM | POA: Diagnosis not present

## 2022-10-23 DIAGNOSIS — R55 Syncope and collapse: Secondary | ICD-10-CM | POA: Diagnosis not present

## 2022-10-23 DIAGNOSIS — I480 Paroxysmal atrial fibrillation: Secondary | ICD-10-CM

## 2022-10-27 ENCOUNTER — Encounter: Payer: Self-pay | Admitting: Physical Medicine and Rehabilitation

## 2022-11-02 DIAGNOSIS — I10 Essential (primary) hypertension: Secondary | ICD-10-CM | POA: Diagnosis not present

## 2022-11-02 DIAGNOSIS — G4733 Obstructive sleep apnea (adult) (pediatric): Secondary | ICD-10-CM | POA: Diagnosis not present

## 2022-11-02 DIAGNOSIS — I4891 Unspecified atrial fibrillation: Secondary | ICD-10-CM | POA: Diagnosis not present

## 2022-11-02 DIAGNOSIS — J309 Allergic rhinitis, unspecified: Secondary | ICD-10-CM | POA: Diagnosis not present

## 2022-11-02 DIAGNOSIS — E0590 Thyrotoxicosis, unspecified without thyrotoxic crisis or storm: Secondary | ICD-10-CM | POA: Diagnosis not present

## 2022-11-02 DIAGNOSIS — M150 Primary generalized (osteo)arthritis: Secondary | ICD-10-CM | POA: Diagnosis not present

## 2022-11-02 DIAGNOSIS — I890 Lymphedema, not elsewhere classified: Secondary | ICD-10-CM | POA: Diagnosis not present

## 2022-11-02 DIAGNOSIS — F419 Anxiety disorder, unspecified: Secondary | ICD-10-CM | POA: Diagnosis not present

## 2022-11-02 DIAGNOSIS — I7121 Aneurysm of the ascending aorta, without rupture: Secondary | ICD-10-CM | POA: Diagnosis not present

## 2022-11-03 DIAGNOSIS — L570 Actinic keratosis: Secondary | ICD-10-CM | POA: Diagnosis not present

## 2022-11-03 DIAGNOSIS — W57XXXA Bitten or stung by nonvenomous insect and other nonvenomous arthropods, initial encounter: Secondary | ICD-10-CM | POA: Diagnosis not present

## 2022-11-03 DIAGNOSIS — J309 Allergic rhinitis, unspecified: Secondary | ICD-10-CM | POA: Diagnosis not present

## 2022-11-03 DIAGNOSIS — R0982 Postnasal drip: Secondary | ICD-10-CM | POA: Diagnosis not present

## 2022-11-05 DIAGNOSIS — I7121 Aneurysm of the ascending aorta, without rupture: Secondary | ICD-10-CM | POA: Diagnosis not present

## 2022-11-05 DIAGNOSIS — M150 Primary generalized (osteo)arthritis: Secondary | ICD-10-CM | POA: Diagnosis not present

## 2022-11-05 DIAGNOSIS — I890 Lymphedema, not elsewhere classified: Secondary | ICD-10-CM | POA: Diagnosis not present

## 2022-11-05 DIAGNOSIS — I10 Essential (primary) hypertension: Secondary | ICD-10-CM | POA: Diagnosis not present

## 2022-11-09 DIAGNOSIS — M150 Primary generalized (osteo)arthritis: Secondary | ICD-10-CM | POA: Diagnosis not present

## 2022-11-09 DIAGNOSIS — I4891 Unspecified atrial fibrillation: Secondary | ICD-10-CM | POA: Diagnosis not present

## 2022-11-09 DIAGNOSIS — I7121 Aneurysm of the ascending aorta, without rupture: Secondary | ICD-10-CM | POA: Diagnosis not present

## 2022-11-09 DIAGNOSIS — E0590 Thyrotoxicosis, unspecified without thyrotoxic crisis or storm: Secondary | ICD-10-CM | POA: Diagnosis not present

## 2022-11-09 DIAGNOSIS — F419 Anxiety disorder, unspecified: Secondary | ICD-10-CM | POA: Diagnosis not present

## 2022-11-09 DIAGNOSIS — I890 Lymphedema, not elsewhere classified: Secondary | ICD-10-CM | POA: Diagnosis not present

## 2022-11-09 DIAGNOSIS — I10 Essential (primary) hypertension: Secondary | ICD-10-CM | POA: Diagnosis not present

## 2022-11-09 DIAGNOSIS — G4733 Obstructive sleep apnea (adult) (pediatric): Secondary | ICD-10-CM | POA: Diagnosis not present

## 2022-11-09 DIAGNOSIS — J309 Allergic rhinitis, unspecified: Secondary | ICD-10-CM | POA: Diagnosis not present

## 2022-11-12 DIAGNOSIS — N390 Urinary tract infection, site not specified: Secondary | ICD-10-CM | POA: Diagnosis not present

## 2022-11-12 DIAGNOSIS — N3020 Other chronic cystitis without hematuria: Secondary | ICD-10-CM | POA: Diagnosis not present

## 2022-11-12 DIAGNOSIS — N3946 Mixed incontinence: Secondary | ICD-10-CM | POA: Diagnosis not present

## 2022-11-14 DIAGNOSIS — I10 Essential (primary) hypertension: Secondary | ICD-10-CM | POA: Diagnosis not present

## 2022-11-14 DIAGNOSIS — I4891 Unspecified atrial fibrillation: Secondary | ICD-10-CM | POA: Diagnosis not present

## 2022-11-14 DIAGNOSIS — I890 Lymphedema, not elsewhere classified: Secondary | ICD-10-CM | POA: Diagnosis not present

## 2022-11-14 DIAGNOSIS — I7121 Aneurysm of the ascending aorta, without rupture: Secondary | ICD-10-CM | POA: Diagnosis not present

## 2022-11-14 DIAGNOSIS — G4733 Obstructive sleep apnea (adult) (pediatric): Secondary | ICD-10-CM | POA: Diagnosis not present

## 2022-11-14 DIAGNOSIS — J309 Allergic rhinitis, unspecified: Secondary | ICD-10-CM | POA: Diagnosis not present

## 2022-11-14 DIAGNOSIS — F419 Anxiety disorder, unspecified: Secondary | ICD-10-CM | POA: Diagnosis not present

## 2022-11-14 DIAGNOSIS — E0590 Thyrotoxicosis, unspecified without thyrotoxic crisis or storm: Secondary | ICD-10-CM | POA: Diagnosis not present

## 2022-11-14 DIAGNOSIS — M150 Primary generalized (osteo)arthritis: Secondary | ICD-10-CM | POA: Diagnosis not present

## 2022-11-16 DIAGNOSIS — I10 Essential (primary) hypertension: Secondary | ICD-10-CM | POA: Diagnosis not present

## 2022-11-16 DIAGNOSIS — I7121 Aneurysm of the ascending aorta, without rupture: Secondary | ICD-10-CM | POA: Diagnosis not present

## 2022-11-16 DIAGNOSIS — I4891 Unspecified atrial fibrillation: Secondary | ICD-10-CM | POA: Diagnosis not present

## 2022-11-16 DIAGNOSIS — I890 Lymphedema, not elsewhere classified: Secondary | ICD-10-CM | POA: Diagnosis not present

## 2022-11-16 DIAGNOSIS — F419 Anxiety disorder, unspecified: Secondary | ICD-10-CM | POA: Diagnosis not present

## 2022-11-16 DIAGNOSIS — G4733 Obstructive sleep apnea (adult) (pediatric): Secondary | ICD-10-CM | POA: Diagnosis not present

## 2022-11-16 DIAGNOSIS — J309 Allergic rhinitis, unspecified: Secondary | ICD-10-CM | POA: Diagnosis not present

## 2022-11-16 DIAGNOSIS — M150 Primary generalized (osteo)arthritis: Secondary | ICD-10-CM | POA: Diagnosis not present

## 2022-11-16 DIAGNOSIS — E0590 Thyrotoxicosis, unspecified without thyrotoxic crisis or storm: Secondary | ICD-10-CM | POA: Diagnosis not present

## 2022-11-18 DIAGNOSIS — I890 Lymphedema, not elsewhere classified: Secondary | ICD-10-CM | POA: Diagnosis not present

## 2022-11-18 DIAGNOSIS — I4891 Unspecified atrial fibrillation: Secondary | ICD-10-CM | POA: Diagnosis not present

## 2022-11-18 DIAGNOSIS — J309 Allergic rhinitis, unspecified: Secondary | ICD-10-CM | POA: Diagnosis not present

## 2022-11-18 DIAGNOSIS — F419 Anxiety disorder, unspecified: Secondary | ICD-10-CM | POA: Diagnosis not present

## 2022-11-18 DIAGNOSIS — M150 Primary generalized (osteo)arthritis: Secondary | ICD-10-CM | POA: Diagnosis not present

## 2022-11-18 DIAGNOSIS — E0590 Thyrotoxicosis, unspecified without thyrotoxic crisis or storm: Secondary | ICD-10-CM | POA: Diagnosis not present

## 2022-11-18 DIAGNOSIS — I10 Essential (primary) hypertension: Secondary | ICD-10-CM | POA: Diagnosis not present

## 2022-11-18 DIAGNOSIS — I7121 Aneurysm of the ascending aorta, without rupture: Secondary | ICD-10-CM | POA: Diagnosis not present

## 2022-11-18 DIAGNOSIS — G4733 Obstructive sleep apnea (adult) (pediatric): Secondary | ICD-10-CM | POA: Diagnosis not present

## 2022-11-23 ENCOUNTER — Ambulatory Visit (INDEPENDENT_AMBULATORY_CARE_PROVIDER_SITE_OTHER): Payer: Medicare PPO | Admitting: Neurology

## 2022-11-23 ENCOUNTER — Encounter: Payer: Self-pay | Admitting: Neurology

## 2022-11-23 VITALS — BP 133/77 | HR 58 | Ht 65.0 in | Wt 152.0 lb

## 2022-11-23 DIAGNOSIS — R55 Syncope and collapse: Secondary | ICD-10-CM | POA: Diagnosis not present

## 2022-11-23 DIAGNOSIS — R351 Nocturia: Secondary | ICD-10-CM

## 2022-11-23 DIAGNOSIS — Z4509 Encounter for adjustment and management of other cardiac device: Secondary | ICD-10-CM | POA: Diagnosis not present

## 2022-11-23 DIAGNOSIS — G912 (Idiopathic) normal pressure hydrocephalus: Secondary | ICD-10-CM | POA: Diagnosis not present

## 2022-11-23 DIAGNOSIS — R269 Unspecified abnormalities of gait and mobility: Secondary | ICD-10-CM

## 2022-11-23 DIAGNOSIS — Z95818 Presence of other cardiac implants and grafts: Secondary | ICD-10-CM | POA: Diagnosis not present

## 2022-11-23 DIAGNOSIS — R3915 Urgency of urination: Secondary | ICD-10-CM | POA: Diagnosis not present

## 2022-11-23 DIAGNOSIS — F03A0 Unspecified dementia, mild, without behavioral disturbance, psychotic disturbance, mood disturbance, and anxiety: Secondary | ICD-10-CM | POA: Diagnosis not present

## 2022-11-23 MED ORDER — DONEPEZIL HCL 5 MG PO TABS: 5.0000 mg | ORAL_TABLET | Freq: Every day | ORAL | 3 refills | Status: AC

## 2022-11-23 NOTE — Progress Notes (Addendum)
[facility]  PATIENT: Cynthia [name] DOB: December 10, 1940 _________________________________   HISTORICAL  CHIEF COMPLAINT:  Chief Complaint  Patient presents with   Follow-up    Pt in room 10, husband in room.Here for NPH, Polyneuropathy, memory loss. Pt still has lower back pain and left knee pain, uses cane, uses walker home. Pt and husband noticed memory is gradually getting worse. Pt reports she no longer uses cpap machine states pulmonologist manages.  States insurance won't pay for it, reports machine is broken.MMSE:23     HISTORY OF PRESENT ILLNESS:  Cynthia [name] is a 82 y.o. woman with polyneuropathy, sleep apnea and memory loss.   She had a cardiac arrest 11/06/2017  Update 11/23/2022 She notes cognitive issues are mildly worse,   She is forgetful and often misses her noon medication dose.   She scored 22/30 on the MMSE today )mild AD).   She does not remember conversations from earlier in hte day.    She sometimes wakes up with muscle spasms but most nights sleep well.    She has OSA but mask sometimes comes off at night.    She has hypothyroid and takes Armour Thyroid and TSH 04/2021 was 0.95 (normal).   B12 was normal in 2021.      She continues to have poor gait (shuffling) and bladder urgency with some urge incontinence.     She walks poorly an it has slowly worsened over time.   She uses a walker and could go 1/4 mile without a stop.   Gait is limited by probable NPH as well as hip pain.   She had a high volume LP 03/04/2020 (35 cc removed).   She and her husband felt that the gait was better for a few days afterwards.  Unfortunately, she was having mental status changes related to oxycodone when she had her neurosurgery appointment.  Therefore, decision was made to wait till the cognitive changes resolved.   She has not wanted to repeat the LP and get another opinion.     She has bladder urge incontinence.    Desmopressin helps the frequency at  night.  Headaches have been better.  Neck pain is doing better as well.   Neuropathy pain in the feet has improved with alpha lipoic acid OTC.  She is on Eliquis for AFib.  Her daughter has Ehlers-Danlos  I reviewed the 03/20/2020 CT scan.  It was unchanged compared to 01/24/2020 and shows ventriculomegaly and microvascular ischemic changes.  She has OSA and uses CPAP every night     AHI on the d/l was excellent AT 3.3.   She sees pulmonology     11/23/2022   10:52 AM 10/06/2021   10:02 AM 12/13/2019    3:00 PM  MMSE - Mini Mental State Exam  Orientation to time 3 3 3   Orientation to Place 5 5 5   Registration 3 3 3   Attention/ Calculation 1 5 3   Recall 2 2 2   Language- name 2 objects 2 2 2   Language- repeat 1 1 1   Language- follow 3 step command 3 2 3   Language- read & follow direction 1 1 1   Write a sentence 1 1 1   Copy design 1 1 1   Total score 23 26 25        REVIEW OF SYSTEMS: Constitutional: No fevers, chills, sweats, or change in appetite Eyes: No visual changes, double vision, eye pain Ear, nose and throat: Chronic sinusutis.  Bilat hearing loss, worse on left  Cardiovascular: No chest pain, palpitations Respiratory:  No shortness of breath at rest or with exertion.   No wheezes.  Has OSA GastrointestinaI: No nausea, vomiting, diarrhea, abdominal pain, fecal incontinence.   Occ dysphagia Genitourinary:  Frequent UTIs.  SOme frequency. Musculoskeletal:  as above Integumentary: No rash, pruritus, skin lesions Neurological: as above Psychiatric: No depression at this time.  No anxiety Endocrine: No palpitations, diaphoresis, change in appetite.  Increased thirst Hematologic/Lymphatic:  No anemia, purpura, petechiae. Allergic/Immunologic: No itchy/runny eyes, recent allergic reactions, rashes  ALLERGIES: Allergies  Allergen Reactions   Flecainide Other (See Comments)    Severe dizziness   Oxycodone Other (See Comments)    uphoria   Trimethoprim Other (See  Comments)    Other reaction(s): hives   Alendronate Sodium Nausea Only   Avelox [Moxifloxacin Hcl In Nacl] Other (See Comments)    Unknown reaction   Ciprofloxacin Other (See Comments)    Other reaction(s): multiple medication interactions   Other Other (See Comments)    Avelox+cymbalta Together Other reaction(s): flu like symptoms    HOME MEDICATIONS:  Current Outpatient Medications:    acetaminophen (TYLENOL) 500 MG tablet, Take 1,000 mg by mouth at bedtime., Disp: , Rfl:    Alpha-Lipoic Acid (LIPOIC ACID PO), Take 600 mg by mouth daily., Disp: , Rfl:    amiodarone (PACERONE) 200 MG tablet, Take 1 tablet (200 mg total) by mouth daily., Disp: 90 tablet, Rfl: 3   apixaban (ELIQUIS) 5 MG TABS tablet, Take 1 tablet (5 mg total) by mouth 2 (two) times daily., Disp: 60 tablet, Rfl: 6   atorvastatin (LIPITOR) 10 MG tablet, Take 1 tablet (10 mg total) by mouth daily., Disp: 90 tablet, Rfl: 3   AZO-CRANBERRY PO, Take 1 tablet by mouth daily., Disp: , Rfl:    Calcium Carbonate-Vitamin D3 600-400 MG-UNIT TABS, Take 1 tablet by mouth daily., Disp: , Rfl:    cephALEXin (KEFLEX) 250 MG capsule, Take 250 mg by mouth 4 (four) times daily., Disp: , Rfl:    denosumab (PROLIA) 60 MG/ML SOSY injection, Inject 60 mg into the skin every 6 (six) months., Disp: , Rfl:    desmopressin (DDAVP) 0.2 MG tablet, Take 3 tablets by mouth as needed (Uriniary tract)., Disp: , Rfl:    diltiazem (CARDIZEM CD) 180 MG 24 hr capsule, TAKE 1 CAPSULE BY MOUTH EVERY DAY, Disp: 90 capsule, Rfl: 1   donepezil (ARICEPT) 5 MG tablet, Take 1 tablet (5 mg total) by mouth at bedtime., Disp: 90 tablet, Rfl: 3   fluticasone (FLONASE) 50 MCG/ACT nasal spray, Place 1 spray into the nose at bedtime., Disp: , Rfl:    losartan (COZAAR) 100 MG tablet, Take 50 mg by mouth every evening., Disp: , Rfl:    meclizine (ANTIVERT) 25 MG tablet, Take 25 mg by mouth daily as needed for dizziness., Disp: , Rfl:    Melatonin 5 MG CAPS, Take 10 mg by  mouth at bedtime., Disp: , Rfl:    Multiple Vitamin (MULTIVITAMIN WITH MINERALS) TABS tablet, Take 1-2 tablets by mouth daily. flintstone vitamins Chewable with Iron, Disp: , Rfl:    multivitamin-iron-minerals-folic acid (CENTRUM) chewable tablet, Chew 1 tablet by mouth daily., Disp: , Rfl:    nitroGLYCERIN (NITROLINGUAL) 0.4 MG/SPRAY spray, Place 1 spray under the tongue every 5 (five) minutes x 3 doses as needed for chest pain., Disp: 12 g, Rfl: 1   Omega-3 1000 MG CAPS, Take 2,000 mg by mouth daily. , Disp: , Rfl:    ondansetron (ZOFRAN) 4  MG tablet, Take 4 mg by mouth every 8 (eight) hours as needed for nausea or vomiting., Disp: , Rfl:    pantoprazole (PROTONIX) 40 MG tablet, Take 40 mg by mouth daily before breakfast. , Disp: , Rfl: 1   Pediatric Multiple Vitamins (FLINTSTONES MULTIVITAMIN) CHEW, Chew 1 tablet by mouth daily., Disp: , Rfl:    Pediatric Multivit-Minerals (FLINTSTONES GUMMIES COMPLETE PO), Take by mouth., Disp: , Rfl:    thyroid (ARMOUR) 60 MG tablet, Take 60 mg by mouth daily before breakfast., Disp: , Rfl:    vitamin C (ASCORBIC ACID) 500 MG tablet, Take 500 mg by mouth daily., Disp: , Rfl:   PAST MEDICAL HISTORY: Past Medical History:  Diagnosis Date   Acute GI bleeding 10/27/2017   Arthritis    "joints" (11/10/2017)   Dizziness    Dysrhythmia    afib   Encounter for loop recorder check 06/09/2020   GERD (gastroesophageal reflux disease)    GI bleed 11/06/2017   Hearing loss    History of blood transfusion 10/2017   Hyperlipidemia    Hypertension    Hyperthyroidism    Loop recorder Biotronik Loop Recorder 05/23/2020 05/23/2020   Melanoma (HCC)    "cut off my back"   Memory loss    Pneumonia    Sleep apnea    cpap   Syncope and collapse     PAST SURGICAL HISTORY: Past Surgical History:  Procedure Laterality Date   ABDOMINAL HYSTERECTOMY     "partial"   APPENDECTOMY     BIOPSY  11/17/2017   Procedure: BIOPSY;  Surgeon: Vida Rigger, MD;  Location: [facility];  Service: Endoscopy;;   COLONOSCOPY WITH PROPOFOL N/A 11/17/2017   Procedure: COLONOSCOPY WITH PROPOFOL;  Surgeon: Vida Rigger, MD;  Location: [facility];  Service: Endoscopy;  Laterality: N/A;   CRANIECTOMY FOR EXCISION OF ACOUSTIC NEUROMA     ESOPHAGOGASTRODUODENOSCOPY (EGD) WITH PROPOFOL N/A 10/28/2017   Procedure: ESOPHAGOGASTRODUODENOSCOPY (EGD) WITH PROPOFOL;  Surgeon: Vida Rigger, MD;  Location: [facility];  Service: Endoscopy;  Laterality: N/A;   FOOT SURGERY Bilateral 04/18/2020   KNEE ARTHROPLASTY Right 06/05/2021   Procedure: COMPUTER ASSISTED TOTAL KNEE ARTHROPLASTY;  Surgeon: Samson Frederic, MD;  Location: [facility];  Service: Orthopedics;  Laterality: Right;   KNEE ARTHROSCOPY Left    LEFT HEART CATH AND CORONARY ANGIOGRAPHY N/A 11/24/2016   Procedure: LEFT HEART CATH AND CORONARY ANGIOGRAPHY;  Surgeon: Corky Crafts, MD;  Location: [facility] INVASIVE CV LAB;  Service: Cardiovascular;  Laterality: N/A;   MELANOMA EXCISION     "off my back"   NASAL SINUS SURGERY     TONSILLECTOMY      FAMILY HISTORY: Family History  Problem Relation Age of Onset   Congestive Heart Failure Mother    Heart disease Father        History of heart attacks at a later age.     Heart disease Brother    Hearing loss Brother     SOCIAL HISTORY:  Social History   Socioeconomic History   Marital status: Married    Spouse name: Not on file   Number of children: 2   Years of education: Not on file   Highest education level: Not on file  Occupational History   Not on file  Tobacco Use   Smoking status: Former    Current packs/day: 0.00    Types: Cigarettes    Start date: 8    Quit date: 1980    Years since quitting: 47.6  Smokeless tobacco: Never   Tobacco comments:    11/10/2017 "only smoked when I was on my period"  Vaping Use   Vaping status: Never Used  Substance and Sexual Activity   Alcohol use: Yes    Alcohol/week: 1.0 standard drink of alcohol    Types: 1  Glasses of wine per week    Comment: wine on occassionally   Drug use: Never   Sexual activity: Not Currently  Other Topics Concern   Not on file  Social History Narrative   Right handed    Social Determinants of Health   Financial Resource Strain: Not on file  Food Insecurity: Not on file  Transportation Needs: Not on file  Physical Activity: Not on file  Stress: Not on file  Social Connections: Unknown (08/16/2021)   Received from [facility], [facility]   Social Network    Social Network: Not on file  Intimate Partner Violence: Unknown (07/18/2021)   Received from [facility], [facility]   HITS    Physically Hurt: Not on file    Insult or Talk Down To: Not on file    Threaten Physical Harm: Not on file    Scream or Curse: Not on file     PHYSICAL EXAM  Vitals:   11/23/22 1046  BP: 133/77  Pulse: (!) 58  Weight: 152 lb (68.9 kg)  Height: 5\' 5"  (1.651 m)     Body mass index is 25.29 kg/m.   General: The patient is well-developed and well-nourished and in no acute distress  Neck:   She notes no neck tenderness at this time.  Range of motion is good.  Skin: Extremities are without significant edema, rash.     Neurologic Exam  Mental status: The patient is alert and oriented x 2 (wrong date) at the time of the examination.  See MMSE scores above for details.Marland Kitchen   Speech is normal.  Cranial nerves: Extraocular movements are full.   Facial strength and sensation is normal.  Trapezius strength is normal.   Reduced hearing on the left.   Motor:  Muscle bulk is normal.   Tone is normal. Strength is  5 / 5 in all 4 extremities.   Sensory: Sensory testing is intact to soft touch in the arms but reduced sensation to vibration in the toes.  Fairly normal sensation at the ankles.  Coordination: Cerebellar testing shows good finger-nose-finger and heel-to-shin   Gait and station: Station is normal.   Her gait is wide and better with cane.  Some shuffling walking  in room with a 7 step 180 degree turn.  Cannot tandem  Reflexes: Symmetric in arms and legs.    ASSESSMENT AND PLAN  Mild dementia without behavioral disturbance, psychotic disturbance, mood disturbance, or anxiety, unspecified dementia type (HCC) - Plan: ATN PROFILE  NPH (normal pressure hydrocephalus) (HCC)  Urinary urgency  Nocturia  Gait disturbance   1.   She has ventriculomegaly and a high-volume lumbar puncture had helped her gait for a few days.  Since she feels that she has stabilized over the last year, she is not interested in pursuing a VP shunt at this time.  However, if gait worsens, we  will repeat CT and repeat high volume LP and have her see NSU if benefit is noted.    2.   Advised to get back on APAP nightly - sees Pulmonary.   She has some sleepiness. Her machine is not working and they are having insurance issues. 3  continue alpha lipoic acid 600-800 mg daily for neuropathy 4.  Patient has mild dementia (MMSE equals 22).  She takes Educational psychologist.  We will start donepezil.  Will check ATN profile to see if AD or other etiology.   Try to stay mentally active.      rtc 6 months for scheduled visit if stable.     This visit is part of a comprehensive longitudinal care medical relationship regarding the patients primary diagnosis of dementia and related concerns.   A. Epimenio Foot, MD, PhD 11/23/2022, 11:44 AM Certified in Neurology, Clinical Neurophysiology, Sleep Medicine, Pain Medicine and Neuroimaging  [facility] [address] [place], [place] [address] [phone]

## 2022-11-25 DIAGNOSIS — I4891 Unspecified atrial fibrillation: Secondary | ICD-10-CM | POA: Diagnosis not present

## 2022-11-25 DIAGNOSIS — J309 Allergic rhinitis, unspecified: Secondary | ICD-10-CM | POA: Diagnosis not present

## 2022-11-25 DIAGNOSIS — E0590 Thyrotoxicosis, unspecified without thyrotoxic crisis or storm: Secondary | ICD-10-CM | POA: Diagnosis not present

## 2022-11-25 DIAGNOSIS — I10 Essential (primary) hypertension: Secondary | ICD-10-CM | POA: Diagnosis not present

## 2022-11-25 DIAGNOSIS — G4733 Obstructive sleep apnea (adult) (pediatric): Secondary | ICD-10-CM | POA: Diagnosis not present

## 2022-11-25 DIAGNOSIS — M150 Primary generalized (osteo)arthritis: Secondary | ICD-10-CM | POA: Diagnosis not present

## 2022-11-25 DIAGNOSIS — I890 Lymphedema, not elsewhere classified: Secondary | ICD-10-CM | POA: Diagnosis not present

## 2022-11-25 DIAGNOSIS — F419 Anxiety disorder, unspecified: Secondary | ICD-10-CM | POA: Diagnosis not present

## 2022-11-25 DIAGNOSIS — I7121 Aneurysm of the ascending aorta, without rupture: Secondary | ICD-10-CM | POA: Diagnosis not present

## 2022-11-30 ENCOUNTER — Encounter: Payer: Medicare PPO | Admitting: Physical Medicine and Rehabilitation

## 2022-11-30 DIAGNOSIS — N3020 Other chronic cystitis without hematuria: Secondary | ICD-10-CM | POA: Diagnosis not present

## 2022-11-30 DIAGNOSIS — N3000 Acute cystitis without hematuria: Secondary | ICD-10-CM | POA: Diagnosis not present

## 2022-11-30 DIAGNOSIS — R8271 Bacteriuria: Secondary | ICD-10-CM | POA: Diagnosis not present

## 2022-12-01 DIAGNOSIS — G4733 Obstructive sleep apnea (adult) (pediatric): Secondary | ICD-10-CM | POA: Diagnosis not present

## 2022-12-01 DIAGNOSIS — M150 Primary generalized (osteo)arthritis: Secondary | ICD-10-CM | POA: Diagnosis not present

## 2022-12-01 DIAGNOSIS — J309 Allergic rhinitis, unspecified: Secondary | ICD-10-CM | POA: Diagnosis not present

## 2022-12-01 DIAGNOSIS — I890 Lymphedema, not elsewhere classified: Secondary | ICD-10-CM | POA: Diagnosis not present

## 2022-12-01 DIAGNOSIS — I4891 Unspecified atrial fibrillation: Secondary | ICD-10-CM | POA: Diagnosis not present

## 2022-12-01 DIAGNOSIS — I10 Essential (primary) hypertension: Secondary | ICD-10-CM | POA: Diagnosis not present

## 2022-12-01 DIAGNOSIS — I7121 Aneurysm of the ascending aorta, without rupture: Secondary | ICD-10-CM | POA: Diagnosis not present

## 2022-12-01 DIAGNOSIS — F419 Anxiety disorder, unspecified: Secondary | ICD-10-CM | POA: Diagnosis not present

## 2022-12-01 DIAGNOSIS — E0590 Thyrotoxicosis, unspecified without thyrotoxic crisis or storm: Secondary | ICD-10-CM | POA: Diagnosis not present

## 2022-12-09 DIAGNOSIS — R350 Frequency of micturition: Secondary | ICD-10-CM | POA: Diagnosis not present

## 2022-12-09 DIAGNOSIS — N3020 Other chronic cystitis without hematuria: Secondary | ICD-10-CM | POA: Diagnosis not present

## 2022-12-09 DIAGNOSIS — N3946 Mixed incontinence: Secondary | ICD-10-CM | POA: Diagnosis not present

## 2022-12-10 DIAGNOSIS — I4891 Unspecified atrial fibrillation: Secondary | ICD-10-CM | POA: Diagnosis not present

## 2022-12-10 DIAGNOSIS — I10 Essential (primary) hypertension: Secondary | ICD-10-CM | POA: Diagnosis not present

## 2022-12-10 DIAGNOSIS — I7121 Aneurysm of the ascending aorta, without rupture: Secondary | ICD-10-CM | POA: Diagnosis not present

## 2022-12-10 DIAGNOSIS — I890 Lymphedema, not elsewhere classified: Secondary | ICD-10-CM | POA: Diagnosis not present

## 2022-12-10 DIAGNOSIS — E0590 Thyrotoxicosis, unspecified without thyrotoxic crisis or storm: Secondary | ICD-10-CM | POA: Diagnosis not present

## 2022-12-10 DIAGNOSIS — J309 Allergic rhinitis, unspecified: Secondary | ICD-10-CM | POA: Diagnosis not present

## 2022-12-10 DIAGNOSIS — M150 Primary generalized (osteo)arthritis: Secondary | ICD-10-CM | POA: Diagnosis not present

## 2022-12-10 DIAGNOSIS — G4733 Obstructive sleep apnea (adult) (pediatric): Secondary | ICD-10-CM | POA: Diagnosis not present

## 2022-12-10 DIAGNOSIS — F419 Anxiety disorder, unspecified: Secondary | ICD-10-CM | POA: Diagnosis not present

## 2022-12-16 DIAGNOSIS — N3020 Other chronic cystitis without hematuria: Secondary | ICD-10-CM | POA: Diagnosis not present

## 2022-12-16 DIAGNOSIS — N3946 Mixed incontinence: Secondary | ICD-10-CM | POA: Diagnosis not present

## 2022-12-17 DIAGNOSIS — I4891 Unspecified atrial fibrillation: Secondary | ICD-10-CM | POA: Diagnosis not present

## 2022-12-17 DIAGNOSIS — I10 Essential (primary) hypertension: Secondary | ICD-10-CM | POA: Diagnosis not present

## 2022-12-17 DIAGNOSIS — G4733 Obstructive sleep apnea (adult) (pediatric): Secondary | ICD-10-CM | POA: Diagnosis not present

## 2022-12-17 DIAGNOSIS — E0590 Thyrotoxicosis, unspecified without thyrotoxic crisis or storm: Secondary | ICD-10-CM | POA: Diagnosis not present

## 2022-12-17 DIAGNOSIS — I890 Lymphedema, not elsewhere classified: Secondary | ICD-10-CM | POA: Diagnosis not present

## 2022-12-17 DIAGNOSIS — M150 Primary generalized (osteo)arthritis: Secondary | ICD-10-CM | POA: Diagnosis not present

## 2022-12-17 DIAGNOSIS — I7121 Aneurysm of the ascending aorta, without rupture: Secondary | ICD-10-CM | POA: Diagnosis not present

## 2022-12-17 DIAGNOSIS — J309 Allergic rhinitis, unspecified: Secondary | ICD-10-CM | POA: Diagnosis not present

## 2022-12-17 DIAGNOSIS — F419 Anxiety disorder, unspecified: Secondary | ICD-10-CM | POA: Diagnosis not present

## 2022-12-22 DIAGNOSIS — E0590 Thyrotoxicosis, unspecified without thyrotoxic crisis or storm: Secondary | ICD-10-CM | POA: Diagnosis not present

## 2022-12-22 DIAGNOSIS — G4733 Obstructive sleep apnea (adult) (pediatric): Secondary | ICD-10-CM | POA: Diagnosis not present

## 2022-12-22 DIAGNOSIS — J309 Allergic rhinitis, unspecified: Secondary | ICD-10-CM | POA: Diagnosis not present

## 2022-12-22 DIAGNOSIS — M150 Primary generalized (osteo)arthritis: Secondary | ICD-10-CM | POA: Diagnosis not present

## 2022-12-22 DIAGNOSIS — I4891 Unspecified atrial fibrillation: Secondary | ICD-10-CM | POA: Diagnosis not present

## 2022-12-22 DIAGNOSIS — I10 Essential (primary) hypertension: Secondary | ICD-10-CM | POA: Diagnosis not present

## 2022-12-22 DIAGNOSIS — F419 Anxiety disorder, unspecified: Secondary | ICD-10-CM | POA: Diagnosis not present

## 2022-12-22 DIAGNOSIS — I7121 Aneurysm of the ascending aorta, without rupture: Secondary | ICD-10-CM | POA: Diagnosis not present

## 2022-12-22 DIAGNOSIS — I890 Lymphedema, not elsewhere classified: Secondary | ICD-10-CM | POA: Diagnosis not present

## 2022-12-24 DIAGNOSIS — R55 Syncope and collapse: Secondary | ICD-10-CM | POA: Diagnosis not present

## 2022-12-24 DIAGNOSIS — Z4509 Encounter for adjustment and management of other cardiac device: Secondary | ICD-10-CM | POA: Diagnosis not present

## 2022-12-24 DIAGNOSIS — Z95818 Presence of other cardiac implants and grafts: Secondary | ICD-10-CM | POA: Diagnosis not present

## 2023-01-05 ENCOUNTER — Encounter: Payer: Medicare PPO | Attending: Physical Medicine and Rehabilitation | Admitting: Physical Medicine and Rehabilitation

## 2023-01-07 ENCOUNTER — Encounter: Payer: Self-pay | Admitting: Cardiology

## 2023-01-19 DIAGNOSIS — R7303 Prediabetes: Secondary | ICD-10-CM | POA: Diagnosis not present

## 2023-01-19 DIAGNOSIS — E7800 Pure hypercholesterolemia, unspecified: Secondary | ICD-10-CM | POA: Diagnosis not present

## 2023-01-20 LAB — LAB REPORT - SCANNED
A1c: 5.7
EGFR: 69

## 2023-01-21 ENCOUNTER — Ambulatory Visit: Payer: Medicare PPO

## 2023-01-21 DIAGNOSIS — R55 Syncope and collapse: Secondary | ICD-10-CM

## 2023-01-21 LAB — CUP PACEART REMOTE DEVICE CHECK
Date Time Interrogation Session: 20241010071449
Implantable Pulse Generator Implant Date: 20220210
Pulse Gen Model: 436066
Pulse Gen Serial Number: 94061742

## 2023-01-22 ENCOUNTER — Ambulatory Visit: Payer: Medicare PPO | Admitting: Cardiology

## 2023-01-25 DIAGNOSIS — R7303 Prediabetes: Secondary | ICD-10-CM | POA: Diagnosis not present

## 2023-01-25 DIAGNOSIS — L308 Other specified dermatitis: Secondary | ICD-10-CM | POA: Diagnosis not present

## 2023-01-25 DIAGNOSIS — Z23 Encounter for immunization: Secondary | ICD-10-CM | POA: Diagnosis not present

## 2023-01-25 DIAGNOSIS — I10 Essential (primary) hypertension: Secondary | ICD-10-CM | POA: Diagnosis not present

## 2023-01-25 DIAGNOSIS — I480 Paroxysmal atrial fibrillation: Secondary | ICD-10-CM | POA: Diagnosis not present

## 2023-01-25 DIAGNOSIS — E039 Hypothyroidism, unspecified: Secondary | ICD-10-CM | POA: Diagnosis not present

## 2023-01-25 DIAGNOSIS — E782 Mixed hyperlipidemia: Secondary | ICD-10-CM | POA: Diagnosis not present

## 2023-01-25 DIAGNOSIS — M810 Age-related osteoporosis without current pathological fracture: Secondary | ICD-10-CM | POA: Diagnosis not present

## 2023-01-28 DIAGNOSIS — M810 Age-related osteoporosis without current pathological fracture: Secondary | ICD-10-CM | POA: Diagnosis not present

## 2023-02-04 NOTE — Progress Notes (Signed)
Carelink Summary Report / Loop Recorder 

## 2023-02-13 ENCOUNTER — Other Ambulatory Visit: Payer: Self-pay | Admitting: Cardiology

## 2023-02-13 DIAGNOSIS — I480 Paroxysmal atrial fibrillation: Secondary | ICD-10-CM

## 2023-02-15 ENCOUNTER — Encounter: Payer: Medicare PPO | Attending: Physical Medicine and Rehabilitation | Admitting: Physical Medicine and Rehabilitation

## 2023-02-15 ENCOUNTER — Other Ambulatory Visit: Payer: Self-pay

## 2023-02-15 ENCOUNTER — Encounter: Payer: Self-pay | Admitting: Physical Medicine and Rehabilitation

## 2023-02-15 VITALS — BP 100/63 | HR 68 | Ht 65.0 in | Wt 151.0 lb

## 2023-02-15 DIAGNOSIS — G629 Polyneuropathy, unspecified: Secondary | ICD-10-CM | POA: Insufficient documentation

## 2023-02-15 DIAGNOSIS — G8929 Other chronic pain: Secondary | ICD-10-CM | POA: Insufficient documentation

## 2023-02-15 DIAGNOSIS — M25551 Pain in right hip: Secondary | ICD-10-CM | POA: Diagnosis not present

## 2023-02-15 DIAGNOSIS — M25562 Pain in left knee: Secondary | ICD-10-CM | POA: Diagnosis not present

## 2023-02-15 DIAGNOSIS — I480 Paroxysmal atrial fibrillation: Secondary | ICD-10-CM

## 2023-02-15 MED ORDER — APIXABAN 5 MG PO TABS: 5.0000 mg | ORAL_TABLET | Freq: Two times a day (BID) | ORAL | 5 refills | Status: DC

## 2023-02-15 NOTE — Telephone Encounter (Signed)
Prescription refill request for Eliquis received. Indication: Afib  Last office visit: 09/18/22 Odis Hollingshead)  Scr: 0.85 (01/19/23)  Age: 82 Weight: 68.5kg  Appropriate dose. Refill sent.

## 2023-02-15 NOTE — Patient Instructions (Addendum)
Red light therapy [place], [place]  -Discussed following foods that may reduce pain: 1) Ginger (especially studied for arthritis)- reduce leukotriene production to decrease inflammation 2) Blueberries- high in phytonutrients that decrease inflammation 3) Salmon- marine omega-3s reduce joint swelling and pain 4) Pumpkin seeds- reduce inflammation 5) dark chocolate- reduces inflammation 6) turmeric- reduces inflammation 7) tart cherries - reduce pain and stiffness 8) extra virgin olive oil - its compound olecanthal helps to block prostaglandins  9) chili peppers- can be eaten or applied topically via capsaicin 10) mint- helpful for headache, muscle aches, joint pain, and itching 11) garlic- reduces inflammation  Link to further information on diet for chronic pain: http://www.bray.[email or web address]

## 2023-02-15 NOTE — Progress Notes (Addendum)
Subjective:    [name], female    DOB: [birth year], 82 y.o.   MRN: [id number]  HPI Cynthia [name] is an 82 year old woman who presents to establish care for right little toe, right hip, and left knee pain.  1) Right hip pain: -has been present for a long time -when her pain is severe she uses 2 tylenol and tramadol -it discourages her from getting up and walking around -she did PT a long time ago -until fairly recently she was in a weekly exercise group  2) Left knee pain: -started bothering her about 3 months ago -she feels that has contributed to declining mobility -uses walker when going to appointments  Pain Inventory Average Pain 7 Pain Right Now 5 My pain is dull and aching  In the last 24 hours, has pain interfered with the following? General activity 8 Relation with others 0 Enjoyment of life 7 What TIME of day is your pain at its worst? varies Sleep (in general) Fair  Pain is worse with: walking, bending, sitting, standing, and some activites Pain improves with: rest, heat/ice, and medication Relief from Meds: 5  use a cane use a walker ability to climb steps?  yes do you drive?  no use a wheelchair  retired I need assistance with the following:  meal prep, household duties, and shopping  trouble walking dizziness  Any changes since last visit?  no  Any changes since last visit?  no    Family History  Problem Relation Age of Onset   Congestive Heart Failure Mother    Heart disease Father        History of heart attacks at a later age.     Heart disease Brother    Hearing loss Brother    Social History   Socioeconomic History   Marital status: Married    Spouse name: Not on file   Number of children: 2   Years of education: Not on file   Highest education level: Not on file  Occupational History   Not on file  Tobacco Use   Smoking status: Former    Current packs/day: 0.00    Types: Cigarettes    Start date: 68     Quit date: 1980    Years since quitting: 44.8   Smokeless tobacco: Never   Tobacco comments:    11/10/2017 "only smoked when I was on my period"  Vaping Use   Vaping status: Never Used  Substance and Sexual Activity   Alcohol use: Yes    Alcohol/week: 1.0 standard drink of alcohol    Types: 1 Glasses of wine per week    Comment: wine on occassionally   Drug use: Never   Sexual activity: Not Currently  Other Topics Concern   Not on file  Social History Narrative   Right handed    Social Determinants of Health   Financial Resource Strain: Not on file  Food Insecurity: Not on file  Transportation Needs: Not on file  Physical Activity: Not on file  Stress: Not on file  Social Connections: Unknown (08/16/2021)   Received [facility], [facility]   Social Network    Social Network: Not on file   Past Surgical History:  Procedure Laterality Date   ABDOMINAL HYSTERECTOMY     "partial"   APPENDECTOMY     BIOPSY  11/17/2017   Procedure: BIOPSY;  Surgeon: Vida Rigger, MD;  Location: [facility];  Service: Endoscopy;;  COLONOSCOPY WITH PROPOFOL N/A 11/17/2017   Procedure: COLONOSCOPY WITH PROPOFOL;  Surgeon: Vida Rigger, MD;  Location: [facility];  Service: Endoscopy;  Laterality: N/A;   CRANIECTOMY FOR EXCISION OF ACOUSTIC NEUROMA     ESOPHAGOGASTRODUODENOSCOPY (EGD) WITH PROPOFOL N/A 10/28/2017   Procedure: ESOPHAGOGASTRODUODENOSCOPY (EGD) WITH PROPOFOL;  Surgeon: Vida Rigger, MD;  Location: [facility];  Service: Endoscopy;  Laterality: N/A;   FOOT SURGERY Bilateral 04/18/2020   KNEE ARTHROPLASTY Right 06/05/2021   Procedure: COMPUTER ASSISTED TOTAL KNEE ARTHROPLASTY;  Surgeon: Samson Frederic, MD;  Location: [facility];  Service: Orthopedics;  Laterality: Right;   KNEE ARTHROSCOPY Left    LEFT HEART CATH AND CORONARY ANGIOGRAPHY N/A 11/24/2016   Procedure: LEFT HEART CATH AND CORONARY ANGIOGRAPHY;  Surgeon: Corky Crafts, MD;  Location: [facility] INVASIVE CV LAB;  Service:  Cardiovascular;  Laterality: N/A;   MELANOMA EXCISION     "off my back"   NASAL SINUS SURGERY     TONSILLECTOMY     Past Medical History:  Diagnosis Date   Acute GI bleeding 10/27/2017   Arthritis    "joints" (11/10/2017)   Dizziness    Dysrhythmia    afib   Encounter for loop recorder check 06/09/2020   GERD (gastroesophageal reflux disease)    GI bleed 11/06/2017   Hearing loss    History of blood transfusion 10/2017   Hyperlipidemia    Hypertension    Hyperthyroidism    Loop recorder Biotronik Loop Recorder 05/23/2020 05/23/2020   Melanoma (HCC)    "cut off my back"   Memory loss    Pneumonia    Sleep apnea    cpap   Syncope and collapse    Ht 5\' 5"  (1.651 m)   Wt 151 lb (68.5 kg)   BMI 25.13 kg/m   Opioid Risk Score:   Fall Risk Score:  `1  Depression screen [facility] 2/9     01/03/2016   11:20 AM 01/03/2016   11:19 AM 08/14/2015   11:45 AM 08/14/2015   11:44 AM 07/03/2015   10:35 AM 05/27/2015    1:38 PM 05/13/2015   11:37 AM  Depression screen PHQ 2/9  Decreased Interest 0 0 0 0 0 0 0  Down, Depressed, Hopeless 0 0 0 0 0 0 0  PHQ - 2 Score 0 0 0 0 0 0 0      Review of Systems  Musculoskeletal:  Positive for back pain and gait problem.  All other systems reviewed and are negative.     Objective:   Physical Exam Gen: no distress, normal appearing HEENT: oral mucosa pink and moist, NCAT Cardio: Reg rate Chest: normal effort, normal rate of breathing Abd: soft, non-distended Ext: no edema Psych: pleasant, normal affect Skin: intact Neuro: Alert and oriented x3, TTP right buttock and over trochanteric bursitis, TTP in left posterior fossa of knee       Assessment & Plan:   1) Right hip pain:  -discussed that it seems to be secondary to bursitis -referred to physical therapy for strengthening of glut med and min -continue tylenol and tramadol prn -Discussed current symptoms of pain and history of pain.  -Discussed benefits of exercise in reducing  pain. -Discussed following foods that may reduce pain: 1) Ginger (especially studied for arthritis)- reduce leukotriene production to decrease inflammation 2) Blueberries- high in phytonutrients that decrease inflammation 3) Salmon- marine omega-3s reduce joint swelling and pain 4) Pumpkin seeds- reduce inflammation 5) dark chocolate- reduces inflammation 6) turmeric- reduces inflammation 7)  tart cherries - reduce pain and stiffness 8) extra virgin olive oil - its compound olecanthal helps to block prostaglandins  9) chili peppers- can be eaten or applied topically via capsaicin 10) mint- helpful for headache, muscle aches, joint pain, and itching 11) garlic- reduces inflammation  Link to further information on diet for chronic pain: http://www.bray.com/   2) Right buttock pain/sciatica/neuropathy: -Discussed Qutenza as an option for neuropathic pain control. Discussed that this is a capsaicin patch, stronger than capsaicin cream. Discussed that it is currently approved for diabetic peripheral neuropathy and post-herpetic neuralgia, but that it has also shown benefit in treating other forms of neuropathy. Provided patient with link to site to learn more about the patch: https://www.clark.biz/. Discussed that the patch would be placed in office and benefits usually last 3 months. Discussed that unintended exposure to capsaicin can cause severe irritation of eyes, mucous membranes, respiratory tract, and skin, but that Qutenza is a local treatment and does not have the systemic side effects of other nerve medications. Discussed that there may be pain, itching, erythema, and decreased sensory function associated with the application of Qutenza. Side effects usually subside within 1 week. A cold pack of analgesic medications can help with these side effects. Blood pressure can also be increased due to pain associated with  administration of the patch.   Prescribing Home Zynex NexWave Stimulator Device and supplies as needed. IFC, NMES and TENS medically necessary Treatment Rx: Daily @ 30-40 minutes per treatment PRN. Zynex NexWave only, no substitutions. Treatment Goals: 1) To reduce and/or eliminate pain 2) To improve functional capacity and Activities of daily living 3) To reduce or prevent the need for oral medications 4) To improve circulation in the injured region 5) To decrease or prevent muscle spasm and muscle atrophy 6) To provide a self-management tool to the patient The patient has not sufficiently improved with conservative care. Numerous studies indexed by Medline and PubMed.gov have shown Neuromuscular, Interferential, and TENS stimulators to reduce pain, improve function, and reduce medication use in injured patients. Continued use of this evidence based, safe, drug free treatment is both reasonable and medically necessary at this time.   3) Left knee pain -discussed present in posterior fossa and can be due to gait impairment stemming from her right hip/buttock pain -recommended daily turmeric with black pepper

## 2023-02-22 ENCOUNTER — Telehealth: Payer: Self-pay | Admitting: Cardiology

## 2023-02-22 NOTE — Telephone Encounter (Signed)
Patient c/o Palpitations:  STAT if patient reporting lightheadedness, shortness of breath, or chest pain  How long have you had palpitations/irregular HR/ Afib? Had it this morning for an hour. Are you having the symptoms now? No   Are you currently experiencing lightheadedness, SOB or CP? No   Do you have a history of afib (atrial fibrillation) or irregular heart rhythm? yes  Have you checked your BP or HR? (document readings if available): no  Are you experiencing any other symptoms? no

## 2023-02-22 NOTE — Telephone Encounter (Signed)
Spoke with patient's husband per DPR and he states this morning while patient was cooking she got light headed and dizzy. She felt like she was going to pass out. She sat down and he gave her orange juice and she vomited. He was not able to get her vitals this am. She currently feels fine. Denies any chest pain, shortness of breath, headache or selling. She has not had anymore nausea or vomiting since this morning. She was able t o take her medications after her vomiting episode. He states he feels she was going into afib. Her BP is 135/93 hr 63. He states being that this occurred this morning he does want her to go to the hospital. Advised to continue taking medications as prescribed, monitor heart rate and stay hydrated. Will forward to MD. ED precautions discussed.

## 2023-02-25 NOTE — Telephone Encounter (Signed)
If it recurs she should go to ED - could be anything.   Looks like she has an appt to see APP tomorrow.   Apurva Reily Benbrook, DO, [facility]

## 2023-02-25 NOTE — Progress Notes (Signed)
Cardiology Office Note    Patient Name: Cynthia [name] Date of Encounter: 02/25/2023  Primary Care Provider:  Merri Brunette, MD Primary Cardiologist:  None Primary Electrophysiologist: None   Past Medical History    Past Medical History:  Diagnosis Date   Acute GI bleeding 10/27/2017   Arthritis    "joints" (11/10/2017)   Dizziness    Dysrhythmia    afib   Encounter for loop recorder check 06/09/2020   GERD (gastroesophageal reflux disease)    GI bleed 11/06/2017   Hearing loss    History of blood transfusion 10/2017   Hyperlipidemia    Hypertension    Hyperthyroidism    Loop recorder Biotronik Loop Recorder 05/23/2020 05/23/2020   Melanoma (HCC)    "cut off my back"   Memory loss    Pneumonia    Sleep apnea    cpap   Syncope and collapse     History of Present Illness  Cynthia [name] is a 82 y.o. female with a PMH of nonobstructive CAD with PEA arrest later found to be related to vasovagal syncope with bradycardia in 2019, paroxysmal AF (on Eliquis), orthostatic hypotension, OSA (on CPAP), HTN, recurrent UTIs to [facility] good visit with I think it is low I will tell her what it was for well HLD who presents today for 85-month follow-up.  Cynthia [name] was seen initially in 2018 when she presented to the ED with complaint of chest pain.  She was found to have new onset AF and was started on Xarelto and Cardizem with eventual conversion to sinus rhythm.  She underwent a POET which revealed acute ischemic changes and patient underwent LHC for further evaluation which showed no obstructive disease in her coronaries.  She was discharged with rate control and plan for starting flecainide in the future if recurrence occurred.  Patient was admitted 11/06/2017 with PEA arrest that occurred while at home with her husband with successful resuscitation.  She was brought to the ICU and admitted with vasopressors and treated with IV fluids for hypotension.  She was found to have low hemoglobin from  slow GI bleed which required PRBCs.  She underwent an echo that showed no structural abnormalities to explain PEA arrest or arrhythmogenic etiology.  Clinical presentation was found to be vasovagal syncope with persistent bradycardia.  She was readmitted 2 times in December 2019 with postural dizziness and dehydration secondary to UTI.  Carotid duplex 03/27/2018 and showed mild bilateral carotid stenosis.  She was seen 05/01/2018 and flecainide was discontinued due to lightheadedness and dizziness.  She continued to have episodes of dizziness and syncope had loop recorder implanted 05/2020.  She was seen in the ED on 07/2021 with complaint of chest pain and started on Imdur 15 mg.  She had no recurrence of chest pain however continued to experience dizziness and fatigue.  She was advised to discontinue her antihypertensive medications consisting of HCTZ.   She was last seen by Dr. Odis Hollingshead on 09/18/2022 and reported ongoing lightheadedness and dizziness with positional changes.  She had metoprolol discontinued and was continued on Cardizem along with amiodarone. Patient's blood pressure was noted to be well-controlled during visit. She was last seen in the ED on 10/10/2022 with complaint of dizziness and urinary frequency. She was currently being treated with nitrofurantoin for UTI and UTI workup showed no nitrates or bacteria.  ACS workup was negative CT of the abdomen was completed due to complaint of abdominal pain that was also normal. She contacted our  office on 02/22/2023 with complaint of lightheadedness and dizziness with no chest pain or shortness of breath.  She took her medicines following this episode with no recurrence.   Cynthia [name] presents today with her husband for follow-up of recent episode of dizziness.  She reports that this episode was prolonged and took hours to recover from. She also mentions frequent urination, which could potentially lead to dehydration. The patient has been experiencing ongoing  memory issues, lower back pain, and knee pain. The left knee, in particular, has been problematic, despite the patient having a right knee replacement. The patient also reports sinus problems, which are constant and may be contributing to the dizziness.  Her blood pressure today was stable at 142/80 and she reports compliance with her current medications.  During today's visit we discussed the possibility of dehydration and vertigo being associated with her current symptoms.  We were able to review her latest Paceart results and there was no evidence of of atrial fibrillation or arrhythmias during the date of the latest episode.  She denies chest pain, palpitations, dyspnea, PND, orthopnea, nausea, vomiting, dizziness, syncope, edema, weight gain, or early satiety.  Review of Systems  Please see the history of present illness.    All other systems reviewed and are otherwise negative except as noted above.  Physical Exam    Wt Readings from Last 3 Encounters:  02/15/23 151 lb (68.5 kg)  11/23/22 152 lb (68.9 kg)  10/10/22 149 lb 14.6 oz (68 kg)   GE:XBMWU were no vitals filed for this visit.,There is no height or weight on file to calculate BMI. GEN: Well nourished, well developed in no acute distress Neck: No JVD; No carotid bruits Pulmonary: Clear to auscultation without rales, wheezing or rhonchi  Cardiovascular: Normal rate. Regular rhythm. Normal S1. Normal S2.   Murmurs: There is no murmur.  ABDOMEN: Soft, non-tender, non-distended EXTREMITIES:  No edema; No deformity   EKG/LABS/ Recent Cardiac Studies   ECG personally reviewed by me today -none completed today  Risk Assessment/Calculations:    CHA2DS2-VASc Score = 5   This indicates a 7.2% annual risk of stroke. The patient's score is based upon: CHF History: 0 HTN History: 1 Diabetes History: 0 Stroke History: 0 Vascular Disease History: 1 Age Score: 2 Gender Score: 1         Lab Results  Component Value Date   WBC  6.4 10/10/2022   HGB 11.6 (L) 10/10/2022   HCT 34.8 (L) 10/10/2022   MCV 97.2 10/10/2022   PLT 178 10/10/2022   Lab Results  Component Value Date   CREATININE 0.97 10/10/2022   BUN 22 10/10/2022   NA 136 10/10/2022   K 3.5 10/10/2022   CL 99 10/10/2022   CO2 27 10/10/2022   Lab Results  Component Value Date   CHOL 165 11/24/2016   HDL 61 11/24/2016   LDLCALC 86 11/24/2016   TRIG 88 11/24/2016   CHOLHDL 2.7 11/24/2016    Lab Results  Component Value Date   HGBA1C 5.6 11/24/2016   Assessment & Plan    1.  Paroxysmal AF: -Patient currently on rhythm control with amiodarone 200 mg and Cardizem 180 mg for rate control.  Patient's most recent Paceart check of Linq showed no new AF episodes per device rep today -Today patient reports no palpitations or episodes of dizziness since her latest episode on 11/11 -Patient's creatinine was 0.6 and hemoglobin was 12.1 -Continue Eliquis 5 mg twice daily -Consider Cardia Mobile device for  patient to monitor for AFib at home. -CHA2DS2-VASc Score = 5 [CHF History: 0, HTN History: 1, Diabetes History: 0, Stroke History: 0, Vascular Disease History: 1, Age Score: 2, Gender Score: 1].  Therefore, the patient's annual risk of stroke is 7.2 %.      2.  Essential hypertension: -Patient's blood pressure today was controlled at 142/80 -Cardizem 180 mg and losartan 50 mg daily  3. Dehydration Frequent urination reported, possible inadequate hydration. -Encourage increased hydration, consider use of water bottle for regular intake.  4.  Vertigo: -History of vertigo, recent dizziness possibly related. No recent nausea prior to orange juice incident. -If device interrogation does not show AFib during recent episode, consider follow up with Dr. Norma Fredrickson for ear examination and management of vertigo.  5.  Obstructive sleep apnea: -Patient reports compliance with CPAP    Disposition: Follow-up with None or APP in 3 months   Signed, Napoleon Form, Leodis Rains, NP 02/25/2023, 7:19 AM [facility] heart failure showed

## 2023-02-26 ENCOUNTER — Encounter: Payer: Self-pay | Admitting: Nurse Practitioner

## 2023-02-26 ENCOUNTER — Ambulatory Visit: Payer: Medicare PPO | Attending: Nurse Practitioner | Admitting: Nurse Practitioner

## 2023-02-26 VITALS — BP 142/80 | HR 62 | Ht 65.0 in | Wt 151.8 lb

## 2023-02-26 DIAGNOSIS — G4733 Obstructive sleep apnea (adult) (pediatric): Secondary | ICD-10-CM

## 2023-02-26 DIAGNOSIS — I10 Essential (primary) hypertension: Secondary | ICD-10-CM

## 2023-02-26 DIAGNOSIS — R55 Syncope and collapse: Secondary | ICD-10-CM | POA: Diagnosis not present

## 2023-02-26 DIAGNOSIS — I480 Paroxysmal atrial fibrillation: Secondary | ICD-10-CM | POA: Diagnosis not present

## 2023-02-26 DIAGNOSIS — E782 Mixed hyperlipidemia: Secondary | ICD-10-CM

## 2023-02-26 NOTE — Patient Instructions (Addendum)
Medication Instructions:  Your physician recommends that you continue on your current medications as directed. Please refer to the Current Medication list given to you today. *If you need a refill on your cardiac medications before your next appointment, please call your pharmacy*   Lab Work: None ordered   Testing/Procedures: None Ordered   Follow-Up: At [facility], you and your health needs are our priority.  As part of our continuing mission to provide you with exceptional heart care, we have created designated Provider Care Teams.  These Care Teams include your primary Cardiologist (physician) and Advanced Practice Providers (APPs -  Physician Assistants and Nurse Practitioners) who all work together to provide you with the care you need, when you need it.  We recommend signing up for the patient portal called "MyChart".  Sign up information is provided on this After Visit Summary.  MyChart is used to connect with patients for Virtual Visits (Telemedicine).  Patients are able to view lab/test results, encounter notes, upcoming appointments, etc.  Non-urgent messages can be sent to your provider as well.   To learn more about what you can do with MyChart, go to [email or web address].    Your next appointment:   3 month(s)  Provider:   Tessa Lerner, MD or Robin Searing, NP  Other Instructions GET THE KARDIA MOBILE APP STAY HYDRATED FOLLOW UP WITH YOUR PRIMARY CARE PROVIDER

## 2023-03-05 ENCOUNTER — Encounter: Payer: Self-pay | Admitting: Physical Therapy

## 2023-03-05 ENCOUNTER — Ambulatory Visit: Payer: Medicare PPO | Attending: Physical Medicine and Rehabilitation | Admitting: Physical Therapy

## 2023-03-05 DIAGNOSIS — R2689 Other abnormalities of gait and mobility: Secondary | ICD-10-CM | POA: Insufficient documentation

## 2023-03-05 DIAGNOSIS — M25562 Pain in left knee: Secondary | ICD-10-CM | POA: Insufficient documentation

## 2023-03-05 DIAGNOSIS — R296 Repeated falls: Secondary | ICD-10-CM | POA: Diagnosis not present

## 2023-03-05 DIAGNOSIS — R293 Abnormal posture: Secondary | ICD-10-CM | POA: Diagnosis not present

## 2023-03-05 DIAGNOSIS — M6281 Muscle weakness (generalized): Secondary | ICD-10-CM | POA: Diagnosis not present

## 2023-03-05 DIAGNOSIS — G8929 Other chronic pain: Secondary | ICD-10-CM | POA: Diagnosis not present

## 2023-03-05 DIAGNOSIS — R2681 Unsteadiness on feet: Secondary | ICD-10-CM | POA: Insufficient documentation

## 2023-03-05 DIAGNOSIS — M25551 Pain in right hip: Secondary | ICD-10-CM | POA: Insufficient documentation

## 2023-03-05 NOTE — Therapy (Signed)
OUTPATIENT PHYSICAL THERAPY LOWER EXTREMITY EVALUATION   Patient Name: Cynthia [name] MRN: [id number] DOB:[birth year], 82 y.o., female Today's Date: 03/05/2023  END OF SESSION:  PT End of Session - 03/05/23 1022     Visit Number 1    Number of Visits 9   Plus eval   Date for PT Re-Evaluation 05/14/23    Authorization Type [employer]    PT Start Time 1019    PT Stop Time 1055    PT Time Calculation (min) 36 min    Equipment Utilized During Treatment Gait belt    Activity Tolerance Patient tolerated treatment well    Behavior During Therapy WFL for tasks assessed/performed             Past Medical History:  Diagnosis Date   Acute GI bleeding 10/27/2017   Arthritis    "joints" (11/10/2017)   Dizziness    Dysrhythmia    afib   Encounter for loop recorder check 06/09/2020   GERD (gastroesophageal reflux disease)    GI bleed 11/06/2017   Hearing loss    History of blood transfusion 10/2017   Hyperlipidemia    Hypertension    Hyperthyroidism    Loop recorder Biotronik Loop Recorder 05/23/2020 05/23/2020   Melanoma (HCC)    "cut off my back"   Memory loss    Pneumonia    Sleep apnea    cpap   Syncope and collapse    Past Surgical History:  Procedure Laterality Date   ABDOMINAL HYSTERECTOMY     "partial"   APPENDECTOMY     BIOPSY  11/17/2017   Procedure: BIOPSY;  Surgeon: Vida Rigger, MD;  Location: [facility];  Service: Endoscopy;;   COLONOSCOPY WITH PROPOFOL N/A 11/17/2017   Procedure: COLONOSCOPY WITH PROPOFOL;  Surgeon: Vida Rigger, MD;  Location: [facility];  Service: Endoscopy;  Laterality: N/A;   CRANIECTOMY FOR EXCISION OF ACOUSTIC NEUROMA     ESOPHAGOGASTRODUODENOSCOPY (EGD) WITH PROPOFOL N/A 10/28/2017   Procedure: ESOPHAGOGASTRODUODENOSCOPY (EGD) WITH PROPOFOL;  Surgeon: Vida Rigger, MD;  Location: [facility];  Service: Endoscopy;  Laterality: N/A;   FOOT SURGERY Bilateral 04/18/2020   KNEE ARTHROPLASTY Right 06/05/2021   Procedure: COMPUTER  ASSISTED TOTAL KNEE ARTHROPLASTY;  Surgeon: Samson Frederic, MD;  Location: [facility];  Service: Orthopedics;  Laterality: Right;   KNEE ARTHROSCOPY Left    LEFT HEART CATH AND CORONARY ANGIOGRAPHY N/A 11/24/2016   Procedure: LEFT HEART CATH AND CORONARY ANGIOGRAPHY;  Surgeon: Corky Crafts, MD;  Location: [facility] INVASIVE CV LAB;  Service: Cardiovascular;  Laterality: N/A;   MELANOMA EXCISION     "off my back"   NASAL SINUS SURGERY     TONSILLECTOMY     Patient Active Problem List   Diagnosis Date Noted   Precordial pain 07/19/2022   Coronary artery calcification seen on computed tomography 07/19/2022   Aneurysm of ascending aorta without rupture (HCC) 07/19/2022   Degenerative arthritis of right knee 06/05/2021   Osteoarthritis of right knee 06/05/2021   NPH (normal pressure hydrocephalus) (HCC) 08/19/2020   Urinary urgency 08/19/2020   Encounter for loop recorder check 06/09/2020   Loop recorder Biotronik Loop Recorder 05/23/2020 05/23/2020   Paroxysmal atrial fibrillation (HCC) 03/24/2020   Gait disturbance 01/29/2020   Periodic limb movement disorder 08/30/2019   Nocturia 07/01/2018   Midline cystocele 06/08/2018   Recurrent UTI 06/08/2018   Vaginal vault prolapse 06/08/2018   Syncope and collapse    Postural dizziness with presyncope 03/17/2018   PAF (paroxysmal atrial fibrillation) (  HCC) 03/17/2018   Memory loss 11/25/2017   Abnormal stress test    Hyperlipidemia 11/23/2016   GERD (gastroesophageal reflux disease) 11/23/2016   Polyneuropathy 10/30/2016   Bursitis, subacromial 10/30/2016   Numbness 10/30/2016   Arthritis, senescent 01/03/2016   Pain in joint, ankle and foot 02/28/2015   Dry mouth 08/07/2014   Dry eyes 08/07/2014   Other headache syndrome 06/25/2014   Sinusitis, chronic 06/25/2014   Neck pain 06/25/2014   Obstructive sleep apnea 06/25/2014   Essential hypertension, benign 06/25/2014    PCP: Merri Brunette, MD   REFERRING PROVIDER: Horton Chin, MD  REFERRING DIAG: [id number] (ICD-10-CM) - Right hip pain M25.562,G89.29 (ICD-10-CM) - Chronic pain of left knee  THERAPY DIAG:  Repeated falls  Other abnormalities of gait and mobility  Muscle weakness (generalized)  Unsteadiness on feet  Abnormal posture  Chronic pain of left knee  Pain in right hip  Rationale for Evaluation and Treatment: Rehabilitation  ONSET DATE: 02/15/2023 (referral)  SUBJECTIVE:   SUBJECTIVE STATEMENT: "[name]"  Pt presents w/SPC, requiring therapist's assistance to ambulate into clinic due to crouched posture. Pt reports she trips a lot, has had frequent falls this year. Has history of syncope and collapse, reports this is more common when it is warm so she no longer uses the stove. Has been using the cane for ~3 years but also has a RW and WC. Has had knee and hip pain for "a while". States she twisted her left ankle this morning, does not think she injured it.   PERTINENT HISTORY: R TKA in 2023, potential NPH, mild dementia  PAIN:  Are you having pain? Yes: NPRS scale: 5-7/10 Pain location: R hip and L knee and low back Pain description: Achy Aggravating factors: Cold weather, twisting her ankle Relieving factors: Pain medicine  PRECAUTIONS: Fall  RED FLAGS: Bowel or bladder incontinence: Yes: Probable due to NPH    WEIGHT BEARING RESTRICTIONS: No  FALLS:  Has patient fallen in last 6 months? Yes. Number of falls at least 4  LIVING ENVIRONMENT: Lives with: lives with their spouse and lives with their son Lives in: House/apartment Stairs: No Has following equipment at home: Single point cane, Environmental consultant - 2 wheeled, Wheelchair (manual), and Grab bars  PLOF: Requires assistive device for independence, Needs assistance with ADLs, Needs assistance with homemaking, Needs assistance with gait, and Needs assistance with transfers  PATIENT GOALS: "I know I need to get more active to make [place] (arthritis?) know he is not welcome in this body"     OBJECTIVE:  Note: Objective measures were completed at Evaluation unless otherwise noted.  DIAGNOSTIC FINDINGS: CT of head in 09/08/22 IMPRESSION: 1. No acute intracranial abnormality. 2. Unchanged ventricular prominence disproportionate to the sulci with acute callosal angle and elevated Evans index. This can be seen with idiopathic normal pressure hydrocephalus in the appropriate clinical setting.  COGNITION: Overall cognitive status: Impaired and Mild dementia and probable NPH      SENSATION: Pt denies numbness/tingling of BLEs   POSTURE: rounded shoulders, forward head, and increased thoracic kyphosis   LOWER EXTREMITY MMT: Tested in seated position   MMT Right eval Left eval  Hip flexion 4 4  Hip extension    Hip abduction 4 4  Hip adduction 4 4  Hip internal rotation    Hip external rotation    Knee flexion 4 4  Knee extension 4 4  Ankle dorsiflexion 3+ 3+  Ankle plantarflexion    Ankle inversion  Ankle eversion     (Blank rows = not tested)  BED MOBILITY:  Pt reports this is becoming more difficult - does not have bed rail   FUNCTIONAL TESTS:   [facility] PT Assessment - 03/05/23 1041       Transfers   Five time sit to stand comments  17.65s   BUE support, unable to obtain full knee/hip extension     Ambulation/Gait   Gait velocity 32.8' over 46.87s = 0.7 ft/s w/SPC   Min A due to frequent anterior LOB            GAIT Gait pattern: step through pattern, decreased stride length, decreased ankle dorsiflexion- Right, decreased ankle dorsiflexion- Left, knee flexed in stance- Right, knee flexed in stance- Left, shuffling, lateral hip instability, trunk flexed, narrow BOS, poor foot clearance- Right, and poor foot clearance- Left Distance walked: Various short clinic distances  Assistive device utilized: Single point cane Level of assistance: Min A Comments: Pt requires min A due to anterior LOB. Informed pt she needs to be using RW at all times as  she is not safe w/cane. Pt verbalized understanding, "I walk like an injured snail"    TODAY'S TREATMENT:       Next Session                                                                                                                           PATIENT EDUCATION:  Education details: Using RW at all times, POC, eval findings  Person educated: Patient Education method: Explanation Education comprehension: verbalized understanding and needs further education  HOME EXERCISE PROGRAM: To be established   ASSESSMENT:  CLINICAL IMPRESSION: Patient is a 82 year old female referred to Neuro OPPT for chronic L knee and R hip pain. Pt's PMH is significant for: Arthritis, Afib, HTN, Syncope and collapse, dizziness, GERD, hyperthyroidism, R TKA in 2023 and potential NPH. The following deficits were present during the exam: decreased strength, impaired balance, high fall risk, impaired cognition, decreased ROM and decreased safety awareness. Based on falls history and gait speed, pt is a high fall risk. Pt would benefit from skilled PT to address these impairments and functional limitations to maximize functional mobility independence.    OBJECTIVE IMPAIRMENTS: Abnormal gait, decreased activity tolerance, decreased balance, decreased cognition, decreased coordination, decreased endurance, decreased knowledge of condition, decreased knowledge of use of DME, decreased mobility, difficulty walking, decreased ROM, decreased strength, decreased safety awareness, impaired perceived functional ability, improper body mechanics, and pain.   ACTIVITY LIMITATIONS: carrying, lifting, bending, standing, squatting, stairs, transfers, bed mobility, bathing, dressing, reach over head, hygiene/grooming, locomotion level, and caring for others  PARTICIPATION LIMITATIONS: meal prep, cleaning, laundry, medication management, personal finances, interpersonal relationship, driving, shopping, community activity, yard work,  and church  PERSONAL FACTORS: Age, Behavior pattern, Fitness, Past/current experiences, and 3+ comorbidities: NPH, dementia and frequent falls  are also affecting patient's functional outcome.   REHAB POTENTIAL: Poor due to potential untreated NPH  and cognitive impairment  CLINICAL DECISION MAKING: Evolving/moderate complexity  EVALUATION COMPLEXITY: Moderate   GOALS: Goals reviewed with patient? Yes  SHORT TERM GOALS: Target date: 04/02/2023  Pt will perform initial HEP w/min A from husband and/or son for improved BLE strength and mobility   Baseline: Not established on eval  Goal status: INITIAL  2.  Pt will improve gait velocity to at least 1.0 ft/s w/RW and CGA for improved gait efficiency and safety  Baseline: 0.7 ft/s w/SPC and min A Goal status: INITIAL  3.  Pt will be compliant w/use of RW at all times for reduced fall frequency  Baseline: using SPC Goal status: INITIAL   LONG TERM GOALS: Target date: 04/30/2023   Pt will perform final HEP w/CGA from husband and/or son for improved functional strength and endurance  Baseline:  Goal status: INITIAL  2.  Pt will improve gait velocity to at least 1.3 ft/s w/RW and SBA for improved gait efficiency and independence  Baseline: 0.7 ft/s w/SPC and min A Goal status: INITIAL  3.  Pt will improve 5 x STS to less than or equal to 17 seconds w/full hip extension to demonstrate improved functional strength and transfer efficiency.   Baseline: 17.65s w/crouched posture  Goal status: INITIAL   PLAN:  PT FREQUENCY: 1x/week  PT DURATION: 8 weeks  PLANNED INTERVENTIONS: 97164- PT Re-evaluation, 97110-Therapeutic exercises, 97530- Therapeutic activity, 97112- Neuromuscular re-education, 97535- Self Care, 29518- Manual therapy, [phone]- Gait training, Patient/Family education, Balance training, and DME instructions  PLAN FOR NEXT SESSION: Supine HEP to work on posterior chain strength, quad and functional hip strength.  scifit for endurance. Is she using RW?   Jill Alexanders Azyah Flett, PT, DPT 03/05/2023, 10:57 AM

## 2023-03-10 DIAGNOSIS — N3020 Other chronic cystitis without hematuria: Secondary | ICD-10-CM | POA: Diagnosis not present

## 2023-03-10 DIAGNOSIS — N3946 Mixed incontinence: Secondary | ICD-10-CM | POA: Diagnosis not present

## 2023-03-18 ENCOUNTER — Ambulatory Visit: Payer: Medicare PPO | Attending: Physical Medicine and Rehabilitation | Admitting: Physical Therapy

## 2023-03-18 DIAGNOSIS — R2689 Other abnormalities of gait and mobility: Secondary | ICD-10-CM | POA: Diagnosis not present

## 2023-03-18 DIAGNOSIS — R296 Repeated falls: Secondary | ICD-10-CM | POA: Diagnosis not present

## 2023-03-18 DIAGNOSIS — M6281 Muscle weakness (generalized): Secondary | ICD-10-CM | POA: Diagnosis not present

## 2023-03-18 DIAGNOSIS — R293 Abnormal posture: Secondary | ICD-10-CM | POA: Diagnosis not present

## 2023-03-18 DIAGNOSIS — R2681 Unsteadiness on feet: Secondary | ICD-10-CM | POA: Insufficient documentation

## 2023-03-18 NOTE — Therapy (Signed)
OUTPATIENT PHYSICAL THERAPY LOWER EXTREMITY TREATMENT   Patient Name: Cynthia [name] MRN: [id number] DOB:Jan 02, 1941, 82 y.o., female Today's Date: 03/18/2023  END OF SESSION:  PT End of Session - 03/18/23 0940     Visit Number 2    Number of Visits 9   Plus eval   Date for PT Re-Evaluation 05/14/23    Authorization Type [employer]    PT Start Time 0939   Pt in restroom   PT Stop Time 1018    PT Time Calculation (min) 39 min    Equipment Utilized During Treatment Gait belt    Activity Tolerance Patient tolerated treatment well    Behavior During Therapy WFL for tasks assessed/performed              Past Medical History:  Diagnosis Date   Acute GI bleeding 10/27/2017   Arthritis    "joints" (11/10/2017)   Dizziness    Dysrhythmia    afib   Encounter for loop recorder check 06/09/2020   GERD (gastroesophageal reflux disease)    GI bleed 11/06/2017   Hearing loss    History of blood transfusion 10/2017   Hyperlipidemia    Hypertension    Hyperthyroidism    Loop recorder Biotronik Loop Recorder 05/23/2020 05/23/2020   Melanoma (HCC)    "cut off my back"   Memory loss    Pneumonia    Sleep apnea    cpap   Syncope and collapse    Past Surgical History:  Procedure Laterality Date   ABDOMINAL HYSTERECTOMY     "partial"   APPENDECTOMY     BIOPSY  11/17/2017   Procedure: BIOPSY;  Surgeon: Vida Rigger, MD;  Location: [facility];  Service: Endoscopy;;   COLONOSCOPY WITH PROPOFOL N/A 11/17/2017   Procedure: COLONOSCOPY WITH PROPOFOL;  Surgeon: Vida Rigger, MD;  Location: [facility];  Service: Endoscopy;  Laterality: N/A;   CRANIECTOMY FOR EXCISION OF ACOUSTIC NEUROMA     ESOPHAGOGASTRODUODENOSCOPY (EGD) WITH PROPOFOL N/A 10/28/2017   Procedure: ESOPHAGOGASTRODUODENOSCOPY (EGD) WITH PROPOFOL;  Surgeon: Vida Rigger, MD;  Location: [facility];  Service: Endoscopy;  Laterality: N/A;   FOOT SURGERY Bilateral 04/18/2020   KNEE ARTHROPLASTY Right 06/05/2021    Procedure: COMPUTER ASSISTED TOTAL KNEE ARTHROPLASTY;  Surgeon: Samson Frederic, MD;  Location: [facility];  Service: Orthopedics;  Laterality: Right;   KNEE ARTHROSCOPY Left    LEFT HEART CATH AND CORONARY ANGIOGRAPHY N/A 11/24/2016   Procedure: LEFT HEART CATH AND CORONARY ANGIOGRAPHY;  Surgeon: Corky Crafts, MD;  Location: [facility];  Service: Cardiovascular;  Laterality: N/A;   MELANOMA EXCISION     "off my back"   NASAL SINUS SURGERY     TONSILLECTOMY     Patient Active Problem List   Diagnosis Date Noted   Precordial pain 07/19/2022   Coronary artery calcification seen on computed tomography 07/19/2022   Aneurysm of ascending aorta without rupture (HCC) 07/19/2022   Degenerative arthritis of right knee 06/05/2021   Osteoarthritis of right knee 06/05/2021   NPH (normal pressure hydrocephalus) (HCC) 08/19/2020   Urinary urgency 08/19/2020   Encounter for loop recorder check 06/09/2020   Loop recorder Biotronik Loop Recorder 05/23/2020 05/23/2020   Paroxysmal atrial fibrillation (HCC) 03/24/2020   Gait disturbance 01/29/2020   Periodic limb movement disorder 08/30/2019   Nocturia 07/01/2018   Midline cystocele 06/08/2018   Recurrent UTI 06/08/2018   Vaginal vault prolapse 06/08/2018   Syncope and collapse    Postural dizziness with presyncope 03/17/2018  PAF (paroxysmal atrial fibrillation) (HCC) 03/17/2018   Memory loss 11/25/2017   Abnormal stress test    Hyperlipidemia 11/23/2016   GERD (gastroesophageal reflux disease) 11/23/2016   Polyneuropathy 10/30/2016   Bursitis, subacromial 10/30/2016   Numbness 10/30/2016   Arthritis, senescent 01/03/2016   Pain in joint, ankle and foot 02/28/2015   Dry mouth 08/07/2014   Dry eyes 08/07/2014   Other headache syndrome 06/25/2014   Sinusitis, chronic 06/25/2014   Neck pain 06/25/2014   Obstructive sleep apnea 06/25/2014   Essential hypertension, benign 06/25/2014    PCP: Merri Brunette, MD   REFERRING  PROVIDER: Horton Chin, MD  REFERRING DIAG: [phone] (ICD-10-CM) - Right hip pain M25.562,G89.29 (ICD-10-CM) - Chronic pain of left knee  THERAPY DIAG:  Other abnormalities of gait and mobility  Muscle weakness (generalized)  Unsteadiness on feet  Repeated falls  Abnormal posture  Rationale for Evaluation and Treatment: Rehabilitation  ONSET DATE: 02/15/2023 (referral)  SUBJECTIVE:   SUBJECTIVE STATEMENT: "[name]"  Pt presents w/SPC and husband, Cyndra Numbers. Did not remember to bring her RW. Denies falls since last visit. Pain is about the same   PERTINENT HISTORY: R TKA in 2023, potential NPH, mild dementia  PAIN:  Are you having pain? Yes: NPRS scale: 6/10 Pain location: R hip and L knee and low back Pain description: Achy Aggravating factors: Cold weather, twisting her ankle Relieving factors: Pain medicine  PRECAUTIONS: Fall  RED FLAGS: Bowel or bladder incontinence: Yes: Probable due to NPH    WEIGHT BEARING RESTRICTIONS: No  FALLS:  Has patient fallen in last 6 months? Yes. Number of falls at least 4  LIVING ENVIRONMENT: Lives with: lives with their spouse and lives with their son Lives in: House/apartment Stairs: No Has following equipment at home: Single point cane, Environmental consultant - 2 wheeled, Wheelchair (manual), and Grab bars  PLOF: Requires assistive device for independence, Needs assistance with ADLs, Needs assistance with homemaking, Needs assistance with gait, and Needs assistance with transfers  PATIENT GOALS: "I know I need to get more active to make Merton Border (arthritis?) know he is not welcome in this body"    OBJECTIVE:  Note: Objective measures were completed at Evaluation unless otherwise noted.  DIAGNOSTIC FINDINGS: CT of head in 09/08/22 IMPRESSION: 1. No acute intracranial abnormality. 2. Unchanged ventricular prominence disproportionate to the sulci with acute callosal angle and elevated Evans index. This can be seen with idiopathic normal  pressure hydrocephalus in the appropriate clinical setting.  COGNITION: Overall cognitive status: Impaired and Mild dementia and probable NPH      SENSATION: Pt denies numbness/tingling of BLEs   POSTURE: rounded shoulders, forward head, and increased thoracic kyphosis   LOWER EXTREMITY MMT: Tested in seated position   MMT Right eval Left eval  Hip flexion 4 4  Hip extension    Hip abduction 4 4  Hip adduction 4 4  Hip internal rotation    Hip external rotation    Knee flexion 4 4  Knee extension 4 4  Ankle dorsiflexion 3+ 3+  Ankle plantarflexion    Ankle inversion    Ankle eversion     (Blank rows = not tested)  BED MOBILITY:  Pt reports this is becoming more difficult - does not have bed rail    GAIT Gait pattern: step through pattern, decreased stride length, decreased ankle dorsiflexion- Right, decreased ankle dorsiflexion- Left, knee flexed in stance- Right, knee flexed in stance- Left, shuffling, lateral hip instability, trunk flexed, narrow BOS, poor foot clearance- Right,  and poor foot clearance- Left Distance walked: Various short clinic distances  Assistive device utilized: Single point cane and Walker - 2 wheeled Level of assistance: CGA and Min A Comments: Pt ambulated into and out of clinic w/SPC, requiring HHA and min A for safety. Used RW during session and pt required CGA. Noted improved posture and step length bilaterally w/RW    TODAY'S TREATMENT:       Ther Ex  SciFit multi-peaks level 3 for 8 minutes using BUE/BLEs for neural priming for reciprocal movement, dynamic cardiovascular warmup and increased amplitude of stepping. Min cues for increased amplitude of movement throughout.  Seated LAQ w/resistance via yellow theraband (tied loosely), x10 per side w/2s isometric hold. Pt reported exercises as easy, so increased tightness of band and performed additional set of 10 per side. Pt requires max multimodal cues to maintain proper form of movement.   Seated marching w/yellow theraband tied around distal quads, x10 per side.  Supine glute bridges x5. Noted genu valgus position bilaterally as well as increase weight shift to R side, so added yellow band around distal quads to provide tactile cue for hip abduction x10 reps. Min cues to maintain midline orientation throughout.  Established HEP (see bolded below) and reviewed handout w/pt and husband.  Escorted pt out of clinic to car (see gait above)                                                                                                                          PATIENT EDUCATION:  Education details: Reiterating importance of using RW at all times, initial HEP Person educated: Patient and Spouse Education method: Explanation Education comprehension: verbalized understanding and needs further education  HOME EXERCISE PROGRAM: Access Code: [email or web address] URL: [email or web address] Date: 03/18/2023 Prepared by: Alethia Berthold Moriah Loughry  Exercises - Seated Knee Extension with Resistance  - 1 x daily - 7 x weekly - 3 sets - 10 reps - 2 second hold - Seated Knee Lifts with Resistance  - 1 x daily - 7 x weekly - 3 sets - 10 reps - Supine Bridge with Resistance Band  - 1 x daily - 7 x weekly - 3 sets - 10 reps  ASSESSMENT:  CLINICAL IMPRESSION: Emphasis of skilled PT session on endurance, education on safety w/mobility and establishing initial HEP for BLE strength. Pt ambulated into and out of clinic w/SPC, requiring min A and HHA to safely navigate. Reiterated importance of using RW at all times for fall prevention and safety, as pt relies on holding onto husband or furniture to stabilize. Established initial HEP which pt demonstrated well, but required min-mod multimodal cues to maintain form. Continue POC.    OBJECTIVE IMPAIRMENTS: Abnormal gait, decreased activity tolerance, decreased balance, decreased cognition, decreased coordination, decreased endurance, decreased knowledge of  condition, decreased knowledge of use of DME, decreased mobility, difficulty walking, decreased ROM, decreased strength, decreased safety awareness, impaired perceived functional ability, improper body mechanics, and pain.   ACTIVITY LIMITATIONS: carrying, lifting,  bending, standing, squatting, stairs, transfers, bed mobility, bathing, dressing, reach over head, hygiene/grooming, locomotion level, and caring for others  PARTICIPATION LIMITATIONS: meal prep, cleaning, laundry, medication management, personal finances, interpersonal relationship, driving, shopping, community activity, yard work, and church  PERSONAL FACTORS: Age, Behavior pattern, Fitness, Past/current experiences, and 3+ comorbidities: NPH, dementia and frequent falls  are also affecting patient's functional outcome.   REHAB POTENTIAL: Poor due to potential untreated NPH and cognitive impairment  CLINICAL DECISION MAKING: Evolving/moderate complexity  EVALUATION COMPLEXITY: Moderate   GOALS: Goals reviewed with patient? Yes  SHORT TERM GOALS: Target date: 04/02/2023  Pt will perform initial HEP w/min A from husband and/or son for improved BLE strength and mobility   Baseline: Not established on eval  Goal status: INITIAL  2.  Pt will improve gait velocity to at least 1.0 ft/s w/RW and CGA for improved gait efficiency and safety  Baseline: 0.7 ft/s w/SPC and min A Goal status: INITIAL  3.  Pt will be compliant w/use of RW at all times for reduced fall frequency  Baseline: using SPC Goal status: INITIAL   LONG TERM GOALS: Target date: 04/30/2023   Pt will perform final HEP w/CGA from husband and/or son for improved functional strength and endurance  Baseline:  Goal status: INITIAL  2.  Pt will improve gait velocity to at least 1.3 ft/s w/RW and SBA for improved gait efficiency and independence  Baseline: 0.7 ft/s w/SPC and min A Goal status: INITIAL  3.  Pt will improve 5 x STS to less than or equal to 17  seconds w/full hip extension to demonstrate improved functional strength and transfer efficiency.   Baseline: 17.65s w/crouched posture  Goal status: INITIAL   PLAN:  PT FREQUENCY: 1x/week  PT DURATION: 8 weeks  PLANNED INTERVENTIONS: 97164- PT Re-evaluation, 97110-Therapeutic exercises, 97530- Therapeutic activity, 97112- Neuromuscular re-education, 97535- Self Care, 82956- Manual therapy, [phone]- Gait training, Patient/Family education, Balance training, and DME instructions  PLAN FOR NEXT SESSION: Add to HEP to work on posterior chain strength, quad and functional hip strength. scifit for endurance. Is she using RW? Monster walks, toe taps, seated DL   Blaire Palomino E Ry Moody, PT, DPT 03/18/2023, 10:24 AM

## 2023-03-19 ENCOUNTER — Ambulatory Visit: Payer: Self-pay | Admitting: Cardiology

## 2023-03-19 ENCOUNTER — Ambulatory Visit: Payer: Medicare PPO | Admitting: Physical Therapy

## 2023-03-26 ENCOUNTER — Ambulatory Visit: Payer: Medicare PPO | Admitting: Physical Therapy

## 2023-04-01 ENCOUNTER — Ambulatory Visit: Payer: Medicare PPO | Admitting: Physical Therapy

## 2023-04-02 ENCOUNTER — Ambulatory Visit: Payer: Medicare PPO | Admitting: Physical Therapy

## 2023-04-02 DIAGNOSIS — M6281 Muscle weakness (generalized): Secondary | ICD-10-CM

## 2023-04-02 DIAGNOSIS — R2681 Unsteadiness on feet: Secondary | ICD-10-CM

## 2023-04-02 DIAGNOSIS — R2689 Other abnormalities of gait and mobility: Secondary | ICD-10-CM | POA: Diagnosis not present

## 2023-04-02 DIAGNOSIS — R296 Repeated falls: Secondary | ICD-10-CM | POA: Diagnosis not present

## 2023-04-02 DIAGNOSIS — R293 Abnormal posture: Secondary | ICD-10-CM

## 2023-04-02 NOTE — Therapy (Addendum)
OUTPATIENT PHYSICAL THERAPY LOWER EXTREMITY TREATMENT   Patient Name: Cynthia [name] MRN: [id number] DOB:Apr 08, 1941, 82 y.o., female Today's Date: 04/02/2023  END OF SESSION:  PT End of Session - 04/02/23 1407     Visit Number 3    Number of Visits 9   Plus eval   Date for PT Re-Evaluation 05/14/23    Authorization Type [employer]    PT Start Time 1405   Pt in restroom   PT Stop Time 1448   Pt in restroom for 5 minutes during session -not billed   PT Time Calculation (min) 43 min    Equipment Utilized During Treatment Gait belt    Activity Tolerance Patient tolerated treatment well    Behavior During Therapy [facility] for tasks assessed/performed               Past Medical History:  Diagnosis Date   Acute GI bleeding 10/27/2017   Arthritis    "joints" (11/10/2017)   Dizziness    Dysrhythmia    afib   Encounter for loop recorder check 06/09/2020   GERD (gastroesophageal reflux disease)    GI bleed 11/06/2017   Hearing loss    History of blood transfusion 10/2017   Hyperlipidemia    Hypertension    Hyperthyroidism    Loop recorder Biotronik Loop Recorder 05/23/2020 05/23/2020   Melanoma (HCC)    "cut off my back"   Memory loss    Pneumonia    Sleep apnea    cpap   Syncope and collapse    Past Surgical History:  Procedure Laterality Date   ABDOMINAL HYSTERECTOMY     "partial"   APPENDECTOMY     BIOPSY  11/17/2017   Procedure: BIOPSY;  Surgeon: Vida Rigger, MD;  Location: [facility];  Service: Endoscopy;;   COLONOSCOPY WITH PROPOFOL N/A 11/17/2017   Procedure: COLONOSCOPY WITH PROPOFOL;  Surgeon: Vida Rigger, MD;  Location: [facility];  Service: Endoscopy;  Laterality: N/A;   CRANIECTOMY FOR EXCISION OF ACOUSTIC NEUROMA     ESOPHAGOGASTRODUODENOSCOPY (EGD) WITH PROPOFOL N/A 10/28/2017   Procedure: ESOPHAGOGASTRODUODENOSCOPY (EGD) WITH PROPOFOL;  Surgeon: Vida Rigger, MD;  Location: [facility];  Service: Endoscopy;  Laterality: N/A;   FOOT SURGERY  Bilateral 04/18/2020   KNEE ARTHROPLASTY Right 06/05/2021   Procedure: COMPUTER ASSISTED TOTAL KNEE ARTHROPLASTY;  Surgeon: Samson Frederic, MD;  Location: [facility];  Service: Orthopedics;  Laterality: Right;   KNEE ARTHROSCOPY Left    LEFT HEART CATH AND CORONARY ANGIOGRAPHY N/A 11/24/2016   Procedure: LEFT HEART CATH AND CORONARY ANGIOGRAPHY;  Surgeon: Corky Crafts, MD;  Location: [facility] INVASIVE CV LAB;  Service: Cardiovascular;  Laterality: N/A;   MELANOMA EXCISION     "off my back"   NASAL SINUS SURGERY     TONSILLECTOMY     Patient Active Problem List   Diagnosis Date Noted   Precordial pain 07/19/2022   Coronary artery calcification seen on computed tomography 07/19/2022   Aneurysm of ascending aorta without rupture (HCC) 07/19/2022   Degenerative arthritis of right knee 06/05/2021   Osteoarthritis of right knee 06/05/2021   NPH (normal pressure hydrocephalus) (HCC) 08/19/2020   Urinary urgency 08/19/2020   Encounter for loop recorder check 06/09/2020   Loop recorder Biotronik Loop Recorder 05/23/2020 05/23/2020   Paroxysmal atrial fibrillation (HCC) 03/24/2020   Gait disturbance 01/29/2020   Periodic limb movement disorder 08/30/2019   Nocturia 07/01/2018   Midline cystocele 06/08/2018   Recurrent UTI 06/08/2018   Vaginal vault prolapse 06/08/2018  Syncope and collapse    Postural dizziness with presyncope 03/17/2018   PAF (paroxysmal atrial fibrillation) (HCC) 03/17/2018   Memory loss 11/25/2017   Abnormal stress test    Hyperlipidemia 11/23/2016   GERD (gastroesophageal reflux disease) 11/23/2016   Polyneuropathy 10/30/2016   Bursitis, subacromial 10/30/2016   Numbness 10/30/2016   Arthritis, senescent 01/03/2016   Pain in joint, ankle and foot 02/28/2015   Dry mouth 08/07/2014   Dry eyes 08/07/2014   Other headache syndrome 06/25/2014   Sinusitis, chronic 06/25/2014   Neck pain 06/25/2014   Obstructive sleep apnea 06/25/2014   Essential hypertension, benign  06/25/2014    PCP: Merri Brunette, MD   REFERRING PROVIDER: Horton Chin, MD  REFERRING DIAG: [phone] (ICD-10-CM) - Right hip pain M25.562,G89.29 (ICD-10-CM) - Chronic pain of left knee  THERAPY DIAG:  Muscle weakness (generalized)  Unsteadiness on feet  Abnormal posture  Other abnormalities of gait and mobility  Rationale for Evaluation and Treatment: Rehabilitation  ONSET DATE: 02/15/2023 (referral)  SUBJECTIVE:   SUBJECTIVE STATEMENT: "[name]"  Pt presents w/RW. States she has not been doing her HEP. "I guess I am lazy and inattentive". Denies falls.   Husband, Cyndra Numbers, stayed in lobby   PERTINENT HISTORY: R TKA in 2023, potential NPH, mild dementia  PAIN:  Are you having pain? Yes: NPRS scale: 4.8/10 Pain location: R hip and L knee and low back Pain description: Achy Aggravating factors: Cold weather, twisting her ankle Relieving factors: Pain medicine  PRECAUTIONS: Fall  RED FLAGS: Bowel or bladder incontinence: Yes: Probable due to NPH    WEIGHT BEARING RESTRICTIONS: No  FALLS:  Has patient fallen in last 6 months? Yes. Number of falls at least 4  LIVING ENVIRONMENT: Lives with: lives with their spouse and lives with their son Lives in: House/apartment Stairs: No Has following equipment at home: Single point cane, Environmental consultant - 2 wheeled, Wheelchair (manual), and Grab bars  PLOF: Requires assistive device for independence, Needs assistance with ADLs, Needs assistance with homemaking, Needs assistance with gait, and Needs assistance with transfers  PATIENT GOALS: "I know I need to get more active to make Merton Border (arthritis?) know he is not welcome in this body"    OBJECTIVE:  Note: Objective measures were completed at Evaluation unless otherwise noted.  DIAGNOSTIC FINDINGS: CT of head in 09/08/22 IMPRESSION: 1. No acute intracranial abnormality. 2. Unchanged ventricular prominence disproportionate to the sulci with acute callosal angle and elevated  Evans index. This can be seen with idiopathic normal pressure hydrocephalus in the appropriate clinical setting.  COGNITION: Overall cognitive status: Impaired and Mild dementia and probable NPH      SENSATION: Pt denies numbness/tingling of BLEs   POSTURE: rounded shoulders, forward head, and increased thoracic kyphosis   LOWER EXTREMITY MMT: Tested in seated position   MMT Right eval Left eval  Hip flexion 4 4  Hip extension    Hip abduction 4 4  Hip adduction 4 4  Hip internal rotation    Hip external rotation    Knee flexion 4 4  Knee extension 4 4  Ankle dorsiflexion 3+ 3+  Ankle plantarflexion    Ankle inversion    Ankle eversion     (Blank rows = not tested)  BED MOBILITY:  Pt reports this is becoming more difficult - does not have bed rail      TODAY'S TREATMENT:       Ther Ex  SciFit multi-peaks level 5 for 8 minutes using BUE/BLEs for neural priming  for reciprocal movement, dynamic cardiovascular warmup and increased amplitude of stepping. Min cues for increased amplitude of movement throughout. RPE of 8.5/10 following activity  In // bars for improved single leg stability, BLE strength and LE coordination:  Alt toe taps to 6" step w/BUE support, x8 per side. Min cues for increased step clearance w/RLE  Alt step ups to 6" step w/BUE support, x5 per side. No instability noted but pt reported need to have BM following exercise, so escorted pt to restroom (pt not billed for this time).        Double leg presses using 50#, 2x12 reps, for improved quad strength. Mod verbal cues for improved eccentric control, which pt performed well. No pain reported w/movement  Seated DL w/blue resistance band, x15 reps, for improved posterior chain strength and proper lifting technique. Pt performed well and reported mild discomfort in R low back, but no pain.  Sit to stands w/4# med ball throw, x10 reps, for improved functional strength and high amplitude movement. Pt performed  well w/no LOB, SBA throughout for safety.    Gait pattern: step through pattern, decreased stride length, knee flexed in stance- Right, knee flexed in stance- Left, lateral hip instability, trunk flexed, and narrow BOS Distance walked: Various clinic distances  Assistive device utilized: Environmental consultant - 2 wheeled Level of assistance: SBA and CGA Comments: Pt required mod verbal cues for proper RW management when approaching seated surface, as pt initially placing walker out of reach and furniture walking or reaching out of BOS to get to seat. Pt able to demonstrate proper AD management for remainder of session.                                      PATIENT EDUCATION:  Education details: Continue HEP  Person educated: Patient and Spouse Education method: Explanation Education comprehension: verbalized understanding and needs further education  HOME EXERCISE PROGRAM: Access Code: [email or web address] URL: [email or web address] Date: 03/18/2023 Prepared by: Alethia Berthold Berklee Battey  Exercises - Seated Knee Extension with Resistance  - 1 x daily - 7 x weekly - 3 sets - 10 reps - 2 second hold - Seated Knee Lifts with Resistance  - 1 x daily - 7 x weekly - 3 sets - 10 reps - Supine Bridge with Resistance Band  - 1 x daily - 7 x weekly - 3 sets - 10 reps  ASSESSMENT:  CLINICAL IMPRESSION: Emphasis of skilled PT session on endurance, BLE strength and proper management of RW. Pt tends to place walker away from her when approaching a seated surface, so practiced proper approach and turn technique w/RW w/good demonstration noted. Pt tolerated session well w/no pain reported, but continues to have discomfort in L knee and R low back. Pt needing to use restroom in middle of session, did not bill for this time. Continue POC.    OBJECTIVE IMPAIRMENTS: Abnormal gait, decreased activity tolerance, decreased balance, decreased cognition, decreased coordination, decreased endurance, decreased knowledge of condition,  decreased knowledge of use of DME, decreased mobility, difficulty walking, decreased ROM, decreased strength, decreased safety awareness, impaired perceived functional ability, improper body mechanics, and pain.   ACTIVITY LIMITATIONS: carrying, lifting, bending, standing, squatting, stairs, transfers, bed mobility, bathing, dressing, reach over head, hygiene/grooming, locomotion level, and caring for others  PARTICIPATION LIMITATIONS: meal prep, cleaning, laundry, medication management, personal finances, interpersonal relationship, driving, shopping, community activity, yard work, and church  PERSONAL FACTORS: Age,  Behavior pattern, Fitness, Past/current experiences, and 3+ comorbidities: NPH, dementia and frequent falls  are also affecting patient's functional outcome.   REHAB POTENTIAL: Poor due to potential untreated NPH and cognitive impairment  CLINICAL DECISION MAKING: Evolving/moderate complexity  EVALUATION COMPLEXITY: Moderate   GOALS: Goals reviewed with patient? Yes  SHORT TERM GOALS: Target date: 04/02/2023  Pt will perform initial HEP w/min A from husband and/or son for improved BLE strength and mobility   Baseline: Not established on eval  Goal status: INITIAL  2.  Pt will improve gait velocity to at least 1.0 ft/s w/RW and CGA for improved gait efficiency and safety  Baseline: 0.7 ft/s w/SPC and min A Goal status: INITIAL  3.  Pt will be compliant w/use of RW at all times for reduced fall frequency  Baseline: using SPC Goal status: INITIAL   LONG TERM GOALS: Target date: 04/30/2023   Pt will perform final HEP w/CGA from husband and/or son for improved functional strength and endurance  Baseline:  Goal status: INITIAL  2.  Pt will improve gait velocity to at least 1.3 ft/s w/RW and SBA for improved gait efficiency and independence  Baseline: 0.7 ft/s w/SPC and min A Goal status: INITIAL  3.  Pt will improve 5 x STS to less than or equal to 17 seconds  w/full hip extension to demonstrate improved functional strength and transfer efficiency.   Baseline: 17.65s w/crouched posture  Goal status: INITIAL   PLAN:  PT FREQUENCY: 1x/week  PT DURATION: 8 weeks  PLANNED INTERVENTIONS: 97164- PT Re-evaluation, 97110-Therapeutic exercises, 97530- Therapeutic activity, [id number]- Neuromuscular re-education, 97535- Self Care, [id number]- Manual therapy, [phone]- Gait training, Patient/Family education, Balance training, and DME instructions  PLAN FOR NEXT SESSION: Goals. Add to HEP to work on posterior chain strength, quad and functional hip strength. scifit for endurance. Proper AD management, postural control, LLE strength    Jerett Odonohue E Jearlean Demauro, PT, DPT 04/02/2023, 2:53 PM

## 2023-04-06 ENCOUNTER — Ambulatory Visit: Payer: Medicare PPO | Admitting: Physical Therapy

## 2023-04-07 ENCOUNTER — Emergency Department (HOSPITAL_COMMUNITY): Payer: Medicare PPO

## 2023-04-07 ENCOUNTER — Observation Stay (HOSPITAL_COMMUNITY)
Admission: EM | Admit: 2023-04-07 | Discharge: 2023-04-08 | Disposition: A | Discharge status: Home / Self Care | Payer: Medicare PPO | Attending: Family Medicine | Admitting: Family Medicine

## 2023-04-07 ENCOUNTER — Encounter (HOSPITAL_COMMUNITY): Payer: Self-pay

## 2023-04-07 ENCOUNTER — Other Ambulatory Visit: Payer: Self-pay

## 2023-04-07 DIAGNOSIS — R9082 White matter disease, unspecified: Secondary | ICD-10-CM | POA: Diagnosis not present

## 2023-04-07 DIAGNOSIS — K219 Gastro-esophageal reflux disease without esophagitis: Secondary | ICD-10-CM | POA: Insufficient documentation

## 2023-04-07 DIAGNOSIS — R55 Syncope and collapse: Secondary | ICD-10-CM | POA: Diagnosis not present

## 2023-04-07 DIAGNOSIS — N1832 Chronic kidney disease, stage 3b: Secondary | ICD-10-CM | POA: Insufficient documentation

## 2023-04-07 DIAGNOSIS — R2681 Unsteadiness on feet: Secondary | ICD-10-CM | POA: Insufficient documentation

## 2023-04-07 DIAGNOSIS — Z7901 Long term (current) use of anticoagulants: Secondary | ICD-10-CM | POA: Insufficient documentation

## 2023-04-07 DIAGNOSIS — I480 Paroxysmal atrial fibrillation: Secondary | ICD-10-CM | POA: Diagnosis not present

## 2023-04-07 DIAGNOSIS — Z79899 Other long term (current) drug therapy: Secondary | ICD-10-CM | POA: Diagnosis not present

## 2023-04-07 DIAGNOSIS — E785 Hyperlipidemia, unspecified: Secondary | ICD-10-CM | POA: Diagnosis not present

## 2023-04-07 DIAGNOSIS — I3481 Nonrheumatic mitral (valve) annulus calcification: Secondary | ICD-10-CM | POA: Diagnosis not present

## 2023-04-07 DIAGNOSIS — E860 Dehydration: Secondary | ICD-10-CM | POA: Diagnosis not present

## 2023-04-07 DIAGNOSIS — I129 Hypertensive chronic kidney disease with stage 1 through stage 4 chronic kidney disease, or unspecified chronic kidney disease: Secondary | ICD-10-CM | POA: Insufficient documentation

## 2023-04-07 DIAGNOSIS — R0602 Shortness of breath: Secondary | ICD-10-CM | POA: Insufficient documentation

## 2023-04-07 DIAGNOSIS — F32A Depression, unspecified: Secondary | ICD-10-CM | POA: Diagnosis not present

## 2023-04-07 DIAGNOSIS — I6523 Occlusion and stenosis of bilateral carotid arteries: Secondary | ICD-10-CM | POA: Diagnosis not present

## 2023-04-07 DIAGNOSIS — Z87891 Personal history of nicotine dependence: Secondary | ICD-10-CM | POA: Diagnosis not present

## 2023-04-07 DIAGNOSIS — I77810 Thoracic aortic ectasia: Secondary | ICD-10-CM | POA: Diagnosis not present

## 2023-04-07 DIAGNOSIS — E039 Hypothyroidism, unspecified: Secondary | ICD-10-CM | POA: Diagnosis not present

## 2023-04-07 DIAGNOSIS — G3184 Mild cognitive impairment, so stated: Secondary | ICD-10-CM | POA: Diagnosis not present

## 2023-04-07 DIAGNOSIS — R2689 Other abnormalities of gait and mobility: Secondary | ICD-10-CM | POA: Diagnosis not present

## 2023-04-07 DIAGNOSIS — I2510 Atherosclerotic heart disease of native coronary artery without angina pectoris: Secondary | ICD-10-CM | POA: Insufficient documentation

## 2023-04-07 DIAGNOSIS — Z96651 Presence of right artificial knee joint: Secondary | ICD-10-CM | POA: Insufficient documentation

## 2023-04-07 DIAGNOSIS — I130 Hypertensive heart and chronic kidney disease with heart failure and stage 1 through stage 4 chronic kidney disease, or unspecified chronic kidney disease: Secondary | ICD-10-CM | POA: Insufficient documentation

## 2023-04-07 DIAGNOSIS — R42 Dizziness and giddiness: Secondary | ICD-10-CM | POA: Diagnosis not present

## 2023-04-07 LAB — MAGNESIUM: Magnesium: 2.1 mg/dL (ref 1.7–2.4)

## 2023-04-07 LAB — CBC WITH DIFFERENTIAL/PLATELET
Abs Immature Granulocytes: 0.01 10*3/uL (ref 0.00–0.07)
Basophils Absolute: 0.1 10*3/uL (ref 0.0–0.1)
Basophils Relative: 1 %
Eosinophils Absolute: 0.1 10*3/uL (ref 0.0–0.5)
Eosinophils Relative: 1 %
HCT: 39 % (ref 36.0–46.0)
Hemoglobin: 12.7 g/dL (ref 12.0–15.0)
Immature Granulocytes: 0 %
Lymphocytes Relative: 19 %
Lymphs Abs: 1.2 10*3/uL (ref 0.7–4.0)
MCH: 32 pg (ref 26.0–34.0)
MCHC: 32.6 g/dL (ref 30.0–36.0)
MCV: 98.2 fL (ref 80.0–100.0)
Monocytes Absolute: 0.5 10*3/uL (ref 0.1–1.0)
Monocytes Relative: 8 %
Neutro Abs: 4.7 10*3/uL (ref 1.7–7.7)
Neutrophils Relative %: 71 %
Platelets: 191 10*3/uL (ref 150–400)
RBC: 3.97 MIL/uL (ref 3.87–5.11)
RDW: 12.9 % (ref 11.5–15.5)
WBC: 6.5 10*3/uL (ref 4.0–10.5)
nRBC: 0 % (ref 0.0–0.2)

## 2023-04-07 LAB — COMPREHENSIVE METABOLIC PANEL
ALT: 11 U/L (ref 0–44)
AST: 23 U/L (ref 15–41)
Albumin: 3.3 g/dL — ABNORMAL LOW (ref 3.5–5.0)
Alkaline Phosphatase: 36 U/L — ABNORMAL LOW (ref 38–126)
Anion gap: 15 (ref 5–15)
BUN: 32 mg/dL — ABNORMAL HIGH (ref 8–23)
CO2: 20 mmol/L — ABNORMAL LOW (ref 22–32)
Calcium: 8.4 mg/dL — ABNORMAL LOW (ref 8.9–10.3)
Chloride: 103 mmol/L (ref 98–111)
Creatinine, Ser: 1.19 mg/dL — ABNORMAL HIGH (ref 0.44–1.00)
GFR, Estimated: 46 mL/min — ABNORMAL LOW (ref >60)
Glucose, Bld: 161 mg/dL — ABNORMAL HIGH (ref 70–99)
Potassium: 3.6 mmol/L (ref 3.5–5.1)
Sodium: 138 mmol/L (ref 135–145)
Total Bilirubin: 0.8 mg/dL (ref <1.2)
Total Protein: 5.6 g/dL — ABNORMAL LOW (ref 6.5–8.1)

## 2023-04-07 LAB — CBG MONITORING, ED: Glucose-Capillary: 170 mg/dL — ABNORMAL HIGH (ref 70–99)

## 2023-04-07 LAB — TSH: TSH: 0.646 u[IU]/mL (ref 0.350–4.500)

## 2023-04-07 LAB — TROPONIN I (HIGH SENSITIVITY)
Troponin I (High Sensitivity): 5 ng/L (ref <18)
Troponin I (High Sensitivity): 7 ng/L (ref <18)

## 2023-04-07 LAB — BRAIN NATRIURETIC PEPTIDE: B Natriuretic Peptide: 53.3 pg/mL (ref 0.0–100.0)

## 2023-04-07 MED ORDER — HYDROCODONE-ACETAMINOPHEN 5-325 MG PO TABS: 1.0000 | ORAL_TABLET | ORAL | Status: DC | PRN

## 2023-04-07 MED ORDER — LOSARTAN POTASSIUM 50 MG PO TABS
50.0000 mg | ORAL_TABLET | Freq: Every evening | ORAL | Status: DC
  Administered 2023-04-07: 50 mg via ORAL
  Filled 2023-04-07: qty 1

## 2023-04-07 MED ORDER — AMIODARONE HCL 200 MG PO TABS
200.0000 mg | ORAL_TABLET | Freq: Every day | ORAL | Status: DC
  Administered 2023-04-07 – 2023-04-08 (×2): 200 mg via ORAL
  Filled 2023-04-07 (×2): qty 1

## 2023-04-07 MED ORDER — LACTATED RINGERS IV BOLUS
500.0000 mL | Freq: Once | INTRAVENOUS | Status: AC
Start: 2023-04-07 — End: 2023-04-07
  Administered 2023-04-07: 500 mL via INTRAVENOUS

## 2023-04-07 MED ORDER — LACTATED RINGERS IV BOLUS
500.0000 mL | Freq: Once | INTRAVENOUS | Status: AC
  Administered 2023-04-07: 500 mL via INTRAVENOUS

## 2023-04-07 MED ORDER — SERTRALINE HCL 50 MG PO TABS
25.0000 mg | ORAL_TABLET | Freq: Every day | ORAL | Status: DC
  Administered 2023-04-07 – 2023-04-08 (×2): 25 mg via ORAL
  Filled 2023-04-07 (×2): qty 1

## 2023-04-07 MED ORDER — DILTIAZEM HCL ER COATED BEADS 180 MG PO CP24
180.0000 mg | ORAL_CAPSULE | Freq: Every day | ORAL | Status: DC
  Administered 2023-04-07 – 2023-04-08 (×2): 180 mg via ORAL
  Filled 2023-04-07 (×2): qty 1

## 2023-04-07 MED ORDER — DONEPEZIL HCL 5 MG PO TABS
5.0000 mg | ORAL_TABLET | Freq: Every day | ORAL | Status: DC
  Administered 2023-04-07: 5 mg via ORAL
  Filled 2023-04-07: qty 1

## 2023-04-07 MED ORDER — ATORVASTATIN CALCIUM 10 MG PO TABS
10.0000 mg | ORAL_TABLET | Freq: Every day | ORAL | Status: DC
  Administered 2023-04-07 – 2023-04-08 (×2): 10 mg via ORAL
  Filled 2023-04-07 (×2): qty 1

## 2023-04-07 MED ORDER — ACETAMINOPHEN 650 MG RE SUPP: 650.0000 mg | Freq: Four times a day (QID) | RECTAL | Status: DC | PRN

## 2023-04-07 MED ORDER — ONDANSETRON HCL 4 MG/2ML IJ SOLN: 4.0000 mg | Freq: Four times a day (QID) | INTRAMUSCULAR | Status: DC | PRN

## 2023-04-07 MED ORDER — ACETAMINOPHEN 325 MG PO TABS: 650.0000 mg | ORAL_TABLET | Freq: Four times a day (QID) | ORAL | Status: DC | PRN

## 2023-04-07 MED ORDER — LACTATED RINGERS IV SOLN: INTRAVENOUS | Status: DC

## 2023-04-07 MED ORDER — PANTOPRAZOLE SODIUM 40 MG PO TBEC
40.0000 mg | DELAYED_RELEASE_TABLET | Freq: Every day | ORAL | Status: DC
  Administered 2023-04-08: 40 mg via ORAL
  Filled 2023-04-07: qty 1

## 2023-04-07 MED ORDER — IOHEXOL 350 MG/ML SOLN
75.0000 mL | Freq: Once | INTRAVENOUS | Status: AC | PRN
  Administered 2023-04-07: 75 mL via INTRAVENOUS

## 2023-04-07 MED ORDER — APIXABAN 5 MG PO TABS
5.0000 mg | ORAL_TABLET | Freq: Two times a day (BID) | ORAL | Status: DC
  Administered 2023-04-07 – 2023-04-08 (×2): 5 mg via ORAL
  Filled 2023-04-07 (×2): qty 1

## 2023-04-07 MED ORDER — ONDANSETRON HCL 4 MG PO TABS
4.0000 mg | ORAL_TABLET | Freq: Four times a day (QID) | ORAL | Status: DC | PRN
Start: 2023-04-07 — End: 2023-04-08

## 2023-04-07 NOTE — ED Provider Notes (Signed)
Cynthia [name] EMERGENCY DEPARTMENT AT [facility] Provider Note   CSN: [id number] Arrival date & time: 04/07/23  1157     History  Chief Complaint  Patient presents with   Loss of Consciousness    Syncope     Cynthia [name] is a 82 y.o. female.  HPI Patient presents for syncope.  Medical history includes HTN, HLD, GERD, arthritis, GI bleed, sleep apnea, atrial fibrillation, CAD, and ascending aortic aneurysm.  In 2019, she had a PEA arrest that was suspected to be related to a vasovagal syncope episode with bradycardia.  Current medications include Eliquis, amiodarone and Cardizem.  She underwent a loop recorder placement 2 months ago which has not shown any events.  History from the patient is limited by short-term memory loss.  She is seen by neurology for mild dementia.  Husband reports that she has had ongoing episodes of dizziness.  Typically, she will treat this with fluids and rest.  She had some dizziness this morning at 8 AM.  She took a dose of meclizine.  They went to [facility] for breakfast.  As she was leaving, she was walking with her walker.  She had dizziness and had a syncopal episode.  She was eased into a booth.  Eyelids were fluttering.  LOC lasted for 30 seconds.  Patient has been awake and alert with EMS.  Vital signs have been normal.  Rhythm strip showed sinus rhythm.  Patient currently denies any areas of discomfort.    Home Medications Prior to Admission medications   Medication Sig Start Date End Date Taking? Authorizing Provider  acetaminophen (TYLENOL) 500 MG tablet Take 1,000 mg by mouth at bedtime.    [provider]  Alpha-Lipoic Acid (LIPOIC ACID PO) Take 600 mg by mouth daily.    [provider]  amiodarone (PACERONE) 200 MG tablet Take 1 tablet (200 mg total) by mouth daily. 09/10/21   Cantwell, Celeste C, PA-C  apixaban (ELIQUIS) 5 MG TABS tablet Take 1 tablet (5 mg total) by mouth 2 (two) times daily. 02/15/23   Tolia, Sunit, DO   atorvastatin (LIPITOR) 10 MG tablet Take 1 tablet (10 mg total) by mouth daily. 08/13/22   Custovic, Rozell Searing, DO  AZO-CRANBERRY PO Take 1 tablet by mouth daily.    [provider]  Calcium Carbonate-Vitamin D3 600-400 MG-UNIT TABS Take 1 tablet by mouth daily.    [provider]  cephALEXin (KEFLEX) 250 MG capsule Take 250 mg by mouth 4 (four) times daily.    [provider]  denosumab (PROLIA) 60 MG/ML SOSY injection Inject 60 mg into the skin every 6 (six) months.    [provider]  desmopressin (DDAVP) 0.2 MG tablet Take 3 tablets by mouth as needed (Uriniary tract).    [provider]  diltiazem (CARDIZEM CD) 180 MG 24 hr capsule TAKE 1 CAPSULE BY MOUTH EVERY DAY 02/15/23   Tolia, Sunit, DO  donepezil (ARICEPT) 5 MG tablet Take 1 tablet (5 mg total) by mouth at bedtime. 11/23/22   Sater, Pearletha Furl, MD  fluticasone (FLONASE) 50 MCG/ACT nasal spray Place 1 spray into the nose at bedtime.    [provider]  losartan (COZAAR) 100 MG tablet Take 50 mg by mouth every evening. 04/15/20   [provider]  meclizine (ANTIVERT) 25 MG tablet Take 25 mg by mouth daily as needed for dizziness.    [provider]  Melatonin 5 MG CAPS Take 10 mg by mouth at bedtime.  [provider]  Multiple Vitamin (MULTIVITAMIN WITH MINERALS) TABS tablet Take 1-2 tablets by mouth daily. flintstone vitamins Chewable with Iron    [provider]  multivitamin-iron-minerals-folic acid (CENTRUM) chewable tablet Chew 1 tablet by mouth daily.    [provider]  nitroGLYCERIN (NITROLINGUAL) 0.4 MG/SPRAY spray Place 1 spray under the tongue every 5 (five) minutes x 3 doses as needed for chest pain. 03/19/21   Cantwell, Celeste C, PA-C  Omega-3 1000 MG CAPS Take 2,000 mg by mouth daily.     [provider]  ondansetron (ZOFRAN) 4 MG tablet Take 4 mg by mouth every 8 (eight) hours as needed for nausea or vomiting.    [provider]  pantoprazole (PROTONIX) 40 MG tablet Take 40 mg by mouth daily before breakfast.  06/12/14   [provider]  Pediatric Multiple Vitamins (FLINTSTONES MULTIVITAMIN) CHEW Chew 1 tablet by mouth daily.    [provider]  Pediatric Multivit-Minerals (FLINTSTONES GUMMIES COMPLETE PO) Take by mouth.    [provider]  thyroid (ARMOUR) 60 MG tablet Take 60 mg by mouth daily before breakfast. 11/07/17   [provider]  vitamin C (ASCORBIC ACID) 500 MG tablet Take 500 mg by mouth daily.    [provider]      Allergies    Flecainide, Oxycodone, Trimethoprim, Alendronate sodium, Avelox [moxifloxacin hcl in nacl], Ciprofloxacin, and Other    Review of Systems   Review of Systems  Unable to perform ROS: Dementia  Neurological:  Positive for dizziness and syncope.    Physical Exam Updated Vital Signs BP 127/84   Pulse 66   Temp 99 F (37.2 C) (Oral)   Resp (!) 21   Ht 5\' 5"  (1.651 m)   Wt 68.9 kg   SpO2 96%   BMI 25.28 kg/m  Physical Exam Vitals and nursing note reviewed.  Constitutional:      General: She is not in acute distress.    Appearance: Normal appearance. She is well-developed. She is not ill-appearing, toxic-appearing or diaphoretic.  HENT:     Head: Normocephalic and atraumatic.     Right Ear: External ear normal.     Left Ear: External ear normal.     Nose: Nose normal.     Mouth/Throat:     Mouth: Mucous membranes are moist.  Eyes:     Extraocular Movements: Extraocular movements intact.     Conjunctiva/sclera: Conjunctivae normal.  Cardiovascular:     Rate and Rhythm: Normal rate and regular rhythm.     Heart sounds: No murmur heard. Pulmonary:     Effort: Pulmonary effort is normal. No respiratory distress.     Breath sounds: Normal breath sounds. No wheezing or rales.  Chest:     Chest wall: No tenderness.  Abdominal:     General: There is no distension.     Palpations: Abdomen is soft.      Tenderness: There is no abdominal tenderness.  Musculoskeletal:        General: No swelling. Normal range of motion.     Cervical back: Normal range of motion and neck supple.     Right lower leg: No edema.     Left lower leg: No edema.  Skin:    General: Skin is warm and dry.     Coloration: Skin is not jaundiced or pale.  Neurological:     General: No focal deficit present.     Mental Status: She is alert.  Cranial Nerves: No cranial nerve deficit.     Sensory: No sensory deficit.     Motor: No weakness.     Coordination: Coordination normal.  Psychiatric:        Mood and Affect: Mood normal.        Behavior: Behavior normal.     ED Results / Procedures / Treatments   Labs (all labs ordered are listed, but only abnormal results are displayed) Labs Reviewed  COMPREHENSIVE METABOLIC PANEL - Abnormal; Notable for the following components:      Result Value   CO2 20 (*)    Glucose, Bld 161 (*)    BUN 32 (*)    Creatinine, Ser 1.19 (*)    Calcium 8.4 (*)    Total Protein 5.6 (*)    Albumin 3.3 (*)    Alkaline Phosphatase 36 (*)    GFR, Estimated 46 (*)    All other components within normal limits  CBG MONITORING, ED - Abnormal; Notable for the following components:   Glucose-Capillary 170 (*)    All other components within normal limits  CBC WITH DIFFERENTIAL/PLATELET  MAGNESIUM  BRAIN NATRIURETIC PEPTIDE  URINALYSIS, ROUTINE W REFLEX MICROSCOPIC  TSH  TROPONIN I (HIGH SENSITIVITY)  TROPONIN I (HIGH SENSITIVITY)    EKG EKG Interpretation Date/Time:  Wednesday April 07 2023 12:10:02 EST Ventricular Rate:  70 PR Interval:  170 QRS Duration:  106 QT Interval:  480 QTC Calculation: 518 R Axis:   -70  Text Interpretation: Sinus rhythm Incomplete RBBB and LAFB Abnormal R-wave progression, late transition Nonspecific T abnormalities, lateral leads Prolonged QT interval Confirmed by Gloris Manchester [phone]) on 04/07/2023 12:21:54 PM  Radiology CT HEAD WO  CONTRAST Result Date: 04/07/2023 CLINICAL DATA:  Syncope/presyncope. Cerebrovascular cause suspected. Patient reports feeling dizzy over the last 8 hours. EXAM: CT HEAD WITHOUT CONTRAST TECHNIQUE: Contiguous axial images were obtained from the base of the skull through the vertex without intravenous contrast. RADIATION DOSE REDUCTION: This exam was performed according to the departmental dose-optimization program which includes automated exposure control, adjustment of the mA and/or kV according to patient size and/or use of iterative reconstruction technique. COMPARISON:  CT head without contrast 09/08/2022. MR head without and with contrast 12/15/2018 FINDINGS: Brain: Mild atrophy and white matter disease is stable. No acute infarct, hemorrhage, or mass lesion is present. The ventricles are proportionate to the degree of atrophy. No significant extraaxial fluid collection is present. Deep brain nuclei are within normal limits. Remote scratched at a remote lacunar infarct is present in the right cerebellum. The brainstem and cerebellum are otherwise within normal limits. Midline structures are within normal limits. Vascular: Minimal vascular calcifications are present within the cavernous internal carotid arteries bilaterally. No hyperdense vessels are present. Skull: Left wall up mastoidectomy and lateral suboccipital craniotomy is noted. The skull is otherwise intact. No significant extracranial soft tissue lesion is present. Sinuses/Orbits: Chronic right sphenoid sinus opacification and wall thickening is present. The paranasal sinuses and mastoid air cells are otherwise clear. The globes and orbits are within normal limits. IMPRESSION: 1. No acute intracranial abnormality or significant interval change. 2. Stable mild atrophy and white matter disease. This likely reflects the sequela of chronic microvascular ischemia. 3. Remote lacunar infarct of the right cerebellum. 4. Left wall up mastoidectomy and lateral  suboccipital craniotomy. 5. Chronic right sphenoid sinus disease. Electronically Signed   By: Marin Roberts M.D.   On: 04/07/2023 13:34    Procedures Procedures    Medications Ordered in ED  Medications  lactated ringers bolus 500 mL (has no administration in time range)  lactated ringers bolus 500 mL (0 mLs Intravenous Stopped 04/07/23 1611)    ED Course/ Medical Decision Making/ A&P Clinical Course as of 04/07/23 1612  Wed Apr 07, 2023  1555 Received signout from Dr. Durwin Nora pending CTA chest, repeat troponin, urinalysis. Had syncope leaving [employer]. [WS]    Clinical Course User Index [WS] Lonell Grandchild, MD                                 Medical Decision Making Amount and/or Complexity of Data Reviewed Labs: ordered. Radiology: ordered. ECG/medicine tests: ordered.   This patient presents to the ED for concern of syncope, this involves an extensive number of treatment options, and is a complaint that carries with it a high risk of complications and morbidity.  The differential diagnosis includes arrhythmia, vasovagal episode, polypharmacy, dehydration, anemia, other metabolic derangements   Co morbidities that complicate the patient evaluation  HTN, HLD, GERD, arthritis, GI bleed, sleep apnea, atrial fibrillation, CAD, and ascending aortic aneurysm   Additional history obtained:  Additional history obtained from EMS, patient's husband External records from outside source obtained and reviewed including EMR   Lab Tests:  I Ordered, and personally interpreted labs.  The pertinent results include: Creatinine mildly increased at baseline, normal electrolytes, normal hemoglobin, no leukocytosis, normal troponin and BNP   Imaging Studies ordered:  I ordered imaging studies including CT head, CTA chest I independently visualized and interpreted imaging which showed no acute findings on CT head.  CTA pending at time of signout. I agree with the radiologist  interpretation   Cardiac Monitoring: / EKG:  The patient was maintained on a cardiac monitor.  I personally viewed and interpreted the cardiac monitored which showed an underlying rhythm of: Sinus rhythm   Problem List / ED Course / Critical interventions / Medication management  Patient presents after syncopal episode.  On arrival, she is well-appearing.  She has no focal neurologic deficits on exam.  She denies any current symptoms.  Workup was initiated.  History from patient was limited due to what appears to be short-term memory loss.  She is unable to say how she felt this morning or what symptoms she might of experience as a prodrome.  Will obtain further corroborating information from family when they arrive.  Patient's husband arrived and provided further history.  LOC actually occurred while walking with her walker, although she was able to be eased into a booth.  She did not fall.  LOC lasted for 30 seconds.  Labs showed creatinine increase from baseline.  IV fluids were ordered.  Remaining lab work was unremarkable.  CT was pending at time of signout.  Care of patient was signed out to oncoming ED provider. I ordered medication including IV fluids for hydration Reevaluation of the patient after these medicines showed that the patient improved I have reviewed the patients home medicines and have made adjustments as needed   Social Determinants of Health:  Lives at home with family        Final Clinical Impression(s) / ED Diagnoses Final diagnoses:  Syncope, unspecified syncope type    Rx / DC Orders ED Discharge Orders     None         Gloris Manchester, MD 04/07/23 1612

## 2023-04-07 NOTE — Progress Notes (Signed)
Patient arrived to the unit from ED, AO x4. Denies any pain or discomfort, breathing even and unlabored in RA. CHG wipe completed, connected to tele and CCMD notified. Oriented patient to room and call bell system. Instructed pt to call for assistance when getting OOB, bed alarm on. Call bell within reach. Plan of care continues.

## 2023-04-07 NOTE — ED Triage Notes (Signed)
PT BIB [facility] from [facility]. PT had been feeling dizzy for the past 8 hrs and had syncopal episodes as well but didn't pass out until after eating.PT doesn't know how long she was out. PT has had syncopal episodes before.PT reports no pain at this time. PT is on Eliquis but doesn't report hitting her head.

## 2023-04-07 NOTE — ED Notes (Signed)
PT is ambulatory to the bathroom with assistance, PT is reporting being wobbly but no dizziness.

## 2023-04-07 NOTE — H&P (Signed)
History and Physical    Cynthia [name] RUE:[id number] DOB: 11/23/1940 DOA: 04/07/2023  PCP: Merri Brunette, MD   Chief Complaint: syncope  HPI: Cynthia [name] is a 82 y.o. female with medical history significant of hypertension, hyperlipidemia, GERD, A-fib, CAD, ascending aortic aneurysm presents emergency department with vasovagal syncopal episode.  Patient sees cardiology and is currently on Eliquis amiodarone and Cardizem for paroxysmal A-fib.  She also had a loop recorder which did not show any recurrent A-fib.  She is been having ongoing dizziness and poor p.o. intake.  This morning she was at breakfast when she stood up to leave the restaurant became dizzy and had a syncopal episode.  She fell to the ground and EMS was called.  She regained consciousness and presented to the ER for further assessment.  On arrival she was afebrile stable.  Labs were obtained which did 1 strata WBC 6.5, hemoglobin 12.7, creatinine 1.1, troponin within normal limits, BNP 53.  CT pulmonary embolism study was obtained which showed no acute PE without any other changes.  CT head showed no acute abnormalities however remote infarcts.  Patient was given IV fluids and admitted for further observation of her syncope.  Of note last echocardiogram was obtained several years ago.  She endorses prodromal state.  She denies of any infectious complaints.   Review of Systems: Review of Systems  Constitutional:  Negative for chills and fever.  HENT: Negative.    Eyes: Negative.   Respiratory: Negative.    Cardiovascular: Negative.  Negative for chest pain.  Gastrointestinal:  Negative for heartburn and nausea.  Genitourinary: Negative.   Musculoskeletal: Negative.   Skin:  Negative for itching and rash.  Neurological: Negative.   Endo/Heme/Allergies: Negative.   Psychiatric/Behavioral: Negative.    All other systems reviewed and are negative.    As per HPI otherwise 10 point review of systems negative.    Allergies  Allergen Reactions   Flecainide Other (See Comments)    Severe dizziness   Oxycodone Other (See Comments)    uphoria   Trimethoprim Other (See Comments)    Other reaction(s): hives   Alendronate Sodium Nausea Only   Avelox [Moxifloxacin Hcl In Nacl] Other (See Comments)    Unknown reaction   Ciprofloxacin Other (See Comments)    Other reaction(s): multiple medication interactions   Other Other (See Comments)    Avelox+cymbalta Together Other reaction(s): flu like symptoms    Past Medical History:  Diagnosis Date   Acute GI bleeding 10/27/2017   Arthritis    "joints" (11/10/2017)   Dizziness    Dysrhythmia    afib   Encounter for loop recorder check 06/09/2020   GERD (gastroesophageal reflux disease)    GI bleed 11/06/2017   Hearing loss    History of blood transfusion 10/2017   Hyperlipidemia    Hypertension    Hyperthyroidism    Loop recorder Biotronik Loop Recorder 05/23/2020 05/23/2020   Melanoma (HCC)    "cut off my back"   Memory loss    Pneumonia    Sleep apnea    cpap   Syncope and collapse     Past Surgical History:  Procedure Laterality Date   ABDOMINAL HYSTERECTOMY     "partial"   APPENDECTOMY     BIOPSY  11/17/2017   Procedure: BIOPSY;  Surgeon: Vida Rigger, MD;  Location: [facility];  Service: Endoscopy;;   COLONOSCOPY WITH PROPOFOL N/A 11/17/2017   Procedure: COLONOSCOPY WITH PROPOFOL;  Surgeon: Vida Rigger, MD;  Location:  [facility];  Service: Endoscopy;  Laterality: N/A;   CRANIECTOMY FOR EXCISION OF ACOUSTIC NEUROMA     ESOPHAGOGASTRODUODENOSCOPY (EGD) WITH PROPOFOL N/A 10/28/2017   Procedure: ESOPHAGOGASTRODUODENOSCOPY (EGD) WITH PROPOFOL;  Surgeon: Vida Rigger, MD;  Location: [facility];  Service: Endoscopy;  Laterality: N/A;   FOOT SURGERY Bilateral 04/18/2020   KNEE ARTHROPLASTY Right 06/05/2021   Procedure: COMPUTER ASSISTED TOTAL KNEE ARTHROPLASTY;  Surgeon: Samson Frederic, MD;  Location: [facility];  Service: Orthopedics;   Laterality: Right;   KNEE ARTHROSCOPY Left    LEFT HEART CATH AND CORONARY ANGIOGRAPHY N/A 11/24/2016   Procedure: LEFT HEART CATH AND CORONARY ANGIOGRAPHY;  Surgeon: Corky Crafts, MD;  Location: [facility] INVASIVE CV LAB;  Service: Cardiovascular;  Laterality: N/A;   MELANOMA EXCISION     "off my back"   NASAL SINUS SURGERY     TONSILLECTOMY       reports that she quit smoking about 45 years ago. Her smoking use included cigarettes. She started smoking about 47 years ago. She has never used smokeless tobacco. She reports current alcohol use of about 1.0 standard drink of alcohol per week. She reports that she does not use drugs.  Family History  Problem Relation Age of Onset   Congestive Heart Failure Mother    Heart disease Father        History of heart attacks at a later age.     Heart disease Brother    Hearing loss Brother     Prior to Admission medications   Medication Sig Start Date End Date Taking? Authorizing Provider  acetaminophen (TYLENOL) 500 MG tablet Take 1,000 mg by mouth at bedtime.   Yes [provider]  Alpha-Lipoic Acid (LIPOIC ACID PO) Take 600 mg by mouth daily.   Yes [provider]  amiodarone (PACERONE) 200 MG tablet Take 1 tablet (200 mg total) by mouth daily. 09/10/21  Yes Cantwell, Celeste C, PA-C  apixaban (ELIQUIS) 5 MG TABS tablet Take 1 tablet (5 mg total) by mouth 2 (two) times daily. 02/15/23  Yes Tolia, Sunit, DO  atorvastatin (LIPITOR) 10 MG tablet Take 1 tablet (10 mg total) by mouth daily. 08/13/22  Yes Custovic, Sabina, DO  AZO-CRANBERRY PO Take 1 tablet by mouth daily.   Yes [provider]  Calcium Carbonate-Vitamin D3 600-400 MG-UNIT TABS Take 1 tablet by mouth daily.   Yes [provider]  cephALEXin (KEFLEX) 250 MG capsule Take 250 mg by mouth 4 (four) times daily.   Yes [provider]  denosumab (PROLIA) 60 MG/ML SOSY injection Inject 60 mg into the skin every 6 (six) months.   Yes [provider]  desmopressin (DDAVP) 0.2 MG tablet Take 3 tablets by mouth as needed (Uriniary tract).   Yes [provider]  diltiazem (CARDIZEM CD) 180 MG 24 hr capsule TAKE 1 CAPSULE BY MOUTH EVERY DAY 02/15/23  Yes Tolia, Sunit, DO  donepezil (ARICEPT) 5 MG tablet Take 1 tablet (5 mg total) by mouth at bedtime. 11/23/22  Yes Sater, Pearletha Furl, MD  fluticasone (FLONASE) 50 MCG/ACT nasal spray Place 1 spray into the nose at bedtime as needed for allergies.   Yes [provider]  losartan (COZAAR) 100 MG tablet Take 50 mg by mouth every evening. 04/15/20  Yes [provider]  meclizine (ANTIVERT) 25 MG tablet Take 25 mg by mouth daily as needed for dizziness.   Yes [provider]  Melatonin 5 MG CAPS Take 10 mg by mouth at bedtime.  Yes [provider]  multivitamin-iron-minerals-folic acid (CENTRUM) chewable tablet Chew 1 tablet by mouth daily.   Yes [provider]  nitroGLYCERIN (NITROLINGUAL) 0.4 MG/SPRAY spray Place 1 spray under the tongue every 5 (five) minutes x 3 doses as needed for chest pain. 03/19/21  Yes Cantwell, Celeste C, PA-C  Omega-3 1000 MG CAPS Take 2,000 mg by mouth daily.    Yes [provider]  ondansetron (ZOFRAN) 4 MG tablet Take 4 mg by mouth every 8 (eight) hours as needed for nausea or vomiting.   Yes [provider]  pantoprazole (PROTONIX) 40 MG tablet Take 40 mg by mouth daily before breakfast.  06/12/14  Yes [provider]  Pediatric Multiple Vitamins (FLINTSTONES MULTIVITAMIN) CHEW Chew 1 tablet by mouth daily.   Yes [provider]  sertraline (ZOLOFT) 25 MG tablet Take 25 mg by mouth daily. 03/28/23  Yes [provider]  thyroid (ARMOUR) 60 MG tablet Take 60 mg by mouth daily before breakfast. 11/07/17  Yes [provider]  traMADol (ULTRAM) 50 MG tablet Take 50 mg by mouth every 6 (six) hours as needed. 03/25/23  Yes [provider]  vitamin C (ASCORBIC  ACID) 500 MG tablet Take 500 mg by mouth daily.   Yes [provider]    Physical Exam: Vitals:   04/07/23 1213 04/07/23 1540 04/07/23 1613 04/07/23 1850  BP: 101/68 127/84  (!) 136/57  Pulse: 71 66  70  Resp: (!) 21 (!) 21  19  Temp:   99.7 F (37.6 C)   TempSrc: Oral  Oral   SpO2: 99% 96%  93%  Weight:      Height:       Physical Exam Vitals reviewed.  Constitutional:      Appearance: She is normal weight.  HENT:     Head: Normocephalic.     Nose: Nose normal.     Mouth/Throat:     Mouth: Mucous membranes are moist.     Pharynx: Oropharynx is clear.  Eyes:     Pupils: Pupils are equal, round, and reactive to light.  Cardiovascular:     Rate and Rhythm: Normal rate and regular rhythm.     Pulses: Normal pulses.     Heart sounds: Normal heart sounds.  Pulmonary:     Effort: Pulmonary effort is normal.     Breath sounds: Normal breath sounds.  Abdominal:     General: Abdomen is flat. Bowel sounds are normal.  Musculoskeletal:        General: Normal range of motion.  Skin:    General: Skin is warm.     Capillary Refill: Capillary refill takes less than 2 seconds.  Neurological:     General: No focal deficit present.     Mental Status: She is alert. Mental status is at baseline.  Psychiatric:        Mood and Affect: Mood normal.        Labs on Admission: I have personally reviewed the patients's labs and imaging studies.  Assessment/Plan Principal Problem:   Syncope   # Syncope, most likely vasovagal, POA, active - Patient endorses standing feeling dizzy with subsequent syncopal episode - Patient has history of A-fib underwent loop recorder several months ago which did not show any events  PLAN: Order TTE Continue IV fluids Check labs in morning PT/OT  # Paroxysmal A-fib, POA, active - Continue home Eliquis, amiodarone, diltiazem  # Cognitive impairment-continue donepezil  # Hypertension-continue losartan  # Depression-continue  Zoloft  # GERD-continue Protonix  Admission status: Observation Telemetry Medical  Certification: The appropriate patient status for this patient is OBSERVATION. Observation status is judged to be reasonable and necessary in order to provide the required intensity of service to ensure the patient's safety. The patient's presenting symptoms, physical exam findings, and initial radiographic and laboratory data in the context of their medical condition is felt to place them at decreased risk for further clinical deterioration. Furthermore, it is anticipated that the patient will be medically stable for discharge from the hospital within 2 midnights of admission.     Alan Mulder MD [facility] If 7PM-7AM, please contact night-coverage [email or web address]  04/07/2023, 7:49 PM

## 2023-04-08 ENCOUNTER — Observation Stay (HOSPITAL_COMMUNITY): Payer: Medicare PPO

## 2023-04-08 ENCOUNTER — Encounter (HOSPITAL_COMMUNITY): Payer: Self-pay | Admitting: Internal Medicine

## 2023-04-08 DIAGNOSIS — I10 Essential (primary) hypertension: Secondary | ICD-10-CM

## 2023-04-08 DIAGNOSIS — I480 Paroxysmal atrial fibrillation: Secondary | ICD-10-CM | POA: Diagnosis not present

## 2023-04-08 DIAGNOSIS — R55 Syncope and collapse: Secondary | ICD-10-CM | POA: Diagnosis not present

## 2023-04-08 LAB — CBC
HCT: 36.6 % (ref 36.0–46.0)
Hemoglobin: 12.1 g/dL (ref 12.0–15.0)
MCH: 31.7 pg (ref 26.0–34.0)
MCHC: 33.1 g/dL (ref 30.0–36.0)
MCV: 95.8 fL (ref 80.0–100.0)
Platelets: 215 10*3/uL (ref 150–400)
RBC: 3.82 MIL/uL — ABNORMAL LOW (ref 3.87–5.11)
RDW: 12.9 % (ref 11.5–15.5)
WBC: 9.3 10*3/uL (ref 4.0–10.5)
nRBC: 0 % (ref 0.0–0.2)

## 2023-04-08 LAB — BASIC METABOLIC PANEL
Anion gap: 9 (ref 5–15)
BUN: 30 mg/dL — ABNORMAL HIGH (ref 8–23)
CO2: 24 mmol/L (ref 22–32)
Calcium: 8.6 mg/dL — ABNORMAL LOW (ref 8.9–10.3)
Chloride: 106 mmol/L (ref 98–111)
Creatinine, Ser: 1.03 mg/dL — ABNORMAL HIGH (ref 0.44–1.00)
GFR, Estimated: 55 mL/min — ABNORMAL LOW (ref >60)
Glucose, Bld: 111 mg/dL — ABNORMAL HIGH (ref 70–99)
Potassium: 3.5 mmol/L (ref 3.5–5.1)
Sodium: 139 mmol/L (ref 135–145)

## 2023-04-08 LAB — ECHOCARDIOGRAM COMPLETE
AR max vel: 1.73 cm2
AV Area VTI: 2.1 cm2
AV Area mean vel: 1.9 cm2
AV Mean grad: 3 mm[Hg]
AV Peak grad: 8.2 mm[Hg]
Ao pk vel: 1.43 m/s
Area-P 1/2: 2.58 cm2
Calc EF: 59.5 %
Height: 65 in
MV VTI: 1.94 cm2
S' Lateral: 3 cm
Single Plane A2C EF: 54.3 %
Single Plane A4C EF: 63.9 %
Weight: 2430.35 [oz_av]

## 2023-04-08 MED ORDER — DESMOPRESSIN ACETATE 0.1 MG PO TABS: 0.2000 mg | ORAL_TABLET | Freq: Every day | ORAL | Status: DC | PRN

## 2023-04-08 NOTE — Care Management Obs Status (Cosign Needed)
MEDICARE OBSERVATION STATUS NOTIFICATION   Patient Details  Name: Cynthia [name] MRN: [id number] Date of Birth: 10-05-1940   Medicare Observation Status Notification Given:  Yes    Janae Bridgeman, RN 04/08/2023, 3:07 PM

## 2023-04-08 NOTE — Progress Notes (Signed)
Transition of Care [facility]) - Inpatient Brief Assessment   Patient Details  Name: Cynthia [name] MRN: [id number] Date of Birth: January 02, 1941  Transition of Care [facility]) CM/SW Contact:    Janae Bridgeman, RN Phone Number: 04/08/2023, 3:15 PM   Clinical Narrative: CM met with the patient at the bedside and provided the patient with Medicare Obs letter.  Patient lives at home with husband and has CPAP on RA, RW, Cane and WC.  No other TOC needs at this time and patient will likely be discharged to home with husband today.   Transition of Care Asessment: Insurance and Status: (P) Insurance coverage has been reviewed Patient has primary care physician: (P) Yes Home environment has been reviewed: (P) from home with husband Prior level of function:: (P) Independent Prior/Current Home Services: (P) No current home services Social Drivers of Health Review: (P) SDOH reviewed interventions complete Readmission risk has been reviewed: (P) Yes Transition of care needs: (P) no transition of care needs at this time

## 2023-04-08 NOTE — Evaluation (Signed)
Physical Therapy Evaluation Patient Details Name: Cynthia [name] MRN: [id number] DOB: 03/18/1941 Today's Date: 04/08/2023  History of Present Illness  Pt is 82 yo presenting to [facility] ER with vasovagal syncopal episode. PMH: HTN, Hyperlipidemia, GERD, A-fib, CAD, ascending aortic aneurysm.  Clinical Impression  Pt is presenting close to baseline level of functioning. Pt is Mod I for bed mobility, sit to stand and gait with RW.  Pt was previously attending OPPT and would benefit from returning. Currently pt is presenting at baseline level of functioning and no skilled physical therapy services recommended. Pt will be discharged from skilled physical therapy services at this time; please re-consult if further needs arise.          If plan is discharge home, recommend the following: Help with stairs or ramp for entrance;Assist for transportation;Assistance with cooking/housework     Equipment Recommendations None recommended by PT     Functional Status Assessment Patient has had a recent decline in their functional status and/or demonstrates limited ability to make significant improvements in function in a reasonable and predictable amount of time     Precautions / Restrictions Precautions Precautions: None Restrictions Weight Bearing Restrictions Per Provider Order: No      Mobility  Bed Mobility Overal bed mobility: Modified Independent    Transfers Overall transfer level: Modified independent Equipment used: Rolling walker (2 wheels)      Ambulation/Gait Ambulation/Gait assistance: Modified independent (Device/Increase time) Gait Distance (Feet): 200 Feet Assistive device: Rolling walker (2 wheels) Gait Pattern/deviations: Step-through pattern, Trunk flexed, Antalgic, Decreased stance time - left Gait velocity: decreased Gait velocity interpretation: 1.31 - 2.62 ft/sec, indicative of limited community ambulator   General Gait Details: Pt has decreased stance time on the L due  to pain in the L hip and knee.  Stairs Stairs:  (discussed how to perform stairs per home set up. Pt states she can use SPC and use a post that is on the second step. She takes steps one at a time.)             Balance Overall balance assessment: Mild deficits observed, not formally tested         Pertinent Vitals/Pain Pain Assessment Pain Assessment: No/denies pain    Home Living Family/patient expects to be discharged to:: Private residence Living Arrangements: Spouse/significant other;Children (son) Available Help at Discharge: Family;Available PRN/intermittently;Neighbor Type of Home: House Home Access: Stairs to enter Entrance Stairs-Rails: None Entrance Stairs-Number of Steps: 2   Home Layout: One level Home Equipment: Shower seat - built [employer] fundraiser (2 wheels);Wheelchair - manual;Cane - single point;Grab bars - tub/shower      Prior Function Prior Level of Function : Independent/Modified Independent             Mobility Comments: Pt states that she ambulates with RW mostly but will sometimes go without in the house. She states that she has not had falls in 6 months. ADLs Comments: Reports ind with ADL's and IADLs. Daughter helps with medication     Extremity/Trunk Assessment   Upper Extremity Assessment Upper Extremity Assessment: Overall WFL for tasks assessed    Lower Extremity Assessment Lower Extremity Assessment: Overall WFL for tasks assessed    Cervical / Trunk Assessment Cervical / Trunk Assessment: Normal  Communication   Communication Communication: No apparent difficulties  Cognition Arousal: Alert Behavior During Therapy: WFL for tasks assessed/performed Overall Cognitive Status: Within Functional Limits for tasks assessed     General Comments: reports sometime she L knee gives  her pain, LB and R hip        General Comments General comments (skin integrity, edema, etc.): No noted skin issues outside of gown. Pt was on room  air with HR WNL.        Assessment/Plan    PT Assessment All further PT needs can be met in the next venue of care  PT Problem List Decreased strength;Pain;Decreased balance;Decreased activity tolerance           PT Goals (Current goals can be found in the Care Plan section)  Acute Rehab PT Goals PT Goal Formulation: All assessment and education complete, DC therapy            AM-PAC PT "6 Clicks" Mobility  Outcome Measure Help needed turning from your back to your side while in a flat bed without using bedrails?: None Help needed moving from lying on your back to sitting on the side of a flat bed without using bedrails?: None Help needed moving to and from a bed to a chair (including a wheelchair)?: None Help needed standing up from a chair using your arms (e.g., wheelchair or bedside chair)?: None Help needed to walk in hospital room?: None Help needed climbing 3-5 steps with a railing? : A Little 6 Click Score: 23    End of Session Equipment Utilized During Treatment: Gait belt Activity Tolerance: Patient tolerated treatment well Patient left: in bed;with call bell/phone within reach Nurse Communication: Mobility status PT Visit Diagnosis: Other abnormalities of gait and mobility (R26.89);Unsteadiness on feet (R26.81);Difficulty in walking, not elsewhere classified (R26.2)    Time: 0981-1914 PT Time Calculation (min) (ACUTE ONLY): 25 min   Charges:   PT Evaluation $PT Eval Low Complexity: 1 Low PT Treatments $Therapeutic Activity: 8-22 mins PT General Charges $$ ACUTE PT VISIT: 1 Visit       Harrel Carina, DPT, CLT  Acute Rehabilitation Services Office: [phone] (Secure chat preferred)   Cynthia [name] 04/08/2023, 3:44 PM

## 2023-04-08 NOTE — Progress Notes (Signed)
OT Cancellation Note  Patient Details Name: Cynthia [name] MRN: [id number] DOB: 11-01-1940   Cancelled Treatment:    Reason Eval/Treat Not Completed: OT screened, no needs identified, will sign off  Evern Bio 04/08/2023, 3:35 PM Berna Spare, OTR/L Acute Rehabilitation Services Office: [phone]

## 2023-04-08 NOTE — Progress Notes (Signed)
  Echocardiogram 2D Echocardiogram has been performed.  Ocie Doyne RDCS 04/08/2023, 9:13 AM

## 2023-04-08 NOTE — Plan of Care (Signed)

## 2023-04-08 NOTE — Progress Notes (Signed)
   Loop recorder interrogated by EP staff.  There were no episodes of HR < 50, HR > 130  No Afib, no evidence of chronotropic incompetence, no sig arrhythmias.   F/u as scheduled.   Theodore Demark, PA-C 04/08/2023 2:27 PM

## 2023-04-08 NOTE — Discharge Summary (Signed)
Physician Discharge Summary   Patient: Cynthia [name] MRN: [id number] DOB: [birth year]  Admit date:     04/07/2023  Discharge date: 04/08/23  Discharge Physician: Tyrone Nine   PCP: Merri Brunette, MD   Recommendations at discharge:  Follow up with PCP and/or cardiology (sooner appt than currently scheduled 05/21/2023) for repeat BP evaluation.    Discharge Diagnoses: Principal Problem:   Syncope  Hospital Course: Saniya Lafont is a 82 y.o. female with medical history significant of hypertension, hyperlipidemia, GERD, A-fib, CAD, ascending aortic aneurysm presents emergency department with vasovagal syncopal episode.  Patient sees cardiology and is currently on Eliquis amiodarone and Cardizem for paroxysmal A-fib.  She also had a loop recorder which did not show any recurrent A-fib.  She is been having ongoing dizziness and poor p.o. intake.  This morning she was at breakfast when she stood up to leave the restaurant became dizzy and had a syncopal episode.  She fell to the ground and EMS was called.  She regained consciousness and presented to the ER for further assessment.  On arrival she was afebrile stable.  Labs were obtained which did 1 strata WBC 6.5, hemoglobin 12.7, creatinine 1.1, troponin within normal limits, BNP 53.  CT pulmonary embolism study was obtained which showed no acute PE without any other changes.  CT head showed no acute abnormalities however remote infarcts.  Patient was given IV fluids and admitted for further observation of her syncope.   Of note last echocardiogram was obtained several years ago.  She endorses prodromal state.  She denies of any infectious complaints.  She was admitted for observation and evaluated by cardiology. Loop recorder was interrogated showing no episodes of HR <50 or >130bpm, no AFib, no evidence of chronotropic incompetence or significant arrhythmias. No further work up is planned as the patient feels at her baseline and will attempt more  consistent hydration to avoid recurrent symptoms.   Assessment and Plan: Orthostatic syncope/dehydration: Echo showed no severe abnormalities (LVEF 60-65%, no RWMA, indeterminate diastolic parameters, normal RV, mild LAE, mild MR, calcific but functional AV.  - Encourage oral hydration.  - Monitor BP at home, take records of supine BP to PCP in the next 1-2 weeks  - Cautioned against sudden changes in position.  - Recommend compression stockings and abdominal binder - Per cardiology, Dr. Antoine Poche: If she continues to have these events we might need to treat her with either fludrocortisone or midodrine. However, I do not think she needs to have this started at this time.   - Can continue prn meclizine which is a chronic medication.  PAF:  - Pt has not been continued on amiodarone, not needed to restart here per cardiology, continue eliquis and diltiazem  HTN: Continue PTA medications.   Depression: Continue SSRI  GERD: Continue PPI  Cognitive impairment: Continue aricept. Spouse at bedside throughout encounter.  Hypothyroidism: Continue supplement. TSH is wnl at 0.646.   Stage IIIb CKD: Stable. Advised to avoid home DDAVP when CrCl is below 31ml/min  Consultants: Cardiology Procedures performed: None  Disposition: Home Diet recommendation:  Cardiac diet DISCHARGE MEDICATION: Allergies as of 04/08/2023       Reactions   Flecainide Other (See Comments)   Severe dizziness   Oxycodone Other (See Comments)   uphoria   Trimethoprim Other (See Comments)   Other reaction(s): hives   Alendronate Sodium Nausea Only   Avelox [moxifloxacin Hcl In Nacl] Other (See Comments)   Unknown reaction   Ciprofloxacin Other (See  Comments)   Other reaction(s): multiple medication interactions   Other Other (See Comments)   Avelox+cymbalta Together Other reaction(s): flu like symptoms        Medication List     STOP taking these medications    amiodarone 200 MG tablet Commonly known  as: PACERONE   cephALEXin 250 MG capsule Commonly known as: KEFLEX       TAKE these medications    acetaminophen 500 MG tablet Commonly known as: TYLENOL Take 1,000 mg by mouth at bedtime.   apixaban 5 MG Tabs tablet Commonly known as: Eliquis Take 1 tablet (5 mg total) by mouth 2 (two) times daily.   ascorbic acid 500 MG tablet Commonly known as: VITAMIN C Take 500 mg by mouth daily.   atorvastatin 10 MG tablet Commonly known as: LIPITOR Take 1 tablet (10 mg total) by mouth daily.   AZO-CRANBERRY PO Take 1 tablet by mouth daily.   Calcium Carbonate-Vitamin D3 600-400 MG-UNIT Tabs Take 1 tablet by mouth daily.   denosumab 60 MG/ML Sosy injection Commonly known as: PROLIA Inject 60 mg into the skin every 6 (six) months.   desmopressin 0.2 MG tablet Commonly known as: DDAVP Take 3 tablets by mouth as needed (Uriniary tract).   diltiazem 180 MG 24 hr capsule Commonly known as: CARDIZEM CD TAKE 1 CAPSULE BY MOUTH EVERY DAY   donepezil 5 MG tablet Commonly known as: ARICEPT Take 1 tablet (5 mg total) by mouth at bedtime.   Flintstones Multivitamin Chew Chew 1 tablet by mouth daily.   fluticasone 50 MCG/ACT nasal spray Commonly known as: FLONASE Place 1 spray into the nose at bedtime as needed for allergies.   LIPOIC ACID PO Take 600 mg by mouth daily.   losartan 100 MG tablet Commonly known as: COZAAR Take 50 mg by mouth every evening.   meclizine 25 MG tablet Commonly known as: ANTIVERT Take 25 mg by mouth daily as needed for dizziness.   Melatonin 5 MG Caps Take 10 mg by mouth at bedtime.   multivitamin-iron-minerals-folic acid chewable tablet Chew 1 tablet by mouth daily.   nitroGLYCERIN 0.4 MG/SPRAY spray Commonly known as: NITROLINGUAL Place 1 spray under the tongue every 5 (five) minutes x 3 doses as needed for chest pain.   Omega-3 1000 MG Caps Take 2,000 mg by mouth daily.   ondansetron 4 MG tablet Commonly known as: ZOFRAN Take 4 mg  by mouth every 8 (eight) hours as needed for nausea or vomiting.   pantoprazole 40 MG tablet Commonly known as: PROTONIX Take 40 mg by mouth daily before breakfast.   sertraline 25 MG tablet Commonly known as: ZOLOFT Take 25 mg by mouth daily.   thyroid 60 MG tablet Commonly known as: ARMOUR Take 60 mg by mouth daily before breakfast.   traMADol 50 MG tablet Commonly known as: ULTRAM Take 50 mg by mouth every 6 (six) hours as needed.        Follow-up Information     Merri Brunette, MD Follow up.   Specialty: Internal Medicine Contact information: [address] [place] [address] [place] [address] [phone]         Yates Decamp, MD Follow up.   Specialty: Cardiology Contact information: [address] [place] [address] [phone]                Discharge Exam: Ceasar Mons Weights   04/07/23 1210  Weight: 68.9 kg  BP (!) 151/74 (BP Location: Right Arm)   Pulse  63   Temp 98.1 F (36.7 C) (Oral)   Resp 20   Ht 5\' 5"  (1.651 m)   Wt 68.9 kg   SpO2 99%   BMI 25.28 kg/m   Well-appearing elderly female in no acute distress. Clear, nonlabored RRR, soft I/VI systolic murmur over LSB, trace pitting symmetric LE edema Alert, nonfocal.  Condition at discharge: stable  The results of significant diagnostics from this hospitalization (including imaging, microbiology, ancillary and laboratory) are listed below for reference.   Imaging Studies: ECHOCARDIOGRAM COMPLETE Result Date: 04/08/2023    ECHOCARDIOGRAM REPORT   Patient Name:   Totiana [name] Olheiser Date of Exam: 04/08/2023 Medical Rec #:  [id number]      Height:       65.0 in Accession #:    [id number]     Weight:       151.9 lb Date of Birth:  09-Mar-1941      BSA:          1.760 m Patient Age:    82 years       BP:           154/79 mmHg Patient Gender: F              HR:           60 bpm. Exam Location:  Inpatient Procedure: 2D Echo, Cardiac Doppler and Color Doppler Indications:     Syncope  History:        Patient has prior history of Echocardiogram examinations, most                 recent 12/22/2020. Arrythmias:Atrial Fibrillation,                 Signs/Symptoms:Syncope; Risk Factors:Sleep Apnea, Hypertension                 and Dyslipidemia.  Sonographer:    Vern Claude Referring Phys: [id number] ROBERT DORRELL IMPRESSIONS  1. Left ventricular ejection fraction, by estimation, is 60 to 65%. The left ventricle has normal function. The left ventricle has no regional wall motion abnormalities. Left ventricular diastolic parameters are indeterminate.  2. Right ventricular systolic function is normal. The right ventricular size is normal. There is normal pulmonary artery systolic pressure.  3. Left atrial size was mildly dilated.  4. The mitral valve is normal in structure. Mild mitral valve regurgitation. No evidence of mitral stenosis. The mean mitral valve gradient is 2.0 mmHg. Severe mitral annular calcification.  5. The aortic valve is calcified. There is moderate calcification of the aortic valve. There is moderate thickening of the aortic valve. Aortic valve regurgitation is not visualized. Aortic valve sclerosis is present, with no evidence of aortic valve stenosis. Aortic valve mean gradient measures 3.0 mmHg. Aortic valve Vmax measures 1.43 m/s.  6. The inferior vena cava is normal in size with greater than 50% respiratory variability, suggesting right atrial pressure of 3 mmHg. FINDINGS  Left Ventricle: Left ventricular ejection fraction, by estimation, is 60 to 65%. The left ventricle has normal function. The left ventricle has no regional wall motion abnormalities. The left ventricular internal cavity size was normal in size. There is  no left ventricular hypertrophy. Left ventricular diastolic parameters are indeterminate. Right Ventricle: The right ventricular size is normal. No increase in right ventricular wall thickness. Right ventricular systolic function is normal. There is  normal pulmonary artery systolic pressure. The tricuspid regurgitant velocity is 2.30 m/s, and  with an assumed right atrial pressure of 3  mmHg, the estimated right ventricular systolic pressure is 24.2 mmHg. Left Atrium: Left atrial size was mildly dilated. Right Atrium: Right atrial size was normal in size. Pericardium: There is no evidence of pericardial effusion. Mitral Valve: The mitral valve is normal in structure. Severe mitral annular calcification. Mild mitral valve regurgitation. No evidence of mitral valve stenosis. MV peak gradient, 5.7 mmHg. The mean mitral valve gradient is 2.0 mmHg. Tricuspid Valve: The tricuspid valve is normal in structure. Tricuspid valve regurgitation is trivial. No evidence of tricuspid stenosis. Aortic Valve: The aortic valve is calcified. There is moderate calcification of the aortic valve. There is moderate thickening of the aortic valve. Aortic valve regurgitation is not visualized. Aortic valve sclerosis is present, with no evidence of aortic valve stenosis. Aortic valve mean gradient measures 3.0 mmHg. Aortic valve peak gradient measures 8.2 mmHg. Aortic valve area, by VTI measures 2.10 cm. Pulmonic Valve: The pulmonic valve was normal in structure. Pulmonic valve regurgitation is trivial. No evidence of pulmonic stenosis. Aorta: The aortic root is normal in size and structure. Venous: The inferior vena cava is normal in size with greater than 50% respiratory variability, suggesting right atrial pressure of 3 mmHg. IAS/Shunts: No atrial level shunt detected by color flow Doppler.  LEFT VENTRICLE PLAX 2D LVIDd:         5.30 cm      Diastology LVIDs:         3.00 cm      LV e' medial:    6.68 cm/s LV PW:         0.80 cm      LV E/e' medial:  8.8 LV IVS:        0.70 cm      LV e' lateral:   9.17 cm/s LVOT diam:     1.90 cm      LV E/e' lateral: 6.4 LV SV:         54 LV SV Index:   30 LVOT Area:     2.84 cm  LV Volumes (MOD) LV vol d, MOD A2C: 109.0 ml LV vol d, MOD A4C:  142.0 ml LV vol s, MOD A2C: 49.8 ml LV vol s, MOD A4C: 51.2 ml LV SV MOD A2C:     59.2 ml LV SV MOD A4C:     142.0 ml LV SV MOD BP:      74.6 ml RIGHT VENTRICLE             IVC RV Basal diam:  3.50 cm     IVC diam: 0.80 cm RV Mid diam:    2.50 cm RV S prime:     19.70 cm/s TAPSE (M-mode): 2.1 cm LEFT ATRIUM             Index        RIGHT ATRIUM           Index LA diam:        2.20 cm 1.25 cm/m   RA Area:     14.50 cm LA Vol (A2C):   62.2 ml 35.34 ml/m  RA Volume:   32.20 ml  18.30 ml/m LA Vol (A4C):   59.0 ml 33.53 ml/m LA Biplane Vol: 64.3 ml 36.54 ml/m  AORTIC VALVE                    PULMONIC VALVE AV Area (Vmax):    1.73 cm     PV Vmax:       0.83 m/s  AV Area (Vmean):   1.90 cm     PV Peak grad:  2.8 mmHg AV Area (VTI):     2.10 cm AV Vmax:           143.00 cm/s AV Vmean:          82.400 cm/s AV VTI:            0.255 m AV Peak Grad:      8.2 mmHg AV Mean Grad:      3.0 mmHg LVOT Vmax:         87.50 cm/s LVOT Vmean:        55.300 cm/s LVOT VTI:          0.189 m LVOT/AV VTI ratio: 0.74  AORTA Ao Root diam: 3.40 cm Ao Asc diam:  3.20 cm MITRAL VALVE               TRICUSPID VALVE MV Area (PHT): 2.58 cm    TR Peak grad:   21.2 mmHg MV Area VTI:   1.94 cm    TR Vmax:        230.00 cm/s MV Peak grad:  5.7 mmHg MV Mean grad:  2.0 mmHg    SHUNTS MV Vmax:       1.19 m/s    Systemic VTI:  0.19 m MV Vmean:      55.8 cm/s   Systemic Diam: 1.90 cm MV Decel Time: 294 msec MV E velocity: 59.00 cm/s MV A velocity: 98.30 cm/s MV E/A ratio:  0.60 Donato Schultz MD Electronically signed by Donato Schultz MD Signature Date/Time: 04/08/2023/10:53:31 AM    Final    CT Angio Chest PE W and/or Wo Contrast Result Date: 04/07/2023 CLINICAL DATA:  Pulmonary embolism (PE) suspected, high prob. Syncope. EXAM: CT ANGIOGRAPHY CHEST WITH CONTRAST TECHNIQUE: Multidetector CT imaging of the chest was performed using the standard protocol during bolus administration of intravenous contrast. Multiplanar CT image reconstructions and MIPs  were obtained to evaluate the vascular anatomy. RADIATION DOSE REDUCTION: This exam was performed according to the departmental dose-optimization program which includes automated exposure control, adjustment of the mA and/or kV according to patient size and/or use of iterative reconstruction technique. CONTRAST:  75mL OMNIPAQUE IOHEXOL 350 MG/ML SOLN COMPARISON:  CT angiography chest from 07/19/2022. FINDINGS: Cardiovascular: No evidence of embolism to the proximal subsegmental pulmonary artery level. Normal cardiac size. No pericardial effusion. There is dilation of ascending aorta measuring up to 4.2 cm in diameter. However, please note, this examination was not performed with cardiac gating and therefore the measurement is amenable to cardiac pulsation artifact. There are coronary artery calcifications, in keeping with coronary artery disease. There are also mild peripheral atherosclerotic vascular calcifications of thoracic aorta and its major branches. Dense mitral annulus calcifications noted. Mediastinum/Nodes: Visualized thyroid gland appears grossly unremarkable. No solid / cystic mediastinal masses. The esophagus is nondistended precluding optimal assessment. There are few mildly prominent mediastinal and hilar lymph nodes, which do not meet the size criteria for lymphadenopathy and appear grossly similar to the prior study, favoring benign etiology. No axillary lymphadenopathy by size criteria. Lungs/Pleura: The central tracheo-bronchial tree is patent. There are dependent changes in bilateral lungs. No mass or consolidation. No pleural effusion or pneumothorax. No suspicious lung nodules. Upper Abdomen: There is a small sliding hiatal hernia. Visualized upper abdominal viscera within normal limits. Musculoskeletal: Loop recorder device noted in the left lower anterior chest wall. The visualized soft tissues of the chest wall are otherwise grossly unremarkable.  No suspicious osseous lesions. There are  mild to moderate multilevel degenerative changes in the visualized spine. Review of the MIP images confirms the above findings. IMPRESSION: 1. No embolism to the proximal subsegmental pulmonary artery level. 2. No lung mass, consolidation, pleural effusion or pneumothorax. 3. Multiple other nonacute observations, as described above. Aortic Atherosclerosis (ICD10-I70.0). Electronically Signed   By: Jules Schick M.D.   On: 04/07/2023 17:56   CT HEAD WO CONTRAST Result Date: 04/07/2023 CLINICAL DATA:  Syncope/presyncope. Cerebrovascular cause suspected. Patient reports feeling dizzy over the last 8 hours. EXAM: CT HEAD WITHOUT CONTRAST TECHNIQUE: Contiguous axial images were obtained from the base of the skull through the vertex without intravenous contrast. RADIATION DOSE REDUCTION: This exam was performed according to the departmental dose-optimization program which includes automated exposure control, adjustment of the mA and/or kV according to patient size and/or use of iterative reconstruction technique. COMPARISON:  CT head without contrast 09/08/2022. MR head without and with contrast 12/15/2018 FINDINGS: Brain: Mild atrophy and white matter disease is stable. No acute infarct, hemorrhage, or mass lesion is present. The ventricles are proportionate to the degree of atrophy. No significant extraaxial fluid collection is present. Deep brain nuclei are within normal limits. Remote scratched at a remote lacunar infarct is present in the right cerebellum. The brainstem and cerebellum are otherwise within normal limits. Midline structures are within normal limits. Vascular: Minimal vascular calcifications are present within the cavernous internal carotid arteries bilaterally. No hyperdense vessels are present. Skull: Left wall up mastoidectomy and lateral suboccipital craniotomy is noted. The skull is otherwise intact. No significant extracranial soft tissue lesion is present. Sinuses/Orbits: Chronic right  sphenoid sinus opacification and wall thickening is present. The paranasal sinuses and mastoid air cells are otherwise clear. The globes and orbits are within normal limits. IMPRESSION: 1. No acute intracranial abnormality or significant interval change. 2. Stable mild atrophy and white matter disease. This likely reflects the sequela of chronic microvascular ischemia. 3. Remote lacunar infarct of the right cerebellum. 4. Left wall up mastoidectomy and lateral suboccipital craniotomy. 5. Chronic right sphenoid sinus disease. Electronically Signed   By: Marin Roberts M.D.   On: 04/07/2023 13:34    Microbiology: Results for orders placed or performed during the hospital encounter of 10/10/22  Urine Culture (for pregnant, neutropenic or urologic patients or patients with an indwelling urinary catheter)     Status: Abnormal   Collection Time: 10/10/22  5:32 PM   Specimen: Urine, Clean Catch  Result Value Ref Range Status   Specimen Description URINE, CLEAN CATCH  Final   Special Requests   Final    NONE Performed at [facility], [address] [place], [place] [address]    Culture MULTIPLE SPECIES PRESENT, SUGGEST RECOLLECTION (A)  Final   Report Status 10/12/2022 FINAL  Final    Labs: CBC: Recent Labs  Lab 04/07/23 1230 04/08/23 0251  WBC 6.5 9.3  NEUTROABS 4.7  --   HGB 12.7 12.1  HCT 39.0 36.6  MCV 98.2 95.8  PLT 191 215   Basic Metabolic Panel: Recent Labs  Lab 04/07/23 1230 04/08/23 0251  NA 138 139  K 3.6 3.5  CL 103 106  CO2 20* 24  GLUCOSE 161* 111*  BUN 32* 30*  CREATININE 1.19* 1.03*  CALCIUM 8.4* 8.6*  MG 2.1  --    Liver Function Tests: Recent Labs  Lab 04/07/23 1230  AST 23  ALT 11  ALKPHOS 36*  BILITOT 0.8  PROT 5.6*  ALBUMIN 3.3*   CBG: Recent Labs  Lab 04/07/23 1308  GLUCAP 170*    Discharge time spent: greater than 30 minutes.  Signed: Tyrone Nine, MD [facility] 04/08/2023

## 2023-04-08 NOTE — Consult Note (Addendum)
Cardiology Consultation   [name] MRN: [id number]; DOB: September 18, 1940  Admit date: 04/07/2023 Date of Consult: 04/08/2023  PCP:  Merri Brunette, MD   [facility] Providers Cardiologist:  Yates Decamp, MD        Patient Profile:   Cynthia [name] is a 82 y.o. female with a hx of hypertension, hyperlipidemia, frequent UTI leading to severe orthostatic hypotension, paroxysmal atrial fibrillation, atrial tachycardia, obstructive sleep apnea on CPAP, syncope, GERD, Asc Ao Aneurysm, who is being seen 04/08/2023 for the evaluation of syncope, felt vasovagal, at the request of Dr Jarvis Newcomer.  History of Present Illness:   Cynthia [name] has been having problems w/ dizziness and poor po intake. She stood up and passed out, EMS called, Cards asked to see.   Cynthia [name] was with her daughter when she stood up and briefly lost consciousness.  Her daughter was able to lower her onto a bench and she did not fall or get injured.  Recently, she has been having problems with dizziness when she stands for a period of time.  She has been taking the meclizine at least once a day for this dizziness, but on further questioning, it seems to happen after she has been standing a while.  She was cooking the other day, got a hot flash type presyncope and had to sit down.  Yesterday a.m., she went to get out of bed and had some dizziness, but did not lose consciousness or fall.  They went to brunch about 10:00 so she had not had anything to eat or drink yet.  When they were getting ready to leave, she stood up and got very dizzy and weak.  Symptoms started improving as soon as she laid down. According to her grandson, right as this happened, she had a very weak and thready pulse, but LOC and pulse returned to normal in a few minutes.   She drinks 1-2 glasses of liquid at breakfast, 1 with lunch, 1 with dinner.  She does not remember drinking any extra liquids during the day.  Her blood pressure has  improved since being in the hospital, and she feels fine now.  Initial blood pressure was 101/68.  Orthostatic vital signs have not been done.  She has not had palpitations, shortness of breath or chest pain.  She says she is having significant memory problems, but is not sure she has told her doctor about it.  However, she is on Aricept.   Past Medical History:  Diagnosis Date   Acute GI bleeding 10/27/2017   Arthritis    "joints" (11/10/2017)   Dizziness    Dysrhythmia    afib   Encounter for loop recorder check 06/09/2020   GERD (gastroesophageal reflux disease)    GI bleed 11/06/2017   Hearing loss    History of blood transfusion 10/2017   Hyperlipidemia    Hypertension    Hyperthyroidism    Loop recorder Biotronik Loop Recorder 05/23/2020 05/23/2020   Melanoma (HCC)    "cut off my back"   Memory loss    Pneumonia    Sleep apnea    cpap   Syncope and collapse     Past Surgical History:  Procedure Laterality Date   ABDOMINAL HYSTERECTOMY     "partial"   APPENDECTOMY     BIOPSY  11/17/2017   Procedure: BIOPSY;  Surgeon: Vida Rigger, MD;  Location: [facility];  Service: Endoscopy;;   COLONOSCOPY WITH PROPOFOL N/A 11/17/2017   Procedure:  COLONOSCOPY WITH PROPOFOL;  Surgeon: Vida Rigger, MD;  Location: [facility];  Service: Endoscopy;  Laterality: N/A;   CRANIECTOMY FOR EXCISION OF ACOUSTIC NEUROMA     ESOPHAGOGASTRODUODENOSCOPY (EGD) WITH PROPOFOL N/A 10/28/2017   Procedure: ESOPHAGOGASTRODUODENOSCOPY (EGD) WITH PROPOFOL;  Surgeon: Vida Rigger, MD;  Location: [facility];  Service: Endoscopy;  Laterality: N/A;   FOOT SURGERY Bilateral 04/18/2020   KNEE ARTHROPLASTY Right 06/05/2021   Procedure: COMPUTER ASSISTED TOTAL KNEE ARTHROPLASTY;  Surgeon: Samson Frederic, MD;  Location: [facility];  Service: Orthopedics;  Laterality: Right;   KNEE ARTHROSCOPY Left    LEFT HEART CATH AND CORONARY ANGIOGRAPHY N/A 11/24/2016   Procedure: LEFT HEART CATH AND CORONARY ANGIOGRAPHY;   Surgeon: Corky Crafts, MD;  Location: [facility] INVASIVE CV LAB;  Service: Cardiovascular;  Laterality: N/A;   MELANOMA EXCISION     "off my back"   NASAL SINUS SURGERY     TONSILLECTOMY       Home Medications:  Prior to Admission medications   Medication Sig Start Date End Date Taking? Authorizing Provider  acetaminophen (TYLENOL) 500 MG tablet Take 1,000 mg by mouth at bedtime.   Yes [provider]  Alpha-Lipoic Acid (LIPOIC ACID PO) Take 600 mg by mouth daily.   Yes [provider]  amiodarone (PACERONE) 200 MG tablet Take 1 tablet (200 mg total) by mouth daily. 09/10/21  Yes Cantwell, Celeste C, PA-C  apixaban (ELIQUIS) 5 MG TABS tablet Take 1 tablet (5 mg total) by mouth 2 (two) times daily. 02/15/23  Yes Tolia, Sunit, DO  atorvastatin (LIPITOR) 10 MG tablet Take 1 tablet (10 mg total) by mouth daily. 08/13/22  Yes Custovic, Sabina, DO  AZO-CRANBERRY PO Take 1 tablet by mouth daily.   Yes [provider]  Calcium Carbonate-Vitamin D3 600-400 MG-UNIT TABS Take 1 tablet by mouth daily.   Yes [provider]  cephALEXin (KEFLEX) 250 MG capsule Take 250 mg by mouth 4 (four) times daily.   Yes [provider]  denosumab (PROLIA) 60 MG/ML SOSY injection Inject 60 mg into the skin every 6 (six) months.   Yes [provider]  desmopressin (DDAVP) 0.2 MG tablet Take 3 tablets by mouth as needed (Uriniary tract).   Yes [provider]  diltiazem (CARDIZEM CD) 180 MG 24 hr capsule TAKE 1 CAPSULE BY MOUTH EVERY DAY 02/15/23  Yes Tolia, Sunit, DO  donepezil (ARICEPT) 5 MG tablet Take 1 tablet (5 mg total) by mouth at bedtime. 11/23/22  Yes Sater, Pearletha Furl, MD  fluticasone (FLONASE) 50 MCG/ACT nasal spray Place 1 spray into the nose at bedtime as needed for allergies.   Yes [provider]  losartan (COZAAR) 100 MG tablet Take 50 mg by mouth every evening. 04/15/20  Yes [provider]  meclizine (ANTIVERT) 25 MG tablet Take  25 mg by mouth daily as needed for dizziness.   Yes [provider]  Melatonin 5 MG CAPS Take 10 mg by mouth at bedtime.   Yes [provider]  multivitamin-iron-minerals-folic acid (CENTRUM) chewable tablet Chew 1 tablet by mouth daily.   Yes [provider]  nitroGLYCERIN (NITROLINGUAL) 0.4 MG/SPRAY spray Place 1 spray under the tongue every 5 (five) minutes x 3 doses as needed for chest pain. 03/19/21  Yes Cantwell, Celeste C, PA-C  Omega-3 1000 MG CAPS Take 2,000 mg by mouth daily.    Yes [provider]  ondansetron (ZOFRAN) 4 MG tablet Take 4 mg by mouth every 8 (eight) hours as needed  for nausea or vomiting.   Yes [provider]  pantoprazole (PROTONIX) 40 MG tablet Take 40 mg by mouth daily before breakfast.  06/12/14  Yes [provider]  Pediatric Multiple Vitamins (FLINTSTONES MULTIVITAMIN) CHEW Chew 1 tablet by mouth daily.   Yes [provider]  sertraline (ZOLOFT) 25 MG tablet Take 25 mg by mouth daily. 03/28/23  Yes [provider]  thyroid (ARMOUR) 60 MG tablet Take 60 mg by mouth daily before breakfast. 11/07/17  Yes [provider]  traMADol (ULTRAM) 50 MG tablet Take 50 mg by mouth every 6 (six) hours as needed. 03/25/23  Yes [provider]  vitamin C (ASCORBIC ACID) 500 MG tablet Take 500 mg by mouth daily.   Yes [provider]    Inpatient Medications: Scheduled Meds:  amiodarone  200 mg Oral Daily   apixaban  5 mg Oral BID   atorvastatin  10 mg Oral Daily   diltiazem  180 mg Oral Daily   donepezil  5 mg Oral QHS   losartan  50 mg Oral QPM   pantoprazole  40 mg Oral QAC breakfast   sertraline  25 mg Oral Daily   Continuous Infusions:  lactated ringers 100 mL/hr at 04/08/23 0922   PRN Meds: acetaminophen **OR** acetaminophen, desmopressin, HYDROcodone-acetaminophen, ondansetron **OR** ondansetron (ZOFRAN) IV  Allergies:    Allergies  Allergen Reactions   Flecainide  Other (See Comments)    Severe dizziness   Oxycodone Other (See Comments)    uphoria   Trimethoprim Other (See Comments)    Other reaction(s): hives   Alendronate Sodium Nausea Only   Avelox [Moxifloxacin Hcl In Nacl] Other (See Comments)    Unknown reaction   Ciprofloxacin Other (See Comments)    Other reaction(s): multiple medication interactions   Other Other (See Comments)    Avelox+cymbalta Together Other reaction(s): flu like symptoms    Social History:   Social History   Socioeconomic History   Marital status: Married    Spouse name: Not on file   Number of children: 2   Years of education: Not on file   Highest education level: Not on file  Occupational History   Not on file  Tobacco Use   Smoking status: Former    Current packs/day: 0.00    Types: Cigarettes    Start date: 70    Quit date: 1980    Years since quitting: 45.0   Smokeless tobacco: Never   Tobacco comments:    11/10/2017 "only smoked when I was on my period"  Vaping Use   Vaping status: Never Used  Substance and Sexual Activity   Alcohol use: Yes    Alcohol/week: 1.0 standard drink of alcohol    Types: 1 Glasses of wine per week    Comment: wine on occassionally   Drug use: Never   Sexual activity: Not Currently  Other Topics Concern   Not on file  Social History Narrative   Right handed    Social Drivers of Health   Financial Resource Strain: Not on file  Food Insecurity: Unknown (04/07/2023)   Hunger Vital Sign    Worried About Running Out of Food in the Last Year: Not on file    Ran Out of Food in the Last Year: Never true  Transportation Needs: No Transportation Needs (04/07/2023)   PRAPARE - Administrator, Civil Service (Medical): No    Lack of Transportation (Non-Medical): No  Physical Activity: Not on file  Stress:  Not on file  Social Connections: Unknown (08/16/2021)   [facility]   Social Network    Social Network: Not on file   Intimate Partner Violence: Not At Risk (04/07/2023)   Humiliation, Afraid, Rape, and Kick questionnaire    Fear of Current or Ex-Partner: No    Emotionally Abused: No    Physically Abused: No    Sexually Abused: No    Family History:   Family History  Problem Relation Age of Onset   Congestive Heart Failure Mother    Heart disease Father        History of heart attacks at a later age.     Heart disease Brother    Hearing loss Brother      ROS:  Please see the history of present illness.  All other ROS reviewed and negative.     Physical Exam/Data:   Vitals:   04/08/23 0018 04/08/23 0309 04/08/23 0624 04/08/23 0752  BP: (!) 149/72 (!) 168/63 (!) 145/61 (!) 154/79  Pulse: 64 64 63   Resp: 16 18 16 18   Temp: 98.2 F (36.8 C) 98.2 F (36.8 C)  98.3 F (36.8 C)  TempSrc: Oral Oral  Oral  SpO2: 98% 96% 99%   Weight:      Height:        Intake/Output Summary (Last 24 hours) at 04/08/2023 1213 Last data filed at 04/08/2023 0300 Gross per 24 hour  Intake 1466.46 ml  Output --  Net 1466.46 ml      04/07/2023   12:10 PM 02/26/2023    8:25 AM 02/15/2023   11:02 AM  Last 3 Weights  Weight (lbs) 151 lb 14.4 oz 151 lb 12.8 oz 151 lb  Weight (kg) 68.9 kg 68.856 kg 68.493 kg     Body mass index is 25.28 kg/m.  General:  Well nourished, well developed, elderly female in no acute distress HEENT: normal Neck: no JVD Vascular: No carotid bruits; Distal pulses 2+ bilaterally Cardiac:  normal S1, S2; RRR; 2/6 murmur  Lungs:  clear to auscultation bilaterally, no wheezing, rhonchi or rales  Abd: soft, nontender, no hepatomegaly  Ext: no edema Musculoskeletal:  No deformities, BUE and BLE strength normal and equal Skin: warm and dry  Neuro:  CNs 2-12 intact, no focal abnormalities noted Psych:  Normal affect   EKG:  The EKG was personally reviewed and demonstrates:  SR, HR 70, diffuse T wave flattening, no change from 10/07/2022 Telemetry:  Telemetry was personally  reviewed and demonstrates: Sinus rhythm  Relevant CV Studies:  ECHO: 04/08/2023  1. Left ventricular ejection fraction, by estimation, is 60 to 65%. The left ventricle has normal function. The left ventricle has no regional wall motion abnormalities. Left ventricular diastolic parameters are indeterminate.   2. Right ventricular systolic function is normal. The right ventricular size is normal. There is normal pulmonary artery systolic pressure.  RVSP is 24.2  3. Left atrial size was mildly dilated.   4. The mitral valve is normal in structure. Mild mitral valve  regurgitation. No evidence of mitral stenosis. The mean mitral valve gradient is 2.0 mmHg. Severe mitral annular calcification.   5. The aortic valve is calcified. There is moderate calcification of the aortic valve. There is moderate thickening of the aortic valve. Aortic valve regurgitation is not visualized. Aortic valve sclerosis is present, with no evidence of aortic valve  stenosis. Aortic valve mean gradient measures 3.0 mmHg. Aortic valve Vmax measures 1.43 m/s.  6. The inferior vena cava is normal in size with greater than 50% respiratory variability, suggesting right atrial pressure of 3 mmHg.   Cardiac monitor (Zio Patch): Aug 20, 2022-Sep 03, 2022 Dominant rhythm sinus. Heart rate 45-152 bpm.  Avg HR 63 bpm. No atrial fibrillation, ventricular tachycardia, high grade AV block, pauses (3 seconds or longer). Total ventricular ectopic burden <1%. Total supraventricular ectopic burden <1%. Rare episodes of PSVT/atrial tach Patient triggered events: 3.  Underlying rhythm was sinus without arrhythmia   Laboratory Data:  High Sensitivity Troponin:   Recent Labs  Lab 04/07/23 1230 04/07/23 1828  TROPONINIHS 5 7     Chemistry Recent Labs  Lab 04/07/23 1230 04/08/23 0251  NA 138 139  K 3.6 3.5  CL 103 106  CO2 20* 24  GLUCOSE 161* 111*  BUN 32* 30*  CREATININE 1.19* 1.03*  CALCIUM 8.4* 8.6*  MG 2.1  --    GFRNONAA 46* 55*  ANIONGAP 15 9    Recent Labs  Lab 04/07/23 1230  PROT 5.6*  ALBUMIN 3.3*  AST 23  ALT 11  ALKPHOS 36*  BILITOT 0.8   Lipids No results for input(s): "CHOL", "TRIG", "HDL", "LABVLDL", "LDLCALC", "CHOLHDL" in the last 168 hours.  Hematology Recent Labs  Lab 04/07/23 1230 04/08/23 0251  WBC 6.5 9.3  RBC 3.97 3.82*  HGB 12.7 12.1  HCT 39.0 36.6  MCV 98.2 95.8  MCH 32.0 31.7  MCHC 32.6 33.1  RDW 12.9 12.9  PLT 191 215   Thyroid  Recent Labs  Lab 04/07/23 1828  TSH 0.646    BNP Recent Labs  Lab 04/07/23 1230  BNP 53.3    DDimer No results for input(s): "DDIMER" in the last 168 hours.   Radiology/Studies:  ECHOCARDIOGRAM COMPLETE Result Date: 04/08/2023    ECHOCARDIOGRAM REPORT   Patient Name:   Niva [name] Bellavance Date of Exam: 04/08/2023 Medical Rec #:  [id number]      Height:       65.0 in Accession #:    [id number]     Weight:       151.9 lb Date of Birth:  [birth year]      BSA:          1.760 m Patient Age:    82 years       BP:           154/79 mmHg Patient Gender: F              HR:           60 bpm. Exam Location:  Inpatient Procedure: 2D Echo, Cardiac Doppler and Color Doppler Indications:    Syncope  History:        Patient has prior history of Echocardiogram examinations, most                 recent 12/22/2020. Arrythmias:Atrial Fibrillation,                 Signs/Symptoms:Syncope; Risk Factors:Sleep Apnea, Hypertension                 and Dyslipidemia.  Sonographer:    Vern Claude Referring Phys: [id number] ROBERT DORRELL IMPRESSIONS  1. Left ventricular ejection fraction, by estimation, is 60 to 65%. The left ventricle has normal function. The left ventricle has no regional wall motion abnormalities. Left ventricular diastolic parameters are indeterminate.  2. Right ventricular systolic function is normal. The right ventricular size is normal. There is normal pulmonary artery systolic pressure.  3. Left atrial size was mildly dilated.  4. The mitral  valve is normal in structure. Mild mitral valve regurgitation. No evidence of mitral stenosis. The mean mitral valve gradient is 2.0 mmHg. Severe mitral annular calcification.  5. The aortic valve is calcified. There is moderate calcification of the aortic valve. There is moderate thickening of the aortic valve. Aortic valve regurgitation is not visualized. Aortic valve sclerosis is present, with no evidence of aortic valve stenosis. Aortic valve mean gradient measures 3.0 mmHg. Aortic valve Vmax measures 1.43 m/s.  6. The inferior vena cava is normal in size with greater than 50% respiratory variability, suggesting right atrial pressure of 3 mmHg. FINDINGS  Left Ventricle: Left ventricular ejection fraction, by estimation, is 60 to 65%. The left ventricle has normal function. The left ventricle has no regional wall motion abnormalities. The left ventricular internal cavity size was normal in size. There is  no left ventricular hypertrophy. Left ventricular diastolic parameters are indeterminate. Right Ventricle: The right ventricular size is normal. No increase in right ventricular wall thickness. Right ventricular systolic function is normal. There is normal pulmonary artery systolic pressure. The tricuspid regurgitant velocity is 2.30 m/s, and  with an assumed right atrial pressure of 3 mmHg, the estimated right ventricular systolic pressure is 24.2 mmHg. Left Atrium: Left atrial size was mildly dilated. Right Atrium: Right atrial size was normal in size. Pericardium: There is no evidence of pericardial effusion. Mitral Valve: The mitral valve is normal in structure. Severe mitral annular calcification. Mild mitral valve regurgitation. No evidence of mitral valve stenosis. MV peak gradient, 5.7 mmHg. The mean mitral valve gradient is 2.0 mmHg. Tricuspid Valve: The tricuspid valve is normal in structure. Tricuspid valve regurgitation is trivial. No evidence of tricuspid stenosis. Aortic Valve: The aortic valve is  calcified. There is moderate calcification of the aortic valve. There is moderate thickening of the aortic valve. Aortic valve regurgitation is not visualized. Aortic valve sclerosis is present, with no evidence of aortic valve stenosis. Aortic valve mean gradient measures 3.0 mmHg. Aortic valve peak gradient measures 8.2 mmHg. Aortic valve area, by VTI measures 2.10 cm. Pulmonic Valve: The pulmonic valve was normal in structure. Pulmonic valve regurgitation is trivial. No evidence of pulmonic stenosis. Aorta: The aortic root is normal in size and structure. Venous: The inferior vena cava is normal in size with greater than 50% respiratory variability, suggesting right atrial pressure of 3 mmHg. IAS/Shunts: No atrial level shunt detected by color flow Doppler.  LEFT VENTRICLE PLAX 2D LVIDd:         5.30 cm      Diastology LVIDs:         3.00 cm      LV e' medial:    6.68 cm/s LV PW:         0.80 cm      LV E/e' medial:  8.8 LV IVS:        0.70 cm      LV e' lateral:   9.17 cm/s LVOT diam:     1.90 cm      LV E/e' lateral: 6.4 LV SV:         54 LV SV Index:   30 LVOT Area:     2.84 cm  LV Volumes (MOD) LV vol d, MOD A2C: 109.0 ml LV vol d, MOD A4C: 142.0 ml LV vol s, MOD A2C: 49.8 ml LV vol s, MOD A4C: 51.2 ml LV SV MOD A2C:  59.2 ml LV SV MOD A4C:     142.0 ml LV SV MOD BP:      74.6 ml RIGHT VENTRICLE             IVC RV Basal diam:  3.50 cm     IVC diam: 0.80 cm RV Mid diam:    2.50 cm RV S prime:     19.70 cm/s TAPSE (M-mode): 2.1 cm LEFT ATRIUM             Index        RIGHT ATRIUM           Index LA diam:        2.20 cm 1.25 cm/m   RA Area:     14.50 cm LA Vol (A2C):   62.2 ml 35.34 ml/m  RA Volume:   32.20 ml  18.30 ml/m LA Vol (A4C):   59.0 ml 33.53 ml/m LA Biplane Vol: 64.3 ml 36.54 ml/m  AORTIC VALVE                    PULMONIC VALVE AV Area (Vmax):    1.73 cm     PV Vmax:       0.83 m/s AV Area (Vmean):   1.90 cm     PV Peak grad:  2.8 mmHg AV Area (VTI):     2.10 cm AV Vmax:           143.00  cm/s AV Vmean:          82.400 cm/s AV VTI:            0.255 m AV Peak Grad:      8.2 mmHg AV Mean Grad:      3.0 mmHg LVOT Vmax:         87.50 cm/s LVOT Vmean:        55.300 cm/s LVOT VTI:          0.189 m LVOT/AV VTI ratio: 0.74  AORTA Ao Root diam: 3.40 cm Ao Asc diam:  3.20 cm MITRAL VALVE               TRICUSPID VALVE MV Area (PHT): 2.58 cm    TR Peak grad:   21.2 mmHg MV Area VTI:   1.94 cm    TR Vmax:        230.00 cm/s MV Peak grad:  5.7 mmHg MV Mean grad:  2.0 mmHg    SHUNTS MV Vmax:       1.19 m/s    Systemic VTI:  0.19 m MV Vmean:      55.8 cm/s   Systemic Diam: 1.90 cm MV Decel Time: 294 msec MV E velocity: 59.00 cm/s MV A velocity: 98.30 cm/s MV E/A ratio:  0.60 Donato Schultz MD Electronically signed by Donato Schultz MD Signature Date/Time: 04/08/2023/10:53:31 AM    Final    CT Angio Chest PE W and/or Wo Contrast Result Date: 04/07/2023 CLINICAL DATA:  Pulmonary embolism (PE) suspected, high prob. Syncope. EXAM: CT ANGIOGRAPHY CHEST WITH CONTRAST TECHNIQUE: Multidetector CT imaging of the chest was performed using the standard protocol during bolus administration of intravenous contrast. Multiplanar CT image reconstructions and MIPs were obtained to evaluate the vascular anatomy. RADIATION DOSE REDUCTION: This exam was performed according to the departmental dose-optimization program which includes automated exposure control, adjustment of the mA and/or kV according to patient size and/or use of iterative reconstruction technique. CONTRAST:  75mL OMNIPAQUE IOHEXOL 350 MG/ML SOLN COMPARISON:  CT angiography chest from 07/19/2022. FINDINGS: Cardiovascular: No evidence of embolism to the proximal subsegmental pulmonary artery level. Normal cardiac size. No pericardial effusion. There is dilation of ascending aorta measuring up to 4.2 cm in diameter. However, please note, this examination was not performed with cardiac gating and therefore the measurement is amenable to cardiac pulsation artifact. There  are coronary artery calcifications, in keeping with coronary artery disease. There are also mild peripheral atherosclerotic vascular calcifications of thoracic aorta and its major branches. Dense mitral annulus calcifications noted. Mediastinum/Nodes: Visualized thyroid gland appears grossly unremarkable. No solid / cystic mediastinal masses. The esophagus is nondistended precluding optimal assessment. There are few mildly prominent mediastinal and hilar lymph nodes, which do not meet the size criteria for lymphadenopathy and appear grossly similar to the prior study, favoring benign etiology. No axillary lymphadenopathy by size criteria. Lungs/Pleura: The central tracheo-bronchial tree is patent. There are dependent changes in bilateral lungs. No mass or consolidation. No pleural effusion or pneumothorax. No suspicious lung nodules. Upper Abdomen: There is a small sliding hiatal hernia. Visualized upper abdominal viscera within normal limits. Musculoskeletal: Loop recorder device noted in the left lower anterior chest wall. The visualized soft tissues of the chest wall are otherwise grossly unremarkable. No suspicious osseous lesions. There are mild to moderate multilevel degenerative changes in the visualized spine. Review of the MIP images confirms the above findings. IMPRESSION: 1. No embolism to the proximal subsegmental pulmonary artery level. 2. No lung mass, consolidation, pleural effusion or pneumothorax. 3. Multiple other nonacute observations, as described above. Aortic Atherosclerosis (ICD10-I70.0). Electronically Signed   By: Jules Schick M.D.   On: 04/07/2023 17:56   CT HEAD WO CONTRAST Result Date: 04/07/2023 CLINICAL DATA:  Syncope/presyncope. Cerebrovascular cause suspected. Patient reports feeling dizzy over the last 8 hours. EXAM: CT HEAD WITHOUT CONTRAST TECHNIQUE: Contiguous axial images were obtained from the base of the skull through the vertex without intravenous contrast. RADIATION  DOSE REDUCTION: This exam was performed according to the departmental dose-optimization program which includes automated exposure control, adjustment of the mA and/or kV according to patient size and/or use of iterative reconstruction technique. COMPARISON:  CT head without contrast 09/08/2022. MR head without and with contrast 12/15/2018 FINDINGS: Brain: Mild atrophy and white matter disease is stable. No acute infarct, hemorrhage, or mass lesion is present. The ventricles are proportionate to the degree of atrophy. No significant extraaxial fluid collection is present. Deep brain nuclei are within normal limits. Remote scratched at a remote lacunar infarct is present in the right cerebellum. The brainstem and cerebellum are otherwise within normal limits. Midline structures are within normal limits. Vascular: Minimal vascular calcifications are present within the cavernous internal carotid arteries bilaterally. No hyperdense vessels are present. Skull: Left wall up mastoidectomy and lateral suboccipital craniotomy is noted. The skull is otherwise intact. No significant extracranial soft tissue lesion is present. Sinuses/Orbits: Chronic right sphenoid sinus opacification and wall thickening is present. The paranasal sinuses and mastoid air cells are otherwise clear. The globes and orbits are within normal limits. IMPRESSION: 1. No acute intracranial abnormality or significant interval change. 2. Stable mild atrophy and white matter disease. This likely reflects the sequela of chronic microvascular ischemia. 3. Remote lacunar infarct of the right cerebellum. 4. Left wall up mastoidectomy and lateral suboccipital craniotomy. 5. Chronic right sphenoid sinus disease. Electronically Signed   By: Marin Roberts M.D.   On: 04/07/2023 13:34     Assessment and Plan:   Syncope -Her daughter caught her and  she suffered no injuries, but it seems that there was a brief period of complete loss of consciousness. -On  arrival to the hospital, she got to 500 cc boluses of LR, and was on a drip at 100 cc an hour -Her BUN and creatinine were above normal on admission and are still above normal, but have improved with hydration. -She has no signs of volume overload, continue hydration  -Orthostatic vital signs were not done on arrival, have ordered them, follow-up on results -Do not believe this is related to arrhythmia, ck monitor  -Do not believe she has adequate p.o. fluid intake, recommend 6-8 8 ounce glasses of liquid daily, mostly water -Follow-up as an outpatient  2.  Hypertension -She is on her home meds of Cardizem CD 180 mg daily, and losartan 50 mg daily -Systolic blood pressure currently a little over 150, but would not make med changes, permissive hypertension may help her tolerate position changes  3.  PAF -No A-fib on loop recorder interrogation 01/21/2023 -Sinus rhythm on telemetry -She has been on amiodarone 200 mg daily since February 2023, but it was prescribed last by Hospitalist and they would not refill it. Has not had it for a month.  - discuss continuing this w/ MD -Ambulate to make sure she does not have chronotropic incompetence -Continue Eliquis 5 mg twice daily   Risk Assessment/Risk Scores:     CHA2DS2-VASc Score = 5   This indicates a 7.2% annual risk of stroke. The patient's score is based upon: CHF History: 0 HTN History: 1 Diabetes History: 0 Stroke History: 0 Vascular Disease History: 1 Age Score: 2 Gender Score: 1   For questions or updates, please contact [facility] Please consult [email or web address] for contact info under    Signed, Theodore Demark, PA-C  04/08/2023 12:13 PM  History and all data above reviewed.  Patient examined.  I agree with the findings as above.  The patient has been having presyncope or dizziness and has had an extensive workup via Dr.  Elias Else husband reports that her dizziness episodes have been getting worse recently.  She  has been taking meclizine for sometimes.  She seems to be somewhat fuzzy on the details but yesterday she had 2 episodes.  1 was in the morning while trying to get ready to go up to breakfast.  She felt lightheaded and had to lie down and take a meclizine.  When she got to the restaurant she had dizziness and got up to leave and actually had to be eased down to a booth where she had probably a brief episode of loss of consciousness.  Her grandson said her pulse was weak radial.  It is palpable brachial.  She was minimally coherent.  She does not recall the events.  Her episodes have been somewhat similar to this but not as severe and they happen when she is seated or standing.  She has moved very quickly so she has not really noticed a change in positions causing her symptoms but again she is limited by balance problems and walks with a walker.  She has not had any chest pressure, neck or arm discomfort.  She really does not feel palpitations.  She has an implanted loop and has had some minimal atrial fibrillation on this.  She was on amiodarone at 1 point in time but has been out of this for a while and it was not restarted apparently.  She does take her anticoagulation.  She has not had any  bradycardic arrhythmias on the last loop interrogation and I looked at this 1 from earlier this year.  I also looked at the EMS run sheet and there were no arrhythmias noted but they did not do orthostatics.  Unfortunately she did not have orthostatics done in the emergency room either.  She is otherwise been feeling well prior to this.  The patient exam reveals COR: Regular rate and rhythm, 2 out of 6 apical systolic murmur nonradiating, no diastolic,  Lungs: Clear to auscultation bilaterally,  Abd: Positive bowel sounds normal frequency pitch, bruits, rebound, guarding, Ext 2+ pulses, no edema..  All available labs, radiology testing, previous records reviewed. Agree with documented assessment and plan.  Syncope: This was  most likely related to orthostatic hypotension.  We will interrogate her loop implant to make sure she has no bradycardic arrhythmias but I doubt this.  She does not hydrate and this will be encouraged.  She does not take her blood pressure at home and we will need to have more frequent readings understand what her supine blood pressure is.  She already moves slowly and will continue to be cautioned against sudden changes in position.  I talked with her and her family about compression garments and they will get an abdominal binder and knee-high compression socks.  At this point I do not think any further invasive workup is suggested.  If she continues to have these events we might need to treat her with either fludrocortisone or midodrine.  However, I do not think she needs to have this started at this time.    Fayrene Fearing Feige Lowdermilk  2:10 PM  04/08/2023

## 2023-04-15 ENCOUNTER — Telehealth: Payer: Self-pay | Admitting: Physical Therapy

## 2023-04-15 ENCOUNTER — Telehealth: Payer: Self-pay

## 2023-04-15 DIAGNOSIS — I10 Essential (primary) hypertension: Secondary | ICD-10-CM | POA: Diagnosis not present

## 2023-04-15 DIAGNOSIS — Z09 Encounter for follow-up examination after completed treatment for conditions other than malignant neoplasm: Secondary | ICD-10-CM | POA: Diagnosis not present

## 2023-04-15 DIAGNOSIS — Z789 Other specified health status: Secondary | ICD-10-CM | POA: Diagnosis not present

## 2023-04-15 DIAGNOSIS — M19011 Primary osteoarthritis, right shoulder: Secondary | ICD-10-CM | POA: Diagnosis not present

## 2023-04-15 DIAGNOSIS — M19012 Primary osteoarthritis, left shoulder: Secondary | ICD-10-CM | POA: Diagnosis not present

## 2023-04-15 DIAGNOSIS — R55 Syncope and collapse: Secondary | ICD-10-CM | POA: Diagnosis not present

## 2023-04-15 DIAGNOSIS — R2681 Unsteadiness on feet: Secondary | ICD-10-CM | POA: Diagnosis not present

## 2023-04-15 NOTE — Telephone Encounter (Signed)
 Verbal order given

## 2023-04-15 NOTE — Telephone Encounter (Signed)
 Patient husband called and advised she was in the hospital on christmas day for dehydration and in order to continue her treatment at neuro rehab your approval is needed.   [facility] -Eileen Stanford

## 2023-04-15 NOTE — Telephone Encounter (Signed)
 Called and spoke to pt's husband, Cynthia [name], regarding need for resume PT orders as pt was recently admitted to [facility] for syncope episode. Informed [name] to contact Dr. Lennette office to obtain orders. Husband verbalized understanding.   Cynthia [name], PT, DPT

## 2023-04-16 ENCOUNTER — Ambulatory Visit: Payer: Medicare PPO | Attending: Physical Medicine and Rehabilitation | Admitting: Physical Therapy

## 2023-04-22 ENCOUNTER — Encounter: Payer: Medicare PPO | Attending: Physical Medicine and Rehabilitation | Admitting: Physical Medicine and Rehabilitation

## 2023-04-22 VITALS — BP 115/70 | HR 71 | Ht 65.0 in | Wt 152.0 lb

## 2023-04-22 DIAGNOSIS — Z79891 Long term (current) use of opiate analgesic: Secondary | ICD-10-CM

## 2023-04-22 DIAGNOSIS — Z7409 Other reduced mobility: Secondary | ICD-10-CM | POA: Diagnosis present

## 2023-04-22 DIAGNOSIS — Z789 Other specified health status: Secondary | ICD-10-CM

## 2023-04-22 DIAGNOSIS — G894 Chronic pain syndrome: Secondary | ICD-10-CM

## 2023-04-22 DIAGNOSIS — Z5181 Encounter for therapeutic drug level monitoring: Secondary | ICD-10-CM | POA: Diagnosis present

## 2023-04-22 DIAGNOSIS — M48062 Spinal stenosis, lumbar region with neurogenic claudication: Secondary | ICD-10-CM

## 2023-04-22 MED ORDER — LIDOCAINE 5 % EX PTCH: 1.0000 | MEDICATED_PATCH | CUTANEOUS | 0 refills | Status: AC

## 2023-04-22 MED ORDER — TRAMADOL HCL 50 MG PO TABS: 50.0000 mg | ORAL_TABLET | Freq: Four times a day (QID) | ORAL | 5 refills | Status: DC | PRN

## 2023-04-22 NOTE — Progress Notes (Signed)
 Subjective:    [name], female    DOB: Oct 11, 1940, 83 y.o.   MRN: [id number]  HPI Cynthia [name] is an 83 year old woman who presents to establish care for right little toe, right hip, and left knee pain.  1) Right hip pain: -has been present for a long time -when her pain is severe she uses 2 tylenol  and tramadol  -it discourages her from getting up and walking around -she did PT a long time ago -until fairly recently she was in a weekly exercise group -husband distressed that Qutenza was not approved.  -has not tried lidocaine  patch  2) Left knee pain: -started bothering her about 3 months ago -she feels that has contributed to declining mobility -uses walker when going to appointments  3) Impaired mobility and ADLs -was going to ortho PT and got there late and they wouldn't see her  Pain Inventory Average Pain 7 Pain Right Now 5 My pain is dull and aching  In the last 24 hours, has pain interfered with the following? General activity 8 Relation with others 0 Enjoyment of life 7 What TIME of day is your pain at its worst? varies Sleep (in general) Fair  Pain is worse with: walking, bending, sitting, standing, and some activites Pain improves with: rest, heat/ice, and medication Relief from Meds: 5  use a cane use a walker ability to climb steps?  yes do you drive?  no use a wheelchair  retired I need assistance with the following:  meal prep, household duties, and shopping  trouble walking dizziness  Any changes since last visit?  no  Any changes since last visit?  no    Family History  Problem Relation Age of Onset   Congestive Heart Failure Mother    Heart disease Father        History of heart attacks at a later age.     Heart disease Brother    Hearing loss Brother    Social History   Socioeconomic History   Marital status: Married    Spouse name: Not on file   Number of children: 2   Years of education: Not on file    Highest education level: Not on file  Occupational History   Not on file  Tobacco Use   Smoking status: Former    Current packs/day: 0.00    Types: Cigarettes    Start date: 98    Quit date: 1980    Years since quitting: 45.0   Smokeless tobacco: Never   Tobacco comments:    11/10/2017 only smoked when I was on my period  Vaping Use   Vaping status: Never Used  Substance and Sexual Activity   Alcohol  use: Yes    Alcohol /week: 1.0 standard drink of alcohol     Types: 1 Glasses of wine per week    Comment: wine on occassionally   Drug use: Never   Sexual activity: Not Currently  Other Topics Concern   Not on file  Social History Narrative   Right handed    Social Drivers of Health   Financial Resource Strain: Not on file  Food Insecurity: Unknown (04/07/2023)   Hunger Vital Sign    Worried About Running Out of Food in the Last Year: Not on file    Ran Out of Food in the Last Year: Never true  Transportation Needs: No Transportation Needs (04/07/2023)   PRAPARE - Administrator, Civil Service (Medical): No  Lack of Transportation (Non-Medical): No  Physical Activity: Not on file  Stress: Not on file  Social Connections: Unknown (08/16/2021)   [facility]   Social Network    Social Network: Not on file   Past Surgical History:  Procedure Laterality Date   ABDOMINAL HYSTERECTOMY     partial   APPENDECTOMY     BIOPSY  11/17/2017   Procedure: BIOPSY;  Surgeon: Rosalie Kitchens, MD;  Location: [facility];  Service: Endoscopy;;   COLONOSCOPY WITH PROPOFOL  N/A 11/17/2017   Procedure: COLONOSCOPY WITH PROPOFOL ;  Surgeon: Rosalie Kitchens, MD;  Location: [facility];  Service: Endoscopy;  Laterality: N/A;   CRANIECTOMY FOR EXCISION OF ACOUSTIC NEUROMA     ESOPHAGOGASTRODUODENOSCOPY (EGD) WITH PROPOFOL  N/A 10/28/2017   Procedure: ESOPHAGOGASTRODUODENOSCOPY (EGD) WITH PROPOFOL ;  Surgeon: Rosalie Kitchens, MD;  Location: [facility];  Service:  Endoscopy;  Laterality: N/A;   FOOT SURGERY Bilateral 04/18/2020   KNEE ARTHROPLASTY Right 06/05/2021   Procedure: COMPUTER ASSISTED TOTAL KNEE ARTHROPLASTY;  Surgeon: Fidel Rogue, MD;  Location: [facility];  Service: Orthopedics;  Laterality: Right;   KNEE ARTHROSCOPY Left    LEFT HEART CATH AND CORONARY ANGIOGRAPHY N/A 11/24/2016   Procedure: LEFT HEART CATH AND CORONARY ANGIOGRAPHY;  Surgeon: Dann Candyce RAMAN, MD;  Location: [facility] INVASIVE CV LAB;  Service: Cardiovascular;  Laterality: N/A;   MELANOMA EXCISION     off my back   NASAL SINUS SURGERY     TONSILLECTOMY     Past Medical History:  Diagnosis Date   Arthritis    joints (11/10/2017)   Dysrhythmia    afib   GERD (gastroesophageal reflux disease)    GI bleed 11/06/2017   Hearing loss    History of blood transfusion 10/2017   Hyperlipidemia    Hypertension    Hyperthyroidism    Loop recorder Biotronik Loop Recorder 05/23/2020 05/23/2020   Melanoma (HCC)    cut off my back   Memory loss    Pneumonia    Sleep apnea    cpap   BP 115/70   Pulse 71   Ht 5' 5 (1.651 m)   Wt 152 lb (68.9 kg)   SpO2 93%   BMI 25.29 kg/m   Opioid Risk Score:   Fall Risk Score:  `1  Depression screen [facility] 2/9     01/03/2016   11:20 AM 01/03/2016   11:19 AM 08/14/2015   11:45 AM 08/14/2015   11:44 AM 07/03/2015   10:35 AM 05/27/2015    1:38 PM 05/13/2015   11:37 AM  Depression screen PHQ 2/9  Decreased Interest 0 0 0 0 0 0 0  Down, Depressed, Hopeless 0 0 0 0 0 0 0  PHQ - 2 Score 0 0 0 0 0 0 0      Review of Systems  Musculoskeletal:  Positive for back pain and gait problem.  All other systems reviewed and are negative.      Objective:   Physical Exam Gen: no distress, normal appearing HEENT: oral mucosa pink and moist, NCAT Cardio: Reg rate Chest: normal effort, normal rate of breathing Abd: soft, non-distended Ext: no edema Psych: pleasant, normal affect Skin: intact Neuro: Alert and oriented x3, TTP right  buttock and over trochanteric bursitis, TTP in left posterior fossa of knee, exam stable 1/9       Assessment & Plan:   1) Right hip pain:  -discussed that it seems to be secondary to bursitis -referred to physical therapy for  strengthening of glut med and min -continue tylenol  and tramadol  prn -lidocaine  patch prescribed -pain contact and urine sample ordered -Discussed current symptoms of pain and history of pain.  -Discussed benefits of exercise in reducing pain. -Discussed current symptoms of pain and history of pain.  -Discussed benefits of exercise in reducing pain. -Discussed following foods that may reduce pain: 1) Ginger (especially studied for arthritis)- reduce leukotriene production to decrease inflammation 2) Blueberries- high in phytonutrients that decrease inflammation 3) Salmon- marine omega-3s reduce joint swelling and pain 4) Pumpkin seeds- reduce inflammation 5) dark chocolate- reduces inflammation 6) turmeric- reduces inflammation 7) tart cherries - reduce pain and stiffness 8) extra virgin olive oil - its compound olecanthal helps to block prostaglandins  9) chili peppers- can be eaten or applied topically via capsaicin 10) mint- helpful for headache, muscle aches, joint pain, and itching 11) garlic- reduces inflammation  Link to further information on diet for chronic pain: http://www.bray.com/   2) Right buttock pain/sciatica/neuropathy: -Discussed Qutenza as an option for neuropathic pain control. Discussed that this is a capsaicin patch, stronger than capsaicin cream. Discussed that it is currently approved for diabetic peripheral neuropathy and post-herpetic neuralgia, but that it has also shown benefit in treating other forms of neuropathy. Provided patient with link to site to learn more about the patch: https://www.clark.biz/. Discussed that the patch would be placed in office and  benefits usually last 3 months. Discussed that unintended exposure to capsaicin can cause severe irritation of eyes, mucous membranes, respiratory tract, and skin, but that Qutenza is a local treatment and does not have the systemic side effects of other nerve medications. Discussed that there may be pain, itching, erythema, and decreased sensory function associated with the application of Qutenza. Side effects usually subside within 1 week. A cold pack of analgesic medications can help with these side effects. Blood pressure can also be increased due to pain associated with administration of the patch.   Prescribing Home Zynex NexWave Stimulator Device and supplies as needed. IFC, NMES and TENS medically necessary Treatment Rx: Daily @ 30-40 minutes per treatment PRN. Zynex NexWave only, no substitutions. Treatment Goals: 1) To reduce and/or eliminate pain 2) To improve functional capacity and Activities of daily living 3) To reduce or prevent the need for oral medications 4) To improve circulation in the injured region 5) To decrease or prevent muscle spasm and muscle atrophy 6) To provide a self-management tool to the patient The patient has not sufficiently improved with conservative care. Numerous studies indexed by Medline and PubMed.gov have shown Neuromuscular, Interferential, and TENS stimulators to reduce pain, improve function, and reduce medication use in injured patients. Continued use of this evidence based, safe, drug free treatment is both reasonable and medically necessary at this time.   3) Left knee pain -discussed present in posterior fossa and can be due to gait impairment stemming from her right hip/buttock pain -recommended daily turmeric with black pepper  4) Impaired mobility and ADLs: -home health PT/OT ordered -home heath aide ordered

## 2023-04-22 NOTE — Patient Instructions (Addendum)
 Foods that may reduce pain: 1) Ginger (especially studied for arthritis)- reduce leukotriene production to decrease inflammation 2) Blueberries- high in phytonutrients that decrease inflammation 3) Salmon- marine omega-3s reduce joint swelling and pain 4) Pumpkin seeds- reduce inflammation 5) dark chocolate- reduces inflammation 6) turmeric- reduces inflammation 7) tart cherries - reduce pain and stiffness 8) extra virgin olive oil - its compound olecanthal helps to block prostaglandins  9) chili peppers- can be eaten or applied topically via capsaicin 10) mint- helpful for headache, muscle aches, joint pain, and itching 11) garlic- reduces inflammation  Link to further information on diet for chronic pain: http://www.bray.com/   Turmeric to reduce inflammation--can be used in cooking or taken as a supplement.  Benefits of turmeric:  -Highly anti-inflammatory  -Increases antioxidants  -Improves memory, attention, brain disease  -Lowers risk of heart disease  -May help prevent cancer  -Decreases pain  -Alleviates depression  -Delays aging and decreases risk of chronic disease  -Consume with black pepper to increase absorption

## 2023-04-23 ENCOUNTER — Ambulatory Visit: Payer: Medicare PPO | Admitting: Physical Therapy

## 2023-04-23 ENCOUNTER — Telehealth: Payer: Self-pay | Admitting: Physical Therapy

## 2023-04-23 NOTE — Telephone Encounter (Signed)
 Called pt and left VM regarding new home health referral. Informed pt that if she is going to receive HH therapies, we will need to DC from OPPT and I can cancel her appointment today. Provided call back number for clinic.   [name], PT, DPT

## 2023-04-24 DIAGNOSIS — M545 Low back pain, unspecified: Secondary | ICD-10-CM | POA: Diagnosis not present

## 2023-04-24 DIAGNOSIS — M25551 Pain in right hip: Secondary | ICD-10-CM | POA: Diagnosis not present

## 2023-04-24 DIAGNOSIS — I4891 Unspecified atrial fibrillation: Secondary | ICD-10-CM | POA: Diagnosis not present

## 2023-04-24 DIAGNOSIS — R2681 Unsteadiness on feet: Secondary | ICD-10-CM | POA: Diagnosis not present

## 2023-04-24 DIAGNOSIS — I10 Essential (primary) hypertension: Secondary | ICD-10-CM | POA: Diagnosis not present

## 2023-04-24 DIAGNOSIS — M150 Primary generalized (osteo)arthritis: Secondary | ICD-10-CM | POA: Diagnosis not present

## 2023-04-24 DIAGNOSIS — I951 Orthostatic hypotension: Secondary | ICD-10-CM | POA: Diagnosis not present

## 2023-04-24 DIAGNOSIS — G4733 Obstructive sleep apnea (adult) (pediatric): Secondary | ICD-10-CM | POA: Diagnosis not present

## 2023-04-24 DIAGNOSIS — R32 Unspecified urinary incontinence: Secondary | ICD-10-CM | POA: Diagnosis not present

## 2023-04-26 ENCOUNTER — Ambulatory Visit (INDEPENDENT_AMBULATORY_CARE_PROVIDER_SITE_OTHER): Payer: Medicare PPO

## 2023-04-26 ENCOUNTER — Encounter: Payer: Self-pay | Admitting: Physical Therapy

## 2023-04-26 ENCOUNTER — Other Ambulatory Visit: Payer: Self-pay | Admitting: Physical Medicine and Rehabilitation

## 2023-04-26 DIAGNOSIS — R55 Syncope and collapse: Secondary | ICD-10-CM | POA: Diagnosis not present

## 2023-04-26 LAB — CUP PACEART REMOTE DEVICE CHECK
Date Time Interrogation Session: 20250113091807
Implantable Pulse Generator Implant Date: 20220210
Pulse Gen Model: 436066
Pulse Gen Serial Number: 94061742

## 2023-04-26 LAB — TOXASSURE SELECT,+ANTIDEPR,UR

## 2023-04-26 MED ORDER — TRAMADOL HCL 50 MG PO TABS: 50.0000 mg | ORAL_TABLET | Freq: Four times a day (QID) | ORAL | 5 refills | Status: DC | PRN

## 2023-04-26 NOTE — Therapy (Signed)
 [facility] [address] [place], [place], [address] Phone: [phone]   Fax:  [phone]  Patient Details  Name: Cynthia [name] MRN: [id number] Date of Birth: 24-Jan-1941 Referring Provider:  No ref. provider found  Encounter Date: 04/26/2023  PHYSICAL THERAPY DISCHARGE SUMMARY  Visits from Start of Care: 3  Current functional level related to goals / functional outcomes: Pt requires RW at all times w/SBA for safety w/gait    Remaining deficits: High fall risk, cognitive impairments, bilateral knee pain, postural deficits, decreased functional strength    Education / Equipment: HEP   Patient agrees to discharge. Patient goals were not met. Patient is being discharged due to  pt transitioning to [facility].   Yuleimy Kretz E Montana Fassnacht, PT, DPT 04/26/2023, 9:04 AM  [facility] [address] [place], [place], [address] Phone: [phone]   Fax:  [phone]

## 2023-04-28 DIAGNOSIS — R32 Unspecified urinary incontinence: Secondary | ICD-10-CM | POA: Diagnosis not present

## 2023-04-28 DIAGNOSIS — I10 Essential (primary) hypertension: Secondary | ICD-10-CM | POA: Diagnosis not present

## 2023-04-28 DIAGNOSIS — R2681 Unsteadiness on feet: Secondary | ICD-10-CM | POA: Diagnosis not present

## 2023-04-28 DIAGNOSIS — I4891 Unspecified atrial fibrillation: Secondary | ICD-10-CM | POA: Diagnosis not present

## 2023-04-28 DIAGNOSIS — M25551 Pain in right hip: Secondary | ICD-10-CM | POA: Diagnosis not present

## 2023-04-28 DIAGNOSIS — G4733 Obstructive sleep apnea (adult) (pediatric): Secondary | ICD-10-CM | POA: Diagnosis not present

## 2023-04-28 DIAGNOSIS — I951 Orthostatic hypotension: Secondary | ICD-10-CM | POA: Diagnosis not present

## 2023-04-28 DIAGNOSIS — M545 Low back pain, unspecified: Secondary | ICD-10-CM | POA: Diagnosis not present

## 2023-04-28 DIAGNOSIS — M150 Primary generalized (osteo)arthritis: Secondary | ICD-10-CM | POA: Diagnosis not present

## 2023-04-30 ENCOUNTER — Ambulatory Visit: Payer: Medicare PPO | Admitting: Physical Therapy

## 2023-05-03 DIAGNOSIS — M545 Low back pain, unspecified: Secondary | ICD-10-CM | POA: Diagnosis not present

## 2023-05-03 DIAGNOSIS — R2681 Unsteadiness on feet: Secondary | ICD-10-CM | POA: Diagnosis not present

## 2023-05-03 DIAGNOSIS — M25551 Pain in right hip: Secondary | ICD-10-CM | POA: Diagnosis not present

## 2023-05-03 DIAGNOSIS — I10 Essential (primary) hypertension: Secondary | ICD-10-CM | POA: Diagnosis not present

## 2023-05-03 DIAGNOSIS — R32 Unspecified urinary incontinence: Secondary | ICD-10-CM | POA: Diagnosis not present

## 2023-05-03 DIAGNOSIS — M150 Primary generalized (osteo)arthritis: Secondary | ICD-10-CM | POA: Diagnosis not present

## 2023-05-03 DIAGNOSIS — I951 Orthostatic hypotension: Secondary | ICD-10-CM | POA: Diagnosis not present

## 2023-05-03 DIAGNOSIS — I4891 Unspecified atrial fibrillation: Secondary | ICD-10-CM | POA: Diagnosis not present

## 2023-05-03 DIAGNOSIS — G4733 Obstructive sleep apnea (adult) (pediatric): Secondary | ICD-10-CM | POA: Diagnosis not present

## 2023-05-05 ENCOUNTER — Telehealth: Payer: Self-pay | Admitting: Neurology

## 2023-05-05 NOTE — Telephone Encounter (Signed)
Called husband. [facility]ayed [facility] should [facility] refills on [facility]. He will contact pharmacy.

## 2023-05-05 NOTE — Telephone Encounter (Signed)
Pt is requesting a refill for donepezil (ARICEPT) 5 MG tablet .  [facility] [phone]

## 2023-05-06 DIAGNOSIS — N3020 Other chronic cystitis without hematuria: Secondary | ICD-10-CM | POA: Diagnosis not present

## 2023-05-06 DIAGNOSIS — R351 Nocturia: Secondary | ICD-10-CM | POA: Diagnosis not present

## 2023-05-07 ENCOUNTER — Ambulatory Visit: Payer: Medicare PPO | Admitting: Physical Therapy

## 2023-05-11 DIAGNOSIS — R32 Unspecified urinary incontinence: Secondary | ICD-10-CM | POA: Diagnosis not present

## 2023-05-11 DIAGNOSIS — I4891 Unspecified atrial fibrillation: Secondary | ICD-10-CM | POA: Diagnosis not present

## 2023-05-11 DIAGNOSIS — M545 Low back pain, unspecified: Secondary | ICD-10-CM | POA: Diagnosis not present

## 2023-05-11 DIAGNOSIS — M150 Primary generalized (osteo)arthritis: Secondary | ICD-10-CM | POA: Diagnosis not present

## 2023-05-11 DIAGNOSIS — I10 Essential (primary) hypertension: Secondary | ICD-10-CM | POA: Diagnosis not present

## 2023-05-11 DIAGNOSIS — R2681 Unsteadiness on feet: Secondary | ICD-10-CM | POA: Diagnosis not present

## 2023-05-11 DIAGNOSIS — G4733 Obstructive sleep apnea (adult) (pediatric): Secondary | ICD-10-CM | POA: Diagnosis not present

## 2023-05-11 DIAGNOSIS — M25551 Pain in right hip: Secondary | ICD-10-CM | POA: Diagnosis not present

## 2023-05-11 DIAGNOSIS — I951 Orthostatic hypotension: Secondary | ICD-10-CM | POA: Diagnosis not present

## 2023-05-15 DIAGNOSIS — G4733 Obstructive sleep apnea (adult) (pediatric): Secondary | ICD-10-CM | POA: Diagnosis not present

## 2023-05-15 DIAGNOSIS — M25551 Pain in right hip: Secondary | ICD-10-CM | POA: Diagnosis not present

## 2023-05-15 DIAGNOSIS — M150 Primary generalized (osteo)arthritis: Secondary | ICD-10-CM | POA: Diagnosis not present

## 2023-05-15 DIAGNOSIS — R2681 Unsteadiness on feet: Secondary | ICD-10-CM | POA: Diagnosis not present

## 2023-05-15 DIAGNOSIS — M545 Low back pain, unspecified: Secondary | ICD-10-CM | POA: Diagnosis not present

## 2023-05-15 DIAGNOSIS — I951 Orthostatic hypotension: Secondary | ICD-10-CM | POA: Diagnosis not present

## 2023-05-15 DIAGNOSIS — I4891 Unspecified atrial fibrillation: Secondary | ICD-10-CM | POA: Diagnosis not present

## 2023-05-15 DIAGNOSIS — R32 Unspecified urinary incontinence: Secondary | ICD-10-CM | POA: Diagnosis not present

## 2023-05-15 DIAGNOSIS — I10 Essential (primary) hypertension: Secondary | ICD-10-CM | POA: Diagnosis not present

## 2023-05-19 DIAGNOSIS — I951 Orthostatic hypotension: Secondary | ICD-10-CM | POA: Diagnosis not present

## 2023-05-19 DIAGNOSIS — G4733 Obstructive sleep apnea (adult) (pediatric): Secondary | ICD-10-CM | POA: Diagnosis not present

## 2023-05-19 DIAGNOSIS — I10 Essential (primary) hypertension: Secondary | ICD-10-CM | POA: Diagnosis not present

## 2023-05-19 DIAGNOSIS — R2681 Unsteadiness on feet: Secondary | ICD-10-CM | POA: Diagnosis not present

## 2023-05-19 DIAGNOSIS — M545 Low back pain, unspecified: Secondary | ICD-10-CM | POA: Diagnosis not present

## 2023-05-19 DIAGNOSIS — R32 Unspecified urinary incontinence: Secondary | ICD-10-CM | POA: Diagnosis not present

## 2023-05-19 DIAGNOSIS — M25551 Pain in right hip: Secondary | ICD-10-CM | POA: Diagnosis not present

## 2023-05-19 DIAGNOSIS — M150 Primary generalized (osteo)arthritis: Secondary | ICD-10-CM | POA: Diagnosis not present

## 2023-05-19 DIAGNOSIS — I4891 Unspecified atrial fibrillation: Secondary | ICD-10-CM | POA: Diagnosis not present

## 2023-05-20 NOTE — Progress Notes (Signed)
 Cardiology Office Note    Patient Name: Cynthia [name] Date of Encounter: 05/21/2023  Primary Care Provider:  Clarice Nottingham, MD Primary Cardiologist:  Gordy Bergamo, MD Primary Electrophysiologist: None   Past Medical History    Past Medical History:  Diagnosis Date   Arthritis    joints (11/10/2017)   Dysrhythmia    afib   GERD (gastroesophageal reflux disease)    GI bleed 11/06/2017   Hearing loss    History of blood transfusion 10/2017   Hyperlipidemia    Hypertension    Hyperthyroidism    Loop recorder Biotronik Loop Recorder 05/23/2020 05/23/2020   Melanoma (HCC)    cut off my back   Memory loss    Pneumonia    Sleep apnea    cpap    History of Present Illness  Cynthia [name] is a 83 y.o. female with a PMH of nonobstructive CAD with PEA arrest later found to be related to vasovagal syncope with bradycardia in 2019, paroxysmal AF (on Eliquis ), orthostatic hypotension, OSA (on CPAP), HTN, recurrent UTIs who presents for 51-month follow-up.  Cynthia [name] was last seen on 02/26/2023 with complaint of dizziness.  She endorsed frequent urination and potentially dehydration as a cause.  We reviewed her Paceart report for her loop recorder that showed no evidence of AF.  She is also advised to follow-up with Dr. Andree for management of vertigo.  She was seen in the ED on 04/07/2023 for syncope.  She reported episodes of dizziness that occurred while standing.  She did not suffer any injury because her daughter was nearby and caught her prior to falling.  CT of the head was completed showing no evidence of acute abnormality.  She received a bolus of LR and no med changes were made at that time.  She was evaluated by EP staff and loop recorder showed no evidence of AF or significant arrhythmias.  She did not experience any chronotropic incompetence with ambulation.  She was observed overnight and discharged the next day.  She was advised to increase p.o. intake for consistent  hydration.  Cynthia [name] presents today with her husband for posthospital follow-up.  She reports improvement to her symptoms today and denies any further presyncope or syncope.  She is currently drinking liquid IV once daily and reports feeling much better.  Her blood pressure today was 142/80 and heart rate was 68 bpm.  She has been compliant with her current medications and denies any adverse reactions.  During today's visit we discussed the importance of hydration and monitoring her heart rate.  She had no further cardiac complaints or concerns today.  Patient denies chest pain, palpitations, dyspnea, PND, orthopnea, nausea, vomiting, dizziness, syncope, edema, weight gain, or early satiety.   Review of Systems  Please see the history of present illness.    All other systems reviewed and are otherwise negative except as noted above.  Physical Exam    Wt Readings from Last 3 Encounters:  05/21/23 151 lb (68.5 kg)  04/22/23 152 lb (68.9 kg)  04/07/23 151 lb 14.4 oz (68.9 kg)   VS: Vitals:   05/21/23 1127  BP: (!) 142/80  Pulse: 68  SpO2: 97%  ,Body mass index is 25.13 kg/m. GEN: Well nourished, well developed in no acute distress Neck: No JVD; No carotid bruits Pulmonary: Clear to auscultation without rales, wheezing or rhonchi  Cardiovascular: Normal rate. Regular rhythm. Normal S1. Normal S2.   Murmurs: There is no murmur.  ABDOMEN: Soft, non-tender,  non-distended EXTREMITIES:  No edema; No deformity   EKG/LABS/ Recent Cardiac Studies   ECG personally reviewed by me today -none completed today  Risk Assessment/Calculations:    CHA2DS2-VASc Score = 5   This indicates a 7.2% annual risk of stroke. The patient's score is based upon: CHF History: 0 HTN History: 1 Diabetes History: 0 Stroke History: 0 Vascular Disease History: 1 Age Score: 2 Gender Score: 1         Lab Results  Component Value Date   WBC 9.3 04/08/2023   HGB 12.1 04/08/2023   HCT 36.6 04/08/2023    MCV 95.8 04/08/2023   PLT 215 04/08/2023   Lab Results  Component Value Date   CREATININE 1.03 (H) 04/08/2023   BUN 30 (H) 04/08/2023   NA 139 04/08/2023   K 3.5 04/08/2023   CL 106 04/08/2023   CO2 24 04/08/2023   Lab Results  Component Value Date   CHOL 165 11/24/2016   HDL 61 11/24/2016   LDLCALC 86 11/24/2016   TRIG 88 11/24/2016   CHOLHDL 2.7 11/24/2016    Lab Results  Component Value Date   HGBA1C 5.6 11/24/2016   Assessment & Plan    1.  Syncope and collapse: -Patient reports resolution of symptoms with liquid IV and daily hydration. -Most recent Paceart report shows no evidence of arrhythmia or pauses.  2.Paroxysmal AF: -Patient currently on rhythm control with amiodarone  200 mg and Cardizem  180 mg for rate control.  -Today patient is rate controlled at 68 bpm -Continue rate control with Cardizem  180 mg daily -Patient's most recent hemoglobin was 12.1 and creatinine was 1.03 -Continue Eliquis  5 mg twice daily  3.Essential hypertension: -Patient's blood pressure today was mildly elevated at 142/80 -Patient advised to monitor BP at home and continue current blood pressure regimen with Cardizem  Monogen 80 mg daily, losartan  100 mg daily  4.Vertigo: -History of vertigo, recent dizziness possibly related. No recent nausea prior to orange juice incident.   Disposition: Follow-up with Gordy Bergamo, MD or APP in 6 months    Signed, Wyn Raddle, Jackee Shove, NP 05/21/2023, 12:03 PM [facility]

## 2023-05-21 ENCOUNTER — Encounter: Payer: Self-pay | Admitting: Nurse Practitioner

## 2023-05-21 ENCOUNTER — Ambulatory Visit: Payer: Medicare PPO | Attending: Nurse Practitioner | Admitting: Nurse Practitioner

## 2023-05-21 VITALS — BP 142/80 | HR 68 | Ht 65.0 in | Wt 151.0 lb

## 2023-05-21 DIAGNOSIS — G4733 Obstructive sleep apnea (adult) (pediatric): Secondary | ICD-10-CM

## 2023-05-21 DIAGNOSIS — R55 Syncope and collapse: Secondary | ICD-10-CM

## 2023-05-21 DIAGNOSIS — I480 Paroxysmal atrial fibrillation: Secondary | ICD-10-CM | POA: Diagnosis not present

## 2023-05-21 DIAGNOSIS — I10 Essential (primary) hypertension: Secondary | ICD-10-CM

## 2023-05-21 NOTE — Patient Instructions (Signed)
 Medication Instructions:  Your physician recommends that you continue on your current medications as directed. Please refer to the Current Medication list given to you today.  *If you need a refill on your cardiac medications before your next appointment, please call your pharmacy*  Follow-Up: At [facility], you and your health needs are our priority.  As part of our continuing mission to provide you with exceptional heart care, we have created designated Provider Care Teams.  These Care Teams include your primary Cardiologist (physician) and Advanced Practice Providers (APPs -  Physician Assistants and Nurse Practitioners) who all work together to provide you with the care you need, when you need it.  Your next appointment:   6 month(s)  The format for your next appointment:   In Person  Provider:   Madonna Large, DO  Other Instructions    1st Floor: - Lobby - Registration  - Pharmacy  - Lab - Cafe  2nd Floor: - PV Lab - Diagnostic Testing (echo, CT, nuclear med)  3rd Floor: - Vacant  4th Floor: - TCTS (cardiothoracic surgery) - AFib Clinic - Structural Heart Clinic - Vascular Surgery  - Vascular Ultrasound  5th Floor: - HeartCare Cardiology (general and EP) - Clinical Pharmacy for coumadin, hypertension, lipid, weight-loss medications, and med management appointments    Valet parking services will be available as well.

## 2023-05-22 DIAGNOSIS — I951 Orthostatic hypotension: Secondary | ICD-10-CM | POA: Diagnosis not present

## 2023-05-22 DIAGNOSIS — I4891 Unspecified atrial fibrillation: Secondary | ICD-10-CM | POA: Diagnosis not present

## 2023-05-22 DIAGNOSIS — I10 Essential (primary) hypertension: Secondary | ICD-10-CM | POA: Diagnosis not present

## 2023-05-22 DIAGNOSIS — R32 Unspecified urinary incontinence: Secondary | ICD-10-CM | POA: Diagnosis not present

## 2023-05-22 DIAGNOSIS — M150 Primary generalized (osteo)arthritis: Secondary | ICD-10-CM | POA: Diagnosis not present

## 2023-05-22 DIAGNOSIS — R2681 Unsteadiness on feet: Secondary | ICD-10-CM | POA: Diagnosis not present

## 2023-05-22 DIAGNOSIS — G4733 Obstructive sleep apnea (adult) (pediatric): Secondary | ICD-10-CM | POA: Diagnosis not present

## 2023-05-22 DIAGNOSIS — M25551 Pain in right hip: Secondary | ICD-10-CM | POA: Diagnosis not present

## 2023-05-22 DIAGNOSIS — M545 Low back pain, unspecified: Secondary | ICD-10-CM | POA: Diagnosis not present

## 2023-05-29 DIAGNOSIS — I951 Orthostatic hypotension: Secondary | ICD-10-CM | POA: Diagnosis not present

## 2023-05-29 DIAGNOSIS — I4891 Unspecified atrial fibrillation: Secondary | ICD-10-CM | POA: Diagnosis not present

## 2023-05-29 DIAGNOSIS — I10 Essential (primary) hypertension: Secondary | ICD-10-CM | POA: Diagnosis not present

## 2023-05-29 DIAGNOSIS — G4733 Obstructive sleep apnea (adult) (pediatric): Secondary | ICD-10-CM | POA: Diagnosis not present

## 2023-05-29 DIAGNOSIS — M25551 Pain in right hip: Secondary | ICD-10-CM | POA: Diagnosis not present

## 2023-05-29 DIAGNOSIS — M545 Low back pain, unspecified: Secondary | ICD-10-CM | POA: Diagnosis not present

## 2023-05-29 DIAGNOSIS — R2681 Unsteadiness on feet: Secondary | ICD-10-CM | POA: Diagnosis not present

## 2023-05-29 DIAGNOSIS — R32 Unspecified urinary incontinence: Secondary | ICD-10-CM | POA: Diagnosis not present

## 2023-05-29 DIAGNOSIS — M150 Primary generalized (osteo)arthritis: Secondary | ICD-10-CM | POA: Diagnosis not present

## 2023-06-04 ENCOUNTER — Other Ambulatory Visit: Payer: Self-pay | Admitting: Cardiology

## 2023-06-04 ENCOUNTER — Other Ambulatory Visit: Payer: Self-pay

## 2023-06-04 DIAGNOSIS — I951 Orthostatic hypotension: Secondary | ICD-10-CM | POA: Diagnosis not present

## 2023-06-04 DIAGNOSIS — M150 Primary generalized (osteo)arthritis: Secondary | ICD-10-CM | POA: Diagnosis not present

## 2023-06-04 DIAGNOSIS — R0789 Other chest pain: Secondary | ICD-10-CM

## 2023-06-04 DIAGNOSIS — I4891 Unspecified atrial fibrillation: Secondary | ICD-10-CM | POA: Diagnosis not present

## 2023-06-04 DIAGNOSIS — M25551 Pain in right hip: Secondary | ICD-10-CM | POA: Diagnosis not present

## 2023-06-04 DIAGNOSIS — I480 Paroxysmal atrial fibrillation: Secondary | ICD-10-CM

## 2023-06-04 DIAGNOSIS — I10 Essential (primary) hypertension: Secondary | ICD-10-CM | POA: Diagnosis not present

## 2023-06-04 DIAGNOSIS — R2681 Unsteadiness on feet: Secondary | ICD-10-CM | POA: Diagnosis not present

## 2023-06-04 DIAGNOSIS — E782 Mixed hyperlipidemia: Secondary | ICD-10-CM

## 2023-06-04 DIAGNOSIS — M545 Low back pain, unspecified: Secondary | ICD-10-CM | POA: Diagnosis not present

## 2023-06-04 DIAGNOSIS — G4733 Obstructive sleep apnea (adult) (pediatric): Secondary | ICD-10-CM | POA: Diagnosis not present

## 2023-06-04 DIAGNOSIS — R32 Unspecified urinary incontinence: Secondary | ICD-10-CM | POA: Diagnosis not present

## 2023-06-04 MED ORDER — NITROGLYCERIN 0.4 MG/SPRAY TL SOLN: 1.0000 | 3 refills | Status: AC | PRN

## 2023-06-04 MED ORDER — DILTIAZEM HCL ER COATED BEADS 180 MG PO CP24: 180.0000 mg | ORAL_CAPSULE | Freq: Every day | ORAL | 3 refills | Status: DC

## 2023-06-04 NOTE — Telephone Encounter (Signed)
Prescription refill request for Eliquis received. Indication:afib Last office visit:2/25 Scr:1.03  12/24 Age: 83 Weight:68.5  kg  Prescription refilled

## 2023-06-07 NOTE — Progress Notes (Signed)
 Biotronik Loop [employer]

## 2023-06-08 ENCOUNTER — Ambulatory Visit (INDEPENDENT_AMBULATORY_CARE_PROVIDER_SITE_OTHER): Payer: Medicare PPO

## 2023-06-08 DIAGNOSIS — R55 Syncope and collapse: Secondary | ICD-10-CM

## 2023-06-09 DIAGNOSIS — G4733 Obstructive sleep apnea (adult) (pediatric): Secondary | ICD-10-CM | POA: Diagnosis not present

## 2023-06-09 DIAGNOSIS — R32 Unspecified urinary incontinence: Secondary | ICD-10-CM | POA: Diagnosis not present

## 2023-06-09 DIAGNOSIS — M545 Low back pain, unspecified: Secondary | ICD-10-CM | POA: Diagnosis not present

## 2023-06-09 DIAGNOSIS — M25551 Pain in right hip: Secondary | ICD-10-CM | POA: Diagnosis not present

## 2023-06-09 DIAGNOSIS — I10 Essential (primary) hypertension: Secondary | ICD-10-CM | POA: Diagnosis not present

## 2023-06-09 DIAGNOSIS — I4891 Unspecified atrial fibrillation: Secondary | ICD-10-CM | POA: Diagnosis not present

## 2023-06-09 DIAGNOSIS — M150 Primary generalized (osteo)arthritis: Secondary | ICD-10-CM | POA: Diagnosis not present

## 2023-06-09 DIAGNOSIS — R2681 Unsteadiness on feet: Secondary | ICD-10-CM | POA: Diagnosis not present

## 2023-06-09 DIAGNOSIS — I951 Orthostatic hypotension: Secondary | ICD-10-CM | POA: Diagnosis not present

## 2023-06-09 LAB — CUP PACEART REMOTE DEVICE CHECK
Date Time Interrogation Session: 20250225142128
Implantable Pulse Generator Implant Date: 20220210
Pulse Gen Model: 436066
Pulse Gen Serial Number: 94061742

## 2023-06-16 ENCOUNTER — Ambulatory Visit: Payer: Medicare PPO | Admitting: Cardiology

## 2023-06-17 DIAGNOSIS — I951 Orthostatic hypotension: Secondary | ICD-10-CM | POA: Diagnosis not present

## 2023-06-17 DIAGNOSIS — M25551 Pain in right hip: Secondary | ICD-10-CM | POA: Diagnosis not present

## 2023-06-17 DIAGNOSIS — G4733 Obstructive sleep apnea (adult) (pediatric): Secondary | ICD-10-CM | POA: Diagnosis not present

## 2023-06-17 DIAGNOSIS — M150 Primary generalized (osteo)arthritis: Secondary | ICD-10-CM | POA: Diagnosis not present

## 2023-06-17 DIAGNOSIS — I10 Essential (primary) hypertension: Secondary | ICD-10-CM | POA: Diagnosis not present

## 2023-06-17 DIAGNOSIS — I4891 Unspecified atrial fibrillation: Secondary | ICD-10-CM | POA: Diagnosis not present

## 2023-06-17 DIAGNOSIS — R32 Unspecified urinary incontinence: Secondary | ICD-10-CM | POA: Diagnosis not present

## 2023-06-17 DIAGNOSIS — M545 Low back pain, unspecified: Secondary | ICD-10-CM | POA: Diagnosis not present

## 2023-06-17 DIAGNOSIS — R2681 Unsteadiness on feet: Secondary | ICD-10-CM | POA: Diagnosis not present

## 2023-06-18 ENCOUNTER — Other Ambulatory Visit: Payer: Self-pay | Admitting: Thoracic Surgery (Cardiothoracic Vascular Surgery)

## 2023-06-18 DIAGNOSIS — R8279 Other abnormal findings on microbiological examination of urine: Secondary | ICD-10-CM | POA: Diagnosis not present

## 2023-06-18 DIAGNOSIS — N3020 Other chronic cystitis without hematuria: Secondary | ICD-10-CM | POA: Diagnosis not present

## 2023-06-18 DIAGNOSIS — I7121 Aneurysm of the ascending aorta, without rupture: Secondary | ICD-10-CM

## 2023-06-18 DIAGNOSIS — N3946 Mixed incontinence: Secondary | ICD-10-CM | POA: Diagnosis not present

## 2023-07-12 DIAGNOSIS — I10 Essential (primary) hypertension: Secondary | ICD-10-CM | POA: Diagnosis not present

## 2023-07-12 DIAGNOSIS — Z0000 Encounter for general adult medical examination without abnormal findings: Secondary | ICD-10-CM | POA: Diagnosis not present

## 2023-07-12 DIAGNOSIS — E7800 Pure hypercholesterolemia, unspecified: Secondary | ICD-10-CM | POA: Diagnosis not present

## 2023-07-12 DIAGNOSIS — E039 Hypothyroidism, unspecified: Secondary | ICD-10-CM | POA: Diagnosis not present

## 2023-07-12 LAB — LAB REPORT - SCANNED
A1c: 5.9
EGFR: 74

## 2023-07-13 ENCOUNTER — Ambulatory Visit (INDEPENDENT_AMBULATORY_CARE_PROVIDER_SITE_OTHER): Payer: Medicare PPO

## 2023-07-13 DIAGNOSIS — R55 Syncope and collapse: Secondary | ICD-10-CM

## 2023-07-13 LAB — CUP PACEART REMOTE DEVICE CHECK
Date Time Interrogation Session: 20250401151315
Implantable Pulse Generator Implant Date: 20220210
Pulse Gen Model: 436066
Pulse Gen Serial Number: 94061742

## 2023-07-15 DIAGNOSIS — Z79899 Other long term (current) drug therapy: Secondary | ICD-10-CM | POA: Diagnosis not present

## 2023-07-15 DIAGNOSIS — I480 Paroxysmal atrial fibrillation: Secondary | ICD-10-CM | POA: Diagnosis not present

## 2023-07-15 NOTE — Progress Notes (Signed)
 Carelink Summary Report / Loop Recorder

## 2023-07-15 NOTE — Addendum Note (Signed)
 Addended by: Elease Etienne A on: 07/15/2023 11:07 AM   Modules accepted: Orders

## 2023-07-19 DIAGNOSIS — M810 Age-related osteoporosis without current pathological fracture: Secondary | ICD-10-CM | POA: Diagnosis not present

## 2023-07-19 DIAGNOSIS — F329 Major depressive disorder, single episode, unspecified: Secondary | ICD-10-CM | POA: Diagnosis not present

## 2023-07-19 DIAGNOSIS — M25572 Pain in left ankle and joints of left foot: Secondary | ICD-10-CM | POA: Diagnosis not present

## 2023-07-19 DIAGNOSIS — E039 Hypothyroidism, unspecified: Secondary | ICD-10-CM | POA: Diagnosis not present

## 2023-07-19 DIAGNOSIS — R296 Repeated falls: Secondary | ICD-10-CM | POA: Diagnosis not present

## 2023-07-19 DIAGNOSIS — R2689 Other abnormalities of gait and mobility: Secondary | ICD-10-CM | POA: Diagnosis not present

## 2023-07-19 DIAGNOSIS — Z0000 Encounter for general adult medical examination without abnormal findings: Secondary | ICD-10-CM | POA: Diagnosis not present

## 2023-07-19 DIAGNOSIS — E7800 Pure hypercholesterolemia, unspecified: Secondary | ICD-10-CM | POA: Diagnosis not present

## 2023-07-19 DIAGNOSIS — D508 Other iron deficiency anemias: Secondary | ICD-10-CM | POA: Diagnosis not present

## 2023-07-20 ENCOUNTER — Ambulatory Visit
Admission: RE | Admit: 2023-07-20 | Discharge: 2023-07-20 | Disposition: A | Discharge status: Home / Self Care | Source: Ambulatory Visit | Attending: Thoracic Surgery (Cardiothoracic Vascular Surgery) | Admitting: Thoracic Surgery (Cardiothoracic Vascular Surgery)

## 2023-07-20 DIAGNOSIS — I722 Aneurysm of renal artery: Secondary | ICD-10-CM | POA: Diagnosis not present

## 2023-07-20 DIAGNOSIS — I7121 Aneurysm of the ascending aorta, without rupture: Secondary | ICD-10-CM

## 2023-07-20 MED ORDER — IOPAMIDOL (ISOVUE-370) INJECTION 76%
75.0000 mL | Freq: Once | INTRAVENOUS | Status: AC | PRN
  Administered 2023-07-20: 75 mL via INTRAVENOUS

## 2023-07-27 ENCOUNTER — Ambulatory Visit

## 2023-07-28 ENCOUNTER — Encounter: Payer: Self-pay | Admitting: Thoracic Surgery (Cardiothoracic Vascular Surgery)

## 2023-08-02 DIAGNOSIS — M810 Age-related osteoporosis without current pathological fracture: Secondary | ICD-10-CM | POA: Diagnosis not present

## 2023-08-05 DIAGNOSIS — R351 Nocturia: Secondary | ICD-10-CM | POA: Diagnosis not present

## 2023-08-05 DIAGNOSIS — N3020 Other chronic cystitis without hematuria: Secondary | ICD-10-CM | POA: Diagnosis not present

## 2023-08-05 DIAGNOSIS — N3946 Mixed incontinence: Secondary | ICD-10-CM | POA: Diagnosis not present

## 2023-08-06 ENCOUNTER — Encounter: Payer: Self-pay | Admitting: Physical Medicine and Rehabilitation

## 2023-08-06 ENCOUNTER — Encounter: Payer: Medicare PPO | Attending: Physical Medicine and Rehabilitation | Admitting: Physical Medicine and Rehabilitation

## 2023-08-06 VITALS — BP 126/82 | HR 62 | Ht 65.0 in | Wt 150.0 lb

## 2023-08-06 DIAGNOSIS — M7918 Myalgia, other site: Secondary | ICD-10-CM | POA: Diagnosis not present

## 2023-08-06 DIAGNOSIS — M48062 Spinal stenosis, lumbar region with neurogenic claudication: Secondary | ICD-10-CM | POA: Insufficient documentation

## 2023-08-06 DIAGNOSIS — M25572 Pain in left ankle and joints of left foot: Secondary | ICD-10-CM | POA: Diagnosis not present

## 2023-08-06 MED ORDER — JOURNAVX 50 MG PO TABS: 1.0000 | ORAL_TABLET | Freq: Two times a day (BID) | ORAL | 0 refills | Status: AC | PRN

## 2023-08-06 NOTE — Progress Notes (Addendum)
 Subjective:    [name], female    DOB: [birth year], 83 y.o.   MRN: [id number]  HPI Cynthia [name] is an 83 year old woman who presents to establish care for right little toe, right hip, and left knee pain.  1) Right hip pain: -has been present for a long time -when her pain is severe she uses 2 tylenol  and tramadol  -it discourages her from getting up and walking around -she did PT a long time ago -until fairly recently she was in a weekly exercise group  2) Left knee pain: -started bothering her about 3 months ago -she feels that has contributed to declining mobility -uses walker when going to appointments  3) Left ankle pain: -turned the ankle a a couple of weeks ago -eversion hurts -got an XR and there was no fracture -she is elevating   4) Low back pain:  -forgets lidocaine  is there  Pain Inventory Average Pain 7 Pain Right Now 5 My pain is dull and aching  In the last 24 hours, has pain interfered with the following? General activity 8 Relation with others 0 Enjoyment of life 7 What TIME of day is your pain at its worst? varies Sleep (in general) Fair  Pain is worse with: walking, bending, sitting, standing, and some activites Pain improves with: rest, heat/ice, and medication Relief from Meds: 5  use a cane use a walker ability to climb steps?  yes do you drive?  no use a wheelchair  retired I need assistance with the following:  meal prep, household duties, and shopping  trouble walking dizziness  Any changes since last visit?  no  Any changes since last visit?  no    Family History  Problem Relation Age of Onset   Congestive Heart Failure Mother    Heart disease Father        History of heart attacks at a later age.     Heart disease Brother    Hearing loss Brother    Social History   Socioeconomic History   Marital status: Married    Spouse name: Not on file   Number of children: 2   Years of education: Not on file    Highest education level: Not on file  Occupational History   Not on file  Tobacco Use   Smoking status: Former    Current packs/day: 0.00    Types: Cigarettes    Start date: 27    Quit date: 1980    Years since quitting: 45.3   Smokeless tobacco: Never   Tobacco comments:    11/10/2017 "only smoked when I was on my period"  Vaping Use   Vaping status: Never Used  Substance and Sexual Activity   Alcohol  use: Yes    Alcohol /week: 1.0 standard drink of alcohol     Types: 1 Glasses of wine per week    Comment: wine on occassionally   Drug use: Never   Sexual activity: Not Currently  Other Topics Concern   Not on file  Social History Narrative   Right handed    Social Drivers of Health   Financial Resource Strain: Not on file  Food Insecurity: Unknown (04/07/2023)   Hunger Vital Sign    Worried About Running Out of Food in the Last Year: Not on file    Ran Out of Food in the Last Year: Never true  Transportation Needs: No Transportation Needs (04/07/2023)   PRAPARE - Transportation    Lack of  Transportation (Medical): No    Lack of Transportation (Non-Medical): No  Physical Activity: Not on file  Stress: Not on file  Social Connections: Unknown (08/16/2021)   Received from [facility]   Social Network    Social Network: Not on file   Past Surgical History:  Procedure Laterality Date   ABDOMINAL HYSTERECTOMY     "partial"   APPENDECTOMY     BIOPSY  11/17/2017   Procedure: BIOPSY;  Surgeon: Ozell Blunt, MD;  Location: [facility];  Service: Endoscopy;;   COLONOSCOPY WITH PROPOFOL  N/A 11/17/2017   Procedure: COLONOSCOPY WITH PROPOFOL ;  Surgeon: Ozell Blunt, MD;  Location: [facility];  Service: Endoscopy;  Laterality: N/A;   CRANIECTOMY FOR EXCISION OF ACOUSTIC NEUROMA     ESOPHAGOGASTRODUODENOSCOPY (EGD) WITH PROPOFOL  N/A 10/28/2017   Procedure: ESOPHAGOGASTRODUODENOSCOPY (EGD) WITH PROPOFOL ;  Surgeon: Ozell Blunt, MD;  Location: [facility];  Service:  Endoscopy;  Laterality: N/A;   FOOT SURGERY Bilateral 04/18/2020   KNEE ARTHROPLASTY Right 06/05/2021   Procedure: COMPUTER ASSISTED TOTAL KNEE ARTHROPLASTY;  Surgeon: Adonica Hoose, MD;  Location: [facility];  Service: Orthopedics;  Laterality: Right;   KNEE ARTHROSCOPY Left    LEFT HEART CATH AND CORONARY ANGIOGRAPHY N/A 11/24/2016   Procedure: LEFT HEART CATH AND CORONARY ANGIOGRAPHY;  Surgeon: Lucendia Rusk, MD;  Location: [facility] INVASIVE CV LAB;  Service: Cardiovascular;  Laterality: N/A;   MELANOMA EXCISION     "off my back"   NASAL SINUS SURGERY     TONSILLECTOMY     Past Medical History:  Diagnosis Date   Arthritis    "joints" (11/10/2017)   Dysrhythmia    afib   GERD (gastroesophageal reflux disease)    GI bleed 11/06/2017   Hearing loss    History of blood transfusion 10/2017   Hyperlipidemia    Hypertension    Hyperthyroidism    Loop recorder Biotronik Loop Recorder 05/23/2020 05/23/2020   Melanoma (HCC)    "cut off my back"   Memory loss    Pneumonia    Sleep apnea    cpap   BP 126/82   Pulse 62   Ht 5\' 5"  (1.651 m)   Wt 150 lb (68 kg)   SpO2 95%   BMI 24.96 kg/m   Opioid Risk Score:   Fall Risk Score:  `1  Depression screen [facility] 2/9     08/06/2023   11:06 AM 01/03/2016   11:20 AM 01/03/2016   11:19 AM 08/14/2015   11:45 AM 08/14/2015   11:44 AM 07/03/2015   10:35 AM 05/27/2015    1:38 PM  Depression screen PHQ 2/9  Decreased Interest 0 0 0 0 0 0 0  Down, Depressed, Hopeless 0 0 0 0 0 0 0  PHQ - 2 Score 0 0 0 0 0 0 0      Review of Systems  Musculoskeletal:  Positive for back pain and gait problem.  All other systems reviewed and are negative.      Objective:   Physical Exam Gen: no distress, normal appearing HEENT: oral mucosa pink and moist, NCAT Cardio: Reg rate Chest: normal effort, normal rate of breathing Abd: soft, non-distended Ext: no edema Psych: pleasant, normal affect Skin: intact Neuro: Alert and oriented x3, TTP right buttock  and over trochanteric bursitis, TTP in left posterior fossa of knee MSK: stable       Assessment & Plan:   1) Right hip pain:  -discussed that it seems to be secondary to bursitis -  referred to physical therapy for strengthening of glut med and min -continue tylenol  and tramadol  prn -Discussed current symptoms of pain and history of pain.  -Discussed benefits of exercise in reducing pain. -Discussed following foods that may reduce pain: 1) Ginger (especially studied for arthritis)- reduce leukotriene production to decrease inflammation 2) Blueberries- high in phytonutrients that decrease inflammation 3) Salmon- marine omega-3s reduce joint swelling and pain 4) Pumpkin seeds- reduce inflammation 5) dark chocolate- reduces inflammation 6) turmeric- reduces inflammation 7) tart cherries - reduce pain and stiffness 8) extra virgin olive oil - its compound olecanthal helps to block prostaglandins  9) chili peppers- can be eaten or applied topically via capsaicin 10) mint- helpful for headache, muscle aches, joint pain, and itching 11) garlic- reduces inflammation  Link to further information on diet for chronic pain: http://www.bray.com/   2) Right buttock pain/sciatica/neuropathy: -failed lidocaine  patch, PT, Tylenol , Tramadol  -called [employer] to appeal decision of Qutenza denial, discussed that this patch has been studied for this condition in [place] -discussed with patient and husband that they feel unfair that the patch was denied for Ms. Nicolls while it was approved for Mr. Ruminski for the same condition   -Discussed Qutenza as an option for neuropathic pain control. Discussed that this is a capsaicin patch, stronger than capsaicin cream. Discussed that it is currently approved for diabetic peripheral neuropathy and post-herpetic neuralgia, but that it has also shown benefit in treating other forms of neuropathy.  Provided patient with link to site to learn more about the patch: https://www.clark.biz/. Discussed that the patch would be placed in office and benefits usually last 3 months. Discussed that unintended exposure to capsaicin can cause severe irritation of eyes, mucous membranes, respiratory tract, and skin, but that Qutenza is a local treatment and does not have the systemic side effects of other nerve medications. Discussed that there may be pain, itching, erythema, and decreased sensory function associated with the application of Qutenza. Side effects usually subside within 1 week. A cold pack of analgesic medications can help with these side effects. Blood pressure can also be increased due to pain associated with administration of the patch.   Prescribing Home Zynex NexWave Stimulator Device and supplies as needed. IFC, NMES and TENS medically necessary Treatment Rx: Daily @ 30-40 minutes per treatment PRN. Zynex NexWave only, no substitutions. Treatment Goals: 1) To reduce and/or eliminate pain 2) To improve functional capacity and Activities of daily living 3) To reduce or prevent the need for oral medications 4) To improve circulation in the injured region 5) To decrease or prevent muscle spasm and muscle atrophy 6) To provide a self-management tool to the patient The patient has not sufficiently improved with conservative care. Numerous studies indexed by Medline and PubMed.gov have shown Neuromuscular, Interferential, and TENS stimulators to reduce pain, improve function, and reduce medication use in injured patients. Continued use of this evidence based, safe, drug free treatment is both reasonable and medically necessary at this time.   3) Left knee pain -discussed present in posterior fossa and can be due to gait impairment stemming from her right hip/buttock pain -recommended daily turmeric with black pepper -discussed ref light therapy -continue voltaren -recommended knee   4) Left ankle  pain: -advised wearing compression garment -prescribed PT -discussed that Journavx is a highly selective inhibitor for Nav 1.8, which is specific for pain in the peripheral nervous system, discussed that lidocaine  in contrast affects all Nav receptors, discussed loading dose is 2 50mg  tablets and this can  be taken 1 hour before food, discussed that then 50mg  can be taken with out without food q12H until pain is tolerable. Discussed that potential side effects are pruritus, muscle spasms, increase blood creatinine, rash. Discussed that we have samples available and we have copay cards available. Discussed that the 2 phase 3 clinicial trials sent to the FDA were for abdominoplasty and bunionectomy, discussed that it has been currently studied for 14 days, discussed that it can reduce efficacy of opioids and benzodiazepines, discussed that it has not be studied in severe hepatic impairment. Discussed that it is contraindicated with fluconazole. Discussed that it can interfere with hormonal contraception up to 28 days after use. Discussed that it can decrease fertility, it has not been studied in pregnancy, that it has been present in animal milk during lactation, not recommended for GFR <15, discussed that outpatient if the medication requires a prior auth the copay should be $30 for at least a 60 day supply. The medication may be more likely to be in stock in [facility] and [facility]. We do have samples available

## 2023-08-06 NOTE — Patient Instructions (Addendum)
 Red light therapy [place], [place]  D3 1,000U daily

## 2023-08-09 ENCOUNTER — Telehealth: Payer: Self-pay

## 2023-08-09 NOTE — Telephone Encounter (Signed)
 Qutenza patch denied. Can appeal? Denial will be in your review folder.

## 2023-08-13 NOTE — Progress Notes (Unsigned)
 [address]       [place] [address]             [phone]        Cynthia [name] [id number] 1941/02/28  History of Present Illness: Cynthia [name] is an 83 year old with a past medical history of hypertension, hyperlipidemia, hyperthyroidism, paroxysmal atrial fibrillation on Eliquis , OSA, GERD, and GI bleed requiring transfusion in 2019. She was incidentally found to have a 4.1cm on CTA for PE workup due to shortness of breath. She was seen in our office last year and reported occasional chest pain that resolves with nitroglycerin  and dizzy spells.   She denies personal history of ATAA and personal history of connective tissue disorder.  Today she reports ***   Current Outpatient Medications on File Prior to Visit  Medication Sig Dispense Refill   acetaminophen  (TYLENOL ) 500 MG tablet Take 1,000 mg by mouth at bedtime.     Alpha-Lipoic Acid (LIPOIC ACID PO) Take 600 mg by mouth daily.     apixaban  (ELIQUIS ) 5 MG TABS tablet TAKE ONE (1) TABLET BY MOUTH TWICE DAILY *NEW PRESCRIPTION REQUEST* 180 tablet 3   atorvastatin  (LIPITOR) 10 MG tablet Take 1 tablet (10 mg total) by mouth daily. 90 tablet 3   AZO-CRANBERRY PO Take 1 tablet by mouth daily.     Calcium  Carbonate-Vitamin D3 600-400 MG-UNIT TABS Take 1 tablet by mouth daily.     Cephalexin  250 MG tablet Take 250 mg by mouth daily.     cholecalciferol  (VITAMIN D3) 25 MCG (1000 UNIT) tablet Take 1,000 Units by mouth daily.     denosumab  (PROLIA ) 60 MG/ML SOSY injection Inject 60 mg into the skin every 6 (six) months.     desmopressin  (DDAVP ) 0.2 MG tablet Take 3 tablets by mouth as needed (Uriniary tract).     diltiazem  (CARDIZEM  CD) 180 MG 24 hr capsule Take 1 capsule (180 mg total) by mouth daily. 90 capsule 3   donepezil  (ARICEPT ) 5 MG tablet Take 1 tablet (5 mg total) by mouth at bedtime. 90 tablet 3   estradiol (ESTRACE) 0.1 MG/GM vaginal cream APPLY A FINGER LENGTH TIP AMOUNT TO THE URETHRA THREE TIMES PER  WEEK for 14     fluticasone  (FLONASE ) 50 MCG/ACT nasal spray Place 1 spray into the nose at bedtime as needed for allergies.     GEMTESA 75 MG TABS Take 1 tablet by mouth daily.     lidocaine  (LIDODERM ) 5 % Place 1 patch onto the skin daily. Remove & Discard patch within 12 hours or as directed by MD 30 patch 0   losartan  (COZAAR ) 100 MG tablet Take 50 mg by mouth every evening.     meclizine  (ANTIVERT ) 25 MG tablet Take 25 mg by mouth daily as needed for dizziness.     Melatonin 5 MG CAPS Take 10 mg by mouth at bedtime.     multivitamin-iron-minerals-folic acid (CENTRUM) chewable tablet Chew 1 tablet by mouth daily.     nitroGLYCERIN  (NITROLINGUAL ) 0.4 MG/SPRAY spray Place 1 spray under the tongue every 5 (five) minutes x 3 doses as needed for chest pain. 12 g 3   nitroGLYCERIN  (NITROSTAT ) 0.4 MG SL tablet Place under the tongue.     Omega-3 1000 MG CAPS Take 2,000 mg by mouth daily.      ondansetron  (ZOFRAN ) 4 MG tablet Take 4 mg by mouth every 8 (eight) hours as needed for nausea or vomiting.  pantoprazole  (PROTONIX ) 40 MG tablet Take 40 mg by mouth daily before breakfast.   1   Pediatric Multiple Vitamins (FLINTSTONES MULTIVITAMIN) CHEW Chew 1 tablet by mouth daily.     sertraline  (ZOLOFT ) 25 MG tablet Take 25 mg by mouth daily.     Suzetrigine (JOURNAVX) 50 MG TABS Take 1 tablet by mouth 2 (two) times daily as needed. 60 tablet 0   thyroid  (ARMOUR) 60 MG tablet Take 60 mg by mouth daily before breakfast.     traMADol  (ULTRAM ) 50 MG tablet Take 1 tablet (50 mg total) by mouth every 6 (six) hours as needed. 120 tablet 5   vitamin C  (ASCORBIC ACID ) 500 MG tablet Take 500 mg by mouth daily.     No current facility-administered medications on file prior to visit.   Vitals:  Physical Exam  CTA Results: CLINICAL DATA:  Follow-up thoracic aortic aneurysm   EXAM: CT ANGIOGRAPHY CHEST WITH CONTRAST   TECHNIQUE: Multidetector CT imaging of the chest was performed using the standard  protocol during bolus administration of intravenous contrast. Multiplanar CT image reconstructions and MIPs were obtained to evaluate the vascular anatomy.   RADIATION DOSE REDUCTION: This exam was performed according to the departmental dose-optimization program which includes automated exposure control, adjustment of the mA and/or kV according to patient size and/or use of iterative reconstruction technique.   CONTRAST:  75mL ISOVUE -370 IOPAMIDOL  (ISOVUE -370) INJECTION 76%   COMPARISON:  04/07/2023   FINDINGS: Cardiovascular: Satisfactory opacification of pulmonary arteries noted, and there is no evidence of pulmonary emboli. Coarse mitral annulus calcifications. Good contrast opacification of the thoracic aorta, without dissection or stenosis. Scattered calcified plaque in the arch and descending thoracic segment.   Aortic Root:   --Valve: 2.5 cm cm   --Sinuses: 3.2 cm cm   --Sinotubular Junction: 2.9 cm cm   Limitations by motion: Moderate   Thoracic Aorta:   --Ascending Aorta: 4.2 cm (previously 4.2)   --Aortic Arch: 3.7 cm   --Descending Aorta: 2.9 cm   Mediastinum/Nodes: No mass, adenopathy, or hematoma.   Lungs/Pleura: No pleural effusion. No pneumothorax. Dependent atelectasis posteriorly in the lung bases.   Upper Abdomen: Fibromuscular dysplasia effect in the right renal artery, with an 8 mm aneurysm near its bifurcation. No acute findings.   Musculoskeletal: Stable s-shaped scoliosis of the thoracic spine with multilevel spondylitic change. Mild superior endplate compression deformity of T11 stable. Bilateral shoulder DJD.   Review of the MIP images confirms the above findings.   IMPRESSION: 1. Stable 4.2 cm ascending thoracic aortic aneurysm. Recommend follow-up CT/MR every 12 months and vascular consultation. 2. 8 mm right renal artery aneurysm, stable since 11/06/2017.     Electronically Signed   By: Nicoletta Barrier M.D.   On: 07/20/2023  13:19     Impression and Plan: ATAA: Cynthia [name] presents to the clinic with a *** cm ascending aortic aneurysm.  Echocardiogram shows a *** valve without evidence of regurgitation.  We discussed the natural history and and risk factors for growth of ascending aortic aneurysms.  We covered the importance of smoking cessation, tight blood pressure control, refraining from lifting heavy objects, and avoiding fluoroquinolones.  The patient is aware of signs and symptoms of aortic dissection and when to present to the emergency department.  We will continue surveillance and a repeat ** was ordered for ***.    Risk Modification:  Statin:  ***  Smoking cessation instruction/counseling given:  {CHL AMB [facility] SMOKING CESSATION COUNSELING:20758}  Patient was counseled on  importance of Blood Pressure Control.  Despite Medical intervention if the patient notices persistently elevated blood pressure readings.  They are instructed to contact their Primary Care Physician  Please avoid use of Fluoroquinolones as this can potentially increase your risk of Aortic Rupture and/or Dissection  Patient educated on signs and symptoms of Aortic Dissection, handout also provided in AVS  Randa Burton, PA-C 08/13/23

## 2023-08-17 ENCOUNTER — Ambulatory Visit (INDEPENDENT_AMBULATORY_CARE_PROVIDER_SITE_OTHER): Payer: Medicare PPO

## 2023-08-17 DIAGNOSIS — R55 Syncope and collapse: Secondary | ICD-10-CM

## 2023-08-17 LAB — CUP PACEART REMOTE DEVICE CHECK
Date Time Interrogation Session: 20250506080149
Implantable Pulse Generator Implant Date: 20220210
Pulse Gen Model: 436066
Pulse Gen Serial Number: 94061742

## 2023-08-19 NOTE — Patient Instructions (Signed)

## 2023-08-24 ENCOUNTER — Ambulatory Visit

## 2023-08-25 ENCOUNTER — Encounter: Payer: Self-pay | Admitting: Thoracic Surgery (Cardiothoracic Vascular Surgery)

## 2023-08-26 NOTE — Progress Notes (Signed)
 Carelink Summary Report / Loop Recorder

## 2023-08-31 ENCOUNTER — Ambulatory Visit: Attending: Physical Medicine and Rehabilitation | Admitting: Physical Therapy

## 2023-08-31 DIAGNOSIS — R2689 Other abnormalities of gait and mobility: Secondary | ICD-10-CM | POA: Insufficient documentation

## 2023-08-31 DIAGNOSIS — R293 Abnormal posture: Secondary | ICD-10-CM | POA: Insufficient documentation

## 2023-08-31 DIAGNOSIS — R2681 Unsteadiness on feet: Secondary | ICD-10-CM | POA: Insufficient documentation

## 2023-08-31 DIAGNOSIS — M25572 Pain in left ankle and joints of left foot: Secondary | ICD-10-CM | POA: Insufficient documentation

## 2023-08-31 DIAGNOSIS — M6281 Muscle weakness (generalized): Secondary | ICD-10-CM | POA: Insufficient documentation

## 2023-08-31 NOTE — Therapy (Incomplete)
 OUTPATIENT PHYSICAL THERAPY LOWER EXTREMITY EVALUATION   Patient Name: Cynthia [name] MRN: [id number] DOB:[birth year], 83 y.o., female Today's Date: 08/31/2023  END OF SESSION:   Past Medical History:  Diagnosis Date   Arthritis    "joints" (11/10/2017)   Dysrhythmia    afib   GERD (gastroesophageal reflux disease)    GI bleed 11/06/2017   Hearing loss    History of blood transfusion 10/2017   Hyperlipidemia    Hypertension    Hyperthyroidism    Loop recorder Bi[employer] Loop Recorder 05/23/2020 05/23/2020   Melanoma (HCC)    "cut off my back"   Memory loss    Pneumonia    Sleep apnea    cpap   Past Surgical History:  Procedure Laterality Date   ABDOMINAL HYSTERECTOMY     "partial"   APPENDECTOMY     BIOPSY  11/17/2017   Procedure: BIOPSY;  Surgeon: Ozell Blunt, MD;  Location: [facility];  Service: Endoscopy;;   COLONOSCOPY WITH PROPOFOL  N/A 11/17/2017   Procedure: COLONOSCOPY WITH PROPOFOL ;  Surgeon: Ozell Blunt, MD;  Location: [facility];  Service: Endoscopy;  Laterality: N/A;   CRANIECTOMY FOR EXCISION OF ACOUSTIC NEUROMA     ESOPHAGOGASTRODUODENOSCOPY (EGD) WITH PROPOFOL  N/A 10/28/2017   Procedure: ESOPHAGOGASTRODUODENOSCOPY (EGD) WITH PROPOFOL ;  Surgeon: Ozell Blunt, MD;  Location: [facility];  Service: Endoscopy;  Laterality: N/A;   FOOT SURGERY Bilateral 04/18/2020   KNEE ARTHROPLASTY Right 06/05/2021   Procedure: COMPUTER ASSISTED TOTAL KNEE ARTHROPLASTY;  Surgeon: Adonica Hoose, MD;  Location: [facility];  Service: Orthopedics;  Laterality: Right;   KNEE ARTHROSCOPY Left    LEFT HEART CATH AND CORONARY ANGIOGRAPHY N/A 11/24/2016   Procedure: LEFT HEART CATH AND CORONARY ANGIOGRAPHY;  Surgeon: Lucendia Rusk, MD;  Location: [facility] INVASIVE CV LAB;  Service: Cardiovascular;  Laterality: N/A;   MELANOMA EXCISION     "off my back"   NASAL SINUS SURGERY     TONSILLECTOMY     Patient Active Problem List   Diagnosis Date Noted   Syncope 04/07/2023   Precordial  pain 07/19/2022   Coronary artery calcification seen on computed tomography 07/19/2022   Aneurysm of ascending aorta without rupture (HCC) 07/19/2022   Degenerative arthritis of right knee 06/05/2021   Osteoarthritis of right knee 06/05/2021   NPH (normal pressure hydrocephalus) (HCC) 08/19/2020   Urinary urgency 08/19/2020   Encounter for loop recorder check 06/09/2020   Loop recorder Bi[employer]ik Loop Recorder 05/23/2020 05/23/2020   Paroxysmal atrial fibrillation (HCC) 03/24/2020   Gait disturbance 01/29/2020   Periodic limb movement disorder 08/30/2019   Nocturia 07/01/2018   Midline cystocele 06/08/2018   Recurrent UTI 06/08/2018   Vaginal vault prolapse 06/08/2018   Syncope and collapse    Postural dizziness with presyncope 03/17/2018   PAF (paroxysmal atrial fibrillation) (HCC) 03/17/2018   Memory loss 11/25/2017   Abnormal stress test    Hyperlipidemia 11/23/2016   GERD (gastroesophageal reflux disease) 11/23/2016   Polyneuropathy 10/30/2016   Bursitis, subacromial 10/30/2016   Numbness 10/30/2016   Arthritis, senescent 01/03/2016   Pain in joint, ankle and foot 02/28/2015   Dry mouth 08/07/2014   Dry eyes 08/07/2014   Other headache syndrome 06/25/2014   Sinusitis, chronic 06/25/2014   Neck pain 06/25/2014   Obstructive sleep apnea 06/25/2014   Essential hypertension, benign 06/25/2014    PCP: Schuyler Custard, MD  REFERRING PROVIDER: Alessandra Ancona, MD  REFERRING DIAG: left ankle pain  THERAPY DIAG:  No diagnosis found.  Rationale for Evaluation and Treatment: Rehabilitation  ONSET DATE: 08/06/23  SUBJECTIVE:   SUBJECTIVE STATEMENT: ***  PERTINENT HISTORY: Right TKA PAIN:  Are you having pain? Yes: NPRS scale: *** Pain location: *** Pain description: *** Aggravating factors: *** Relieving factors: ***  PRECAUTIONS: {Therapy precautions:24002}  RED FLAGS: {PT Red Flags:29287}   WEIGHT BEARING RESTRICTIONS: {Yes ***/No:24003}  FALLS:  Has patient fallen in  last 6 months? {fallsyesno:27318}  LIVING ENVIRONMENT: Lives with: {OPRC lives with:25569::"lives with their family"} Lives in: {Lives in:25570} Stairs: {opstairs:27293} Has following equipment at home: {Assistive devices:23999}  OCCUPATION: ***  PLOF: {PLOF:24004}  PATIENT GOALS: ***  NEXT MD VISIT: ***  OBJECTIVE:  Note: Objective measures were completed at Evaluation unless otherwise noted.  DIAGNOSTIC FINDINGS: ***  PATIENT SURVEYS:  {rehab surveys:24030}  COGNITION: Overall cognitive status: {cognition:24006}     SENSATION: {sensation:27233}  EDEMA:  {edema:24020}  MUSCLE LENGTH: Hamstrings: Right *** deg; Left *** deg Andy Bannister test: Right *** deg; Left *** deg  POSTURE: {posture:25561}  PALPATION: ***  LOWER EXTREMITY ROM:  Active ROM Right eval Left eval  Hip flexion    Hip extension    Hip abduction    Hip adduction    Hip internal rotation    Hip external rotation    Knee flexion    Knee extension    Ankle dorsiflexion    Ankle plantarflexion    Ankle inversion    Ankle eversion     (Blank rows = not tested)  LOWER EXTREMITY MMT:  MMT Right eval Left eval  Hip flexion    Hip extension    Hip abduction    Hip adduction    Hip internal rotation    Hip external rotation    Knee flexion    Knee extension    Ankle dorsiflexion    Ankle plantarflexion    Ankle inversion    Ankle eversion     (Blank rows = not tested)  LOWER EXTREMITY SPECIAL TESTS:  Ankle special tests: {ANKLE SPECIAL ZOXWR:60454}  FUNCTIONAL TESTS:  Timed up and go (TUG): *** Berg Balance Scale: ***  GAIT: Distance walked: *** Assistive device utilized: {Assistive devices:23999} Level of assistance: {Levels of assistance:24026} Comments: ***                                                                                                                                TREATMENT DATE: ***    PATIENT EDUCATION:  Education details: *** Person educated:  {Person educated:25204} Education method: {Education Method:25205} Education comprehension: {Education Comprehension:25206}  HOME EXERCISE PROGRAM: ***  ASSESSMENT:  CLINICAL IMPRESSION: Patient is a 83 y.o. female who was seen today for physical therapy evaluation and treatment for ***.   OBJECTIVE IMPAIRMENTS: Abnormal gait, cardiopulmonary status limiting activity, decreased activity tolerance, decreased balance, decreased coordination, decreased endurance, decreased mobility, difficulty walking, decreased ROM, decreased strength, increased edema, increased muscle spasms, impaired flexibility, improper body mechanics, and pain.   REHAB POTENTIAL: Good  CLINICAL DECISION MAKING: Evolving/moderate  complexity  EVALUATION COMPLEXITY: Moderate   GOALS: Goals reviewed with patient? Yes  SHORT TERM GOALS: Target date: 09/23/23 Independent with initial HEP Baseline: Goal status: INITIAL  LONG TERM GOALS: Target date: ***  *** Baseline:  Goal status: INITIAL  2.  *** Baseline:  Goal status: INITIAL  3.  *** Baseline:  Goal status: INITIAL  4.  *** Baseline:  Goal status: INITIAL  5.  *** Baseline:  Goal status: INITIAL  6.  *** Baseline:  Goal status: INITIAL   PLAN:  PT FREQUENCY: {rehab frequency:25116}  PT DURATION: {rehab duration:25117}  PLANNED INTERVENTIONS: {rehab planned interventions:25118::"97110-Therapeutic exercises","97530- Therapeutic [phone]- Neuromuscular re-education","97535- Self UXLK","44010- Manual therapy"}  PLAN FOR NEXT SESSION: Hollis Lurie, PT 08/31/2023, 1:09 PM

## 2023-09-01 ENCOUNTER — Ambulatory Visit

## 2023-09-01 ENCOUNTER — Telehealth: Payer: Self-pay | Admitting: *Deleted

## 2023-09-01 DIAGNOSIS — R2689 Other abnormalities of gait and mobility: Secondary | ICD-10-CM

## 2023-09-01 DIAGNOSIS — M6281 Muscle weakness (generalized): Secondary | ICD-10-CM

## 2023-09-01 DIAGNOSIS — R293 Abnormal posture: Secondary | ICD-10-CM | POA: Diagnosis not present

## 2023-09-01 DIAGNOSIS — M25572 Pain in left ankle and joints of left foot: Secondary | ICD-10-CM | POA: Diagnosis not present

## 2023-09-01 DIAGNOSIS — R2681 Unsteadiness on feet: Secondary | ICD-10-CM

## 2023-09-01 NOTE — Therapy (Signed)
 [facility] OUTPATIENT PHYSICAL THERAPY LOWER EXTREMITY EVALUATION   Patient Name: Cynthia [name] MRN: [id number] DOB:08-26-1940, 83 y.o., female Today's Date: 09/01/2023  END OF SESSION:   Past Medical History:  Diagnosis Date   Arthritis    "joints" (11/10/2017)   Dysrhythmia    afib   GERD (gastroesophageal reflux disease)    GI bleed 11/06/2017   Hearing loss    History of blood transfusion 10/2017   Hyperlipidemia    Hypertension    Hyperthyroidism    Loop recorder [employer] Loop Recorder 05/23/2020 05/23/2020   Melanoma (HCC)    "cut off my back"   Memory loss    Pneumonia    Sleep apnea    cpap   Past Surgical History:  Procedure Laterality Date   ABDOMINAL HYSTERECTOMY     "partial"   APPENDECTOMY     BIOPSY  11/17/2017   Procedure: BIOPSY;  Surgeon: Ozell Blunt, MD;  Location: [facility];  Service: Endoscopy;;   COLONOSCOPY WITH PROPOFOL  N/A 11/17/2017   Procedure: COLONOSCOPY WITH PROPOFOL ;  Surgeon: Ozell Blunt, MD;  Location: [facility];  Service: Endoscopy;  Laterality: N/A;   CRANIECTOMY FOR EXCISION OF ACOUSTIC NEUROMA     ESOPHAGOGASTRODUODENOSCOPY (EGD) WITH PROPOFOL  N/A 10/28/2017   Procedure: ESOPHAGOGASTRODUODENOSCOPY (EGD) WITH PROPOFOL ;  Surgeon: Ozell Blunt, MD;  Location: [facility];  Service: Endoscopy;  Laterality: N/A;   FOOT SURGERY Bilateral 04/18/2020   KNEE ARTHROPLASTY Right 06/05/2021   Procedure: COMPUTER ASSISTED TOTAL KNEE ARTHROPLASTY;  Surgeon: Adonica Hoose, MD;  Location: [facility];  Service: Orthopedics;  Laterality: Right;   KNEE ARTHROSCOPY Left    LEFT HEART CATH AND CORONARY ANGIOGRAPHY N/A 11/24/2016   Procedure: LEFT HEART CATH AND CORONARY ANGIOGRAPHY;  Surgeon: Lucendia Rusk, MD;  Location: [facility] INVASIVE CV LAB;  Service: Cardiovascular;  Laterality: N/A;   MELANOMA EXCISION     "off my back"   NASAL SINUS SURGERY     TONSILLECTOMY     Patient Active Problem List   Diagnosis Date Noted   Syncope 04/07/2023   Precordial  pain 07/19/2022   Coronary artery calcification seen on computed tomography 07/19/2022   Aneurysm of ascending aorta without rupture (HCC) 07/19/2022   Degenerative arthritis of right knee 06/05/2021   Osteoarthritis of right knee 06/05/2021   NPH (normal pressure hydrocephalus) (HCC) 08/19/2020   Urinary urgency 08/19/2020   Encounter for loop recorder check 06/09/2020   Loop recorder Bi[employer]ik Loop Recorder 05/23/2020 05/23/2020   Paroxysmal atrial fibrillation (HCC) 03/24/2020   Gait disturbance 01/29/2020   Periodic limb movement disorder 08/30/2019   Nocturia 07/01/2018   Midline cystocele 06/08/2018   Recurrent UTI 06/08/2018   Vaginal vault prolapse 06/08/2018   Syncope and collapse    Postural dizziness with presyncope 03/17/2018   PAF (paroxysmal atrial fibrillation) (HCC) 03/17/2018   Memory loss 11/25/2017   Abnormal stress test    Hyperlipidemia 11/23/2016   GERD (gastroesophageal reflux disease) 11/23/2016   Polyneuropathy 10/30/2016   Bursitis, subacromial 10/30/2016   Numbness 10/30/2016   Arthritis, senescent 01/03/2016   Pain in joint, ankle and foot 02/28/2015   Dry mouth 08/07/2014   Dry eyes 08/07/2014   Other headache syndrome 06/25/2014   Sinusitis, chronic 06/25/2014   Neck pain 06/25/2014   Obstructive sleep apnea 06/25/2014   Essential hypertension, benign 06/25/2014    PCP: Schuyler Custard, MD  REFERRING PROVIDER: Alessandra Ancona, MD  REFERRING DIAG: left ankle pain  THERAPY DIAG:  No diagnosis found.  Rationale for Evaluation and Treatment: Rehabilitation  ONSET DATE: 08/06/23  SUBJECTIVE:   SUBJECTIVE STATEMENT: I am in good shape for the shape I am in. I think I twisted my ankle a few weeks ago. But I do not remember twisting it. I attempted to heal it with red light therapy and it may be a little better but still hurts.   PERTINENT HISTORY: Right TKA  PAIN:  Are you having pain? Yes: NPRS scale: 6/10 Pain location: L ankle, lower back pain  Pain  description: dull and achy  Aggravating factors: the more I use it Relieving factors: red light therapy maybe   PRECAUTIONS: Fall  RED FLAGS: None   WEIGHT BEARING RESTRICTIONS: No  FALLS:  Has patient fallen in last 6 months? No  LIVING ENVIRONMENT: Lives with: lives with their family Lives in: House/apartment Stairs: Yes: External: 3 steps; can reach both Has following equipment at home: Single point cane, Walker - 2 wheeled, and Wheelchair (manual)   PLOF: Independent with basic ADLs and Independent with household mobility with device  PATIENT GOALS: to work on balance   NEXT MD VISIT: 11/08/23  OBJECTIVE:  Note: Objective measures were completed at Evaluation unless otherwise noted.  DIAGNOSTIC FINDINGS: No fracture to ankle   COGNITION: Overall cognitive status: Within functional limits for tasks assessed and some issues with memory     SENSATION: WFL   POSTURE: rounded shoulders, forward head, increased thoracic kyphosis, and flexed trunk   PALPATION: Some tenderness around L ankle, more tender medially   LOWER EXTREMITY ROM: good overall motion for functional tasks, reports some pulling with L ankle PF  LOWER EXTREMITY MMT: 4+/5 BLE, L ankle eversion and inversion are weak 3+/5    LOWER EXTREMITY SPECIAL TESTS:  Ankle special tests: Anterior drawer test: negative  FUNCTIONAL TESTS:  5 times sit to stand: 24s from chair  Timed up and go (TUG): 27s with rolling walker  SLS 1-2s at best   GAIT: Distance walked: in clinic distances  Assistive device utilized: Environmental consultant - 2 wheeled Level of assistance: Modified independence Comments: flexed trunk, decreased knee extension, R foot in ER,                                                                                                                                 TREATMENT DATE: 09/01/23- EVAL    PATIENT EDUCATION:  Education details: POC and HEP Person educated: Patient Education method: Fish farm manager Education comprehension: verbalized understanding, returned demonstration, and needs further education  HOME EXERCISE PROGRAM: Access Code: [email or web address] URL: [email or web address] Date: 09/01/2023 Prepared by: Donavon Fudge  Exercises - Heel Raises with Counter Support  - 1 x daily - 7 x weekly - 2 sets - 10 reps - Ankle Inversion with Resistance  - 1 x daily - 7 x weekly - 2 sets - 10 reps - Ankle Eversion with Resistance  - 1 x daily - 7 x weekly - 2 sets - 10 reps -  Ankle Dorsiflexion with Resistance  - 1 x daily - 7 x weekly - 2 sets - 10 reps - Ankle and Toe Plantarflexion with Resistance  - 1 x daily - 7 x weekly - 2 sets - 10 reps  ASSESSMENT:  CLINICAL IMPRESSION: Patient is a 83 y.o. female who was seen today for physical therapy evaluation and treatment for L ankle pain. She thinks she may have twisted it but does not recall how or when. She presents with some ongoing L ankle pain and has some weakness especially with eversion and inversion. Patient uses a rolling walker to ambulate, and has been for about 2 years now. She reports felling safe with the walker and if she does not use it at home she is holding on to furniture to get around. Patient would like to work on her gait, balance, and strength to be able to get around with more confidence and get rid of her pain.   OBJECTIVE IMPAIRMENTS: Abnormal gait, cardiopulmonary status limiting activity, decreased activity tolerance, decreased balance, decreased coordination, decreased endurance, decreased mobility, difficulty walking, decreased ROM, decreased strength, increased edema, increased muscle spasms, impaired flexibility, improper body mechanics, and pain.   REHAB POTENTIAL: Good  CLINICAL DECISION MAKING: Evolving/moderate complexity  EVALUATION COMPLEXITY: Moderate   GOALS: Goals reviewed with patient? Yes  SHORT TERM GOALS: Target date: 10/06/23  Patient will be independent with initial  HEP. Baseline:  Goal status: INITIAL   2.  Patient will demonstrate improved functional LE strength as demonstrated by 5xSTS <15s. Baseline: 24s Goal status: INITIAL   LONG TERM GOALS: Target date: 11/10/23  Patient will be independent with advanced/ongoing HEP to improve outcomes and carryover.  Baseline:  Goal status: INITIAL  2.  Patient will report <3/10 in L ankle/foot pain to improve QOL. Baseline: 6/10 Goal status: INITIAL  3.  Patient will demonstrate 4+/5 or better for L ankle eversion and inversion  Baseline: 3+ Goal status: INITIAL  4.  Patient will be able to ambulate 300' with LRAD and normal gait pattern without increased foot/ankle pain to access community.  Baseline: using RW, flexed knees, R foot ER Goal status: INITIAL  5. Patient will demonstrate TUG <15s Baseline: 27s using RW  Goal status: INITIAL  6.  Patient will demonstrate SLS 5s on BLE Baseline: 1-2s at best  Goal status: INITIAL  PLAN:  PT FREQUENCY: 2x/week  PT DURATION: 10 weeks  PLANNED INTERVENTIONS: 97110-Therapeutic exercises, 97530- Therapeutic activity, 97112- Neuromuscular re-education, 97535- Self Care, 36644- Manual therapy, [phone]- Gait training, Patient/Family education, Balance training, Stair training, Taping, Dry Needling, Joint mobilization, Spinal mobilization, Cryotherapy, and Moist heat  PLAN FOR NEXT SESSION: L ankle strengthening, functional strengthening, working on [employer], PT 09/01/2023, 9:02 AM

## 2023-09-01 NOTE — Telephone Encounter (Signed)
 Alert received from [employer] for HVR which appears to be Afib w/ RVR  Hx of PAF/PAFL on Eliquis  and Cardizem   Will call to assess for symptoms even though it happened at 1 am because it appears to be first HVR alert based on device history

## 2023-09-01 NOTE — Telephone Encounter (Signed)
 Upon further review in chart, pt has hx of AF w/ RVR. None recently noted. Patient was transfer in from Dr. Belma Boxer and is due for yearly apt with EP MD. Will forward to EP scheduling to contact patient for apt. Will close encounter considering short duration.

## 2023-09-02 ENCOUNTER — Other Ambulatory Visit: Payer: Self-pay | Admitting: Nurse Practitioner

## 2023-09-02 DIAGNOSIS — R0789 Other chest pain: Secondary | ICD-10-CM

## 2023-09-07 ENCOUNTER — Ambulatory Visit: Admitting: Physical Therapy

## 2023-09-07 NOTE — Telephone Encounter (Signed)
 Spoke w/ patient to schedule appt for 1 yr f/u on BIO LOOP - patient requested to call me back as she and her husband were getting ready to head out of town, my direct numner was given to patient, 6/5 at 11:45 held with JP ch

## 2023-09-10 ENCOUNTER — Ambulatory Visit: Admitting: Physical Therapy

## 2023-09-13 NOTE — Telephone Encounter (Signed)
 Spoke w/ patient and husband - she is scheduled for 6/12 with Dr. Daneil Dunker for follow up and to establish care with him.

## 2023-09-14 ENCOUNTER — Ambulatory Visit

## 2023-09-15 ENCOUNTER — Other Ambulatory Visit: Payer: Self-pay

## 2023-09-15 ENCOUNTER — Ambulatory Visit: Attending: Physical Medicine and Rehabilitation

## 2023-09-15 DIAGNOSIS — R2681 Unsteadiness on feet: Secondary | ICD-10-CM | POA: Insufficient documentation

## 2023-09-15 DIAGNOSIS — M6281 Muscle weakness (generalized): Secondary | ICD-10-CM | POA: Insufficient documentation

## 2023-09-15 DIAGNOSIS — M25572 Pain in left ankle and joints of left foot: Secondary | ICD-10-CM | POA: Insufficient documentation

## 2023-09-15 DIAGNOSIS — R293 Abnormal posture: Secondary | ICD-10-CM | POA: Diagnosis not present

## 2023-09-15 DIAGNOSIS — R296 Repeated falls: Secondary | ICD-10-CM | POA: Diagnosis not present

## 2023-09-15 NOTE — Therapy (Signed)
 [facility] OUT[facility] PH[facility]ERAPY LOWER EXTREMITY  TREATMENT   Patient Name: Cynthia [name] MRN: [id number] DOB:1941-02-12, 83 y.o., female Today's Date: 09/15/2023  END OF SESSION:  PT End of Session - 09/15/23 1658     Visit Number 2    Date for PT Re-Evaluation 11/10/23    Authorization Type [employer]    Progress Note Due on Visit 10    PT Start Time 1112    PT Stop Time 1147    PT Time Calculation (min) 35 min    Activity Tolerance Patient tolerated treatment well    Behavior During Therapy [facility] for tasks assessed/performed             Past Medical History:  Diagnosis Date   Arthritis    "joints" (11/10/2017)   Dysrhythmia    afib   GERD (gastroesophageal reflux disease)    GI bleed 11/06/2017   Hearing loss    History of blood transfusion 10/2017   Hyperlipidemia    Hypertension    Hyperthyroidism    Loop recorder Biotronik Loop Recorder 05/23/2020 05/23/2020   Melanoma (HCC)    "cut off my back"   Memory loss    Pneumonia    Sleep apnea    cpap   Past Surgical History:  Procedure Laterality Date   ABDOMINAL HYSTERECTOMY     "partial"   APPENDECTOMY     BIOPSY  11/17/2017   Procedure: BIOPSY;  Surgeon: Ozell Blunt, MD;  Location: [facility];  Service: Endoscopy;;   COLONOSCOPY WITH PROPOFOL  N/A 11/17/2017   Procedure: COLONOSCOPY WITH PROPOFOL ;  Surgeon: Ozell Blunt, MD;  Location: [facility];  Service: Endoscopy;  Laterality: N/A;   CRANIECTOMY FOR EXCISION OF ACOUSTIC NEUROMA     ESOPHAGOGASTRODUODENOSCOPY (EGD) WITH PROPOFOL  N/A 10/28/2017   Procedure: ESOPHAGOGASTRODUODENOSCOPY (EGD) WITH PROPOFOL ;  Surgeon: Ozell Blunt, MD;  Location: [facility];  Service: Endoscopy;  Laterality: N/A;   FOOT SURGERY Bilateral 04/18/2020   KNEE ARTHROPLASTY Right 06/05/2021   Procedure: COMPUTER ASSISTED TOTAL KNEE ARTHROPLASTY;  Surgeon: Adonica Hoose, MD;  Location: [facility];  Service: Orthopedics;  Laterality: Right;   KNEE ARTHROSCOPY Left    LEFT HEART CATH AND  CORONARY ANGIOGRAPHY N/A 11/24/2016   Procedure: LEFT HEART CATH AND CORONARY ANGIOGRAPHY;  Surgeon: Lucendia Rusk, MD;  Location: [facility] INVASIVE CV LAB;  Service: Cardiovascular;  Laterality: N/A;   MELANOMA EXCISION     "off my back"   NASAL SINUS SURGERY     TONSILLECTOMY     Patient Active Problem List   Diagnosis Date Noted   Syncope 04/07/2023   Precordial pain 07/19/2022   Coronary artery calcification seen on computed tomography 07/19/2022   Aneurysm of ascending aorta without rupture (HCC) 07/19/2022   Degenerative arthritis of right knee 06/05/2021   Osteoarthritis of right knee 06/05/2021   NPH (normal pressure hydrocephalus) (HCC) 08/19/2020   Urinary urgency 08/19/2020   Encounter for loop recorder check 06/09/2020   Loop recorder Biotronik Loop Recorder 05/23/2020 05/23/2020   Paroxysmal atrial fibrillation (HCC) 03/24/2020   Gait disturbance 01/29/2020   Periodic limb movement disorder 08/30/2019   Nocturia 07/01/2018   Midline cystocele 06/08/2018   Recurrent UTI 06/08/2018   Vaginal vault prolapse 06/08/2018   Syncope and collapse    Postural dizziness with presyncope 03/17/2018   PAF (paroxysmal atrial fibrillation) (HCC) 03/17/2018   Memory loss 11/25/2017   Abnormal stress test    Hyperlipidemia 11/23/2016   GERD (gastroesophageal reflux disease) 11/23/2016   Polyneuropathy 10/30/2016  Bursitis, subacromial 10/30/2016   Numbness 10/30/2016   Arthritis, senescent 01/03/2016   Pain in joint, ankle and foot 02/28/2015   Dry mouth 08/07/2014   Dry eyes 08/07/2014   Other headache syndrome 06/25/2014   Sinusitis, chronic 06/25/2014   Neck pain 06/25/2014   Obstructive sleep apnea 06/25/2014   Essential hypertension, benign 06/25/2014    PCP: Schuyler Custard, MD  REFERRING PROVIDER: Alessandra Ancona, MD  REFERRING DIAG: left ankle pain  THERAPY DIAG:  Pain in left ankle and joints of left foot  Muscle weakness (generalized)  Abnormal posture  Unsteadiness on  feet  Rationale for Evaluation and Treatment: Rehabilitation  ONSET DATE: 08/06/23  SUBJECTIVE:   SUBJECTIVE STATEMENT: L ankle with dull ache, no matter what   PERTINENT HISTORY: Right TKA  PAIN:  Are you having pain? Yes: NPRS scale: 6/10 Pain location: L ankle, lower back pain  Pain description: dull and achy  Aggravating factors: the more I use it Relieving factors: red light therapy maybe   PRECAUTIONS: Fall  RED FLAGS: None   WEIGHT BEARING RESTRICTIONS: No  FALLS:  Has patient fallen in last 6 months? No  LIVING ENVIRONMENT: Lives with: lives with their family Lives in: House/apartment Stairs: Yes: External: 3 steps; can reach both Has following equipment at home: Single point cane, Walker - 2 wheeled, and Wheelchair (manual)   PLOF: Independent with basic ADLs and Independent with household mobility with device  PATIENT GOALS: to work on balance   NEXT MD VISIT: 11/08/23  OBJECTIVE:  Note: Objective measures were completed at Evaluation unless otherwise noted.  DIAGNOSTIC FINDINGS: No fracture to ankle   COGNITION: Overall cognitive status: Within functional limits for tasks assessed and some issues with memory     SENSATION: WFL   POSTURE: rounded shoulders, forward head, increased thoracic kyphosis, and flexed trunk   PALPATION: Some tenderness around L ankle, more tender medially   LOWER EXTREMITY ROM: good overall motion for functional tasks, reports some pulling with L ankle PF  LOWER EXTREMITY MMT: 4+/5 BLE, L ankle eversion and inversion are weak 3+/5    LOWER EXTREMITY SPECIAL TESTS:  Ankle special tests: Anterior drawer test: negative  FUNCTIONAL TESTS:  5 times sit to stand: 24s from chair  Timed up and go (TUG): 27s with rolling walker  SLS 1-2s at best   GAIT: Distance walked: in clinic distances  Assistive device utilized: Environmental consultant - 2 wheeled Level of assistance: Modified independence Comments: flexed trunk, decreased  knee extension, R foot in ER,                                                                                                                                 TREATMENT DATE:  09/15/23:  Arrived 11 min late. Nustep level 5 x 6 min Reviewed and practiced home exercises, including red t band IN, EV, plantarflexion, all 15 reps.  Needed much tactile and verbal cuing to isolate and engage L  ankle musculature Standing heel raises attempted within walker but pt very painful Sit to stand from chair with airex in seat, with green theraband resistance around B knees to engage lat hip and ankle stabilizers  Kinesiotaping applied L ankle, 2 pieces extending from metatarsals to post ankle and gastro heads    09/01/23- EVAL    PATIENT EDUCATION:  Education details: POC and HEP Person educated: Patient Education method: Medical illustrator Education comprehension: verbalized understanding, returned demonstration, and needs further education  HOME EXERCISE PROGRAM: Access Code: [email or web address] URL: [email or web address] Date: 09/01/2023 Prepared by: Donavon Fudge  Exercises - Heel Raises with Counter Support  - 1 x daily - 7 x weekly - 2 sets - 10 reps - Ankle Inversion with Resistance  - 1 x daily - 7 x weekly - 2 sets - 10 reps - Ankle Eversion with Resistance  - 1 x daily - 7 x weekly - 2 sets - 10 reps - Ankle Dorsiflexion with Resistance  - 1 x daily - 7 x weekly - 2 sets - 10 reps - Ankle and Toe Plantarflexion with Resistance  - 1 x daily - 7 x weekly - 2 sets - 10 reps  ASSESSMENT:  CLINICAL IMPRESSION: Patient is a 83 y.o. female who participated today with  physical therapy treatment for L ankle pain. She thinks she may have twisted it but does not recall how or when.     She could not recall specifically her home program from initial visit so reviewed and adapted.    Also recommended to her possibly obtaining ankle sleeve if the kinesiotaping is helpful. Patient would  like to work on her gait, balance, and strength to be able to get around with more confidence and get rid of her pain. She should continue to benefit from skilled PT to address her pain and stability L ankle  OBJECTIVE IMPAIRMENTS: Abnormal gait, cardiopulmonary status limiting activity, decreased activity tolerance, decreased balance, decreased coordination, decreased endurance, decreased mobility, difficulty walking, decreased ROM, decreased strength, increased edema, increased muscle spasms, impaired flexibility, improper body mechanics, and pain.   REHAB POTENTIAL: Good  CLINICAL DECISION MAKING: Evolving/moderate complexity  EVALUATION COMPLEXITY: Moderate   GOALS: Goals reviewed with patient? Yes  SHORT TERM GOALS: Target date: 10/06/23  Patient will be independent with initial HEP. Baseline:  Goal status: INITIAL   2.  Patient will demonstrate improved functional LE strength as demonstrated by 5xSTS <15s. Baseline: 24s Goal status: INITIAL   LONG TERM GOALS: Target date: 11/10/23  Patient will be independent with advanced/ongoing HEP to improve outcomes and carryover.  Baseline:  Goal status: INITIAL  2.  Patient will report <3/10 in L ankle/foot pain to improve QOL. Baseline: 6/10 Goal status: INITIAL  3.  Patient will demonstrate 4+/5 or better for L ankle eversion and inversion  Baseline: 3+ Goal status: INITIAL  4.  Patient will be able to ambulate 300' with LRAD and normal gait pattern without increased foot/ankle pain to access community.  Baseline: using RW, flexed knees, R foot ER Goal status: INITIAL  5. Patient will demonstrate TUG <15s Baseline: 27s using RW  Goal status: INITIAL  6.  Patient will demonstrate SLS 5s on BLE Baseline: 1-2s at best  Goal status: INITIAL  PLAN:  PT FREQUENCY: 2x/week  PT DURATION: 10 weeks  PLANNED INTERVENTIONS: 97110-Therapeutic exercises, 97530- Therapeutic activity, 97112- Neuromuscular re-education, 97535-  Self Care, 41660- Manual therapy, [phone]- Gait training, Patient/Family education, Balance training, Stair training, Taping, Dry Needling, Joint mobilization,  Spinal mobilization, Cryotherapy, and Moist heat  PLAN FOR NEXT SESSION: L ankle strengthening, functional strengthening, working on balance    Chamia Schmutz L Misao Fackrell, PT, DPT, OCS 09/15/2023, 5:10 PM

## 2023-09-17 ENCOUNTER — Ambulatory Visit: Admitting: Physical Therapy

## 2023-09-20 ENCOUNTER — Ambulatory Visit: Admitting: Physical Therapy

## 2023-09-21 ENCOUNTER — Ambulatory Visit (INDEPENDENT_AMBULATORY_CARE_PROVIDER_SITE_OTHER): Payer: Medicare PPO

## 2023-09-21 DIAGNOSIS — R3911 Hesitancy of micturition: Secondary | ICD-10-CM | POA: Diagnosis not present

## 2023-09-21 DIAGNOSIS — R55 Syncope and collapse: Secondary | ICD-10-CM | POA: Diagnosis not present

## 2023-09-21 DIAGNOSIS — R350 Frequency of micturition: Secondary | ICD-10-CM | POA: Diagnosis not present

## 2023-09-21 DIAGNOSIS — N3020 Other chronic cystitis without hematuria: Secondary | ICD-10-CM | POA: Diagnosis not present

## 2023-09-21 DIAGNOSIS — N3946 Mixed incontinence: Secondary | ICD-10-CM | POA: Diagnosis not present

## 2023-09-22 LAB — CUP PACEART REMOTE DEVICE CHECK
Date Time Interrogation Session: 20250610145104
Implantable Pulse Generator Implant Date: 20220210
Pulse Gen Model: 436066
Pulse Gen Serial Number: 94061742

## 2023-09-22 NOTE — Progress Notes (Signed)
 Electrophysiology Office Note:   Date:  09/24/2023  ID:  Cynthia [name], DOB [birth year], MRN [id number]  Primary Cardiologist: Knox Perl, MD Electrophysiologist: Ardeen Kohler, MD      History of Present Illness:   Cynthia [name] is a 83 y.o. female with h/o nonobstructive CAD with PEA arrest later found to be related to vasovagal syncope with bradycardia in 2019, paroxysmal AF (on Eliquis ), orthostatic hypotension, OSA (on CPAP), HTN, recurrent UTIs who is being seen today to establish care for her loop recorder.   Discussed the use of AI scribe software for clinical note transcription with the patient, who gave verbal consent to proceed.  She has a history of atrial fibrillation and is currently being monitored with a loop recorder. Recent data from the device indicates she is spending more time in normal rhythm than in atrial fibrillation. Over the past month, she has not experienced any significant episodes..She is currently on Eliquis  and diltiazem  for atrial fibrillation. No symptoms of atrial fibrillation have been felt, and she has not experienced any issues with her current medications.  She reports doing relatively well.  And has no new or acute complaints today.  Review of systems complete and found to be negative unless listed in HPI.   EP Information / Studies Reviewed:    EKG is ordered today. Personal review as below.  EKG Interpretation Date/Time:  Thursday September 23 2023 11:12:32 EDT Ventricular Rate:  63 PR Interval:  170 QRS Duration:  102 QT Interval:  448 QTC Calculation: 458 R Axis:   -79  Text Interpretation: Normal sinus rhythm Left anterior fascicular block Cannot rule out Anterior infarct , age undetermined When compared with ECG of 07-Apr-2023 12:10, No significant change since Confirmed by Ardeen Kohler [phone] on 09/23/2023 11:39:34 AM   EKG 06/08/21: AF   Echo 03/2023:   1. Left ventricular ejection fraction, by estimation, is 60 to 65%. The  left  ventricle has normal function. The left ventricle has no regional  wall motion abnormalities. Left ventricular diastolic parameters are  indeterminate.   2. Right ventricular systolic function is normal. The right ventricular  size is normal. There is normal pulmonary artery systolic pressure.   3. Left atrial size was mildly dilated.   4. The mitral valve is normal in structure. Mild mitral valve  regurgitation. No evidence of mitral stenosis. The mean mitral valve  gradient is 2.0 mmHg. Severe mitral annular calcification.   5. The aortic valve is calcified. There is moderate calcification of the  aortic valve. There is moderate thickening of the aortic valve. Aortic  valve regurgitation is not visualized. Aortic valve sclerosis is present,  with no evidence of aortic valve  stenosis. Aortic valve mean gradient measures 3.0 mmHg. Aortic valve Vmax  measures 1.43 m/s.   6. The inferior vena cava is normal in size with greater than 50%  respiratory variability, suggesting right atrial pressure of 3 mmHg.   Risk Assessment/Calculations:    CHA2DS2-VASc Score = 4   This indicates a 4.8% annual risk of stroke. The patient's score is based upon: CHF History: 0 HTN History: 1 Diabetes History: 0 Stroke History: 0 Vascular Disease History: 0 Age Score: 2 Gender Score: 1         Physical Exam:   VS:  BP (!) 147/81   Pulse 63   Ht 5' 5 (1.651 m)   Wt 155 lb (70.3 kg)   SpO2 95%   BMI 25.79 kg/m    Wt  Readings from Last 3 Encounters:  09/23/23 155 lb (70.3 kg)  08/06/23 150 lb (68 kg)  05/21/23 151 lb (68.5 kg)     GEN: Well nourished, well developed in no acute distress NECK: No JVD CARDIAC: Normal rate, regular rhythm. RESPIRATORY:  Clear to auscultation without rales, wheezing or rhonchi  ABDOMEN: Soft, non-distended EXTREMITIES:  No edema; No deformity   ASSESSMENT AND PLAN:    #. S/p loop recorder: Low burden of AF. #. Paroxysmal atrial fibrillation:  #.  Secondary hypercoagulable state due to AF:  - Continue Eliquis  5 mg twice daily. - Continue diltiazem  180 mg once daily. - Continue remote monitoring.  #Hypertension -Above goal today.  Recommend checking blood pressures 1-2 times per week at home and recording the values.  Recommend bringing these recordings to the primary care physician.  Follow up with Dr. Daneil Dunker in 12 months  Signed, Ardeen Kohler, MD

## 2023-09-23 ENCOUNTER — Ambulatory Visit: Attending: Cardiology | Admitting: Cardiology

## 2023-09-23 ENCOUNTER — Encounter: Payer: Self-pay | Admitting: Cardiology

## 2023-09-23 VITALS — BP 147/81 | HR 63 | Ht 65.0 in | Wt 155.0 lb

## 2023-09-23 DIAGNOSIS — I480 Paroxysmal atrial fibrillation: Secondary | ICD-10-CM | POA: Diagnosis not present

## 2023-09-23 DIAGNOSIS — Z9889 Other specified postprocedural states: Secondary | ICD-10-CM | POA: Diagnosis not present

## 2023-09-23 DIAGNOSIS — I10 Essential (primary) hypertension: Secondary | ICD-10-CM | POA: Diagnosis not present

## 2023-09-23 DIAGNOSIS — D6869 Other thrombophilia: Secondary | ICD-10-CM

## 2023-09-23 NOTE — Patient Instructions (Signed)
 Medication Instructions:  Your physician recommends that you continue on your current medications as directed. Please refer to the Current Medication list given to you today.  *If you need a refill on your cardiac medications before your next appointment, please call your pharmacy*  Follow-Up: At [facility], you and your health needs are our priority.  As part of our continuing mission to provide you with exceptional heart care, our providers are all part of one team.  This team includes your primary Cardiologist (physician) and Advanced Practice Providers or APPs (Physician Assistants and Nurse Practitioners) who all work together to provide you with the care you need, when you need it.  Your next appointment:   1 year  Provider:   You may see Nobie Putnam, MD or one of the following Advanced Practice Providers on your designated Care Team:   Francis Dowse, [place] [address] [place], [place] Sherie Don, NP Canary Brim, NP

## 2023-09-24 ENCOUNTER — Ambulatory Visit

## 2023-09-27 ENCOUNTER — Ambulatory Visit

## 2023-09-29 ENCOUNTER — Encounter: Payer: Self-pay | Admitting: Physical Therapy

## 2023-09-29 ENCOUNTER — Ambulatory Visit: Admitting: Physical Therapy

## 2023-09-29 ENCOUNTER — Ambulatory Visit

## 2023-09-29 DIAGNOSIS — R2681 Unsteadiness on feet: Secondary | ICD-10-CM | POA: Diagnosis not present

## 2023-09-29 DIAGNOSIS — R296 Repeated falls: Secondary | ICD-10-CM | POA: Diagnosis not present

## 2023-09-29 DIAGNOSIS — M25572 Pain in left ankle and joints of left foot: Secondary | ICD-10-CM

## 2023-09-29 DIAGNOSIS — R293 Abnormal posture: Secondary | ICD-10-CM | POA: Diagnosis not present

## 2023-09-29 DIAGNOSIS — M6281 Muscle weakness (generalized): Secondary | ICD-10-CM

## 2023-09-29 NOTE — Therapy (Incomplete)
 [facility] OUTPATIENT PHYSICAL THERAPY LOWER EXTREMITY  TREATMENT   Patient Name: Cynthia [name] MRN: [id number] DOB:[birth year], 83 y.o., female Today's Date: 09/29/2023  END OF SESSION:    Past Medical History:  Diagnosis Date   Arthritis    joints (11/10/2017)   Dysrhythmia    afib   GERD (gastroesophageal reflux disease)    GI bleed 11/06/2017   Hearing loss    History of blood transfusion 10/2017   Hyperlipidemia    Hypertension    Hyperthyroidism    Loop recorder Biotronik Loop Recorder 05/23/2020 05/23/2020   Melanoma (HCC)    cut off my back   Memory loss    Pneumonia    Sleep apnea    cpap   Past Surgical History:  Procedure Laterality Date   ABDOMINAL HYSTERECTOMY     partial   APPENDECTOMY     BIOPSY  11/17/2017   Procedure: BIOPSY;  Surgeon: Ozell Blunt, MD;  Location: [facility];  Service: Endoscopy;;   COLONOSCOPY WITH PROPOFOL  N/A 11/17/2017   Procedure: COLONOSCOPY WITH PROPOFOL ;  Surgeon: Ozell Blunt, MD;  Location: [facility];  Service: Endoscopy;  Laterality: N/A;   CRANIECTOMY FOR EXCISION OF ACOUSTIC NEUROMA     ESOPHAGOGASTRODUODENOSCOPY (EGD) WITH PROPOFOL  N/A 10/28/2017   Procedure: ESOPHAGOGASTRODUODENOSCOPY (EGD) WITH PROPOFOL ;  Surgeon: Ozell Blunt, MD;  Location: [facility];  Service: Endoscopy;  Laterality: N/A;   FOOT SURGERY Bilateral 04/18/2020   KNEE ARTHROPLASTY Right 06/05/2021   Procedure: COMPUTER ASSISTED TOTAL KNEE ARTHROPLASTY;  Surgeon: Adonica Hoose, MD;  Location: [facility];  Service: Orthopedics;  Laterality: Right;   KNEE ARTHROSCOPY Left    LEFT HEART CATH AND CORONARY ANGIOGRAPHY N/A 11/24/2016   Procedure: LEFT HEART CATH AND CORONARY ANGIOGRAPHY;  Surgeon: Lucendia Rusk, MD;  Location: [facility] INVASIVE CV LAB;  Service: Cardiovascular;  Laterality: N/A;   MELANOMA EXCISION     off my back   NASAL SINUS SURGERY     TONSILLECTOMY     Patient Active Problem List   Diagnosis Date Noted   Syncope 04/07/2023    Precordial pain 07/19/2022   Coronary artery calcification seen on computed tomography 07/19/2022   Aneurysm of ascending aorta without rupture (HCC) 07/19/2022   Degenerative arthritis of right knee 06/05/2021   Osteoarthritis of right knee 06/05/2021   NPH (normal pressure hydrocephalus) (HCC) 08/19/2020   Urinary urgency 08/19/2020   Encounter for loop recorder check 06/09/2020   Loop recorder Biotronik Loop Recorder 05/23/2020 05/23/2020   Paroxysmal atrial fibrillation (HCC) 03/24/2020   Gait disturbance 01/29/2020   Periodic limb movement disorder 08/30/2019   Nocturia 07/01/2018   Midline cystocele 06/08/2018   Recurrent UTI 06/08/2018   Vaginal vault prolapse 06/08/2018   Syncope and collapse    Postural dizziness with presyncope 03/17/2018   PAF (paroxysmal atrial fibrillation) (HCC) 03/17/2018   Memory loss 11/25/2017   Abnormal stress test    Hyperlipidemia 11/23/2016   GERD (gastroesophageal reflux disease) 11/23/2016   Polyneuropathy 10/30/2016   Bursitis, subacromial 10/30/2016   Numbness 10/30/2016   Arthritis, senescent 01/03/2016   Pain in joint, ankle and foot 02/28/2015   Dry mouth 08/07/2014   Dry eyes 08/07/2014   Other headache syndrome 06/25/2014   Sinusitis, chronic 06/25/2014   Neck pain 06/25/2014   Obstructive sleep apnea 06/25/2014   Essential hypertension, benign 06/25/2014    PCP: Schuyler Custard, MD  REFERRING PROVIDER: Alessandra Ancona, MD  REFERRING DIAG: left ankle pain  THERAPY DIAG:  No diagnosis found.  Rationale for Evaluation and  Treatment: Rehabilitation  ONSET DATE: 08/06/23  SUBJECTIVE:   SUBJECTIVE STATEMENT: L ankle with dull ache, no matter what   PERTINENT HISTORY: Right TKA  PAIN:  Are you having pain? Yes: NPRS scale: 6/10 Pain location: L ankle, lower back pain  Pain description: dull and achy  Aggravating factors: the more I use it Relieving factors: red light therapy maybe   PRECAUTIONS: Fall  RED  FLAGS: None   WEIGHT BEARING RESTRICTIONS: No  FALLS:  Has patient fallen in last 6 months? No  LIVING ENVIRONMENT: Lives with: lives with their family Lives in: House/apartment Stairs: Yes: External: 3 steps; can reach both Has following equipment at home: Single point cane, Walker - 2 wheeled, and Wheelchair (manual)   PLOF: Independent with basic ADLs and Independent with household mobility with device  PATIENT GOALS: to work on balance   NEXT MD VISIT: 11/08/23  OBJECTIVE:  Note: Objective measures were completed at Evaluation unless otherwise noted.  DIAGNOSTIC FINDINGS: No fracture to ankle   COGNITION: Overall cognitive status: Within functional limits for tasks assessed and some issues with memory     SENSATION: WFL   POSTURE: rounded shoulders, forward head, increased thoracic kyphosis, and flexed trunk   PALPATION: Some tenderness around L ankle, more tender medially   LOWER EXTREMITY ROM: good overall motion for functional tasks, reports some pulling with L ankle PF  LOWER EXTREMITY MMT: 4+/5 BLE, L ankle eversion and inversion are weak 3+/5    LOWER EXTREMITY SPECIAL TESTS:  Ankle special tests: Anterior drawer test: negative  FUNCTIONAL TESTS:  5 times sit to stand: 24s from chair  Timed up and go (TUG): 27s with rolling walker  SLS 1-2s at best   GAIT: Distance walked: in clinic distances  Assistive device utilized: Environmental consultant - 2 wheeled Level of assistance: Modified independence Comments: flexed trunk, decreased knee extension, R foot in ER,                                                                                                                                 TREATMENT DATE:  09/15/23:  Arrived 11 min late. Nustep level 5 x 6 min Reviewed and practiced home exercises, including red t band IN, EV, plantarflexion, all 15 reps.  Needed much tactile and verbal cuing to isolate and engage L ankle musculature Standing heel raises attempted  within walker but pt very painful Sit to stand from chair with airex in seat, with green theraband resistance around B knees to engage lat hip and ankle stabilizers  Kinesiotaping applied L ankle, 2 pieces extending from metatarsals to post ankle and gastro heads    09/01/23- EVAL    PATIENT EDUCATION:  Education details: POC and HEP Person educated: Patient Education method: Medical illustrator Education comprehension: verbalized understanding, returned demonstration, and needs further education  HOME EXERCISE PROGRAM: Access Code: [email or web address] URL: [email or web address] Date: 09/01/2023 Prepared by: Donavon Fudge  Exercises - Heel Raises  with Counter Support  - 1 x daily - 7 x weekly - 2 sets - 10 reps - Ankle Inversion with Resistance  - 1 x daily - 7 x weekly - 2 sets - 10 reps - Ankle Eversion with Resistance  - 1 x daily - 7 x weekly - 2 sets - 10 reps - Ankle Dorsiflexion with Resistance  - 1 x daily - 7 x weekly - 2 sets - 10 reps - Ankle and Toe Plantarflexion with Resistance  - 1 x daily - 7 x weekly - 2 sets - 10 reps  ASSESSMENT:  CLINICAL IMPRESSION: Patient is a 83 y.o. female who participated today with  physical therapy treatment for L ankle pain. She thinks she may have twisted it but does not recall how or when.     She could not recall specifically her home program from initial visit so reviewed and adapted.    Also recommended to her possibly obtaining ankle sleeve if the kinesiotaping is helpful. Patient would like to work on her gait, balance, and strength to be able to get around with more confidence and get rid of her pain. She should continue to benefit from skilled PT to address her pain and stability L ankle  OBJECTIVE IMPAIRMENTS: Abnormal gait, cardiopulmonary status limiting activity, decreased activity tolerance, decreased balance, decreased coordination, decreased endurance, decreased mobility, difficulty walking, decreased ROM,  decreased strength, increased edema, increased muscle spasms, impaired flexibility, improper body mechanics, and pain.   REHAB POTENTIAL: Good  CLINICAL DECISION MAKING: Evolving/moderate complexity  EVALUATION COMPLEXITY: Moderate   GOALS: Goals reviewed with patient? Yes  SHORT TERM GOALS: Target date: 10/06/23  Patient will be independent with initial HEP. Baseline:  Goal status: INITIAL   2.  Patient will demonstrate improved functional LE strength as demonstrated by 5xSTS <15s. Baseline: 24s Goal status: INITIAL   LONG TERM GOALS: Target date: 11/10/23  Patient will be independent with advanced/ongoing HEP to improve outcomes and carryover.  Baseline:  Goal status: INITIAL  2.  Patient will report <3/10 in L ankle/foot pain to improve QOL. Baseline: 6/10 Goal status: INITIAL  3.  Patient will demonstrate 4+/5 or better for L ankle eversion and inversion  Baseline: 3+ Goal status: INITIAL  4.  Patient will be able to ambulate 300' with LRAD and normal gait pattern without increased foot/ankle pain to access community.  Baseline: using RW, flexed knees, R foot ER Goal status: INITIAL  5. Patient will demonstrate TUG <15s Baseline: 27s using RW  Goal status: INITIAL  6.  Patient will demonstrate SLS 5s on BLE Baseline: 1-2s at best  Goal status: INITIAL  PLAN:  PT FREQUENCY: 2x/week  PT DURATION: 10 weeks  PLANNED INTERVENTIONS: 97110-Therapeutic exercises, 97530- Therapeutic activity, 97112- Neuromuscular re-education, 97535- Self Care, 91478- Manual therapy, [phone]- Gait training, Patient/Family education, Balance training, Stair training, Taping, Dry Needling, Joint mobilization, Spinal mobilization, Cryotherapy, and Moist heat  PLAN FOR NEXT SESSION: L ankle strengthening, functional strengthening, working on balance    Rahmel Nedved L Duwan Adrian, PT, DPT, OCS 09/29/2023, 1:41 PM

## 2023-09-29 NOTE — Progress Notes (Signed)
 Carelink Summary Report / Loop Recorder

## 2023-09-29 NOTE — Therapy (Signed)
 [facility] OUTPATIENT PHYSICAL THERAPY LOWER EXTREMITY  TREATMENT   Patient Name: Cynthia [name] MRN: [id number] DOB:1941/02/11, 83 y.o., female Today's Date: 09/29/2023  END OF SESSION:  PT End of Session - 09/29/23 1559     Visit Number 3    Date for PT Re-Evaluation 11/10/23    PT Start Time 1600    PT Stop Time 1645    PT Time Calculation (min) 45 min    Activity Tolerance Patient tolerated treatment well    Behavior During Therapy [facility] for tasks assessed/performed          Past Medical History:  Diagnosis Date   Arthritis    joints (11/10/2017)   Dysrhythmia    afib   GERD (gastroesophageal reflux disease)    GI bleed 11/06/2017   Hearing loss    History of blood transfusion 10/2017   Hyperlipidemia    Hypertension    Hyperthyroidism    Loop recorder Biotronik Loop Recorder 05/23/2020 05/23/2020   Melanoma (HCC)    cut off my back   Memory loss    Pneumonia    Sleep apnea    cpap   Past Surgical History:  Procedure Laterality Date   ABDOMINAL HYSTERECTOMY     partial   APPENDECTOMY     BIOPSY  11/17/2017   Procedure: BIOPSY;  Surgeon: Ozell Blunt, MD;  Location: [facility];  Service: Endoscopy;;   COLONOSCOPY WITH PROPOFOL  N/A 11/17/2017   Procedure: COLONOSCOPY WITH PROPOFOL ;  Surgeon: Ozell Blunt, MD;  Location: [facility];  Service: Endoscopy;  Laterality: N/A;   CRANIECTOMY FOR EXCISION OF ACOUSTIC NEUROMA     ESOPHAGOGASTRODUODENOSCOPY (EGD) WITH PROPOFOL  N/A 10/28/2017   Procedure: ESOPHAGOGASTRODUODENOSCOPY (EGD) WITH PROPOFOL ;  Surgeon: Ozell Blunt, MD;  Location: [facility];  Service: Endoscopy;  Laterality: N/A;   FOOT SURGERY Bilateral 04/18/2020   KNEE ARTHROPLASTY Right 06/05/2021   Procedure: COMPUTER ASSISTED TOTAL KNEE ARTHROPLASTY;  Surgeon: Adonica Hoose, MD;  Location: [facility];  Service: Orthopedics;  Laterality: Right;   KNEE ARTHROSCOPY Left    LEFT HEART CATH AND CORONARY ANGIOGRAPHY N/A 11/24/2016   Procedure: LEFT HEART CATH AND  CORONARY ANGIOGRAPHY;  Surgeon: Lucendia Rusk, MD;  Location: [facility] INVASIVE CV LAB;  Service: Cardiovascular;  Laterality: N/A;   MELANOMA EXCISION     off my back   NASAL SINUS SURGERY     TONSILLECTOMY     Patient Active Problem List   Diagnosis Date Noted   Syncope 04/07/2023   Precordial pain 07/19/2022   Coronary artery calcification seen on computed tomography 07/19/2022   Aneurysm of ascending aorta without rupture (HCC) 07/19/2022   Degenerative arthritis of right knee 06/05/2021   Osteoarthritis of right knee 06/05/2021   NPH (normal pressure hydrocephalus) (HCC) 08/19/2020   Urinary urgency 08/19/2020   Encounter for loop recorder check 06/09/2020   Loop recorder Biotronik Loop Recorder 05/23/2020 05/23/2020   Paroxysmal atrial fibrillation (HCC) 03/24/2020   Gait disturbance 01/29/2020   Periodic limb movement disorder 08/30/2019   Nocturia 07/01/2018   Midline cystocele 06/08/2018   Recurrent UTI 06/08/2018   Vaginal vault prolapse 06/08/2018   Syncope and collapse    Postural dizziness with presyncope 03/17/2018   PAF (paroxysmal atrial fibrillation) (HCC) 03/17/2018   Memory loss 11/25/2017   Abnormal stress test    Hyperlipidemia 11/23/2016   GERD (gastroesophageal reflux disease) 11/23/2016   Polyneuropathy 10/30/2016   Bursitis, subacromial 10/30/2016   Numbness 10/30/2016   Arthritis, senescent 01/03/2016   Pain in joint,  ankle and foot 02/28/2015   Dry mouth 08/07/2014   Dry eyes 08/07/2014   Other headache syndrome 06/25/2014   Sinusitis, chronic 06/25/2014   Neck pain 06/25/2014   Obstructive sleep apnea 06/25/2014   Essential hypertension, benign 06/25/2014    PCP: Schuyler Custard, MD  REFERRING PROVIDER: Alessandra Ancona, MD  REFERRING DIAG: left ankle pain  THERAPY DIAG:  Pain in left ankle and joints of left foot  Muscle weakness (generalized)  Abnormal posture  Unsteadiness on feet  Repeated falls  Rationale for Evaluation and Treatment:  Rehabilitation  ONSET DATE: 08/06/23  SUBJECTIVE:   SUBJECTIVE STATEMENT: Several places that's are problematic. Ankle is very sore   PERTINENT HISTORY: Right TKA  PAIN:  Are you having pain? Yes: NPRS scale: 6/10 Pain location: L ankle, R low back  Pain description: dull and achy  Aggravating factors: the more I use it Relieving factors: red light therapy maybe   PRECAUTIONS: Fall  RED FLAGS: None   WEIGHT BEARING RESTRICTIONS: No  FALLS:  Has patient fallen in last 6 months? No  LIVING ENVIRONMENT: Lives with: lives with their family Lives in: House/apartment Stairs: Yes: External: 3 steps; can reach both Has following equipment at home: Single point cane, Walker - 2 wheeled, and Wheelchair (manual)   PLOF: Independent with basic ADLs and Independent with household mobility with device  PATIENT GOALS: to work on balance   NEXT MD VISIT: 11/08/23  OBJECTIVE:  Note: Objective measures were completed at Evaluation unless otherwise noted.  DIAGNOSTIC FINDINGS: No fracture to ankle   COGNITION: Overall cognitive status: Within functional limits for tasks assessed and some issues with memory     SENSATION: WFL   POSTURE: rounded shoulders, forward head, increased thoracic kyphosis, and flexed trunk   PALPATION: Some tenderness around L ankle, more tender medially   LOWER EXTREMITY ROM: good overall motion for functional tasks, reports some pulling with L ankle PF  LOWER EXTREMITY MMT: 4+/5 BLE, L ankle eversion and inversion are weak 3+/5    LOWER EXTREMITY SPECIAL TESTS:  Ankle special tests: Anterior drawer test: negative  FUNCTIONAL TESTS:  5 times sit to stand: 24s from chair  Timed up and go (TUG): 27s with rolling walker  SLS 1-2s at best   GAIT: Distance walked: in clinic distances  Assistive device utilized: Environmental consultant - 2 wheeled Level of assistance: Modified independence Comments: flexed trunk, decreased knee extension, R foot in ER,                                                                                                                                  TREATMENT DATE:  09/29/23 NuStep L 5 x 7 min L ankle PROM w/ end range holds in all directions L ankle Green tbad 4 way x10, x10  IV/EV very weak Sit to stand from elevated surface 2x10  Cues not to push LE against table Standing march in RW   09/15/23:  Arrived 11 min late. Nustep level 5 x 6 min Reviewed and practiced home exercises, including red t band IN, EV, plantarflexion, all 15 reps.  Needed much tactile and verbal cuing to isolate and engage L ankle musculature Standing heel raises attempted within walker but pt very painful Sit to stand from chair with airex in seat, with green theraband resistance around B knees to engage lat hip and ankle stabilizers  Kinesiotaping applied L ankle, 2 pieces extending from metatarsals to post ankle and gastro heads    09/01/23- EVAL    PATIENT EDUCATION:  Education details: POC and HEP Person educated: Patient Education method: Medical illustrator Education comprehension: verbalized understanding, returned demonstration, and needs further education  HOME EXERCISE PROGRAM: Access Code: [email or web address] URL: [email or web address] Date: 09/01/2023 Prepared by: Donavon Fudge  Exercises - Heel Raises with Counter Support  - 1 x daily - 7 x weekly - 2 sets - 10 reps - Ankle Inversion with Resistance  - 1 x daily - 7 x weekly - 2 sets - 10 reps - Ankle Eversion with Resistance  - 1 x daily - 7 x weekly - 2 sets - 10 reps - Ankle Dorsiflexion with Resistance  - 1 x daily - 7 x weekly - 2 sets - 10 reps - Ankle and Toe Plantarflexion with Resistance  - 1 x daily - 7 x weekly - 2 sets - 10 reps  ASSESSMENT:  CLINICAL IMPRESSION: Patient is a 83 y.o. female who participated today with  physical therapy treatment for L ankle pain. She thinks she may have twisted it but does not recall how or when. Session  focused on L ankle strength and functional mobility. Cues needed during session not to hold walker to far out front. L ankle is very weak with inversion and inversion. Good weight distribution with sit to stands.No repots of pain during session despite the anticipation of pain with PROM. She should continue to benefit from skilled PT to address her pain and stability L ankle  OBJECTIVE IMPAIRMENTS: Abnormal gait, cardiopulmonary status limiting activity, decreased activity tolerance, decreased balance, decreased coordination, decreased endurance, decreased mobility, difficulty walking, decreased ROM, decreased strength, increased edema, increased muscle spasms, impaired flexibility, improper body mechanics, and pain.   REHAB POTENTIAL: Good  CLINICAL DECISION MAKING: Evolving/moderate complexity  EVALUATION COMPLEXITY: Moderate   GOALS: Goals reviewed with patient? Yes  SHORT TERM GOALS: Target date: 10/06/23  Patient will be independent with initial HEP. Baseline:  Goal status: INITIAL   2.  Patient will demonstrate improved functional LE strength as demonstrated by 5xSTS <15s. Baseline: 24s Goal status: INITIAL   LONG TERM GOALS: Target date: 11/10/23  Patient will be independent with advanced/ongoing HEP to improve outcomes and carryover.  Baseline:  Goal status: INITIAL  2.  Patient will report <3/10 in L ankle/foot pain to improve QOL. Baseline: 6/10 Goal status: INITIAL  3.  Patient will demonstrate 4+/5 or better for L ankle eversion and inversion  Baseline: 3+ Goal status: INITIAL  4.  Patient will be able to ambulate 300' with LRAD and normal gait pattern without increased foot/ankle pain to access community.  Baseline: using RW, flexed knees, R foot ER Goal status: INITIAL  5. Patient will demonstrate TUG <15s Baseline: 27s using RW  Goal status: INITIAL  6.  Patient will demonstrate SLS 5s on BLE Baseline: 1-2s at best  Goal status: INITIAL  PLAN:  PT  FREQUENCY: 2x/week  PT DURATION: 10 weeks  PLANNED INTERVENTIONS: 97110-Therapeutic exercises, 97530-  Therapeutic activity, [id number]- Neuromuscular re-education, [phone]- Self Care, [id number]- Manual therapy, [phone]- Gait training, Patient/Family education, Balance training, Stair training, Taping, Dry Needling, Joint mobilization, Spinal mobilization, Cryotherapy, and Moist heat  PLAN FOR NEXT SESSION: L ankle strengthening, functional strengthening, working on balance    Ollen Beverage, PTA, DPT, OCS 09/29/2023, 4:00 PM

## 2023-10-01 ENCOUNTER — Ambulatory Visit: Admitting: Physical Therapy

## 2023-10-03 ENCOUNTER — Ambulatory Visit: Payer: Self-pay | Admitting: Cardiology

## 2023-10-04 DIAGNOSIS — N3020 Other chronic cystitis without hematuria: Secondary | ICD-10-CM | POA: Diagnosis not present

## 2023-10-05 DIAGNOSIS — Z96651 Presence of right artificial knee joint: Secondary | ICD-10-CM | POA: Diagnosis not present

## 2023-10-05 DIAGNOSIS — M1990 Unspecified osteoarthritis, unspecified site: Secondary | ICD-10-CM | POA: Diagnosis not present

## 2023-10-05 DIAGNOSIS — R5381 Other malaise: Secondary | ICD-10-CM | POA: Diagnosis not present

## 2023-10-05 DIAGNOSIS — R2689 Other abnormalities of gait and mobility: Secondary | ICD-10-CM | POA: Diagnosis not present

## 2023-10-05 DIAGNOSIS — R296 Repeated falls: Secondary | ICD-10-CM | POA: Diagnosis not present

## 2023-10-05 DIAGNOSIS — G3184 Mild cognitive impairment, so stated: Secondary | ICD-10-CM | POA: Diagnosis not present

## 2023-10-05 DIAGNOSIS — N390 Urinary tract infection, site not specified: Secondary | ICD-10-CM | POA: Diagnosis not present

## 2023-10-05 DIAGNOSIS — R2681 Unsteadiness on feet: Secondary | ICD-10-CM | POA: Diagnosis not present

## 2023-10-09 DIAGNOSIS — I10 Essential (primary) hypertension: Secondary | ICD-10-CM | POA: Diagnosis not present

## 2023-10-09 DIAGNOSIS — R5381 Other malaise: Secondary | ICD-10-CM | POA: Diagnosis not present

## 2023-10-09 DIAGNOSIS — M150 Primary generalized (osteo)arthritis: Secondary | ICD-10-CM | POA: Diagnosis not present

## 2023-10-09 DIAGNOSIS — M810 Age-related osteoporosis without current pathological fracture: Secondary | ICD-10-CM | POA: Diagnosis not present

## 2023-10-09 DIAGNOSIS — G3184 Mild cognitive impairment, so stated: Secondary | ICD-10-CM | POA: Diagnosis not present

## 2023-10-09 DIAGNOSIS — I480 Paroxysmal atrial fibrillation: Secondary | ICD-10-CM | POA: Diagnosis not present

## 2023-10-09 DIAGNOSIS — D508 Other iron deficiency anemias: Secondary | ICD-10-CM | POA: Diagnosis not present

## 2023-10-09 DIAGNOSIS — N390 Urinary tract infection, site not specified: Secondary | ICD-10-CM | POA: Diagnosis not present

## 2023-10-09 DIAGNOSIS — R2689 Other abnormalities of gait and mobility: Secondary | ICD-10-CM | POA: Diagnosis not present

## 2023-10-13 ENCOUNTER — Other Ambulatory Visit: Payer: Self-pay

## 2023-10-13 ENCOUNTER — Ambulatory Visit: Attending: Physical Medicine and Rehabilitation

## 2023-10-13 DIAGNOSIS — M25572 Pain in left ankle and joints of left foot: Secondary | ICD-10-CM | POA: Insufficient documentation

## 2023-10-13 DIAGNOSIS — R296 Repeated falls: Secondary | ICD-10-CM | POA: Insufficient documentation

## 2023-10-13 DIAGNOSIS — R2681 Unsteadiness on feet: Secondary | ICD-10-CM | POA: Insufficient documentation

## 2023-10-13 DIAGNOSIS — I480 Paroxysmal atrial fibrillation: Secondary | ICD-10-CM | POA: Diagnosis not present

## 2023-10-13 DIAGNOSIS — M810 Age-related osteoporosis without current pathological fracture: Secondary | ICD-10-CM | POA: Diagnosis not present

## 2023-10-13 DIAGNOSIS — M6281 Muscle weakness (generalized): Secondary | ICD-10-CM | POA: Diagnosis not present

## 2023-10-13 DIAGNOSIS — N390 Urinary tract infection, site not specified: Secondary | ICD-10-CM | POA: Diagnosis not present

## 2023-10-13 DIAGNOSIS — R5381 Other malaise: Secondary | ICD-10-CM | POA: Diagnosis not present

## 2023-10-13 DIAGNOSIS — R293 Abnormal posture: Secondary | ICD-10-CM | POA: Insufficient documentation

## 2023-10-13 DIAGNOSIS — R2689 Other abnormalities of gait and mobility: Secondary | ICD-10-CM | POA: Diagnosis not present

## 2023-10-13 DIAGNOSIS — D508 Other iron deficiency anemias: Secondary | ICD-10-CM | POA: Diagnosis not present

## 2023-10-13 DIAGNOSIS — I10 Essential (primary) hypertension: Secondary | ICD-10-CM | POA: Diagnosis not present

## 2023-10-13 DIAGNOSIS — G3184 Mild cognitive impairment, so stated: Secondary | ICD-10-CM | POA: Diagnosis not present

## 2023-10-13 DIAGNOSIS — M150 Primary generalized (osteo)arthritis: Secondary | ICD-10-CM | POA: Diagnosis not present

## 2023-10-13 NOTE — Therapy (Signed)
 [facility] LOWER EXTREMITY  TREATMENT   Patient Name: Cynthia [name] MRN: [id number] DOB:May 05, 1940, 83 y.o., female Today's Date: 10/13/2023  END OF SESSION:  PT End of Session - 10/13/23 1408     Visit Number 4    Date for PT Re-Evaluation 11/10/23    Authorization Type [employer]    Progress Note Due on Visit 10    PT Start Time 1353    PT Stop Time 1431    PT Time Calculation (min) 38 min    Activity Tolerance Patient tolerated treatment well    Behavior During Therapy [facility] for tasks assessed/performed           Past Medical History:  Diagnosis Date   Arthritis    joints (11/10/2017)   Dysrhythmia    afib   GERD (gastroesophageal reflux disease)    GI bleed 11/06/2017   Hearing loss    History of blood transfusion 10/2017   Hyperlipidemia    Hypertension    Hyperthyroidism    Loop recorder Biotronik Loop Recorder 05/23/2020 05/23/2020   Melanoma (HCC)    cut off my back   Memory loss    Pneumonia    Sleep apnea    cpap   Past Surgical History:  Procedure Laterality Date   ABDOMINAL HYSTERECTOMY     partial   APPENDECTOMY     BIOPSY  11/17/2017   Procedure: BIOPSY;  Surgeon: Rosalie Kitchens, MD;  Location: [facility];  Service: Endoscopy;;   COLONOSCOPY WITH PROPOFOL  N/A 11/17/2017   Procedure: COLONOSCOPY WITH PROPOFOL ;  Surgeon: Rosalie Kitchens, MD;  Location: [facility];  Service: Endoscopy;  Laterality: N/A;   CRANIECTOMY FOR EXCISION OF ACOUSTIC NEUROMA     ESOPHAGOGASTRODUODENOSCOPY (EGD) WITH PROPOFOL  N/A 10/28/2017   Procedure: ESOPHAGOGASTRODUODENOSCOPY (EGD) WITH PROPOFOL ;  Surgeon: Rosalie Kitchens, MD;  Location: [facility];  Service: Endoscopy;  Laterality: N/A;   FOOT SURGERY Bilateral 04/18/2020   KNEE ARTHROPLASTY Right 06/05/2021   Procedure: COMPUTER ASSISTED TOTAL KNEE ARTHROPLASTY;  Surgeon: Fidel Rogue, MD;  Location: [facility];  Service: Orthopedics;  Laterality: Right;   KNEE ARTHROSCOPY Left    LEFT HEART CATH AND CORONARY  ANGIOGRAPHY N/A 11/24/2016   Procedure: LEFT HEART CATH AND CORONARY ANGIOGRAPHY;  Surgeon: Dann Candyce RAMAN, MD;  Location: [facility] INVASIVE CV LAB;  Service: Cardiovascular;  Laterality: N/A;   MELANOMA EXCISION     off my back   NASAL SINUS SURGERY     TONSILLECTOMY     Patient Active Problem List   Diagnosis Date Noted   Syncope 04/07/2023   Precordial pain 07/19/2022   Coronary artery calcification seen on computed tomography 07/19/2022   Aneurysm of ascending aorta without rupture (HCC) 07/19/2022   Degenerative arthritis of right knee 06/05/2021   Osteoarthritis of right knee 06/05/2021   NPH (normal pressure hydrocephalus) (HCC) 08/19/2020   Urinary urgency 08/19/2020   Encounter for loop recorder check 06/09/2020   Loop recorder Biotronik Loop Recorder 05/23/2020 05/23/2020   Paroxysmal atrial fibrillation (HCC) 03/24/2020   Gait disturbance 01/29/2020   Periodic limb movement disorder 08/30/2019   Nocturia 07/01/2018   Midline cystocele 06/08/2018   Recurrent UTI 06/08/2018   Vaginal vault prolapse 06/08/2018   Syncope and collapse    Postural dizziness with presyncope 03/17/2018   PAF (paroxysmal atrial fibrillation) (HCC) 03/17/2018   Memory loss 11/25/2017   Abnormal stress test    Hyperlipidemia 11/23/2016   GERD (gastroesophageal reflux disease) 11/23/2016   Polyneuropathy 10/30/2016   Bursitis,  subacromial 10/30/2016   Numbness 10/30/2016   Arthritis, senescent 01/03/2016   Pain in joint, ankle and foot 02/28/2015   Dry mouth 08/07/2014   Dry eyes 08/07/2014   Other headache syndrome 06/25/2014   Sinusitis, chronic 06/25/2014   Neck pain 06/25/2014   Obstructive sleep apnea 06/25/2014   Essential hypertension, benign 06/25/2014    PCP: Clarice, MD  REFERRING PROVIDER: Lorilee, MD  REFERRING DIAG: left ankle pain  THERAPY DIAG:  Pain in left ankle and joints of left foot  Muscle weakness (generalized)  Abnormal posture  Unsteadiness on  feet  Repeated falls  Rationale for Evaluation and Treatment: Rehabilitation  ONSET DATE: 08/06/23  SUBJECTIVE:   SUBJECTIVE STATEMENT: L  ankle some better.  My walker is wobbly, I think I need a new one  PERTINENT HISTORY: Right TKA  PAIN:  Are you having pain? Yes: NPRS scale: 6/10 Pain location: L ankle, R low back  Pain description: dull and achy  Aggravating factors: the more I use it Relieving factors: red light therapy maybe   PRECAUTIONS: Fall  RED FLAGS: None   WEIGHT BEARING RESTRICTIONS: No  FALLS:  Has patient fallen in last 6 months? No  LIVING ENVIRONMENT: Lives with: lives with their family Lives in: House/apartment Stairs: Yes: External: 3 steps; can reach both Has following equipment at home: Single point cane, Walker - 2 wheeled, and Wheelchair (manual)   PLOF: Independent with basic ADLs and Independent with household mobility with device  PATIENT GOALS: to work on balance   NEXT MD VISIT: 11/08/23  OBJECTIVE:  Note: Objective measures were completed at Evaluation unless otherwise noted.  DIAGNOSTIC FINDINGS: No fracture to ankle   COGNITION: Overall cognitive status: Within functional limits for tasks assessed and some issues with memory     SENSATION: WFL   POSTURE: rounded shoulders, forward head, increased thoracic kyphosis, and flexed trunk   PALPATION: Some tenderness around L ankle, more tender medially   LOWER EXTREMITY ROM: good overall motion for functional tasks, reports some pulling with L ankle PF  LOWER EXTREMITY MMT: 4+/5 BLE, L ankle eversion and inversion are weak 3+/5    LOWER EXTREMITY SPECIAL TESTS:  Ankle special tests: Anterior drawer test: negative  FUNCTIONAL TESTS:  5 times sit to stand: 24s from chair  Timed up and go (TUG): 27s with rolling walker  SLS 1-2s at best   GAIT: Distance walked: in clinic distances  Assistive device utilized: Environmental consultant - 2 wheeled Level of assistance: Modified  independence Comments: flexed trunk, decreased knee extension, R foot in ER,                                                                                                                                 TREATMENT DATE:  10/12/23:  Seated inv no resistance 20 Seated eversion green t band 20 Seated ankle pumps green t band 20 Sit to stand from elevated surface 2x10 Walked 200',  x 2 min 56 sec. C/o L ankle pain at the 170' mark Cross friction massage L medial ankle along l tibialis posterior tendon AAROM , stretching L ankle all directions, more so for INV /ER Nustep L 5 x 6 min   09/29/23 NuStep L 5 x 7 min L ankle PROM w/ end range holds in all directions L ankle Green tbad 4 way x10, x10  IV/EV very weak Sit to stand from elevated surface 2x10  Cues not to push LE against table Standing march in RW   09/15/23:  Arrived 11 min late. Nustep level 5 x 6 min Reviewed and practiced home exercises, including red t band IN, EV, plantarflexion, all 15 reps.  Needed much tactile and verbal cuing to isolate and engage L ankle musculature Standing heel raises attempted within walker but pt very painful Sit to stand from chair with airex in seat, with green theraband resistance around B knees to engage lat hip and ankle stabilizers  Kinesiotaping applied L ankle, 2 pieces extending from metatarsals to post ankle and gastro heads    09/01/23- EVAL    PATIENT EDUCATION:  Education details: POC and HEP Person educated: Patient Education method: Medical illustrator Education comprehension: verbalized understanding, returned demonstration, and needs further education  HOME EXERCISE PROGRAM: Access Code: [email or web address] URL: [email or web address] Date: 09/01/2023 Prepared by: Almetta Fam  Exercises - Heel Raises with Counter Support  - 1 x daily - 7 x weekly - 2 sets - 10 reps - Ankle Inversion with Resistance  - 1 x daily - 7 x weekly - 2 sets - 10 reps - Ankle  Eversion with Resistance  - 1 x daily - 7 x weekly - 2 sets - 10 reps - Ankle Dorsiflexion with Resistance  - 1 x daily - 7 x weekly - 2 sets - 10 reps - Ankle and Toe Plantarflexion with Resistance  - 1 x daily - 7 x weekly - 2 sets - 10 reps  ASSESSMENT:  CLINICAL IMPRESSION: Patient is a 83 y.o. female who participated today with  physical therapy treatment for L ankle pain. Pt arrived several mins late.  She is unable to recall previous sessions or HEP when asked.   Session focused on L ankle strength and functional mobility.  Needs new walker, hers has twisted wheel, difficult to push. She should continue to benefit from skilled PT to address her pain and stability L ankle.  OBJECTIVE IMPAIRMENTS: Abnormal gait, cardiopulmonary status limiting activity, decreased activity tolerance, decreased balance, decreased coordination, decreased endurance, decreased mobility, difficulty walking, decreased ROM, decreased strength, increased edema, increased muscle spasms, impaired flexibility, improper body mechanics, and pain.   REHAB POTENTIAL: Good  CLINICAL DECISION MAKING: Evolving/moderate complexity  EVALUATION COMPLEXITY: Moderate   GOALS: Goals reviewed with patient? Yes  SHORT TERM GOALS: Target date: 10/06/23  Patient will be independent with initial HEP. Baseline:  Goal status: INITIAL   2.  Patient will demonstrate improved functional LE strength as demonstrated by 5xSTS <15s. Baseline: 24s Goal status: INITIAL   LONG TERM GOALS: Target date: 11/10/23  Patient will be independent with advanced/ongoing HEP to improve outcomes and carryover.  Baseline:  Goal status: INITIAL  2.  Patient will report <3/10 in L ankle/foot pain to improve QOL. Baseline: 6/10 Goal status: 10/13/23 today 5/10  3.  Patient will demonstrate 4+/5 or better for L ankle eversion and inversion  Baseline: 3+ Goal status: INITIAL  4.  Patient will be able to ambulate 300' with LRAD  and normal gait  pattern without increased foot/ankle pain to access community.  Baseline: using RW, flexed knees, R foot ER Goal status: progressing, walked 200' today with front wheeled walker, very labored gait pattern.  5. Patient will demonstrate TUG <15s Baseline: 27s using RW  Goal status: INITIAL  6.  Patient will demonstrate SLS 5s on BLE Baseline: 1-2s at best  Goal status: INITIAL  PLAN:  PT FREQUENCY: 2x/week  PT DURATION: 10 weeks  PLANNED INTERVENTIONS: 97110-Therapeutic exercises, 97530- Therapeutic activity, 97112- Neuromuscular re-education, 97535- Self Care, [id number]- Manual therapy, [phone]- Gait training, Patient/Family education, Balance training, Stair training, Taping, Dry Needling, Joint mobilization, Spinal mobilization, Cryotherapy, and Moist heat  PLAN FOR NEXT SESSION: L ankle strengthening, functional strengthening, working on balance    Alda Gaultney L Adwoa Axe, PT, DPT, OCS 10/13/2023, 5:21 PM

## 2023-10-15 DIAGNOSIS — D508 Other iron deficiency anemias: Secondary | ICD-10-CM | POA: Diagnosis not present

## 2023-10-15 DIAGNOSIS — M810 Age-related osteoporosis without current pathological fracture: Secondary | ICD-10-CM | POA: Diagnosis not present

## 2023-10-15 DIAGNOSIS — I10 Essential (primary) hypertension: Secondary | ICD-10-CM | POA: Diagnosis not present

## 2023-10-15 DIAGNOSIS — G3184 Mild cognitive impairment, so stated: Secondary | ICD-10-CM | POA: Diagnosis not present

## 2023-10-15 DIAGNOSIS — R5381 Other malaise: Secondary | ICD-10-CM | POA: Diagnosis not present

## 2023-10-15 DIAGNOSIS — R2689 Other abnormalities of gait and mobility: Secondary | ICD-10-CM | POA: Diagnosis not present

## 2023-10-15 DIAGNOSIS — N390 Urinary tract infection, site not specified: Secondary | ICD-10-CM | POA: Diagnosis not present

## 2023-10-15 DIAGNOSIS — M150 Primary generalized (osteo)arthritis: Secondary | ICD-10-CM | POA: Diagnosis not present

## 2023-10-15 DIAGNOSIS — I480 Paroxysmal atrial fibrillation: Secondary | ICD-10-CM | POA: Diagnosis not present

## 2023-10-20 DIAGNOSIS — R2689 Other abnormalities of gait and mobility: Secondary | ICD-10-CM | POA: Diagnosis not present

## 2023-10-20 DIAGNOSIS — R5381 Other malaise: Secondary | ICD-10-CM | POA: Diagnosis not present

## 2023-10-20 DIAGNOSIS — G3184 Mild cognitive impairment, so stated: Secondary | ICD-10-CM | POA: Diagnosis not present

## 2023-10-20 DIAGNOSIS — M150 Primary generalized (osteo)arthritis: Secondary | ICD-10-CM | POA: Diagnosis not present

## 2023-10-20 DIAGNOSIS — I10 Essential (primary) hypertension: Secondary | ICD-10-CM | POA: Diagnosis not present

## 2023-10-20 DIAGNOSIS — N390 Urinary tract infection, site not specified: Secondary | ICD-10-CM | POA: Diagnosis not present

## 2023-10-20 DIAGNOSIS — I480 Paroxysmal atrial fibrillation: Secondary | ICD-10-CM | POA: Diagnosis not present

## 2023-10-20 DIAGNOSIS — M810 Age-related osteoporosis without current pathological fracture: Secondary | ICD-10-CM | POA: Diagnosis not present

## 2023-10-20 DIAGNOSIS — D508 Other iron deficiency anemias: Secondary | ICD-10-CM | POA: Diagnosis not present

## 2023-10-22 ENCOUNTER — Ambulatory Visit

## 2023-10-22 DIAGNOSIS — R55 Syncope and collapse: Secondary | ICD-10-CM | POA: Diagnosis not present

## 2023-10-23 LAB — CUP PACEART REMOTE DEVICE CHECK
Date Time Interrogation Session: 20250711092926
Implantable Pulse Generator Implant Date: 20220210
Pulse Gen Model: 436066
Pulse Gen Serial Number: 94061742

## 2023-10-29 DIAGNOSIS — I10 Essential (primary) hypertension: Secondary | ICD-10-CM | POA: Diagnosis not present

## 2023-10-29 DIAGNOSIS — G3184 Mild cognitive impairment, so stated: Secondary | ICD-10-CM | POA: Diagnosis not present

## 2023-10-29 DIAGNOSIS — M810 Age-related osteoporosis without current pathological fracture: Secondary | ICD-10-CM | POA: Diagnosis not present

## 2023-10-29 DIAGNOSIS — I480 Paroxysmal atrial fibrillation: Secondary | ICD-10-CM | POA: Diagnosis not present

## 2023-10-29 DIAGNOSIS — M150 Primary generalized (osteo)arthritis: Secondary | ICD-10-CM | POA: Diagnosis not present

## 2023-10-29 DIAGNOSIS — R5381 Other malaise: Secondary | ICD-10-CM | POA: Diagnosis not present

## 2023-10-29 DIAGNOSIS — D508 Other iron deficiency anemias: Secondary | ICD-10-CM | POA: Diagnosis not present

## 2023-10-29 DIAGNOSIS — N390 Urinary tract infection, site not specified: Secondary | ICD-10-CM | POA: Diagnosis not present

## 2023-10-29 DIAGNOSIS — R2689 Other abnormalities of gait and mobility: Secondary | ICD-10-CM | POA: Diagnosis not present

## 2023-10-31 ENCOUNTER — Ambulatory Visit: Payer: Self-pay | Admitting: Cardiology

## 2023-11-03 ENCOUNTER — Ambulatory Visit: Admitting: Physical Therapy

## 2023-11-03 DIAGNOSIS — D508 Other iron deficiency anemias: Secondary | ICD-10-CM | POA: Diagnosis not present

## 2023-11-03 DIAGNOSIS — R2689 Other abnormalities of gait and mobility: Secondary | ICD-10-CM | POA: Diagnosis not present

## 2023-11-03 DIAGNOSIS — I10 Essential (primary) hypertension: Secondary | ICD-10-CM | POA: Diagnosis not present

## 2023-11-03 DIAGNOSIS — M150 Primary generalized (osteo)arthritis: Secondary | ICD-10-CM | POA: Diagnosis not present

## 2023-11-03 DIAGNOSIS — I480 Paroxysmal atrial fibrillation: Secondary | ICD-10-CM | POA: Diagnosis not present

## 2023-11-03 DIAGNOSIS — R5381 Other malaise: Secondary | ICD-10-CM | POA: Diagnosis not present

## 2023-11-03 DIAGNOSIS — G3184 Mild cognitive impairment, so stated: Secondary | ICD-10-CM | POA: Diagnosis not present

## 2023-11-03 DIAGNOSIS — M810 Age-related osteoporosis without current pathological fracture: Secondary | ICD-10-CM | POA: Diagnosis not present

## 2023-11-03 DIAGNOSIS — N390 Urinary tract infection, site not specified: Secondary | ICD-10-CM | POA: Diagnosis not present

## 2023-11-08 ENCOUNTER — Ambulatory Visit

## 2023-11-08 ENCOUNTER — Encounter

## 2023-11-08 ENCOUNTER — Encounter: Attending: Physical Medicine and Rehabilitation | Admitting: Physical Medicine and Rehabilitation

## 2023-11-08 ENCOUNTER — Encounter: Payer: Self-pay | Admitting: Physical Medicine and Rehabilitation

## 2023-11-08 VITALS — BP 111/74 | HR 71 | Ht 65.0 in | Wt 157.0 lb

## 2023-11-08 DIAGNOSIS — M25572 Pain in left ankle and joints of left foot: Secondary | ICD-10-CM | POA: Diagnosis not present

## 2023-11-08 DIAGNOSIS — G8929 Other chronic pain: Secondary | ICD-10-CM | POA: Insufficient documentation

## 2023-11-08 DIAGNOSIS — M25551 Pain in right hip: Secondary | ICD-10-CM | POA: Diagnosis not present

## 2023-11-08 MED ORDER — MEMANTINE HCL 5 MG PO TABS: 5.0000 mg | ORAL_TABLET | Freq: Two times a day (BID) | ORAL | 3 refills | Status: DC

## 2023-11-08 NOTE — Patient Instructions (Signed)
 [email or web address]

## 2023-11-08 NOTE — Progress Notes (Signed)
 Subjective:    [name], female    DOB: 12/07/1940, 83 y.o.   MRN: [id number]  HPI Cynthia [name] is an 83 year old woman who presents to establish care for right little toe, right hip, and left knee pain.  1) Right hip pain: -has a PT coming to the house -has been present for a long time -when her pain is severe she uses 2 tylenol  and tramadol  -it discourages her from getting up and walking around -she did PT a long time ago -until fairly recently she was in a weekly exercise group  2) Left knee pain: -started bothering her about 3 months ago -she feels that has contributed to declining mobility -uses walker when going to appointments -knee pain is not getting worse  3) Left ankle pain: -turned the ankle a a couple of weeks ago -eversion hurts -got an XR and there was no fracture -she is elevating   4) Low back pain:  -forgets lidocaine  is there  Pain Inventory Average Pain 7 Pain Right Now 7 My pain is dull and aching  In the last 24 hours, has pain interfered with the following? General activity 4 Relation with others 0 Enjoyment of life 5 What TIME of day is your pain at its worst? daytime and evening Sleep (in general) Fair  Pain is worse with: standing Pain improves with: rest, heat/ice, therapy/exercise, and medication Relief from Meds: 8       Family History  Problem Relation Age of Onset   Congestive Heart Failure Mother    Heart disease Father        History of heart attacks at a later age.     Heart disease Brother    Hearing loss Brother    Social History   Socioeconomic History   Marital status: Married    Spouse name: Not on file   Number of children: 2   Years of education: Not on file   Highest education level: Not on file  Occupational History   Not on file  Tobacco Use   Smoking status: Former    Current packs/day: 0.00    Types: Cigarettes    Start date: 23    Quit date: 1980    Years since quitting: 45.6    Smokeless tobacco: Never   Tobacco comments:    11/10/2017 only smoked when I was on my period  Vaping Use   Vaping status: Never Used  Substance and Sexual Activity   Alcohol  use: Yes    Alcohol /week: 1.0 standard drink of alcohol     Types: 1 Glasses of wine per week    Comment: wine on occassionally   Drug use: Never   Sexual activity: Not Currently  Other Topics Concern   Not on file  Social History Narrative   Right handed    Social Drivers of Health   Financial Resource Strain: Not on file  Food Insecurity: Unknown (04/07/2023)   Hunger Vital Sign    Worried About Running Out of Food in the Last Year: Not on file    Ran Out of Food in the Last Year: Never true  Transportation Needs: No Transportation Needs (04/07/2023)   PRAPARE - Administrator, Civil Service (Medical): No    Lack of Transportation (Non-Medical): No  Physical Activity: Not on file  Stress: Not on file  Social Connections: Unknown (08/16/2021)   [facility]   Social Network    Social Network: Not  on file   Past Surgical History:  Procedure Laterality Date   ABDOMINAL HYSTERECTOMY     partial   APPENDECTOMY     BIOPSY  11/17/2017   Procedure: BIOPSY;  Surgeon: Rosalie Kitchens, MD;  Location: [facility];  Service: Endoscopy;;   COLONOSCOPY WITH PROPOFOL  N/A 11/17/2017   Procedure: COLONOSCOPY WITH PROPOFOL ;  Surgeon: Rosalie Kitchens, MD;  Location: [facility];  Service: Endoscopy;  Laterality: N/A;   CRANIECTOMY FOR EXCISION OF ACOUSTIC NEUROMA     ESOPHAGOGASTRODUODENOSCOPY (EGD) WITH PROPOFOL  N/A 10/28/2017   Procedure: ESOPHAGOGASTRODUODENOSCOPY (EGD) WITH PROPOFOL ;  Surgeon: Rosalie Kitchens, MD;  Location: [facility];  Service: Endoscopy;  Laterality: N/A;   FOOT SURGERY Bilateral 04/18/2020   KNEE ARTHROPLASTY Right 06/05/2021   Procedure: COMPUTER ASSISTED TOTAL KNEE ARTHROPLASTY;  Surgeon: Fidel Rogue, MD;  Location: [facility];  Service: Orthopedics;  Laterality: Right;    KNEE ARTHROSCOPY Left    LEFT HEART CATH AND CORONARY ANGIOGRAPHY N/A 11/24/2016   Procedure: LEFT HEART CATH AND CORONARY ANGIOGRAPHY;  Surgeon: Dann Candyce RAMAN, MD;  Location: [facility] INVASIVE CV LAB;  Service: Cardiovascular;  Laterality: N/A;   MELANOMA EXCISION     off my back   NASAL SINUS SURGERY     TONSILLECTOMY     Past Medical History:  Diagnosis Date   Arthritis    joints (11/10/2017)   Dysrhythmia    afib   GERD (gastroesophageal reflux disease)    GI bleed 11/06/2017   Hearing loss    History of blood transfusion 10/2017   Hyperlipidemia    Hypertension    Hyperthyroidism    Loop recorder Biotronik Loop Recorder 05/23/2020 05/23/2020   Melanoma (HCC)    cut off my back   Memory loss    Pneumonia    Sleep apnea    cpap   BP 111/74   Pulse 71   Ht 5' 5 (1.651 m)   Wt 157 lb (71.2 kg)   PF 95 L/min   BMI 26.13 kg/m   Opioid Risk Score:   Fall Risk Score:  `1  Depression screen [facility] 2/9     11/08/2023   11:20 AM 11/08/2023   11:18 AM 11/08/2023   11:12 AM 08/06/2023   11:06 AM 01/03/2016   11:20 AM 01/03/2016   11:19 AM 08/14/2015   11:45 AM  Depression screen [facility] 2/9  Decreased Interest 0 1 0 0 0 0 0  Down, Depressed, Hopeless 0 1 0 0 0 0 0  PHQ - 2 Score 0 2 0 0 0 0 0      Review of Systems  Musculoskeletal:  Positive for back pain and gait problem.  All other systems reviewed and are negative.      Objective:   Physical Exam Gen: no distress, normal appearing HEENT: oral mucosa pink and moist, NCAT Cardio: Reg rate Chest: normal effort, normal rate of breathing Abd: soft, non-distended Ext: no edema Psych: pleasant, normal affect Skin: intact Neuro: Alert and oriented x3, TTP right buttock and over trochanteric bursitis, TTP in left posterior fossa of knee, clicking in left knee MSK: stable       Assessment & Plan:   1) Right hip pain:  -continue red light therapy -continue PT -discussed aquatherapy -prescribed  namenda  -discussed that it seems to be secondary to bursitis -referred to physical therapy for strengthening of glut med and min -continue tylenol  and tramadol  prn -Discussed current symptoms of pain and history of pain.  -Discussed benefits of exercise in  reducing pain. -Discussed following foods that may reduce pain: -Discussed current symptoms of pain and history of pain.  -Discussed benefits of exercise in reducing pain. -Discussed following foods that may reduce pain: 1) Ginger (especially studied for arthritis)- reduce leukotriene production to decrease inflammation 2) Blueberries- high in phytonutrients that decrease inflammation 3) Salmon- marine omega-3s reduce joint swelling and pain 4) Pumpkin seeds- reduce inflammation 5) dark chocolate- reduces inflammation 6) turmeric- reduces inflammation 7) tart cherries - reduce pain and stiffness 8) extra virgin olive oil - its compound olecanthal helps to block prostaglandins  9) chili peppers- can be eaten or applied topically via capsaicin 10) mint- helpful for headache, muscle aches, joint pain, and itching 11) garlic- reduces inflammation 12) Green tea- reduces inflammation and oxidative stress, helps with weight loss, may reduce the risk of cancer, recommend Double [employer] of Tea daily  Link to further information on diet for chronic pain: http://www.bray.com/   2) Right buttock pain/sciatica/neuropathy: -failed lidocaine  patch, PT, Tylenol , Tramadol  -called [employer] to appeal decision of Qutenza denial, discussed that this patch has been studied for this condition in [place] -discussed with patient and husband that they feel unfair that the patch was denied for Ms. Abe while it was approved for Mr. Kistner for the same condition  -discussed benefits of turmeric  -Discussed Qutenza as an option for neuropathic pain control. Discussed that this is a  capsaicin patch, stronger than capsaicin cream. Discussed that it is currently approved for diabetic peripheral neuropathy and post-herpetic neuralgia, but that it has also shown benefit in treating other forms of neuropathy. Provided patient with link to site to learn more about the patch: https://www.clark.biz/. Discussed that the patch would be placed in office and benefits usually last 3 months. Discussed that unintended exposure to capsaicin can cause severe irritation of eyes, mucous membranes, respiratory tract, and skin, but that Qutenza is a local treatment and does not have the systemic side effects of other nerve medications. Discussed that there may be pain, itching, erythema, and decreased sensory function associated with the application of Qutenza. Side effects usually subside within 1 week. A cold pack of analgesic medications can help with these side effects. Blood pressure can also be increased due to pain associated with administration of the patch.   Prescribing Home Zynex NexWave Stimulator Device and supplies as needed. IFC, NMES and TENS medically necessary Treatment Rx: Daily @ 30-40 minutes per treatment PRN. Zynex NexWave only, no substitutions. Treatment Goals: 1) To reduce and/or eliminate pain 2) To improve functional capacity and Activities of daily living 3) To reduce or prevent the need for oral medications 4) To improve circulation in the injured region 5) To decrease or prevent muscle spasm and muscle atrophy 6) To provide a self-management tool to the patient The patient has not sufficiently improved with conservative care. Numerous studies indexed by Medline and PubMed.gov have shown Neuromuscular, Interferential, and TENS stimulators to reduce pain, improve function, and reduce medication use in injured patients. Continued use of this evidence based, safe, drug free treatment is both reasonable and medically necessary at this time.   3) Left knee pain -discussed present  in posterior fossa and can be due to gait impairment stemming from her right hip/buttock pain -recommended daily turmeric with black pepper -discussed ref light therapy -continue voltaren -recommended knee  -recommended coconut oil with frankincense  4) Left ankle pain: -advised wearing compression garment Continue PT -Discussed current symptoms of pain and history of pain.  -Discussed benefits  of exercise in reducing pain. -Discussed following foods that may reduce pain: 1) Ginger (especially studied for arthritis)- reduce leukotriene production to decrease inflammation 2) Blueberries- high in phytonutrients that decrease inflammation 3) Salmon- marine omega-3s reduce joint swelling and pain 4) Pumpkin seeds- reduce inflammation 5) dark chocolate- reduces inflammation 6) turmeric- reduces inflammation 7) tart cherries - reduce pain and stiffness 8) extra virgin olive oil - its compound olecanthal helps to block prostaglandins  9) chili peppers- can be eaten or applied topically via capsaicin 10) mint- helpful for headache, muscle aches, joint pain, and itching 11) garlic- reduces inflammation 12) Green tea- reduces inflammation and oxidative stress, helps with weight loss, may reduce the risk of cancer, recommend Double [employer] of Tea daily  Link to further information on diet for chronic pain: http://www.bray.com/  -discussed that Journavx  is a highly selective inhibitor for Nav 1.8, which is specific for pain in the peripheral nervous system, discussed that lidocaine  in contrast affects all Nav receptors, discussed loading dose is 2 50mg  tablets and this can be taken 1 hour before food, discussed that then 50mg  can be taken with out without food q12H until pain is tolerable. Discussed that potential side effects are pruritus, muscle spasms, increase blood creatinine, rash. Discussed that we have samples  available and we have copay cards available. Discussed that the 2 phase 3 clinicial trials sent to the FDA were for abdominoplasty and bunionectomy, discussed that it has been currently studied for 14 days, discussed that it can reduce efficacy of opioids and benzodiazepines, discussed that it has not be studied in severe hepatic impairment. Discussed that it is contraindicated with fluconazole. Discussed that it can interfere with hormonal contraception up to 28 days after use. Discussed that it can decrease fertility, it has not been studied in pregnancy, that it has been present in animal milk during lactation, not recommended for GFR <15, discussed that outpatient if the medication requires a prior auth the copay should be $30 for at least a 60 day supply. The medication may be more likely to be in stock in [facility] and [facility]. We do have samples available

## 2023-11-10 ENCOUNTER — Telehealth: Payer: Self-pay

## 2023-11-10 ENCOUNTER — Ambulatory Visit: Admitting: Physical Therapy

## 2023-11-10 NOTE — Telephone Encounter (Signed)
 On 11/06/23, patient had sudden rate drop following AF/RVR 3 minutes prior to brief sudden rate drop at time of conversion back to a slower AF/ventricular rhythm (r-r interval from 386 to ).   Patient states she was not symptomatic with brief event. We will continue to monitor.   On another note she has received an OTC supplement called Morning Kick and asks me if she should take it.  It is marketed as a caffeine alternative energy booster.  After looking at the supplement, I advised her not to take this as it does have a disclaimer that states patient's with heart arrhythmias should use with caution.   I explained to patient this is a natural stimulant and stimulants in general could worsen her palpitations and AF/RVR arrhythmias.  Patient would like for me to confirm with Dr. Kennyth.  Forwarding to him for his advice.

## 2023-11-12 ENCOUNTER — Telehealth: Payer: Self-pay | Admitting: Cardiology

## 2023-11-12 NOTE — Telephone Encounter (Signed)
   Pre-operative Risk Assessment    Patient Name: Cynthia [name]  DOB: 1940/11/02 MRN: [id number]   Date of last office visit: 05/21/23 Date of next office visit: 11/26/23   Request for Surgical Clearance    Procedure:  Intervesicular botox  Date of Surgery:  Clearance 11/16/23                                Surgeon:  Dr. Glendia Elizabeth Surgeon's Group or Practice Name:  [facility] Phone number:  [phone]  Fax number:  [phone]   Type of Clearance Requested:   - Pharmacy:  Hold Apixaban  (Eliquis )     Type of Anesthesia:  Local    Additional requests/questions:  Caller Jesusa) stated patient will need to be off Eliquis  for 2 days prior to procedure.  Signed, Jasmin B Wilson   11/12/2023, 2:05 PM

## 2023-11-15 NOTE — Telephone Encounter (Signed)
 Contacted patient and made her aware of Dr. Shaune response.

## 2023-11-16 DIAGNOSIS — N3946 Mixed incontinence: Secondary | ICD-10-CM | POA: Diagnosis not present

## 2023-11-16 NOTE — Telephone Encounter (Signed)
 Patient with diagnosis of afib on Eliquis  for anticoagulation.    Procedure: Intervesicular botox  Date of procedure: 11/16/23??   CHA2DS2-VASc Score = 5   This indicates a 7.2% annual risk of stroke. The patient's score is based upon: CHF History: 0 HTN History: 1 Diabetes History: 0 Stroke History: 0 Vascular Disease History: 1 Age Score: 2 Gender Score: 1      CrCl 61.9 ml/min Platelet count 175  Patient has not had an Afib/aflutter ablation within the last 3 months or DCCV within the last 30 days  Per office protocol, patient can hold Eliquis  for 2 days prior to procedure.    **This guidance is not considered finalized until pre-operative APP has relayed final recommendations.**

## 2023-11-17 NOTE — Telephone Encounter (Signed)
   Patient Name: Cynthia [name]  DOB: 07/25/1940 MRN: [id number]  Primary Cardiologist: Gordy Bergamo, MD  Clinical pharmacists have reviewed the patient's past medical history, labs, and current medications as part of preoperative protocol coverage. The following recommendations have been made:  CHA2DS2-VASc Score = 5   This indicates a 7.2% annual risk of stroke. The patient's score is based upon: CHF History: 0 HTN History: 1 Diabetes History: 0 Stroke History: 0 Vascular Disease History: 1 Age Score: 2 Gender Score: 1   CrCl 61.9 ml/min Platelet count 175   Patient has not had an Afib/aflutter ablation within the last 3 months or DCCV within the last 30 days   Per office protocol, patient can hold Eliquis  for 2 days prior to procedure.  I will route this recommendation to the requesting party via Epic fax function and remove from pre-op pool.  Please call with questions.  Lamarr Satterfield, NP 11/17/2023, 8:09 AM

## 2023-11-18 DIAGNOSIS — I10 Essential (primary) hypertension: Secondary | ICD-10-CM | POA: Diagnosis not present

## 2023-11-18 DIAGNOSIS — R5381 Other malaise: Secondary | ICD-10-CM | POA: Diagnosis not present

## 2023-11-18 DIAGNOSIS — N390 Urinary tract infection, site not specified: Secondary | ICD-10-CM | POA: Diagnosis not present

## 2023-11-18 DIAGNOSIS — M810 Age-related osteoporosis without current pathological fracture: Secondary | ICD-10-CM | POA: Diagnosis not present

## 2023-11-18 DIAGNOSIS — D508 Other iron deficiency anemias: Secondary | ICD-10-CM | POA: Diagnosis not present

## 2023-11-18 DIAGNOSIS — M150 Primary generalized (osteo)arthritis: Secondary | ICD-10-CM | POA: Diagnosis not present

## 2023-11-18 DIAGNOSIS — R2689 Other abnormalities of gait and mobility: Secondary | ICD-10-CM | POA: Diagnosis not present

## 2023-11-18 DIAGNOSIS — G3184 Mild cognitive impairment, so stated: Secondary | ICD-10-CM | POA: Diagnosis not present

## 2023-11-18 DIAGNOSIS — I480 Paroxysmal atrial fibrillation: Secondary | ICD-10-CM | POA: Diagnosis not present

## 2023-11-20 ENCOUNTER — Other Ambulatory Visit: Payer: Self-pay | Admitting: Physical Medicine and Rehabilitation

## 2023-11-22 ENCOUNTER — Ambulatory Visit

## 2023-11-22 DIAGNOSIS — R55 Syncope and collapse: Secondary | ICD-10-CM | POA: Diagnosis not present

## 2023-11-23 LAB — CUP PACEART REMOTE DEVICE CHECK
Date Time Interrogation Session: 20250812102907
Implantable Pulse Generator Implant Date: 20220210
Pulse Gen Model: 436066
Pulse Gen Serial Number: 94061742

## 2023-11-23 NOTE — Progress Notes (Signed)
 Carelink Summary Report / Loop Recorder

## 2023-11-23 NOTE — Addendum Note (Signed)
 Addended by: VICCI SELLER A on: 11/23/2023 09:57 AM   Modules accepted: Orders

## 2023-11-26 ENCOUNTER — Ambulatory Visit: Attending: Cardiology | Admitting: Cardiology

## 2023-11-26 DIAGNOSIS — G3184 Mild cognitive impairment, so stated: Secondary | ICD-10-CM | POA: Diagnosis not present

## 2023-11-26 DIAGNOSIS — N390 Urinary tract infection, site not specified: Secondary | ICD-10-CM | POA: Diagnosis not present

## 2023-11-26 DIAGNOSIS — D508 Other iron deficiency anemias: Secondary | ICD-10-CM | POA: Diagnosis not present

## 2023-11-26 DIAGNOSIS — R2689 Other abnormalities of gait and mobility: Secondary | ICD-10-CM | POA: Diagnosis not present

## 2023-11-26 DIAGNOSIS — M810 Age-related osteoporosis without current pathological fracture: Secondary | ICD-10-CM | POA: Diagnosis not present

## 2023-11-26 DIAGNOSIS — M150 Primary generalized (osteo)arthritis: Secondary | ICD-10-CM | POA: Diagnosis not present

## 2023-11-26 DIAGNOSIS — I480 Paroxysmal atrial fibrillation: Secondary | ICD-10-CM | POA: Diagnosis not present

## 2023-11-26 DIAGNOSIS — R5381 Other malaise: Secondary | ICD-10-CM | POA: Diagnosis not present

## 2023-11-26 DIAGNOSIS — I10 Essential (primary) hypertension: Secondary | ICD-10-CM | POA: Diagnosis not present

## 2023-12-02 DIAGNOSIS — N3946 Mixed incontinence: Secondary | ICD-10-CM | POA: Diagnosis not present

## 2023-12-02 DIAGNOSIS — R351 Nocturia: Secondary | ICD-10-CM | POA: Diagnosis not present

## 2023-12-02 DIAGNOSIS — R350 Frequency of micturition: Secondary | ICD-10-CM | POA: Diagnosis not present

## 2023-12-06 DIAGNOSIS — R5381 Other malaise: Secondary | ICD-10-CM | POA: Diagnosis not present

## 2023-12-06 DIAGNOSIS — M810 Age-related osteoporosis without current pathological fracture: Secondary | ICD-10-CM | POA: Diagnosis not present

## 2023-12-06 DIAGNOSIS — N390 Urinary tract infection, site not specified: Secondary | ICD-10-CM | POA: Diagnosis not present

## 2023-12-06 DIAGNOSIS — R2689 Other abnormalities of gait and mobility: Secondary | ICD-10-CM | POA: Diagnosis not present

## 2023-12-06 DIAGNOSIS — I480 Paroxysmal atrial fibrillation: Secondary | ICD-10-CM | POA: Diagnosis not present

## 2023-12-06 DIAGNOSIS — G3184 Mild cognitive impairment, so stated: Secondary | ICD-10-CM | POA: Diagnosis not present

## 2023-12-06 DIAGNOSIS — I10 Essential (primary) hypertension: Secondary | ICD-10-CM | POA: Diagnosis not present

## 2023-12-06 DIAGNOSIS — D508 Other iron deficiency anemias: Secondary | ICD-10-CM | POA: Diagnosis not present

## 2023-12-06 DIAGNOSIS — M150 Primary generalized (osteo)arthritis: Secondary | ICD-10-CM | POA: Diagnosis not present

## 2023-12-19 ENCOUNTER — Ambulatory Visit: Payer: Self-pay | Admitting: Cardiology

## 2023-12-21 DIAGNOSIS — N3020 Other chronic cystitis without hematuria: Secondary | ICD-10-CM | POA: Diagnosis not present

## 2023-12-21 DIAGNOSIS — N3946 Mixed incontinence: Secondary | ICD-10-CM | POA: Diagnosis not present

## 2023-12-23 ENCOUNTER — Telehealth: Payer: Self-pay

## 2023-12-23 ENCOUNTER — Ambulatory Visit

## 2023-12-23 DIAGNOSIS — R55 Syncope and collapse: Secondary | ICD-10-CM

## 2023-12-23 NOTE — Telephone Encounter (Signed)
 Alert received from [employer] for episodes of HVR.  Pt has history of Afib with RVR.  Will contact Pt to determine if she has been symptomatic.  Longest episode below:  6 min 20 sec.  Pt has scheduled transmission that should be available for physician review tomorrow 12/24/2023 with complete report.  Outreach made to Pt.     Per Pt she denies any palpitations, shortness of breath or general fatigue.  She states she takes her diltiazem  180 mg daily as prescribed.  Advised Dr. Kennyth will be reviewing her monthly report within a few days and will contact Pt if any changes recommended.

## 2023-12-24 LAB — CUP PACEART REMOTE DEVICE CHECK
Date Time Interrogation Session: 20250911095650
Implantable Pulse Generator Implant Date: 20220210
Pulse Gen Model: 436066
Pulse Gen Serial Number: 94061742

## 2023-12-27 DIAGNOSIS — R350 Frequency of micturition: Secondary | ICD-10-CM | POA: Diagnosis not present

## 2023-12-27 DIAGNOSIS — N3946 Mixed incontinence: Secondary | ICD-10-CM | POA: Diagnosis not present

## 2023-12-27 DIAGNOSIS — N3020 Other chronic cystitis without hematuria: Secondary | ICD-10-CM | POA: Diagnosis not present

## 2023-12-31 ENCOUNTER — Telehealth: Payer: Self-pay

## 2023-12-31 NOTE — Progress Notes (Signed)
 Remote Loop Recorder Transmission

## 2023-12-31 NOTE — Telephone Encounter (Addendum)
 Alert received from [employer] for bradycardia episode 12/30/2023 at 11:57 am.  Outreach made to Pt.  Left message requesting call back to assess for symptoms.  Loop implanted for near syncope/PAF.

## 2024-01-03 ENCOUNTER — Ambulatory Visit: Payer: Self-pay | Admitting: Cardiology

## 2024-01-03 NOTE — Telephone Encounter (Signed)
 Patient was asymptomatic.   Patient wakes up around 8:30 AM, nap around 2PM and goes to bed at 9:30 pm.   Patient aware to call in the future if any symptoms arise. Pt voiced understanding and appreciative of call.

## 2024-01-07 NOTE — Progress Notes (Signed)
 Remote Loop Recorder Transmission

## 2024-01-11 ENCOUNTER — Encounter: Payer: Self-pay | Admitting: Podiatry

## 2024-01-11 ENCOUNTER — Ambulatory Visit (INDEPENDENT_AMBULATORY_CARE_PROVIDER_SITE_OTHER): Admitting: Podiatry

## 2024-01-11 ENCOUNTER — Ambulatory Visit (INDEPENDENT_AMBULATORY_CARE_PROVIDER_SITE_OTHER)

## 2024-01-11 DIAGNOSIS — N3281 Overactive bladder: Secondary | ICD-10-CM | POA: Insufficient documentation

## 2024-01-11 DIAGNOSIS — J309 Allergic rhinitis, unspecified: Secondary | ICD-10-CM | POA: Insufficient documentation

## 2024-01-11 DIAGNOSIS — M159 Polyosteoarthritis, unspecified: Secondary | ICD-10-CM | POA: Insufficient documentation

## 2024-01-11 DIAGNOSIS — E7800 Pure hypercholesterolemia, unspecified: Secondary | ICD-10-CM | POA: Insufficient documentation

## 2024-01-11 DIAGNOSIS — M8580 Other specified disorders of bone density and structure, unspecified site: Secondary | ICD-10-CM | POA: Insufficient documentation

## 2024-01-11 DIAGNOSIS — R7303 Prediabetes: Secondary | ICD-10-CM | POA: Insufficient documentation

## 2024-01-11 DIAGNOSIS — R32 Unspecified urinary incontinence: Secondary | ICD-10-CM | POA: Insufficient documentation

## 2024-01-11 DIAGNOSIS — Z860100 Personal history of colon polyps, unspecified: Secondary | ICD-10-CM | POA: Insufficient documentation

## 2024-01-11 DIAGNOSIS — S9032XA Contusion of left foot, initial encounter: Secondary | ICD-10-CM

## 2024-01-11 DIAGNOSIS — M2140 Flat foot [pes planus] (acquired), unspecified foot: Secondary | ICD-10-CM | POA: Insufficient documentation

## 2024-01-11 DIAGNOSIS — M19079 Primary osteoarthritis, unspecified ankle and foot: Secondary | ICD-10-CM

## 2024-01-11 DIAGNOSIS — K5900 Constipation, unspecified: Secondary | ICD-10-CM | POA: Insufficient documentation

## 2024-01-11 DIAGNOSIS — E0590 Thyrotoxicosis, unspecified without thyrotoxic crisis or storm: Secondary | ICD-10-CM | POA: Insufficient documentation

## 2024-01-11 DIAGNOSIS — Z85820 Personal history of malignant melanoma of skin: Secondary | ICD-10-CM | POA: Insufficient documentation

## 2024-01-11 DIAGNOSIS — M2042 Other hammer toe(s) (acquired), left foot: Secondary | ICD-10-CM | POA: Diagnosis not present

## 2024-01-11 DIAGNOSIS — E559 Vitamin D deficiency, unspecified: Secondary | ICD-10-CM | POA: Insufficient documentation

## 2024-01-11 DIAGNOSIS — Z96651 Presence of right artificial knee joint: Secondary | ICD-10-CM | POA: Insufficient documentation

## 2024-01-11 DIAGNOSIS — M25519 Pain in unspecified shoulder: Secondary | ICD-10-CM | POA: Insufficient documentation

## 2024-01-11 DIAGNOSIS — R600 Localized edema: Secondary | ICD-10-CM | POA: Insufficient documentation

## 2024-01-11 DIAGNOSIS — N939 Abnormal uterine and vaginal bleeding, unspecified: Secondary | ICD-10-CM | POA: Insufficient documentation

## 2024-01-11 DIAGNOSIS — Z8739 Personal history of other diseases of the musculoskeletal system and connective tissue: Secondary | ICD-10-CM | POA: Insufficient documentation

## 2024-01-11 DIAGNOSIS — Z791 Long term (current) use of non-steroidal anti-inflammatories (NSAID): Secondary | ICD-10-CM | POA: Insufficient documentation

## 2024-01-11 DIAGNOSIS — L03119 Cellulitis of unspecified part of limb: Secondary | ICD-10-CM | POA: Insufficient documentation

## 2024-01-11 DIAGNOSIS — M19019 Primary osteoarthritis, unspecified shoulder: Secondary | ICD-10-CM | POA: Insufficient documentation

## 2024-01-11 DIAGNOSIS — F3342 Major depressive disorder, recurrent, in full remission: Secondary | ICD-10-CM | POA: Insufficient documentation

## 2024-01-11 DIAGNOSIS — R2689 Other abnormalities of gait and mobility: Secondary | ICD-10-CM | POA: Insufficient documentation

## 2024-01-11 DIAGNOSIS — N3946 Mixed incontinence: Secondary | ICD-10-CM | POA: Insufficient documentation

## 2024-01-11 DIAGNOSIS — Z6827 Body mass index (BMI) 27.0-27.9, adult: Secondary | ICD-10-CM | POA: Insufficient documentation
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
## 2024-01-20 NOTE — Progress Notes (Signed)
 Remote Loop Recorder Transmission

## 2024-01-24 ENCOUNTER — Ambulatory Visit (INDEPENDENT_AMBULATORY_CARE_PROVIDER_SITE_OTHER)

## 2024-01-24 DIAGNOSIS — R55 Syncope and collapse: Secondary | ICD-10-CM

## 2024-01-24 DIAGNOSIS — I480 Paroxysmal atrial fibrillation: Secondary | ICD-10-CM | POA: Diagnosis not present

## 2024-01-24 DIAGNOSIS — D649 Anemia, unspecified: Secondary | ICD-10-CM | POA: Diagnosis not present

## 2024-01-24 DIAGNOSIS — M810 Age-related osteoporosis without current pathological fracture: Secondary | ICD-10-CM | POA: Diagnosis not present

## 2024-01-24 DIAGNOSIS — R7303 Prediabetes: Secondary | ICD-10-CM | POA: Diagnosis not present

## 2024-01-24 DIAGNOSIS — E7800 Pure hypercholesterolemia, unspecified: Secondary | ICD-10-CM | POA: Diagnosis not present

## 2024-01-24 DIAGNOSIS — Z23 Encounter for immunization: Secondary | ICD-10-CM | POA: Diagnosis not present

## 2024-01-24 DIAGNOSIS — I10 Essential (primary) hypertension: Secondary | ICD-10-CM | POA: Diagnosis not present

## 2024-01-24 DIAGNOSIS — D508 Other iron deficiency anemias: Secondary | ICD-10-CM | POA: Diagnosis not present

## 2024-01-24 DIAGNOSIS — E559 Vitamin D deficiency, unspecified: Secondary | ICD-10-CM | POA: Diagnosis not present

## 2024-01-24 DIAGNOSIS — Z7901 Long term (current) use of anticoagulants: Secondary | ICD-10-CM | POA: Diagnosis not present

## 2024-01-24 DIAGNOSIS — E039 Hypothyroidism, unspecified: Secondary | ICD-10-CM | POA: Diagnosis not present

## 2024-01-24 LAB — CUP PACEART REMOTE DEVICE CHECK
Date Time Interrogation Session: 20251013101112
Implantable Pulse Generator Implant Date: 20220210
Pulse Gen Model: 436066
Pulse Gen Serial Number: 94061742

## 2024-01-25 NOTE — Progress Notes (Signed)
 Remote Loop Recorder Transmission

## 2024-01-28 ENCOUNTER — Telehealth: Payer: Self-pay | Admitting: *Deleted

## 2024-01-28 NOTE — Telephone Encounter (Signed)
 Alert received from [employer] about bradycardic episodes during waking hours  Will call patient to assess for symptoms after 0800 since device placed for unexplained syncope  Routing to device pool for follow up

## 2024-01-28 NOTE — Telephone Encounter (Signed)
 Called patient to assess for symptoms during bradycardic episodes  Longest RR interval 1633 mS = 37 bpm  Patient denied experiencing any dizziness, lightheadedness, or blurred/loss of vision during events on 10/16  ILR implanted for unexplained syncope  Episodes occurred during waking hours which prompted the call  Patient and spouse did state that she has had syncopal, or near syncopal episodes since her device was implanted back in 2022 and there has been no discussion from their providers about the possible need for a permanent pacemaker and they would appreciate a conversation regarding this issue  Patient stated they are leaving for the beach for a week   This RN instructed the patient to bring her bedside monitor and a BP cuff with her and reviewed ED precautions while at the beach  Patient and spouse both verbalized understanding of instructions provided by this RN  All questions and concerns addressed at this time  Patient and spouse both very appreciative of the phone call  Routing to JP, MD for review and awareness

## 2024-01-31 ENCOUNTER — Telehealth: Payer: Self-pay

## 2024-01-31 NOTE — Telephone Encounter (Signed)
 [employer] alert: HVR episodes Noted increase in HVR episode burden and elevation in mean heart rates.  Longest duration 5 minutes 34 secs rates: 172-193 bpm  Hx of AF/RVR - on Eliquis , Diltiazem  180mg  every day, Metoprolol  succinate 25mg  qd Patient currently at the beach.   Events noted 10/18 and 10/19.  Patient states that she got very nauseated and was throwing up 10/19 correlating with events.Was asleep during 10/18 events. Also was rushing getting ready to leave for the beach on 10/19.   She forgot all of her medications and has been off her rate control meds for a few days.   She has no further symptoms since early Sunday morning and feels great now.  Plans to restart her medication tomorrow (her family is bringing it) and has our device clinic number to call if she has any further symptoms.

## 2024-02-06 ENCOUNTER — Ambulatory Visit: Payer: Self-pay | Admitting: Cardiology

## 2024-02-10 DIAGNOSIS — M810 Age-related osteoporosis without current pathological fracture: Secondary | ICD-10-CM | POA: Diagnosis not present

## 2024-02-10 DIAGNOSIS — Z85820 Personal history of malignant melanoma of skin: Secondary | ICD-10-CM | POA: Diagnosis not present

## 2024-02-10 DIAGNOSIS — G3184 Mild cognitive impairment, so stated: Secondary | ICD-10-CM | POA: Diagnosis not present

## 2024-02-10 DIAGNOSIS — K219 Gastro-esophageal reflux disease without esophagitis: Secondary | ICD-10-CM | POA: Diagnosis not present

## 2024-02-10 DIAGNOSIS — L821 Other seborrheic keratosis: Secondary | ICD-10-CM | POA: Diagnosis not present

## 2024-02-11 ENCOUNTER — Encounter: Attending: Physical Medicine and Rehabilitation | Admitting: Physical Medicine and Rehabilitation

## 2024-02-11 ENCOUNTER — Encounter: Payer: Self-pay | Admitting: Physical Medicine and Rehabilitation

## 2024-02-11 VITALS — BP 130/81 | HR 66 | Ht 65.0 in | Wt 160.8 lb

## 2024-02-11 DIAGNOSIS — M25551 Pain in right hip: Secondary | ICD-10-CM | POA: Diagnosis not present

## 2024-02-11 MED ORDER — MEMANTINE HCL 5 MG PO TABS: 5.0000 mg | ORAL_TABLET | Freq: Two times a day (BID) | ORAL | 3 refills | Status: AC

## 2024-02-11 NOTE — Progress Notes (Addendum)
 "  Subjective:    [name], female    DOB: 18-Jul-1940, 83 y.o.   MRN: [id number]  HPI Cynthia [name] is an 83 year old woman who presents to establish care for right little toe, right hip, and left knee pain.  1) Right hip pain: -maybe a little worse -stretching and exercising have been most helpful -lidocaine  patches have been helpful -home PT has not been coming -has been present for a long time -when her pain is severe she uses 2 tylenol  and tramadol  -it discourages her from getting up and walking around -she did PT a long time ago -until fairly recently she was in a weekly exercise group  2) Left knee pain: -has never tried a gluten elimination diet -started bothering her about 3 months ago -she feels that has contributed to declining mobility -uses walker when going to appointments -knee pain is not getting worse  3) Left ankle pain: -turned the ankle a a couple of weeks ago -eversion hurts -got an XR and there was no fracture -she is elevating   4) Low back pain:  -forgets lidocaine  is there  Pain Inventory Average Pain 6 Pain Right Now 5 My pain is dull and aching  In the last 24 hours, has pain interfered with the following? General activity 4 Relation with others 0 Enjoyment of life 5 What TIME of day is your pain at its worst? daytime and night Sleep (in general) Poor  Pain is worse with: standing Pain improves with: rest, heat/ice, therapy/exercise, and medication Relief from Meds: 8       Family History  Problem Relation Age of Onset   Congestive Heart Failure Mother    Heart disease Father        History of heart attacks at a later age.     Heart disease Brother    Hearing loss Brother    Social History   Socioeconomic History   Marital status: Married    Spouse name: Not on file   Number of children: 2   Years of education: Not on file   Highest education level: Not on file  Occupational History   Not on file  Tobacco  Use   Smoking status: Former    Current packs/day: 0.00    Types: Cigarettes    Start date: 77    Quit date: 1980    Years since quitting: 45.8   Smokeless tobacco: Never   Tobacco comments:    11/10/2017 only smoked when I was on my period  Vaping Use   Vaping status: Never Used  Substance and Sexual Activity   Alcohol  use: Yes    Alcohol /week: 1.0 standard drink of alcohol     Types: 1 Glasses of wine per week    Comment: wine on occassionally   Drug use: Never   Sexual activity: Not Currently  Other Topics Concern   Not on file  Social History Narrative   Right handed    Social Drivers of Health   Financial Resource Strain: Not on file  Food Insecurity: Unknown (04/07/2023)   Hunger Vital Sign    Worried About Running Out of Food in the Last Year: Not on file    Ran Out of Food in the Last Year: Never true  Transportation Needs: No Transportation Needs (04/07/2023)   PRAPARE - Administrator, Civil Service (Medical): No    Lack of Transportation (Non-Medical): No  Physical Activity: Not on file  Stress: Not  on file  Social Connections: Unknown (08/16/2021)   [facility]   Social Network    Social Network: Not on file   Past Surgical History:  Procedure Laterality Date   ABDOMINAL HYSTERECTOMY     partial   APPENDECTOMY     BIOPSY  11/17/2017   Procedure: BIOPSY;  Surgeon: Rosalie Kitchens, MD;  Location: [facility];  Service: Endoscopy;;   COLONOSCOPY WITH PROPOFOL  N/A 11/17/2017   Procedure: COLONOSCOPY WITH PROPOFOL ;  Surgeon: Rosalie Kitchens, MD;  Location: [facility];  Service: Endoscopy;  Laterality: N/A;   CRANIECTOMY FOR EXCISION OF ACOUSTIC NEUROMA     ESOPHAGOGASTRODUODENOSCOPY (EGD) WITH PROPOFOL  N/A 10/28/2017   Procedure: ESOPHAGOGASTRODUODENOSCOPY (EGD) WITH PROPOFOL ;  Surgeon: Rosalie Kitchens, MD;  Location: [facility];  Service: Endoscopy;  Laterality: N/A;   FOOT SURGERY Bilateral 04/18/2020   KNEE ARTHROPLASTY Right 06/05/2021    Procedure: COMPUTER ASSISTED TOTAL KNEE ARTHROPLASTY;  Surgeon: Fidel Rogue, MD;  Location: [facility];  Service: Orthopedics;  Laterality: Right;   KNEE ARTHROSCOPY Left    LEFT HEART CATH AND CORONARY ANGIOGRAPHY N/A 11/24/2016   Procedure: LEFT HEART CATH AND CORONARY ANGIOGRAPHY;  Surgeon: Dann Candyce RAMAN, MD;  Location: [facility] INVASIVE CV LAB;  Service: Cardiovascular;  Laterality: N/A;   MELANOMA EXCISION     off my back   NASAL SINUS SURGERY     TONSILLECTOMY     Past Medical History:  Diagnosis Date   Arthritis    joints (11/10/2017)   Dysrhythmia    afib   GERD (gastroesophageal reflux disease)    GI bleed 11/06/2017   Hearing loss    History of blood transfusion 10/2017   Hyperlipidemia    Hypertension    Hyperthyroidism    Loop recorder [employer] Loop Recorder 05/23/2020 05/23/2020   Melanoma (HCC)    cut off my back   Memory loss    Pneumonia    Sleep apnea    cpap   BP 130/81   Pulse 66   Ht 5' 5 (1.651 m)   Wt 160 lb 12.8 oz (72.9 kg)   SpO2 98%   BMI 26.76 kg/m   Opioid Risk Score:   Fall Risk Score:  `1  Depression screen [facility] 2/9     11/08/2023   11:20 AM 11/08/2023   11:18 AM 11/08/2023   11:12 AM 08/06/2023   11:06 AM 01/03/2016   11:20 AM 01/03/2016   11:19 AM 08/14/2015   11:45 AM  Depression screen PH[facility] 2/9  Decreased Interest 0 1 0 0 0 0 0  Down, Depressed, Hopeless 0 1 0 0 0 0 0  PHQ - 2 Score 0 2 0 0 0 0 0      Review of Systems  Gastrointestinal:  Positive for abdominal pain.  Musculoskeletal:  Positive for back pain and gait problem.  All other systems reviewed and are negative.      Objective:   Physical Exam Gen: no distress, normal appearing HEENT: oral mucosa pink and moist, NCAT Cardio: Reg rate Chest: normal effort, normal rate of breathing Abd: soft, non-distended Ext: no edema Psych: pleasant, normal affect Skin: intact Neuro: Alert and oriented x3, TTP right buttock and over trochanteric bursitis, TTP in  left posterior fossa of knee, clicking in left knee, impaired short term memory MSK: stable       Assessment & Plan:   1) Right hip pain:  -continue red light therapy -new home PT orders placed  Hip heat and cold ROM  orthosis ordered for instability of the hip  Discussed extracorporeal shockwave therapy as a modality for treatment. Discussed that the device looks and feels like a massage gun and I would move it over the area of pain for about 10 minutes. The device releases sound waves to the area of pain and helps to improve blood flow and circulation to improve the healing process. Discuss that this initially induces inflammation and can sometimes cause short-term increase in pain. Discussed that we typically do three weekly treatments, but sometimes up to 6 if needed, and after 6 weeks long term benefits can sometimes be achieved. Discussed that this is an FDA approved device, but not covered by insurance and would cost $60 per session. Will scheduled patient for 6 consecutive appointments and can cancel latter three if benefits are achieved after first three sessions.    -prescribed namenda  5mg  BID  -discussed that it seems to be secondary to bursitis -referred to physical therapy for strengthening of glut med and min -continue tylenol  and tramadol  prn -Discussed current symptoms of pain and history of pain.  -Discussed benefits of exercise in reducing pain. -Discussed following foods that may reduce pain: -Discussed current symptoms of pain and history of pain.  -Discussed benefits of exercise in reducing pain. -Discussed following foods that may reduce pain: 1) Ginger (especially studied for arthritis)- reduce leukotriene production to decrease inflammation 2) Blueberries- high in phytonutrients that decrease inflammation 3) Salmon- marine omega-3s reduce joint swelling and pain 4) Pumpkin seeds- reduce inflammation 5) dark chocolate- reduces inflammation 6) turmeric- reduces  inflammation 7) tart cherries - reduce pain and stiffness 8) extra virgin olive oil - its compound olecanthal helps to block prostaglandins  9) chili peppers- can be eaten or applied topically via capsaicin 10) mint- helpful for headache, muscle aches, joint pain, and itching 11) garlic- reduces inflammation 12) Green tea- reduces inflammation and oxidative stress, helps with weight loss, may reduce the risk of cancer, recommend Double [employer] of Tea daily  Link to further information on diet for chronic pain: http://www.bray.com/   2) Right buttock pain/sciatica/neuropathy: -failed lidocaine  patch, PT, Tylenol , Tramadol  -called [employer] to appeal decision of Qutenza denial, discussed that this patch has been studied for this condition in [place] -discussed with patient and husband that they feel unfair that the patch was denied for Cynthia [name] while it was approved for Mr. Purdon for the same condition  -discussed benefits of turmeric  -Discussed Qutenza as an option for neuropathic pain control. Discussed that this is a capsaicin patch, stronger than capsaicin cream. Discussed that it is currently approved for diabetic peripheral neuropathy and post-herpetic neuralgia, but that it has also shown benefit in treating other forms of neuropathy. Provided patient with link to site to learn more about the patch: https://www.clark.biz/. Discussed that the patch would be placed in office and benefits usually last 3 months. Discussed that unintended exposure to capsaicin can cause severe irritation of eyes, mucous membranes, respiratory tract, and skin, but that Qutenza is a local treatment and does not have the systemic side effects of other nerve medications. Discussed that there may be pain, itching, erythema, and decreased sensory function associated with the application of Qutenza. Side effects usually subside within 1  week. A cold pack of analgesic medications can help with these side effects. Blood pressure can also be increased due to pain associated with administration of the patch.   Prescribing Home Zynex NexWave Stimulator Device and supplies as needed. IFC, NMES and TENS  medically necessary Treatment Rx: Daily @ 30-40 minutes per treatment PRN. Zynex NexWave only, no substitutions. Treatment Goals: 1) To reduce and/or eliminate pain 2) To improve functional capacity and Activities of daily living 3) To reduce or prevent the need for oral medications 4) To improve circulation in the injured region 5) To decrease or prevent muscle spasm and muscle atrophy 6) To provide a self-management tool to the patient The patient has not sufficiently improved with conservative care. Numerous studies indexed by Medline and PubMed.gov have shown Neuromuscular, Interferential, and TENS stimulators to reduce pain, improve function, and reduce medication use in injured patients. Continued use of this evidence based, safe, drug free treatment is both reasonable and medically necessary at this time.   3) Left knee pain -discussed present in posterior fossa and can be due to gait impairment stemming from her right hip/buttock pain -recommended daily turmeric with black pepper -discussed ref light therapy continue voltaren -recommended knee  -recommended coconut oil with frankincense  4) Left ankle pain: -advised wearing compression garment Continue PT -Discussed current symptoms of pain and history of pain.  -Discussed benefits of exercise in reducing pain. -Discussed following foods that may reduce pain: 1) Ginger (especially studied for arthritis)- reduce leukotriene production to decrease inflammation 2) Blueberries- high in phytonutrients that decrease inflammation 3) Salmon- marine omega-3s reduce joint swelling and pain 4) Pumpkin seeds- reduce inflammation 5) dark chocolate- reduces inflammation 6) turmeric-  reduces inflammation 7) tart cherries - reduce pain and stiffness 8) extra virgin olive oil - its compound olecanthal helps to block prostaglandins  9) chili peppers- can be eaten or applied topically via capsaicin 10) mint- helpful for headache, muscle aches, joint pain, and itching 11) garlic- reduces inflammation 12) Green tea- reduces inflammation and oxidative stress, helps with weight loss, may reduce the risk of cancer, recommend Double [employer] of Tea daily  Link to further information on diet for chronic pain: http://www.bray.com/  -discussed that Journavx  is a highly selective inhibitor for Nav 1.8, which is specific for pain in the peripheral nervous system, discussed that lidocaine  in contrast affects all Nav receptors, discussed loading dose is 2 50mg  tablets and this can be taken 1 hour before food, discussed that then 50mg  can be taken with out without food q12H until pain is tolerable. Discussed that potential side effects are pruritus, muscle spasms, increase blood creatinine, rash. Discussed that we have samples available and we have copay cards available. Discussed that the 2 phase 3 clinicial trials sent to the FDA were for abdominoplasty and bunionectomy, discussed that it has been currently studied for 14 days, discussed that it can reduce efficacy of opioids and benzodiazepines, discussed that it has not be studied in severe hepatic impairment. Discussed that it is contraindicated with fluconazole. Discussed that it can interfere with hormonal contraception up to 28 days after use. Discussed that it can decrease fertility, it has not been studied in pregnancy, that it has been present in animal milk during lactation, not recommended for GFR <15, discussed that outpatient if the medication requires a prior auth the copay should be $30 for at least a 60 day supply. The medication may be more likely to be  in stock in [facility] and [facility]. We do have samples available   5) Impaired cognition: -continue namenda  5mg  BID -continue turmeric -recommended wild blueberries -recommended avoiding gluten "

## 2024-02-18 DIAGNOSIS — I130 Hypertensive heart and chronic kidney disease with heart failure and stage 1 through stage 4 chronic kidney disease, or unspecified chronic kidney disease: Secondary | ICD-10-CM | POA: Diagnosis not present

## 2024-02-18 DIAGNOSIS — D6869 Other thrombophilia: Secondary | ICD-10-CM | POA: Diagnosis not present

## 2024-02-18 DIAGNOSIS — I25119 Atherosclerotic heart disease of native coronary artery with unspecified angina pectoris: Secondary | ICD-10-CM | POA: Diagnosis not present

## 2024-02-18 DIAGNOSIS — I4892 Unspecified atrial flutter: Secondary | ICD-10-CM | POA: Diagnosis not present

## 2024-02-18 DIAGNOSIS — I4891 Unspecified atrial fibrillation: Secondary | ICD-10-CM | POA: Diagnosis not present

## 2024-02-18 DIAGNOSIS — I495 Sick sinus syndrome: Secondary | ICD-10-CM | POA: Diagnosis not present

## 2024-02-18 DIAGNOSIS — I509 Heart failure, unspecified: Secondary | ICD-10-CM | POA: Diagnosis not present

## 2024-02-18 DIAGNOSIS — I700 Atherosclerosis of aorta: Secondary | ICD-10-CM | POA: Diagnosis not present

## 2024-02-18 DIAGNOSIS — F325 Major depressive disorder, single episode, in full remission: Secondary | ICD-10-CM | POA: Diagnosis not present

## 2024-02-24 ENCOUNTER — Ambulatory Visit

## 2024-02-24 DIAGNOSIS — K2100 Gastro-esophageal reflux disease with esophagitis, without bleeding: Secondary | ICD-10-CM | POA: Diagnosis not present

## 2024-02-24 DIAGNOSIS — R55 Syncope and collapse: Secondary | ICD-10-CM | POA: Diagnosis not present

## 2024-02-24 LAB — CUP PACEART REMOTE DEVICE CHECK
Date Time Interrogation Session: 20251113071734
Implantable Pulse Generator Implant Date: 20220210
Pulse Gen Model: 436066
Pulse Gen Serial Number: 94061742

## 2024-02-27 ENCOUNTER — Ambulatory Visit: Payer: Self-pay | Admitting: Cardiology

## 2024-02-29 NOTE — Progress Notes (Signed)
 Remote Loop Recorder Transmission

## 2024-03-16 DIAGNOSIS — Z7901 Long term (current) use of anticoagulants: Secondary | ICD-10-CM | POA: Diagnosis not present

## 2024-03-16 DIAGNOSIS — K921 Melena: Secondary | ICD-10-CM | POA: Diagnosis not present

## 2024-03-27 ENCOUNTER — Ambulatory Visit

## 2024-03-27 DIAGNOSIS — R55 Syncope and collapse: Secondary | ICD-10-CM

## 2024-03-28 ENCOUNTER — Telehealth: Payer: Self-pay | Admitting: Neurology

## 2024-03-28 NOTE — Telephone Encounter (Addendum)
 Patient's daughter, Channing North, patient experiencing lot of nasal drainage for months. Have an appointment schedule with PCP Thursday 03/30/24.  Informed Ms. Pressley to have PCP sent over a referral if finds neurological issues

## 2024-03-29 LAB — CUP PACEART REMOTE DEVICE CHECK
Date Time Interrogation Session: 20251215120904
Implantable Pulse Generator Implant Date: 20220210
Pulse Gen Model: 436066
Pulse Gen Serial Number: 94061742

## 2024-03-31 ENCOUNTER — Ambulatory Visit: Payer: Self-pay | Admitting: Cardiology

## 2024-03-31 NOTE — Progress Notes (Signed)
 Remote Loop Recorder Transmission

## 2024-04-19 ENCOUNTER — Telehealth: Payer: Self-pay | Admitting: Neurology

## 2024-04-19 NOTE — Telephone Encounter (Signed)
 Pt's husband reports pt's memory is worsening, he accepted Dr Duncan next available an appointment is on wait list, he would like a call from RN to discuss anything that could be suggested while waiting to be seen

## 2024-04-20 ENCOUNTER — Encounter: Attending: Physical Medicine and Rehabilitation | Admitting: Physical Medicine and Rehabilitation

## 2024-04-20 DIAGNOSIS — M25551 Pain in right hip: Secondary | ICD-10-CM | POA: Insufficient documentation

## 2024-04-20 NOTE — Telephone Encounter (Signed)
 I called husband.  Pt noted with worsening memory. Not on donepezil , but on memantine  5mg  po bid by CPMR MD.  She was seen 04-18-2024 at pcp and has UIT, being treated.  I relayed that any acute change in behavior, confusion, worsening memory sx can cause be caused by infection.  She is on ABX. Remain hydrated,   She has appt 11-2024, and is on waitlist.  They appreciated call back.

## 2024-04-25 ENCOUNTER — Telehealth: Payer: Self-pay

## 2024-04-25 NOTE — Telephone Encounter (Signed)
 Qutenza approved 04/13/2024-04/12/2025 Ref# [id number] [facility]

## 2024-04-27 ENCOUNTER — Encounter: Admitting: Physical Medicine and Rehabilitation

## 2024-04-27 ENCOUNTER — Ambulatory Visit: Attending: Cardiology

## 2024-04-27 DIAGNOSIS — R55 Syncope and collapse: Secondary | ICD-10-CM

## 2024-04-27 LAB — CUP PACEART REMOTE DEVICE CHECK
Date Time Interrogation Session: 20260115092539
Implantable Pulse Generator Implant Date: 20220210
Pulse Gen Model: 436066
Pulse Gen Serial Number: 94061742

## 2024-05-01 ENCOUNTER — Ambulatory Visit: Payer: Self-pay | Admitting: Cardiology

## 2024-05-02 ENCOUNTER — Encounter: Admitting: Physical Medicine and Rehabilitation

## 2024-05-02 VITALS — BP 161/97 | HR 67

## 2024-05-02 NOTE — Progress Notes (Signed)
 Appointment canceled since ECSWT machine was malfunctioning   Right hip heat and cold ROM orthosis ordered for instability of the hip

## 2024-05-04 ENCOUNTER — Other Ambulatory Visit: Payer: Self-pay | Admitting: Nurse Practitioner

## 2024-05-04 ENCOUNTER — Encounter: Admitting: Physical Medicine and Rehabilitation

## 2024-05-04 ENCOUNTER — Telehealth: Payer: Self-pay | Admitting: Physical Medicine and Rehabilitation

## 2024-05-04 ENCOUNTER — Other Ambulatory Visit: Payer: Self-pay | Admitting: Cardiology

## 2024-05-04 ENCOUNTER — Other Ambulatory Visit: Payer: Self-pay | Admitting: Physical Medicine and Rehabilitation

## 2024-05-04 DIAGNOSIS — I480 Paroxysmal atrial fibrillation: Secondary | ICD-10-CM

## 2024-05-04 DIAGNOSIS — E782 Mixed hyperlipidemia: Secondary | ICD-10-CM

## 2024-05-04 DIAGNOSIS — M25551 Pain in right hip: Secondary | ICD-10-CM

## 2024-05-04 NOTE — Progress Notes (Signed)
 Remote Loop Recorder Transmission

## 2024-05-04 NOTE — Telephone Encounter (Signed)
 Would like a referral to ortho for Shockwave.

## 2024-05-11 ENCOUNTER — Encounter: Admitting: Physical Medicine and Rehabilitation

## 2024-05-11 NOTE — Telephone Encounter (Signed)
 Labs Completed on 3/31//25

## 2024-05-17 ENCOUNTER — Ambulatory Visit: Admitting: Sports Medicine

## 2024-05-17 ENCOUNTER — Other Ambulatory Visit: Payer: Self-pay

## 2024-05-17 ENCOUNTER — Encounter: Payer: Self-pay | Admitting: Sports Medicine

## 2024-05-17 DIAGNOSIS — M47816 Spondylosis without myelopathy or radiculopathy, lumbar region: Secondary | ICD-10-CM | POA: Diagnosis not present

## 2024-05-17 DIAGNOSIS — R29898 Other symptoms and signs involving the musculoskeletal system: Secondary | ICD-10-CM | POA: Diagnosis not present

## 2024-05-17 DIAGNOSIS — M545 Low back pain, unspecified: Secondary | ICD-10-CM

## 2024-05-17 DIAGNOSIS — G8929 Other chronic pain: Secondary | ICD-10-CM | POA: Diagnosis not present

## 2024-05-17 DIAGNOSIS — M25551 Pain in right hip: Secondary | ICD-10-CM

## 2024-05-17 DIAGNOSIS — M5136 Other intervertebral disc degeneration, lumbar region with discogenic back pain only: Secondary | ICD-10-CM | POA: Diagnosis not present

## 2024-05-17 NOTE — Progress Notes (Signed)
 "  Cynthia [name] - 84 y.o. female MRN [id number]  Date of birth: 1940/09/02  Office Visit Note: Visit Date: 05/17/2024 PCP: Clarice Nottingham, MD Referred by: Lorilee Sven SQUIBB, MD  Subjective: Chief Complaint  Patient presents with   Right Hip - Pain   Lower Back - Pain   HPI: Cynthia [name] is a pleasant 84 y.o. female who presents today for acute on chronic right lateral hip pain, also with chronic low back pain.  Discussed the use of AI scribe software for clinical note transcription with the patient, who gave verbal consent to proceed.  History of Present Illness Jenkins is an 84 year old female who presents for evaluation of chronic right lateral hip pain with interest in shockwave therapy, also for chronic low back pain.  She has persistent right lateral hip pain over the lateral aspect, described as bruised or sore with a focal area of maximal tenderness. Pain has not improved with home physical therapy. She denies prior injections to the hip. Shockwave therapy was previously planned at Dr. Lennette office but delayed due to equipment malfunction.  Interested in proceeding with this today.  She does not believe she has had injection therapy in the past for this.  She also has longstanding low back pain, maximal in the middle and right lower back. Pain worsens with rising from a chair and with forward or backward bending and has led to a visible hitch in her gait per family. She denies lower extremity numbness or tingling.   She uses tramadol  50mg  PRN, moreso at night, Tylenol , and topical Voltaren as needed for pain control.  She is managed on Eliquis  unable to take NSAIDs.  Pertinent ROS were reviewed with the patient and found to be negative unless otherwise specified above in HPI.   Assessment & Plan: Visit Diagnoses:  1. Greater trochanteric pain syndrome of right lower extremity   2. Weakness of right hip   3. Degeneration of intervertebral disc of lumbar region with  discogenic back pain   4. Spondylosis of lumbar region without myelopathy or radiculopathy   5. Chronic bilateral low back pain without sciatica    Assessment & Plan Right greater trochanteric pain syndrome (gluteal tendinopathy and trochanteric bursitis) Chronic right lateral hip pain consistent with greater trochanteric pain syndrome and gluteal insufficiency. There is unilateral hip abduction weakness. Imaging shows minimal arthritic changes, pain localized to lateral aspect. Previous physical therapy ineffective, no prior hip injections. Shockwave therapy recommended for inflamed tendons and bursa. - Performed first treatment trial of shockwave therapy to the right lateral hip. - Instructed her to return in 1 week for a second session of shockwave therapy, and then at that point we will see what cumulative benefit she has going forward.  Could consider additional shockwave only visits following. - Advised post-treatment soreness may occur for one day; use ice, heat, acetaminophen , tramadol , or topical diclofenac as needed.  Chronic lumbar spondylosis with spondylolisthesis and degenerative disc disease Chronic low back pain due to lumbar spondylosis, degenerative disc disease, and mild spondylolisthesis. Symptoms exacerbated by movement and posture, refractory to physical therapy. No radicular symptoms. Further interventional options needed due to limited response to conservative management. -She has no radicular symptoms so I do not think there is concern for neural impingement.  Do think she might benefit further from facet based injections as opposed to true interlaminar injection but we will leave this up to further evaluation by Duwaine Pouch and Dr. Eldonna - Placed referral to  spine specialist (Dr. Eldonna and team) for evaluation and discussion of lumbar spine injections or other interventional options. - Advised her to continue current pain management regimen with Tramadol  50mg  every 6hrs  as needed and tylenol  - could consider formal PT, but limited improvement in past  *Additional considerations: Diagnostic work-up of hip (Ultrasound or MRI); consideration of lumbar facet injections or epidural injections -- will discuss with Dr. Burnis Pouch  Follow-up: Return for F/u in 7-10 days for R-lateral hip (reg visit).   Meds & Orders: No orders of the defined types were placed in this encounter.   Orders Placed This Encounter  Procedures   XR Lumbar Spine 2-3 Views   XR HIP UNILAT W OR W/O PELVIS 2-3 VIEWS RIGHT     Procedures: No procedures performed      Clinical History: No specialty comments available.  She reports that she quit smoking about 46 years ago. Her smoking use included cigarettes. She started smoking about 48 years ago. She has never used smokeless tobacco. No results for input(s): HGBA1C, LABURIC in the last 8760 hours.  Objective:    Physical Exam  Gen: Well-appearing, in no acute distress; non-toxic CV: Well-perfused. Warm.  Resp: Breathing unlabored on room air; no wheezing. Psych: Fluid speech in conversation; appropriate affect; normal thought process  *MSK/Ortho Exam: Physical Exam MUSCULOSKELETAL: Midline lumbar spine tenderness. Pain on forward flexion and extension of the lumbar spine. Kyphotic posture. Right hip pain on palpation. Inability to stand straight. Localized lumbar spine pain.  R-hip: + TTP over the greater trochanteric region and the insertion of the gluteal tendons.  No appreciable trochanteric bursitis.  Fluid logroll with internal and external motion.  There is weakness with resisted hip abduction on the right compared to full strength on the left.  Lumbar: + TTP in the midline and bilateral paraspinal musculature at the L4-L5 level.  There is restriction with flexion and extension.  Patient does ambulate with a forward/kyphotic posture.  There is pain with axial loading and endrange extension.  Imaging: XR HIP  UNILAT W OR W/O PELVIS 2-3 VIEWS RIGHT Result Date: 05/17/2024 2 view x-ray of the right hip including AP pelvis, right hip lateral femoral ordered and reviewed by myself today.  Femoral heads well-seated within the acetabulum.  There is no significant hip osteoarthritic change.  Very minimal spurring off the greater trochanteric region.  No acute fracture noted.  XR Lumbar Spine 2-3 Views Result Date: 05/17/2024 2 views of the lumbar spine including AP and lateral film were ordered and reviewed by myself today.  X-rays demonstrate a minimal degenerative scoliosis.  There is significant lumbar osteoarthritic change and degenerative disc disease.  There is a grade 1 L4 and L5 anterolisthesis.  There is near complete loss of intervertebral disc space at the L1-L2 and L2-L3 level with endplate sclerosis.  There is notable facet arthropathy at the L4-L5 region.   Past Medical/Family/Surgical/Social History: Medications & Allergies reviewed per EMR, new medications updated. Patient Active Problem List   Diagnosis Date Noted   Abnormal uterine bleeding 01/11/2024   Allergic rhinitis 01/11/2024   Cellulitis of lower limb 01/11/2024   Constipation 01/11/2024   Edema of lower extremity 01/11/2024   Generalized osteoarthritis 01/11/2024   History of colon polyps 01/11/2024   History of malignant melanoma of skin 01/11/2024   History of musculoskeletal disease 01/11/2024   Localized, primary osteoarthritis of shoulder region 01/11/2024   Long term (current) use of non-steroidal anti-inflammatories (nsaid) 01/11/2024   Recurrent major  depression in full remission 01/11/2024   Mixed stress and urge urinary incontinence 01/11/2024   Osteopenia 01/11/2024   Overactive bladder 01/11/2024   BMI 27.0-27.9,adult 01/11/2024   Pes planus 01/11/2024   Poor balance 01/11/2024   Prediabetes 01/11/2024   Pure hypercholesterolemia 01/11/2024   Shoulder pain 01/11/2024   Status post right knee replacement  01/11/2024   Subclinical hyperthyroidism 01/11/2024   Urinary incontinence 01/11/2024   Vaginal bleeding 01/11/2024   Vitamin D  deficiency 01/11/2024   Syncope 04/07/2023   Precordial pain 07/19/2022   Coronary artery calcification seen on computed tomography 07/19/2022   Aneurysm of ascending aorta without rupture 07/19/2022   Artificial knee joint present 06/20/2021   Lactose intolerance 06/20/2021   Degenerative arthritis of right knee 06/05/2021   Osteoarthritis of right knee 06/05/2021   Stress due to illness of family member 02/18/2021   Chronic low back pain 12/17/2020   Age-related osteoporosis without current pathological fracture 11/12/2020   Anxiety disorder 11/12/2020   Asymptomatic varicose veins of unspecified lower extremity 11/12/2020   Chronic fatigue syndrome 11/12/2020   Goiter 11/12/2020   Hearing loss 11/12/2020   History of appendectomy 11/12/2020   Iron deficiency anemia secondary to inadequate dietary iron intake 11/12/2020   Physical deconditioning 11/12/2020   Recurrent falls 11/12/2020   Uses hearing aid 11/12/2020   Varicose veins of both lower extremities 11/12/2020   NPH (normal pressure hydrocephalus) (HCC) 08/19/2020   Urinary urgency 08/19/2020   Encounter for loop recorder check 06/09/2020   Loop recorder Biotronik Loop Recorder 05/23/2020 05/23/2020   Paroxysmal atrial fibrillation (HCC) 03/24/2020   Gait disturbance 01/29/2020   Periodic limb movement disorder 08/30/2019   Increased frequency of urination 05/24/2019   Nocturia 07/01/2018   Midline cystocele 06/08/2018   Recurrent UTI 06/08/2018   Vaginal vault prolapse 06/08/2018   Syncope and collapse    Postural dizziness with presyncope 03/17/2018   PAF (paroxysmal atrial fibrillation) (HCC) 03/17/2018   Memory loss 11/25/2017   Abnormal stress test    Hyperlipidemia 11/23/2016   GERD (gastroesophageal reflux disease) 11/23/2016   History of recurrent UTIs 11/23/2016    Polyneuropathy 10/30/2016   Bursitis, subacromial 10/30/2016   Numbness 10/30/2016   Arthritis, senescent 01/03/2016   Pain in joint, ankle and foot 02/28/2015   Dry mouth 08/07/2014   Dry eyes 08/07/2014   Other headache syndrome 06/25/2014   Sinusitis, chronic 06/25/2014   Neck pain 06/25/2014   Obstructive sleep apnea 06/25/2014   Essential hypertension, benign 06/25/2014   Past Medical History:  Diagnosis Date   Arthritis    joints (11/10/2017)   Dysrhythmia    afib   GERD (gastroesophageal reflux disease)    GI bleed 11/06/2017   Hearing loss    History of blood transfusion 10/2017   Hyperlipidemia    Hypertension    Hyperthyroidism    Loop recorder Biotronik Loop Recorder 05/23/2020 05/23/2020   Melanoma (HCC)    cut off my back   Memory loss    Pneumonia    Sleep apnea    cpap   Family History  Problem Relation Age of Onset   Congestive Heart Failure Mother    Heart disease Father        History of heart attacks at a later age.     Heart disease Brother    Hearing loss Brother    Past Surgical History:  Procedure Laterality Date   ABDOMINAL HYSTERECTOMY     partial   APPENDECTOMY  BIOPSY  11/17/2017   Procedure: BIOPSY;  Surgeon: Rosalie Kitchens, MD;  Location: [facility];  Service: Endoscopy;;   COLONOSCOPY WITH PROPOFOL  N/A 11/17/2017   Procedure: COLONOSCOPY WITH PROPOFOL ;  Surgeon: Rosalie Kitchens, MD;  Location: [facility];  Service: Endoscopy;  Laterality: N/A;   CRANIECTOMY FOR EXCISION OF ACOUSTIC NEUROMA     ESOPHAGOGASTRODUODENOSCOPY (EGD) WITH PROPOFOL  N/A 10/28/2017   Procedure: ESOPHAGOGASTRODUODENOSCOPY (EGD) WITH PROPOFOL ;  Surgeon: Rosalie Kitchens, MD;  Location: [facility];  Service: Endoscopy;  Laterality: N/A;   FOOT SURGERY Bilateral 04/18/2020   KNEE ARTHROPLASTY Right 06/05/2021   Procedure: COMPUTER ASSISTED TOTAL KNEE ARTHROPLASTY;  Surgeon: Fidel Rogue, MD;  Location: [facility];  Service: Orthopedics;  Laterality: Right;   KNEE  ARTHROSCOPY Left    LEFT HEART CATH AND CORONARY ANGIOGRAPHY N/A 11/24/2016   Procedure: LEFT HEART CATH AND CORONARY ANGIOGRAPHY;  Surgeon: Dann Candyce RAMAN, MD;  Location: [facility] CV [facility];  Service: Cardiovascular;  Laterality: N/A;   MELANOMA EXCISION     off my back   NASAL SINUS SURGERY     TONSILLECTOMY     Social History   Occupational History   Not on file  Tobacco Use   Smoking status: Former    Current packs/day: 0.00    Types: Cigarettes    Start date: 13    Quit date: 1980    Years since quitting: 46.1   Smokeless tobacco: Never   Tobacco comments:    11/10/2017 only smoked when I was on my period  Vaping Use   Vaping status: Never Used  Substance and Sexual Activity   Alcohol  use: Yes    Alcohol /week: 1.0 standard drink of alcohol     Types: 1 Glasses of wine per week    Comment: wine on occassionally   Drug use: Never   Sexual activity: Not Currently   "

## 2024-05-17 NOTE — Progress Notes (Signed)
 Patient says that she has had right hip pain for years that has gotten worse over the last several months, as well as low back pain that has gotten worse in the last year. She says that the right hip primarily hurts over the side. She does have a history of right knee replacement. She takes Tylenol  and Tramadol  for her pain, which are helpful, and uses heat. She was referred for shockwave therapy. Patient says that she has had worsening aching in the low back, which is sometimes worse on the right side but does stretch across the entire lower back. She denies having any pain, numbness, or tingling in the legs. She gets relief with the Tylenol  and Tramadol  for her back pain, as well. Patient says that she has done physical therapy within the last year.

## 2024-05-18 ENCOUNTER — Encounter: Admitting: Physical Medicine and Rehabilitation

## 2024-05-25 ENCOUNTER — Ambulatory Visit: Admitting: Podiatry

## 2024-05-25 ENCOUNTER — Encounter: Admitting: Physical Medicine and Rehabilitation

## 2024-05-26 ENCOUNTER — Encounter: Admitting: Physical Medicine and Rehabilitation

## 2024-05-28 ENCOUNTER — Ambulatory Visit

## 2024-05-29 ENCOUNTER — Ambulatory Visit: Admitting: Sports Medicine

## 2024-05-29 ENCOUNTER — Encounter: Admitting: Physical Medicine and Rehabilitation

## 2024-06-05 ENCOUNTER — Encounter: Admitting: Physical Medicine and Rehabilitation

## 2024-06-28 ENCOUNTER — Ambulatory Visit

## 2024-07-29 ENCOUNTER — Ambulatory Visit

## 2024-08-29 ENCOUNTER — Ambulatory Visit

## 2024-09-29 ENCOUNTER — Ambulatory Visit

## 2024-10-30 ENCOUNTER — Ambulatory Visit

## 2024-11-30 ENCOUNTER — Ambulatory Visit

## 2024-11-30 ENCOUNTER — Ambulatory Visit: Admitting: Neurology

## 2024-12-31 ENCOUNTER — Ambulatory Visit

## 2025-01-31 ENCOUNTER — Ambulatory Visit
